# Patient Record
Sex: Female | Born: 1935
Health system: Southern US, Community
[De-identification: ages and names within clinical notes are randomized; demographics above are authoritative.]

## PROBLEM LIST (undated history)

## (undated) ENCOUNTER — Emergency Department (HOSPITAL_COMMUNITY): Payer: PPO

## (undated) DIAGNOSIS — E739 Lactose intolerance, unspecified: Secondary | ICD-10-CM

## (undated) DIAGNOSIS — I872 Venous insufficiency (chronic) (peripheral): Secondary | ICD-10-CM

## (undated) DIAGNOSIS — L259 Unspecified contact dermatitis, unspecified cause: Secondary | ICD-10-CM

## (undated) DIAGNOSIS — E669 Obesity, unspecified: Secondary | ICD-10-CM

## (undated) DIAGNOSIS — N951 Menopausal and female climacteric states: Secondary | ICD-10-CM

## (undated) DIAGNOSIS — M1990 Unspecified osteoarthritis, unspecified site: Secondary | ICD-10-CM

## (undated) DIAGNOSIS — I739 Peripheral vascular disease, unspecified: Secondary | ICD-10-CM

## (undated) DIAGNOSIS — C50919 Malignant neoplasm of unspecified site of unspecified female breast: Secondary | ICD-10-CM

## (undated) DIAGNOSIS — I6529 Occlusion and stenosis of unspecified carotid artery: Secondary | ICD-10-CM

## (undated) DIAGNOSIS — R609 Edema, unspecified: Secondary | ICD-10-CM

## (undated) DIAGNOSIS — M720 Palmar fascial fibromatosis [Dupuytren]: Secondary | ICD-10-CM

## (undated) DIAGNOSIS — E559 Vitamin D deficiency, unspecified: Secondary | ICD-10-CM

## (undated) DIAGNOSIS — E039 Hypothyroidism, unspecified: Secondary | ICD-10-CM

## (undated) DIAGNOSIS — E785 Hyperlipidemia, unspecified: Secondary | ICD-10-CM

## (undated) HISTORY — DX: Edema, unspecified: R60.9

## (undated) HISTORY — PX: TONSILLECTOMY: SUR1361

## (undated) HISTORY — DX: Malignant neoplasm of unspecified site of unspecified female breast: C50.919

## (undated) HISTORY — DX: Lactose intolerance, unspecified: E73.9

## (undated) HISTORY — PX: OTHER SURGICAL HISTORY: SHX169

## (undated) HISTORY — PX: FINGER SURGERY: SHX640

## (undated) HISTORY — DX: Unspecified osteoarthritis, unspecified site: M19.90

## (undated) HISTORY — PX: HERNIA REPAIR: SHX51

## (undated) HISTORY — DX: Hyperlipidemia, unspecified: E78.5

## (undated) HISTORY — PX: CHOLECYSTECTOMY: SHX55

## (undated) HISTORY — PX: INGUINAL HERNIA REPAIR: SHX194

## (undated) HISTORY — DX: Peripheral vascular disease, unspecified: I73.9

## (undated) HISTORY — DX: Hypothyroidism, unspecified: E03.9

## (undated) HISTORY — DX: Obesity, unspecified: E66.9

## (undated) HISTORY — PX: JOINT REPLACEMENT: SHX530

## (undated) HISTORY — DX: Venous insufficiency (chronic) (peripheral): I87.2

## (undated) HISTORY — DX: Menopausal and female climacteric states: N95.1

## (undated) HISTORY — DX: Palmar fascial fibromatosis (dupuytren): M72.0

## (undated) HISTORY — DX: Vitamin D deficiency, unspecified: E55.9

## (undated) HISTORY — DX: Unspecified contact dermatitis, unspecified cause: L25.9

## (undated) HISTORY — PX: SPINE SURGERY: SHX786

## (undated) HISTORY — PX: BREAST LUMPECTOMY: SHX2

## (undated) HISTORY — PX: TOTAL HIP ARTHROPLASTY: SHX124

## (undated) HISTORY — DX: Occlusion and stenosis of unspecified carotid artery: I65.29

---

## 1981-01-11 HISTORY — PX: BACK SURGERY: SHX140

## 1998-02-09 ENCOUNTER — Inpatient Hospital Stay (HOSPITAL_COMMUNITY): Admission: EM | Admit: 1998-02-09 | Discharge: 1998-02-10 | Payer: Self-pay | Admitting: Emergency Medicine

## 1998-02-09 ENCOUNTER — Encounter: Payer: Self-pay | Admitting: Emergency Medicine

## 2000-02-18 ENCOUNTER — Encounter: Admission: RE | Admit: 2000-02-18 | Discharge: 2000-02-18 | Payer: Self-pay | Admitting: Family Medicine

## 2001-03-02 ENCOUNTER — Encounter (INDEPENDENT_AMBULATORY_CARE_PROVIDER_SITE_OTHER): Payer: Self-pay | Admitting: Specialist

## 2001-03-03 ENCOUNTER — Inpatient Hospital Stay (HOSPITAL_COMMUNITY): Admission: EM | Admit: 2001-03-03 | Discharge: 2001-03-04 | Payer: Self-pay | Admitting: Emergency Medicine

## 2001-03-03 ENCOUNTER — Encounter: Payer: Self-pay | Admitting: *Deleted

## 2001-03-03 ENCOUNTER — Encounter: Payer: Self-pay | Admitting: General Surgery

## 2001-04-14 ENCOUNTER — Other Ambulatory Visit: Admission: RE | Admit: 2001-04-14 | Discharge: 2001-04-14 | Payer: Self-pay | Admitting: *Deleted

## 2001-12-28 ENCOUNTER — Observation Stay (HOSPITAL_COMMUNITY): Admission: RE | Admit: 2001-12-28 | Discharge: 2001-12-29 | Payer: Self-pay | Admitting: Surgery

## 2003-11-26 ENCOUNTER — Ambulatory Visit: Payer: Self-pay | Admitting: Internal Medicine

## 2003-11-29 ENCOUNTER — Ambulatory Visit: Payer: Self-pay

## 2003-11-29 HISTORY — PX: OTHER SURGICAL HISTORY: SHX169

## 2003-12-25 ENCOUNTER — Ambulatory Visit: Payer: Self-pay | Admitting: Internal Medicine

## 2004-03-26 ENCOUNTER — Encounter: Admission: RE | Admit: 2004-03-26 | Discharge: 2004-06-24 | Payer: Self-pay | Admitting: Orthopaedic Surgery

## 2004-06-05 ENCOUNTER — Ambulatory Visit: Payer: Self-pay | Admitting: Internal Medicine

## 2004-06-25 ENCOUNTER — Encounter: Admission: RE | Admit: 2004-06-25 | Discharge: 2004-08-26 | Payer: Self-pay | Admitting: Orthopaedic Surgery

## 2004-08-10 ENCOUNTER — Ambulatory Visit: Payer: Self-pay | Admitting: Endocrinology

## 2004-09-03 ENCOUNTER — Encounter: Admission: RE | Admit: 2004-09-03 | Discharge: 2004-09-23 | Payer: Self-pay | Admitting: Orthopedic Surgery

## 2005-02-10 ENCOUNTER — Encounter: Payer: Self-pay | Admitting: Internal Medicine

## 2005-02-10 LAB — CONVERTED CEMR LAB

## 2005-03-25 ENCOUNTER — Other Ambulatory Visit: Admission: RE | Admit: 2005-03-25 | Discharge: 2005-03-25 | Payer: Self-pay | Admitting: Obstetrics and Gynecology

## 2006-05-16 ENCOUNTER — Ambulatory Visit: Payer: Self-pay | Admitting: Internal Medicine

## 2006-05-16 LAB — CONVERTED CEMR LAB
Albumin: 3.7 g/dL (ref 3.5–5.2)
Basophils Absolute: 0 10*3/uL (ref 0.0–0.1)
Basophils Relative: 0.2 % (ref 0.0–1.0)
Bilirubin Urine: NEGATIVE
CO2: 29 meq/L (ref 19–32)
Cholesterol: 202 mg/dL (ref 0–200)
Creatinine, Ser: 0.9 mg/dL (ref 0.4–1.2)
Crystals: NEGATIVE
Eosinophils Absolute: 0.2 10*3/uL (ref 0.0–0.6)
Free T4: 1 ng/dL (ref 0.6–1.6)
HDL: 47.9 mg/dL (ref >39.0)
Hemoglobin: 14.3 g/dL (ref 12.0–15.0)
Hgb A1c MFr Bld: 6.1 % — ABNORMAL HIGH (ref 4.6–6.0)
Ketones, ur: NEGATIVE mg/dL
Lymphocytes Relative: 26.1 % (ref 12.0–46.0)
MCHC: 35 g/dL (ref 30.0–36.0)
MCV: 85.5 fL (ref 78.0–100.0)
Monocytes Absolute: 0.7 10*3/uL (ref 0.2–0.7)
Monocytes Relative: 7.9 % (ref 3.0–11.0)
Neutro Abs: 5.5 10*3/uL (ref 1.4–7.7)
Nitrite: NEGATIVE
Platelets: 349 10*3/uL (ref 150–400)
Potassium: 4.3 meq/L (ref 3.5–5.1)
Sodium: 142 meq/L (ref 135–145)
Specific Gravity, Urine: 1.025 (ref 1.000–1.03)
Total Bilirubin: 1.3 mg/dL — ABNORMAL HIGH (ref 0.3–1.2)
Total Protein: 6.9 g/dL (ref 6.0–8.3)
Urine Glucose: NEGATIVE mg/dL
Urobilinogen, UA: 0.2 (ref 0.0–1.0)

## 2006-05-25 ENCOUNTER — Encounter: Payer: Self-pay | Admitting: Internal Medicine

## 2006-05-25 LAB — CONVERTED CEMR LAB
Amylase: 34 units/L (ref 27–131)
CO2: 31 meq/L (ref 19–32)
Creatinine, Ser: 0.8 mg/dL (ref 0.4–1.2)
Glucose, Bld: 112 mg/dL — ABNORMAL HIGH (ref 70–99)
Lipase: 18 units/L (ref 11.0–59.0)
Potassium: 4.1 meq/L (ref 3.5–5.1)
Sodium: 146 meq/L — ABNORMAL HIGH (ref 135–145)
Total Bilirubin: 1.4 mg/dL — ABNORMAL HIGH (ref 0.3–1.2)
Total Protein: 6.3 g/dL (ref 6.0–8.3)

## 2006-05-26 ENCOUNTER — Ambulatory Visit (HOSPITAL_COMMUNITY): Admission: RE | Admit: 2006-05-26 | Discharge: 2006-05-26 | Payer: Self-pay | Admitting: Internal Medicine

## 2006-05-30 ENCOUNTER — Encounter: Payer: Self-pay | Admitting: Internal Medicine

## 2006-09-29 ENCOUNTER — Ambulatory Visit: Payer: Self-pay | Admitting: Internal Medicine

## 2006-09-29 ENCOUNTER — Ambulatory Visit (HOSPITAL_COMMUNITY): Admission: RE | Admit: 2006-09-29 | Discharge: 2006-09-29 | Payer: Self-pay | Admitting: Orthopaedic Surgery

## 2006-11-03 ENCOUNTER — Ambulatory Visit: Payer: Self-pay | Admitting: Internal Medicine

## 2006-11-03 LAB — CONVERTED CEMR LAB
BUN: 14 mg/dL (ref 6–23)
Basophils Absolute: 0 10*3/uL (ref 0.0–0.1)
CO2: 31 meq/L (ref 19–32)
Calcium: 9.5 mg/dL (ref 8.4–10.5)
Creatinine, Ser: 0.7 mg/dL (ref 0.4–1.2)
Crystals: NEGATIVE
Free T4: 0.8 ng/dL (ref 0.6–1.6)
Glucose, Bld: 99 mg/dL (ref 70–99)
Hemoglobin: 13.7 g/dL (ref 12.0–15.0)
MCHC: 34.2 g/dL (ref 30.0–36.0)
Monocytes Absolute: 0.9 10*3/uL — ABNORMAL HIGH (ref 0.2–0.7)
Monocytes Relative: 10.3 % (ref 3.0–11.0)
Mucus, UA: NEGATIVE
RBC: 4.58 M/uL (ref 3.87–5.11)
RDW: 13.2 % (ref 11.5–14.6)
T3, Free: 4.2 pg/mL (ref 2.3–4.2)
TSH: 3.25 microintl units/mL (ref 0.35–5.50)
Total Protein, Urine: NEGATIVE mg/dL
Urobilinogen, UA: 0.2 (ref 0.0–1.0)

## 2006-11-04 ENCOUNTER — Telehealth: Payer: Self-pay | Admitting: Internal Medicine

## 2006-11-05 ENCOUNTER — Encounter: Payer: Self-pay | Admitting: Internal Medicine

## 2006-11-05 DIAGNOSIS — E039 Hypothyroidism, unspecified: Secondary | ICD-10-CM | POA: Insufficient documentation

## 2006-11-05 DIAGNOSIS — M1990 Unspecified osteoarthritis, unspecified site: Secondary | ICD-10-CM | POA: Insufficient documentation

## 2006-12-02 ENCOUNTER — Telehealth: Payer: Self-pay | Admitting: Internal Medicine

## 2006-12-02 ENCOUNTER — Encounter: Payer: Self-pay | Admitting: Internal Medicine

## 2006-12-12 ENCOUNTER — Telehealth: Payer: Self-pay | Admitting: Internal Medicine

## 2006-12-12 ENCOUNTER — Ambulatory Visit: Payer: Self-pay | Admitting: Internal Medicine

## 2006-12-12 LAB — CONVERTED CEMR LAB: Vit D, 1,25-Dihydroxy: 27 — ABNORMAL LOW (ref 30–89)

## 2006-12-13 LAB — CONVERTED CEMR LAB
Cholesterol: 183 mg/dL (ref 0–200)
LDL Cholesterol: 111 mg/dL — ABNORMAL HIGH (ref 0–99)

## 2006-12-26 ENCOUNTER — Ambulatory Visit: Payer: Self-pay | Admitting: Internal Medicine

## 2006-12-26 DIAGNOSIS — E559 Vitamin D deficiency, unspecified: Secondary | ICD-10-CM | POA: Insufficient documentation

## 2006-12-26 DIAGNOSIS — E669 Obesity, unspecified: Secondary | ICD-10-CM | POA: Insufficient documentation

## 2007-03-07 ENCOUNTER — Telehealth: Payer: Self-pay | Admitting: Internal Medicine

## 2007-03-07 ENCOUNTER — Ambulatory Visit: Payer: Self-pay | Admitting: Internal Medicine

## 2007-03-07 LAB — CONVERTED CEMR LAB
Nitrite: NEGATIVE
Specific Gravity, Urine: 1.025 (ref 1.000–1.03)
Urobilinogen, UA: 0.2 (ref 0.0–1.0)
pH: 5.5 (ref 5.0–8.0)

## 2007-03-16 ENCOUNTER — Telehealth (INDEPENDENT_AMBULATORY_CARE_PROVIDER_SITE_OTHER): Payer: Self-pay | Admitting: *Deleted

## 2007-03-20 ENCOUNTER — Ambulatory Visit: Payer: Self-pay | Admitting: Internal Medicine

## 2007-03-20 LAB — CONVERTED CEMR LAB
ALT: 18 units/L (ref 0–35)
AST: 16 units/L (ref 0–37)
Crystals: NEGATIVE
Direct LDL: 89 mg/dL
Nitrite: NEGATIVE
RBC / HPF: NONE SEEN
TSH: 2.11 microintl units/mL (ref 0.35–5.50)
Triglycerides: 237 mg/dL (ref 0–149)
Urine Glucose: NEGATIVE mg/dL
Urobilinogen, UA: 0.2 (ref 0.0–1.0)

## 2007-03-21 ENCOUNTER — Ambulatory Visit: Payer: Self-pay | Admitting: Internal Medicine

## 2007-03-27 ENCOUNTER — Encounter: Payer: Self-pay | Admitting: Internal Medicine

## 2007-03-27 ENCOUNTER — Ambulatory Visit: Payer: Self-pay | Admitting: Family Medicine

## 2007-06-12 LAB — CONVERTED CEMR LAB: Pap Smear: NORMAL

## 2007-08-01 ENCOUNTER — Telehealth (INDEPENDENT_AMBULATORY_CARE_PROVIDER_SITE_OTHER): Payer: Self-pay | Admitting: *Deleted

## 2007-08-04 ENCOUNTER — Ambulatory Visit: Payer: Self-pay | Admitting: Internal Medicine

## 2007-08-04 LAB — CONVERTED CEMR LAB: CRP, High Sensitivity: 12 — ABNORMAL HIGH (ref 0.00–5.00)

## 2007-08-05 ENCOUNTER — Emergency Department (HOSPITAL_COMMUNITY): Admission: EM | Admit: 2007-08-05 | Discharge: 2007-08-06 | Payer: Self-pay | Admitting: Emergency Medicine

## 2007-08-07 ENCOUNTER — Ambulatory Visit: Payer: Self-pay | Admitting: Cardiology

## 2007-08-08 ENCOUNTER — Encounter: Payer: Self-pay | Admitting: Internal Medicine

## 2007-08-10 ENCOUNTER — Ambulatory Visit: Payer: Self-pay | Admitting: Internal Medicine

## 2007-08-10 DIAGNOSIS — I872 Venous insufficiency (chronic) (peripheral): Secondary | ICD-10-CM | POA: Insufficient documentation

## 2007-08-10 DIAGNOSIS — E785 Hyperlipidemia, unspecified: Secondary | ICD-10-CM | POA: Insufficient documentation

## 2007-08-10 DIAGNOSIS — I739 Peripheral vascular disease, unspecified: Secondary | ICD-10-CM | POA: Insufficient documentation

## 2007-08-10 DIAGNOSIS — E739 Lactose intolerance, unspecified: Secondary | ICD-10-CM | POA: Insufficient documentation

## 2007-08-10 LAB — CONVERTED CEMR LAB
CO2: 30 meq/L (ref 19–32)
Chloride: 100 meq/L (ref 96–112)
Magnesium: 2.2 mg/dL (ref 1.5–2.5)
Sodium: 138 meq/L (ref 135–145)

## 2007-08-16 ENCOUNTER — Ambulatory Visit: Payer: Self-pay

## 2007-08-16 ENCOUNTER — Encounter: Payer: Self-pay | Admitting: Cardiology

## 2007-08-29 ENCOUNTER — Ambulatory Visit: Payer: Self-pay | Admitting: Cardiology

## 2007-09-27 ENCOUNTER — Telehealth: Payer: Self-pay | Admitting: Internal Medicine

## 2007-09-27 DIAGNOSIS — N951 Menopausal and female climacteric states: Secondary | ICD-10-CM | POA: Insufficient documentation

## 2007-10-31 ENCOUNTER — Ambulatory Visit: Payer: Self-pay | Admitting: Gastroenterology

## 2007-11-07 ENCOUNTER — Telehealth: Payer: Self-pay | Admitting: Gastroenterology

## 2007-11-10 ENCOUNTER — Ambulatory Visit: Payer: Self-pay | Admitting: Gastroenterology

## 2007-11-10 ENCOUNTER — Encounter: Payer: Self-pay | Admitting: Gastroenterology

## 2007-11-13 ENCOUNTER — Encounter: Payer: Self-pay | Admitting: Gastroenterology

## 2007-11-14 ENCOUNTER — Ambulatory Visit: Payer: Self-pay | Admitting: Internal Medicine

## 2007-11-14 DIAGNOSIS — F411 Generalized anxiety disorder: Secondary | ICD-10-CM

## 2007-11-14 HISTORY — DX: Generalized anxiety disorder: F41.1

## 2008-03-16 DIAGNOSIS — I6529 Occlusion and stenosis of unspecified carotid artery: Secondary | ICD-10-CM | POA: Insufficient documentation

## 2008-03-18 ENCOUNTER — Ambulatory Visit: Payer: Self-pay | Admitting: Cardiology

## 2008-03-18 ENCOUNTER — Encounter: Payer: Self-pay | Admitting: Cardiology

## 2008-03-29 ENCOUNTER — Ambulatory Visit: Payer: Self-pay | Admitting: Internal Medicine

## 2008-03-29 ENCOUNTER — Telehealth (INDEPENDENT_AMBULATORY_CARE_PROVIDER_SITE_OTHER): Payer: Self-pay | Admitting: *Deleted

## 2008-03-29 DIAGNOSIS — M549 Dorsalgia, unspecified: Secondary | ICD-10-CM | POA: Insufficient documentation

## 2008-03-29 DIAGNOSIS — S40029A Contusion of unspecified upper arm, initial encounter: Secondary | ICD-10-CM | POA: Insufficient documentation

## 2008-03-29 LAB — CONVERTED CEMR LAB
ALT: 17 units/L (ref 0–35)
AST: 22 units/L (ref 0–37)
Alkaline Phosphatase: 70 units/L (ref 39–117)
Basophils Absolute: 0 10*3/uL (ref 0.0–0.1)
Bilirubin, Direct: 0.2 mg/dL (ref 0.0–0.3)
CO2: 30 meq/L (ref 19–32)
Calcium: 9.2 mg/dL (ref 8.4–10.5)
Chloride: 107 meq/L (ref 96–112)
Eosinophils Relative: 3.4 % (ref 0.0–5.0)
Glucose, Bld: 86 mg/dL (ref 70–99)
HCT: 37 % (ref 36.0–46.0)
HDL: 53.8 mg/dL (ref >39.00)
Ketones, ur: NEGATIVE mg/dL
LDL Cholesterol: 111 mg/dL — ABNORMAL HIGH (ref 0–99)
Lymphocytes Relative: 27.6 % (ref 12.0–46.0)
Monocytes Relative: 12.4 % — ABNORMAL HIGH (ref 3.0–12.0)
Neutrophils Relative %: 56 % (ref 43.0–77.0)
Platelets: 258 10*3/uL (ref 150.0–400.0)
Sodium: 144 meq/L (ref 135–145)
Specific Gravity, Urine: 1.015 (ref 1.000–1.030)
Total Bilirubin: 1.2 mg/dL (ref 0.3–1.2)
Total CHOL/HDL Ratio: 3
Urobilinogen, UA: 0.2 (ref 0.0–1.0)
VLDL: 15.4 mg/dL (ref 0.0–40.0)
WBC: 7.1 10*3/uL (ref 4.5–10.5)

## 2008-04-26 ENCOUNTER — Telehealth: Payer: Self-pay | Admitting: Cardiology

## 2008-05-01 ENCOUNTER — Ambulatory Visit: Payer: Self-pay | Admitting: Cardiology

## 2008-05-01 ENCOUNTER — Encounter: Payer: Self-pay | Admitting: Physician Assistant

## 2008-05-01 DIAGNOSIS — R609 Edema, unspecified: Secondary | ICD-10-CM | POA: Insufficient documentation

## 2008-05-07 ENCOUNTER — Telehealth: Payer: Self-pay | Admitting: Cardiology

## 2008-05-24 ENCOUNTER — Inpatient Hospital Stay (HOSPITAL_COMMUNITY): Admission: RE | Admit: 2008-05-24 | Discharge: 2008-05-28 | Payer: Self-pay | Admitting: Orthopedic Surgery

## 2008-07-01 ENCOUNTER — Telehealth (INDEPENDENT_AMBULATORY_CARE_PROVIDER_SITE_OTHER): Payer: Self-pay | Admitting: *Deleted

## 2008-07-18 ENCOUNTER — Ambulatory Visit (HOSPITAL_COMMUNITY): Admission: RE | Admit: 2008-07-18 | Discharge: 2008-07-18 | Payer: Self-pay | Admitting: Orthopedic Surgery

## 2008-07-18 ENCOUNTER — Encounter (INDEPENDENT_AMBULATORY_CARE_PROVIDER_SITE_OTHER): Payer: Self-pay | Admitting: Orthopedic Surgery

## 2008-07-18 ENCOUNTER — Ambulatory Visit: Payer: Self-pay | Admitting: Surgery

## 2008-10-07 ENCOUNTER — Encounter: Payer: Self-pay | Admitting: Internal Medicine

## 2008-10-28 ENCOUNTER — Telehealth: Payer: Self-pay | Admitting: Cardiology

## 2008-11-13 ENCOUNTER — Inpatient Hospital Stay (HOSPITAL_COMMUNITY): Admission: RE | Admit: 2008-11-13 | Discharge: 2008-11-16 | Payer: Self-pay | Admitting: Orthopedic Surgery

## 2008-12-20 ENCOUNTER — Encounter: Payer: Self-pay | Admitting: Cardiology

## 2009-04-02 ENCOUNTER — Telehealth: Payer: Self-pay | Admitting: Cardiology

## 2009-04-14 ENCOUNTER — Encounter: Payer: Self-pay | Admitting: Cardiology

## 2009-04-15 ENCOUNTER — Ambulatory Visit: Payer: Self-pay

## 2009-04-15 ENCOUNTER — Encounter: Payer: Self-pay | Admitting: Cardiology

## 2009-06-02 ENCOUNTER — Ambulatory Visit: Payer: Self-pay | Admitting: Internal Medicine

## 2009-06-02 ENCOUNTER — Encounter: Payer: Self-pay | Admitting: Internal Medicine

## 2009-06-02 LAB — CONVERTED CEMR LAB
Bilirubin Urine: NEGATIVE
Total Protein, Urine: NEGATIVE mg/dL
Urine Glucose: NEGATIVE mg/dL
pH: 6 (ref 5.0–8.0)

## 2009-06-13 ENCOUNTER — Ambulatory Visit: Payer: Self-pay | Admitting: Internal Medicine

## 2009-06-13 ENCOUNTER — Encounter: Payer: Self-pay | Admitting: Internal Medicine

## 2009-06-13 DIAGNOSIS — R5383 Other fatigue: Secondary | ICD-10-CM

## 2009-06-13 DIAGNOSIS — R5381 Other malaise: Secondary | ICD-10-CM | POA: Insufficient documentation

## 2009-06-14 LAB — CONVERTED CEMR LAB: Vit D, 25-Hydroxy: 27 ng/mL — ABNORMAL LOW (ref 30–89)

## 2009-06-16 LAB — CONVERTED CEMR LAB
ALT: 24 units/L (ref 0–35)
AST: 30 units/L (ref 0–37)
Alkaline Phosphatase: 74 units/L (ref 39–117)
BUN: 27 mg/dL — ABNORMAL HIGH (ref 6–23)
Basophils Relative: 0.3 % (ref 0.0–3.0)
Bilirubin Urine: NEGATIVE
Bilirubin, Direct: 0.3 mg/dL (ref 0.0–0.3)
Chloride: 104 meq/L (ref 96–112)
Cholesterol: 177 mg/dL (ref 0–200)
Creatinine, Ser: 1.3 mg/dL — ABNORMAL HIGH (ref 0.4–1.2)
Direct LDL: 95.5 mg/dL
Eosinophils Relative: 3.3 % (ref 0.0–5.0)
Folate: 9 ng/mL
GFR calc non Af Amer: 43.38 mL/min (ref >60)
HCT: 39 % (ref 36.0–46.0)
HDL: 58.9 mg/dL (ref >39.00)
Ketones, ur: NEGATIVE mg/dL
MCV: 87.1 fL (ref 78.0–100.0)
Monocytes Absolute: 1.2 10*3/uL — ABNORMAL HIGH (ref 0.1–1.0)
Monocytes Relative: 10.1 % (ref 3.0–12.0)
Neutrophils Relative %: 67.7 % (ref 43.0–77.0)
Platelets: 291 10*3/uL (ref 150.0–400.0)
Potassium: 3.9 meq/L (ref 3.5–5.1)
RBC: 4.48 M/uL (ref 3.87–5.11)
Total Bilirubin: 1 mg/dL (ref 0.3–1.2)
Total Protein, Urine: NEGATIVE mg/dL
Total Protein: 6.6 g/dL (ref 6.0–8.3)
Triglycerides: 233 mg/dL — ABNORMAL HIGH (ref 0.0–149.0)
Urine Glucose: NEGATIVE mg/dL
Urobilinogen, UA: 0.2 (ref 0.0–1.0)
VLDL: 46.6 mg/dL — ABNORMAL HIGH (ref 0.0–40.0)
Vitamin B-12: 328 pg/mL (ref 211–911)
WBC: 12.2 10*3/uL — ABNORMAL HIGH (ref 4.5–10.5)

## 2009-10-20 ENCOUNTER — Ambulatory Visit: Payer: Self-pay | Admitting: Cardiology

## 2009-11-06 ENCOUNTER — Telehealth (INDEPENDENT_AMBULATORY_CARE_PROVIDER_SITE_OTHER): Payer: Self-pay | Admitting: *Deleted

## 2009-11-06 ENCOUNTER — Ambulatory Visit: Payer: Self-pay | Admitting: Internal Medicine

## 2009-11-06 DIAGNOSIS — R519 Headache, unspecified: Secondary | ICD-10-CM | POA: Insufficient documentation

## 2009-11-06 DIAGNOSIS — R51 Headache: Secondary | ICD-10-CM | POA: Insufficient documentation

## 2009-11-06 LAB — CONVERTED CEMR LAB
BUN: 21 mg/dL (ref 6–23)
Basophils Relative: 0.1 % (ref 0.0–3.0)
Eosinophils Relative: 0.6 % (ref 0.0–5.0)
GFR calc non Af Amer: 43.34 mL/min (ref >60)
HCT: 39.2 % (ref 36.0–46.0)
Lymphs Abs: 1.2 10*3/uL (ref 0.7–4.0)
Monocytes Relative: 15.7 % — ABNORMAL HIGH (ref 3.0–12.0)
Neutrophils Relative %: 72.2 % (ref 43.0–77.0)
Nitrite: NEGATIVE
Platelets: 287 10*3/uL (ref 150.0–400.0)
Potassium: 3.8 meq/L (ref 3.5–5.1)
RBC: 4.41 M/uL (ref 3.87–5.11)
Specific Gravity, Urine: 1.02 (ref 1.000–1.030)
TSH: 0.55 microintl units/mL (ref 0.35–5.50)
Total Protein, Urine: NEGATIVE mg/dL
WBC: 10.3 10*3/uL (ref 4.5–10.5)
pH: 6.5 (ref 5.0–8.0)

## 2010-02-01 ENCOUNTER — Encounter: Payer: Self-pay | Admitting: Internal Medicine

## 2010-02-10 NOTE — Assessment & Plan Note (Signed)
Summary: r lwr back pain,?bladder inf/john/cd   Vital Signs:  Patient profile:   77 year old female Height:      69 inches (175.26 cm) Weight:      258.8 pounds (117.64 kg) BMI:     38.36 O2 Sat:      93 % on Room air Temp:     98.4 degrees F (36.89 degrees C) oral Pulse rate:   63 / minute BP sitting:   114 / 62  (left arm) Cuff size:   large  Vitals Entered By: Orlan Leavens (Jun 02, 2009 2:56 PM)  O2 Flow:  Room air CC: Low back pain (R) side x's 1 week, Back pain Is Patient Diabetic? No Pain Assessment Patient in pain? yes     Location: lower back Type: aching   Primary Care Provider:  Corwin Levins MD  CC:  Low back pain (R) side x's 1 week and Back pain.  History of Present Illness:  Back Pain      This is a 77 year old woman who presents with Back pain.  The symptoms began 1 week ago.  The intensity is described as moderate.  The patient reports rest pain, but denies fever, chills, weakness, loss of sensation, fecal incontinence, dysuria, and inability to work.  The pain is located in the right low back and right SI joint.  The pain began at home and gradually.  The pain radiates to the right buttock.  The pain is made worse by inactivity.  The pain is made better by activity.  Risk factors for serious underlying conditions include bedrest with no relief and age >= 50 years.    Clinical Review Panels:  Prevention   Last Mammogram:  normal (01/30/2007)   Last Pap Smear:  normal (06/12/2007)   Last Colonoscopy:  abnormal (11/10/2007)  Immunizations   Last Tetanus Booster:  Tdap (11/14/2007)   Last Flu Vaccine:  declined (11/14/2007)   Last Pneumovax:  declined (12/26/2006)  Lipid Management   Cholesterol:  180 (03/29/2008)   LDL (bad choesterol):  111 (03/29/2008)   HDL (good cholesterol):  53.80 (03/29/2008)  Diabetes Management   HgBA1C:  6.0 (03/29/2008)   Creatinine:  1.0 (03/29/2008)   Last Flu Vaccine:  declined (11/14/2007)   Last Pneumovax:  declined  (12/26/2006)  CBC   WBC:  7.1 (03/29/2008)   RBC:  4.21 (03/29/2008)   Hgb:  12.5 (03/29/2008)   Hct:  37.0 (03/29/2008)   Platelets:  258.0 (03/29/2008)   MCV  87.9 (03/29/2008)   MCHC  33.9 (03/29/2008)   RDW  12.3 (03/29/2008)   PMN:  56.0 (03/29/2008)   Lymphs:  27.6 (03/29/2008)   Monos:  12.4 (03/29/2008)   Eosinophils:  3.4 (03/29/2008)   Basophil:  0.6 (03/29/2008)  Complete Metabolic Panel   Glucose:  86 (03/29/2008)   Sodium:  144 (03/29/2008)   Potassium:  4.2 (03/29/2008)   Chloride:  107 (03/29/2008)   CO2:  30 (03/29/2008)   BUN:  21 (03/29/2008)   Creatinine:  1.0 (03/29/2008)   Albumin:  3.8 (03/29/2008)   Total Protein:  6.6 (03/29/2008)   Calcium:  9.2 (03/29/2008)   Total Bili:  1.2 (03/29/2008)   Alk Phos:  70 (03/29/2008)   SGPT (ALT):  17 (03/29/2008)   SGOT (AST):  22 (03/29/2008)   Current Medications (verified): 1)  Levothroid 137 Mcg Tabs (Levothyroxine Sodium) .Marland Kitchen.. 1 By Mouth Once Daily 2)  Vitamin C .... One By Mouth Daily 3)  Vitamin  E .... One By Mouth Daily 4)  Multivitamin .... One By Mouth Daily 5)  Co Q 10 .... One By Mouth Daily 6)  Immune Vitamin .... One By Mouth Daily 7)  Red Yeast Rice .... Onepo Daily 8)  Omega 3 .... One By Mouth Daily 9)  Glucosamine Chondrontin .... One By Mouth Daily 10)  Green Tea Extract .... One By Mouth Daily 11)  Potassium Chloride Crys Cr 20 Meq Cr-Tabs (Potassium Chloride Crys Cr) .... Take One Tablet By Mouth Daily 12)  Triamterene-Hctz 37.5-25 Mg Tabs (Triamterene-Hctz) .... Take 1 By Mouth Once Daily  Allergies (verified): 1)  ! * Shellfish  Past History:  Past Medical History: Hypothyroidism - dr Cherlyn Roberts in [place] endo Osteoarthritis - bilat hips, severe Obesity 2-D echo 11/29/2003 - normal LV size. Normal ejection fraction Chronic venous insufficiency Hyperlipidemia Glucose intolerance Normal coronary arteries per pt - 2003 Peripheral vascular disease - mild carotid  11/05 Anxiety  MD rooster - ortho - allusio -  Past Surgical History: Cholecystectomy Inguinal herniorrhaphy Tonsillectomy Back surgury - 1983 Right rotater cuff surgury both hip thr 05/2008 & 11/2008  Review of Systems  The patient denies fever, hematuria, incontinence, muscle weakness, and difficulty walking.    Physical Exam  General:  alert, well-developed, well-nourished, and cooperative to examination.    Lungs:  normal respiratory effort, no intercostal retractions or use of accessory muscles; normal breath sounds bilaterally - no crackles and no wheezes.    Heart:  normal rate, regular rhythm, no murmur, and no rub. BLE without edema. normal DP pulses and normal cap refill in all 4 extremities    Msk:  back: full range of motion of lumbar spine. Nontender to palpation of vertebra or SI or lateral hip. Negative bilateral straight leg raise. Deep tendon reflexes symmetrically intact at Achilles and patella, negative clonus. Sensation intact throughout all dermatomes in bilateral lower extremities. Full strength to manual muscle testing in all major muscule groups including EHL, anterior tibialis, gastrocnemius, quadriceps, and iliopsoas. Able to heel and toe walk without difficulty and ambulates with a normal gait.    Impression & Recommendations:  Problem # 1:  BACK PAIN (ICD-724.5) ?mskel vs urinary source given hx same - check xray and UA discussed poss use pred pak and muscle relaxants - pt declines both but will use OTC aleve as needed  further tx may change depending on tets results - prt understands and agrees, will call if symptoms worse or unresolved Orders: T-Lumbar Spine 2 Views (72100TC) TLB-Udip w/ Micro (81001-URINE)  Complete Medication List: 1)  Levothroid 137 Mcg Tabs (Levothyroxine sodium) .Marland Kitchen.. 1 by mouth once daily 2)  Vitamin C  .... One by mouth daily 3)  Vitamin E  .... One by mouth daily 4)  Multivitamin  .... One by mouth daily 5)  Co Q 10  ....  One by mouth daily 6)  Immune Vitamin  .... One by mouth daily 7)  Red Yeast Rice  .... Onepo daily 8)  Omega 3  .... One by mouth daily 9)  Glucosamine Chondrontin  .... One by mouth daily 10)  Green Tea Extract  .... One by mouth daily 11)  Potassium Chloride Crys Cr 20 Meq Cr-tabs (Potassium chloride crys cr) .... Take one tablet by mouth daily 12)  Triamterene-hctz 37.5-25 Mg Tabs (Triamterene-hctz) .... Take 1 by mouth once daily  Patient Instructions: 1)  it was good to meet you today.  2)  test(s) ordered today - your results will  be called to you in 24-48 hours from the time of test completion - if referrls or medications are needed you will be notified of same at that time- 3)  Take 650-1000mg  of Tylenol every 4-6 hours as needed for relief of pain or comfort of fever - AVOID taking more than 4000mg   in a 24 hour period (can cause liver damage in higher doses). 4)  may also take Alleve 1-2 every 12 hours as needed for back pain symptoms  5)  consider robaxin (muscle relaxant) that does not cause confusion or sedation as needed -  6)  Please schedule a follow-up appointment for physical and labs as planned, call sooner if problems.

## 2010-02-10 NOTE — Progress Notes (Signed)
----   Converted from flag ---- ---- 11/06/2009 8:18 AM, Newt Lukes MD wrote: ok to schedule with me - but after this AM, i will be out of the office until Tues 11/1 - thanks  ---- 11/06/2009 7:47 AM, Ivar Bury wrote: She wants the appt w/Dr Felicity Coyer--  ---- 11/05/2009 6:49 PM, Corwin Levins MD wrote: with whom does she want the appt?  please clarify  ---- 11/05/2009 11:24 AM, Ivar Bury wrote: Dr Jonny Ruiz,  Your pt Herbert Pun request to switch to Dr Felicity Coyer.  She is requesting an appt because she is weak and have a terrible headache.  Thank you for your reply. ------------------------------ Gave pt appt:  11/06/09 @ 10a--phone

## 2010-02-10 NOTE — Assessment & Plan Note (Signed)
Summary: [facility] /[facility]   Vital Signs:  Patient profile:   77 year old female Height:      69 inches Weight:      254 pounds BMI:     37.64 O2 Sat:      96 % on Room air Temp:     98 degrees F oral Pulse rate:   76 / minute BP sitting:   140 / 64  (left arm) Cuff size:   large  Vitals Entered ByMarland Kitchen Zella Ball Ewing (June 13, 2009 3:45 PM)  O2 Flow:  Room air  Preventive Care Screening  Mammogram:    Date:  03/11/2009    Results:  normal      pt affirms all above today  CC: [facility]/RE   Primary Care Provider:  Corwin Levins MD  CC:  [facility]/RE.  History of Present Illness: here wtih acute onset x 2 -3 days rash to the medial thighs with itching and seems to be spreading to the right wrist , after working in the yard.  No fever, pain, tongue swelling, sob or wheezing.  Pt denies CP, sob, doe, wheezing, orthopnea, pnd, worsening LE edema, palps, dizziness or syncope   Pt denies new neuro symptoms such as headache, facial or extremity weakness   Overall good complaicne with meds, and good tolerance as well.   Here for wellness Diet: Heart Healthy or DM if diabetic Physical Activities: dance twice per wk, water aerobic and excercise 5 times per wk at the gym Depression/mood screen: Negative Hearing: Intact bilateral Visual Acuity: Grossly normal, gets exam yearly ADL's: Capable  Fall Risk: None Home Safety: Good Cognitive Impairment:  Gen appearance, affect, speech, memory, attention & motor skills grossly intact End-of-Life Planning: Advance directive - Full code/I agree , to do living will and POA soon  Preventive Screening-Counseling & Management      Drug Use:  no.    Problems Prior to Update: 1)  Fatigue  (ICD-780.79) 2)  Contact Dermatitis  (ICD-692.9) 3)  Edema  (ICD-782.3) 4)  Contusion of Upper Arm  (ICD-923.03) 5)  Preoperative Examination  (ICD-V72.84) 6)  Back Pain  (ICD-724.5) 7)  Dyspnea  (ICD-786.05) 8)  Pre-operative Cardiovascular Examination   (ICD-V72.81) 9)  Carotid Stenosis  (ICD-433.10) 10)  Anxiety  (ICD-300.00) 11)  Menopausal Syndrome  (ICD-627.2) 12)  Venous Insufficiency, Chronic  (ICD-459.81) 13)  Peripheral Vascular Disease  (ICD-443.9) 14)  Glucose Intolerance  (ICD-271.3) 15)  Hyperlipidemia  (ICD-272.4) 16)  Leg Cramps  (ICD-729.82) 17)  Dyslipidemia  (ICD-272.4) 18)  Unspecified Disorder of Urethra&urinary Tract  (ICD-599.9) 19)  Vitamin D Deficiency  (ICD-268.9) 20)  Family History of Cad Female 1st Degree Relative <60  (ICD-V16.49) 21)  Obesity  (ICD-278.00) 22)  Dupuytren's Contracture  (ICD-728.6) 23)  Unspecified Myalgia and Myositis  (ICD-729.1) 24)  Osteoarthritis  (ICD-715.90) 25)  Hypothyroidism  (ICD-244.9)  Medications Prior to Update: 1)  Levothroid 137 Mcg Tabs (Levothyroxine Sodium) .Marland Kitchen.. 1 By Mouth Once Daily 2)  Vitamin C .... One By Mouth Daily 3)  Vitamin E .... One By Mouth Daily 4)  Multivitamin .... One By Mouth Daily 5)  Co Q 10 .... One By Mouth Daily 6)  Immune Vitamin .... One By Mouth Daily 7)  Red Yeast Rice .... Onepo Daily 8)  Omega 3 .... One By Mouth Daily 9)  Glucosamine Chondrontin .... One By Mouth Daily 10)  Green Tea Extract .... One By Mouth Daily 11)  Potassium Chloride Crys  Cr 20 Meq Cr-Tabs (Potassium Chloride Crys Cr) .... Take One Tablet By Mouth Daily 12)  Triamterene-Hctz 37.5-25 Mg Tabs (Triamterene-Hctz) .... Take 1 By Mouth Once Daily  Current Medications (verified): 1)  Levothroid 137 Mcg Tabs (Levothyroxine Sodium) .Marland Kitchen.. 1 By Mouth Once Daily 2)  Vitamin C .... One By Mouth Daily 3)  Vitamin E .... One By Mouth Daily 4)  Multivitamin .... One By Mouth Daily 5)  Co Q 10 .... One By Mouth Daily 6)  Immune Vitamin .... One By Mouth Daily 7)  Red Yeast Rice .... Onepo Daily 8)  Omega 3 .... One By Mouth Daily 9)  Glucosamine Chondrontin .... One By Mouth Daily 10)  Green Tea Extract .... One By Mouth Daily 11)  Potassium Chloride Crys Cr 20 Meq Cr-Tabs  (Potassium Chloride Crys Cr) .... Take One Tablet By Mouth Daily 12)  Triamterene-Hctz 37.5-25 Mg Tabs (Triamterene-Hctz) .... Take 1 By Mouth Once Daily 13)  Hydroxyzine Hcl 25 Mg Tabs (Hydroxyzine Hcl) .Marland Kitchen.. 1 - 2 By Mouth Q 6 Hrs As Needed 14)  Triamcinolone Acetonide 0.5 % Crea (Triamcinolone Acetonide) .... Use Asd Two Times A Day As Needed  Allergies (verified): 1)  ! * Shellfish  Past History:  Family History: Last updated: 03/16/2008 Father with MI at 43 yo, brother with CABG age 57 Family History of CAD Female 1st degree relative <60 Aunt with hypothyroidism  Social History: Last updated: 06/13/2009 Divorced Never Smoked Alcohol use-no Work - Air[employer] pilot Lives with 23 yo mother Drug use-no  Risk Factors: Smoking Status: never (12/26/2006)  Past Medical History: Hypothyroidism - dr Cherlyn Roberts in [place] endo Osteoarthritis - bilat hips, severe Obesity 2-D echo 11/29/2003 - normal LV size. Normal ejection fraction Chronic venous insufficiency Hyperlipidemia Glucose intolerance Normal coronary arteries per pt - 2003 Peripheral vascular disease - mild carotid 11/05 Anxiety  MD roster - ortho - allusio -  Past Surgical History: Cholecystectomy Inguinal herniorrhaphy Tonsillectomy Back surgury - 1983 Right rotater cuff surgury both hip thr 05/2008 & 11/2008 - Dr Lequita Halt  Family History: Reviewed history from 03/16/2008 and no changes required. Father with MI at 18 yo, brother with CABG age 30 Family History of CAD Female 1st degree relative <60 Aunt with hypothyroidism  Social History: Reviewed history from 03/16/2008 and no changes required. Divorced Never Smoked Alcohol use-no Work - Airline pilot Lives with 20 yo mother Drug use-no Drug Use:  no  Review of Systems  The patient denies anorexia, fever, vision loss, decreased hearing, hoarseness, chest pain, syncope, dyspnea on exertion, peripheral edema, prolonged cough, headaches, hemoptysis, abdominal pain,  melena, hematochezia, severe indigestion/heartburn, hematuria, muscle weakness, suspicious skin lesions, transient blindness, difficulty walking, depression, unusual weight change, abnormal bleeding, enlarged lymph nodes, and angioedema.         all otherwise negative per pt -  some mild fatigue but no OSA symtpoms and overall very active as above., no poydipsia or polyuria  Physical Exam  General:  alert and overweight-appearing.   Head:  normocephalic and atraumatic.   Eyes:  vision grossly intact, pupils equal, and pupils round.   Ears:  R ear normal and L ear normal.   Nose:  no external deformity and no nasal discharge.   Mouth:  no gingival abnormalities and pharynx pink and moist.   Neck:  supple and no masses.   Lungs:  normal respiratory effort and normal breath sounds.   Heart:  normal rate and regular rhythm.   Abdomen:  soft, non-tender, and normal bowel  sounds.   Msk:  no joint tenderness and no joint swelling.   Extremities:  no edema, no erythema  Neurologic:  cranial nerves II-XII intact and strength normal in all extremities.   Skin:  bialt inner thigh with "kissing" streak like erythema areas wihtout tender but with itiching, simlar lesion to right wrist as well approx 1/2 cm area Psych:  not anxious appearing and not depressed appearing.     Impression & Recommendations:  Problem # 1:  Preventive Health Care (ICD-V70.0)  Overall doing well, age appropriate education and counseling updated and referral for appropriate preventive services done unless declined, immunizations up to date or declined, diet counseling done if overweight, urged to quit smoking if smokes , most recent labs reviewed and current ordered if appropriate, ecg reviewed or declined (interpretation per ECG scanned in the EMR if done); information regarding Medicare Prevention requirements given if appropriate; speciality referrals updated as appropriate   Orders: First annual wellness visit with  prevention plan  (Z6109)  Problem # 2:  CONTACT DERMATITIS (ICD-692.9)  Her updated medication list for this problem includes:    Triamcinolone Acetonide 0.5 % Crea (Triamcinolone acetonide) ..... Use asd two times a day as needed treat as above, f/u any worsening signs or symptoms   Orders: Prescription Created Electronically [phone])  Problem # 3:  GLUCOSE INTOLERANCE (ICD-271.3) asympt  - to check a1c  Problem # 4:  HYPERLIPIDEMIA (ICD-272.4)  Orders: TLB-Lipid Panel (80061-LIPID)  Labs Reviewed: SGOT: 22 (03/29/2008)   SGPT: 17 (03/29/2008)   HDL:53.80 (03/29/2008), 53.7 (08/04/2007)  LDL:111 (03/29/2008), 120 (08/04/2007)  Chol:180 (03/29/2008), 187 (08/04/2007)  Trig:77.0 (03/29/2008), 67 (08/04/2007) to cont diet, Pt to continue diet efforts, good med tolerance; to check labs - goal LDL less than 70 given the carotid dz  Problem # 5:  HYPOTHYROIDISM (ICD-244.9)  Her updated medication list for this problem includes:    Levothroid 137 Mcg Tabs (Levothyroxine sodium) .Marland Kitchen... 1 by mouth once daily  Labs Reviewed: TSH: 2.11 (03/20/2007)    HgBA1c: 6.0 (03/29/2008) Chol: 180 (03/29/2008)   HDL: 53.80 (03/29/2008)   LDL: 111 (03/29/2008)   TG: 77.0 (03/29/2008) pt followed closely by endo for thyroid, asympt, Continue all previous medications as before this visit , declines TSH today  Problem # 6:  FATIGUE (ICD-780.79)  no OSA symtpoms, exam benign, to check labs below; follow with expectant management   Orders: TLB-BMP (Basic Metabolic Panel-BMET) (80048-METABOL) TLB-CBC Platelet - w/Differential (85025-CBCD) TLB-Hepatic/Liver Function Pnl (80076-HEPATIC) TLB-Sedimentation Rate (ESR) (85652-ESR) TLB-IBC Pnl (Iron/FE;Transferrin) (83550-IBC) TLB-B12 + Folate Pnl (82746_82607-B12/FOL) T-Vitamin D (25-Hydroxy) (09811-91478) TLB-Udip ONLY (81003-UDIP)  Complete Medication List: 1)  Levothroid 137 Mcg Tabs (Levothyroxine sodium) .Marland Kitchen.. 1 by mouth once daily 2)  Vitamin C   .... One by mouth daily 3)  Vitamin E  .... One by mouth daily 4)  Multivitamin  .... One by mouth daily 5)  Co Q 10  .... One by mouth daily 6)  Immune Vitamin  .... One by mouth daily 7)  Red Yeast Rice  .... Onepo daily 8)  Omega 3  .... One by mouth daily 9)  Glucosamine Chondrontin  .... One by mouth daily 10)  Green Tea Extract  .... One by mouth daily 11)  Potassium Chloride Crys Cr 20 Meq Cr-tabs (Potassium chloride crys cr) .... Take one tablet by mouth daily 12)  Triamterene-hctz 37.5-25 Mg Tabs (Triamterene-hctz) .... Take 1 by mouth once daily 13)  Hydroxyzine Hcl 25 Mg Tabs (Hydroxyzine hcl) .Marland Kitchen.. 1 -  2 by mouth q 6 hrs as needed 14)  Triamcinolone Acetonide 0.5 % Crea (Triamcinolone acetonide) .... Use asd two times a day as needed  Patient Instructions: 1)   Please take all new medications as prescribed 2)  Continue all previous medications as before this visit 3)  Please go to the Lab in the basement for your blood and/or urine tests today 4)  Please schedule a follow-up appointment in 1 year or sooner if needed Prescriptions: TRIAMCINOLONE ACETONIDE 0.5 % CREA (TRIAMCINOLONE ACETONIDE) use asd two times a day as needed  #1 x 0   Entered and Authorized by:   Corwin Levins MD   Signed by:   Corwin Levins MD on 06/13/2009   Method used:   Print then Give to Patient   RxID:   [id number] HYDROXYZINE HCL 25 MG TABS (HYDROXYZINE HCL) 1 - 2 by mouth q 6 hrs as needed  #40 x 2   Entered and Authorized by:   Corwin Levins MD   Signed by:   Corwin Levins MD on 06/13/2009   Method used:   Print then Give to Patient   RxID:   [id number] POTASSIUM CHLORIDE CRYS CR 20 MEQ CR-TABS (POTASSIUM CHLORIDE CRYS CR) Take one tablet by mouth daily  #30 x 11   Entered and Authorized by:   Corwin Levins MD   Signed by:   Corwin Levins MD on 06/13/2009   Method used:   Print then Give to Patient   RxID:   [id number] TRIAMTERENE-HCTZ 37.5-25 MG TABS (TRIAMTERENE-HCTZ) take 1 by  mouth once daily  #30 x 11   Entered and Authorized by:   Corwin Levins MD   Signed by:   Corwin Levins MD on 06/13/2009   Method used:   Print then Give to Patient   RxID:   [id number]

## 2010-02-10 NOTE — Progress Notes (Signed)
Summary: PT SWITCH FROM DR Jonny Ruiz TO DR LESCHBER--OK BOTH MD(S)  ---- Converted from flag ---- ---- 11/06/2009 8:28 AM, Corwin Levins MD wrote: ok with me  ---- 11/06/2009 7:47 AM, Ivar Bury wrote: She wants the appt w/Dr Felicity Coyer--  ---- 11/05/2009 6:49 PM, Corwin Levins MD wrote: with whom does she want the appt?  please clarify  ---- 11/05/2009 11:24 AM, Ivar Bury wrote: Dr Jonny Ruiz,  Your pt Rachael [name] request to switch to Dr Felicity Coyer.  She is requesting an appt because she is weak and have a terrible headache.  Thank you for your reply. ------------------------------

## 2010-02-10 NOTE — Miscellaneous (Signed)
Summary: Orders Update  Clinical Lists Changes  Orders: Added new Test order of Carotid Duplex (Carotid Duplex) - Signed 

## 2010-02-10 NOTE — Assessment & Plan Note (Signed)
Summary: rov   Visit Type:  Follow-up Primary Provider:  Corwin Levins MD  CC:  Carotid Stenosis.  History of Present Illness: The patient presents for followup of carotid stenosis. She's also had cardiovascular risk factors. Since I last saw her she's had bilateral hip surgeries. She has done well since having surgeries. She has been able to exercise more. She denies any chest pressure, neck or arm discomfort. She denies any palpitations, presyncope or syncope. She denies any PND or orthopnea. She had carotid Dopplers earlier this year which demonstrated stable less than 39% bilateral plaque. I reviewed lipids from June in which her LDL was 95.5 with an HDL of 58.9. Her triglycerides were elevated at that time.  Current Medications (verified): 1)  Levothroid 137 Mcg Tabs (Levothyroxine Sodium) .Marland Kitchen.. 1 By Mouth Once Daily 2)  Vitamin C .... One By Mouth Daily 3)  Vitamin E .... One By Mouth Daily 4)  Multivitamin .... One By Mouth Daily 5)  Co Q 10 .... One By Mouth Daily 6)  Immune Vitamin .... One By Mouth Daily 7)  Red Yeast Rice .... Onepo Daily 8)  Omega 3 .... One By Mouth Daily 9)  Glucosamine Chondrontin .... One By Mouth Daily 10)  Green Tea Extract .... One By Mouth Daily 11)  Potassium Chloride Crys Cr 20 Meq Cr-Tabs (Potassium Chloride Crys Cr) .... Take One Tablet By Mouth Daily 12)  Triamterene-Hctz 37.5-25 Mg Tabs (Triamterene-Hctz) .... Take 1 By Mouth Once Daily 13)  Vitamin D 1000 Unit Tabs (Cholecalciferol) .Marland Kitchen.. 1 By Mouth Daily  Allergies (verified): 1)  ! * Shellfish  Past History:  Past Medical History: Reviewed history from 06/13/2009 and no changes required. Hypothyroidism - dr Cherlyn Roberts in [place] endo Osteoarthritis - bilat hips, severe Obesity 2-D echo 11/29/2003 - normal LV size. Normal ejection fraction Chronic venous insufficiency Hyperlipidemia Glucose intolerance Normal coronary arteries per pt - 2003 Peripheral vascular disease - mild carotid  11/05 Anxiety  MD roster - ortho - allusio -  Past Surgical History: Reviewed history from 06/13/2009 and no changes required. Cholecystectomy Inguinal herniorrhaphy Tonsillectomy Back surgury - 1983 Right rotater cuff surgury both hip thr 05/2008 & 11/2008 - Dr Lequita Halt  Review of Systems       As stated in the HPI and negative for all other systems.   Vital Signs:  Patient profile:   77 year old female Height:      69 inches Weight:      260 pounds BMI:     38.53 Pulse rate:   63 / minute Resp:     16 per minute BP sitting:   146 / 77  (right arm)  Vitals Entered By: Marrion Coy, CNA (October 20, 2009 2:15 PM)  Physical Exam  General:  Well developed, well nourished, in no acute distress. Head:  normocephalic and atraumatic Eyes:  PERRLA/EOM intact; conjunctiva and lids normal. Neck:  Neck supple, no JVD. No masses, thyromegaly or abnormal cervical nodes. Chest Wall:  no deformities or breast masses noted Lungs:  Clear bilaterally to auscultation and percussion. Heart:  Non-displaced PMI, chest non-tender; regular rate and rhythm, S1, S2 without murmurs, rubs or gallops. Carotid upstroke normal, no bruit. Normal abdominal aortic size, no bruits. Femorals normal pulses, no bruits. Pedals normal pulses. No edema, no varicosities. Abdomen:  Bowel sounds positive; abdomen soft and non-tender without masses, organomegaly, or hernias noted. No hepatosplenomegaly. Msk:  Back normal, normal gait. Muscle strength and tone normal. Pulses:  pulses normal  in all 4 extremities Extremities:  No clubbing or cyanosis. Neurologic:  Alert and oriented x 3. Skin:  Intact without lesions or rashes. Cervical Nodes:  no significant adenopathy Axillary Nodes:  no significant adenopathy Psych:  Normal affect.   EKG  Procedure date:  10/20/2009  Findings:      sinus rhythm, rate 63, premature atrial contractions, axis within normal limits, intervals within normal limits, no acute  ST-T wave changes  Impression & Recommendations:  Problem # 1:  EDEMA (ICD-782.3) Her edema is much improved since she has been able to exercise. She will continue with conservative therapy. No other change in therapy or further evaluation is indicated.  Problem # 2:  CAROTID STENOSIS (ICD-433.10) We will follow carotid Dopplers again in 2013. She will continue with primary risk reduction.  Problem # 3:  ESSENTIAL HYPERTENSION, BENIGN (ICD-401.1) The patient's blood pressure was very slightly elevated today but typically well controlled. At this point she will begin to check and calm. I will not change her medical regimen. Hopefully weight loss will also contribute to a pressure control.  Other Orders: EKG w/ Interpretation (93000)  Patient Instructions: 1)  Your physician recommends that you schedule a follow-up appointment in: 18 months with Dr Antoine Poche 2)  Your physician recommends that you continue on your current medications as directed. Please refer to the Current Medication list given to you today.

## 2010-02-10 NOTE — Assessment & Plan Note (Signed)
Summary: FORMER DR Jonny Ruiz PT--TERRIBLE HEADACHES AND WEAK--OK SWITCH BOT...   Vital Signs:  Patient profile:   77 year old female Height:      69 inches (175.26 cm) Weight:      256.12 pounds (116.42 kg) O2 Sat:      93 % on Room air Temp:     99.0 degrees F (37.22 degrees C) oral Pulse rate:   73 / minute BP sitting:   118 / 58  (left arm) Cuff size:   large  Vitals Entered By: Orlan Leavens RMA (November 06, 2009 10:04 AM)  O2 Flow:  Room air CC: Transferring from Dr. Jonny Ruiz. C/o headaches and weakness, Headaches Is Patient Diabetic? No Pain Assessment Patient in pain? no        Primary Care Provider:  Corwin Levins MD  CC:  Transferring from Dr. Jonny Ruiz. C/o headaches and weakness and Headaches.  History of Present Illness:  Headaches      This is a 77 year old woman who presents with Headaches.  The symptoms began 5 days ago.  On a scale of 1 to 10, the intensity is described as a 6.  onset initially during aerobic exercise but lasted thru the night - improved after using ibuprofen; recurrence of HA symptoms yesterday at rest - pain all across head, not relieved with aleve. low grade fever today and feels achy "all over". currently almost no HA (<1/10).  The patient reports sweats, but denies nausea, vomiting, tearing of eyes, nasal congestion, sinus pain, sinus pressure, photophobia, and phonophobia.  The headache is described as intermittent, band-like, and pressure-like.  The location of the pain is bitemporal.  High-risk features (red flags) include new type of headache and age >50 years.  The patient denies the following high-risk features: neck pain/stiffness, vision loss or change, focal weakness, altered mental status, rash, trauma, immunosuppression, and anticoagulation use.  The headaches are precipitated by change in weather and strenous activity.  Prior treatment has included a NSAID.    also reviewed chronic med issues -  hypothyroid - follows with endo in [place] for  same - recent labs "ok" - no change skin bowels - reports compliance with ongoing medical treatment and no changes in medication dose or frequency. denies adverse side effects related to current therapy.   dyslipidemia - reports compliance with ongoing medical treatment and no changes in medication dose or frequency. denies adverse side effects related to current therapy.   chronic venous insuff - uses diuretics daily - no hx HTN - no londer on Kcl supplements per cards recs - ?if needs to resume  Preventive Screening-Counseling & Management  Alcohol-Tobacco     Smoking Status: never  Clinical Review Panels:  Prevention   Last Mammogram:  normal (03/11/2009)   Last Pap Smear:  normal (06/12/2007)   Last Colonoscopy:  abnormal (11/10/2007)  Immunizations   Last Tetanus Booster:  Tdap (11/14/2007)   Last Flu Vaccine:  declined (11/14/2007)   Last Pneumovax:  declined (12/26/2006)  Lipid Management   Cholesterol:  177 (06/13/2009)   LDL (bad choesterol):  111 (03/29/2008)   HDL (good cholesterol):  58.90 (06/13/2009)  CBC   WBC:  12.2 (06/13/2009)   RBC:  4.48 (06/13/2009)   Hgb:  13.4 (06/13/2009)   Hct:  39.0 (06/13/2009)   Platelets:  291.0 (06/13/2009)   MCV  87.1 (06/13/2009)   MCHC  34.4 (06/13/2009)   RDW  14.7 (06/13/2009)   PMN:  67.7 (06/13/2009)   Lymphs:  18.6 (06/13/2009)   Monos:  10.1 (06/13/2009)   Eosinophils:  3.3 (06/13/2009)   Basophil:  0.3 (06/13/2009)  Complete Metabolic Panel   Glucose:  112 (06/13/2009)   Sodium:  143 (06/13/2009)   Potassium:  3.9 (06/13/2009)   Chloride:  104 (06/13/2009)   CO2:  29 (06/13/2009)   BUN:  27 (06/13/2009)   Creatinine:  1.3 (06/13/2009)   Albumin:  4.3 (06/13/2009)   Total Protein:  6.6 (06/13/2009)   Calcium:  10.1 (06/13/2009)   Total Bili:  1.0 (06/13/2009)   Alk Phos:  74 (06/13/2009)   SGPT (ALT):  24 (06/13/2009)   SGOT (AST):  30 (06/13/2009)   Current Medications (verified): 1)  Levothroid 137  Mcg Tabs (Levothyroxine Sodium) .Marland Kitchen.. 1 By Mouth Once Daily 2)  Triamterene-Hctz 37.5-25 Mg Tabs (Triamterene-Hctz) .... Take 1 By Mouth Once Daily 3)  Vitamin D 1000 Unit Tabs (Cholecalciferol) .Marland Kitchen.. 1 By Mouth Daily 4)  Vitamin C 1000 Mg Tabs (Ascorbic Acid) .... Take 1 By Mouth Once Daily 5)  Vitamin E 1000 Unit Caps (Vitamin E) .... Take 1 By Mouth Once Daily 6)  Multivitamins  Tabs (Multiple Vitamin) .... Take 4 By Mouth Once Daily 7)  Co Q-10 150 Mg Caps (Coenzyme Q10) .... Take 1 By Mouth Once Daily 8)  Immune Professional St  Tabs (Multiple Vitamins-Minerals) .... Take 4 By Mouth Once Daily 9)  Omega-3 350 Mg Caps (Omega-3 Fatty Acids) .... Take 2 By Mouth Once Daily 10)  Green Tea 250 Mg Caps (Green Tea (Camillia Sinensis)) .... Take 1 By Mouth Once Daily  Allergies (verified): 1)  ! * Shellfish  Past History:  Past medical, surgical, family and social histories (including risk factors) reviewed, and no changes noted (except as noted below).  Past Medical History: Hypothyroidism - dr Cherlyn Roberts in [place] endo Osteoarthritis - bilat hips, severe Obesity 2-D echo 11/29/2003 - normal LV size. Normal ejection fraction Chronic venous insufficiency Hyperlipidemia Glucose intolerance Normal coronary arteries per pt - 2003 Peripheral vascular disease - mild carotid 11/05 Anxiety  MD roster: ortho - [facility] endo - rizvi ([place]) card - hochrein  Past Surgical History: Reviewed history from 06/13/2009 and no changes required. Cholecystectomy Inguinal herniorrhaphy Tonsillectomy Back surgury - 1983 Right rotater cuff surgury both hip thr 05/2008 & 11/2008 - Dr Lequita Halt  Family History: Reviewed history from 03/16/2008 and no changes required. Father with MI at 25 yo, brother with CABG age 47 Family History of CAD Female 1st degree relative <60 Aunt with hypothyroidism  Social History: Reviewed history from 06/13/2009 and no changes required. Divorced Never  Smoked Alcohol use-no Work - [employer] Lives with 69 yo mother Drug use-no enjoyes aerobics - 6x/wk - 3d water+ 3d cardio  Review of Systems  The patient denies anorexia, weight loss, vision loss, decreased hearing, hoarseness, chest pain, syncope, dyspnea on exertion, hemoptysis, abdominal pain, melena, severe indigestion/heartburn, muscle weakness, suspicious skin lesions, difficulty walking, and depression.    Physical Exam  General:  overweight-appearing.  alert, well-developed, well-nourished, and cooperative to examination.    Head:  Normocephalic and atraumatic without obvious abnormalities. No apparent alopecia or balding. Eyes:  vision grossly intact; pupils equal, round and reactive to light.  conjunctiva and lids normal.    Ears:  normal pinnae bilaterally, without erythema, swelling, or tenderness to palpation. TMs clear, without effusion, or cerumen impaction. Hearing grossly normal bilaterally  Mouth:  teeth and gums in good repair; mucous membranes moist, without lesions or ulcers. oropharynx clear without exudate,  no erythema.  Neck:  supple, full ROM, no masses, no thyromegaly; no thyroid nodules or tenderness. no JVD or carotid bruits.   Lungs:  normal respiratory effort, no intercostal retractions or use of accessory muscles; normal breath sounds bilaterally - no crackles and no wheezes.    Heart:  normal rate, regular rhythm, no murmur, and no rub. BLE without edema. Neurologic:  alert & oriented X3 and cranial nerves II-XII symetrically intact.  strength normal in all extremities, sensation intact to light touch, and gait normal. speech fluent without dysarthria or aphasia; follows commands with good comprehension.    Impression & Recommendations:  Problem # 1:  HEADACHE (ICD-784.0) neuro exam benign - suspect viral syndrome with myalgias - check labs r/o med abn or other infx -  tx OTC med tylenol/ibuprofen and hydration to call if symptoms unimproved, sooner if  worse Orders: TLB-BMP (Basic Metabolic Panel-BMET) (80048-METABOL) TLB-CBC Platelet - w/Differential (85025-CBCD) TLB-TSH (Thyroid Stimulating Hormone) (84443-TSH) TLB-Udip w/ Micro (81001-URINE)  Problem # 2:  MYALGIA (ICD-729.1) see above Orders: TLB-BMP (Basic Metabolic Panel-BMET) (80048-METABOL) TLB-CBC Platelet - w/Differential (85025-CBCD) TLB-Udip w/ Micro (81001-URINE)  Problem # 3:  HYPERLIPIDEMIA (ICD-272.4)  Pt to continue diet efforts, good med tolerance; to check labs - goal LDL less than 70 given the carotid dz  Labs Reviewed: SGOT: 30 (06/13/2009)   SGPT: 24 (06/13/2009)   HDL:58.90 (06/13/2009), 53.80 (03/29/2008)  LDL:111 (03/29/2008), 120 (08/04/2007)  Chol:177 (06/13/2009), 180 (03/29/2008)  Trig:233.0 (06/13/2009), 77.0 (03/29/2008)  Problem # 4:  HYPOTHYROIDISM (ICD-244.9)  Her updated medication list for this problem includes:    Levothroid 137 Mcg Tabs (Levothyroxine sodium) .Marland Kitchen... 1 by mouth once daily  Labs Reviewed: TSH: 2.11 (03/20/2007)    HgBA1c: 6.0 (03/29/2008) Chol: 177 (06/13/2009)   HDL: 58.90 (06/13/2009)   LDL: 111 (03/29/2008)   TG: 233.0 (06/13/2009)  Problem # 5:  FATIGUE (ICD-780.79)  Orders: TLB-BMP (Basic Metabolic Panel-BMET) (80048-METABOL) TLB-CBC Platelet - w/Differential (85025-CBCD) TLB-TSH (Thyroid Stimulating Hormone) (84443-TSH) TLB-Udip w/ Micro (81001-URINE)  Complete Medication List: 1)  Levothroid 137 Mcg Tabs (Levothyroxine sodium) .Marland Kitchen.. 1 by mouth once daily 2)  Triamterene-hctz 37.5-25 Mg Tabs (Triamterene-hctz) .... Take 1 by mouth once daily 3)  Vitamin D 1000 Unit Tabs (Cholecalciferol) .Marland Kitchen.. 1 by mouth daily 4)  Vitamin C 1000 Mg Tabs (Ascorbic acid) .... Take 1 by mouth once daily 5)  Vitamin E 1000 Unit Caps (Vitamin e) .... Take 1 by mouth once daily 6)  Multivitamins Tabs (Multiple vitamin) .... Take 4 by mouth once daily 7)  Co Q-10 150 Mg Caps (Coenzyme q10) .... Take 1 by mouth once daily 8)  Immune  Professional St Tabs (multiple Vitamins-minerals)  .... Take 4 by mouth once daily 9)  Omega-3 350 Mg Caps (Omega-3 fatty acids) .... Take 2 by mouth once daily 10)  Green Tea 250 Mg Caps (Green tea (camillia sinensis)) .... Take 1 by mouth once daily  Patient Instructions: 1)  it was good to see you today. 2)  test(s) ordered today - your results will be called to you after review in 24-48 hours from the time of test completion 3)  exam appears benign today - no evidence for stroke, suspect mild viral infection causing your headache and fatigue symptoms  4)  Get plenty of rest for next 48h, drink lots of clear liquids, and use Tylenol or Ibuprofen for fever and comfort. Return in 7-10 days if you're not better:sooner if you're feeling worse.   Orders Added: 1)  TLB-BMP (  Basic Metabolic Panel-BMET) [80048-METABOL] 2)  TLB-CBC Platelet - w/Differential [85025-CBCD] 3)  TLB-TSH (Thyroid Stimulating Hormone) [84443-TSH] 4)  TLB-Udip w/ Micro [81001-URINE] 5)  Est. Patient Level IV [41324]    Contraindications/Deferment of Procedures/Staging:    Test/Procedure: FLU VAX    Reason for deferment: patient declined

## 2010-02-10 NOTE — Miscellaneous (Signed)
Summary: Genevieve [facility]lander Medication Issuce Communication/Order  Genevieve [facility]lander Medication Issuce Communication/Order   Imported By: Roderic Ovens 01/22/2009 13:09:59  _____________________________________________________________________  External Attachment:    Type:   Image     Comment:   External Document

## 2010-02-10 NOTE — Progress Notes (Signed)
Summary: CALLING ABOUT SCHEDULING A ECHO ? carotid  Phone Note Call from Patient Call back at Home Phone [phone] Call back at [phone]   Caller: Patient Summary of Call: PT CALLING ABOUT SCHEDULING A ECHO Initial call taken by: Judie Grieve,  April 02, 2009 10:28 AM  Follow-up for Phone Call        Will ask Dr Antoine Poche if he wants to see the pt 1st and if this should be a carotid doppler.  Pam Fleming-Hayes,RN  Additional Follow-up for Phone Call Additional follow up Details #1::        Overdue for carotid doppler.  No echo indicated that I can see.  This is mentioned in Jennings' note but not in mine. Additional Follow-up by: Rollene Rotunda, MD, [facility],  April 02, 2009 4:49 PM    Additional Follow-up for Phone Call Additional follow up Details #2::    Pt is scheduled for carotid doppler April 5,2011 Follow-up by: Charolotte Capuchin, RN,  April 03, 2009 9:46 AM

## 2010-02-10 NOTE — Miscellaneous (Signed)
Summary: BONE DENSITY  Clinical Lists Changes  Orders: Added new Test order of T-Bone Densitometry (77080) - Signed Added new Test order of T-Lumbar Vertebral Assessment (77082) - Signed 

## 2010-04-15 LAB — BASIC METABOLIC PANEL
CO2: 29 mEq/L (ref 19–32)
Chloride: 98 mEq/L (ref 96–112)
GFR calc Af Amer: 59 mL/min — ABNORMAL LOW (ref >60)
GFR calc non Af Amer: 45 mL/min — ABNORMAL LOW (ref >60)
Glucose, Bld: 134 mg/dL — ABNORMAL HIGH (ref 70–99)
Glucose, Bld: 135 mg/dL — ABNORMAL HIGH (ref 70–99)
Potassium: 3.7 mEq/L (ref 3.5–5.1)
Potassium: 3.8 mEq/L (ref 3.5–5.1)
Sodium: 135 mEq/L (ref 135–145)
Sodium: 137 mEq/L (ref 135–145)

## 2010-04-15 LAB — CBC
HCT: 29 % — ABNORMAL LOW (ref 36.0–46.0)
HCT: 30.4 % — ABNORMAL LOW (ref 36.0–46.0)
HCT: 30.7 % — ABNORMAL LOW (ref 36.0–46.0)
Hemoglobin: 10.2 g/dL — ABNORMAL LOW (ref 12.0–15.0)
Hemoglobin: 10.4 g/dL — ABNORMAL LOW (ref 12.0–15.0)
Hemoglobin: 9.9 g/dL — ABNORMAL LOW (ref 12.0–15.0)
MCHC: 33.7 g/dL (ref 30.0–36.0)
MCHC: 34 g/dL (ref 30.0–36.0)
MCV: 85.4 fL (ref 78.0–100.0)
MCV: 86.3 fL (ref 78.0–100.0)
MCV: 86.3 fL (ref 78.0–100.0)
RBC: 3.56 MIL/uL — ABNORMAL LOW (ref 3.87–5.11)
RDW: 13.6 % (ref 11.5–15.5)
WBC: 6.9 10*3/uL (ref 4.0–10.5)

## 2010-04-15 LAB — TYPE AND SCREEN: Antibody Screen: NEGATIVE

## 2010-04-15 LAB — PROTIME-INR: INR: 1.24 (ref 0.00–1.49)

## 2010-04-16 LAB — CBC
HCT: 39.3 % (ref 36.0–46.0)
MCHC: 32.9 g/dL (ref 30.0–36.0)
MCV: 86.3 fL (ref 78.0–100.0)
Platelets: 278 10*3/uL (ref 150–400)
WBC: 8.6 10*3/uL (ref 4.0–10.5)

## 2010-04-16 LAB — COMPREHENSIVE METABOLIC PANEL
Albumin: 3.8 g/dL (ref 3.5–5.2)
BUN: 23 mg/dL (ref 6–23)
Chloride: 106 mEq/L (ref 96–112)
Creatinine, Ser: 0.98 mg/dL (ref 0.4–1.2)
GFR calc non Af Amer: 56 mL/min — ABNORMAL LOW (ref >60)
Total Bilirubin: 1.1 mg/dL (ref 0.3–1.2)

## 2010-04-16 LAB — APTT: aPTT: 26 seconds (ref 24–37)

## 2010-04-16 LAB — URINALYSIS, ROUTINE W REFLEX MICROSCOPIC
Bilirubin Urine: NEGATIVE
Ketones, ur: NEGATIVE mg/dL
Nitrite: NEGATIVE
Specific Gravity, Urine: 1.018 (ref 1.005–1.030)
Urobilinogen, UA: 0.2 mg/dL (ref 0.0–1.0)

## 2010-04-16 LAB — PROTIME-INR: INR: 0.93 (ref 0.00–1.49)

## 2010-04-21 LAB — CBC
HCT: 27.2 % — ABNORMAL LOW (ref 36.0–46.0)
HCT: 38 % (ref 36.0–46.0)
Hemoglobin: 9.3 g/dL — ABNORMAL LOW (ref 12.0–15.0)
Hemoglobin: 9.9 g/dL — ABNORMAL LOW (ref 12.0–15.0)
MCHC: 34.1 g/dL (ref 30.0–36.0)
MCHC: 34.4 g/dL (ref 30.0–36.0)
MCHC: 34.4 g/dL (ref 30.0–36.0)
MCV: 88.4 fL (ref 78.0–100.0)
Platelets: 191 K/uL (ref 150–400)
Platelets: 209 10*3/uL (ref 150–400)
Platelets: 286 10*3/uL (ref 150–400)
RBC: 3.07 MIL/uL — ABNORMAL LOW (ref 3.87–5.11)
RDW: 13 % (ref 11.5–15.5)
RDW: 13.1 % (ref 11.5–15.5)
RDW: 13.4 % (ref 11.5–15.5)
RDW: 13.6 % (ref 11.5–15.5)
WBC: 11.5 K/uL — ABNORMAL HIGH (ref 4.0–10.5)

## 2010-04-21 LAB — BASIC METABOLIC PANEL WITH GFR
BUN: 12 mg/dL (ref 6–23)
BUN: 17 mg/dL (ref 6–23)
CO2: 30 meq/L (ref 19–32)
CO2: 29 meq/L (ref 19–32)
Calcium: 8.7 mg/dL (ref 8.4–10.5)
Calcium: 8.2 mg/dL — ABNORMAL LOW (ref 8.4–10.5)
Chloride: 101 meq/L (ref 96–112)
Chloride: 99 meq/L (ref 96–112)
Creatinine, Ser: 1.19 mg/dL (ref 0.4–1.2)
Creatinine, Ser: 1.17 mg/dL (ref 0.4–1.2)
GFR calc Af Amer: 54 mL/min — ABNORMAL LOW (ref >60)
GFR calc non Af Amer: 45 mL/min — ABNORMAL LOW (ref >60)
GFR calc non Af Amer: 45 mL/min — ABNORMAL LOW
Glucose, Bld: 119 mg/dL — ABNORMAL HIGH (ref 70–99)
Glucose, Bld: 154 mg/dL — ABNORMAL HIGH (ref 70–99)
Potassium: 3.7 meq/L (ref 3.5–5.1)
Potassium: 3.6 meq/L (ref 3.5–5.1)
Sodium: 137 meq/L (ref 135–145)
Sodium: 133 meq/L — ABNORMAL LOW (ref 135–145)

## 2010-04-21 LAB — URINALYSIS, ROUTINE W REFLEX MICROSCOPIC
Bilirubin Urine: NEGATIVE
Glucose, UA: NEGATIVE mg/dL
Hgb urine dipstick: NEGATIVE
Ketones, ur: NEGATIVE mg/dL
Nitrite: NEGATIVE
Protein, ur: NEGATIVE mg/dL
Specific Gravity, Urine: 1.016 (ref 1.005–1.030)
Urobilinogen, UA: 0.2 mg/dL (ref 0.0–1.0)
pH: 7 (ref 5.0–8.0)

## 2010-04-21 LAB — URINE MICROSCOPIC-ADD ON

## 2010-04-21 LAB — COMPREHENSIVE METABOLIC PANEL WITH GFR
ALT: 18 U/L (ref 0–35)
AST: 22 U/L (ref 0–37)
Albumin: 3.9 g/dL (ref 3.5–5.2)
Alkaline Phosphatase: 72 U/L (ref 39–117)
BUN: 33 mg/dL — ABNORMAL HIGH (ref 6–23)
CO2: 30 meq/L (ref 19–32)
Calcium: 9.6 mg/dL (ref 8.4–10.5)
Chloride: 104 meq/L (ref 96–112)
Creatinine, Ser: 1.27 mg/dL — ABNORMAL HIGH (ref 0.4–1.2)
GFR calc Af Amer: 50 mL/min — ABNORMAL LOW (ref >60)
GFR calc non Af Amer: 41 mL/min — ABNORMAL LOW (ref >60)
Glucose, Bld: 124 mg/dL — ABNORMAL HIGH (ref 70–99)
Potassium: 4.2 meq/L (ref 3.5–5.1)
Sodium: 142 meq/L (ref 135–145)
Total Bilirubin: 1.3 mg/dL — ABNORMAL HIGH (ref 0.3–1.2)
Total Protein: 6.6 g/dL (ref 6.0–8.3)

## 2010-04-21 LAB — PROTIME-INR
INR: 1.6 — ABNORMAL HIGH (ref 0.00–1.49)
INR: 2.2 — ABNORMAL HIGH (ref 0.00–1.49)
INR: 1 (ref 0.00–1.49)
INR: 1.2 (ref 0.00–1.49)
Prothrombin Time: 24.5 seconds — ABNORMAL HIGH (ref 11.6–15.2)
Prothrombin Time: 25.5 s — ABNORMAL HIGH (ref 11.6–15.2)
Prothrombin Time: 15.1 s (ref 11.6–15.2)

## 2010-04-21 LAB — TYPE AND SCREEN
ABO/RH(D): O POS
Antibody Screen: NEGATIVE

## 2010-04-21 LAB — ABO/RH: ABO/RH(D): O POS

## 2010-04-21 LAB — APTT: aPTT: 28 s (ref 24–37)

## 2010-05-26 NOTE — Discharge Summary (Signed)
NAME:  Rachael [name], Rachael [name]                 ACCOUNT NO.:  [id number]   MEDICAL RECORD NO.:  [id number]          PATIENT TYPE:  INP   LOCATION:  1617                         FACILITY:  [facility]   PHYSICIAN:  Ollen Gross, M.D.    DATE OF BIRTH:  08-22-1933   DATE OF ADMISSION:  05/24/2008  DATE OF DISCHARGE:  05/28/2008                               DISCHARGE SUMMARY   ADMITTING DIAGNOSES:  1. Osteoarthritis right hip greater than left.  2. Bilateral peripheral edema.  3. Hypothyroidism.   DISCHARGE DIAGNOSES:  1. Osteoarthritis right hip, status post right total hip replacement      arthroplasty.  2. Postoperative acute blood loss anemia, did not require transfusion.  3. Postoperative hyponatremia, improved.  4. Osteoarthritis left hip.  5. Bilateral peripheral edema.  6. Hypothyroidism.   PROCEDURE:  On May 24, 2008, right total hip.  Surgeon, Dr. Lequita Halt.  Assistant, Avel Peace, PA-C.  The surgery was done under spinal  anesthesia.   CONSULTS:  None.   BRIEF HISTORY:  Rachael [name] is a 77 year old female with end-stage  arthritis of both hips, right more symptomatic than left.  She has had  progressive worsening pain and dysfunction, failed non-operative  management and now presents for total hip arthroplasty.   LABORATORY DATA:  Preop CBC showed a hemoglobin of 13.0, hematocrit of  38.0, white cell count 7.2, platelets 286.  PT/INR 12.9 and 1.0 with a  PTT of 28.  Chem panel on admission, slightly elevated total bili of  1.3, BUN and creatinine elevated at 33 and 1.2 on exam.  Remaining Chem  panel within normal limits.  Preop UA did show small leukocytes, rare  squamous and 36 white cells.  Serial CBCs were followed.  Hemoglobin  dropped down to 9.9, then back up to 10.4.  Last hemoglobin and  hematocrit 9.3 and 27.2.  Serial protimes followed per Coumadin  protocol.  Last PT/INR 25.5 and 2.2.  Serial BMETs were followed.  Sodium did drop from 142-133 back up to 137.  Remaining  electrolytes  remained within normal limits.   EKG on March 18, 2008, sinus bradycardia, otherwise normal.  Two-view  chest on August 06, 2007, no acute cardiopulmonary process.  Right hip  film on May 16, 2008, advanced bilateral hip osteoarthritis, no acute  findings.   HOSPITAL COURSE:  The patient admitted to [facility], taken to  the OR and underwent the above-stated procedure without complication.  The patient tolerated the procedure well and later transferred to the  recovery room and then orthopedic floor.  Started on PCA and p.o.  analgesics.  Given 24 hours postop IV antibiotics.  Hemovac drain placed  during surgery was pulled without difficulty.  Discontinued the PCA on  postop day #1 and encouraged p.o. meds.  Weaned over to p.o. meds and  fluids.  Once she was tolerating fluids well, we discontinued DC the IV  fluids, started getting up out of bed on day #1.  By day #2, she was  walking short distances 3 feet in the room.  She did want to look  into a  skilled facility over at [facility] in [place].  We got social work  involved.  She did a little bit better by day #2.  Main complaints other  than just some hip soreness with shoulder pain.  She states she had  bilateral shoulder issues and was slow to get up with a walker due to  her shoulder.  By day #3, the shoulders were a little more sore after  getting up, felt she needed one more day for mobility training.  Hemoglobin was down to 9.3.  She was asymptomatic with this.  We did  place her on some iron supplementation.  Prior to discharge, we reduced  the pain meds from Percocet over to Vicodin because of a little better  of rash also hypoallergenic sheets.  By the following day on May 28, 2008, on postop day #4, she was doing much better, overall feeling  better, moving around a little bit better with therapy and we decided to  be transferred over at that time.   DISCHARGE PLAN:  1. The patient  transferred to [facility] [place]  2. Discharge diagnoses, please see above.   DISCHARGE MEDICATIONS:  1. Current medications at the time of transfer include Coumadin      protocol.  Please titrate the Coumadin level for a target INR      between 2.0-3.0.  She has been on Coumadin for 3 weeks from the      date of surgery, May 24, 2008.  2. Colace 100 mg p.o. b.i.d., hold if diarrhea.  3. Cytomel 5 mcg p.o. b.i.d.  4. Synthroid 137 mcg p.o. q.a.m.  5. Triamterine/hydrochlorothiazide 75/50 p.o. q.a.m.  6. Potassium 20 mEq 1 p.o. q.a.m.  7. Robaxin 500 mg p.o. q.6-8 h., p.r.n. spasm.  8. Tylenol 325 one or two every 4-6 hours as need for mild pain,      temperature or headache.  9. Laxative of choice.  10.Enema of choice.  11.Vicodin 5 mg one or two every 4 hours as needed for pain.   DIET:  As tolerated.   ACTIVITY:  We are increasing her to full weightbearing to the right  lower extremity gait training, ambulation, ADLs, hip precautions, total  hip protocol.  She may leave the dressing off the incision.  The tape  was starting to irritate the skin a little bit, but the incision was  healing well.  Please re-cover with dressing if she experiences any  drainage.  Otherwise, she may leave the dressing off if no drainage.  She may start showering, however, do not submerge the incision under  water.  Ambulate with walker, full weightbearing, hip protocol.   FOLLOWUP:  She is follow up with Dr. Lequita Halt approximately 2 weeks from  date of surgery.  Please contact the office at [phone] to help arrange  appointment follow-up for this patient at the [place] office of  [facility].   DISPOSITION:  [facility] at [place].   CONDITION ON DISCHARGE:  Improved.      Alexzandrew L. Perkins, P.A.C.      Ollen Gross, M.D.  Electronically Signed    ALP/MEDQ  D:  05/28/2008  T:  05/28/2008  Job:  [id number]   cc:   Ollen Gross, M.D.  Fax:  [phone]   Rollene Rotunda, MD, [facility]  [address]  [address]  [place]  [place] [address]   Barry Dienes. Eloise Harman, M.D.  Fax: [phone]   [facility]  [place]

## 2010-05-26 NOTE — H&P (Signed)
NAME:  RAMATOULAYE, PACK NO.:  [id number]   MEDICAL RECORD NO.:  [id number]          PATIENT TYPE:  INP   LOCATION:  1617                         FACILITY:  [facility]   PHYSICIAN:  Ollen Gross, M.D.    DATE OF BIRTH:  11/19/1933   DATE OF ADMISSION:  05/24/2008  DATE OF DISCHARGE:                              HISTORY & PHYSICAL   CHIEF COMPLAINT:  Right hip pain.   HISTORY OF PRESENT ILLNESS:  The patient is a 77 year old female who was  seen as second opinion last year and has been followed by Dr. Lequita Halt  for some time for bilateral hip pain.  The right is more symptomatic  than the left.  The left was problematic for quite some time, but the  right has been more problematic recently. She has known end-stage  arthritis. On radiograph the left appears to be worse than the right,  but at this time the right is more problematic. It is felt she would  benefit from undergoing surgical intervention.  Risks and benefits have  been discussed.  She elects to proceed with surgery.   ALLERGIES:  SHELLFISH and IODINE.  No known pharmaceutical drug  allergies.   CURRENT MEDICATIONS:  Triamterene/HCTZ. Levothyroxine. Potassium and  Lasix.   PAST MEDICAL HISTORY:  Bilateral peripheral edema. Hypothyroidism.  Arthritis.   PAST SURGICAL HISTORY:  Back surgery 1983, gallbladder surgery 2005.  Abdominal ventral hernia repair secondary to having a gallbladder  surgery in 2005. Rotator cuff repair in 2006. Finger surgery 2006.   FAMILY HISTORY:  Father deceased at age 41 with heart attack.  Mother  living age 62, hypertension.  Brother age 60 bypass.   SOCIAL HISTORY:  Divorced.  He works in Airline pilot.  Nonsmoker.  No alcohol.  Lives alone.  She does want to look into  [facility]  re[facility] center.   REVIEW OF SYSTEMS:  GENERAL:  No fevers, chills, night sweats.  NEUROLOGICAL:  No  seizures, syncope, or paralysis.  RESPIRATORY:  No  shortness breath, productive cough,  or hemoptysis.  CARDIOVASCULAR:  No  chest pain or orthopnea.  GI: No nausea, vomiting, diarrhea, or  constipation.  GU: No dysuria or hematuria, discharge.  MUSCULOSKELETAL:  Hip pain.   PHYSICAL EXAMINATION:  VITAL SIGNS:  Pulse 68, respirations 14, blood  pressure 136/58.  GENERAL: A 77 year old white female well-nourished, well-developed, no  acute distress.  She is alert and oriented, cooperative, overweight.  HEENT: Normocephalic, atraumatic.  Pupils are round and reactive.  Oropharynx is clear.  EOMs intact.  NECK:  Supple.  CHEST: Clear anterior posterior chest wall.  No rhonchi, rales or  wheezing.  HEART: Regular rate and rhythm.  No murmur, S1-S2 noted.  ABDOMEN: Soft, nontender.  Bowel sounds present, protuberant abdomen.  RECTAL, BREASTS, GENITALIA:  Not done, not pertinent to the present  illness.  EXTREMITIES:  Right hip flexion 90, internal rotation 10, external  rotation 20, abduction 30. Left hip flexion 90, 0 internal rotation 10  degrees external rotation 20 degrees abduction.   IMPRESSION:  Osteoarthritis right hip, greater than  left hip.   PLAN:  The patient was admitted to [facility] to undergo a  right total replacement arthroplasty.  Surgery will be performed by Dr.  Ollen Gross.      Alexzandrew L. Perkins, P.A.C.      Ollen Gross, M.D.  Electronically Signed    ALP/MEDQ  D:  05/26/2008  T:  05/26/2008  Job:  [id number]   cc:   Rollene Rotunda, MD, [facility]  [address]  [address]  [place]  [place] [address]   Barry Dienes. Eloise Harman, M.D.  Fax: [phone]   Ollen Gross, M.D.  Fax: [phone]

## 2010-05-26 NOTE — Assessment & Plan Note (Signed)
[facility]                            CARDIOLOGY OFFICE NOTE   ROZELL, KETTLEWELL                        MRN:          [id number]  DATE:08/07/2007                            DOB:          [birth year]    PRIMARY CARE PHYSICIAN:  Marcelino Duster L. Grewal, MD   REASON FOR CONSULTATION:  Evaluate the patient with lower extremity  swelling and cardiovascular risk factors.   HISTORY OF PRESENT ILLNESS:  The patient is a pleasant 77 year old who  was seen in our office in 2005.  At that time, she did have carotid  Dopplers demonstrating some mild bilateral carotid plaque.  She had a  Holter monitor demonstrating occasional PVCs and PACs.  She does not  remember of this evaluation.  Of note, the patient does report chest  pain in an abnormal stress test in the past with a cardiac  catheterization this decade.  However, I cannot find documentation of  this.  She subsequently found to have cholecystitis as the cause of her  chest pain.  She had this removed.   She has been treated for swelling.  This happened on and off over the  past few years.  However, in May, she took a flight to [place] and  when she came back she had leg swelling that would not resolve.  This  was slowly progressive.  Recently, she was started on triamterene and  hydrochlorothiazide and said she has had significant improvement of her  edema.  She has had no further evaluation of this.  She has not had any  PND or orthopnea.  She somewhat limited in her exercise tolerance by hip  pain, but she is able to lying down without any shortness of breath.  She does not get any chest pressure, neck, or arm discomfort.  She has  no palpitations.   Of note, the patient was recently seen in the emergency room for a  syncopal episode that happened when she was in her shower.  This was  last week.  She did have an evaluation included a negative D-dimer and a  head CT that was negative.  Labs were  unremarkable.  I suspect a  diagnosis of vasovagal syncope.  She has had no further episodes.   PAST MEDICAL HISTORY:  1. Hypothyroidism.  2. Basal cell carcinoma.   PAST SURGICAL HISTORY:  1. Tonsillectomy.  2. Back surgery.  3. Cholecystectomy.  4. Hernia repair.  5. Rotator cuff surgery.   ALLERGIES:  Intolerance to SHELLFISH.   MEDICATIONS:  1. Levothyroxine 112 mcg daily.  2. Triamterene and hydrochlorothiazide 75/50.  3. Cytomel.   SOCIAL HISTORY:  The patient is in sales.  She is divorced.  She has 5  children.  Her father died at 53 of myocardial infarction.  She had a  brother with bypass at age 24.   REVIEW OF SYSTEMS:  As stated in the HPI and positive for recent  nosebleed, occasional constipation, joint pains, and leg cramping.  Negative for all other system.   PHYSICAL EXAMINATION:  GENERAL:  The patient is in no  acute distress.  VITAL SIGNS:  Blood pressure 149/77, heart rate 68 and regular, weight  238 pounds, and body mass index is 36.  HEENT:  Eyelids are unremarkable.  Pupils are equal, round, and reactive  to light.  Fundi not visualized.  Oral mucosa unremarkable.  NECK:  No jugular venous distension at 45 degrees, carotid upstroke  brisk and symmetrical.  No bruits.  No thyromegaly.  LYMPHATICS:  No  cervical, axillary, or inguinal lymphadenopathy.  LUNGS:  Clear to auscultation bilaterally.  BACK:  No costovertebral angle tenderness.  CHEST:  Unremarkable.  HEART:  PMI not displaced or sustained.  S1 and S2 within normal limits.  No S3.  No S4.  No clicks.  No rubs.  No murmurs.  ABDOMEN:  Obese, positive bowel sounds, normal in frequency and pitch.  No bruits.  No rebound.  No guarding.  No midline pulsatile mass.  No  hepatosplenomegaly.  SKIN:  No rashes.  No nodules.  EXTREMITIES:  2+ pulses throughout, mild bilateral lower extremity  edema, chronic venous stasis changes, and superficial varicose veins.  NEURO:  Oriented to person, place, and  time.  Cranial nerves II through  XII grossly intact.  Motor grossly intact throughout.   ASSESSMENT AND PLAN:  1. Lower extremity edema.  I doubt a cardiac etiology, though I could      not absolutely exclude this or pulmonary hypertension.  Therefore,      she will get an echocardiogram.  We will also get lower extremity      venous Dopplers to look for venous incompetence with a low      possibility of deep venous thrombosis.  Provided, there is no      cardiac etiology.  We have discussed extensively conservative      management of lower extremity edema.  I have given a her      prescription for knee-high stockings as she has a hard time putting      on or keeping on thigh high once.  2. Carotid stenosis.  She has some mild carotid stenosis.  This was a      few years ago and she needs a repeat Doppler in all ranges.  3. Cardiovascular risk factors.  The patient has significant      cardiovascular risk factors with a strong family history, evidence      of mild peripheral vascular disease.  I will bring her back for an      exercise treadmill test.  This will allow me to rule out coronary      artery disease for which she has a somewhat low-to-moderate pretest      probability.  This will allow me to give a      prescription for exercise and risk stratify.  4. Followup would be at the time of her treadmill and after the other      studies have been done.     Rollene Rotunda, MD, [facility]  Electronically Signed    JH/MedQ  DD: 08/07/2007  DT: 08/08/2007  Job #: [id number]   cc:   Marcelino Duster L. Vincente Poli, M.D.

## 2010-05-26 NOTE — Procedures (Signed)
[facility]                              EXERCISE TREADMILL   Rachael [name], Rachael [name]                        MRN:          [id number]  DATE:08/29/2007                            DOB:          1933/01/21    PROCEDURE:  Exercise treadmill test.   INDICATION:  Evaluate the patient with dyspnea and multiple  cardiovascular risk factors.   PROCEDURE NOTE:  The patient exercised using standard Bruce protocol.  She exercised for 6 minutes, which completed stage II.  The test was  terminated because she achieved the target heart rate because of  dyspnea.  Her peak heart rate was 141, which was 94% of predicted.  Her  METs were 7.0.  Peak blood pressure was 194/54 and was achieved early.  There were no arrhythmias.  There were no ischemic ST-T wave changes.  She did not have a normal heart rate recovery.   CONCLUSION:  Negative adequate exercise treadmill test.  However, the  patient had a poor exercise tolerance.  She did not have evidence for  high-grade obstructive disease, but could not be considered to be in the  lowest risk category for future cardiovascular events.   Of note, the patient did have a run of supraventricular tachycardia in  recovery.  This appeared to be slightly irregular and may have been  fibrillation, but it was only a few beats.  It could have been  paroxysmal atrial tachycardia.  Based on the above and an echocardiogram  that she had done, I see no clear cardiovascular disease.  She does have  some mildly elevated peak pulmonary pressures.  She has normal left  ventricular function.  She has lower extremity edema, but venous  Dopplers did not demonstrate any venous incompetence or clots.  She is  wearing compression stockings and using a diuretic.  She needs weight  loss.  She can exercise based on suggestions given to her during this  test.  She needs aggressive primary risk factor modification.   I am going to see her back in  about 6 months to see how she is doing  with all of these issues.     Rollene Rotunda, MD, [facility]  Electronically Signed    JH/MedQ  DD: 08/29/2007  DT: 08/30/2007  Job #: [id number]   cc:   Marcelino Duster L. Vincente Poli, M.D.

## 2010-05-26 NOTE — Op Note (Signed)
NAME:  [name], [name] NO.:  [id number]   MEDICAL RECORD NO.:  [id number]          PATIENT TYPE:  INP   LOCATION:  0003                         FACILITY:  [facility]   PHYSICIAN:  Ollen Gross, M.D.    DATE OF BIRTH:  01/22/1933   DATE OF PROCEDURE:  05/24/2008  DATE OF DISCHARGE:                               OPERATIVE REPORT   PREOPERATIVE DIAGNOSES:  Osteoarthritis, right hip.   POSTOPERATIVE DIAGNOSES:  Osteoarthritis, right hip.   PROCEDURE:  Right total hip arthroplasty.   SURGEON:  Ollen Gross, M.D.   ASSISTANT:  Avel Peace PA-C   ANESTHESIA:  Spinal.   ESTIMATED BLOOD LOSS:  550.   DRAINS:  Hemovac times one.   COMPLICATIONS:  None.   CONDITION:  Stable to recovery room.   CLINICAL NOTE:  [name] is a 77 year old female with end-stage  arthritis of both hips, right more symptomatic than the left.  She has  had progressively worsening pain and dysfunction.  She has failed  nonoperative management.  She presents now for right total hip  arthroplasty.   PROCEDURE IN DETAIL:  After successful administration of spinal  anesthetic, the patient was placed in left lateral decubitus position  with the right side up and held with the hip positioner.  The right  lower extremity was isolated from her perineum with plastic drapes and  prepped and draped in the usual sterile fashion.   A short posterolateral incision is made with a 10-blade through  subcutaneous tissue to the level of the fascia lata, which is incised in  line with the skin incision.  Sciatic nerve was palpated and protected  and the short external rotators isolated off the femur.  Capsulotomy is  performed and capsule retracted.  The hip is then dislocated.  Center of  femoral head is marked and trial prosthesis placed such that the center  of the trial head corresponds to the center of her native femoral head.  Osteotomy line is marked on the femoral neck and osteotomy made with an  oscillating saw.   The femur is then prepared with the canal finder and irrigation.  Axial  reaming is performed to 13.5 mm, proximal reaming with an 23F and the  sleeve machined to a small.  23F small trial sleeve is then placed.   The femur is retracted anteriorly and the acetabular retractors placed.  Acetabular reaming begins at 45 mm, coursing increments of 2-53 mm and  then a 54-mm Pinnacle acetabular shell is placed in anatomic position  with excellent purchase with no need for any provisional screw fixation.  The apex hole eliminator is placed and then the permanent 36 mm neutral  Ultimate liner is placed for metal-on-metal hip replacement.  We then  placed a trial 18 x 13 stem with 36 + 8 neck, matching native  anteversion.  With 36.0 head it is reduced too easily and did not have  enough offset, so I went to a 36 +12 neck, which had more appropriate  soft-tissue tension.  With a +0 head she was a little short on  that  size.  I went to +3, which was more appropriate.  She had fantastic  stability with full extension, flexion, rotation, 70 degrees flexion, 40  degrees adduction, 90 degrees internal rotation, and 90 degrees of  flexion and 70 degrees of internal rotation.  By placing the right leg  on top of the left, leg lengths were equal.   Hip was then dislocated and all trials were removed.  The permanent 49F  small sleeve is placed into the femur with the permanent 18 x 13 stem  and 36 +12 neck, matching native anteversion.  A 36 +3 head is placed  and hip is reduced with the same stability parameters.  Wound was  copiously irrigated with saline solution and short rotators and capsule  reattached to the femur through drill holes.  Fascia lata was closed  over a Hemovac drain with interrupted #1 Vicryl, subcu closed #1 and 2-0  Vicryl and subcuticular running 4-0 Monocryl.  Drain was hooked to  suction.  Incision cleaned and dried and Steri-Strips and a bulky  sterile  dressing applied.  She was placed into a knee immobilizer,  awakened, and transported to recovery in stable condition.      Ollen Gross, M.D.  Electronically Signed     FA/MEDQ  D:  05/24/2008  T:  05/24/2008  Job:  [id number]

## 2010-05-29 NOTE — Op Note (Signed)
[place]  Patient:    Rachael [name], Rachael [name] Visit Number: [id number] MRN: [id number]          Service Type: MED Location: [phone] Attending Physician:  Brandy Hale Dictated by:   Velora Heckler, M.D. Proc. Date: 03/03/01 Admit Date:  03/02/2001                             Operative Report  PREOPERATIVE DIAGNOSIS:  Acute cholecystitis, cholelithiasis.  POSTOPERATIVE DIAGNOSIS:  Acute cholecystitis, cholelithiasis.  PROCEDURE:  Laparoscopic cholecystectomy with intraoperative cholangiography.  SURGEON:  Velora Heckler, M.D.  ASSISTANT:  Gita Kudo, M.D.  ANESTHESIA:  General.  ESTIMATED BLOOD LOSS:  Minimal.  PREPARATION:  Betadine.  COMPLICATIONS:  None.  INDICATION:  The patient is a 77 year old white female admitted from the emergency department of [facility] by [facility]. Derrell Lolling, M.D., on March 02, 2001.  The patient presented with epigastric abdominal pain, nausea, and vomiting of one days duration.  She was noted to have an elevated white blood cell count of 12,000.  The patient was started on intravenous antibiotics.  She was prepared and now brought to the operating room for cholecystectomy.  DESCRIPTION OF PROCEDURE:  The procedure was done in OR #17 at the [facility].  The patient was brought to the operating room, placed in a supine position on the operating room table.  Following administration of general anesthesia, the patient was prepped and draped in the usual strict aseptic fashion.  After ascertaining that an adequate level of anesthesia had been obtained, an infraumbilical incision is made in the midline with a #15 blade.  Dissection is carried down through the subcutaneous tissues.  Fascia is incised in the midline.  The peritoneal cavity is entered cautiously.  An 0 Vicryl pursestring suture is placed in the fascia.  A Hasson cannula is introduced under direct  vision and the abdomen insufflated with carbon dioxide.  Laparoscope is introduced under direct vision, and the abdomen is explored.  There is an acute distended, edematous gallbladder in the right upper quadrant.  It is quite distended.  Operative ports are placed along the right costal margin in the midline, midclavicular line, and anterior axillary line.  An aspirating trocar is introduced and the gallbladder is aspirated.  A bile sample was sent to the laboratory for culture.  Fundus of the gallbladder is grasped and retracted cephalad.  Dissection was begun at the neck of the gallbladder.  Peritoneum is incised.  Cystic duct is dissected out along its length.  A clip is placed at the neck of the gallbladder.  The cystic duct is incised and clear, gold bile emanates from the cystic duct.  A Cook cholangiography catheter is inserted through a stab wound in the right upper quadrant.  It is inserted without difficulty into the cystic duct and secured with a ligaclip.  Using C-arm fluoroscopy, real-time cholangiography is performed.  There is rapid filling of the common bile duct, which is not dilated.  There is free flow distally into the duodenum without obstruction or filling defect.  There is reflux of contrast into the right and left hepatic ductal systems.  The [facility] catheter is withdrawn.  The cystic duct is triply clipped and then divided.  Cystic artery is dissected out along its length, doubly clipped, and divided.  Posterior branch of the cystic artery is doubly clipped and divided.  Gallbladder is excised from the gallbladder bed using the hook electrocautery for hemostasis.  Gallbladder is completely excised and placed into an Endocatch bag.  The gallbladder is extracted through the umbilical port.  The gallbladder is essentially completely filled with stones. It is submitted fresh to pathology for review.  Right upper quadrant is copiously irrigated with warm saline, which is  evacuated.  Good hemostasis is noted.  Ports are removed under direct vision.  Pneumoperitoneum is released. Vicryl 0 pursestring suture is tied securely at the umbilicus.  All port sites are anesthetized with local anesthetic.  All wounds are closed with interrupted 4-0 Vicryl subcuticular sutures.  The wounds are washed and dried, and benzoin and Steri-Strips are applied.  Sterile gauze dressings are applied.  The patient is awakened from anesthesia and transported to the recovery room in stable condition.  The patient tolerated the procedure well. ictated by:   Velora Heckler, M.D. Attending Physician:  Brandy Hale DD:  03/03/01 TD:  03/04/01 Job: [id number]

## 2010-05-29 NOTE — H&P (Signed)
[facility]  Patient:    Rachael [name], Rachael [name] Visit Number: [id number] MRN: [id number]          Service Type: MED Location: [phone] Attending Physician:  Brandy Hale Dictated by:   Angelia Mould. Derrell Lolling, M.D. Admit Date:  03/02/2001 Discharge Date: 03/04/2001                           History and Physical  CHIEF COMPLAINT: Abdominal pain and vomiting.  HISTORY OF PRESENT ILLNESS: This is a 77 year old white female, who developed rather sudden onset epigastric pain at about 6:30 p.m. last night.  This persisted for several hours.  She had several episodes of vomiting and then later on dry heaves.  Denies back pain.  Denies fever, diarrhea, but does state she had some chills.  She came to the emergency room and was seen by Dr. Mariel Aloe.  Her pain has now resolved after receiving narcotic analgesics. She has had a gallbladder ultrasound which shows numerous gallstones.  The common bile duct measures 5-6 mm.  Otherwise, no abnormality.  She was very concerned about the intensity of her pain, and is requesting to be admitted to have gallbladder surgery.  Of note, she has had several episodes of this pain before but has never had a diagnosis.  She was hospitalized in 2001 with very similar symptoms and had a cardiac catheterization, and was told that was normal.  She denies relationship of this pain to meals.  She denies any history of liver disease, jaundice, or hepatitis.  Denies any history of cardiovascular disease, pulmonary disease, or other gastrointestinal disease.  PAST MEDICAL HISTORY:  1. Hospitalization in 2001 for epigastric pain.  Cardiac catheterization was     negative, according to the patient.  2. She has had five pregnancies and five deliveries.  3. She has had a lumbar laminectomy in 1983 for herniated disk.  4. She has a history of hypothyroidism.  CURRENT MEDICATIONS:  1. Armour Thyroid 1 g q.d.  2. Numerous  vitamins.  3. Nutritional supplements.  4. Herbs.  ALLERGIES: No known drug allergies.  SOCIAL HISTORY: [place]  She is divorced.  She has five children.  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 Denies the use of alcohol or tobacco.  FAMILY HISTORY: Mother living, has hypertension and cancer of the uterus. Father deceased at age 73 of myocardial infarction.  She has three brothers. One has had coronary artery bypass grafting and one has had total knee replacement.  REVIEW OF SYSTEMS: All systems were reviewed and are noncontributory except as described above.  PHYSICAL EXAMINATION:  GENERAL: Pleasant older woman, slightly overweight.  In no acute distress.  VITAL SIGNS: TEMP 96.6 degrees, pulse 58, respirations 28 initially - now down to about 16; blood pressure 138/60.  HEENT: Sclerae clear.  EOMI.  Oropharynx clear.  NECK: Supple, nontender.  No masses, no bruits, no thyromegaly.  LUNGS: Clear to auscultation.  No CVA tenderness.  Well-healed midline lumbar scar.  HEART: Regular rate and rhythm.  No murmur.  BREAST: Not examined.  ABDOMEN: Slightly obese, soft, nontender.  Diminished bowel sounds.  Not distended.  No mass.  No hernia.  No Murphys sign.  No tenderness to percussion over costal margin.  EXTREMITIES: Free range of motion.  No deformity.  Good pulses.  NEUROLOGIC: Grossly within normal limits.  LABORATORY DATA: Admission WBC 12,400, hemoglobin 13.8.  Liver function tests normal.  EKG is normal.  IMPRESSION:  1. Subacute cholecystitis with cholelithiasis.  2. History of hypothyroidism.  3. Status post lumbar laminectomy.  PLAN: The patient willl be admitted.  She wil be placed on IV antibiotics and kept NPO in anticipation of cholecystectomy either later today or tomorrow morning.  I have discussed the indications and details of cholecystectomy with her.  Risks and complications have been outlined  including, but not limited to, bleeding, infection, conversion to open laparotomy, injury to adjacent organs, injury to the main bile duct, bile leak, wound problems such as hernia or infection, and other unforeseen problems.  She seems to understand all these issues well.  At this time all her questions are answered.  She would like to be admitted and have this surgery.  She is understanding that her surgeon will most likely be another member of my group and says that is acceptable to her.Dictated by:   Angelia Mould. Derrell Lolling, M.D. Attending Physician:  Brandy Hale DD:  03/03/01 TD:  03/03/01 Job: [id number]

## 2010-05-29 NOTE — Op Note (Signed)
NAME:  Rachael [name], Rachael [name]                           ACCOUNT NO.:  [id number]   MEDICAL RECORD NO.:  [id number]                   PATIENT TYPE:  AMB   LOCATION:  DAY                                  FACILITY:  [facility]   PHYSICIAN:  Velora Heckler, M.D.                DATE OF BIRTH:  [birth year]   DATE OF PROCEDURE:  12/28/2001  DATE OF DISCHARGE:                                 OPERATIVE REPORT   PREOPERATIVE DIAGNOSES:  Ventral incisional hernia.   POSTOPERATIVE DIAGNOSES:  Ventral incisional hernia.   PROCEDURE:  Laparoscopic ventral incisional hernia repair with Gore-Tex dual  mesh.   SURGEON:  Velora Heckler, M.D.   ANESTHESIA:  General per Dr. Almeta Monas.   ESTIMATED BLOOD LOSS:  Minimal.   PREPARATION:  Betadine.   COMPLICATIONS:  None.   INDICATIONS FOR PROCEDURE:  The patient is a 77 year old white female who  had undergone laparoscopic cholecystectomy in February 2003. The patient  developed a significant bulge at the level of the umbilicus. This has  increased in size and has caused some discomfort. She has noted intermittent  constipation. She was seen in a clinic in [place], [place] and diagnosed  with ventral incisional hernia. The patient returns to our practice for  repair.   DESCRIPTION OF PROCEDURE:  The procedure was done in OR #11 at the [facility]. The patient was brought to the operating room,  placed in a supine position on the operating room table. Following the  administration of general anesthesia, the patient is positioned and then  prepped and draped in the usual strict aseptic fashion. After ascertaining  that an adequate level of anesthesia had been obtained, a right upper  quadrant incision was made with a #10 blade and dissection carried into the  subcutaneous tissues. The external oblique fascia is delineated. It is  incised for approximately 1.5 cm. A #0 Vicryl pursestring suture is placed.  The internal oblique muscle was divided  in line with its fibers. The  peritoneum is incised sharply and a Hasson cannula was introduced into the  peritoneal cavity and secured with a pursestring suture. The abdomen was  insufflated with carbon dioxide. The abdomen is then explored with the  laparoscope. There are no significant adhesions to the anterior abdominal  wall. There is approximately a 4-5 cm hernia defect in the midline at the  level of the umbilicus. The operating port was placed in the right lower  quadrant through a 5 mm site. Next, the Gore-Tex dual mesh was brought on  the field. The limits of the hernia defect are marked with spinal needles. A  4 cm overlap was measured. The mesh size measures 15 cm x 15 cm. Therefore a  15 x 19 cm sheet of Gore-Tex dual mesh is turned to the appropriate  dimensions. It is oriented on the abdominal wall marking the cardinal points  of the compass circumferentially on the mesh and the abdominal wall for  orientation purposes. Zero Novofil sutures are then placed in eight  positions around the margins of the mesh. Corresponding incisions are made  on the abdominal wall. The mesh is rolled and inserted into the peritoneal  cavity. It is unrolled and oriented properly with the course side of the  Gore-Tex towards the abdominal wall and the smooth side towards the viscera.  Using the EndoCatch, these sutures are retrieved and brought through the  abdominal wall. After retrieving all eight sutures, these sutures are pulled  taut bringing the Gore-Tex dual mesh nicely into proximity of the abdominal  wall and widely overlapping the hernia defect. Each suture is tied securely,  divided and subcutaneous tissues elevated with a hemostat. Next, the mesh is  affixed to the abdominal wall tightly using the Ethicon tacking device. Two  concentric rows of tacks are placed around the edges of the Gore-Tex dual  mesh. There is nice approximation of the dual mesh to the anterior abdominal  wall.  Good hemostasis is noted. Pneumoperitoneum is released and the  laparoscope is used to observe the mesh coming into contact with the  abdominal viscera. All CO2 that is possible to evacuate is evacuated. Ports  are then removed. An #0 Vicryl pursestring suture is tied securely. Lateral  surgical wounds are closed with interrupted 4-0 Vicryl subcuticular sutures.  All wounds are washed and dried. The wounds are anesthetized with local  anesthetic. All wounds are closed with Steri-Strips and Benzoin. Sterile  dressings are applied. The patient is awakened from anesthesia and brought  to the recovery room in stable condition. The patient tolerated the  procedure well.                                               Velora Heckler, M.D.    TMG/MEDQ  D:  12/28/2001  T:  12/28/2001  Job:  [id number]

## 2010-07-06 ENCOUNTER — Encounter: Payer: Self-pay | Admitting: Cardiology

## 2010-07-07 ENCOUNTER — Encounter: Payer: Self-pay | Admitting: Internal Medicine

## 2010-07-07 ENCOUNTER — Ambulatory Visit (INDEPENDENT_AMBULATORY_CARE_PROVIDER_SITE_OTHER): Payer: Medicare Other | Admitting: Internal Medicine

## 2010-07-07 VITALS — BP 140/68 | HR 51 | Temp 97.3°F | Ht 69.0 in | Wt 257.8 lb

## 2010-07-07 DIAGNOSIS — K439 Ventral hernia without obstruction or gangrene: Secondary | ICD-10-CM

## 2010-07-07 DIAGNOSIS — Z79899 Other long term (current) drug therapy: Secondary | ICD-10-CM

## 2010-07-07 NOTE — Patient Instructions (Signed)
It was good to see you today. if your symptoms continue to worsen (severe pain, vomitting, etc), or if you are unable take anything by mouth (pills, fluids, etc), you should go to the emergency room for further evaluation and treatment. we'll make referral for CT scan to Dr. Gerrit Friends for your hernia . Our office will contact you regarding appointment(s) once made. Take it easy until then - no lifting, heavy exercise or straining

## 2010-07-07 NOTE — Progress Notes (Signed)
Subjective:    Patient ID: Rachael [name], female    DOB: 25-Jan-1933, 77 y.o.   MRN: [id number]  HPI complains of right abdominal wall bulge Noted problem yesterday Has felt "ache" like pulled muscle in this area x 3 days Precipitated by increase in exercise routine and heavy yardwork/lifting No nausea and vomiting, pain mild 2-3/10 - no change in bowels   Also reviewed chronic medical issues:  hypothyroid - follows with endo in [place] for same - recent labs "ok" - no change skin bowels - reports compliance with ongoing medical treatment and no changes in medication dose or frequency.  denies adverse side effects related to current therapy. frustrated with inability to lose weight  dyslipidemia - reports compliance with ongoing medical treatment and no changes in medication dose or frequency. denies adverse side effects related to current therapy.   chronic venous insuff - uses diuretics daily - no hx HTN -   Past Medical History  Diagnosis Date  . Chronic venous insufficiency   . PVD (peripheral vascular disease)     mild carotid 11/05  . Anxiety   . Osteoarthritis     bilat hips, severe  . Obesity   . CONTACT DERMATITIS   . GLUCOSE INTOLERANCE   . VITAMIN D DEFICIENCY   . Edema   . Headache   . CAROTID STENOSIS   . DUPUYTREN'S CONTRACTURE   . MENOPAUSAL SYNDROME   . HLD (hyperlipidemia)   . Unspecified Myalgia and Myositis   . Hypothyroidism     Dr. Cherlyn Roberts in [place] endo     Review of Systems  Constitutional: Positive for fatigue. Negative for fever.  Cardiovascular: Negative for chest pain.  Gastrointestinal: Negative for nausea, vomiting, diarrhea and constipation.  Genitourinary: Negative for dysuria.       Objective:   Physical Exam BP 140/68  Pulse 51  Temp(Src) 97.3 F (36.3 C) (Oral)  Ht 5\' 9"  (1.753 m)  Wt 257 lb 12.8 oz (116.937 kg)  BMI 38.07 kg/m2  SpO2 96% Physical Exam  Constitutional: She is obese; oriented to person, place, and  time. She appears well-developed and well-nourished. No distress.  Cardiovascular: Normal rate, regular rhythm and normal heart sounds.  No murmur heard. 1+ chronic BLE edema. Pulmonary/Chest: Effort normal and breath sounds normal. No respiratory distress. She has no wheezes.  Abdominal: Soft. Bowel sounds are normal. She exhibits no distension. There is no tenderness.  + soft R abd wall hernia 5-6 cm round Neurological: She is alert and oriented to person, place, and time. No cranial nerve deficit. Coordination normal.  Skin: Skin is warm and dry. No rash noted. No erythema.  Psychiatric: She has a normal mood and affect. Her behavior is normal. Judgment and thought content normal.       Lab Results  Component Value Date   WBC 10.3 11/06/2009   HGB 13.4 11/06/2009   HCT 39.2 11/06/2009   PLT 287.0 11/06/2009   CHOL 177 06/13/2009   TRIG 233.0* 06/13/2009   HDL 58.90 06/13/2009   LDLDIRECT 95.5 06/13/2009   ALT 24 06/13/2009   AST 30 06/13/2009   NA 131* 11/06/2009   K 3.8 11/06/2009   CL 96 11/06/2009   CREATININE 1.3* 11/06/2009   BUN 21 11/06/2009   CO2 27 11/06/2009   TSH 0.55 11/06/2009   INR 1.40 11/16/2008   HGBA1C 6.0 03/29/2008     Assessment & Plan:  Right abdominal wall hernia - soft, no signs incarceration - mild discomfort, no  GI symptoms  Education provided to pt on same - hx prior umbilical hernia following GB surg Refer for CT to eval (IP procedure due to iodine allergy hx and prior pretx for contrast) Also refer to gen surg for definitive tx - pt requests dr. Gerrit Friends as before Pt will go to ER if worse (increase pain, fever, vomiting or other problems)

## 2010-07-08 ENCOUNTER — Other Ambulatory Visit (INDEPENDENT_AMBULATORY_CARE_PROVIDER_SITE_OTHER): Payer: Medicare Other

## 2010-07-08 DIAGNOSIS — Z79899 Other long term (current) drug therapy: Secondary | ICD-10-CM

## 2010-07-08 DIAGNOSIS — K439 Ventral hernia without obstruction or gangrene: Secondary | ICD-10-CM

## 2010-07-08 LAB — BUN: BUN: 33 mg/dL — ABNORMAL HIGH (ref 6–23)

## 2010-07-10 ENCOUNTER — Ambulatory Visit (HOSPITAL_COMMUNITY)
Admission: RE | Admit: 2010-07-10 | Discharge: 2010-07-10 | Disposition: A | Discharge status: Home / Self Care | Payer: Medicare Other | Source: Ambulatory Visit | Attending: Internal Medicine | Admitting: Internal Medicine

## 2010-07-10 DIAGNOSIS — K439 Ventral hernia without obstruction or gangrene: Secondary | ICD-10-CM

## 2010-07-10 DIAGNOSIS — R1011 Right upper quadrant pain: Secondary | ICD-10-CM | POA: Insufficient documentation

## 2010-07-10 DIAGNOSIS — R10811 Right upper quadrant abdominal tenderness: Secondary | ICD-10-CM | POA: Insufficient documentation

## 2010-07-10 DIAGNOSIS — Q619 Cystic kidney disease, unspecified: Secondary | ICD-10-CM | POA: Insufficient documentation

## 2010-07-10 MED ORDER — IOHEXOL 300 MG/ML  SOLN
100.0000 mL | Freq: Once | INTRAMUSCULAR | Status: AC | PRN
  Administered 2010-07-10: 100 mL via INTRAVENOUS

## 2010-07-13 ENCOUNTER — Ambulatory Visit (INDEPENDENT_AMBULATORY_CARE_PROVIDER_SITE_OTHER): Payer: Medicare Other | Admitting: Surgery

## 2010-07-13 ENCOUNTER — Encounter (INDEPENDENT_AMBULATORY_CARE_PROVIDER_SITE_OTHER): Payer: Self-pay | Admitting: Surgery

## 2010-07-13 VITALS — BP 132/82 | HR 60 | Ht 69.0 in | Wt 257.0 lb

## 2010-07-13 DIAGNOSIS — K430 Incisional hernia with obstruction, without gangrene: Secondary | ICD-10-CM | POA: Insufficient documentation

## 2010-07-13 NOTE — Progress Notes (Signed)
HISTORY: The patient presents with a soft tissue mass in the right upper quadrant of the abdominal wall. She has had a previous laparoscopic cholecystectomy approximately 6 years ago. With recent physical activity she has noted discomfort and an enlargement in the bulge in the right upper quadrant of the abdominal wall. She has had mild nausea. She was referred for a CT scan of the abdomen and pelvis which was performed on June 26. This shows a hernia in the right upper quadrant containing only fat. The fascial defect is approximately 2 cm in size. The patient presents today for evaluation for repair of hernia.  Past Medical History  Diagnosis Date  . Chronic venous insufficiency   . PVD (peripheral vascular disease)     mild carotid 11/05  . Anxiety   . Osteoarthritis     bilat hips, severe  . Obesity   . CONTACT DERMATITIS   . GLUCOSE INTOLERANCE   . VITAMIN D DEFICIENCY   . Edema   . Headache   . CAROTID STENOSIS   . DUPUYTREN'S CONTRACTURE   . MENOPAUSAL SYNDROME   . HLD (hyperlipidemia)   . Unspecified Myalgia and Myositis   . Hypothyroidism     Dr. Cherlyn Roberts in [place] endo    Current Outpatient Prescriptions on File Prior to Visit  Medication Sig Dispense Refill  . Ascorbic Acid (VITAMIN C) 1000 MG tablet Take 1,000 mg by mouth daily.        . Coenzyme Q10 (COQ10) 150 MG CAPS Take 1 capsule by mouth daily.        Chilton Si Tea, Camillia sinensis, 250 MG CAPS Take 1 capsule by mouth daily.        Marland Kitchen levothyroxine (SYNTHROID, LEVOTHROID) 137 MCG tablet Take 137 mcg by mouth daily.        . Multiple Vitamin (MULTIVITAMIN) tablet Take 1 tablet by mouth daily.        . Multiple Vitamins-Minerals (K-PAX IMMUNE PROFESSIONAL ST) TABS Take 2 tablets by mouth daily.        . Omega-3 350 MG CAPS Take 2 capsules by mouth daily.        Marland Kitchen triamterene-hydrochlorothiazide (DYAZIDE) 37.5-25 MG per capsule Take 1 capsule by mouth daily.        . vitamin E 400 UNIT capsule Take 400 Units by  mouth daily.         No Known Allergies   PERTINENT REVIEW OF SYSTEMS:  The patient notes discomfort at the site of hernia with palpation. She notes that it does restrict her physical activity. She has had some nausea. The hernia has not been reducible.  EXAM: HEENT shows her to be normocephalic. Sclerae are clear. Pupils 3: Reactive. Dentition fair. Mucous membranes moist. Chest is clear to auscultation bilaterally without rales rhonchi or wheeze Cardiac exam shows regular rate and rhythm without murmur. Peripheral pulses are full. Abdomen is soft, obese, with well-healed surgical incision. There is an obvious bandage in the right upper quadrant just below the costal margin in the midclavicular line. On palpation this is mildly tender. It is not reducible. Extremities are nontender. 1+ edema at the ankles Neurologic exam is intact. No focal neurologic deficits. No sign of tremor.   IMPRESSION: Likely incisional hernia at site of right upper quadrant trocar insertion sites utilized during laparoscopic cholecystectomy.  Current hernia is incarcerated.It is mildly symptomatic.   PLAN: I discussed operative repair of the incarcerated incisional hernia with the patient at length. We discussed the use of  prosthetic mesh. We discussed the potential for recurrence being approximately 10%. We discussed restrictions on her activities after surgery and her return to work. We discussed the possibility of a subcutaneous drain being left in position.  Patient would like to proceed with surgery. We will make arrangements at [facility]. We will plan on an overnight stay. Patient understands and agrees to proceed.

## 2010-08-03 ENCOUNTER — Telehealth: Payer: Self-pay | Admitting: Cardiology

## 2010-08-03 NOTE — Telephone Encounter (Signed)
12 lead faxed to [facility] [phone]  08/03/10/km

## 2010-08-07 ENCOUNTER — Other Ambulatory Visit (INDEPENDENT_AMBULATORY_CARE_PROVIDER_SITE_OTHER): Payer: Self-pay | Admitting: Surgery

## 2010-08-07 ENCOUNTER — Encounter (HOSPITAL_COMMUNITY): Payer: Medicare Other

## 2010-08-07 ENCOUNTER — Ambulatory Visit (HOSPITAL_COMMUNITY)
Admission: RE | Admit: 2010-08-07 | Discharge: 2010-08-07 | Disposition: A | Discharge status: Home / Self Care | Payer: Medicare Other | Source: Ambulatory Visit | Attending: Surgery | Admitting: Surgery

## 2010-08-07 DIAGNOSIS — Z01818 Encounter for other preprocedural examination: Secondary | ICD-10-CM

## 2010-08-07 DIAGNOSIS — M47814 Spondylosis without myelopathy or radiculopathy, thoracic region: Secondary | ICD-10-CM | POA: Insufficient documentation

## 2010-08-07 DIAGNOSIS — Z01812 Encounter for preprocedural laboratory examination: Secondary | ICD-10-CM | POA: Insufficient documentation

## 2010-08-07 LAB — URINALYSIS, ROUTINE W REFLEX MICROSCOPIC
Glucose, UA: NEGATIVE mg/dL
Ketones, ur: NEGATIVE mg/dL
Specific Gravity, Urine: 1.019 (ref 1.005–1.030)
pH: 6 (ref 5.0–8.0)

## 2010-08-07 LAB — CBC
MCHC: 33.9 g/dL (ref 30.0–36.0)
Platelets: 287 10*3/uL (ref 150–400)
RDW: 13.7 % (ref 11.5–15.5)
WBC: 10.7 10*3/uL — ABNORMAL HIGH (ref 4.0–10.5)

## 2010-08-07 LAB — DIFFERENTIAL
Basophils Absolute: 0 10*3/uL (ref 0.0–0.1)
Basophils Relative: 0 % (ref 0–1)
Eosinophils Absolute: 0.3 10*3/uL (ref 0.0–0.7)
Eosinophils Relative: 3 % (ref 0–5)
Lymphocytes Relative: 30 % (ref 12–46)
Monocytes Absolute: 1.4 10*3/uL — ABNORMAL HIGH (ref 0.1–1.0)

## 2010-08-07 LAB — BASIC METABOLIC PANEL
CO2: 27 mEq/L (ref 19–32)
Chloride: 100 mEq/L (ref 96–112)
Creatinine, Ser: 1.11 mg/dL — ABNORMAL HIGH (ref 0.50–1.10)
Potassium: 4 mEq/L (ref 3.5–5.1)

## 2010-08-07 LAB — URINE MICROSCOPIC-ADD ON

## 2010-08-07 LAB — SURGICAL PCR SCREEN
MRSA, PCR: NEGATIVE
Staphylococcus aureus: POSITIVE — AB

## 2010-08-13 ENCOUNTER — Observation Stay (HOSPITAL_COMMUNITY)
Admission: RE | Admit: 2010-08-13 | Discharge: 2010-08-14 | Disposition: A | Discharge status: Home / Self Care | Payer: Medicare Other | Source: Ambulatory Visit | Attending: Surgery | Admitting: Surgery

## 2010-08-13 DIAGNOSIS — E669 Obesity, unspecified: Secondary | ICD-10-CM | POA: Insufficient documentation

## 2010-08-13 DIAGNOSIS — Z9089 Acquired absence of other organs: Secondary | ICD-10-CM | POA: Insufficient documentation

## 2010-08-13 DIAGNOSIS — Z01812 Encounter for preprocedural laboratory examination: Secondary | ICD-10-CM | POA: Insufficient documentation

## 2010-08-13 DIAGNOSIS — K430 Incisional hernia with obstruction, without gangrene: Secondary | ICD-10-CM

## 2010-08-13 DIAGNOSIS — Z01818 Encounter for other preprocedural examination: Secondary | ICD-10-CM | POA: Insufficient documentation

## 2010-08-13 LAB — PROTIME-INR: Prothrombin Time: 13.2 seconds (ref 11.6–15.2)

## 2010-08-14 NOTE — Op Note (Signed)
NAME:  Rachael [name], GATO NO.:  [id number]  MEDICAL RECORD NO.:  [id number]  LOCATION:  1527                         FACILITY:  [facility]  PHYSICIAN:  Velora Heckler, MD      DATE OF BIRTH:  Dec 09, 1933  DATE OF PROCEDURE:  08/13/2010                               OPERATIVE REPORT   PREOPERATIVE DIAGNOSIS:  Incarcerated incisional hernia, right upper quadrant abdominal wall.  POSTOPERATIVE DIAGNOSIS:  Incarcerated incisional hernia, right upper quadrant abdominal wall.  PROCEDURE:  Repair of incarcerated incisional hernia, right upper quadrant abdominal wall with polypropylene mesh.  SURGEON:  Velora Heckler, M.D., FACS  ANESTHESIA:  General per Dr. Lucille Passy.  ESTIMATED BLOOD LOSS:  Minimal.  PREPARATION:  ChloraPrep.  COMPLICATIONS:  None.  INDICATIONS:  The patient is a 77 year old white female.  She had undergone a previous laparoscopic cholecystectomy.  She had noted development of a hernia in the right upper quadrant at the site of one of her trocars.  This had gradually increased in size and caused discomfort.  CT scan of the abdomen and pelvis demonstrated a right upper quadrant abdominal wall hernia containing fatty tissue.  The patient now comes to surgery for repair.  BODY OF REPORT:  Procedure was done in OR #1 at [facility].  The patient was brought to the operating room, placed in supine position on the operating room table.  Following administration of general anesthesia, the patient was prepared in the usual strict aseptic fashion.  Drapes were applied.  After ascertaining that an adequate level of anesthesia been achieved, the previous incision in the right upper quadrant was extended medially and laterally into the subcutaneous tissues.  Hemostasis was obtained with the electrocautery. Dissection was carried down to the abdominal wall where there was a large approximately 5 cm hernia sac present.  This was  dissected out of the surrounding adipose tissue down to the level of the fascia.  Fascial defect measures approximately 3 cm in diameter.  Hernia sac was excised and discarded.  Hernia contains incarcerated omentum, which was freed from the edges of the hernia defect and reduced back within the peritoneal cavity.  Defect was then closed transversely with interrupted #1 Novofil simple sutures.  The plane between the internal oblique and external oblique was developed circumferentially.  A 3 x 5 inch sheet of polypropylene mesh was then trimmed slightly and placed into the layer between the internal oblique and external oblique.  It was secured to the fascia with interrupted 0 Novofil simple sutures.  External oblique fascia was then closed over the repair with interrupted 0 Novofil simple sutures. Subcutaneous tissues were irrigated and hemostasis was obtained. Subcutaneous tissues were closed with interrupted 2-0 Vicryl sutures. Skin was closed with stainless steel staples.  Sterile dressings were applied.  The patient was awakened from anesthesia and brought to the recovery room.  The patient tolerated the procedure well.   Velora Heckler, MD, FACS     TMG/MEDQ  D:  08/13/2010  T:  08/13/2010  Job:  [id number]  cc:   Vikki Ports A. Felicity Coyer, MD [address] [place], [place] [address]  Electronically Signed by  Darnell Level MD on 08/14/2010 10:04:26 AM

## 2010-08-18 ENCOUNTER — Encounter (INDEPENDENT_AMBULATORY_CARE_PROVIDER_SITE_OTHER): Payer: Self-pay | Admitting: Surgery

## 2010-08-19 ENCOUNTER — Encounter (INDEPENDENT_AMBULATORY_CARE_PROVIDER_SITE_OTHER): Payer: Self-pay | Admitting: Surgery

## 2010-08-19 ENCOUNTER — Ambulatory Visit (INDEPENDENT_AMBULATORY_CARE_PROVIDER_SITE_OTHER): Payer: Medicare Other | Admitting: Surgery

## 2010-08-19 DIAGNOSIS — K430 Incisional hernia with obstruction, without gangrene: Secondary | ICD-10-CM

## 2010-08-19 NOTE — Progress Notes (Signed)
Visit Diagnoses: 1. Incisional hernia, incarcerated, right upper quadrant     HISTORY: The patient is a 77 year old female who underwent repair of incisional hernia on August 13, 2010. She returns for her first postoperative visit.   EXAM: Right upper quadrant surgical wound is healing without complication. Staples were removed and Steri-Strips are applied.   IMPRESSION: Status post incisional hernia repair with mesh.   PLAN: Patient will return in 6 weeks for final wound check. Local wound care instructions are given. Patient will begin applying topical creams to the incision when the Steri-Strips are removed.   Velora Heckler, MD, FACS General & Endocrine Surgery [facility]

## 2010-08-24 ENCOUNTER — Telehealth (INDEPENDENT_AMBULATORY_CARE_PROVIDER_SITE_OTHER): Payer: Self-pay

## 2010-08-24 NOTE — Telephone Encounter (Signed)
ERROR

## 2010-08-25 ENCOUNTER — Encounter (INDEPENDENT_AMBULATORY_CARE_PROVIDER_SITE_OTHER): Payer: Medicare Other | Admitting: Surgery

## 2010-08-25 NOTE — Discharge Summary (Signed)
  NAME:  JAYDEE, INGMAN NO.:  [id number]  MEDICAL RECORD NO.:  [id number]  LOCATION:  1527                         FACILITY:  [facility]  PHYSICIAN:  Velora Heckler, MD      DATE OF BIRTH:  Apr 28, 1933  DATE OF ADMISSION:  08/13/2010 DATE OF DISCHARGE:  08/14/2010                              DISCHARGE SUMMARY   REASON FOR ADMISSION:  Incarcerated ventral incisional hernia.  BRIEF HISTORY:  The patient is a 77 year old white female with incarcerated incisional hernia in the right upper quadrant of the abdominal wall at the site of previous laparoscopic cholecystectomy.  CT scan confirms hernia containing fatty tissue.  She now comes to surgery for repair.  HOSPITAL COURSE:  The patient was admitted on August 2nd.  She was taken to the operating room where she underwent repair of right upper quadrant abdominal wall incisional hernia with mesh.  Postoperative course was uneventful.  Pain was well-controlled.  The patient was prepared for discharge home on the morning following surgery.  DISCHARGE/PLAN:  The patient is discharged home on August 14, 2010.  She is tolerating a regular diet.  Pain is well-controlled.  She will return to my office on August 14th, for wound check and staple removal.  DISCHARGE MEDICATIONS:  Include Vicodin as needed for pain.  FINAL DIAGNOSIS:  Incarcerated incisional hernia.  DISCHARGE CONDITION:  Good.     Velora Heckler, MD     TMG/MEDQ  D:  08/14/2010  T:  08/15/2010  Job:  [id number]  cc:   Vikki Ports A. Felicity Coyer, MD [address] [place], [place] [address]  Velora Heckler, MD [address] [place] [address]  Electronically Signed by Darnell Level MD on 08/25/2010 08:51:20 PM

## 2010-08-27 ENCOUNTER — Telehealth: Payer: Self-pay

## 2010-08-27 DIAGNOSIS — R300 Dysuria: Secondary | ICD-10-CM

## 2010-08-27 NOTE — Telephone Encounter (Signed)
Pt called stating she has been experiencing mild RT flank pain and dysuria. Pt is concerned she may have a UTI and is requesting order for UA, okay to order?

## 2010-08-27 NOTE — Telephone Encounter (Signed)
Pt advised.

## 2010-08-27 NOTE — Telephone Encounter (Signed)
Yes - order in EMR

## 2010-08-28 ENCOUNTER — Telehealth (INDEPENDENT_AMBULATORY_CARE_PROVIDER_SITE_OTHER): Payer: Self-pay

## 2010-08-28 ENCOUNTER — Other Ambulatory Visit: Payer: Medicare Other

## 2010-08-28 ENCOUNTER — Other Ambulatory Visit: Payer: Self-pay | Admitting: Internal Medicine

## 2010-08-28 DIAGNOSIS — R300 Dysuria: Secondary | ICD-10-CM

## 2010-08-28 LAB — URINALYSIS, ROUTINE W REFLEX MICROSCOPIC
Ketones, ur: NEGATIVE
Total Protein, Urine: NEGATIVE
Urine Glucose: NEGATIVE
Urobilinogen, UA: 0.2 (ref 0.0–1.0)

## 2010-08-28 NOTE — Telephone Encounter (Signed)
Returned patient phone call she had already spoken to someone it triage and seen her primary care provider. No other  Information given. Patient advised to call back if she need further assistance. I also informed the patient Dr. Gerrit Friends received Dr. Biagio Quint message from last weekend.

## 2010-09-28 ENCOUNTER — Encounter: Payer: Self-pay | Admitting: Endocrinology

## 2010-09-28 ENCOUNTER — Ambulatory Visit (INDEPENDENT_AMBULATORY_CARE_PROVIDER_SITE_OTHER): Payer: Medicare Other | Admitting: Endocrinology

## 2010-09-28 VITALS — BP 132/62 | HR 60 | Temp 98.3°F | Ht 68.0 in | Wt 266.8 lb

## 2010-09-28 DIAGNOSIS — IMO0002 Reserved for concepts with insufficient information to code with codable children: Secondary | ICD-10-CM

## 2010-09-28 DIAGNOSIS — S40812A Abrasion of left upper arm, initial encounter: Secondary | ICD-10-CM

## 2010-09-28 NOTE — Patient Instructions (Signed)
Twice a day, clean the scratched area clean by gently running soapy water over it.  Then run clear water, and gently dry.  Then apply antibiotic ointment, and a large bandaid.   I hope you feel better soon.  If you don't feel better by next week, please call doctor leschber.

## 2010-09-28 NOTE — Progress Notes (Signed)
Subjective:    Patient ID: Rachael [name], female    DOB: February 01, 1933, 77 y.o.   MRN: [id number]  HPI Today, pt's left forearm was scratched by a friend's dog.  She has a few hrs of slight pain there, and assoc bleeding.  Pt had td in 2009.   Past Medical History  Diagnosis Date  . Chronic venous insufficiency   . PVD (peripheral vascular disease)     mild carotid 11/05  . Anxiety   . Osteoarthritis     bilat hips, severe  . Obesity   . CONTACT DERMATITIS   . GLUCOSE INTOLERANCE   . VITAMIN D DEFICIENCY   . Edema   . Headache   . CAROTID STENOSIS   . DUPUYTREN'S CONTRACTURE   . MENOPAUSAL SYNDROME   . HLD (hyperlipidemia)   . Unspecified Myalgia and Myositis   . Hypothyroidism     Dr. Cherlyn Roberts in [place] [facility]     Past Surgical History  Procedure Date  . 2d echo 11/29/03    normal LV size. normal EF   . Normal caronary arteries     per pt. 2003  . Cholecystectomy   . Inguinal hernia repair   . Tonsillectomy   . Back surgery 1983  . Right rotator cuff surgery   . Total hip arthroplasty 05/2008 and 10/2008    B hip - alusio    History   Social History  . Marital Status: Divorced    Spouse Name: N/A    Number of Children: N/A  . Years of Education: N/A   Occupational History  . Not on file.   Social History Main Topics  . Smoking status: Never Smoker   . Smokeless tobacco: Not on file   Comment: Divorced, lives with 32 yo mother  . Alcohol Use: No  . Drug Use: No  . Sexually Active: Not on file   Other Topics Concern  . Not on file   Social History Narrative   Divorced, lives with 55 yo mother.Work-sales. Water aerobics 6/week- 3d water, 3d cardio.     Current Outpatient Prescriptions on File Prior to Visit  Medication Sig Dispense Refill  . Ascorbic Acid (VITAMIN C) 1000 MG tablet Take 1,000 mg by mouth daily.        . Coenzyme Q10 (COQ10) 150 MG CAPS Take 1 capsule by mouth daily.        Chilton Si Tea, Camillia sinensis, 250 MG CAPS Take 1 capsule  by mouth daily.        Marland Kitchen levothyroxine (SYNTHROID, LEVOTHROID) 137 MCG tablet Take 137 mcg by mouth daily.        . Multiple Vitamin (MULTIVITAMIN) tablet Take 1 tablet by mouth daily.        . Multiple Vitamins-Minerals (K-PAX IMMUNE PROFESSIONAL ST) TABS Take 2 tablets by mouth daily.        . Omega-3 350 MG CAPS Take 2 capsules by mouth daily.        Marland Kitchen triamterene-hydrochlorothiazide (DYAZIDE) 37.5-25 MG per capsule Take 1 capsule by mouth daily.        . vitamin E 400 UNIT capsule Take 400 Units by mouth daily.          Allergies  Allergen Reactions  . Shellfish Allergy Anaphylaxis and Hives    Hives all over the body.  Marland Kitchen Percocet (Oxycodone-Acetaminophen) Hives    Family History  Problem Relation Age of Onset  . Coronary artery disease      family hx -  female 1st degree relative <60  . Hypothyroidism      aunt     BP 132/62  Pulse 60  Temp(Src) 98.3 F (36.8 C) (Oral)  Ht 5\' 8"  (1.727 m)  Wt 266 lb 12.8 oz (121.02 kg)  BMI 40.57 kg/m2  SpO2 96%  Review of Systems Denies fever.      Objective:   Physical Exam VITAL SIGNS:  See vs page GENERAL: no distress Left forearm: 2 parallel transverse scratches, each 5 cm long.  There is adjacent minimal skin avulsion.  No bleeding now.      Assessment & Plan:  Abrasion, new

## 2010-10-06 ENCOUNTER — Encounter (INDEPENDENT_AMBULATORY_CARE_PROVIDER_SITE_OTHER): Payer: Self-pay | Admitting: Surgery

## 2010-10-06 ENCOUNTER — Ambulatory Visit (INDEPENDENT_AMBULATORY_CARE_PROVIDER_SITE_OTHER): Payer: Medicare Other | Admitting: Surgery

## 2010-10-06 VITALS — BP 148/78 | HR 64 | Temp 97.5°F | Resp 16 | Ht 68.5 in | Wt 265.2 lb

## 2010-10-06 DIAGNOSIS — K430 Incisional hernia with obstruction, without gangrene: Secondary | ICD-10-CM

## 2010-10-06 NOTE — Patient Instructions (Signed)
  COCOA BUTTER & VITAMIN E CREAM  (Palmer's or other brand)  Apply cocoa butter/vitamin E cream to your incision 2 - 3 times daily.  Massage cream into incision for one minute with each application.  Use sunscreen (50 SPF or higher) for first 6 months after surgery.  You may substitute Mederma or other scar reducing creams as desired.   

## 2010-10-06 NOTE — Progress Notes (Signed)
Visit Diagnoses: 1. Incisional hernia, incarcerated, right upper quadrant     HISTORY: Patient returns for followup having undergone repair of right upper quadrant incisional hernia with mesh. Patient has continued to slowly improve. She is anxious to return to her exercise routine.   EXAM: Surgical wound is well-healed. No sign of infection. No sign of seroma. With Valsalva there is no sign of recurrent hernia.   IMPRESSION: Status post repair of incarcerated incisional hernia, no evidence of recurrence   PLAN: Patient is released to full activity without restriction. She will continue to apply topical creams to her incision. She will return to see me as needed.   Velora Heckler, MD, FACS General & Endocrine Surgery [facility]

## 2010-10-09 LAB — DIFFERENTIAL
Eosinophils Absolute: 0.2
Eosinophils Relative: 2
Lymphs Abs: 2.7
Monocytes Absolute: 1.3 — ABNORMAL HIGH

## 2010-10-09 LAB — POCT I-STAT, CHEM 8
BUN: 37 — ABNORMAL HIGH
Calcium, Ion: 1.25
Chloride: 103
Glucose, Bld: 121 — ABNORMAL HIGH
HCT: 41

## 2010-10-09 LAB — POCT CARDIAC MARKERS
CKMB, poc: 2.3
Myoglobin, poc: 131
Operator id: 192351
Troponin i, poc: <0.05
Troponin i, poc: <0.05

## 2010-10-09 LAB — CBC
HCT: 40.9
Hemoglobin: 13.9
MCV: 87.5
Platelets: 311
RDW: 13.4

## 2010-10-09 LAB — D-DIMER, QUANTITATIVE: D-Dimer, Quant: 0.33

## 2010-10-09 LAB — PROTIME-INR: INR: 1

## 2010-11-23 ENCOUNTER — Telehealth: Payer: Self-pay

## 2010-11-23 NOTE — Telephone Encounter (Signed)
Noted same and agree with the advice as given. Thanks

## 2010-11-23 NOTE — Telephone Encounter (Signed)
Pt called stating she woke this morning with pain across her shoulder that goes up to the back of her head. Pt requested an appt with VAL or any available MD today but there are no appt. Pt was advised to go to her local ER or UC for eval of her sxs especially because of the radiation to her head. Pt agreed to go now.

## 2010-12-16 ENCOUNTER — Other Ambulatory Visit: Payer: Self-pay | Admitting: Neurological Surgery

## 2010-12-16 DIAGNOSIS — M542 Cervicalgia: Secondary | ICD-10-CM

## 2010-12-18 ENCOUNTER — Other Ambulatory Visit: Payer: Self-pay | Admitting: Neurological Surgery

## 2010-12-18 ENCOUNTER — Inpatient Hospital Stay: Admission: RE | Admit: 2010-12-18 | Payer: Medicare Other | Source: Ambulatory Visit

## 2010-12-18 ENCOUNTER — Other Ambulatory Visit: Payer: Medicare Other

## 2010-12-18 ENCOUNTER — Ambulatory Visit
Admission: RE | Admit: 2010-12-18 | Discharge: 2010-12-18 | Disposition: A | Discharge status: Home / Self Care | Payer: Medicare Other | Source: Ambulatory Visit | Attending: Neurological Surgery | Admitting: Neurological Surgery

## 2010-12-18 DIAGNOSIS — M542 Cervicalgia: Secondary | ICD-10-CM

## 2010-12-19 ENCOUNTER — Other Ambulatory Visit: Payer: Medicare Other

## 2011-01-13 ENCOUNTER — Other Ambulatory Visit: Payer: Self-pay | Admitting: Dermatology

## 2011-02-24 ENCOUNTER — Ambulatory Visit: Payer: Medicare Other | Admitting: Internal Medicine

## 2011-04-15 ENCOUNTER — Other Ambulatory Visit: Payer: Self-pay | Admitting: Cardiology

## 2011-04-15 DIAGNOSIS — I6529 Occlusion and stenosis of unspecified carotid artery: Secondary | ICD-10-CM

## 2011-04-19 ENCOUNTER — Encounter (INDEPENDENT_AMBULATORY_CARE_PROVIDER_SITE_OTHER): Payer: Medicare Other

## 2011-04-19 DIAGNOSIS — I6529 Occlusion and stenosis of unspecified carotid artery: Secondary | ICD-10-CM

## 2011-05-11 ENCOUNTER — Other Ambulatory Visit: Payer: Self-pay | Admitting: Radiology

## 2011-05-11 HISTORY — PX: BREAST BIOPSY: SHX20

## 2011-05-12 ENCOUNTER — Encounter (INDEPENDENT_AMBULATORY_CARE_PROVIDER_SITE_OTHER): Payer: Self-pay | Admitting: General Surgery

## 2011-05-12 ENCOUNTER — Other Ambulatory Visit: Payer: Self-pay | Admitting: Radiology

## 2011-05-12 ENCOUNTER — Telehealth (INDEPENDENT_AMBULATORY_CARE_PROVIDER_SITE_OTHER): Payer: Self-pay | Admitting: General Surgery

## 2011-05-12 DIAGNOSIS — C50911 Malignant neoplasm of unspecified site of right female breast: Secondary | ICD-10-CM

## 2011-05-12 NOTE — Telephone Encounter (Signed)
Left message on pt voicemail explaining that I got her an apt to see Dr. Carolynne Edouard on 5/2 at 9:10 and to arrive 30 minutes prior to the apt.  I also explained I am waiting on her films and that if they dont get here in time I  left her apt for 5/7 and that we would see her then.  I explained that I would call her tomorrow morning if I didn't get her films.

## 2011-05-13 ENCOUNTER — Encounter (INDEPENDENT_AMBULATORY_CARE_PROVIDER_SITE_OTHER): Payer: Self-pay | Admitting: General Surgery

## 2011-05-13 ENCOUNTER — Ambulatory Visit (INDEPENDENT_AMBULATORY_CARE_PROVIDER_SITE_OTHER): Payer: Medicare Other | Admitting: General Surgery

## 2011-05-13 VITALS — BP 146/90 | HR 60 | Temp 97.0°F | Resp 18 | Ht 68.5 in | Wt 257.6 lb

## 2011-05-13 DIAGNOSIS — C50911 Malignant neoplasm of unspecified site of right female breast: Secondary | ICD-10-CM | POA: Insufficient documentation

## 2011-05-13 DIAGNOSIS — C50919 Malignant neoplasm of unspecified site of unspecified female breast: Secondary | ICD-10-CM

## 2011-05-13 NOTE — Patient Instructions (Signed)
Plan for u/s of lower ext to r/o clot MRI tues

## 2011-05-14 ENCOUNTER — Ambulatory Visit (HOSPITAL_COMMUNITY)
Admission: RE | Admit: 2011-05-14 | Discharge: 2011-05-14 | Disposition: A | Discharge status: Home / Self Care | Payer: Medicare Other | Source: Ambulatory Visit | Attending: General Surgery | Admitting: General Surgery

## 2011-05-14 DIAGNOSIS — M7989 Other specified soft tissue disorders: Secondary | ICD-10-CM | POA: Insufficient documentation

## 2011-05-14 DIAGNOSIS — C50919 Malignant neoplasm of unspecified site of unspecified female breast: Secondary | ICD-10-CM

## 2011-05-14 DIAGNOSIS — R0602 Shortness of breath: Secondary | ICD-10-CM

## 2011-05-14 NOTE — Progress Notes (Signed)
*  PRELIMINARY RESULTS* Vascular Ultrasound Bilateral lower extremity venous duplex has been completed.    Negative for deep vein thrombosis bilaterally.   Malachy Moan, RDMS, RDCS 05/14/2011, 3:01 PM

## 2011-05-18 ENCOUNTER — Ambulatory Visit
Admission: RE | Admit: 2011-05-18 | Discharge: 2011-05-18 | Disposition: A | Discharge status: Home / Self Care | Payer: Medicare Other | Source: Ambulatory Visit | Attending: Radiology | Admitting: Radiology

## 2011-05-18 ENCOUNTER — Other Ambulatory Visit: Payer: Medicare Other

## 2011-05-18 ENCOUNTER — Encounter (INDEPENDENT_AMBULATORY_CARE_PROVIDER_SITE_OTHER): Payer: Self-pay | Admitting: General Surgery

## 2011-05-18 ENCOUNTER — Encounter (INDEPENDENT_AMBULATORY_CARE_PROVIDER_SITE_OTHER): Payer: Medicare Other | Admitting: General Surgery

## 2011-05-18 ENCOUNTER — Ambulatory Visit (INDEPENDENT_AMBULATORY_CARE_PROVIDER_SITE_OTHER): Payer: Medicare Other | Admitting: Cardiology

## 2011-05-18 ENCOUNTER — Encounter: Payer: Self-pay | Admitting: Cardiology

## 2011-05-18 VITALS — BP 130/65 | HR 62 | Ht 69.0 in | Wt 259.8 lb

## 2011-05-18 DIAGNOSIS — I6529 Occlusion and stenosis of unspecified carotid artery: Secondary | ICD-10-CM

## 2011-05-18 DIAGNOSIS — E669 Obesity, unspecified: Secondary | ICD-10-CM

## 2011-05-18 DIAGNOSIS — C50919 Malignant neoplasm of unspecified site of unspecified female breast: Secondary | ICD-10-CM

## 2011-05-18 DIAGNOSIS — C50911 Malignant neoplasm of unspecified site of right female breast: Secondary | ICD-10-CM

## 2011-05-18 DIAGNOSIS — R609 Edema, unspecified: Secondary | ICD-10-CM

## 2011-05-18 MED ORDER — GADOBENATE DIMEGLUMINE 529 MG/ML IV SOLN
20.0000 mL | Freq: Once | INTRAVENOUS | Status: AC | PRN
  Administered 2011-05-18: 20 mL via INTRAVENOUS

## 2011-05-18 NOTE — Assessment & Plan Note (Signed)
We again discussed conservative therapies. At this time no changes in medications are indicate

## 2011-05-18 NOTE — Assessment & Plan Note (Signed)
She would be at acceptable risk from a cardiovascular standpoint for planned surgery.

## 2011-05-18 NOTE — Assessment & Plan Note (Signed)
She has lost 14 pounds with the change in diet and I encouraged more of the same.

## 2011-05-18 NOTE — Progress Notes (Signed)
HPI The patient presents for followup of hypertension and lower extremity swelling. She was just diagnosed with breast cancer and is due to have a lumpectomy and radiation. She thinks he caught it early. She did travel to [place] recently and when she came back she had significant swelling in her right greater than left leg. She has managed this conservatively and it seems to have gone down. She did have bilateral venous Dopplers which did not demonstrate clot and I reviewed these records. She denies any new cardiovascular symptoms. The patient denies any new symptoms such as chest discomfort, neck or arm discomfort. There has been no new shortness of breath, PND or orthopnea. There have been no reported palpitations, presyncope or syncope.  Allergies  Allergen Reactions  . Shellfish Allergy Anaphylaxis and Hives    Hives all over the body.  Marland Kitchen Percocet (Oxycodone-Acetaminophen) Hives    Current Outpatient Prescriptions  Medication Sig Dispense Refill  . Cholecalciferol (VITAMIN D PO) Take by mouth daily.        . Cyanocobalamin (B-12 PO) Take by mouth daily.        Marland Kitchen levothyroxine (SYNTHROID, LEVOTHROID) 137 MCG tablet Take 137 mcg by mouth daily.        . Nutritional Supplements (JUICE PLUS FIBRE) LIQD Take by mouth daily.      . Omega-3 350 MG CAPS Take 2 capsules by mouth daily.        Marland Kitchen triamterene-hydrochlorothiazide (DYAZIDE) 37.5-25 MG per capsule Take 1 capsule by mouth daily.         No current facility-administered medications for this visit.   Facility-Administered Medications Ordered in Other Visits  Medication Dose Route Frequency Provider Last Rate Last Dose  . gadobenate dimeglumine (MULTIHANCE) injection 20 mL  20 mL Intravenous Once PRN Medication Radiologist, MD   20 mL at 05/18/11 1054    Past Medical History  Diagnosis Date  . Chronic venous insufficiency   . PVD (peripheral vascular disease)     mild carotid 11/05  . Anxiety   . Osteoarthritis     bilat  hips, severe  . Obesity   . CONTACT DERMATITIS   . GLUCOSE INTOLERANCE   . VITAMIN D DEFICIENCY   . Edema   . Headache   . CAROTID STENOSIS   . DUPUYTREN'S CONTRACTURE   . MENOPAUSAL SYNDROME   . HLD (hyperlipidemia)   . Unspecified Myalgia and Myositis   . Hypothyroidism     Dr. Cherlyn Roberts in [place] [facility]   . Cancer     right breast   . Breast cancer     Past Surgical History  Procedure Date  . 2d echo 11/29/03    normal LV size. normal EF   . Normal caronary arteries     per pt. 2003  . Cholecystectomy   . Inguinal hernia repair   . Tonsillectomy   . Back surgery 1983  . Right rotator cuff surgery   . Total hip arthroplasty 05/2008 and 10/2008    B hip - alusio  . Hernia repair     incisional hernia  . Breast biopsy 05/11/11    right breast    ROS:  As stated in the HPI and negative for all other systems.  PHYSICAL EXAM BP 130/65  Pulse 62  Ht 5\' 9"  (1.753 m)  Wt 259 lb 12.8 oz (117.845 kg)  BMI 38.37 kg/m2 GENERAL:  Well appearing HEENT:  Pupils equal round and reactive, fundi not visualized, oral mucosa unremarkable NECK:  No jugular venous distention, waveform within normal limits, carotid upstroke brisk and symmetric, no bruits, no thyromegaly LYMPHATICS:  No cervical, inguinal adenopathy LUNGS:  Clear to auscultation bilaterally BACK:  No CVA tenderness CHEST:  Unremarkable HEART:  PMI not displaced or sustained,S1 and S2 within normal limits, no S3, no S4, no clicks, no rubs, no murmurs ABD:  Flat, positive bowel sounds normal in frequency in pitch, no bruits, no rebound, no guarding, no midline pulsatile mass, no hepatomegaly, no splenomegaly EXT:  2 plus pulses throughout, moderate edema, no cyanosis no clubbing, chronic venous stasis changes. SKIN:  No rashes no nodules NEURO:  Cranial nerves II through XII grossly intact, motor grossly intact throughout PSYCH:  Cognitively intact, oriented to person place and time  EKG:  Sinus rhythm, rate 62,  axis within normal limits, intervals within normal limits, no acute ST-T wave changes.   ASSESSMENT AND PLAN

## 2011-05-18 NOTE — Patient Instructions (Signed)
The current medical regimen is effective;  continue present plan and medications.  Follow up in 1 year with Dr Hochrein.  You will receive a letter in the mail 2 months before you are due.  Please call us when you receive this letter to schedule your follow up appointment.  

## 2011-05-18 NOTE — Assessment & Plan Note (Signed)
She had bilateral less than 39% stenosis and will have this followed again in 2 years.

## 2011-05-18 NOTE — Progress Notes (Signed)
Subjective:     Patient ID: Rachael [name], female   DOB: 12-16-1933, 78 y.o.   MRN: [id number]  HPI We are asked to see the patient in consultation by Dr. Isabell Jarvis to evaluate her for a right-sided breast cancer. The patient is a 78 year old white female who recently went for her routine screening mammogram. At that time an abnormality was noted in the upper inner right breast. This was biopsied and came back as an invasive breast cancer. Prior to her biopsy she denied any pain in the breast or any discharge from the nipple. She does not take any female hormones.  Review of Systems  Constitutional: Negative.   HENT: Negative.   Eyes: Negative.   Respiratory: Negative.   Cardiovascular: Negative.   Gastrointestinal: Negative.   Genitourinary: Negative.   Musculoskeletal: Positive for joint swelling.  Skin: Negative.   Neurological: Negative.   Hematological: Negative.   Psychiatric/Behavioral: Negative.        Objective:   Physical Exam  Constitutional: She is oriented to person, place, and time. She appears well-developed and well-nourished.  HENT:  Head: Normocephalic and atraumatic.  Eyes: Conjunctivae and EOM are normal. Pupils are equal, round, and reactive to light.  Neck: Normal range of motion. Neck supple.  Cardiovascular: Normal rate, regular rhythm and normal heart sounds.   Pulmonary/Chest: Effort normal and breath sounds normal.       The patient has some bruising in the inner aspect of the right breast. Otherwise no palpable mass in either breast. No palpable axillary supraclavicular or cervical lymphadenopathy.  Abdominal: Soft. Bowel sounds are normal. She exhibits no mass. There is no tenderness.  Musculoskeletal: Normal range of motion.       Significant venous stasis changes in the lower extremities right greater than left  Lymphadenopathy:    She has no cervical adenopathy.  Neurological: She is alert and oriented to person, place, and time.  Skin: Skin is warm  and dry.  Psychiatric: She has a normal mood and affect. Her behavior is normal.       Assessment:     The patient has what appears to be a small early stage invasive breast cancer in the inner right breast. I have discussed all the options with her and she is interested in breast conservation. I think she would be a good candidate for this. She will also be a good candidate for sentinel node mapping. I've discussed with her in detail the risks and benefits of the operation to do this as well as some of the technical aspects and she understands and wishes to proceed. She is scheduled for her MRI study this Tuesday and if this looks similar to the information that we are to have been I think this would be an appropriate plan for her. She also recently traveled and is having more swelling of her right lower extremity.    Plan:     At this point we will write for the MRI results. If this looks good then we'll plan for a right wire localized lumpectomy and sentinel node mapping. In the meantime we will also obtain an ultrasound duplex study of her lower extremities to rule out any clot

## 2011-05-19 ENCOUNTER — Telehealth (INDEPENDENT_AMBULATORY_CARE_PROVIDER_SITE_OTHER): Payer: Self-pay | Admitting: General Surgery

## 2011-05-19 NOTE — Telephone Encounter (Signed)
Called pt and told her that Dr Carolynne Edouard stated that she can be set up for surgery and transfer pt to the schedulers and order are in epic

## 2011-05-19 NOTE — Telephone Encounter (Signed)
Pt calling Dr. Carolynne Edouard to ask if he uses the IORT treatment for breast cancer, as she has just seen the end of a TV show about it.  After quick research on internet, pt instructed to call her oncologist to ask about this treatment.

## 2011-05-20 ENCOUNTER — Other Ambulatory Visit: Payer: Medicare Other

## 2011-05-26 ENCOUNTER — Encounter (INDEPENDENT_AMBULATORY_CARE_PROVIDER_SITE_OTHER): Payer: Self-pay

## 2011-06-01 ENCOUNTER — Encounter (HOSPITAL_COMMUNITY): Payer: Self-pay

## 2011-06-01 ENCOUNTER — Inpatient Hospital Stay (HOSPITAL_COMMUNITY): Admission: RE | Admit: 2011-06-01 | Discharge: 2011-06-01 | Payer: Medicare Other | Source: Ambulatory Visit

## 2011-06-03 ENCOUNTER — Encounter (HOSPITAL_COMMUNITY): Payer: Self-pay | Admitting: Pharmacy Technician

## 2011-06-09 ENCOUNTER — Telehealth: Payer: Self-pay | Admitting: Internal Medicine

## 2011-06-09 ENCOUNTER — Encounter: Payer: Self-pay | Admitting: Internal Medicine

## 2011-06-09 ENCOUNTER — Ambulatory Visit (INDEPENDENT_AMBULATORY_CARE_PROVIDER_SITE_OTHER): Payer: Medicare Other | Admitting: Internal Medicine

## 2011-06-09 VITALS — BP 112/58 | HR 68 | Temp 97.2°F | Ht 69.0 in | Wt 264.2 lb

## 2011-06-09 DIAGNOSIS — E039 Hypothyroidism, unspecified: Secondary | ICD-10-CM

## 2011-06-09 DIAGNOSIS — C50919 Malignant neoplasm of unspecified site of unspecified female breast: Secondary | ICD-10-CM

## 2011-06-09 DIAGNOSIS — I872 Venous insufficiency (chronic) (peripheral): Secondary | ICD-10-CM

## 2011-06-09 MED ORDER — FUROSEMIDE 40 MG PO TABS: 40.0000 mg | ORAL_TABLET | Freq: Every day | ORAL | Status: DC

## 2011-06-09 NOTE — Patient Instructions (Signed)
It was good to see you today. We have reviewed your prior records including labs and tests today Keep wound covered with polysporin and bandaid - then wear compression hose as discussed - keep feet elevated as much as able Use furosemide once daily x 3 days, 1/2 daily x 3 days then stop - call if you do not see weight reduction in 2-3 lbs or decreased swelling for further directions Please schedule followup in 4-6 months for review, call sooner if problems.

## 2011-06-09 NOTE — Assessment & Plan Note (Signed)
Wearing compression hose (fitted) since 04/2011 Small wound as noted above - Resume short term lasix and continue support hose

## 2011-06-09 NOTE — Telephone Encounter (Signed)
The pt called and is hoping to be worked in for a bleeding spot on her leg.  She states she has a scratch that keeps bleeding and is now leaking a clear fluid.  Do you want her worked in?  Thanks!   [phone]  Thanks!

## 2011-06-09 NOTE — Telephone Encounter (Signed)
Disregard note,  1pm apt canceled, pt put in slot.

## 2011-06-09 NOTE — Assessment & Plan Note (Signed)
Follows with endo in [place] for same The current medical regimen is effective;  continue present plan and medications.  

## 2011-06-09 NOTE — Assessment & Plan Note (Signed)
Planned surgery with Rachael [name] reviewed Support offered

## 2011-06-09 NOTE — Progress Notes (Signed)
Subjective:    Patient ID: Rachael [name], female    DOB: 05-Feb-1933, 78 y.o.   MRN: [id number]  HPI  Here for sore on RLE Onset 1 week ago, not improved with topical antibiotics  associated with mild bleeding No fever or purulent drainage Denies trauma, no pain  Also reviewed chronic medical issues:  hypothyroid - follows with endo in [place] for same - recent labs "ok" - no change skin bowels - reports compliance with ongoing medical treatment and no changes in medication dose or frequency.  denies adverse side effects related to current therapy. frustrated with inability to lose weight  dyslipidemia - reports compliance with ongoing medical treatment and no changes in medication dose or frequency. denies adverse side effects related to current therapy.   chronic venous insuff - uses diuretics daily - no hx HTN -   Past Medical History  Diagnosis Date  . Chronic venous insufficiency   . PVD (peripheral vascular disease)     mild carotid 11/05  . Osteoarthritis     bilat hips, severe  . Obesity   . CONTACT DERMATITIS   . GLUCOSE INTOLERANCE   . VITAMIN D DEFICIENCY   . Edema   . CAROTID STENOSIS   . DUPUYTREN'S CONTRACTURE   . MENOPAUSAL SYNDROME   . HLD (hyperlipidemia)   . Hypothyroidism     Dr. Cherlyn Roberts in [place] end[facility]   . Breast cancer     R  . Headache     patients reports r/t pinched nerve    Review of Systems  Constitutional: Positive for fatigue. Negative for fever.  Cardiovascular: Negative for chest pain.  Gastrointestinal: Negative for nausea, vomiting, diarrhea and constipation.  Genitourinary: Negative for dysuria.       Objective:   Physical Exam  BP 112/58  Pulse 68  Temp(Src) 97.2 F (36.2 C) (Oral)  Ht 5\' 9"  (1.753 m)  Wt 264 lb 3.2 oz (119.84 kg)  BMI 39.02 kg/m2  SpO2 93% Wt Readings from Last 3 Encounters:  06/09/11 264 lb 3.2 oz (119.84 kg)  05/18/11 259 lb 12.8 oz (117.845 kg)  05/13/11 257 lb 9.6 oz (116.847 kg)    Constitutional: She is obese, but appears well-developed and well-nourished. No distress.  Cardiovascular: Normal rate, regular rhythm and normal heart sounds.  No murmur heard. 1+ chronic BLE edema. Pulmonary/Chest: Effort normal and breath sounds normal. No respiratory distress. She has no wheezes.  Neurological: She is alert and oriented to person, place, and time. No cranial nerve deficit. Coordination normal.  Skin: tiny 2mm skin tear with clear weeping atop chronic venous insuff changes - no cellulitis or abscess. Skin is warm and dry. No rash noted. No erythema.  Psychiatric: She has a normal mood and affect. Her behavior is normal. Judgment and thought content normal.       Lab Results  Component Value Date   WBC 10.7* 08/07/2010   HGB 13.9 08/07/2010   HCT 41.0 08/07/2010   PLT 287 08/07/2010   CHOL 177 06/13/2009   TRIG 233.0* 06/13/2009   HDL 58.90 06/13/2009   LDLDIRECT 95.5 06/13/2009   ALT 24 06/13/2009   AST 30 06/13/2009   NA 139 08/07/2010   K 4.0 08/07/2010   CL 100 08/07/2010   CREATININE 1.11* 08/07/2010   BUN 28* 08/07/2010   CO2 27 08/07/2010   TSH 0.55 11/06/2009   INR 0.98 08/13/2010   HGBA1C 6.0 03/29/2008     Assessment & Plan:   Chronic venous insuff,  acute exac with increase in edema following travel Weeping with superficial wound from edema - supportive care and topical abx/bandaid as needed Resume short tern furosemide and continue support hose as rx'd by prescribed by hochrein

## 2011-06-10 ENCOUNTER — Telehealth (INDEPENDENT_AMBULATORY_CARE_PROVIDER_SITE_OTHER): Payer: Self-pay

## 2011-06-10 ENCOUNTER — Encounter (HOSPITAL_COMMUNITY)
Admission: RE | Admit: 2011-06-10 | Discharge: 2011-06-10 | Disposition: A | Discharge status: Home / Self Care | Payer: Medicare Other | Source: Ambulatory Visit | Attending: General Surgery | Admitting: General Surgery

## 2011-06-10 LAB — COMPREHENSIVE METABOLIC PANEL
ALT: 14 U/L (ref 0–35)
AST: 19 U/L (ref 0–37)
Albumin: 3.9 g/dL (ref 3.5–5.2)
Alkaline Phosphatase: 79 U/L (ref 39–117)
Chloride: 99 mEq/L (ref 96–112)
Potassium: 3.6 mEq/L (ref 3.5–5.1)
Total Bilirubin: 0.8 mg/dL (ref 0.3–1.2)

## 2011-06-10 LAB — CBC
HCT: 40.2 % (ref 36.0–46.0)
Platelets: 288 10*3/uL (ref 150–400)
RDW: 13.9 % (ref 11.5–15.5)
WBC: 9.2 10*3/uL (ref 4.0–10.5)

## 2011-06-10 NOTE — Telephone Encounter (Signed)
The patient called to inquire if she should stop the Juice plus preop?  It is just a fruit and grain supplement in a capsule.  I told her no.

## 2011-06-10 NOTE — Pre-Procedure Instructions (Signed)
20 Rachael [name]  06/10/2011   Your procedure is scheduled on:  Wednesday June 5  Report to [facility] at 8:00 AM after needle localization appointment at [facility] at 7:30  Call this number if you have problems the morning of surgery: [phone]   Remember:   Do not eat food:After Midnight.  May have clear liquids: up to 4 Hours before arrival.  Clear liquids include soda, tea, black coffee, apple or grape juice, broth.  Take these medicines the morning of surgery with A SIP OF WATER: synthroid   Do not wear jewelry, make-up or nail polish.  Do not wear lotions, powders, or perfumes. You may wear deodorant.  Do not shave 48 hours prior to surgery. Men may shave face and neck.  Do not bring valuables to the hospital.  Contacts, dentures or bridgework may not be worn into surgery.  Leave suitcase in the car. After surgery it may be brought to your room.  For patients admitted to the hospital, checkout time is 11:00 AM the day of discharge.   Patients discharged the day of surgery will not be allowed to drive home.  Name and phone number of your driver: family  Special Instructions: CHG Shower Use Special Wash: 1/2 bottle night before surgery and 1/2 bottle morning of surgery.   Please read over the following fact sheets that you were given: Pain Booklet, Coughing and Deep Breathing and Surgical Site Infection Prevention

## 2011-06-14 ENCOUNTER — Telehealth: Payer: Self-pay | Admitting: Internal Medicine

## 2011-06-14 MED ORDER — FUROSEMIDE 40 MG PO TABS: ORAL_TABLET | ORAL | Status: DC

## 2011-06-14 NOTE — Telephone Encounter (Signed)
Caller: Fallan/Patient; PCP: Rene Paci; CB#: [phone]; ; ; Call regarding Edema;  Rachael [name] was in the office on 06/09/11 d/t bilateral ankle edema.  Placed on 40mg  Lasix to be reduced to 20mg  Lasix on 06/12/11.  Swelling went away and she lost 7 lbs when starting Lasix.  After reducing dose to 20 mg, edema returned and she gained 6 lbs.  Denies pain.  Scheduled for surgery d/t breast cancer on 06/16/11.  Utilized Edema, Atraumatic Guideline.  See PCP within 24 hrs disposition.  Pt requesting note be sent to Dr. Felicity Coyer  instead of making appt at this time.  Please f/u with [name].

## 2011-06-14 NOTE — Telephone Encounter (Signed)
MD is out of office.Pls advise... 06/14/11@11 :39am/LMB

## 2011-06-14 NOTE — Telephone Encounter (Signed)
Notified pt with md response. She states will make appt with her surgeon since she is having surgery on Wed instead of making f/u appt...06/14/11@1 :30pm/LMB

## 2011-06-14 NOTE — Telephone Encounter (Signed)
Lasix rx adjusted as above  F/u as prior recommended per Dr Felicity Coyer

## 2011-06-15 MED ORDER — CHLORHEXIDINE GLUCONATE 4 % EX LIQD: 1.0000 "application " | Freq: Once | CUTANEOUS | Status: DC

## 2011-06-15 MED ORDER — CEFAZOLIN SODIUM-DEXTROSE 2-3 GM-% IV SOLR
2.0000 g | INTRAVENOUS | Status: AC
  Administered 2011-06-16: 2 g via INTRAVENOUS
  Filled 2011-06-15: qty 50

## 2011-06-16 ENCOUNTER — Ambulatory Visit (HOSPITAL_COMMUNITY)
Admission: RE | Admit: 2011-06-16 | Discharge: 2011-06-16 | Disposition: A | Discharge status: Home / Self Care | Payer: Medicare Other | Source: Ambulatory Visit | Attending: General Surgery | Admitting: General Surgery

## 2011-06-16 ENCOUNTER — Encounter (HOSPITAL_COMMUNITY)
Admission: RE | Disposition: A | Discharge status: Home / Self Care | Payer: Self-pay | Source: Ambulatory Visit | Attending: General Surgery

## 2011-06-16 ENCOUNTER — Encounter (HOSPITAL_COMMUNITY): Payer: Self-pay | Admitting: Anesthesiology

## 2011-06-16 ENCOUNTER — Encounter (HOSPITAL_COMMUNITY): Payer: Self-pay

## 2011-06-16 ENCOUNTER — Ambulatory Visit (HOSPITAL_COMMUNITY): Payer: Medicare Other | Admitting: Anesthesiology

## 2011-06-16 DIAGNOSIS — C50919 Malignant neoplasm of unspecified site of unspecified female breast: Secondary | ICD-10-CM

## 2011-06-16 DIAGNOSIS — C50219 Malignant neoplasm of upper-inner quadrant of unspecified female breast: Secondary | ICD-10-CM | POA: Insufficient documentation

## 2011-06-16 DIAGNOSIS — Z01812 Encounter for preprocedural laboratory examination: Secondary | ICD-10-CM | POA: Insufficient documentation

## 2011-06-16 SURGERY — BREAST LUMPECTOMY WITH NEEDLE LOCALIZATION AND AXILLARY SENTINEL LYMPH NODE BX
Anesthesia: General | Site: Breast | Laterality: Right | Wound class: Clean

## 2011-06-16 MED ORDER — LACTATED RINGERS IV SOLN
INTRAVENOUS | Status: DC | PRN
  Administered 2011-06-16: 11:00:00 via INTRAVENOUS

## 2011-06-16 MED ORDER — HYDROCODONE-ACETAMINOPHEN 5-325 MG PO TABS: 1.0000 | ORAL_TABLET | Freq: Four times a day (QID) | ORAL | Status: AC | PRN

## 2011-06-16 MED ORDER — SODIUM CHLORIDE 0.9 % IJ SOLN
INTRAMUSCULAR | Status: DC | PRN
  Administered 2011-06-16: 11:00:00 via INTRAMUSCULAR

## 2011-06-16 MED ORDER — FENTANYL CITRATE 0.05 MG/ML IJ SOLN
INTRAMUSCULAR | Status: DC | PRN
  Administered 2011-06-16: 100 ug via INTRAVENOUS

## 2011-06-16 MED ORDER — FENTANYL CITRATE 0.05 MG/ML IJ SOLN
50.0000 ug | INTRAMUSCULAR | Status: DC | PRN
  Administered 2011-06-16: 100 ug via INTRAVENOUS

## 2011-06-16 MED ORDER — PROPOFOL 10 MG/ML IV EMUL
INTRAVENOUS | Status: DC | PRN
  Administered 2011-06-16: 200 mg via INTRAVENOUS

## 2011-06-16 MED ORDER — SODIUM CHLORIDE 0.9 % IR SOLN
Status: DC | PRN
  Administered 2011-06-16: 1000 mL

## 2011-06-16 MED ORDER — BUPIVACAINE-EPINEPHRINE 0.25% -1:200000 IJ SOLN
INTRAMUSCULAR | Status: DC | PRN
  Administered 2011-06-16: 20 mL

## 2011-06-16 MED ORDER — HYDROMORPHONE HCL PF 1 MG/ML IJ SOLN
0.2500 mg | INTRAMUSCULAR | Status: DC | PRN
  Administered 2011-06-16 (×2): 0.5 mg via INTRAVENOUS

## 2011-06-16 MED ORDER — ONDANSETRON HCL 4 MG/2ML IJ SOLN: 4.0000 mg | Freq: Once | INTRAMUSCULAR | Status: DC | PRN

## 2011-06-16 MED ORDER — FENTANYL CITRATE 0.05 MG/ML IJ SOLN
INTRAMUSCULAR | Status: AC
  Filled 2011-06-16: qty 2

## 2011-06-16 MED ORDER — ONDANSETRON HCL 4 MG/2ML IJ SOLN
INTRAMUSCULAR | Status: DC | PRN
  Administered 2011-06-16: 4 mg via INTRAVENOUS

## 2011-06-16 MED ORDER — LIDOCAINE HCL (CARDIAC) 20 MG/ML IV SOLN
INTRAVENOUS | Status: DC | PRN
  Administered 2011-06-16: 50 mg via INTRAVENOUS

## 2011-06-16 MED ORDER — TECHNETIUM TC 99M SULFUR COLLOID FILTERED: 1.0000 | Freq: Once | INTRAVENOUS | Status: AC | PRN

## 2011-06-16 MED ORDER — LACTATED RINGERS IV SOLN
INTRAVENOUS | Status: DC
  Administered 2011-06-16: 10:00:00 via INTRAVENOUS

## 2011-06-16 SURGICAL SUPPLY — 51 items
ADH SKN CLS APL DERMABOND .7 (GAUZE/BANDAGES/DRESSINGS) ×1
APPLIER CLIP 9.375 MED OPEN (MISCELLANEOUS) ×2
APR CLP MED 9.3 20 MLT OPN (MISCELLANEOUS) ×1
BINDER BREAST LRG (GAUZE/BANDAGES/DRESSINGS) IMPLANT
BINDER BREAST XLRG (GAUZE/BANDAGES/DRESSINGS) IMPLANT
BLADE SURG 10 STRL SS (BLADE) ×2 IMPLANT
BLADE SURG 15 STRL LF DISP TIS (BLADE) ×1 IMPLANT
BLADE SURG 15 STRL SS (BLADE) ×2
CANISTER SUCTION 2500CC (MISCELLANEOUS) ×2 IMPLANT
CHLORAPREP W/TINT 26ML (MISCELLANEOUS) ×2 IMPLANT
CLIP APPLIE 9.375 MED OPEN (MISCELLANEOUS) IMPLANT
CLOTH BEACON ORANGE TIMEOUT ST (SAFETY) ×2 IMPLANT
CONT SPEC 4OZ CLIKSEAL STRL BL (MISCELLANEOUS) ×4 IMPLANT
COVER PROBE W GEL 5X96 (DRAPES) ×2 IMPLANT
COVER SURGICAL LIGHT HANDLE (MISCELLANEOUS) ×2 IMPLANT
DERMABOND ADVANCED (GAUZE/BANDAGES/DRESSINGS) ×1
DERMABOND ADVANCED .7 DNX12 (GAUZE/BANDAGES/DRESSINGS) ×1 IMPLANT
DEVICE DUBIN SPECIMEN MAMMOGRA (MISCELLANEOUS) ×2 IMPLANT
DRAPE CHEST BREAST 15X10 FENES (DRAPES) ×2 IMPLANT
ELECT CAUTERY BLADE 6.4 (BLADE) ×2 IMPLANT
ELECT REM PT RETURN 9FT ADLT (ELECTROSURGICAL) ×2
ELECTRODE REM PT RTRN 9FT ADLT (ELECTROSURGICAL) ×1 IMPLANT
GAUZE SPONGE 4X4 16PLY XRAY LF (GAUZE/BANDAGES/DRESSINGS) ×1 IMPLANT
GLOVE BIO SURGEON STRL SZ7.5 (GLOVE) ×2 IMPLANT
GLOVE BIOGEL PI IND STRL 6.5 (GLOVE) IMPLANT
GLOVE BIOGEL PI INDICATOR 6.5 (GLOVE) ×1
GLOVE SURG SS PI 6.5 STRL IVOR (GLOVE) ×1 IMPLANT
GOWN STRL NON-REIN LRG LVL3 (GOWN DISPOSABLE) ×4 IMPLANT
KIT BASIN OR (CUSTOM PROCEDURE TRAY) ×2 IMPLANT
KIT MARKER MARGIN INK (KITS) ×1 IMPLANT
KIT ROOM TURNOVER OR (KITS) ×2 IMPLANT
NDL 18GX1X1/2 (RX/OR ONLY) (NEEDLE) ×1 IMPLANT
NDL HYPO 25GX1X1/2 BEV (NEEDLE) ×2 IMPLANT
NEEDLE 18GX1X1/2 (RX/OR ONLY) (NEEDLE) ×2 IMPLANT
NEEDLE HYPO 25GX1X1/2 BEV (NEEDLE) ×4 IMPLANT
NS IRRIG 1000ML POUR BTL (IV SOLUTION) ×2 IMPLANT
PACK SURGICAL SETUP 50X90 (CUSTOM PROCEDURE TRAY) ×2 IMPLANT
PAD ARMBOARD 7.5X6 YLW CONV (MISCELLANEOUS) ×2 IMPLANT
PENCIL BUTTON HOLSTER BLD 10FT (ELECTRODE) ×2 IMPLANT
SPONGE LAP 18X18 X RAY DECT (DISPOSABLE) ×2 IMPLANT
SUT MNCRL AB 4-0 PS2 18 (SUTURE) ×2 IMPLANT
SUT SILK 2 0 SH (SUTURE) ×1 IMPLANT
SUT VIC AB 3-0 SH 18 (SUTURE) ×1 IMPLANT
SUT VIC AB 3-0 SH 27 (SUTURE) ×2
SUT VIC AB 3-0 SH 27XBRD (SUTURE) ×1 IMPLANT
SYR BULB 3OZ (MISCELLANEOUS) ×2 IMPLANT
SYR CONTROL 10ML LL (SYRINGE) ×4 IMPLANT
TOWEL OR 17X24 6PK STRL BLUE (TOWEL DISPOSABLE) ×2 IMPLANT
TOWEL OR 17X26 10 PK STRL BLUE (TOWEL DISPOSABLE) ×2 IMPLANT
TUBE CONNECTING 12X1/4 (SUCTIONS) ×2 IMPLANT
YANKAUER SUCT BULB TIP NO VENT (SUCTIONS) ×2 IMPLANT

## 2011-06-16 NOTE — Op Note (Signed)
06/16/2011  11:56 AM  PATIENT:  Rachael [name]  78 y.o. female  PRE-OPERATIVE DIAGNOSIS:  Right breast cancer  POST-OPERATIVE DIAGNOSIS:  Right Breast Cancer  PROCEDURE:  Procedure(s) (LRB): BREAST LUMPECTOMY WITH NEEDLE LOCALIZATION AND AXILLARY SENTINEL LYMPH NODE BX (Right)  SURGEON:  Surgeon(s) and Role:    * Robyne Askew, MD - Primary  PHYSICIAN ASSISTANT:   ASSISTANTS: none   ANESTHESIA:   general  EBL:  Total I/O In: 700 [I.V.:700] Out: -   BLOOD ADMINISTERED:none  DRAINS: none   LOCAL MEDICATIONS USED:  MARCAINE     SPECIMEN:  Source of Specimen:  right breast tissue and sentinel lymph node X2  DISPOSITION OF SPECIMEN:  PATHOLOGY  COUNTS:  YES  TOURNIQUET:  * No tourniquets in log *  DICTATION: .Dragon Dictation After formal consent was obtained the patient was brought to the operating room and placed in the supine position on the operating room table. After adequate induction of general anesthesia the patient's right breast, chest, and axilla were prepped with ChloraPrep, allowed to dry, and draped in usual sterile manner. Earlier in the day the patient underwent a wire localization procedure and the wire was entering the right breast at the inner portion and headed superiorly. Also earlier in the day the patient underwent injection of 1 mCi of technetium sulfur colloid in the subareolar position. At this point, 2 cc of methylene blue and 3 cc of injectable saline were injected in the subareolar position the breast was massaged for several minutes. A neoprobe was used to identify a hot spot in the right axilla. A small transversely oriented incision was made with a 15 blade knife overlying the hot spot in the right axilla. This incision was carried through the skin and subcutaneous tissue sharply with electrocautery until the axilla was entered. A Wheatlaner retractor was deployed. Using the neoprobe to direct the dissection 1 hot blue lymph nodes was identified.  This lymph node was excised by combination of sharp and Bovie dissection and then the lymphatics were clamped with hemostats divided and ligated with 3-0 Vicryl ties. Ex vivo counts on this lymph node were around 2000. A second area of radioactivity was identified. This was excised sharply with the electrocautery. The ex vivo counts on this tissue were around 200. These were sent as sentinel lymph nodes #1 and 2. Hemostasis was achieved using the Bovie electrocautery. The wound was infiltrated with quarter percent Marcaine. The deep layer the wound was closed with interrupted 0 Vicryl stitches. The skin was then closed around a 4 Monocryl subcuticular stitch. Attention was then turned to the right breast. An elliptical incision was made in the skin overlying the path of the wire. This incision was carried through the skin and subcutaneous tissue sharply with the electrocautery. Next a circular portion of breast tissue was excised sharply around the path of the wire. This was carried all the way to the chest wall. Once the specimen was removed it was oriented according to the assigned paint colors. A specimen radiograph was obtained. It appears as though we may have been a little bit close on the medial side. An additional medial margin was taken sharply with the electrocautery and the stitch marked the true surgical margin. The specimens were then sent to pathology. Hemostasis was achieved using the Bovie electrocautery. The wound was infiltrated with quarter Marcaine. The wound was irrigated with copious amounts of saline. The deep layer the wound was then closed with interrupted 3-0 Vicryl  stitches. The skin was then closed with interrupted 4-0 Monocryl subcuticular stitches. Dermabond dressings were applied. The patient tolerated the procedure well. At the end of the case needle sponge and instrument counts were correct. The patient was awakened and taken to recovery in stable condition.  PLAN OF CARE:  Discharge to home after PACU  PATIENT DISPOSITION:  PACU - hemodynamically stable.   Delay start of Pharmacological VTE agent (>24hrs) due to surgical blood loss or risk of bleeding: not applicable

## 2011-06-16 NOTE — Preoperative (Signed)
Beta Blockers   Reason not to administer Beta Blockers:Not Applicable 

## 2011-06-16 NOTE — Progress Notes (Signed)
pt has has had problems with legs bursting open due to swelling. Is currently taking lasix prescribed by MD for this.  Pt also stated she acquired a rash last time she was in hosppital from linen.  Special linen cart ordered from laundry and is accompanying Pt.

## 2011-06-16 NOTE — Anesthesia Procedure Notes (Signed)
Procedure Name: LMA Insertion Date/Time: 06/16/2011 10:39 AM Performed by: Gwenyth Allegra Pre-anesthesia Checklist: Patient identified, Timeout performed, Emergency Drugs available, Suction available and Patient being monitored Patient Re-evaluated:Patient Re-evaluated prior to inductionOxygen Delivery Method: Circle system utilized Intubation Type: IV induction LMA: LMA inserted LMA Size: 4.0 Number of attempts: 1 Placement Confirmation: positive ETCO2 and breath sounds checked- equal and bilateral Tube secured with: Tape Dental Injury: Teeth and Oropharynx as per pre-operative assessment

## 2011-06-16 NOTE — H&P (View-Only) (Signed)
Subjective:     Patient ID: Rachael [name], female   DOB: 07/22/1933, 77 y.o.   MRN: [id number]  HPI We are asked to see the patient in consultation by Dr. Bertran to evaluate her for a right-sided breast cancer. The patient is a 77-year-old white female who recently went for her routine screening mammogram. At that time an abnormality was noted in the upper inner right breast. This was biopsied and came back as an invasive breast cancer. Prior to her biopsy she denied any pain in the breast or any discharge from the nipple. She does not take any female hormones.  Review of Systems  Constitutional: Negative.   HENT: Negative.   Eyes: Negative.   Respiratory: Negative.   Cardiovascular: Negative.   Gastrointestinal: Negative.   Genitourinary: Negative.   Musculoskeletal: Positive for joint swelling.  Skin: Negative.   Neurological: Negative.   Hematological: Negative.   Psychiatric/Behavioral: Negative.        Objective:   Physical Exam  Constitutional: She is oriented to person, place, and time. She appears well-developed and well-nourished.  HENT:  Head: Normocephalic and atraumatic.  Eyes: Conjunctivae and EOM are normal. Pupils are equal, round, and reactive to light.  Neck: Normal range of motion. Neck supple.  Cardiovascular: Normal rate, regular rhythm and normal heart sounds.   Pulmonary/Chest: Effort normal and breath sounds normal.       The patient has some bruising in the inner aspect of the right breast. Otherwise no palpable mass in either breast. No palpable axillary supraclavicular or cervical lymphadenopathy.  Abdominal: Soft. Bowel sounds are normal. She exhibits no mass. There is no tenderness.  Musculoskeletal: Normal range of motion.       Significant venous stasis changes in the lower extremities right greater than left  Lymphadenopathy:    She has no cervical adenopathy.  Neurological: She is alert and oriented to person, place, and time.  Skin: Skin is warm  and dry.  Psychiatric: She has a normal mood and affect. Her behavior is normal.       Assessment:     The patient has what appears to be a small early stage invasive breast cancer in the inner right breast. I have discussed all the options with her and she is interested in breast conservation. I think she would be a good candidate for this. She will also be a good candidate for sentinel node mapping. I've discussed with her in detail the risks and benefits of the operation to do this as well as some of the technical aspects and she understands and wishes to proceed. She is scheduled for her MRI study this Tuesday and if this looks similar to the information that we are to have been I think this would be an appropriate plan for her. She also recently traveled and is having more swelling of her right lower extremity.    Plan:     At this point we will write for the MRI results. If this looks good then we'll plan for a right wire localized lumpectomy and sentinel node mapping. In the meantime we will also obtain an ultrasound duplex study of her lower extremities to rule out any clot      

## 2011-06-16 NOTE — Interval H&P Note (Signed)
History and Physical Interval Note:  06/16/2011 9:21 AM  Rachael [name]  has presented today for surgery, with the diagnosis of Right breast cancer  The various methods of treatment have been discussed with the patient and family. After consideration of risks, benefits and other options for treatment, the patient has consented to  Procedure(s) (LRB): BREAST LUMPECTOMY WITH NEEDLE LOCALIZATION AND AXILLARY SENTINEL LYMPH NODE BX (Right) as a surgical intervention .  The patients' history has been reviewed, patient examined, no change in status, stable for surgery.  I have reviewed the patients' chart and labs.  Questions were answered to the patient's satisfaction.     TOTH III,Fremon Zacharia S

## 2011-06-16 NOTE — OR Nursing (Signed)
1118- Labeled specimen descriptions confirmed with surgeon and sent to pathology, fresh in saline.

## 2011-06-16 NOTE — Anesthesia Preprocedure Evaluation (Signed)
Anesthesia Evaluation  Patient identified by MRN, date of birth, ID band Patient awake    Reviewed: Allergy & Precautions, H&P , NPO status , Patient's Chart, lab work & pertinent test results  History of Anesthesia Complications (+) PROLONGED EMERGENCE  Airway Mallampati: I TM Distance: >3 FB Neck ROM: full    Dental   Pulmonary          Cardiovascular Exercise Tolerance: Good + Peripheral Vascular Disease Rhythm:regular Rate:Normal     Neuro/Psych  Headaches,    GI/Hepatic   Endo/Other    Renal/GU      Musculoskeletal   Abdominal   Peds  Hematology   Anesthesia Other Findings   Reproductive/Obstetrics                           Anesthesia Physical Anesthesia Plan  ASA: II  Anesthesia Plan: General   Post-op Pain Management:    Induction: Intravenous  Airway Management Planned: LMA and Oral ETT  Additional Equipment:   Intra-op Plan:   Post-operative Plan: Extubation in OR  Informed Consent: I have reviewed the patients History and Physical, chart, labs and discussed the procedure including the risks, benefits and alternatives for the proposed anesthesia with the patient or authorized representative who has indicated his/her understanding and acceptance.     Plan Discussed with: CRNA, Anesthesiologist and Surgeon  Anesthesia Plan Comments:         Anesthesia Quick Evaluation

## 2011-06-16 NOTE — Anesthesia Postprocedure Evaluation (Signed)
  Anesthesia Post-op Note  Patient: Rachael [name]  Procedure(s) Performed: Procedure(s) (LRB): BREAST LUMPECTOMY WITH NEEDLE LOCALIZATION AND AXILLARY SENTINEL LYMPH NODE BX (Right)  Patient Location: PACU  Anesthesia Type: General  Level of Consciousness: awake, oriented, sedated and patient cooperative  Airway and Oxygen Therapy: Patient Spontanous Breathing and Patient connected to nasal cannula oxygen  Post-op Pain: mild  Post-op Assessment: Post-op Vital signs reviewed, Patient's Cardiovascular Status Stable, Respiratory Function Stable, Patent Airway, No signs of Nausea or vomiting and Pain level controlled  Post-op Vital Signs: stable  Complications: No apparent anesthesia complications

## 2011-06-16 NOTE — Transfer of Care (Signed)
Immediate Anesthesia Transfer of Care Note  Patient: Rachael [name]  Procedure(s) Performed: Procedure(s) (LRB): BREAST LUMPECTOMY WITH NEEDLE LOCALIZATION AND AXILLARY SENTINEL LYMPH NODE BX (Right)  Patient Location: PACU  Anesthesia Type: General  Level of Consciousness: awake and alert   Airway & Oxygen Therapy: Patient Spontanous Breathing and Patient connected to nasal cannula oxygen  Post-op Assessment: Report given to PACU RN and Post -op Vital signs reviewed and stable  Post vital signs: Reviewed and stable  Complications: No apparent anesthesia complications

## 2011-06-17 ENCOUNTER — Telehealth (INDEPENDENT_AMBULATORY_CARE_PROVIDER_SITE_OTHER): Payer: Self-pay | Admitting: General Surgery

## 2011-06-17 NOTE — Telephone Encounter (Signed)
Instructed daughter OK to take Motrin or Aleve for headache and push po fluids AMAP.  She understands and will comply.

## 2011-06-17 NOTE — Telephone Encounter (Signed)
LMOM for pt letting her know that her first PO apt w/ Dr. Carolynne Edouard will be on 6/21 at 3:40pm.  I also mailed her a reminder card.

## 2011-06-17 NOTE — Telephone Encounter (Signed)
Daughter calling to report pt has headache today and wants to clear meds for it.  Pt is not taking Rx pain meds (says she is not having any post op pain, only headache.

## 2011-06-18 ENCOUNTER — Telehealth (INDEPENDENT_AMBULATORY_CARE_PROVIDER_SITE_OTHER): Payer: Self-pay | Admitting: General Surgery

## 2011-06-18 NOTE — Telephone Encounter (Signed)
Pt calling to report persistent Lt arm pain.  The day of surgery, as anesthesia began infusing through the IV in her Lt arm, she experienced severe pain in the arm and impressive blood pressure elevation.  The staff explained the anesthesia was running in too fast.  However, since then her arm has been very painful. She tried to call Short Stay to report it to anesthesia but was told to call her "regular" physician.

## 2011-06-18 NOTE — Progress Notes (Signed)
Patient called [facility] to ask for Short Stay RN about left  arm pain post op from 06/16/11 surgery with Dr. Carolynne Edouard Right lumpectomy. Patient states that anesthesia was being given (patient states IV was in left hand) and BP cuff going at same time and "arm felt like it was going to explode". States Dr. Katrinka Blazing was anesthesiologist. States since 06/16/11 left arm from the elbow up is aching and bis a 9 on a 1-10 pain scale. Patient states she called Dr. Carolynne Edouard office and they told her she should call [facility].   Spoke with Ferne Reus AD about this and she referred me to call Risk management. Spoke with Jerilynn Som in [employer] who took patient's information and phone # and she is going to call patient. Elnita Maxwell told me to put note in [employer] and do [facility] portal.

## 2011-06-20 ENCOUNTER — Emergency Department (HOSPITAL_COMMUNITY): Payer: Medicare Other

## 2011-06-20 ENCOUNTER — Emergency Department (HOSPITAL_COMMUNITY)
Admission: EM | Admit: 2011-06-20 | Discharge: 2011-06-20 | Disposition: A | Discharge status: Home / Self Care | Payer: Medicare Other | Attending: Emergency Medicine | Admitting: Emergency Medicine

## 2011-06-20 ENCOUNTER — Telehealth (INDEPENDENT_AMBULATORY_CARE_PROVIDER_SITE_OTHER): Payer: Self-pay | Admitting: Surgery

## 2011-06-20 ENCOUNTER — Encounter (HOSPITAL_COMMUNITY): Payer: Self-pay | Admitting: *Deleted

## 2011-06-20 DIAGNOSIS — C50919 Malignant neoplasm of unspecified site of unspecified female breast: Secondary | ICD-10-CM | POA: Insufficient documentation

## 2011-06-20 DIAGNOSIS — M47812 Spondylosis without myelopathy or radiculopathy, cervical region: Secondary | ICD-10-CM

## 2011-06-20 DIAGNOSIS — M79609 Pain in unspecified limb: Secondary | ICD-10-CM | POA: Insufficient documentation

## 2011-06-20 DIAGNOSIS — M542 Cervicalgia: Secondary | ICD-10-CM | POA: Insufficient documentation

## 2011-06-20 DIAGNOSIS — R51 Headache: Secondary | ICD-10-CM | POA: Insufficient documentation

## 2011-06-20 MED ORDER — HYDROCODONE-ACETAMINOPHEN 5-325 MG PO TABS
1.0000 | ORAL_TABLET | Freq: Once | ORAL | Status: AC
  Administered 2011-06-20: 1 via ORAL
  Filled 2011-06-20: qty 1

## 2011-06-20 MED ORDER — OXYCODONE-ACETAMINOPHEN 5-325 MG PO TABS: 1.0000 | ORAL_TABLET | Freq: Once | ORAL | Status: DC

## 2011-06-20 NOTE — ED Provider Notes (Signed)
History     CSN: [id number]  Arrival date & time 06/20/11  1341   First MD Initiated Contact with Patient 06/20/11 1531      Chief Complaint  Patient presents with  . Extremity Pain    (Consider location/radiation/quality/duration/timing/severity/associated sxs/prior treatment) HPI Comments: Patient is a 78 year old woman who underwent a right lumpectomy for breast cancer several days ago. She says that when she had the surgery, she received her medication and her left arm, and felt as though her left arm was going to explode. She has had the surgery successfully. As she has pain in her left arm, and it has gone into her neck across her shoulders, up into her occipital region, and into her right arm down into the area of the biceps muscle. Side for her to raise her arms. She therefore sought evaluation. She had spoken to the anesthesiologist about her symptoms, which initially purchased in her left arm, and was advised that this would probably resolve with time.  Patient is a 78 y.o. female presenting with extremity pain. The history is provided by the patient and medical records. No language interpreter was used.  Extremity Pain This is a new problem. The current episode started more than 2 days ago. The problem occurs constantly. The problem has been gradually worsening. Associated symptoms include headaches. Exacerbated by: Moving her arms or neck. The symptoms are relieved by nothing. She has tried nothing for the symptoms.    Past Medical History  Diagnosis Date  . Chronic venous insufficiency   . PVD (peripheral vascular disease)     mild carotid 11/05  . Osteoarthritis     bilat hips, severe  . Obesity   . CONTACT DERMATITIS   . GLUCOSE INTOLERANCE   . VITAMIN D DEFICIENCY   . Edema     pt  states had edema of feet and legs has been on lasix per dr. to help  . CAROTID STENOSIS   . DUPUYTREN'S CONTRACTURE   . MENOPAUSAL SYNDROME   . HLD (hyperlipidemia)   . Hypothyroidism      Dr. Cherlyn Roberts in [place] end[facility]   . Breast cancer     R  . Headache     patients reports r/t pinched nerve  . Complication of anesthesia     Past Surgical History  Procedure Date  . 2d echo 11/29/03    normal LV size. normal EF   . Normal caronary arteries     per pt. 2003  . Cholecystectomy   . Inguinal hernia repair   . Tonsillectomy   . Back surgery 1983  . Right rotator cuff surgery   . Total hip arthroplasty 05/2008 and 10/2008    B hip - alusio  . Hernia repair     incisional hernia  . Breast biopsy 05/11/11    right breast    Family History  Problem Relation Age of Onset  . Coronary artery disease      family hx - female 1st degree relative <60  . Hypothyroidism      aunt   . Cancer Mother     uterine  . Cancer Daughter     breast  . Cancer Maternal Grandfather     pancreatic  . Anesthesia problems Neg Hx     History  Substance Use Topics  . Smoking status: Never Smoker   . Smokeless tobacco: Never Used   Comment: Divorced, lives with 91 yo mother  . Alcohol Use: No    OB  History    Grav Para Term Preterm Abortions TAB SAB Ect Mult Living                  Review of Systems  HENT: Positive for neck pain.   Eyes: Negative.   Respiratory: Negative.   Cardiovascular: Negative.   Gastrointestinal: Negative.   Genitourinary: Negative.   Musculoskeletal:       Pain in both arms  Skin: Negative.   Neurological: Positive for weakness and headaches.  Psychiatric/Behavioral: Negative.     Allergies  Shellfish allergy; Percocet; and Iodine  Home Medications   Current Outpatient Rx  Name Route Sig Dispense Refill  . VITAMIN D 1000 UNITS PO TABS Oral Take 2,000 Units by mouth daily.    . B-12 PO Oral Take 1 tablet by mouth daily.     Marland Kitchen LEVOTHYROXINE SODIUM 137 MCG PO TABS Oral Take 137 mcg by mouth daily.     Marland Kitchen JUICE PLUS FIBRE PO LIQD Oral Take 4 capsules by mouth 3 (three) times daily. 4 vegetable capsules with breakfast, 4 fruit capsules  with lunch and 4 berry capsules with dinner.    . OMEGA-3 350 MG PO CAPS Oral Take 2 capsules by mouth daily.      . TRIAMTERENE-HCTZ 37.5-25 MG PO CAPS Oral Take 1 capsule by mouth daily.      Marland Kitchen HYDROCODONE-ACETAMINOPHEN 5-325 MG PO TABS Oral Take 1 tablet by mouth every 6 (six) hours as needed for pain. 30 tablet 2    BP 128/49  Pulse 69  Temp(Src) 99.2 F (37.3 C) (Oral)  Resp 19  SpO2 93%  Physical Exam  Nursing note and vitals reviewed. Constitutional: She is oriented to person, place, and time. She appears well-developed and well-nourished. Distressed: in mild distress, complaining of pain in the back of her neck and head and into her arms.  HENT:  Head: Normocephalic and atraumatic.  Right Ear: External ear normal.  Left Ear: External ear normal.  Mouth/Throat: Oropharynx is clear and moist.  Eyes: Conjunctivae and EOM are normal. Pupils are equal, round, and reactive to light.  Neck:       Is no palpable bony deformity of her neck, but she has limitation of range of motion due to pain.  Cardiovascular: Normal rate, regular rhythm and normal heart sounds.   Pulmonary/Chest: Effort normal and breath sounds normal.  Abdominal: Soft. Bowel sounds are normal.  Musculoskeletal: Normal range of motion.       She has chronic venous stasis changes in both ankles, worse on the right.  Neurological: She is alert and oriented to person, place, and time.       No sensory or motor deficit. She has pain on trying to elevate her upper arms.  Skin: Skin is warm and dry.  Psychiatric: She has a normal mood and affect. Her behavior is normal.    ED Course  Procedures (including critical care time)  4:05 PM Pt seen --> physical exam performed.  EKG was normal.  MR of head and neck ordered.   Date: 06/20/2011  Rate: 61  Rhythm: normal sinus rhythm and sinus arrhythmia  QRS Axis: normal  Intervals: normal  ST/T Wave abnormalities: normal  Conduction Disutrbances:none  Narrative  Interpretation: Normal EKG.  Old EKG Reviewed: unchanged  8:52 PM MR of brain essentially negative.  MR of cervical spine showed no cord lesion.  Has degenerative change in the cervical spine.  Advised NSAIDs, rest.     1. Degenerative arthritis  of cervical spine             Carleene Cooper III, MD 06/20/11 2059

## 2011-06-20 NOTE — Telephone Encounter (Signed)
Reason for call: Worsening left arm and now right arm pain.  The patient is a few days out from a right breast lumpectomy and sentinel node dissection.  She had some issues with left arm pain and swelling. Possibly related to an infiltrating IV. She notes that she's taken one Motrin a few times a day and the pain is worsened. And now involves the shoulder. She's feeling some discomfort on the right side. Severe burning and sharp pain. The does not have her narcotics with her. She was staying with her daughter and moved back home. She forgot to bring her narcotics with her.  This has been going on since surgery. She feels like it is worse today  She's eating okay. She is walking okay. The right breast lumpectomy and orbits I feel fine. No discomfort there She said her arms felt very sore and weak and it was difficult to even get out of bed. However she can hold the phone talking on the phone for 15 minutes with me. Mild shortness of breath but no exertional chest pain. Denies chest pressure denies cardiac disease No jaw numbness. Appetite down but no frank nausea or diaphoresis. She's never had anything like this before.  Considered Aleve twice a day. Moist heat around the clock. Get back on narcotics.  I recommended she consider emergency room evaluation to rule out a heart attack although I don't think it's highly likely.  She would like to try the anti-inflammatories and heat first. If not better she will consider emergency room evaluation. At the very least I told her to call us and have a see her tomorrow if possible

## 2011-06-20 NOTE — ED Notes (Addendum)
Pt reporting lumpectomy last week, day after developed some pain at left hand where IV was. Pt called MD office, reporting pain could be from IV medication going in too quick and to give it sometime. Over past few days pain has spread up left arm, shoulder, neck down to right arm. Pt also describes as numbness, tingling. Pt is able to lift arms to level of stomach but with pain. Unable to turn neck due to intense pain. Speech is clear, denying any cp. A x 4. Pt moves extremities but appears moves with caution uppers. When pt arrived here reports was diaphoretic. Skin warm and dry now.

## 2011-06-20 NOTE — Discharge Instructions (Signed)
Rachael [name], you had physical examination, EKG, and MRI of the head and neck to check on you for pains in your neck, back of her head, and arms. Fortunately EKG was normal. The brain MRI showed signs of atrophy, but no bleed or tumor. The cervical MRI showed no injury to the spinal cord but did show degenerative change in the bones of your spine. You will need to take pain medicine such as hydrocodone-acetaminophen for pain, and a nonsteroidal medicine such as Aleve to relieve inflammation and cervical spine.

## 2011-06-20 NOTE — ED Notes (Signed)
Reports recently having right side lumpectomy and at time of anesthesia, had problems with severe left arm pain. Now having pain radiating across to right arm and into her neck.

## 2011-06-20 NOTE — ED Notes (Signed)
When pt returned from MRI c/o right arm pain at elbow, reports MRI was hot against skin. Right elbow has reddened area which is new. No marks visible to left arm.

## 2011-06-20 NOTE — ED Notes (Signed)
Patient transported to MRI 

## 2011-06-21 ENCOUNTER — Telehealth (INDEPENDENT_AMBULATORY_CARE_PROVIDER_SITE_OTHER): Payer: Self-pay | Admitting: General Surgery

## 2011-06-21 NOTE — Telephone Encounter (Signed)
Called patient to inform her that I got her surgical pathology back and after reviewing it with Dr. Jamey Ripa, I told her that her lymph nodes and margins were clear.

## 2011-06-30 NOTE — Telephone Encounter (Signed)
We should take a look at her arm soon

## 2011-07-02 ENCOUNTER — Ambulatory Visit (INDEPENDENT_AMBULATORY_CARE_PROVIDER_SITE_OTHER): Payer: Medicare Other | Admitting: General Surgery

## 2011-07-02 ENCOUNTER — Encounter (INDEPENDENT_AMBULATORY_CARE_PROVIDER_SITE_OTHER): Payer: Self-pay | Admitting: General Surgery

## 2011-07-02 VITALS — BP 152/96 | HR 64 | Temp 97.8°F | Resp 16 | Ht 69.0 in | Wt 254.6 lb

## 2011-07-02 DIAGNOSIS — C50919 Malignant neoplasm of unspecified site of unspecified female breast: Secondary | ICD-10-CM

## 2011-07-02 NOTE — Patient Instructions (Signed)
May return to all normal activities Will arrange for medical and radiation oncology appts

## 2011-07-06 ENCOUNTER — Encounter (INDEPENDENT_AMBULATORY_CARE_PROVIDER_SITE_OTHER): Payer: Self-pay | Admitting: General Surgery

## 2011-07-06 NOTE — Progress Notes (Signed)
Subjective:     Patient ID: Rachael [name], female   DOB: Aug 11, 1933, 78 y.o.   MRN: [id number]  HPI The pt is a 78 yo wf who is 2 weeks out from a right breast lumpectomy and negative sentinel node bx for a T1cN0 right breast cancer. She has done well. She has mild discomfort. Her appetite is good and her bowels are moving normally  Review of Systems     Objective:   Physical Exam On exam her right breast and axillary incisions are healing nicely with no sign of infection or seroma    Assessment:     S/P right lumpectomy and sentinel node bx    Plan:     At this point we will refer her to the medical and radiation oncologists and plan to see her back in 1 month

## 2011-07-12 ENCOUNTER — Telehealth: Payer: Self-pay | Admitting: Oncology

## 2011-07-12 ENCOUNTER — Telehealth: Payer: Self-pay | Admitting: *Deleted

## 2011-07-12 NOTE — Telephone Encounter (Signed)
Pt called to r/s her new pt appts from 08/05/2011 to 08/10/2011 due to the pt wants her daughter to come with her. Confirmed the appt d/t with the pt.

## 2011-07-12 NOTE — Telephone Encounter (Signed)
Patient returned my call and I confirmed 08/05/11 appt w/ pt.  Emailed Cindy at [facility] to make her aware.  Mailed before appt letter & packet to pt.  Took paperwork to Med Rec to scan.

## 2011-07-13 ENCOUNTER — Encounter: Payer: Self-pay | Admitting: Radiation Oncology

## 2011-07-13 NOTE — Progress Notes (Signed)
79 year old female. Divorced.  S/P right breast lumpectomy with negative sentinel node biopsy done 06/16/11 by Dr. Carolynne Edouard for T1cN0 right breast cancer.  Follow up with Dr. Carolynne Edouard in one month.   [id number], percocet, iodine No indication of a pacemaker No previous radiation therapy

## 2011-07-14 ENCOUNTER — Encounter: Payer: Self-pay | Admitting: Radiation Oncology

## 2011-07-14 ENCOUNTER — Ambulatory Visit
Admission: RE | Admit: 2011-07-14 | Discharge: 2011-07-14 | Disposition: A | Discharge status: Home / Self Care | Payer: Medicare Other | Source: Ambulatory Visit | Attending: Radiation Oncology | Admitting: Radiation Oncology

## 2011-07-14 ENCOUNTER — Telehealth (INDEPENDENT_AMBULATORY_CARE_PROVIDER_SITE_OTHER): Payer: Self-pay | Admitting: General Surgery

## 2011-07-14 VITALS — BP 122/75 | HR 63 | Temp 98.1°F | Resp 18 | Ht 69.0 in | Wt 259.5 lb

## 2011-07-14 DIAGNOSIS — C50919 Malignant neoplasm of unspecified site of unspecified female breast: Secondary | ICD-10-CM

## 2011-07-14 DIAGNOSIS — E669 Obesity, unspecified: Secondary | ICD-10-CM | POA: Insufficient documentation

## 2011-07-14 DIAGNOSIS — E039 Hypothyroidism, unspecified: Secondary | ICD-10-CM | POA: Insufficient documentation

## 2011-07-14 DIAGNOSIS — C50319 Malignant neoplasm of lower-inner quadrant of unspecified female breast: Secondary | ICD-10-CM | POA: Insufficient documentation

## 2011-07-14 DIAGNOSIS — Z96649 Presence of unspecified artificial hip joint: Secondary | ICD-10-CM | POA: Insufficient documentation

## 2011-07-14 DIAGNOSIS — Z79899 Other long term (current) drug therapy: Secondary | ICD-10-CM | POA: Insufficient documentation

## 2011-07-14 DIAGNOSIS — I739 Peripheral vascular disease, unspecified: Secondary | ICD-10-CM | POA: Insufficient documentation

## 2011-07-14 DIAGNOSIS — E785 Hyperlipidemia, unspecified: Secondary | ICD-10-CM | POA: Insufficient documentation

## 2011-07-14 NOTE — Addendum Note (Signed)
Encounter addended by: Agnes Lawrence, RN on: 07/14/2011  6:29 PM<BR>     Documentation filed: Inpatient Document Flowsheet, Inpatient Patient Education, Charges VN, Notes Section

## 2011-07-14 NOTE — Progress Notes (Signed)
Radiation Oncology         [phone]) [phone] ________________________________  Initial outpatient Consultation  Name: Rachael [name] MRN: [id number]  Date: 07/14/2011  DOB: [birth year]  REFERRING PHYSICIAN: Robyne Askew, MD  DIAGNOSIS: T1cN0 Invasive Ductal Carcinoma of the right breast s/p lumpectomy  HISTORY OF PRESENT ILLNESS::Rachael [name] is a 78 y.o. female  who presented for screening mammogram. She was found to have an abnormality in the lower inner aspect of the right breast. She was called back for ultrasound and found to have a 9 mm lesion. A biopsy was performed which confirmed an invasive mammary carcinoma. MRI of the bilateral breasts was performed which showed only this abnormality and no other abnormalities within the breast lymph nodes or contralateral breast. She met with Dr. Carolynne Edouard and elected for breast conservation. She had her surgery performed on 06/16/2011 which showed a 1.5 cm invasive grade 1 lobular carcinoma with negative margins. The closest margin was 9 mm. One lymph node was removed and was benign. Her tumor was ER and PR positive HER-2 negative. Her postoperative course was complicated by arm swelling secondary to her anesthesia infection as well some superficial skin burns after receiving an MRI scan. He is recovered from this incident and has been cleared surgically by Dr. Carolynne Edouard. She is set up to see Dr. Darnelle Catalan on July 25. She presents with her daughter who has recently completed breast cancer treatment. She has some left arm pain due to anesthesia but otherwise has no side effects from her treatments. 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 She is very active and full social schedule.Marland Kitchen  PREVIOUS RADIATION THERAPY: No  PAST MEDICAL HISTORY:  has a past medical history of Chronic venous insufficiency; PVD (peripheral vascular disease); Osteoarthritis; Obesity; CONTACT DERMATITIS; GLUCOSE INTOLERANCE; VITAMIN D DEFICIENCY; Edema; CAROTID  STENOSIS; DUPUYTREN'S CONTRACTURE; MENOPAUSAL SYNDROME; HLD (hyperlipidemia); Hypothyroidism; Breast cancer; Headache; and Complication of anesthesia.    PAST SURGICAL HISTORY: Past Surgical History  Procedure Date  . 2d echo 11/29/03    normal LV size. normal EF   . Normal caronary arteries     per pt. 2003  . Cholecystectomy   . Inguinal hernia repair   . Tonsillectomy   . Right rotator cuff surgery   . Total hip arthroplasty 05/2008 and 10/2008    B hip - alusio  . Hernia repair     incisional hernia  . Breast biopsy 05/11/11    right breast  . Breast lumpectomy     right breast  . Back surgery 1983    lumbarectomy  . Finger surgery     FAMILY HISTORY: family history includes Cancer in her cousin, daughter, maternal grandfather, maternal uncle, and mother; Coronary artery disease in an unspecified family member; and Hypothyroidism in an unspecified family member.  There is no history of Anesthesia problems.  SOCIAL HISTORY:  reports that she has never smoked. She has never used smokeless tobacco. She reports that she does not drink alcohol or use illicit drugs.  ALLERGIES: Shellfish allergy; Percocet; and Iodine  MEDICATIONS:  Current Outpatient Prescriptions  Medication Sig Dispense Refill  . cholecalciferol (VITAMIN D) 1000 UNITS tablet Take 2,000 Units by mouth daily.      . Cyanocobalamin (B-12 PO) Take 1 tablet by mouth daily.       . furosemide (LASIX) 40 MG tablet       . levothyroxine (SYNTHROID, LEVOTHROID) 137 MCG tablet Take 137 mcg by mouth  daily.       . Nutritional Supplements (JUICE PLUS FIBRE) LIQD Take 4 capsules by mouth 3 (three) times daily. 4 vegetable capsules with breakfast, 4 fruit capsules with lunch and 4 berry capsules with dinner.      . Omega-3 350 MG CAPS Take 2 capsules by mouth daily.        Marland Kitchen HYDROcodone-acetaminophen (NORCO) 5-325 MG per tablet         REVIEW OF SYSTEMS:  A 15 point review of systems is documented in the electronic  medical record. This was obtained by the nursing staff. However, I reviewed this with the patient to discuss relevant findings and make appropriate changes.  Pertinent items are noted in HPI.   PHYSICAL EXAM:  height is 5\' 9"  (1.753 m) and weight is 259 lb 8 oz (117.708 kg). Her oral temperature is 98.1 F (36.7 C). Her blood pressure is 122/75 and her pulse is 63. Her respiration is 18.   Marland Kitchen He is a pleasant female in no distress sitting comfortably examining table. She has a well-healed surgical incision in her right axilla. Her right lumpectomy incision is healing well. There are no palpable abnormalities the left breast. She is alert and oriented x3.  LABORATORY DATA:  Lab Results  Component Value Date   WBC 9.2 06/10/2011   HGB 13.6 06/10/2011   HCT 40.2 06/10/2011   MCV 87.8 06/10/2011   PLT 288 06/10/2011   Lab Results  Component Value Date   NA 140 06/10/2011   K 3.6 06/10/2011   CL 99 06/10/2011   CO2 31 06/10/2011   Lab Results  Component Value Date   ALT 14 06/10/2011   AST 19 06/10/2011   ALKPHOS 79 06/10/2011   BILITOT 0.8 06/10/2011     RADIOGRAPHY: Mr Brain Wo Contrast  06/20/2011  *RADIOLOGY REPORT*  Clinical Data: The patient had breast surgery 3 days ago.  Now has neck and bilateral arm pain and weakness.  MRI HEAD WITHOUT CONTRAST  Technique:  Multiplanar, multiecho pulse sequences of the brain and surrounding structures were obtained according to standard protocol without intravenous contrast.  Comparison: None.  Findings: There is no evidence for acute infarction, intracranial hemorrhage, mass lesion, hydrocephalus, or extra-axial fluid. Mild atrophy is present.  Mild increased signal in the subcortical and periventricular white matter is noted, likely chronic microvascular ischemic change.  No foci of chronic hemorrhage.  No midline shift. Major intracranial vascular structures are patent. The skull base and calvarium appear intact.  Mild pannus.  Partially empty sella. No  tonsillar herniation.  Negative orbits.  No acute sinus or mastoid disease.  IMPRESSION: Chronic changes as described.  Mild atrophy and mild chronic microvascular ischemic change.  No acute stroke or bleed.  Original Report Authenticated By: Elsie Stain, M.D.   Mr Cervical Spine Wo Contrast  06/20/2011  *RADIOLOGY REPORT*  Clinical Data: Bilateral arm pain and weakness.  Recent right breast lumpectomy.  MRI CERVICAL SPINE WITHOUT CONTRAST  Technique:  Multiplanar and multiecho pulse sequences of the cervical spine, to include the craniocervical junction and cervicothoracic junction, were obtained according to standard protocol without intravenous contrast.  Comparison: Cervical MRI 12/18/2010.  Findings: The alignment is stable and near anatomic.  There is a mild scoliosis.  There is no evidence of acute fracture or metastatic disease.  The craniocervical junction appears normal.  The cervical cord is normal in signal and caliber.  There are bilateral vertebral artery flow voids.  The patient appears  to have an aberrant right subclavian artery passing between the spine and the esophagus on the sagittal images.  C2-C3:  Asymmetric facet hypertrophy on the left.  No significant spinal stenosis.  C3-C4:  There is advanced facet disease, left greater than right. There is mild associated left foraminal stenosis which appears stable.  No cord deformity is present.  C4-C5:  There is spondylosis with asymmetric uncinate spurring on the right and mild bilateral facet hypertrophy.  Mild to moderate right foraminal stenosis is stable.  C5-C6:  Mild disc bulging and uncinate spurring.  No cord deformity or significant foraminal compromise.  C6-C7:  Mild disc bulging and uncinate spurring.  No cord deformity or significant foraminal compromise.  C7-C1:  Facet hypertrophy, worse on the left.  Moderate left foraminal stenosis appears stable.  IMPRESSION:  1.  Stable multilevel spondylosis with resulting foraminal narrowing  as described above. 2.  Advanced facet hypertrophy on the left at C3-C4. 3.  No evidence of acute fracture or metastatic disease. No cord deformity.  Original Report Authenticated By: Gerrianne Scale, M.D.   Nm Sentinel Node Inj-no Rpt (breast)  06/16/2011  CLINICAL DATA: right breast cancer   Sulfur colloid was injected intradermally by the nuclear medicine  technologist for breast cancer sentinel node localization.        IMPRESSION: T1 C. N0 invasive lobular carcinoma of the right breast status post lumpectomy with widely negative margins ER PR positive  PLAN: Ms. Twichell and her daughter and I discussed the role of radiation and decreasing local failures. We discussed the results of randomized trial showing no survival benefit to mastectomy over lumpectomy. We discussed the increase in local control seen in patients who receive radiation after lumpectomy. We discussed the process of simulation the placement tattoos. We discussed the possible side effects of treatment including but not limited to skin redness fatigue and damage to critical normal structures in the chest. Her daughter has received radiation is of course familiar with these. We then discussed the results of randomized trials showing no survival benefit to radiation in addition to tamoxifen in elderly women. We discussed the recent update of the CALGB trial looking at women over 70. We discussed that radiation would be 6 weeks as an outpatient for about 20 minutes a day versus taking an antiestrogen or tamoxifen for 5 years. I encouraged her to discuss this issue with Dr. Darnelle Catalan. He discussed compliance issues related to medication as well as the possible side effects of some of the aromatase inhibitors. Ultimately I left the decision with her and told her I would be supportive of any decision that she chooses. She has minor his card and will give [place] a call if she ultimately decide to proceed forward with radiation. I offered to move her  appointment with medical oncology up but she would like to keep this the way that it is so that she can attend her mother's 95th birthday party. We also discussed referral to genetic counseling given her family history. Her daughter was tested 3 years ago but would like to explore if there are any other options available for testing at this time. I spent 60 minutes  face to face with the patient and more than 50% of that time was spent in counseling and/or coordination of care.   ------------------------------------------------  Lurline Hare, MD

## 2011-07-14 NOTE — Progress Notes (Signed)
See progress note under physician encounter. 

## 2011-07-14 NOTE — Telephone Encounter (Signed)
Pt calling per radiology concern.  She has exposed herself to poison ivy and has outbreak on hands.  One area is about dime-sized and pocketed.  Reassured pt that this does not need to be lanced.  She should avoid touching it at all, cover with bandaid to prevent spreading.  OK to use hydrocortisone cream, but apply cream to bandaid pad, then place on site.  Run water down hands to thoroughly rinse before using soap to wash.  She understands.

## 2011-07-14 NOTE — Progress Notes (Signed)
Patient presents to the clinic today accompanied by her daughter, Delorise Jackson, for a consultation with Dr. Michell Heinrich reference role of radiation therapy in the treatment of right breast ca. Patient is alert and oriented to person, place, and time. No distress noted. Steady gait noted. Pleasant affect noted. Patient denies pain at this time. Patient denies headache, dizziness, diplopia, diarrhea, or constipation. Patient reports eating and sleeping without difficulty. Patient reports that her weight is stable. Patient reports poison ivy on her right and left arm. Patient reports intermittent episode of nausea but, none since three days ago. Patient reports her right axilla remain tender. Incision on right breast following surgery are well approximated without redness, drainage or edema. Patient denies nipple discharge. Patient denies breast pain. Full ROM of motion in both upper extremities. Reported all findings to Dr. Michell Heinrich.  POST OP RECHECK WITH TOTH ON 7/26 NEW CONSULT WITH MAGRINAT 7/30

## 2011-07-14 NOTE — Progress Notes (Signed)
Complete PATIENT MEASURE OF DISTRESS worksheet with a score of 1 submitted to social work. 

## 2011-07-16 ENCOUNTER — Other Ambulatory Visit: Payer: Self-pay | Admitting: Oncology

## 2011-07-16 ENCOUNTER — Ambulatory Visit (INDEPENDENT_AMBULATORY_CARE_PROVIDER_SITE_OTHER): Payer: Medicare Other | Admitting: Endocrinology

## 2011-07-16 ENCOUNTER — Encounter: Payer: Self-pay | Admitting: Endocrinology

## 2011-07-16 VITALS — BP 112/58 | HR 56 | Temp 98.0°F

## 2011-07-16 DIAGNOSIS — L259 Unspecified contact dermatitis, unspecified cause: Secondary | ICD-10-CM

## 2011-07-16 DIAGNOSIS — C50919 Malignant neoplasm of unspecified site of unspecified female breast: Secondary | ICD-10-CM

## 2011-07-16 MED ORDER — TRIAMCINOLONE ACETONIDE 0.1 % EX CREA: TOPICAL_CREAM | Freq: Three times a day (TID) | CUTANEOUS | Status: DC

## 2011-07-16 MED ORDER — METHYLPREDNISOLONE (PAK) 4 MG PO TABS: ORAL_TABLET | ORAL | Status: AC

## 2011-07-16 NOTE — Patient Instructions (Addendum)
i have sent 2 prescriptions to your pharmacy: steroid "pack," and steroid cream.  I hope you feel better soon.  If you don't feel better by next week, please call back.  Please call sooner if you get worse.

## 2011-07-16 NOTE — Progress Notes (Signed)
Subjective:    Patient ID: Rachael [name], female    DOB: [birth year], 78 y.o.   MRN: [id number]  HPI Pt states a few days of severe itching of the forearms, and assoc rash.  She was working in the yard.   Past Medical History  Diagnosis Date  . Chronic venous insufficiency   . PVD (peripheral vascular disease)     mild carotid 11/05  . Osteoarthritis     bilat hips, severe  . Obesity   . CONTACT DERMATITIS   . GLUCOSE INTOLERANCE   . VITAMIN D DEFICIENCY   . Edema     pt  states had edema of feet and legs has been on lasix per dr. to help  . CAROTID STENOSIS   . DUPUYTREN'S CONTRACTURE   . MENOPAUSAL SYNDROME   . HLD (hyperlipidemia)   . Hypothyroidism     Dr. Cherlyn Roberts in [facility]   . Breast cancer     R  . Headache     patients reports r/t pinched nerve  . Complication of anesthesia     Past Surgical History  Procedure Date  . 2d echo 11/29/03    normal LV size. normal EF   . Normal caronary arteries     per pt. 2003  . Cholecystectomy   . Inguinal hernia repair   . Tonsillectomy   . Right rotator cuff surgery   . Total hip arthroplasty 05/2008 and 10/2008    B hip - alusio  . Hernia repair     incisional hernia  . Breast biopsy 05/11/11    right breast  . Breast lumpectomy     right breast  . Back surgery 1983    lumbarectomy  . Finger surgery     History   Social History  . Marital Status: Divorced    Spouse Name: N/A    Number of Children: N/A  . Years of Education: N/A   Occupational History  . Not on file.   Social History Main Topics  . Smoking status: Never Smoker   . Smokeless tobacco: Never Used   Comment: Divorced, lives with 72 yo mother  . Alcohol Use: No  . Drug Use: No  . Sexually Active: Not Currently   Other Topics Concern  . Not on file   Social History Narrative   Divorced, lives with 41 yo mother.Work-sales. Water aerobics 6/week- 3d water, 3d cardio.     Current Outpatient Prescriptions on File Prior to Visit    Medication Sig Dispense Refill  . cholecalciferol (VITAMIN D) 1000 UNITS tablet Take 2,000 Units by mouth daily.      . Cyanocobalamin (B-12 PO) Take 1 tablet by mouth daily.       Marland Kitchen levothyroxine (SYNTHROID, LEVOTHROID) 137 MCG tablet Take 137 mcg by mouth daily.       . Nutritional Supplements (JUICE PLUS FIBRE) LIQD Take 4 capsules by mouth 3 (three) times daily. 4 vegetable capsules with breakfast, 4 fruit capsules with lunch and 4 berry capsules with dinner.      . Omega-3 350 MG CAPS Take 2 capsules by mouth daily.          Allergies  Allergen Reactions  . Shellfish Allergy Anaphylaxis and Hives    Hives all over the body.  . Percocet (Oxycodone-Acetaminophen) Hives  . Iodine Hives    Family History  Problem Relation Age of Onset  . Coronary artery disease      family hx - female 1st degree  relative <60  . Hypothyroidism      aunt   . Cancer Mother     uterine  . Cancer Daughter     breast  . Cancer Maternal Grandfather     pancreatic  . Anesthesia problems Neg Hx   . Cancer Maternal Uncle     prostate  . Cancer Cousin     colon    BP 112/58  Pulse 56  Temp 98 F (36.7 C) (Oral)  SpO2 97%  Review of Systems Denies fever    Objective:   Physical Exam VITAL SIGNS:  See vs page.   GENERAL: no distress. Forearms: moderate contact dermatitis, with a bulla at the right wrist.     Assessment & Plan:  Contact dermatitis, new

## 2011-07-26 ENCOUNTER — Telehealth (INDEPENDENT_AMBULATORY_CARE_PROVIDER_SITE_OTHER): Payer: Self-pay | Admitting: General Surgery

## 2011-07-26 NOTE — Telephone Encounter (Signed)
Spoke with patient and let her know that the Rx was faxed to them.

## 2011-07-26 NOTE — Telephone Encounter (Signed)
Please FAX prescription for compression sleeve for her arm to [facility].  She is flying out on Wednesday and would like to have it to take with her.  Please call her when the Rx is FAXd so she can go get it this afternoon.

## 2011-08-05 ENCOUNTER — Ambulatory Visit: Payer: Medicare Other | Admitting: Oncology

## 2011-08-05 ENCOUNTER — Ambulatory Visit: Payer: Medicare Other

## 2011-08-05 ENCOUNTER — Other Ambulatory Visit: Payer: Medicare Other | Admitting: Lab

## 2011-08-06 ENCOUNTER — Encounter (INDEPENDENT_AMBULATORY_CARE_PROVIDER_SITE_OTHER): Payer: Self-pay | Admitting: General Surgery

## 2011-08-06 ENCOUNTER — Ambulatory Visit (INDEPENDENT_AMBULATORY_CARE_PROVIDER_SITE_OTHER): Payer: Medicare Other | Admitting: General Surgery

## 2011-08-06 VITALS — BP 130/84 | HR 82 | Temp 97.6°F | Resp 14 | Ht 69.0 in | Wt 252.8 lb

## 2011-08-06 DIAGNOSIS — C50919 Malignant neoplasm of unspecified site of unspecified female breast: Secondary | ICD-10-CM

## 2011-08-06 NOTE — Progress Notes (Signed)
Subjective:     Patient ID: [name], female   DOB: [birth year], 78 y.o.   MRN: [id number]  HPI The patient is a 78 year old white female who is about 6 weeks out from a right breast lumpectomy and negative sentinel node biopsy for a T1 C. N0 breast cancer. She is doing very well and has no complaints today. She denies any breast pain. She is meeting with the oncologist on Wednesday.  Review of Systems     Objective:   Physical Exam On exam her right breast and axillary incisions are healing nicely. There is no sign of infection. She may have a small seroma in the right axilla but does feel small and may resolve on its own over time    Assessment:     6 weeks status post right lumpectomy and negative sentinel node biopsy    Plan:     At this point she will meet with the medical and radiation oncologist to discuss adjuvant therapy. We will plan to see her back in about 3 months.

## 2011-08-10 ENCOUNTER — Ambulatory Visit (HOSPITAL_BASED_OUTPATIENT_CLINIC_OR_DEPARTMENT_OTHER): Payer: Medicare Other | Admitting: Oncology

## 2011-08-10 ENCOUNTER — Ambulatory Visit: Payer: Medicare Other

## 2011-08-10 ENCOUNTER — Other Ambulatory Visit (HOSPITAL_BASED_OUTPATIENT_CLINIC_OR_DEPARTMENT_OTHER): Payer: Medicare Other | Admitting: Lab

## 2011-08-10 VITALS — BP 123/71 | HR 66 | Temp 98.3°F | Ht 68.0 in | Wt 258.7 lb

## 2011-08-10 DIAGNOSIS — C50219 Malignant neoplasm of upper-inner quadrant of unspecified female breast: Secondary | ICD-10-CM

## 2011-08-10 DIAGNOSIS — C50919 Malignant neoplasm of unspecified site of unspecified female breast: Secondary | ICD-10-CM

## 2011-08-10 DIAGNOSIS — M1990 Unspecified osteoarthritis, unspecified site: Secondary | ICD-10-CM

## 2011-08-10 DIAGNOSIS — Z170 Estrogen receptor positive status [ER+]: Secondary | ICD-10-CM

## 2011-08-10 DIAGNOSIS — I739 Peripheral vascular disease, unspecified: Secondary | ICD-10-CM

## 2011-08-10 LAB — COMPREHENSIVE METABOLIC PANEL
CO2: 28 mEq/L (ref 19–32)
Calcium: 9.7 mg/dL (ref 8.4–10.5)
Chloride: 102 mEq/L (ref 96–112)
Glucose, Bld: 156 mg/dL — ABNORMAL HIGH (ref 70–99)
Sodium: 140 mEq/L (ref 135–145)
Total Bilirubin: 0.7 mg/dL (ref 0.3–1.2)
Total Protein: 6.7 g/dL (ref 6.0–8.3)

## 2011-08-10 LAB — CBC WITH DIFFERENTIAL/PLATELET
Basophils Absolute: 0 10*3/uL (ref 0.0–0.1)
Eosinophils Absolute: 0.3 10*3/uL (ref 0.0–0.5)
HGB: 12.7 g/dL (ref 11.6–15.9)
LYMPH%: 27.7 % (ref 14.0–49.7)
MCV: 89.1 fL (ref 79.5–101.0)
MONO%: 8.9 % (ref 0.0–14.0)
NEUT#: 4.8 10*3/uL (ref 1.5–6.5)
Platelets: 282 10*3/uL (ref 145–400)

## 2011-08-10 MED ORDER — ANASTROZOLE 1 MG PO TABS: 1.0000 mg | ORAL_TABLET | Freq: Every day | ORAL | Status: DC

## 2011-08-10 MED ORDER — ANASTROZOLE 1 MG PO TABS: 1.0000 mg | ORAL_TABLET | Freq: Every day | ORAL | Status: AC

## 2011-08-10 NOTE — Progress Notes (Signed)
ID: [name]   DOB: 11/07/1933  MR#: [id number]  ZHY#:[id number]  HISTORY OF PRESENT ILLNESS:  Nielle had routine screening mammography at [facility] 05/06/2011, showing an area of focal asymmetry in the lower inner quadrant of the right breast. Additional views 05/10/2011 confirmed a 9 mm area of irregularity without associated microcalcifications. Ultrasound of the right breast confirmed a 9 mm hypoechoic mass with posterior acoustic shadowing. Biopsy of this mass was performed the next day, and showed ([id number]) and invasive lobular carcinoma, E-cadherin negative, which was estrogen receptor positive at 97%, and progesterone receptor positive at 81%. There was no HER-2 amplification, and MIB-1 was 12%.  On may 07/31/2011 him of the patient underwent breast MRI, which showed a 1.5 cm mildly irregular enhancing mass in the upper inner quadrant of the right breast. There were no other masses of concern and no abnormal appearing lymph nodes. Accordingly, on 06/16/2011 the patient underwent right lumpectomy and sentinel lymph node sampling under Dr. Felicity Pellegrini, the final pathology (SZ [id number]) showing an invasive lobular carcinoma, grade 1, measuring 1.5 cm, with both sentinel lymph nodes negative. The patient's subsequent history is as detailed below.  INTERVAL HISTORY: The patient was seen at the breast cancer clinic 08/10/2011, accompanied by her daughter Madison Hickman  REVIEW OF SYSTEMS: Chontel did well with her surgery, without unusual bleeding, pain, fever, swelling, or erythema. She likes to bites and do water aerobics, has more difficulty walking (she status post bilateral hip replacements). She denies a history of migraines, seizures, strokes, glaucoma, cataracts, GERD, that the culture disease, asthma, emphysema, any heart symptoms, or change in bowel or bladder habits. A detailed review of systems today was entirely negative.  PAST MEDICAL HISTORY: Past Medical History  Diagnosis Date  . Chronic  venous insufficiency   . PVD (peripheral vascular disease)     mild carotid 11/05  . Osteoarthritis     bilat hips, severe  . Obesity   . CONTACT DERMATITIS   . GLUCOSE INTOLERANCE   . VITAMIN D DEFICIENCY   . Edema     pt  states had edema of feet and legs has been on lasix per dr. to help  . CAROTID STENOSIS   . DUPUYTREN'S CONTRACTURE   . MENOPAUSAL SYNDROME   . HLD (hyperlipidemia)   . Hypothyroidism     Dr. Cherlyn Roberts in [place] end[facility]   . Breast cancer     R  . Headache     patients reports r/t pinched nerve  . Complication of anesthesia     PAST SURGICAL HISTORY: Past Surgical History  Procedure Date  . 2d echo 11/29/03    normal LV size. normal EF   . Normal caronary arteries     per pt. 2003  . Cholecystectomy   . Inguinal hernia repair   . Tonsillectomy   . Right rotator cuff surgery   . Total hip arthroplasty 05/2008 and 10/2008    B hip - alusio  . Hernia repair     incisional hernia  . Breast biopsy 05/11/11    right breast  . Breast lumpectomy     right breast  . Back surgery 1983    lumbarectomy  . Finger surgery     FAMILY HISTORY Family History  Problem Relation Age of Onset  . Coronary artery disease      family hx - female 1st degree relative <60  . Hypothyroidism      aunt   . Cancer Mother  uterine  . Cancer Daughter     breast  . Cancer Maternal Grandfather     pancreatic  . Anesthesia problems Neg Hx   . Cancer Maternal Uncle     prostate  . Cancer Cousin     colon   the patient's father died at the age of 32 from heart disease. The patient's mother is alive at age 77 as of July of 2013. She had uterine cancer in her 64s. There is a significant cancer family on her side, to be detailed when the patient meets with her genetics counselor. The patient herself has no sisters, does have 3 brothers. One of the patient's daughters, Margarite Gouge, was diagnosed with breast cancer at the age of 20. She was tested for the BRCA gene and was  negative. There is no other breast or ovarian cancer in the immediate family to her knowledge.  GYNECOLOGIC HISTORY: Menarche age 27, menopause age 64. The patient tried Prempro briefly but did not like it. She is GX P5, first live birth age 89.  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 She is divorced and lives by herself. 3 of her 5 children live in town ([place], [place], and [place]) and 2 live in [place] and [place]). She has 7 grandchildren. She attends [employer] [address].   ADVANCED DIRECTIVES:  HEALTH MAINTENANCE: History  Substance Use Topics  . Smoking status: Never Smoker   . Smokeless tobacco: Never Used   Comment: Divorced, lives with 65 yo mother  . Alcohol Use: No     Colonoscopy: 2009  PAP: Grewal  Bone density: at GI   Lipid panel: lebscher  Allergies  Allergen Reactions  . Shellfish Allergy Anaphylaxis and Hives    Hives all over the body.  . Percocet (Oxycodone-Acetaminophen) Hives  . Iodine Hives    Current Outpatient Prescriptions  Medication Sig Dispense Refill  . cholecalciferol (VITAMIN D) 1000 UNITS tablet Take 2,000 Units by mouth daily.      . Cyanocobalamin (B-12 PO) Take 1 tablet by mouth daily.       Marland Kitchen levothyroxine (SYNTHROID, LEVOTHROID) 137 MCG tablet Take 137 mcg by mouth daily.       . Nutritional Supplements (JUICE PLUS FIBRE) LIQD Take 4 capsules by mouth 3 (three) times daily. 4 vegetable capsules with breakfast, 4 fruit capsules with lunch and 4 berry capsules with dinner.      . Omega-3 350 MG CAPS Take 2 capsules by mouth daily.        Marland Kitchen triamterene-hydrochlorothiazide (DYAZIDE) 37.5-25 MG per capsule Take 1 capsule by mouth every morning.        OBJECTIVE: Middle-aged white woman who appears comfortable Filed Vitals:   08/10/11 1615  BP: 123/71  Pulse: 66  Temp: 98.3 F (36.8 C)     Body mass index is 39.34 kg/(m^2).    ECOG FS: 0  Sclerae unicteric Oropharynx  clear No cervical or supraclavicular adenopathy Lungs no rales or rhonchi Heart regular rate and rhythm Abd benign MSK no focal spinal tenderness, no peripheral edema Neuro: nonfocal Breasts: The right breast is status post lumpectomy. There is no dehiscence, erythema, swelling, or unusual tenderness. The right axilla is benign to palpation. The left breast and left axilla are unremarkable.  LAB RESULTS: Lab Results  Component Value Date   WBC 8.0 08/10/2011   NEUTROABS 4.8 08/10/2011   HGB 12.7 08/10/2011   HCT 38.6 08/10/2011   MCV  89.1 08/10/2011   PLT 282 08/10/2011      Chemistry      Component Value Date/Time   NA 140 06/10/2011 1413   K 3.6 06/10/2011 1413   CL 99 06/10/2011 1413   CO2 31 06/10/2011 1413   BUN 26* 06/10/2011 1413   CREATININE 1.07 06/10/2011 1413      Component Value Date/Time   CALCIUM 9.9 06/10/2011 1413   ALKPHOS 79 06/10/2011 1413   AST 19 06/10/2011 1413   ALT 14 06/10/2011 1413   BILITOT 0.8 06/10/2011 1413       No results found for this basename: LABCA2    No components found with this basename: LABCA125    No results found for this basename: INR:1;PROTIME:1 in the last 168 hours  Urinalysis    Component Value Date/Time   COLORURINE LT. YELLOW 08/28/2010 1548   APPEARANCEUR CLEAR 08/28/2010 1548   LABSPEC 1.025 08/28/2010 1548   PHURINE 6.0 08/28/2010 1548   GLUCOSEU NEGATIVE 08/07/2010 1633   HGBUR TRACE-LYSED 08/28/2010 1548   BILIRUBINUR NEGATIVE 08/28/2010 1548   KETONESUR NEGATIVE 08/28/2010 1548   PROTEINUR NEGATIVE 08/07/2010 1633   UROBILINOGEN 0.2 08/28/2010 1548   NITRITE NEGATIVE 08/28/2010 1548   LEUKOCYTESUR TRACE 08/28/2010 1548    STUDIES: No results found.  ASSESSMENT: 78 y.o. [place] woman s/p Right lumpectomy and sentinel lymph node sampling 06/16/2011 for a T1c N0 invasive lobular breast cancer, grade 1, strongly estrogen and progesterone positive, with no HER-2 amplification, and an Mib-1 of 12%  PLAN: We spent the better  part of her more than one hour long visit today going over the biology of her tumor, and the biology of breast cancer in general. This information was given to her in writing and it is also copied into the chart. Basically, she has what is most likely a luminal A type breast cancer, with a very good prognosis overall. Given the fact that this was a grade 1 tumor, with margins of nearly 1 cm, I do not believe radiation therapy would add much in terms of local control to systemic antiestrogen therapy, and we have good data that it would not affect survival. For this reason she is comfortable forgoing that treatment modality, and so am I.  The adjuvant! Program would quote her a risk of recurrence in the 17% range, dropping by 9% if she took aromatase inhibitors for 5 years, leaving her with a risk of recurrence of about 8%. The mortality risk is much lower, about 2%.  Chemotherapy might drop that risk minimally if at all, and is not recommended.  We then discussed the various types of antiestrogen, and in her situation I think starting with anastrozole would be a good choice. If she tolerates it all we'll have to do is continue that medication for 5 years. We discussed the possible side effects, toxicities and complications of aromatase inhibitors and in particular issues relating to osteoporosis prevention. She will start calcium and vitamin D twice daily, increase her walking program if possible, and we will set her up for routine bone density prior to her next visit here which will be in approximately 2 months. If she is tolerating the anastrozole well at that point, we will start seeing her on a every 6 month basis alternating with Dr. Carolynne Edouard.   MAGRINAT,GUSTAV C    08/10/2011

## 2011-08-12 ENCOUNTER — Encounter: Payer: Self-pay | Admitting: *Deleted

## 2011-08-12 ENCOUNTER — Ambulatory Visit (HOSPITAL_BASED_OUTPATIENT_CLINIC_OR_DEPARTMENT_OTHER): Payer: Medicare Other | Admitting: Genetic Counselor

## 2011-08-12 ENCOUNTER — Other Ambulatory Visit: Payer: Medicare Other | Admitting: Lab

## 2011-08-12 ENCOUNTER — Encounter: Payer: Self-pay | Admitting: Genetic Counselor

## 2011-08-12 DIAGNOSIS — Z808 Family history of malignant neoplasm of other organs or systems: Secondary | ICD-10-CM

## 2011-08-12 DIAGNOSIS — C50919 Malignant neoplasm of unspecified site of unspecified female breast: Secondary | ICD-10-CM

## 2011-08-12 DIAGNOSIS — IMO0002 Reserved for concepts with insufficient information to code with codable children: Secondary | ICD-10-CM

## 2011-08-12 DIAGNOSIS — C50219 Malignant neoplasm of upper-inner quadrant of unspecified female breast: Secondary | ICD-10-CM

## 2011-08-12 DIAGNOSIS — Z803 Family history of malignant neoplasm of breast: Secondary | ICD-10-CM

## 2011-08-12 NOTE — Progress Notes (Signed)
Dr. Darnelle Catalan requested a consultation for genetic counseling and risk assessment for Rachael [name], a 78 y.o. female, for discussion of her personal history of cancer. She presents to clinic today to discuss the possibility of a genetic predisposition to cancer, and to further clarify her risks, as well as her family members' risks for cancer.   HISTORY OF PRESENT ILLNESS: In 2013, at the age of 78, Rachael [name] was diagnosed with invasive ductal carcinoma. This was treated with surgery on June 16, 2011.    Past Medical History  Diagnosis Date  . Chronic venous insufficiency   . PVD (peripheral vascular disease)     mild carotid 11/05  . Osteoarthritis     bilat hips, severe  . Obesity   . CONTACT DERMATITIS   . GLUCOSE INTOLERANCE   . VITAMIN D DEFICIENCY   . Edema     pt  states had edema of feet and legs has been on lasix per dr. to help  . CAROTID STENOSIS   . DUPUYTREN'S CONTRACTURE   . MENOPAUSAL SYNDROME   . HLD (hyperlipidemia)   . Hypothyroidism     Dr. Cherlyn Roberts in [place] end[facility]   . Breast cancer     R  . Headache     patients reports r/t pinched nerve  . Complication of anesthesia     Past Surgical History  Procedure Date  . 2d echo 11/29/03    normal LV size. normal EF   . Normal caronary arteries     per pt. 2003  . Cholecystectomy   . Inguinal hernia repair   . Tonsillectomy   . Right rotator cuff surgery   . Total hip arthroplasty 05/2008 and 10/2008    B hip - alusio  . Hernia repair     incisional hernia  . Breast biopsy 05/11/11    right breast  . Breast lumpectomy     right breast  . Back surgery 1983    lumbarectomy  . Finger surgery     History  Substance Use Topics  . Smoking status: Never Smoker   . Smokeless tobacco: Never Used   Comment: Divorced, lives with 65 yo mother  . Alcohol Use: No    REPRODUCTIVE HISTORY AND PERSONAL RISK ASSESSMENT FACTORS: Menarche was at age [age].   Menopause around age 58 Uterus Intact:  Yes Ovaries Intact: Yes G5P5A0 , first live birth at age 50  She has not previously undergone treatment for infertility.   OCP use for 3-4 years   She has not used HRT in the past.    FAMILY HISTORY:  We obtained a detailed, 4-generation family history.  Significant diagnoses are listed below: Family History  Problem Relation Age of Onset  . Coronary artery disease      family hx - female 1st degree relative <60  . Hypothyroidism      aunt   . Uterine cancer Mother 57  . Breast cancer Daughter 42    BRCA negative (no BART)  . Pancreatic cancer Maternal Grandfather     diagnosed in his 81s  . Anesthesia problems Neg Hx   . Prostate cancer Maternal Uncle     diagnosed in his 48s  . Colon cancer Cousin   . Prostate cancer Paternal Uncle     diagnosed in his 69s  . Clotting disorder Maternal Grandmother   . Uterine cancer Cousin   . Leukemia Cousin   . Prostate cancer Cousin   The patient was  diagnosed with breast cancer at age 59.  Her daughter was diagnosed with breast cancer at age 28 and had BRCA testing performed (before BART was routinely done) which was negative.  The patient's mother was diagnosed with uterine cancer at age 81.  Her mother had two sisters and two brothers.  One brother had prostate cancer, and he had a son with prostate cancer as well.  One sister had a daughter with colon cancer and another daughter with uterine cancer.  The other sister had a son who died of leukemia.  The patient's maternal grandfather had pancreatic cancer.  The patient's paternal uncle had prostate cancer in his 79s.  There is no other reported cancer history on either side of the family.  Patient's maternal ancestors are of [place] descent, and paternal ancestors are of [place] and [place] descent. There is no reported Ashkenazi Jewish ancestry. There is no  known consanguinity.  GENETIC COUNSELING RISK ASSESSMENT, DISCUSSION, AND SUGGESTED FOLLOW UP: We reviewed the natural history and  genetic etiology of sporadic, familial and hereditary cancer syndromes.  About 5-10% of breast cancer is hereditary.  Of this, about 85% is the result of a BRCA1 or BRCA2 mutation.  We reviewed the red flags of hereditary cancer syndromes and the dominant inheritance patterns.  Because of her family history of uterine, colon, pancreatic and breast cancer we discussed Lynch syndrome. Some Lynch syndrome families have breast cancer.  Therefore, if the BRCA testing is negative, we discussed that we could be testing for the wrong gene.  We discussed gene panels, and that several cancer genes that are associated with different cancers can be tested at the same time.  Because of the different types of cancer that are in the patient's family, we will draw the cancernext panel through [place] [facility].  The patient's personal and family history is suggestive of the following possible diagnosis: hereditary cancer syndrome  We discussed that identification of a hereditary cancer syndrome may help her care providers tailor the patients medical management. If a mutation indicating a hereditary cancer syndrome is detected in this case, the [employer] recommendations would include increased cancer surveillance and possible prophylactic surgery. If a mutation is detected, the patient will be referred back to the referring provider and to any additional appropriate care providers to discuss the relevant options.   If a mutation is not found in the patient, this will decrease the likelihood of a hereditary cancer syndrome as the explanation for her breast cancer. Cancer surveillance options would be discussed for the patient according to the appropriate standard National Comprehensive Cancer Network and American Cancer Society guidelines, with consideration of their personal and family history risk factors. In this case, the patient will be referred back to their care providers for discussions of  management.   In order to estimate her chance of having a Lynch syndrome mutation, we used statistical models (Premm1,2,6)) and laboratory data that take into account her personal medical history, family history and ancestry.  Because each model is different, there can be a lot of variability in the risks they give.  Therefore, these numbers must be considered a rough range and not a precise risk of having a Lynch syndrome mutation.  These models estimate that she has approximately a 3.2-7.7% chance of having a mutation. Based on this assessment of her family and personal history, genetic testing is recommended.  After considering the risks, benefits, and limitations, the patient provided informed consent for  the following  testing: CancerNext through [employer]   Per the patient's request, we will contact her by telephone to discuss these results. A follow up genetic counseling visit will be scheduled if indicated.  The patient was seen for a total of 60 minutes, greater than 50% of which was spent face-to-face counseling.  This plan is being carried out per Dr. Darrall Dears recommendations.  This note will also be sent to the referring provider via the electronic medical record. The patient will be supplied with a summary of this genetic counseling discussion as well as educational information on the discussed hereditary cancer syndromes following the conclusion of their visit.   Patient was discussed with Dr. Drue Second.   _______________________________________________________________________ For Office Staff:  Number of people involved in session: 2 Was an Intern/ student involved with case: not applicable

## 2011-08-12 NOTE — Progress Notes (Signed)
Mailed after appt letter to pt. 

## 2011-08-18 ENCOUNTER — Other Ambulatory Visit: Payer: Self-pay | Admitting: *Deleted

## 2011-08-18 ENCOUNTER — Telehealth: Payer: Self-pay | Admitting: Oncology

## 2011-08-18 NOTE — Telephone Encounter (Signed)
lmonvm adviisng the pt of her bone density appt at [facility] on 09/01/2011

## 2011-08-24 ENCOUNTER — Telehealth: Payer: Self-pay | Admitting: *Deleted

## 2011-08-24 NOTE — Telephone Encounter (Signed)
Pt called to state she is having tremendous side effects from the arimidex including- severe hot flashes " water is pouring off of me to the point my shoes are sloshing ". Headaches Diarrhea and nausea.  This RN discussed above with pt including symptoms may be transient for several weeks.  Choices discussed including use of other medications for symptom relief.  Plan at present is pt will hold dose tomorrow and monitor side effects and then call this RN on Thursday.

## 2011-08-26 ENCOUNTER — Telehealth: Payer: Self-pay | Admitting: *Deleted

## 2011-08-26 NOTE — Telephone Encounter (Signed)
Per discussion with MD per pt's concerns with medication and noted side effects recommendation is for pt to retry and monitor if symptoms recur and to call.  This RN spoke with pt today and she states post 1 day off of medication " I had very little symptoms- really felt much better "  Discussed above and plan agreed upon to resume the arimidex on 8/19.

## 2011-09-03 ENCOUNTER — Telehealth: Payer: Self-pay | Admitting: Emergency Medicine

## 2011-09-03 ENCOUNTER — Telehealth: Payer: Self-pay | Admitting: Oncology

## 2011-09-03 NOTE — Telephone Encounter (Signed)
S/w the pt and she is aware of the md appt in oct

## 2011-09-03 NOTE — Telephone Encounter (Signed)
Per patient she is taking the Arimidex regularly and states she continues to have hot flashes.  States her headaches are better and so is the nausea.   Patient given a follow up appointment with GM in about 1 month per last office visit note.   Patient instructed to continue the Arimidex and to call with any further concerns in the meantime.

## 2011-09-07 ENCOUNTER — Ambulatory Visit
Admission: RE | Admit: 2011-09-07 | Discharge: 2011-09-07 | Disposition: A | Discharge status: Home / Self Care | Payer: Medicare Other | Source: Ambulatory Visit | Attending: Oncology | Admitting: Oncology

## 2011-09-07 DIAGNOSIS — C50919 Malignant neoplasm of unspecified site of unspecified female breast: Secondary | ICD-10-CM

## 2011-09-20 ENCOUNTER — Telehealth: Payer: Self-pay | Admitting: *Deleted

## 2011-09-21 ENCOUNTER — Telehealth: Payer: Self-pay | Admitting: *Deleted

## 2011-09-21 NOTE — Telephone Encounter (Signed)
Pt called to state she has been having continued side effect on arimidex which are interfering with ADL's.  Ayerim reports : Severe hot flashes " I am dripping wet...water runs down my legs and wets the carpet " Dizziness and vertigo Overall malaise and joint discomfort. Nausea   Per discussion Cadince will stop the arimidex and see Dr Thea Silversmith as scheduled in October. This RN discussed with her need to be off medication for approximately 1 month to initiate a different medication.  Jordyan verbalized understanding and will stop the arimidex.

## 2011-09-29 NOTE — Telephone Encounter (Signed)
No entry 

## 2011-10-04 ENCOUNTER — Ambulatory Visit (INDEPENDENT_AMBULATORY_CARE_PROVIDER_SITE_OTHER): Payer: Medicare Other | Admitting: Internal Medicine

## 2011-10-04 ENCOUNTER — Encounter: Payer: Self-pay | Admitting: Internal Medicine

## 2011-10-04 VITALS — BP 122/74 | HR 64 | Temp 97.8°F | Resp 15 | Wt 257.0 lb

## 2011-10-04 DIAGNOSIS — E039 Hypothyroidism, unspecified: Secondary | ICD-10-CM

## 2011-10-04 DIAGNOSIS — C50919 Malignant neoplasm of unspecified site of unspecified female breast: Secondary | ICD-10-CM

## 2011-10-04 DIAGNOSIS — M1990 Unspecified osteoarthritis, unspecified site: Secondary | ICD-10-CM

## 2011-10-04 DIAGNOSIS — E785 Hyperlipidemia, unspecified: Secondary | ICD-10-CM

## 2011-10-04 NOTE — Assessment & Plan Note (Signed)
Handicap tag renewed for patient today

## 2011-10-04 NOTE — Assessment & Plan Note (Signed)
Never on statin Check lipids now if not done by endo

## 2011-10-04 NOTE — Assessment & Plan Note (Signed)
Interval history reviewed Support offered

## 2011-10-04 NOTE — Assessment & Plan Note (Signed)
Follows with endo in [place] for same The current medical regimen is effective;  continue present plan and medications.  

## 2011-10-04 NOTE — Patient Instructions (Signed)
It was good to see you today. We have reviewed your prior records including labs and tests today Handicap application reviewed today  Test(s) ordered today for cholesterol. Return when you are fasting for labs only. Your results will be called to you after review (48-72hours after test completion). If any changes need to be made, you will be notified at that time. Please schedule followup in 1 year for review, call sooner if problems.

## 2011-10-04 NOTE — Progress Notes (Signed)
  Subjective:    Patient ID: Rachael [name], female    DOB: Jan 03, 1934, 78 y.o.   MRN: [id number]  HPI  Here for follow up - reviewed chronic medical issues:  hypothyroid - follows with endo in [place] for same - recent labs "ok" - no change skin bowels - reports compliance with ongoing medical treatment and no changes in medication dose or frequency.  denies adverse side effects related to current therapy. frustrated with inability to lose weight  dyslipidemia - reports compliance with ongoing medical treatment and no changes in medication dose or frequency. denies adverse side effects related to current therapy.   chronic venous insuff - uses diuretics daily - no hx HTN -   Past Medical History  Diagnosis Date  . Chronic venous insufficiency   . PVD (peripheral vascular disease)     mild carotid 11/05  . Osteoarthritis     bilat hips, severe  . Obesity   . CONTACT DERMATITIS   . GLUCOSE INTOLERANCE   . VITAMIN D DEFICIENCY   . Edema     pt  states had edema of feet and legs has been on lasix per dr. to help  . CAROTID STENOSIS   . DUPUYTREN'S CONTRACTURE   . MENOPAUSAL SYNDROME   . HLD (hyperlipidemia)   . Hypothyroidism     Dr. Cherlyn Roberts in [place] endo   . Breast cancer     R    Review of Systems  Constitutional: Positive for fatigue. Negative for fever.  Cardiovascular: Negative for chest pain.  Gastrointestinal: Negative for nausea, vomiting, diarrhea and constipation.  Genitourinary: Negative for dysuria.       Objective:   Physical Exam  BP 122/74  Pulse 64  Temp 97.8 F (36.6 C) (Oral)  Resp 15  Wt 257 lb (116.574 kg)  SpO2 95% Wt Readings from Last 3 Encounters:  10/04/11 257 lb (116.574 kg)  08/10/11 258 lb 11.2 oz (117.346 kg)  08/06/11 252 lb 12.8 oz (114.669 kg)   Constitutional: She is obese, but appears well-developed and well-nourished. No distress.  Cardiovascular: Normal rate, regular rhythm and normal heart sounds.  No murmur heard.  1+ chronic BLE edema. Pulmonary/Chest: Effort normal and breath sounds normal. No respiratory distress. She has no wheezes.  Neurological: She is alert and oriented to person, place, and time. No cranial nerve deficit. Coordination normal.   Psychiatric: She has a normal mood and affect. Her behavior is normal. Judgment and thought content normal.       Lab Results  Component Value Date   WBC 8.0 08/10/2011   HGB 12.7 08/10/2011   HCT 38.6 08/10/2011   PLT 282 08/10/2011   CHOL 177 06/13/2009   TRIG 233.0* 06/13/2009   HDL 58.90 06/13/2009   LDLDIRECT 95.5 06/13/2009   ALT 16 08/10/2011   AST 19 08/10/2011   NA 140 08/10/2011   K 3.7 08/10/2011   CL 102 08/10/2011   CREATININE 1.00 08/10/2011   BUN 25* 08/10/2011   CO2 28 08/10/2011   TSH 0.55 11/06/2009   INR 0.98 08/13/2010   HGBA1C 6.0 03/29/2008     Assessment & Plan:  See problem list. Medications and labs reviewed today.

## 2011-10-19 ENCOUNTER — Ambulatory Visit (HOSPITAL_BASED_OUTPATIENT_CLINIC_OR_DEPARTMENT_OTHER): Payer: Medicare Other | Admitting: Oncology

## 2011-10-19 ENCOUNTER — Telehealth: Payer: Self-pay | Admitting: Oncology

## 2011-10-19 VITALS — BP 156/73 | HR 84 | Temp 98.5°F | Resp 20 | Ht 68.0 in | Wt 261.5 lb

## 2011-10-19 DIAGNOSIS — Z170 Estrogen receptor positive status [ER+]: Secondary | ICD-10-CM

## 2011-10-19 DIAGNOSIS — C50919 Malignant neoplasm of unspecified site of unspecified female breast: Secondary | ICD-10-CM

## 2011-10-19 DIAGNOSIS — C50319 Malignant neoplasm of lower-inner quadrant of unspecified female breast: Secondary | ICD-10-CM

## 2011-10-19 MED ORDER — EXEMESTANE 25 MG PO TABS: 25.0000 mg | ORAL_TABLET | Freq: Every day | ORAL | Status: DC

## 2011-10-19 NOTE — Progress Notes (Signed)
ID: [name]   DOB: 1933/10/03  MR#: [id number]  FAO#:[id number]  HISTORY OF PRESENT ILLNESS:  Cordelia had routine screening mammography at [facility] 05/06/2011, showing an area of focal asymmetry in the lower inner quadrant of the right breast. Additional views 05/10/2011 confirmed a 9 mm area of irregularity without associated microcalcifications. Ultrasound of the right breast confirmed a 9 mm hypoechoic mass with posterior acoustic shadowing. Biopsy of this mass was performed the next day, and showed ([id number]) and invasive lobular carcinoma, E-cadherin negative, which was estrogen receptor positive at 97%, and progesterone receptor positive at 81%. There was no HER-2 amplification, and MIB-1 was 12%.  On may 07/31/2011 him of the patient underwent breast MRI, which showed a 1.5 cm mildly irregular enhancing mass in the upper inner quadrant of the right breast. There were no other masses of concern and no abnormal appearing lymph nodes. Accordingly, on 06/16/2011 the patient underwent right lumpectomy and sentinel lymph node sampling under Dr. Felicity Pellegrini, the final pathology ([id number]) showing an invasive lobular carcinoma, grade 1, measuring 1.5 cm, with both sentinel lymph nodes negative. The patient's subsequent history is as detailed below.  INTERVAL HISTORY: Niala returns today for followup of her breast cancer. Since her last visit here, she started tamoxifen. She had a terrible time with that. She noted only had hot flashes, which she describes as unbearable, nausea, vomiting, diarrhea, headaches, fatigue, and increased weight. She stopped the medication and very quickly those symptoms abated. She is here today to discuss alternatives.  REVIEW OF SYSTEMS: She still describes herself is moderately fatigued. Her ankles swell at times. She thinks she may have been exposed to some poison ivy fumes, were someone was burning some leaves in a nearby backyard. She has frequent headaches. Otherwise  a detailed review of systems today was noncontributory.  PAST MEDICAL HISTORY: Past Medical History  Diagnosis Date  . Chronic venous insufficiency   . PVD (peripheral vascular disease)     mild carotid 11/05  . Osteoarthritis     bilat hips, severe  . Obesity   . CONTACT DERMATITIS   . GLUCOSE INTOLERANCE   . VITAMIN D DEFICIENCY   . Edema     pt  states had edema of feet and legs has been on lasix per dr. to help  . CAROTID STENOSIS   . DUPUYTREN'S CONTRACTURE   . MENOPAUSAL SYNDROME   . HLD (hyperlipidemia)   . Hypothyroidism     Dr. Cherlyn Roberts in [place] endo   . Breast cancer     R    PAST SURGICAL HISTORY: Past Surgical History  Procedure Date  . 2d echo 11/29/03    normal LV size. normal EF   . Normal caronary arteries     per pt. 2003  . Cholecystectomy   . Inguinal hernia repair   . Tonsillectomy   . Right rotator cuff surgery   . Total hip arthroplasty 05/2008 and 10/2008    B hip - alusio  . Hernia repair     incisional hernia  . Breast biopsy 05/11/11    right breast  . Breast lumpectomy     right breast  . Back surgery 1983    lumbarectomy  . Finger surgery     FAMILY HISTORY Family History  Problem Relation Age of Onset  . Coronary artery disease      family hx - female 1st degree relative <60  . Hypothyroidism      aunt   .  Uterine cancer Mother 82  . Breast cancer Daughter 8    BRCA negative (no BART)  . Pancreatic cancer Maternal Grandfather     diagnosed in his 26s  . Anesthesia problems Neg Hx   . Prostate cancer Maternal Uncle     diagnosed in his 109s  . Colon cancer Cousin   . Prostate cancer Paternal Uncle     diagnosed in his 109s  . Clotting disorder Maternal Grandmother   . Uterine cancer Cousin   . Leukemia Cousin   . Prostate cancer Cousin    the patient's father died at the age of 40 from heart disease. The patient's mother is alive at age 43 as of July of 2013. She had uterine cancer in her 33s. There is a significant  cancer family on her side, to be detailed when the patient meets with her genetics counselor. The patient herself has no sisters, does have 3 brothers. One of the patient's daughters, Margarite Gouge, was diagnosed with breast cancer at the age of 30. She was tested for the BRCA gene and was negative. There is no other breast or ovarian cancer in the immediate family to her knowledge.  GYNECOLOGIC HISTORY: Menarche age 51, menopause age 3. The patient tried Prempro briefly but did not like it. She is GX P5, first live birth age 60.  SOCIAL HISTORY: Zelena worked in [employer], currently is developing a juice plus business. She is divorced and lives by herself. 3 of her 5 children live in town ([place], [place], and [place]) and 2 live in [place] and [place]). She has 7 grandchildren. She attends the [employer] [address].   ADVANCED DIRECTIVES: Not in place  HEALTH MAINTENANCE: History  Substance Use Topics  . Smoking status: Never Smoker   . Smokeless tobacco: Never Used   Comment: Divorced, lives with 36 yo mother  . Alcohol Use: No     Colonoscopy: 2009  PAP: Grewal  [facility]  Lipid panel: Lebscher  Allergies  Allergen Reactions  . Shellfish Allergy Anaphylaxis and Hives    Hives all over the body.  . Percocet (Oxycodone-Acetaminophen) Hives  . Iodine Hives    Current Outpatient Prescriptions  Medication Sig Dispense Refill  . cholecalciferol (VITAMIN D) 1000 UNITS tablet Take 2,000 Units by mouth daily.      . Cyanocobalamin (B-12 PO) Take 1 tablet by mouth daily.       Marland Kitchen levothyroxine (SYNTHROID, LEVOTHROID) 137 MCG tablet Take 137 mcg by mouth daily.       . Nutritional Supplements (JUICE PLUS FIBRE) LIQD Take 4 capsules by mouth 3 (three) times daily. 4 vegetable capsules with breakfast, 4 fruit capsules with lunch and 4 berry capsules with dinner.      . Omega-3 350 MG CAPS Take 2 capsules by mouth daily.        Marland Kitchen  triamterene-hydrochlorothiazide (DYAZIDE) 37.5-25 MG per capsule Take 1 capsule by mouth every morning.        OBJECTIVE: Middle-aged white woman who appears well Filed Vitals:   10/19/11 1532  BP: 156/73  Pulse: 84  Temp: 98.5 F (36.9 C)  Resp: 20     Body mass index is 39.76 kg/(m^2).    ECOG FS: 0  Sclerae unicteric Oropharynx clear No cervical or supraclavicular adenopathy Lungs no rales or rhonchi Heart regular rate and rhythm Abd benign MSK no focal spinal tenderness, no peripheral edema Neuro: nonfocal Breasts: The right breast is status post  lumpectomy. There is no evidence of disease recurrence The right axilla is benign to palpation. The left breast and left axilla are unremarkable.  LAB RESULTS: Lab Results  Component Value Date   WBC 8.0 08/10/2011   NEUTROABS 4.8 08/10/2011   HGB 12.7 08/10/2011   HCT 38.6 08/10/2011   MCV 89.1 08/10/2011   PLT 282 08/10/2011      Chemistry      Component Value Date/Time   NA 140 08/10/2011 1556   K 3.7 08/10/2011 1556   CL 102 08/10/2011 1556   CO2 28 08/10/2011 1556   BUN 25* 08/10/2011 1556   CREATININE 1.00 08/10/2011 1556      Component Value Date/Time   CALCIUM 9.7 08/10/2011 1556   ALKPHOS 75 08/10/2011 1556   AST 19 08/10/2011 1556   ALT 16 08/10/2011 1556   BILITOT 0.7 08/10/2011 1556       Lab Results  Component Value Date   LABCA2 33 08/10/2011    No components found with this basename: [id number]    No results found for this basename: INR:1;PROTIME:1 in the last 168 hours  Urinalysis    Component Value Date/Time   COLORURINE LT. YELLOW 08/28/2010 1548   APPEARANCEUR CLEAR 08/28/2010 1548   LABSPEC 1.025 08/28/2010 1548   PHURINE 6.0 08/28/2010 1548   GLUCOSEU NEGATIVE 08/07/2010 1633   HGBUR TRACE-LYSED 08/28/2010 1548   BILIRUBINUR NEGATIVE 08/28/2010 1548   KETONESUR NEGATIVE 08/28/2010 1548   PROTEINUR NEGATIVE 08/07/2010 1633   UROBILINOGEN 0.2 08/28/2010 1548   NITRITE NEGATIVE 08/28/2010 1548    LEUKOCYTESUR TRACE 08/28/2010 1548    STUDIES: No results found.  ASSESSMENT: 78 y.o. [place] woman s/p Right lumpectomy and sentinel lymph node sampling 06/16/2011 for a T1c N0 invasive lobular breast cancer, grade 1, strongly estrogen and progesterone positive, with no HER-2 amplification, and an Mib-1 of 12%  (1) started and anastrozole July 2013, discontinued because of multiple side effects.  (2) starting exemestane October 2013  PLAN: I reviewed Carroll's proptosis with her, as well as her treatment options. She understands that she cannot tolerate anti-estrogens, she will need postlumpectomy radiation. She would also have a 17% risk of disease recurrence versus a 9% risk of disease recurrence with anti-estrogens.  All this is very motivated to her. The choice we had was between exemestane and tamoxifen. We decided to try exemestane, since it is in a different category of aromatase inhibitor than anastrozole, and since her recent bone density was normal. Assuming she tolerates the exemestane well, she will see me again in January. If she cannot tolerate it however I think it would be best for her to proceed to radiation instead of waiting another 2 months or so of for a trial of tamoxifen.  Ediberto Sens C    10/19/2011

## 2011-10-19 NOTE — Telephone Encounter (Signed)
gve the pt her jan 2014 appt calendar °

## 2011-10-26 ENCOUNTER — Other Ambulatory Visit: Payer: Self-pay | Admitting: *Deleted

## 2011-10-26 MED ORDER — LETROZOLE 2.5 MG PO TABS: 2.5000 mg | ORAL_TABLET | Freq: Every day | ORAL | Status: DC

## 2011-10-26 NOTE — Telephone Encounter (Signed)
Pt called to this RN to state cost of aromasin is too much ( $ 140- 400 a month ).  Per MD review recommended switch to letrozole. Obtained cost per pt's pharmacy compared to [facility] pharmacy.  Discussed with pt and prescription e scribed.

## 2011-10-27 ENCOUNTER — Encounter (INDEPENDENT_AMBULATORY_CARE_PROVIDER_SITE_OTHER): Payer: Medicare Other | Admitting: General Surgery

## 2011-10-29 ENCOUNTER — Telehealth: Payer: Self-pay | Admitting: *Deleted

## 2011-10-29 NOTE — Telephone Encounter (Signed)
Rachael [name] called to this RN to state concerns due to noted side effects of new prescription letrozole that she picked up this week at pharmacy " there are like 2 pages of side effects including stroke and heart attack". " it seems like there were only 10 with the anastrazole .. Maybe I should retry that one ".  Skylie reviewed side effects noted for letrozole of concern besides above as feet and leg swelling " which I already take a fluid pill ", hair loss, and nausea.  Per discussion pt's statistics relating to recurrence with no AI reviewed with noted benefit with use -  Krissy verbalized understanding and decided she would prefer to resume anastrazole every other day as discussed with MD per starting letrozole to see how she tolerates.  No other needs at this time.  This note will be given to MD for review.

## 2011-11-02 ENCOUNTER — Telehealth: Payer: Self-pay

## 2011-11-02 DIAGNOSIS — E739 Lactose intolerance, unspecified: Secondary | ICD-10-CM

## 2011-11-02 DIAGNOSIS — E559 Vitamin D deficiency, unspecified: Secondary | ICD-10-CM

## 2011-11-02 DIAGNOSIS — R739 Hyperglycemia, unspecified: Secondary | ICD-10-CM

## 2011-11-02 NOTE — Assessment & Plan Note (Signed)
Will check a1c now, consider med therapy as needed Lab Results  Component Value Date   HGBA1C 6.0 03/29/2008

## 2011-11-02 NOTE — Telephone Encounter (Signed)
Pt advised of lab request

## 2011-11-02 NOTE — Telephone Encounter (Signed)
Please forward this to dr Felicity Coyer, pcp

## 2011-11-02 NOTE — Telephone Encounter (Signed)
Orders for A1c and vitamin D placed into EMR

## 2011-11-02 NOTE — Telephone Encounter (Signed)
Pt is requesting labs to check A1C and Vit D levels, please advise

## 2011-11-02 NOTE — Assessment & Plan Note (Signed)
Check vitamin D now at patient request

## 2011-11-03 ENCOUNTER — Other Ambulatory Visit (INDEPENDENT_AMBULATORY_CARE_PROVIDER_SITE_OTHER): Payer: Medicare Other

## 2011-11-03 DIAGNOSIS — E739 Lactose intolerance, unspecified: Secondary | ICD-10-CM

## 2011-11-03 DIAGNOSIS — R739 Hyperglycemia, unspecified: Secondary | ICD-10-CM

## 2011-11-03 DIAGNOSIS — R7309 Other abnormal glucose: Secondary | ICD-10-CM

## 2011-11-03 DIAGNOSIS — E785 Hyperlipidemia, unspecified: Secondary | ICD-10-CM

## 2011-11-03 DIAGNOSIS — E559 Vitamin D deficiency, unspecified: Secondary | ICD-10-CM

## 2011-11-03 LAB — LDL CHOLESTEROL, DIRECT: Direct LDL: 115.8 mg/dL

## 2011-11-03 LAB — LIPID PANEL: Cholesterol: 180 mg/dL (ref 0–200)

## 2011-11-04 LAB — VITAMIN D 25 HYDROXY (VIT D DEFICIENCY, FRACTURES): Vit D, 25-Hydroxy: 35 ng/mL (ref 30–89)

## 2011-11-08 ENCOUNTER — Encounter (INDEPENDENT_AMBULATORY_CARE_PROVIDER_SITE_OTHER): Payer: Medicare Other | Admitting: General Surgery

## 2011-11-18 ENCOUNTER — Encounter (INDEPENDENT_AMBULATORY_CARE_PROVIDER_SITE_OTHER): Payer: Self-pay | Admitting: General Surgery

## 2011-11-18 ENCOUNTER — Ambulatory Visit (INDEPENDENT_AMBULATORY_CARE_PROVIDER_SITE_OTHER): Payer: Medicare Other | Admitting: General Surgery

## 2011-11-18 VITALS — BP 138/70 | HR 72 | Temp 97.9°F | Resp 16 | Ht 69.0 in | Wt 254.8 lb

## 2011-11-18 DIAGNOSIS — C50919 Malignant neoplasm of unspecified site of unspecified female breast: Secondary | ICD-10-CM

## 2011-11-18 NOTE — Progress Notes (Signed)
Subjective:     Patient ID: Rachael [name], female   DOB: May 24, 1933, 78 y.o.   MRN: [id number]  HPI The patient is a 78 year old white female who is 5 months status post right breast lumpectomy and negative sentinel node biopsy for a T1 C. N0 right breast cancer. She did not receive radiation therapy. She started taking Femara but had a lot of side effects so she quit this medicine. She is now doing juice plus and a vegetarian diet and is feeling well.she does still complain of some soreness in the left forearm which she attributes to some of the anesthetic medicine she was given during surgery  Review of Systems  Constitutional: Negative.   HENT: Negative.   Eyes: Negative.   Respiratory: Negative.   Cardiovascular: Negative.   Gastrointestinal: Negative.   Genitourinary: Negative.   Musculoskeletal: Positive for myalgias.  Skin: Negative.   Neurological: Negative.   Hematological: Negative.   Psychiatric/Behavioral: Negative.        Objective:   Physical Exam  Constitutional: She is oriented to person, place, and time. She appears well-developed and well-nourished.  HENT:  Head: Normocephalic and atraumatic.  Eyes: Conjunctivae normal and EOM are normal. Pupils are equal, round, and reactive to light.  Neck: Normal range of motion. Neck supple.  Cardiovascular: Normal rate, regular rhythm and normal heart sounds.   Pulmonary/Chest: Effort normal and breath sounds normal.       Her right breast incision has healed nicely. There is no palpable mass in either breast. No palpable axillary or supraclavicular cervical lymphadenopathy.  Abdominal: Soft. Bowel sounds are normal. She exhibits no mass. There is no tenderness.  Musculoskeletal: Normal range of motion.       I do not palpate any abnormality in the left forearm  Lymphadenopathy:    She has no cervical adenopathy.  Neurological: She is alert and oriented to person, place, and time.  Skin: Skin is warm and dry.    Psychiatric: She has a normal mood and affect. Her behavior is normal.       Assessment:     5 months status post right lumpectomy and negative sentinel node biopsy. At this point she has quit the antiestrogen therapy and did not receive any radiation therapy.    Plan:     At this point she will continue to do regular self exams. She may need to see a neurologist for the left arm pain but we will talk with her oncologist about this. I will plan to see her back in 3 months.

## 2011-11-18 NOTE — Patient Instructions (Signed)
Continue regular self exams  

## 2011-11-24 ENCOUNTER — Telehealth (INDEPENDENT_AMBULATORY_CARE_PROVIDER_SITE_OTHER): Payer: Self-pay | Admitting: General Surgery

## 2011-11-24 NOTE — Telephone Encounter (Signed)
Message copied by Littie Deeds on Wed Nov 24, 2011  1:23 PM ------      Message from: Cathi Roan      Created: Wed Nov 24, 2011  8:43 AM      Regarding: Cancer Drug      Contact: [phone]       Please call wanted Dr. Carolynne Edouard to know she is going back on medication.

## 2011-11-24 NOTE — Telephone Encounter (Signed)
Returned pt's call and she informed me that she has gone back on her anastrozole after her appt w/ Dr. Carolynne Edouard.

## 2011-11-25 ENCOUNTER — Other Ambulatory Visit: Payer: Self-pay

## 2011-11-25 MED ORDER — TRIAMTERENE-HCTZ 37.5-25 MG PO CAPS: 1.0000 | ORAL_CAPSULE | ORAL | Status: DC

## 2011-11-25 NOTE — Telephone Encounter (Signed)
..   Requested Prescriptions   Signed Prescriptions Disp Refills  . triamterene-hydrochlorothiazide (DYAZIDE) 37.5-25 MG per capsule 30 capsule 6    Sig: Take 1 each (1 capsule total) by mouth every morning.    Authorizing Provider: HOCHREIN, JAMES    Ordering User: Crisanto Nied M   

## 2011-12-07 ENCOUNTER — Encounter: Payer: Self-pay | Admitting: *Deleted

## 2011-12-07 ENCOUNTER — Telehealth: Payer: Self-pay | Admitting: Genetic Counselor

## 2011-12-07 ENCOUNTER — Encounter: Payer: Self-pay | Admitting: Genetic Counselor

## 2011-12-07 ENCOUNTER — Telehealth: Payer: Self-pay | Admitting: *Deleted

## 2011-12-07 NOTE — Telephone Encounter (Signed)
This RN spoke with pt per her call -   Rachael [name] states post visit with Dr Carolynne Edouard and his concern with her not being on AI as well as she did not receive radiation ( " he said I had a 40% recurrence factor ) and then she was watching TV late at night " and this doctor was treating pt that had a 40% recurrence factor - I decided to resume the first pill he gave me at every day for a week and now I am taking it every day ".  She is having minimal hot flashes  ( "3" ). No headaches which was one reason why she stopped the medication.  Rachael [name] was contacted by our genetic counselor and Rachael [name] would like more information on " Lynch syndrome ".  This RN will inquire with Clydie Braun and return call to pt.

## 2011-12-07 NOTE — Telephone Encounter (Signed)
This RN attempted to contact per her call last week with request for a return call.  Message left on both home and mobile phone numbers requesting a return call to discuss her concerns.

## 2011-12-07 NOTE — Telephone Encounter (Signed)
Revealed MSH2 VUS.  Discussed Lynch syndrome briefly and suggested family studies to help with clarifying the variant.  She agreed and provided information for the family study application.

## 2011-12-13 ENCOUNTER — Other Ambulatory Visit: Payer: Self-pay | Admitting: Oncology

## 2012-01-17 ENCOUNTER — Telehealth: Payer: Self-pay | Admitting: Cardiology

## 2012-01-17 ENCOUNTER — Other Ambulatory Visit: Payer: Self-pay | Admitting: *Deleted

## 2012-01-17 NOTE — Telephone Encounter (Signed)
New Problem:    Patient called in needing 90 day prescription for her triamterene-hydrochlorothiazide (DYAZIDE) 37.5-25 MG per capsule written out, along with her demographic info, and sent into [facility] fax-[phone].

## 2012-01-18 ENCOUNTER — Telehealth: Payer: Self-pay | Admitting: Genetic Counselor

## 2012-01-18 ENCOUNTER — Other Ambulatory Visit: Payer: Self-pay

## 2012-01-18 MED ORDER — TRIAMTERENE-HCTZ 37.5-25 MG PO CAPS: 1.0000 | ORAL_CAPSULE | ORAL | Status: DC

## 2012-01-18 NOTE — Telephone Encounter (Signed)
..   Requested Prescriptions   Signed Prescriptions Disp Refills  . triamterene-hydrochlorothiazide (DYAZIDE) 37.5-25 MG per capsule 30 capsule 6    Sig: Take 1 each (1 capsule total) by mouth every morning.    Authorizing Provider: Rollene Rotunda    Ordering User: Christella Hartigan, Breanne Olvera Judie Petit

## 2012-01-18 NOTE — Telephone Encounter (Signed)
Revealed approval for family studies for her MSH2 VUS.  She will call her brother who lives near her mother in [place] to discuss logistics.

## 2012-01-20 ENCOUNTER — Ambulatory Visit: Payer: Medicare Other | Admitting: Physician Assistant

## 2012-01-20 NOTE — Progress Notes (Signed)
This patient contacted our schedulers today and wanted to cancel her appointment with me this afternoon. Our nurse, Rachael Ravens, RN has spoken with the patient today, and per Rachael [name]'s report, the patient is scheduled to see her surgeon next month, and would like to delay her appointment with [place] since she has been doing so well.  The patient is tolerating the exemestane quite well and will continue as planned. She will reschedule her appointment to see [place] in 3-6 months, depending on her followup appointments with her surgeon.  [name], PA-C 01/30/2012

## 2012-01-21 ENCOUNTER — Telehealth: Payer: Self-pay | Admitting: Oncology

## 2012-01-21 NOTE — Telephone Encounter (Signed)
Per pof 01/20/12, pt need a f/u in June with labs 1 wk prior. Lvm advising lab appt 6/10 @ 2.45 and ml appt 6/17 @ 3.15pm. I will also send pt an appt calendar by mail.

## 2012-01-25 NOTE — Telephone Encounter (Signed)
Follow-up:     Patient called in needing her prescription sent to [facility].  Please see previous message.  Please call back.

## 2012-01-26 ENCOUNTER — Other Ambulatory Visit: Payer: Self-pay | Admitting: *Deleted

## 2012-01-26 MED ORDER — TRIAMTERENE-HCTZ 37.5-25 MG PO CAPS: 1.0000 | ORAL_CAPSULE | ORAL | Status: DC

## 2012-01-26 NOTE — Telephone Encounter (Signed)
FOLLOW-UP phone call- LMOM for pt.

## 2012-02-23 ENCOUNTER — Ambulatory Visit (INDEPENDENT_AMBULATORY_CARE_PROVIDER_SITE_OTHER): Payer: Medicare Other | Admitting: General Surgery

## 2012-04-14 ENCOUNTER — Ambulatory Visit (INDEPENDENT_AMBULATORY_CARE_PROVIDER_SITE_OTHER): Payer: Medicare Other | Admitting: General Surgery

## 2012-04-26 ENCOUNTER — Telehealth: Payer: Self-pay | Admitting: *Deleted

## 2012-04-26 NOTE — Telephone Encounter (Signed)
sw pt informed her to come @ 3:30 instead of 3:15pm to see Dr. Gayland Curry. Pt is aware...td

## 2012-04-27 ENCOUNTER — Other Ambulatory Visit (INDEPENDENT_AMBULATORY_CARE_PROVIDER_SITE_OTHER): Payer: Self-pay | Admitting: General Surgery

## 2012-05-01 ENCOUNTER — Ambulatory Visit (INDEPENDENT_AMBULATORY_CARE_PROVIDER_SITE_OTHER): Payer: Medicare Other | Admitting: General Surgery

## 2012-05-01 ENCOUNTER — Encounter (INDEPENDENT_AMBULATORY_CARE_PROVIDER_SITE_OTHER): Payer: Self-pay | Admitting: General Surgery

## 2012-05-01 ENCOUNTER — Encounter (INDEPENDENT_AMBULATORY_CARE_PROVIDER_SITE_OTHER): Payer: Self-pay

## 2012-05-01 VITALS — BP 150/72 | HR 69 | Temp 97.7°F | Resp 18 | Ht 69.0 in | Wt 259.6 lb

## 2012-05-01 DIAGNOSIS — C50911 Malignant neoplasm of unspecified site of right female breast: Secondary | ICD-10-CM

## 2012-05-01 DIAGNOSIS — C50919 Malignant neoplasm of unspecified site of unspecified female breast: Secondary | ICD-10-CM

## 2012-05-01 NOTE — Patient Instructions (Signed)
Continue regular self exams  

## 2012-05-01 NOTE — Progress Notes (Signed)
Subjective:     Patient ID: Rachael [name], female   DOB: [birth year], 79 y.o.   MRN: [id number]  HPI The patient is a 79 year old white female who is 10 months status post right lumpectomy and negative sentinel node biopsy for a T1 C. N0 right breast cancer. She did not receive any radiation therapy. She could not tolerate the antiestrogen therapy. Since her last visit her mother was put in hospice and she has been out in [place] helping to take care of her. She denies any breast pain. She denies any discharge from her nipple. Her only other complaint is of some bulging on her right abdomen at the site of her previous surgery. She states that she would like to switch to the breast center for her yearly mammogram.  Review of Systems  Constitutional: Negative.   HENT: Negative.   Eyes: Negative.   Respiratory: Negative.   Cardiovascular: Negative.   Gastrointestinal: Negative.   Endocrine: Negative.   Genitourinary: Negative.   Musculoskeletal: Positive for joint swelling.  Skin: Positive for rash.  Allergic/Immunologic: Negative.   Neurological: Negative.   Hematological: Negative.   Psychiatric/Behavioral: Negative.        Objective:   Physical Exam  Constitutional: She is oriented to person, place, and time. She appears well-developed and well-nourished.  HENT:  Head: Normocephalic and atraumatic.  Eyes: Conjunctivae and EOM are normal. Pupils are equal, round, and reactive to light.  Neck: Normal range of motion. Neck supple.  Cardiovascular: Normal rate, regular rhythm and normal heart sounds.   Pulmonary/Chest: Effort normal and breath sounds normal.  There is no palpable mass in either breast. There is no palpable axillary supraclavicular or cervical lymphadenopathy. She does have a small raw area of the skin on her left inferior breast but there is no mass associated with this  Abdominal: Soft. Bowel sounds are normal. She exhibits no mass. There is no tenderness.  There is  no obvious palpable fascial defect at the site of bulging on the right midabdomen.  Musculoskeletal: Normal range of motion.  Lymphadenopathy:    She has no cervical adenopathy.  Neurological: She is alert and oriented to person, place, and time.  Skin: Skin is warm and dry.  Psychiatric: She has a normal mood and affect. Her behavior is normal.       Assessment:     The patient is 10 months status post right lumpectomy for breast cancer.     Plan:     At this point she will continue to do regular self exams. We will put in order in for her mammogram to be done at the breast center. I believe the only way to evaluate the bulging of the right abdomen would be to get a CT scan but she is not scheduled this. If her mammogram is negative then we will plan to see her in about 6 months

## 2012-06-01 ENCOUNTER — Telehealth (INDEPENDENT_AMBULATORY_CARE_PROVIDER_SITE_OTHER): Payer: Self-pay | Admitting: General Surgery

## 2012-06-01 ENCOUNTER — Other Ambulatory Visit (INDEPENDENT_AMBULATORY_CARE_PROVIDER_SITE_OTHER): Payer: Self-pay | Admitting: General Surgery

## 2012-06-01 DIAGNOSIS — K430 Incisional hernia with obstruction, without gangrene: Secondary | ICD-10-CM

## 2012-06-01 NOTE — Telephone Encounter (Signed)
I spoke with this patient she is calling to ask for a Ct Abd Pelvis  She states Dr Carolynne Edouard thought this might be needed for the area of what was thought to be a hernia . This was sent to Dr Carolynne Edouard and Marcelino Duster

## 2012-06-06 ENCOUNTER — Telehealth (INDEPENDENT_AMBULATORY_CARE_PROVIDER_SITE_OTHER): Payer: Self-pay | Admitting: General Surgery

## 2012-06-06 NOTE — Telephone Encounter (Signed)
Spoke with patient she is aware of appt on 06/08/12 at noon  [address]  She will have a bun and creatine drawn and will pick up contrast  Today  At the site

## 2012-06-08 ENCOUNTER — Other Ambulatory Visit: Payer: Medicare Other

## 2012-06-08 ENCOUNTER — Ambulatory Visit
Admission: RE | Admit: 2012-06-08 | Discharge: 2012-06-08 | Disposition: A | Discharge status: Home / Self Care | Payer: Medicare Other | Source: Ambulatory Visit | Attending: General Surgery | Admitting: General Surgery

## 2012-06-08 DIAGNOSIS — K430 Incisional hernia with obstruction, without gangrene: Secondary | ICD-10-CM

## 2012-06-12 ENCOUNTER — Ambulatory Visit
Admission: RE | Admit: 2012-06-12 | Discharge: 2012-06-12 | Disposition: A | Discharge status: Home / Self Care | Payer: Medicare Other | Source: Ambulatory Visit | Attending: General Surgery | Admitting: General Surgery

## 2012-06-12 DIAGNOSIS — C50911 Malignant neoplasm of unspecified site of right female breast: Secondary | ICD-10-CM

## 2012-06-13 ENCOUNTER — Ambulatory Visit
Admission: RE | Admit: 2012-06-13 | Discharge: 2012-06-13 | Disposition: A | Discharge status: Home / Self Care | Payer: Medicare Other | Source: Ambulatory Visit | Attending: General Surgery | Admitting: General Surgery

## 2012-06-13 MED ORDER — IOHEXOL 300 MG/ML  SOLN: 125.0000 mL | Freq: Once | INTRAMUSCULAR | Status: AC | PRN

## 2012-06-14 ENCOUNTER — Telehealth (INDEPENDENT_AMBULATORY_CARE_PROVIDER_SITE_OTHER): Payer: Self-pay

## 2012-06-14 NOTE — Telephone Encounter (Signed)
Patient calling in for CT results.  Patient advised that Dr. Carolynne Edouard will review and we will contact patient with results.  Patient would like to be contacted on her mobile number [phone].

## 2012-06-15 NOTE — Telephone Encounter (Signed)
I called patient and let her know CT was negative for hernia.

## 2012-06-15 NOTE — Telephone Encounter (Signed)
There is no evidence of recurrent hernia on CT

## 2012-06-20 ENCOUNTER — Ambulatory Visit (INDEPENDENT_AMBULATORY_CARE_PROVIDER_SITE_OTHER): Payer: Medicare Other | Admitting: Cardiology

## 2012-06-20 ENCOUNTER — Encounter: Payer: Self-pay | Admitting: Cardiology

## 2012-06-20 ENCOUNTER — Other Ambulatory Visit (HOSPITAL_BASED_OUTPATIENT_CLINIC_OR_DEPARTMENT_OTHER): Payer: Medicare Other | Admitting: Lab

## 2012-06-20 VITALS — BP 125/70 | HR 63 | Ht 69.0 in | Wt 242.8 lb

## 2012-06-20 DIAGNOSIS — C50919 Malignant neoplasm of unspecified site of unspecified female breast: Secondary | ICD-10-CM

## 2012-06-20 DIAGNOSIS — I739 Peripheral vascular disease, unspecified: Secondary | ICD-10-CM

## 2012-06-20 DIAGNOSIS — E559 Vitamin D deficiency, unspecified: Secondary | ICD-10-CM

## 2012-06-20 LAB — CBC WITH DIFFERENTIAL/PLATELET
Basophils Absolute: 0.1 10*3/uL (ref 0.0–0.1)
Eosinophils Absolute: 0.2 10*3/uL (ref 0.0–0.5)
HCT: 39.4 % (ref 34.8–46.6)
HGB: 13.6 g/dL (ref 11.6–15.9)
LYMPH%: 17.5 % (ref 14.0–49.7)
MCV: 86.7 fL (ref 79.5–101.0)
MONO#: 1.5 10*3/uL — ABNORMAL HIGH (ref 0.1–0.9)
MONO%: 11.1 % (ref 0.0–14.0)
NEUT#: 9.5 10*3/uL — ABNORMAL HIGH (ref 1.5–6.5)
NEUT%: 69.5 % (ref 38.4–76.8)
Platelets: 296 10*3/uL (ref 145–400)
WBC: 13.7 10*3/uL — ABNORMAL HIGH (ref 3.9–10.3)

## 2012-06-20 LAB — COMPREHENSIVE METABOLIC PANEL (CC13)
BUN: 17.9 mg/dL (ref 7.0–26.0)
CO2: 26 mEq/L (ref 22–29)
Creatinine: 1.1 mg/dL (ref 0.6–1.1)
Glucose: 107 mg/dl — ABNORMAL HIGH (ref 70–99)
Sodium: 140 mEq/L (ref 136–145)
Total Bilirubin: 1 mg/dL (ref 0.20–1.20)
Total Protein: 6.8 g/dL (ref 6.4–8.3)

## 2012-06-20 NOTE — Patient Instructions (Addendum)
The current medical regimen is effective;  continue present plan and medications.  Follow up in 1 year with Dr Hochrein.  You will receive a letter in the mail 2 months before you are due.  Please call us when you receive this letter to schedule your follow up appointment.  

## 2012-06-20 NOTE — Progress Notes (Signed)
HPI The patient presents for followup of hypertension and lower extremity swelling. Since I last saw her she has had lobectomy for breast cancer. She's doing well with this. She's actually continued to lose weight and is using a product called Juice Plus with significant success. She's lost about 20 pounds. With this her lower extremity swelling which was her big complaint is improved. She's been more active and was able to do a four-hour hike recently without limitations. She denies chest pressure, neck or arm discomfort. She's had no palpitations, presyncope or syncope. She denies any PND or orthopnea.  Allergies  Allergen Reactions  . Shellfish Allergy Anaphylaxis and Hives    Hives all over the body.  . Percocet (Oxycodone-Acetaminophen) Hives  . Iodinated Diagnostic Agents     PT IS NOT AWARE OF IODINE ALLERGY, PREMEDICATED FOR PRIOR PREMEDS ONLY//A.C.  . Iodine Hives    Current Outpatient Prescriptions  Medication Sig Dispense Refill  . anastrozole (ARIMIDEX) 1 MG tablet Take 1 mg by mouth daily.      . cholecalciferol (VITAMIN D) 1000 UNITS tablet Take 2,000 Units by mouth daily.      . Cyanocobalamin (B-12 PO) Take 1 tablet by mouth daily.       Marland Kitchen levothyroxine (SYNTHROID, LEVOTHROID) 137 MCG tablet Take 137 mcg by mouth daily.       . Nutritional Supplements (JUICE PLUS FIBRE) LIQD Take 4 capsules by mouth 3 (three) times daily. 4 vegetable capsules with breakfast, 4 fruit capsules with lunch and 4 berry capsules with dinner.      . Omega-3 350 MG CAPS Take 2 capsules by mouth daily.        Marland Kitchen triamcinolone cream (KENALOG) 0.1 % Apply 1 application topically 2 (two) times daily.      Marland Kitchen triamterene-hydrochlorothiazide (DYAZIDE) 37.5-25 MG per capsule Take 1 each (1 capsule total) by mouth every morning.  90 capsule  2   No current facility-administered medications for this visit.    Past Medical History  Diagnosis Date  . Chronic venous insufficiency   . PVD (peripheral  vascular disease)     mild carotid 11/05  . Osteoarthritis     bilat hips, severe  . Obesity   . CONTACT DERMATITIS   . GLUCOSE INTOLERANCE   . VITAMIN D DEFICIENCY   . Edema     pt  states had edema of feet and legs has been on lasix per dr. to help  . CAROTID STENOSIS   . DUPUYTREN'S CONTRACTURE   . MENOPAUSAL SYNDROME   . HLD (hyperlipidemia)   . Hypothyroidism     Dr. Cherlyn Roberts in [place] end[facility]   . Breast cancer     R    Past Surgical History  Procedure Laterality Date  . 2d echo  11/29/03    normal LV size. normal EF   . Normal caronary arteries      per pt. 2003  . Cholecystectomy    . Inguinal hernia repair    . Tonsillectomy    . Right rotator cuff surgery    . Total hip arthroplasty  05/2008 and 10/2008    B hip - alusio  . Hernia repair      incisional hernia  . Breast biopsy  05/11/11    right breast  . Breast lumpectomy      right breast  . Back surgery  1983    lumbarectomy  . Finger surgery      ROS:  As stated in the  HPI and negative for all other systems.  PHYSICAL EXAM BP 125/70  Pulse 63  Ht 5\' 9"  (1.753 m)  Wt 242 lb 12.8 oz (110.133 kg)  BMI 35.84 kg/m2 GENERAL:  Well appearing HEENT:  Pupils equal round and reactive, fundi not visualized, oral mucosa unremarkable NECK:  No jugular venous distention, waveform within normal limits, carotid upstroke brisk and symmetric, no bruits, no thyromegaly LYMPHATICS:  No cervical, inguinal adenopathy LUNGS:  Clear to auscultation bilaterally BACK:  No CVA tenderness CHEST:  Unremarkable HEART:  PMI not displaced or sustained,S1 and S2 within normal limits, no S3, no S4, no clicks, no rubs, no murmurs ABD:  Flat, positive bowel sounds normal in frequency in pitch, no bruits, no rebound, no guarding, no midline pulsatile mass, no hepatomegaly, no splenomegaly EXT:  2 plus pulses throughout, moderate edema, no cyanosis no clubbing, chronic venous stasis changes. SKIN:  No rashes no nodules NEURO:   Cranial nerves II through XII grossly intact, motor grossly intact throughout PSYCH:  Cognitively intact, oriented to person place and time  EKG:  Sinus rhythm, rate 62, axis within normal limits, intervals within normal limits, no acute ST-T wave changes.  ASSESSMENT AND PLAN  EDEMA:  This is improved with weight loss and increased activity.  No change in therapy is indicated.   CAROTID STENOSIS:  This has been mild.  She will have followup carotid Dopplers next year.  HTN:  The blood pressure is at target. No change in medications is indicated. We will continue with therapeutic lifestyle changes (TLC).

## 2012-06-21 LAB — VITAMIN D 25 HYDROXY (VIT D DEFICIENCY, FRACTURES): Vit D, 25-Hydroxy: 43 ng/mL (ref 30–89)

## 2012-06-23 ENCOUNTER — Other Ambulatory Visit (INDEPENDENT_AMBULATORY_CARE_PROVIDER_SITE_OTHER): Payer: Medicare Other

## 2012-06-23 ENCOUNTER — Other Ambulatory Visit: Payer: Self-pay | Admitting: Internal Medicine

## 2012-06-23 DIAGNOSIS — E049 Nontoxic goiter, unspecified: Secondary | ICD-10-CM

## 2012-06-23 LAB — T3: T3, Total: 96.2 ng/dL (ref 80.0–204.0)

## 2012-06-23 LAB — TSH: TSH: 0.46 u[IU]/mL (ref 0.35–5.50)

## 2012-06-27 ENCOUNTER — Ambulatory Visit: Payer: Medicare Other | Admitting: Physician Assistant

## 2012-06-27 ENCOUNTER — Telehealth: Payer: Self-pay | Admitting: Oncology

## 2012-06-27 ENCOUNTER — Other Ambulatory Visit: Payer: Self-pay | Admitting: *Deleted

## 2012-06-27 ENCOUNTER — Ambulatory Visit (HOSPITAL_BASED_OUTPATIENT_CLINIC_OR_DEPARTMENT_OTHER): Payer: Medicare Other | Admitting: Oncology

## 2012-06-27 VITALS — BP 143/80 | HR 76 | Temp 98.1°F | Resp 20 | Ht 69.0 in | Wt 239.5 lb

## 2012-06-27 DIAGNOSIS — Z170 Estrogen receptor positive status [ER+]: Secondary | ICD-10-CM

## 2012-06-27 DIAGNOSIS — C50911 Malignant neoplasm of unspecified site of right female breast: Secondary | ICD-10-CM

## 2012-06-27 DIAGNOSIS — C50319 Malignant neoplasm of lower-inner quadrant of unspecified female breast: Secondary | ICD-10-CM

## 2012-06-27 MED ORDER — ANASTROZOLE 1 MG PO TABS: 1.0000 mg | ORAL_TABLET | Freq: Every day | ORAL | Status: DC

## 2012-06-27 NOTE — Progress Notes (Signed)
ID: Rachael [name] [name]   DOB: [birth year]  MR#: [id number]  WUJ#:[id number]  PCP: Rachael Paci, MD GYN: SU:  OTHER MD:   HISTORY OF PRESENT ILLNESS:  Rachael [name] [name] had routine screening mammography at [facility] 05/06/2011, showing an area of focal asymmetry in the lower inner quadrant of the right breast. Additional views 05/10/2011 confirmed a 9 mm area of irregularity without associated microcalcifications. Ultrasound of the right breast confirmed a 9 mm hypoechoic mass with posterior acoustic shadowing. Biopsy of this mass was performed the next day, and showed ([id number]) and invasive lobular carcinoma, E-cadherin negative, which was estrogen receptor positive at 97%, and progesterone receptor positive at 81%. There was no HER-2 amplification, and MIB-1 was 12%.  On may 07/31/2011 him of the patient underwent breast MRI, which showed a 1.5 cm mildly irregular enhancing mass in the upper inner quadrant of the right breast. There were no other masses of concern and no abnormal appearing lymph nodes. Accordingly, on 06/16/2011 the patient underwent right lumpectomy and sentinel lymph node sampling under Dr. Felicity [name], the final pathology (SZ [id number] [phone]) showing an invasive lobular carcinoma, grade 1, measuring 1.5 cm, with both sentinel lymph nodes negative. The patient's subsequent history is as detailed below.  INTERVAL HISTORY: Rachael [name] [name] returns today for followup of her breast cancer. 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 She has to "shakes" a day, otherwise the good diet, and the biggest problem she is having is that her mother is currently under hospice care.  REVIEW OF SYSTEMS: She tells me she is managing to lose weight despite having a poorly controlled thyroid problem. Hot flashes used to be 20 times a day now they're about 3 times a day. They do not occur at night, she tells me. She's not complaining of problems regarding vaginal dryness or  worsening aches and pains. A detailed review of systems was otherwise noncontributory  PAST MEDICAL HISTORY: Past Medical History  Diagnosis Date  . Chronic venous insufficiency   . PVD (peripheral vascular disease)     mild carotid 11/05  . Osteoarthritis     bilat hips, severe  . Obesity   . CONTACT DERMATITIS   . GLUCOSE INTOLERANCE   . VITAMIN D DEFICIENCY   . Edema     pt  states had edema of feet and legs has been on lasix per dr. to help  . CAROTID STENOSIS   . DUPUYTREN'S CONTRACTURE   . MENOPAUSAL SYNDROME   . HLD (hyperlipidemia)   . Hypothyroidism     Dr. Cherlyn [name] in [place] end[facility]   . Breast cancer     R    PAST SURGICAL HISTORY: Past Surgical History  Procedure Laterality Date  . 2d echo  11/29/03    normal LV size. normal EF   . Normal caronary arteries      per pt. 2003  . Cholecystectomy    . Inguinal hernia repair    . Tonsillectomy    . Right rotator cuff surgery    . Total hip arthroplasty  05/2008 and 10/2008    B hip - alusio  . Hernia repair      incisional hernia  . Breast biopsy  05/11/11    right breast  . Breast lumpectomy      right breast  . Back surgery  1983    lumbarectomy  . Finger surgery      FAMILY HISTORY Family History  Problem Relation Age of Onset  .  Coronary artery disease      family hx - female 1st degree relative <60  . Hypothyroidism      aunt   . Uterine cancer Mother 81  . Breast cancer Daughter 71    BRCA negative (no BART)  . Pancreatic cancer Maternal Grandfather     diagnosed in his 61s  . Anesthesia problems Neg Hx   . Prostate cancer Maternal Uncle     diagnosed in his 27s  . Colon cancer Cousin   . Prostate cancer Paternal Uncle     diagnosed in his 61s  . Clotting disorder Maternal Grandmother   . Uterine cancer Cousin   . Leukemia Cousin   . Prostate cancer Cousin    the patient's father died at the age of 49 from heart disease. The patient's mother is alive at age 66 as of July of 2013. She  had uterine cancer in her 55s. There is a significant cancer family on her side, to be detailed when the patient meets with her genetics counselor. The patient herself has no sisters, does have 3 brothers. One of the patient's daughters, Rachael [name] [name], was diagnosed with breast cancer at the age of 10. She was tested for the BRCA gene and was negative. There is no other breast or ovarian cancer in the immediate family to her knowledge.  GYNECOLOGIC HISTORY: Menarche age 17, menopause age 44. The patient tried Prempro briefly but did not like it. She is GX P5, first live birth age 16.  SOCIAL HISTORY: Rachael [name] worked in [employer], currently is developing a juice plus business. She is divorced and lives by herself. 3 of her 5 children live in town ([place], [place], and [place]) and 2 live in [place] and [place]). She has 7 grandchildren. She attends [employer] [address].   ADVANCED DIRECTIVES: Not in place  HEALTH MAINTENANCE: History  Substance Use Topics  . Smoking status: Never Smoker   . Smokeless tobacco: Never Used     Comment: Divorced, lives with 27 yo mother  . Alcohol Use: No     Colonoscopy: 2009  PAP: Grewal  [facility]  Lipid panel: Lebscher  Allergies  Allergen Reactions  . Shellfish Allergy Anaphylaxis and Hives    Hives all over the body.  . Percocet (Oxycodone-Acetaminophen) Hives  . Iodinated Diagnostic Agents     PT IS NOT AWARE OF IODINE ALLERGY, PREMEDICATED FOR PRIOR PREMEDS ONLY//A.C.  . Iodine Hives    Current Outpatient Prescriptions  Medication Sig Dispense Refill  . anastrozole (ARIMIDEX) 1 MG tablet Take 1 mg by mouth daily.      . cholecalciferol (VITAMIN D) 1000 UNITS tablet Take 2,000 Units by mouth daily.      . Cyanocobalamin (B-12 PO) Take 1 tablet by mouth daily.       Marland Kitchen levothyroxine (SYNTHROID, LEVOTHROID) 137 MCG tablet Take 137 mcg by mouth daily.       . Nutritional Supplements (JUICE PLUS FIBRE) LIQD  Take 4 capsules by mouth 3 (three) times daily. 4 vegetable capsules with breakfast, 4 fruit capsules with lunch and 4 berry capsules with dinner.      . Omega-3 350 MG CAPS Take 2 capsules by mouth daily.        Marland Kitchen triamcinolone cream (KENALOG) 0.1 % Apply 1 application topically 2 (two) times daily.      Marland Kitchen triamterene-hydrochlorothiazide (DYAZIDE) 37.5-25 MG per capsule Take 1 each (1 capsule total) by mouth every morning.  90 capsule  2   No current facility-administered medications for this visit.    OBJECTIVE: Middle-aged white woman in no acute distress Filed Vitals:   06/27/12 1529  BP: 143/80  Pulse: 76  Temp: 98.1 F (36.7 C)  Resp: 20     Body mass index is 35.35 kg/(m^2).    ECOG FS: 0  Sclerae unicteric Oropharynx clear No cervical or supraclavicular adenopathy Lungs no rales or rhonchi Heart regular rate and rhythm Abd benign MSK no focal spinal tenderness, no peripheral edema Neuro: nonfocal, well oriented, friendly affect Breasts: The right breast is status post lumpectomy. There is no evidence of disease recurrence The right axilla is benign. The left breast and left axilla are unremarkable.  LAB RESULTS: Lab Results  Component Value Date   WBC 13.7* 06/20/2012   NEUTROABS 9.5* 06/20/2012   HGB 13.6 06/20/2012   HCT 39.4 06/20/2012   MCV 86.7 06/20/2012   PLT 296 06/20/2012      Chemistry      Component Value Date/Time   NA 140 06/20/2012 1421   NA 140 08/10/2011 1556   K 4.0 06/20/2012 1421   K 3.7 08/10/2011 1556   CL 104 06/20/2012 1421   CL 102 08/10/2011 1556   CO2 26 06/20/2012 1421   CO2 28 08/10/2011 1556   BUN 17.9 06/20/2012 1421   BUN 25* 08/10/2011 1556   CREATININE 1.1 06/20/2012 1421   CREATININE 1.00 08/10/2011 1556      Component Value Date/Time   CALCIUM 9.6 06/20/2012 1421   CALCIUM 9.7 08/10/2011 1556   ALKPHOS 82 06/20/2012 1421   ALKPHOS 75 08/10/2011 1556   AST 16 06/20/2012 1421   AST 19 08/10/2011 1556   ALT 13 06/20/2012 1421   ALT 16  08/10/2011 1556   BILITOT 1.00 06/20/2012 1421   BILITOT 0.7 08/10/2011 1556       Lab Results  Component Value Date   LABCA2 33 08/10/2011    No components found with this basename: [id number]    No results found for this basename: INR,  in the last 168 hours  Urinalysis    Component Value Date/Time   COLORURINE LT. YELLOW 08/28/2010 1548   APPEARANCEUR CLEAR 08/28/2010 1548   LABSPEC 1.025 08/28/2010 1548   PHURINE 6.0 08/28/2010 1548   GLUCOSEU NEGATIVE 08/07/2010 1633   HGBUR TRACE-LYSED 08/28/2010 1548   BILIRUBINUR NEGATIVE 08/28/2010 1548   KETONESUR NEGATIVE 08/28/2010 1548   PROTEINUR NEGATIVE 08/07/2010 1633   UROBILINOGEN 0.2 08/28/2010 1548   NITRITE NEGATIVE 08/28/2010 1548   LEUKOCYTESUR TRACE 08/28/2010 1548    STUDIES: Ct Abdomen Pelvis W Contrast  06/13/2012   *RADIOLOGY REPORT*  Clinical Data: Bulging of the right lower abdominal wall, prior umbilical hernia repair and cholecystectomy, history of right breast cancer status post lumpectomy  CT ABDOMEN AND PELVIS WITH CONTRAST  Technique:  Multidetector CT imaging of the abdomen and pelvis was performed following the standard protocol during bolus administration of intravenous contrast.  BUN and creatinine were obtained on site at [facility] at [address]  Results: BUN 18 mg/dL, Creatinine 0.9 mg/dL.  Contrast:  125 ml Omnipaque-300 IV  Comparison: 07/10/2010  Findings: Mild subpleural reticulation at the lung bases.  Tiny hepatic cyst in the left hepatic dome (series 2/image 15).  Spleen, pancreas, and adrenal glands are within normal limits.  Status post cholecystectomy.  No intrahepatic or extrahepatic ductal dilatation.  Bilateral renal cysts, measuring up to 3.9 x 3.6 cm in  the medial right upper pole and 4.8 x 4.9 cm in the left lower pole.  No hydronephrosis.  No evidence of bowel obstruction.  Atherosclerotic calcifications of the abdominal aorta and branch vessels.  No abdominopelvic ascites.  No suspicious  abdominopelvic lymphadenopathy.  Status post hysterectomy.  No adnexal masses.  Bladder is partially obscured by streak artifact but grossly unremarkable.  Prior ventral hernia repair.  Palpable abnormality in the right lower abdominal wall is marked by a vitamin E capsule (series 2/image 50), corresponding to the site of prior right lateral abdominal wall hernia, without residual/recurrent hernia seen.  Degenerative changes of the visualized thoracolumbar spine. Bilateral total hip arthroplasties.  IMPRESSION:  No residual/recurrent hernia in the right lower abdominal wall, at the site of prior hernia repair, corresponding to the area of clinical concern.  Prior ventral hernia repair, cholecystectomy, and hysterectomy.  Bilateral renal cysts.   Original Report Authenticated By: Charline Bills, M.D.   Mm Digital Diagnostic Bilat  06/12/2012   *RADIOLOGY REPORT*  Clinical Data:  History of right breast cancer status post lumpectomy 2013  DIGITAL DIAGNOSTIC BILATERAL MAMMOGRAM WITH CAD  Comparison: With priors  Findings:  ACR Breast Density Category 1: The breast tissue is almost entirely fatty.  Lumpectomy changes are seen in the right breast.  There is no suspicious mass or malignant-type microcalcifications in either breast.  Mammographic images were processed with CAD.  IMPRESSION: No evidence of malignancy in either breast.  RECOMMENDATION: Bilateral diagnostic mammogram in 1 year is recommended.  I have discussed the findings and recommendations with the patient. Results were also provided in writing at the conclusion of the visit.  If applicable, a reminder letter will be sent to the patient regarding her next appointment.  BI-RADS CATEGORY 2:  Benign finding(s).   Original Report Authenticated By: Baird Lyons, M.D.    ASSESSMENT: 79 y.o. Rachael [name] [name] woman s/p Right lumpectomy and sentinel lymph node sampling 06/16/2011 for a T1c N0, stage IA invasive lobular breast cancer, grade 1, strongly estrogen and  progesterone positive, with no HER-2 amplification, and an Mib-1 of 12%  (1) started and anastrozole July 2013, briefly discontinued because of side effects but resumed October 2013.  PLAN: Rachael [name] [name]'s hot flashes have nearly resolved and she is tolerating the anastrozole without any other side effects of concern. Overall she is doing terrific and very positive and her outlook. We're going to see her again in 6 months. Once she reaches the 2 year mark, around July of 2015, we will start seeing her on a once a year basis alternating with her surgeon. She knows to call for any problems that may develop before the next visit. Vedant Shehadeh C    06/27/2012

## 2012-06-28 ENCOUNTER — Telehealth: Payer: Self-pay | Admitting: *Deleted

## 2012-06-28 NOTE — Telephone Encounter (Signed)
Lm informing the pt that her appt for 12/26/12@10am  was changed to 10:45am. i also made her aware that i would mail a letter/cal as well...td

## 2012-08-01 ENCOUNTER — Other Ambulatory Visit: Payer: Self-pay | Admitting: *Deleted

## 2012-08-16 ENCOUNTER — Other Ambulatory Visit: Payer: Self-pay

## 2012-10-02 ENCOUNTER — Other Ambulatory Visit: Payer: Self-pay | Admitting: Obstetrics and Gynecology

## 2012-10-04 ENCOUNTER — Encounter: Payer: Self-pay | Admitting: Internal Medicine

## 2012-10-04 ENCOUNTER — Other Ambulatory Visit (INDEPENDENT_AMBULATORY_CARE_PROVIDER_SITE_OTHER): Payer: Medicare Other

## 2012-10-04 ENCOUNTER — Ambulatory Visit (INDEPENDENT_AMBULATORY_CARE_PROVIDER_SITE_OTHER): Payer: Medicare Other | Admitting: Internal Medicine

## 2012-10-04 VITALS — BP 118/60 | HR 58 | Temp 97.6°F | Wt 228.0 lb

## 2012-10-04 DIAGNOSIS — Z0000 Encounter for general adult medical examination without abnormal findings: Secondary | ICD-10-CM

## 2012-10-04 DIAGNOSIS — E785 Hyperlipidemia, unspecified: Secondary | ICD-10-CM

## 2012-10-04 DIAGNOSIS — I872 Venous insufficiency (chronic) (peripheral): Secondary | ICD-10-CM

## 2012-10-04 DIAGNOSIS — E039 Hypothyroidism, unspecified: Secondary | ICD-10-CM

## 2012-10-04 LAB — LIPID PANEL
Cholesterol: 185 mg/dL (ref 0–200)
LDL Cholesterol: 110 mg/dL — ABNORMAL HIGH (ref 0–99)

## 2012-10-04 NOTE — Assessment & Plan Note (Signed)
Never on statin Check lipids annually and consider tx as needed Working on weight reduction with diet changes, encouraged and provided

## 2012-10-04 NOTE — Assessment & Plan Note (Signed)
Follows with endo in [place] for same The current medical regimen is effective;  continue present plan and medications.

## 2012-10-04 NOTE — Patient Instructions (Addendum)
It was good to see you today. We have reviewed your prior records including labs and tests today Health Maintenance reviewed - all recommended immunizations and age-appropriate screenings are up-to-date or declined. Test(s) ordered today for cholesterol. Your results will be called to you after review (48-72hours after test completion). If any changes need to be made, you will be notified at that time. Medications reviewed and updated, no changes recommended at this time. Please schedule followup in 1 year for annual review, call sooner if problems.  Health Maintenance, Females A healthy lifestyle and preventative care can promote health and wellness.  Maintain regular health, dental, and eye exams.  Eat a healthy diet. Foods like vegetables, fruits, whole grains, low-fat dairy products, and lean protein foods contain the nutrients you need without too many calories. Decrease your intake of foods high in solid fats, added sugars, and salt. Get information about a proper diet from your caregiver, if necessary.  Regular physical exercise is one of the most important things you can do for your health. Most adults should get at least 150 minutes of moderate-intensity exercise (any activity that increases your heart rate and causes you to sweat) each week. In addition, most adults need muscle-strengthening exercises on 2 or more days a week.   Maintain a healthy weight. The body mass index (BMI) is a screening tool to identify possible weight problems. It provides an estimate of body fat based on height and weight. Your caregiver can help determine your BMI, and can help you achieve or maintain a healthy weight. For adults 20 years and older:  A BMI below 18.5 is considered underweight.  A BMI of 18.5 to 24.9 is normal.  A BMI of 25 to 29.9 is considered overweight.  A BMI of 30 and above is considered obese.  Maintain normal blood lipids and cholesterol by exercising and minimizing your intake  of saturated fat. Eat a balanced diet with plenty of fruits and vegetables. Blood tests for lipids and cholesterol should begin at age 55 and be repeated every 5 years. If your lipid or cholesterol levels are high, you are over 50, or you are a high risk for heart disease, you may need your cholesterol levels checked more frequently.Ongoing high lipid and cholesterol levels should be treated with medicines if diet and exercise are not effective.  If you smoke, find out from your caregiver how to quit. If you do not use tobacco, do not start.  If you are pregnant, do not drink alcohol. If you are breastfeeding, be very cautious about drinking alcohol. If you are not pregnant and choose to drink alcohol, do not exceed 1 drink per day. One drink is considered to be 12 ounces (355 mL) of beer, 5 ounces (148 mL) of wine, or 1.5 ounces (44 mL) of liquor.  Avoid use of street drugs. Do not share needles with anyone. Ask for help if you need support or instructions about stopping the use of drugs.  High blood pressure causes heart disease and increases the risk of stroke. Blood pressure should be checked at least every 1 to 2 years. Ongoing high blood pressure should be treated with medicines, if weight loss and exercise are not effective.  If you are 66 to 77 years old, ask your caregiver if you should take aspirin to prevent strokes.  Diabetes screening involves taking a blood sample to check your fasting blood sugar level. This should be done once every 3 years, after age 69, if you are  within normal weight and without risk factors for diabetes. Testing should be considered at a younger age or be carried out more frequently if you are overweight and have at least 1 risk factor for diabetes.  Breast cancer screening is essential preventative care for women. You should practice "breast self-awareness." This means understanding the normal appearance and feel of your breasts and may include breast  self-examination. Any changes detected, no matter how small, should be reported to a caregiver. Women in their 27s and 30s should have a clinical breast exam (CBE) by a caregiver as part of a regular health exam every 1 to 3 years. After age 81, women should have a CBE every year. Starting at age 58, women should consider having a mammogram (breast X-ray) every year. Women who have a family history of breast cancer should talk to their caregiver about genetic screening. Women at a high risk of breast cancer should talk to their caregiver about having an MRI and a mammogram every year.  The Pap test is a screening test for cervical cancer. Women should have a Pap test starting at age 66. Between ages 73 and 32, Pap tests should be repeated every 2 years. Beginning at age 81, you should have a Pap test every 3 years as long as the past 3 Pap tests have been normal. If you had a hysterectomy for a problem that was not cancer or a condition that could lead to cancer, then you no longer need Pap tests. If you are between ages 24 and 44, and you have had normal Pap tests going back 10 years, you no longer need Pap tests. If you have had past treatment for cervical cancer or a condition that could lead to cancer, you need Pap tests and screening for cancer for at least 20 years after your treatment. If Pap tests have been discontinued, risk factors (such as a new sexual partner) need to be reassessed to determine if screening should be resumed. Some women have medical problems that increase the chance of getting cervical cancer. In these cases, your caregiver may recommend more frequent screening and Pap tests.  The human papillomavirus (HPV) test is an additional test that may be used for cervical cancer screening. The HPV test looks for the virus that can cause the cell changes on the cervix. The cells collected during the Pap test can be tested for HPV. The HPV test could be used to screen women aged 73 years and  older, and should be used in women of any age who have unclear Pap test results. After the age of 63, women should have HPV testing at the same frequency as a Pap test.  Colorectal cancer can be detected and often prevented. Most routine colorectal cancer screening begins at the age of 19 and continues through age 61. However, your caregiver may recommend screening at an earlier age if you have risk factors for colon cancer. On a yearly basis, your caregiver may provide home test kits to check for hidden blood in the stool. Use of a small camera at the end of a tube, to directly examine the colon (sigmoidoscopy or colonoscopy), can detect the earliest forms of colorectal cancer. Talk to your caregiver about this at age 87, when routine screening begins. Direct examination of the colon should be repeated every 5 to 10 years through age 47, unless early forms of pre-cancerous polyps or small growths are found.  Hepatitis C blood testing is recommended for all people born  from 1945 through 1965 and any individual with known risks for hepatitis C.  Practice safe sex. Use condoms and avoid high-risk sexual practices to reduce the spread of sexually transmitted infections (STIs). Sexually active women aged 74 and younger should be checked for Chlamydia, which is a common sexually transmitted infection. Older women with new or multiple partners should also be tested for Chlamydia. Testing for other STIs is recommended if you are sexually active and at increased risk.  Osteoporosis is a disease in which the bones lose minerals and strength with aging. This can result in serious bone fractures. The risk of osteoporosis can be identified using a bone density scan. Women ages 42 and over and women at risk for fractures or osteoporosis should discuss screening with their caregivers. Ask your caregiver whether you should be taking a calcium supplement or vitamin D to reduce the rate of osteoporosis.  Menopause can be  associated with physical symptoms and risks. Hormone replacement therapy is available to decrease symptoms and risks. You should talk to your caregiver about whether hormone replacement therapy is right for you.  Use sunscreen with a sun protection factor (SPF) of 30 or greater. Apply sunscreen liberally and repeatedly throughout the day. You should seek shade when your shadow is shorter than you. Protect yourself by wearing long sleeves, pants, a wide-brimmed hat, and sunglasses year round, whenever you are outdoors.  Notify your caregiver of new moles or changes in moles, especially if there is a change in shape or color. Also notify your caregiver if a mole is larger than the size of a pencil eraser.  Stay current with your immunizations. Document Released: 07/13/2010 Document Revised: 03/22/2011 Document Reviewed: 07/13/2010 [facility] Patient Information 2014 [place], [place].

## 2012-10-04 NOTE — Progress Notes (Signed)
Subjective:    Patient ID: Rachael [name], female    DOB: Aug 02, 1933, 79 y.o.   MRN: [id number]  HPI  Here for medicare wellness  Diet: heart healthy, diabetic Physical activity: mod Depression/mood screen: negative Hearing: intact to whispered voice Visual acuity: grossly normal, performs annual eye exam  ADLs: capable Fall risk: none Home safety: good Cognitive evaluation: intact to orientation, naming, recall and repetition EOL planning: adv directives, full code/ I agree  I have personally reviewed and have noted 1. The patient's medical and social history 2. Their use of alcohol, tobacco or illicit drugs 3. Their current medications and supplements 4. The patient's functional ability including ADL's, fall risks, home safety risks and hearing or visual impairment. 5. Diet and physical activities 6. Evidence for depression or mood disorders   also reviewed chronic medical issues:   Hypothyroid - follows with endo in [place] for same - recent labs "ok" - no change skin bowels - reports compliance with ongoing medical treatment and no changes in medication dose or frequency. denies adverse side effects related to current therapy.    Dyslipidemia - reports compliance with ongoing medical treatment and no changes in medication dose or frequency. denies adverse side effects related to current therapy.   Chronic venous insuff - uses diuretics daily - no hx HTN -   Breast cancer - s/p right lumpectomy - follows with Dr Darnelle Catalan - on Arimidex  Past Medical History  Diagnosis Date  . Chronic venous insufficiency   . PVD (peripheral vascular disease)     mild carotid 11/05  . Osteoarthritis     bilat hips, severe  . Obesity   . CONTACT DERMATITIS   . GLUCOSE INTOLERANCE   . VITAMIN D DEFICIENCY   . Edema     pt  states had edema of feet and legs has been on lasix per dr. to help  . CAROTID STENOSIS   . DUPUYTREN'S CONTRACTURE   . MENOPAUSAL SYNDROME   . HLD  (hyperlipidemia)   . Hypothyroidism     Dr. Cherlyn Roberts in [place] endo   . Breast cancer     R   Family History  Problem Relation Age of Onset  . Coronary artery disease      family hx - female 1st degree relative <60  . Hypothyroidism      aunt   . Uterine cancer Mother 45  . Breast cancer Daughter 21    BRCA negative (no BART)  . Pancreatic cancer Maternal Grandfather     diagnosed in his 88s  . Anesthesia problems Neg Hx   . Prostate cancer Maternal Uncle     diagnosed in his 79s  . Colon cancer Cousin   . Prostate cancer Paternal Uncle     diagnosed in his 47s  . Clotting disorder Maternal Grandmother   . Uterine cancer Cousin   . Leukemia Cousin   . Prostate cancer Cousin    History  Substance Use Topics  . Smoking status: Never Smoker   . Smokeless tobacco: Never Used     Comment: Divorced, lives with 36 yo mother  . Alcohol Use: No    Review of Systems  Constitutional: Negative for fever, chills and appetite change.  Respiratory: Negative for cough, chest tightness, shortness of breath and wheezing.   Cardiovascular: Positive for leg swelling. Negative for chest pain and palpitations.  Gastrointestinal: Negative for nausea, vomiting, diarrhea, constipation and abdominal distention.  Skin: Negative for rash.  Neurological: Negative for dizziness  and syncope.  All other systems reviewed and are negative.       Objective:   Physical Exam  Constitutional: She is oriented to person, place, and time. She appears well-developed and well-nourished. No distress.  HENT:  Head: Normocephalic and atraumatic.  Neck: Normal range of motion. Neck supple. No thyromegaly present.  Cardiovascular: Normal rate, regular rhythm and normal heart sounds.   No murmur heard. Pulmonary/Chest: Effort normal and breath sounds normal. No respiratory distress. She has no wheezes.  Abdominal: Soft. Bowel sounds are normal. She exhibits no distension.  Musculoskeletal: Normal range  of motion.  Lymphadenopathy:    She has no cervical adenopathy.  Neurological: She is alert and oriented to person, place, and time.  Skin: Skin is warm and dry.  Psychiatric: She has a normal mood and affect. Her behavior is normal. Judgment and thought content normal.    Wt Readings from Last 3 Encounters:  10/04/12 228 lb (103.42 kg)  06/27/12 239 lb 8 oz (108.636 kg)  06/20/12 242 lb 12.8 oz (110.133 kg)   BP Readings from Last 3 Encounters:  10/04/12 118/60  06/27/12 143/80  06/20/12 125/70       Assessment & Plan:   V70.0/CPX/awv - Today patient counseled on age appropriate routine health concerns for screening and prevention, each reviewed and up to date or declined. Immunizations reviewed and up to date or declined. Labs orderd and reviewed. Risk factors for depression reviewed and negative. Hearing function and visual acuity are intact. ADLs screened and addressed as needed. Functional ability and level of safety reviewed and appropriate. Education, counseling and referrals performed based on assessed risks today. Patient provided with a copy of personalized plan for preventive services.  Also See problem list. Medications and labs reviewed today.

## 2012-10-04 NOTE — Assessment & Plan Note (Signed)
Wearing compression hose (fitted) since 04/2011 continue support hose -okay for massage device as needed

## 2012-11-09 ENCOUNTER — Ambulatory Visit (INDEPENDENT_AMBULATORY_CARE_PROVIDER_SITE_OTHER): Payer: BC Managed Care – HMO | Admitting: General Surgery

## 2012-11-09 ENCOUNTER — Encounter (INDEPENDENT_AMBULATORY_CARE_PROVIDER_SITE_OTHER): Payer: Self-pay | Admitting: General Surgery

## 2012-11-09 VITALS — BP 144/62 | HR 60 | Resp 16 | Ht 68.0 in | Wt 228.0 lb

## 2012-11-09 DIAGNOSIS — C50911 Malignant neoplasm of unspecified site of right female breast: Secondary | ICD-10-CM

## 2012-11-09 DIAGNOSIS — C50919 Malignant neoplasm of unspecified site of unspecified female breast: Secondary | ICD-10-CM

## 2012-11-09 NOTE — Patient Instructions (Signed)
Continue arimidex Continue regular self exams 

## 2012-11-09 NOTE — Progress Notes (Signed)
Subjective:     Patient ID: Rachael [name], female   DOB: January 28, 1933, 79 y.o.   MRN: [id number]  HPI The patient is a 79 year old white female who is one half years status post right lumpectomy and negative sentinel node biopsy for a T1 C. N0 right breast cancer. She has been doing her juicer and she feels great. She denies any breast pain. She denies any discharge from her nipple. She has gone back on anastrazole and Is tolerating it much better than the first time.  Review of Systems  Constitutional: Negative.   HENT: Negative.   Eyes: Negative.   Respiratory: Negative.   Cardiovascular: Negative.   Gastrointestinal: Negative.   Endocrine: Negative.   Genitourinary: Negative.   Musculoskeletal: Negative.   Skin: Negative.   Allergic/Immunologic: Negative.   Neurological: Negative.   Hematological: Negative.   Psychiatric/Behavioral: Negative.        Objective:   Physical Exam  Constitutional: She is oriented to person, place, and time. She appears well-developed and well-nourished.  HENT:  Head: Normocephalic and atraumatic.  Eyes: Conjunctivae and EOM are normal. Pupils are equal, round, and reactive to light.  Neck: Normal range of motion. Neck supple.  Cardiovascular: Normal rate, regular rhythm and normal heart sounds.   Pulmonary/Chest: Effort normal and breath sounds normal.  Abdominal: Soft. Bowel sounds are normal.  Musculoskeletal: Normal range of motion.  Neurological: She is alert and oriented to person, place, and time.  Skin: Skin is warm and dry.  Psychiatric: She has a normal mood and affect. Her behavior is normal.       Assessment:     The patient is 1-1/2 years status post right lumpectomy for breast cancer     Plan:     At this point she will continue to do regular self exams. She will continue to take anastrozole. I will plan to see her back in 6 months.

## 2012-11-27 ENCOUNTER — Telehealth: Payer: Self-pay | Admitting: *Deleted

## 2012-11-27 MED ORDER — TRIAMTERENE-HCTZ 37.5-25 MG PO CAPS: 1.0000 | ORAL_CAPSULE | ORAL | Status: DC

## 2012-11-27 NOTE — Telephone Encounter (Signed)
refill 

## 2012-12-15 ENCOUNTER — Ambulatory Visit: Payer: Medicare Other | Admitting: Internal Medicine

## 2012-12-18 ENCOUNTER — Other Ambulatory Visit: Payer: Self-pay | Admitting: *Deleted

## 2012-12-18 DIAGNOSIS — C50911 Malignant neoplasm of unspecified site of right female breast: Secondary | ICD-10-CM

## 2012-12-19 ENCOUNTER — Other Ambulatory Visit: Payer: Medicare Other | Admitting: Lab

## 2012-12-20 ENCOUNTER — Telehealth: Payer: Self-pay | Admitting: *Deleted

## 2012-12-20 ENCOUNTER — Other Ambulatory Visit (HOSPITAL_BASED_OUTPATIENT_CLINIC_OR_DEPARTMENT_OTHER): Payer: Medicare Other

## 2012-12-20 DIAGNOSIS — C50911 Malignant neoplasm of unspecified site of right female breast: Secondary | ICD-10-CM

## 2012-12-20 DIAGNOSIS — C50319 Malignant neoplasm of lower-inner quadrant of unspecified female breast: Secondary | ICD-10-CM

## 2012-12-20 LAB — CBC WITH DIFFERENTIAL/PLATELET
Basophils Absolute: 0 10*3/uL (ref 0.0–0.1)
Eosinophils Absolute: 0.3 10*3/uL (ref 0.0–0.5)
HCT: 36.6 % (ref 34.8–46.6)
HGB: 12.3 g/dL (ref 11.6–15.9)
MCV: 87.8 fL (ref 79.5–101.0)
NEUT#: 5 10*3/uL (ref 1.5–6.5)
RDW: 13.3 % (ref 11.2–14.5)
lymph#: 2.2 10*3/uL (ref 0.9–3.3)

## 2012-12-20 LAB — COMPREHENSIVE METABOLIC PANEL (CC13)
Albumin: 3.6 g/dL (ref 3.5–5.0)
Anion Gap: 11 mEq/L (ref 3–11)
BUN: 17.9 mg/dL (ref 7.0–26.0)
Calcium: 10 mg/dL (ref 8.4–10.4)
Chloride: 108 mEq/L (ref 98–109)
Glucose: 97 mg/dl (ref 70–140)
Potassium: 4.1 mEq/L (ref 3.5–5.1)

## 2012-12-20 NOTE — Telephone Encounter (Signed)
Pt called to rs her ftka for 12/19/12 until today. gv appt for 12/20/12 @ 11:30am for labs...td

## 2012-12-25 ENCOUNTER — Ambulatory Visit (INDEPENDENT_AMBULATORY_CARE_PROVIDER_SITE_OTHER): Payer: Medicare Other | Admitting: Internal Medicine

## 2012-12-25 ENCOUNTER — Encounter: Payer: Self-pay | Admitting: Internal Medicine

## 2012-12-25 VITALS — BP 130/72 | HR 67 | Temp 98.4°F | Wt 225.1 lb

## 2012-12-25 DIAGNOSIS — H6980 Other specified disorders of Eustachian tube, unspecified ear: Secondary | ICD-10-CM

## 2012-12-25 DIAGNOSIS — J309 Allergic rhinitis, unspecified: Secondary | ICD-10-CM

## 2012-12-25 DIAGNOSIS — H6983 Other specified disorders of Eustachian tube, bilateral: Secondary | ICD-10-CM

## 2012-12-25 MED ORDER — FLUTICASONE PROPIONATE 50 MCG/ACT NA SUSP: 1.0000 | Freq: Every day | NASAL | Status: DC

## 2012-12-25 MED ORDER — PREDNISONE (PAK) 10 MG PO TABS: ORAL_TABLET | ORAL | Status: DC

## 2012-12-25 NOTE — Progress Notes (Signed)
Pre-visit discussion using our clinic review tool. No additional management support is needed unless otherwise documented below in the visit note.  

## 2012-12-25 NOTE — Progress Notes (Signed)
Subjective:    Patient ID: Rachael [name], female    DOB: [birth year], 79 y.o.   MRN: [id number]  HPI  Complains of bilateral ear congestion Ongoing symptoms for 2 months No ear pain, drainage, fever, headache Initially with runny nose and sneezing, none in past 6 weeks Denies postnasal drip or sore throat Symptoms worse following air travel to [place] last week  Past Medical History  Diagnosis Date  . Chronic venous insufficiency   . PVD (peripheral vascular disease)     mild carotid 11/05  . Osteoarthritis     bilat hips, severe  . Obesity   . CONTACT DERMATITIS   . GLUCOSE INTOLERANCE   . VITAMIN D DEFICIENCY   . Edema     pt  states had edema of feet and legs has been on lasix per dr. to help  . CAROTID STENOSIS   . DUPUYTREN'S CONTRACTURE   . MENOPAUSAL SYNDROME   . HLD (hyperlipidemia)   . Hypothyroidism     Dr. Cherlyn Roberts in [place] endo   . Breast cancer     R    Review of Systems  Constitutional: Negative for fever, chills and fatigue.  HENT: Positive for sinus pressure. Negative for ear discharge, ear pain, postnasal drip and sneezing.   Neurological: Negative for light-headedness and headaches.       Objective:   Physical Exam BP 130/72  Pulse 67  Temp(Src) 98.4 F (36.9 C) (Oral)  Wt 225 lb 1.9 oz (102.114 kg)  SpO2 95% Wt Readings from Last 3 Encounters:  12/25/12 225 lb 1.9 oz (102.114 kg)  11/09/12 228 lb (103.42 kg)  10/04/12 228 lb (103.42 kg)   Constitutional: She is overweight, but appears well-developed and well-nourished. No distress.  HENT: Head: Normocephalic and atraumatic. Ears: B TMs Hazy with evidence of trace clear effusion, no erythema; Nose: mild boggy and pale turbinates. No drainage or turbinate swelling.  Mouth/Throat: Oropharynx is clear and moist. No oropharyngeal exudate.  Eyes: Conjunctivae and EOM are normal. Pupils are equal, round, and reactive to light. No scleral icterus.  Neck: Normal range of motion. Neck  supple. No JVD present. No thyromegaly present.  Cardiovascular: Normal rate, regular rhythm and normal heart sounds.  No murmur heard. No BLE edema. Pulmonary/Chest: Effort normal and breath sounds normal. No respiratory distress. She has no wheezes.  Skin: Skin is warm and dry. No rash noted. No erythema.  Psychiatric: She has a normal mood and affect. Her behavior is normal. Judgment and thought content normal.   Lab Results  Component Value Date   WBC 8.5 12/20/2012   HGB 12.3 12/20/2012   HCT 36.6 12/20/2012   PLT 309 12/20/2012   GLUCOSE 97 12/20/2012   CHOL 185 10/04/2012   TRIG 103.0 10/04/2012   HDL 54.30 10/04/2012   LDLDIRECT 115.8 11/03/2011   LDLCALC 110* 10/04/2012   ALT 13 12/20/2012   AST 18 12/20/2012   NA 143 12/20/2012   K 4.1 12/20/2012   CL 104 06/20/2012   CREATININE 0.8 12/20/2012   BUN 17.9 12/20/2012   CO2 25 12/20/2012   TSH 0.46 06/23/2012   INR 0.98 08/13/2010   HGBA1C 6.0 11/03/2011       Assessment & Plan:   Eustachian tube dysfunction bilateral.  Allergic sinusitis  No evidence for infection on exam or history Review diagnosis and treatment with patient today  Treat with topical nasal steroids, systemic burst prednisone taper if severe  Patient will call if symptoms unimproved in  next several weeks, sooner if worse

## 2012-12-25 NOTE — Patient Instructions (Signed)
It was good to see you today.  If you develop worsening symptoms or fever, call and we can reconsider antibiotics, but it does not appear necessary to use antibiotics at this time.  Use nasal steroid each day to help with inflammation and prednisone pack taper over 6 days if severe Your prescription(s) have been submitted to your mail order pharmacy. Please take as directed and contact our office if you believe you are having problem(s) with the medication(s).

## 2012-12-26 ENCOUNTER — Ambulatory Visit (HOSPITAL_BASED_OUTPATIENT_CLINIC_OR_DEPARTMENT_OTHER): Payer: Medicare Other | Admitting: Physician Assistant

## 2012-12-26 ENCOUNTER — Telehealth: Payer: Self-pay | Admitting: Oncology

## 2012-12-26 ENCOUNTER — Encounter: Payer: Self-pay | Admitting: Physician Assistant

## 2012-12-26 ENCOUNTER — Ambulatory Visit: Payer: Medicare Other | Admitting: Oncology

## 2012-12-26 VITALS — BP 150/77 | HR 67 | Temp 98.1°F | Resp 18 | Ht 68.0 in | Wt 228.1 lb

## 2012-12-26 DIAGNOSIS — Z780 Asymptomatic menopausal state: Secondary | ICD-10-CM

## 2012-12-26 DIAGNOSIS — C50919 Malignant neoplasm of unspecified site of unspecified female breast: Secondary | ICD-10-CM

## 2012-12-26 DIAGNOSIS — E559 Vitamin D deficiency, unspecified: Secondary | ICD-10-CM

## 2012-12-26 DIAGNOSIS — C50911 Malignant neoplasm of unspecified site of right female breast: Secondary | ICD-10-CM

## 2012-12-26 DIAGNOSIS — M1990 Unspecified osteoarthritis, unspecified site: Secondary | ICD-10-CM

## 2012-12-26 DIAGNOSIS — Z853 Personal history of malignant neoplasm of breast: Secondary | ICD-10-CM

## 2012-12-26 MED ORDER — ANASTROZOLE 1 MG PO TABS: 1.0000 mg | ORAL_TABLET | Freq: Every day | ORAL | Status: DC

## 2012-12-26 NOTE — Progress Notes (Signed)
ID: [name]   DOB: 08-07-1933  MR#: [id number]  NWG#:[id number]  PCP: Rene Paci, MD GYN: Marcelle Overlie, MD SU: Chevis Pretty, MD OTHER MD:  Rollene Rotunda, MD;  Para Skeans, MD; Lurline Hare, MD;  Claudette Head, MD  CHIEF COMPLAINT:  Right Breast Cancer   HISTORY OF PRESENT ILLNESS:  Rachael [name] had routine screening mammography at [facility] 05/06/2011, showing an area of focal asymmetry in the lower inner quadrant of the right breast. Additional views 05/10/2011 confirmed a 9 mm area of irregularity without associated microcalcifications. Ultrasound of the right breast confirmed a 9 mm hypoechoic mass with posterior acoustic shadowing. Biopsy of this mass was performed the next day, and showed ([id number]) and invasive lobular carcinoma, E-cadherin negative, which was estrogen receptor positive at 97%, and progesterone receptor positive at 81%. There was no HER-2 amplification, and MIB-1 was 12%.  On may 07/31/2011 him of the patient underwent breast MRI, which showed a 1.5 cm mildly irregular enhancing mass in the upper inner quadrant of the right breast. There were no other masses of concern and no abnormal appearing lymph nodes. Accordingly, on 06/16/2011 the patient underwent right lumpectomy and sentinel lymph node sampling under Dr. Felicity Pellegrini, the final pathology (SZ [id number]) showing an invasive lobular carcinoma, grade 1, measuring 1.5 cm, with both sentinel lymph nodes negative. The patient's subsequent history is as detailed below.  INTERVAL HISTORY: Rachael [name] returns alone today for followup of her right breast cancer. She is feeling great, and tells me her energy level is better than ever. In fact her only complaint is some joint pain associated with chronic arthritis. She tells me this is stable, and has not worsened with the anastrozole.  She continues to lose weight and is happy about that. She attributes her current well-being to using Juice Plus supplements.  REVIEW OF  SYSTEMS: Rachael [name] denies any recent illnesses and has had no fevers or chills. She's having no excessive hot flashes and denies any vaginal dryness or abnormal bleeding. She's eating well denies any nausea or change in bowel or bladder habits. She's had no cough, shortness of breath, chest pain, or palpitations. She's had no abnormal headaches, dizziness, or change in vision. She's had no peripheral swelling.  A detailed review of systems is otherwise stable and noncontributory.   PAST MEDICAL HISTORY: Past Medical History  Diagnosis Date  . Chronic venous insufficiency   . PVD (peripheral vascular disease)     mild carotid 11/05  . Osteoarthritis     bilat hips, severe  . Obesity   . CONTACT DERMATITIS   . GLUCOSE INTOLERANCE   . VITAMIN D DEFICIENCY   . Edema     pt  states had edema of feet and legs has been on lasix per dr. to help  . CAROTID STENOSIS   . DUPUYTREN'S CONTRACTURE   . MENOPAUSAL SYNDROME   . HLD (hyperlipidemia)   . Hypothyroidism     Dr. Cherlyn Roberts in [place] endo   . Breast cancer     R    PAST SURGICAL HISTORY: Past Surgical History  Procedure Laterality Date  . 2d echo  11/29/03    normal LV size. normal EF   . Normal caronary arteries      per pt. 2003  . Cholecystectomy    . Inguinal hernia repair    . Tonsillectomy    . Right rotator cuff surgery    . Total hip arthroplasty  05/2008 and 10/2008    B  hip - alusio  . Hernia repair      incisional hernia  . Breast biopsy  05/11/11    right breast  . Breast lumpectomy      right breast  . Back surgery  1983    lumbarectomy  . Finger surgery      FAMILY HISTORY Family History  Problem Relation Age of Onset  . Coronary artery disease      family hx - female 1st degree relative <60  . Hypothyroidism      aunt   . Uterine cancer Mother 60  . Breast cancer Daughter 29    BRCA negative (no BART)  . Pancreatic cancer Maternal Grandfather     diagnosed in his 43s  . Anesthesia problems Neg Hx    . Prostate cancer Maternal Uncle     diagnosed in his 50s  . Colon cancer Cousin   . Prostate cancer Paternal Uncle     diagnosed in his 39s  . Clotting disorder Maternal Grandmother   . Uterine cancer Cousin   . Leukemia Cousin   . Prostate cancer Cousin    the patient's father died at the age of 7 from heart disease. The patient's mother is alive at age 5 as of July of 2013. She had uterine cancer in her 68s. There is a significant cancer family on her side, to be detailed when the patient meets with her genetics counselor. The patient herself has no sisters, does have 3 brothers. One of the patient's daughters, Rachael [name], was diagnosed with breast cancer at the age of 27. She was tested for the BRCA gene and was negative. There is no other breast or ovarian cancer in the immediate family to her knowledge.  GYNECOLOGIC HISTORY: Menarche age 58, menopause age 44. The patient tried Prempro briefly but did not like it. She is GX P5, first live birth age 70.  SOCIAL HISTORY:   (Updated December 2014) Rachael [name] worked in [employer], currently is developing a [employer]. She is divorced and lives by herself. 3 of her 5 children live in town ([place], [place], and [place]) and 2 live in [place] and [place]). She has 7 grandchildren. She attends the [employer] [address].   ADVANCED DIRECTIVES: Not in place  HEALTH MAINTENANCE:  (Updated December 2014) History  Substance Use Topics  . Smoking status: Never Smoker   . Smokeless tobacco: Never Used  . Alcohol Use: No     Colonoscopy: 2009/[facility]  PAP: Sept 2014/[facility]  Bone density: [facility] 09[facility]07[facility], Normal  Lipid panel: Sept 2014/ [facility]    Allergies  Allergen Reactions  . Shellfish Allergy Anaphylaxis and Hives    Hives all over the body.  . Percocet [Oxycodone-Acetaminophen] Hives  . Iodinated Diagnostic Agents     PT IS NOT AWARE OF IODINE ALLERGY, PREMEDICATED FOR PRIOR PREMEDS ONLY//A.C.  . Iodine Hives     Current Outpatient Prescriptions  Medication Sig Dispense Refill  . anastrozole (ARIMIDEX) 1 MG tablet Take 1 tablet (1 mg total) by mouth daily.  90 tablet  3  . cholecalciferol (VITAMIN D) 1000 UNITS tablet Take 2,000 Units by mouth daily.      . Cyanocobalamin (B-12 PO) Take 1 tablet by mouth daily.       . fluticasone (FLONASE) 50 MCG/ACT nasal spray Place 1 spray into both nostrils daily.  16 g  2  . levothyroxine (SYNTHROID, LEVOTHROID) 137 MCG tablet Take 137 mcg by mouth daily.       Marland Kitchen  Nutritional Supplements (JUICE PLUS FIBRE) LIQD Take 4 capsules by mouth 3 (three) times daily. 4 vegetable capsules with breakfast, 4 fruit capsules with lunch and 4 Rachael [name] capsules with dinner.      . Omega-3 350 MG CAPS Take 2 capsules by mouth daily.        Marland Kitchen triamcinolone cream (KENALOG) 0.1 % Apply 1 application topically 2 (two) times daily.      Marland Kitchen triamterene-hydrochlorothiazide (DYAZIDE) 37.5-25 MG per capsule Take 1 each (1 capsule total) by mouth every morning.  90 capsule  3  . predniSONE (STERAPRED UNI-PAK) 10 MG tablet Take by mouth as directed. As directed x 6 days  21 tablet  0   No current facility-administered medications for this visit.    OBJECTIVE: Middle-aged white woman in no acute distress Filed Vitals:   12/26/12 1101  BP: 150/77  Pulse: 67  Temp: 98.1 F (36.7 C)  Resp: 18     Body mass index is 34.69 kg/(m^2).    ECOG FS: 0 Filed Weights   12/26/12 1101  Weight: 228 lb 1.6 oz (103.465 kg)   Physical Exam: HEENT:  Sclerae anicteric.  Oropharynx clear and moist. Neck supple.  NODES:  No cervical or supraclavicular lymphadenopathy palpated.  BREAST EXAM:  Right breast is status post lumpectomy. No suspicious nodularities or skin changes, and no evidence of local recurrence. Left breast is unremarkable. Axillae are benign bilaterally, no peripheral lymphadenopathy. LUNGS:  Clear to auscultation bilaterally.  No wheezes or rhonchi HEART:  Regular rate and rhythm. No  murmur appreciated. ABDOMEN:  Soft, obese, nontender.  Positive bowel sounds.  MSK:  No focal spinal tenderness to palpation. Good range of motion bilaterally in the upper extremities. No joint swelling. EXTREMITIES:  No peripheral edema.   SKIN:  Benign.  No evidence of skin rashes. No nail dyscrasia. NEURO:  Nonfocal. Well oriented.  Positive affect.    LAB RESULTS: Lab Results  Component Value Date   WBC 8.5 12/20/2012   NEUTROABS 5.0 12/20/2012   HGB 12.3 12/20/2012   HCT 36.6 12/20/2012   MCV 87.8 12/20/2012   PLT 309 12/20/2012      Chemistry      Component Value Date/Time   NA 143 12/20/2012 1128   NA 140 08/10/2011 1556   K 4.1 12/20/2012 1128   K 3.7 08/10/2011 1556   CL 104 06/20/2012 1421   CL 102 08/10/2011 1556   CO2 25 12/20/2012 1128   CO2 28 08/10/2011 1556   BUN 17.9 12/20/2012 1128   BUN 25* 08/10/2011 1556   CREATININE 0.8 12/20/2012 1128   CREATININE 1.00 08/10/2011 1556      Component Value Date/Time   CALCIUM 10.0 12/20/2012 1128   CALCIUM 9.7 08/10/2011 1556   ALKPHOS 84 12/20/2012 1128   ALKPHOS 75 08/10/2011 1556   AST 18 12/20/2012 1128   AST 19 08/10/2011 1556   ALT 13 12/20/2012 1128   ALT 16 08/10/2011 1556   BILITOT 1.14 12/20/2012 1128   BILITOT 0.7 08/10/2011 1556       STUDIES:  Mm Digital Diagnostic Bilat  06/12/2012   *RADIOLOGY REPORT*  Clinical Data:  History of right breast cancer status post lumpectomy 2013  DIGITAL DIAGNOSTIC BILATERAL MAMMOGRAM WITH CAD  Comparison: With priors  Findings:  ACR Breast Density Category 1: The breast tissue is almost entirely fatty.  Lumpectomy changes are seen in the right breast.  There is no suspicious mass or malignant-type microcalcifications in either breast.  Mammographic images  were processed with CAD.  IMPRESSION: No evidence of malignancy in either breast.  RECOMMENDATION: Bilateral diagnostic mammogram in 1 year is recommended.  I have discussed the findings and recommendations with the  patient. Results were also provided in writing at the conclusion of the visit.  If applicable, a reminder letter will be sent to the patient regarding her next appointment.  BI-RADS CATEGORY 2:  Benign finding(s).   Original Report Authenticated By: Baird Lyons, M.D.    Most recent bone density on 09/07/2011 was normal.   ASSESSMENT: 79 y.o. [place] woman   (1)  s/p Right lumpectomy and sentinel lymph node sampling 06/16/2011 for a T1c N0, stage IA invasive lobular breast cancer, grade 1, strongly estrogen and progesterone positive, with no HER-2 amplification, and an Mib-1 of 12%  (2) started anastrozole July 2013, briefly discontinued because of side effects but resumed October 2013.  PLAN:  Rachael [name] appears to be doing very well with regards to her breast cancer, and I'm making no changes in her regimen today. She will continue on anastrozole, the plan being to continue for total of 5 years (until July 2018). She is due for her next mammogram and bone density in June 2015, and will see Dr. Darnelle Catalan soon thereafter for routine followup. If she is still doing well at that point, we will likely begin annual visits.  All this was reviewed in detail with Rachael [name] who voices understanding and agreement with our plan. She knows to call for any problems that may develop before the next visit.  Jaree Trinka PA-C   12/26/2012

## 2013-05-18 ENCOUNTER — Telehealth: Payer: Self-pay | Admitting: Internal Medicine

## 2013-05-18 ENCOUNTER — Ambulatory Visit (INDEPENDENT_AMBULATORY_CARE_PROVIDER_SITE_OTHER): Payer: Medicare HMO | Admitting: Family Medicine

## 2013-05-18 ENCOUNTER — Encounter: Payer: Self-pay | Admitting: Family Medicine

## 2013-05-18 VITALS — BP 140/74 | HR 98 | Temp 98.2°F | Ht 68.0 in | Wt 232.0 lb

## 2013-05-18 DIAGNOSIS — L259 Unspecified contact dermatitis, unspecified cause: Secondary | ICD-10-CM

## 2013-05-18 MED ORDER — METHYLPREDNISOLONE ACETATE 80 MG/ML IJ SUSP
120.0000 mg | Freq: Once | INTRAMUSCULAR | Status: AC
  Administered 2013-05-18: 120 mg via INTRAMUSCULAR

## 2013-05-18 MED ORDER — PREDNISONE (PAK) 10 MG PO TABS: ORAL_TABLET | ORAL | Status: DC

## 2013-05-18 NOTE — Addendum Note (Signed)
Addended by: Aggie Hacker A on: 05/18/2013 12:01 PM   Modules accepted: Orders

## 2013-05-18 NOTE — Progress Notes (Signed)
   Subjective:    Patient ID: Rachael [name], female    DOB: 05/10/1933, 80 y.o.   MRN: [id number]  HPI Here for 4 days of itchy rash over the arms and legs. Using Benadryl and cortisone cream.    Review of Systems  Constitutional: Negative.   Respiratory: Negative.   Skin: Positive for rash.       Objective:   Physical Exam  Constitutional: She appears well-developed and well-nourished.  Cardiovascular: Normal rate, regular rhythm, normal heart sounds and intact distal pulses.   Pulmonary/Chest: Effort normal and breath sounds normal.  Skin:  Red papules and macules as above          Assessment & Plan:  Add Zantac 150 mg bid.

## 2013-05-18 NOTE — Telephone Encounter (Signed)
Pt would like a call back concerning a rx  That she picked up today  Predisone. She is not clear on when to start it pharmacy told her to start today but she said Dr Sarajane Jews told her tomorrow

## 2013-05-18 NOTE — Telephone Encounter (Signed)
Per Dr. Sarajane Jews, pt should start medication tomorrow and I spoke with pt.

## 2013-05-18 NOTE — Progress Notes (Signed)
Pre visit review using our clinic review tool, if applicable. No additional management support is needed unless otherwise documented below in the visit note. 

## 2013-05-21 ENCOUNTER — Other Ambulatory Visit (HOSPITAL_COMMUNITY): Payer: Self-pay | Admitting: Cardiology

## 2013-05-21 DIAGNOSIS — I6529 Occlusion and stenosis of unspecified carotid artery: Secondary | ICD-10-CM

## 2013-05-25 ENCOUNTER — Ambulatory Visit (HOSPITAL_COMMUNITY): Payer: Medicare HMO | Attending: Cardiology | Admitting: Cardiology

## 2013-05-25 DIAGNOSIS — I6529 Occlusion and stenosis of unspecified carotid artery: Secondary | ICD-10-CM | POA: Insufficient documentation

## 2013-05-25 NOTE — Progress Notes (Signed)
Carotid duplex performed 

## 2013-06-05 ENCOUNTER — Telehealth: Payer: Self-pay | Admitting: *Deleted

## 2013-06-05 NOTE — Telephone Encounter (Signed)
Called pt and informed her of why we needed to move her appts and she was fine with that.  Confirmed 07/24/13 lab appt & 08/01/13 [facility] appt w/ pt.

## 2013-06-06 ENCOUNTER — Encounter: Payer: Self-pay | Admitting: Internal Medicine

## 2013-06-06 ENCOUNTER — Ambulatory Visit (HOSPITAL_COMMUNITY): Payer: Medicare HMO | Attending: Cardiovascular Disease | Admitting: Cardiology

## 2013-06-06 ENCOUNTER — Ambulatory Visit (INDEPENDENT_AMBULATORY_CARE_PROVIDER_SITE_OTHER): Payer: Medicare HMO | Admitting: Internal Medicine

## 2013-06-06 VITALS — BP 120/62 | HR 54 | Temp 98.3°F | Wt 233.0 lb

## 2013-06-06 DIAGNOSIS — R609 Edema, unspecified: Secondary | ICD-10-CM

## 2013-06-06 DIAGNOSIS — M7989 Other specified soft tissue disorders: Secondary | ICD-10-CM

## 2013-06-06 NOTE — Assessment & Plan Note (Signed)
Chronic peripheral dep edema Prior compression hose no longer fit with international weight loss Check doppler given increase R>L ankle in past ankle r/o DVT If negative, continue diuretics and compression hose

## 2013-06-06 NOTE — Progress Notes (Signed)
Pre visit review using our clinic review tool, if applicable. No additional management support is needed unless otherwise documented below in the visit note. 

## 2013-06-06 NOTE — Progress Notes (Signed)
Lower venous duplex bilateral complete 

## 2013-06-06 NOTE — Progress Notes (Signed)
Subjective:    Patient ID: Rachael [name], female    DOB: [birth year], 80 y.o.   MRN: [id number]  HPI  Patient is here for ankle swelling - Reviewed chronic medical issues and interval medical events  Past Medical History  Diagnosis Date  . Chronic venous insufficiency   . PVD (peripheral vascular disease)     mild carotid 11/05  . Osteoarthritis     bilat hips, severe  . Obesity   . CONTACT DERMATITIS   . GLUCOSE INTOLERANCE   . VITAMIN D DEFICIENCY   . Edema     pt  states had edema of feet and legs has been on lasix per dr. to help  . CAROTID STENOSIS   . DUPUYTREN'S CONTRACTURE   . MENOPAUSAL SYNDROME   . HLD (hyperlipidemia)   . Hypothyroidism     Dr. Ala Dach in [place]   . Breast cancer     R    Review of Systems  Constitutional: Negative for fever and fatigue.  Respiratory: Negative for cough and shortness of breath.   Cardiovascular: Positive for leg swelling (B ankles R>L x 5 days, no injury or medication change). Negative for chest pain.       Objective:   Physical Exam  BP 120/62  Pulse 54  Temp(Src) 98.3 F (36.8 C) (Oral)  Wt 233 lb (105.688 kg)  SpO2 95% Wt Readings from Last 3 Encounters:  06/06/13 233 lb (105.688 kg)  05/18/13 232 lb (105.235 kg)  12/26/12 228 lb 1.6 oz (103.465 kg)   Constitutional: She appears well-developed and well-nourished. No distress.  Neck: Normal range of motion. Neck supple. No JVD present. No thyromegaly present.  Cardiovascular: Normal rate, regular rhythm and normal heart sounds.  No murmur heard. 1+ R>L dep ankle edema. Pulmonary/Chest: Effort normal and breath sounds normal. No respiratory distress. She has no wheezes.  MSkel: no gross deformity - FROM, nontender ankles Skin: chronic venous changes BLE distal, no wound or ulcer - B varicose veins  Psychiatric: She has a normal mood and affect. Her behavior is normal. Judgment and thought content normal.   Lab Results  Component Value Date   WBC  8.5 12/20/2012   HGB 12.3 12/20/2012   HCT 36.6 12/20/2012   PLT 309 12/20/2012   GLUCOSE 97 12/20/2012   CHOL 185 10/04/2012   TRIG 103.0 10/04/2012   HDL 54.30 10/04/2012   LDLDIRECT 115.8 11/03/2011   LDLCALC 110* 10/04/2012   ALT 13 12/20/2012   AST 18 12/20/2012   NA 143 12/20/2012   K 4.1 12/20/2012   CL 104 06/20/2012   CREATININE 0.8 12/20/2012   BUN 17.9 12/20/2012   CO2 25 12/20/2012   TSH 0.46 06/23/2012   INR 0.98 08/13/2010   HGBA1C 6.0 11/03/2011    Ct Abdomen Pelvis W Contrast  06/13/2012   *RADIOLOGY REPORT*  Clinical Data: Bulging of the right lower abdominal wall, prior umbilical hernia repair and cholecystectomy, history of right breast cancer status post lumpectomy  CT ABDOMEN AND PELVIS WITH CONTRAST  Technique:  Multidetector CT imaging of the abdomen and pelvis was performed following the standard protocol during bolus administration of intravenous contrast.  BUN and creatinine were obtained on site at [place] at [address]  Results: BUN 18 mg/dL, Creatinine 0.9 mg/dL.  Contrast:  125 ml Omnipaque-300 IV  Comparison: 07/10/2010  Findings: Mild subpleural reticulation at the lung bases.  Tiny hepatic cyst in the left hepatic dome (series 2/image 15).  Spleen, pancreas, and adrenal glands are within normal limits.  Status post cholecystectomy.  No intrahepatic or extrahepatic ductal dilatation.  Bilateral renal cysts, measuring up to 3.9 x 3.6 cm in the medial right upper pole and 4.8 x 4.9 cm in the left lower pole.  No hydronephrosis.  No evidence of bowel obstruction.  Atherosclerotic calcifications of the abdominal aorta and branch vessels.  No abdominopelvic ascites.  No suspicious abdominopelvic lymphadenopathy.  Status post hysterectomy.  No adnexal masses.  Bladder is partially obscured by streak artifact but grossly unremarkable.  Prior ventral hernia repair.  Palpable abnormality in the right lower abdominal wall is marked by a vitamin E capsule  (series 2/image 50), corresponding to the site of prior right lateral abdominal wall hernia, without residual/recurrent hernia seen.  Degenerative changes of the visualized thoracolumbar spine. Bilateral total hip arthroplasties.  IMPRESSION:  No residual/recurrent hernia in the right lower abdominal wall, at the site of prior hernia repair, corresponding to the area of clinical concern.  Prior ventral hernia repair, cholecystectomy, and hysterectomy.  Bilateral renal cysts.   Original Report Authenticated By: Julian Hy, M.D.       Assessment & Plan:   Problem List Items Addressed This Visit   EDEMA     Chronic peripheral dep edema Prior compression hose no longer fit with international weight loss Check doppler given increase R>L ankle in past ankle r/o DVT If negative, continue diuretics and compression hose    Relevant Orders      US Venous Img Lower Bilateral    Other Visit Diagnoses   Leg swelling    -  Primary    Relevant Orders       US Venous Img Lower Bilateral

## 2013-06-06 NOTE — Addendum Note (Signed)
Addended by: Gwendolyn Grant A on: 06/06/2013 01:59 PM   Modules accepted: Orders

## 2013-06-06 NOTE — Patient Instructions (Signed)
It was good to see you today.  We have reviewed your prior records including labs and tests today  Will schedule for doppler to evaluate for leg swelling today. Your results will be released to [facility] (or called to you) after review, usually within 72hours after test completion. If any changes need to be made, you will be notified at that same time.  Medications reviewed and updated, no changes recommended at this time.  Continue compression hose as discussed  Please schedule followup in 3-4 months, call sooner if problems.

## 2013-06-18 ENCOUNTER — Other Ambulatory Visit: Payer: Medicare Other

## 2013-06-25 ENCOUNTER — Ambulatory Visit: Payer: Medicare Other | Admitting: Oncology

## 2013-07-09 ENCOUNTER — Ambulatory Visit
Admission: RE | Admit: 2013-07-09 | Discharge: 2013-07-09 | Disposition: A | Discharge status: Home / Self Care | Payer: Medicare HMO | Source: Ambulatory Visit | Attending: Physician Assistant | Admitting: Physician Assistant

## 2013-07-09 DIAGNOSIS — Z780 Asymptomatic menopausal state: Secondary | ICD-10-CM

## 2013-07-09 DIAGNOSIS — Z853 Personal history of malignant neoplasm of breast: Secondary | ICD-10-CM

## 2013-07-23 ENCOUNTER — Other Ambulatory Visit: Payer: Self-pay | Admitting: *Deleted

## 2013-07-23 DIAGNOSIS — C50911 Malignant neoplasm of unspecified site of right female breast: Secondary | ICD-10-CM

## 2013-07-23 DIAGNOSIS — E559 Vitamin D deficiency, unspecified: Secondary | ICD-10-CM

## 2013-07-24 ENCOUNTER — Other Ambulatory Visit (HOSPITAL_BASED_OUTPATIENT_CLINIC_OR_DEPARTMENT_OTHER): Payer: Medicare HMO

## 2013-07-24 DIAGNOSIS — E559 Vitamin D deficiency, unspecified: Secondary | ICD-10-CM

## 2013-07-24 DIAGNOSIS — C50919 Malignant neoplasm of unspecified site of unspecified female breast: Secondary | ICD-10-CM

## 2013-07-24 DIAGNOSIS — C50911 Malignant neoplasm of unspecified site of right female breast: Secondary | ICD-10-CM

## 2013-07-24 LAB — COMPREHENSIVE METABOLIC PANEL (CC13)
ALT: 12 U/L (ref 0–55)
ANION GAP: 9 meq/L (ref 3–11)
AST: 17 U/L (ref 5–34)
Albumin: 3.6 g/dL (ref 3.5–5.0)
Alkaline Phosphatase: 75 U/L (ref 40–150)
BILIRUBIN TOTAL: 1.07 mg/dL (ref 0.20–1.20)
BUN: 28.4 mg/dL — AB (ref 7.0–26.0)
CALCIUM: 10 mg/dL (ref 8.4–10.4)
CHLORIDE: 105 meq/L (ref 98–109)
CO2: 27 mEq/L (ref 22–29)
CREATININE: 1 mg/dL (ref 0.6–1.1)
Glucose: 120 mg/dl (ref 70–140)
Potassium: 3.9 mEq/L (ref 3.5–5.1)
Sodium: 141 mEq/L (ref 136–145)
Total Protein: 6.6 g/dL (ref 6.4–8.3)

## 2013-07-24 LAB — CBC WITH DIFFERENTIAL/PLATELET
BASO%: 0.7 % (ref 0.0–2.0)
BASOS ABS: 0.1 10*3/uL (ref 0.0–0.1)
EOS%: 3 % (ref 0.0–7.0)
Eosinophils Absolute: 0.2 10*3/uL (ref 0.0–0.5)
HEMATOCRIT: 39.5 % (ref 34.8–46.6)
HEMOGLOBIN: 13 g/dL (ref 11.6–15.9)
LYMPH#: 2.2 10*3/uL (ref 0.9–3.3)
LYMPH%: 26.8 % (ref 14.0–49.7)
MCH: 29.2 pg (ref 25.1–34.0)
MCHC: 32.9 g/dL (ref 31.5–36.0)
MCV: 89 fL (ref 79.5–101.0)
MONO#: 0.9 10*3/uL (ref 0.1–0.9)
MONO%: 10.6 % (ref 0.0–14.0)
NEUT#: 4.7 10*3/uL (ref 1.5–6.5)
NEUT%: 58.9 % (ref 38.4–76.8)
PLATELETS: 270 10*3/uL (ref 145–400)
RBC: 4.44 10*6/uL (ref 3.70–5.45)
RDW: 13.9 % (ref 11.2–14.5)
WBC: 8 10*3/uL (ref 3.9–10.3)

## 2013-07-25 LAB — VITAMIN D 25 HYDROXY (VIT D DEFICIENCY, FRACTURES): Vit D, 25-Hydroxy: 43 ng/mL (ref 30–89)

## 2013-07-31 ENCOUNTER — Telehealth: Payer: Self-pay | Admitting: Cardiology

## 2013-07-31 NOTE — Telephone Encounter (Signed)
Spoke with pt, her legs are so swollen by the evening they are cracking open and bleeding. She denies SOB and needs a new prescription for support hose since she has lost weight. She requested a sooner appt than September. Follow up scheduled

## 2013-07-31 NOTE — Telephone Encounter (Signed)
New problem:     Pt called to get a sooner appt, offered PA /NP pt refused wants Dr Percival Spanish.   Per pt legs swelling to the point of bleeding need sooner.  This has been getting worse for a month.

## 2013-08-01 ENCOUNTER — Ambulatory Visit (HOSPITAL_BASED_OUTPATIENT_CLINIC_OR_DEPARTMENT_OTHER): Payer: Medicare HMO | Admitting: Oncology

## 2013-08-01 ENCOUNTER — Telehealth: Payer: Self-pay | Admitting: Oncology

## 2013-08-01 VITALS — BP 140/54 | HR 63 | Temp 98.5°F | Resp 20 | Ht 68.0 in | Wt 239.0 lb

## 2013-08-01 DIAGNOSIS — M8580 Other specified disorders of bone density and structure, unspecified site: Secondary | ICD-10-CM

## 2013-08-01 DIAGNOSIS — C50319 Malignant neoplasm of lower-inner quadrant of unspecified female breast: Secondary | ICD-10-CM

## 2013-08-01 DIAGNOSIS — C50919 Malignant neoplasm of unspecified site of unspecified female breast: Secondary | ICD-10-CM

## 2013-08-01 DIAGNOSIS — Z170 Estrogen receptor positive status [ER+]: Secondary | ICD-10-CM

## 2013-08-01 NOTE — Telephone Encounter (Signed)
per pof to sch pt BD & appt-gave pt time & date-sch gave pt copy of sch

## 2013-08-01 NOTE — Progress Notes (Signed)
ID: Rachael [name]   DOB: [birth year]  MR#: [id number]  BJS#:[id number]  PCP: Gwendolyn Grant, MD GYN: Dian Queen, MD SU: Autumn Messing, MD OTHER MD:  Minus Breeding, MD;  Nena Polio, MD; Thea Silversmith, MD;  Lucio Edward, MD  CHIEF COMPLAINT:  Right Breast Cancer  CURRENT TREATMENT: Anastrozole   HISTORY OF PRESENT ILLNESS: From the original intake note:  Rachael [name] had routine screening mammography at [facility] 05/06/2011, showing an area of focal asymmetry in the lower inner quadrant of the right breast. Additional views 05/10/2011 confirmed a 9 mm area of irregularity without associated microcalcifications. Ultrasound of the right breast confirmed a 9 mm hypoechoic mass with posterior acoustic shadowing. Biopsy of this mass was performed the next day, and showed ([id number]) and invasive lobular carcinoma, E-cadherin negative, which was estrogen receptor positive at 97%, and progesterone receptor positive at 81%. There was no HER-2 amplification, and MIB-1 was 12%.  On may 07/31/2011 him of the patient underwent breast MRI, which showed a 1.5 cm mildly irregular enhancing mass in the upper inner quadrant of the right breast. There were no other masses of concern and no abnormal appearing lymph nodes. Accordingly, on 06/16/2011 the patient underwent right lumpectomy and sentinel lymph node sampling under Dr. Star Age, the final pathology (SZ [id number]) showing an invasive lobular carcinoma, grade 1, measuring 1.5 cm, with both sentinel lymph nodes negative.   The patient's subsequent history is as detailed below.  INTERVAL HISTORY: Rachael [name] returns today for followup of her breast cancer. She continues on anastrozole, with excellent tolerance. Her mother, who is nearly 68, and died 2 months ago. She had declined rapidly over the last year and had spent the last several months of her life at [facility]. This was for a stressful "but also wonderful". She was able to give her mother the attention and  she needed and it had some great moments together, she says. Digest itself was peaceful  REVIEW OF SYSTEMS: Rachael [name] goes to the gym 5 times a week, doing water aerobics, and weight. She also walks. She is under a lot less stress now, although of course she is grieving her mother as well. She has some hot flashes from the anastrozole but not something she would want to take a medication for. Occasionally she has mild ankle swelling. She is seeing Dr. Percival Spanish for this. A detailed review of systems today was otherwise noncontributory   PAST MEDICAL HISTORY: Past Medical History  Diagnosis Date  . Chronic venous insufficiency   . PVD (peripheral vascular disease)     mild carotid 11/05  . Osteoarthritis     bilat hips, severe  . Obesity   . CONTACT DERMATITIS   . GLUCOSE INTOLERANCE   . VITAMIN D DEFICIENCY   . Edema     pt  states had edema of feet and legs has been on lasix per dr. to help  . CAROTID STENOSIS   . DUPUYTREN'S CONTRACTURE   . MENOPAUSAL SYNDROME   . HLD (hyperlipidemia)   . Hypothyroidism     Dr. Ala Dach in [place]   . Breast cancer     R    PAST SURGICAL HISTORY: Past Surgical History  Procedure Laterality Date  . 2d echo  11/29/03    normal LV size. normal EF   . Normal caronary arteries      per pt. 2003  . Cholecystectomy    . Inguinal hernia repair    . Tonsillectomy    .  Right rotator cuff surgery    . Total hip arthroplasty  05/2008 and 10/2008    B hip - alusio  . Hernia repair      incisional hernia  . Breast biopsy  05/11/11    right breast  . Breast lumpectomy      right breast  . Back surgery  1983    lumbarectomy  . Finger surgery      FAMILY HISTORY Family History  Problem Relation Age of Onset  . Coronary artery disease      family hx - female 1st degree relative <60  . Hypothyroidism      aunt   . Uterine cancer Mother 79  . Breast cancer Daughter 70    BRCA negative (no BART)  . Pancreatic cancer Maternal Grandfather      diagnosed in his 80s  . Anesthesia problems Neg Hx   . Prostate cancer Maternal Uncle     diagnosed in his 55s  . Colon cancer Cousin   . Prostate cancer Paternal Uncle     diagnosed in his 64s  . Clotting disorder Maternal Grandmother   . Uterine cancer Cousin   . Leukemia Cousin   . Prostate cancer Cousin    the patient's father died at the age of 77 from heart disease. The patient's mother died in 45 at the age of 13. She had uterine cancer in her 86s. There is a significant cancer family on her side, to be detailed when the patient meets with her genetics counselor. The patient herself has no sisters, does have 3 brothers. One of the patient's daughters, Rachael [name], was diagnosed with breast cancer at the age of 64. She was tested for the BRCA gene and was negative. There is no other breast or ovarian cancer in the immediate family to her knowledge.  GYNECOLOGIC HISTORY: Menarche age 7, menopause age 67. The patient tried Prempro briefly but did not like it. She is GX P5, first live birth age 56.  SOCIAL HISTORY:   (Updated December 2014) [name] worked in [employer], currently is developing a [employer]. She is divorced and lives by herself. 3 of her 5 children live in town ([place], [place], and [place]; [name] is also followed here w a history of breast cancer) and 2 live in [place] and [place]). She has 7 grandchildren. She attends the [employer].   ADVANCED DIRECTIVES: Not in place  HEALTH MAINTENANCE:  (Updated December 2014) History  Substance Use Topics  . Smoking status: Never Smoker   . Smokeless tobacco: Never Used  . Alcohol Use: No     Colonoscopy: 2009/[facility]  PAP: Sept 2014/[facility]  B[facility] density: [facility] 09[facility]07[facility], Normal  Lipid panel: Sept 2014/ [facility]    Allergies  Allergen Reactions  . Shellfish Allergy Anaphylaxis and Hives    Hives all over the body.  . Percocet [Oxycodone-Acetaminophen] Hives  . Iodinated  Diagnostic Agents     PT IS NOT AWARE OF IODINE ALLERGY, PREMEDICATED FOR PRIOR PREMEDS ONLY//A.C.  . Iodine Hives    Current Outpatient Prescriptions  Medication Sig Dispense Refill  . anastrozole (ARIMIDEX) 1 MG tablet Take 1 tablet (1 mg total) by mouth daily.  90 tablet  3  . cholecalciferol (VITAMIN D) 1000 UNITS tablet Take 2,000 Units by mouth daily.      . Cyanocobalamin (B-12 PO) Take 1 tablet by mouth daily.       . fluticasone (FLONASE) 50 MCG/ACT nasal spray Place  1 spray into both nostrils daily.  16 g  2  . levothyroxine (SYNTHROID, LEVOTHROID) 137 MCG tablet Take 137 mcg by mouth daily.       . Nutritional Supplements (JUICE PLUS FIBRE) LIQD Take 4 capsules by mouth 3 (three) times daily. 4 vegetable capsules with breakfast, 4 fruit capsules with lunch and 4 berry capsules with dinner.      . Omega-3 350 MG CAPS Take 2 capsules by mouth daily.        Marland Kitchen triamterene-hydrochlorothiazide (DYAZIDE) 37.5-25 MG per capsule Take 1 each (1 capsule total) by mouth every morning.  90 capsule  3   No current facility-administered medications for this visit.    OBJECTIVE: Middle-aged white woman who appears well Filed Vitals:   08/01/13 1515  BP: 140/54  Pulse: 63  Temp: 98.5 F (36.9 C)  Resp: 20     Body mass index is 36.35 kg/(m^2).    ECOG FS: 0 Filed Weights   08/01/13 1515  Weight: 239 lb (108.41 kg)   Sclerae unicteric, pupils equal and reactive Oropharynx clear and moist No cervical or supraclavicular adenopathy Lungs no rales or rhonchi Heart regular rate and rhythm Abd soft, nontender, positive bowel sounds MSK no focal spinal tenderness, no upper extremity lymphedema, no ankle edema Neuro: nonfocal, well oriented, positive affect Breasts: The right breast is status post lumpectomy. There is no evidence of local recurrence. The right axilla is benign to the left breast is unremarkable.  LAB RESULTS: Lab Results  Component Value Date   WBC 8.0 07/24/2013    NEUTROABS 4.7 07/24/2013   HGB 13.0 07/24/2013   HCT 39.5 07/24/2013   MCV 89.0 07/24/2013   PLT 270 07/24/2013      Chemistry      Component Value Date/Time   NA 141 07/24/2013 1241   NA 140 08/10/2011 1556   K 3.9 07/24/2013 1241   K 3.7 08/10/2011 1556   CL 104 06/20/2012 1421   CL 102 08/10/2011 1556   CO2 27 07/24/2013 1241   CO2 28 08/10/2011 1556   BUN 28.4* 07/24/2013 1241   BUN 25* 08/10/2011 1556   CREATININE 1.0 07/24/2013 1241   CREATININE 1.00 08/10/2011 1556      Component Value Date/Time   CALCIUM 10.0 07/24/2013 1241   CALCIUM 9.7 08/10/2011 1556   ALKPHOS 75 07/24/2013 1241   ALKPHOS 75 08/10/2011 1556   AST 17 07/24/2013 1241   AST 19 08/10/2011 1556   ALT 12 07/24/2013 1241   ALT 16 08/10/2011 1556   BILITOT 1.07 07/24/2013 1241   BILITOT 0.7 08/10/2011 1556       STUDIES: Mm Digital Diagnostic Bilat  07/09/2013   CLINICAL DATA:  Status post right lumpectomy for breast cancer two years ago.  EXAM: DIGITAL DIAGNOSTIC  BILATERAL MAMMOGRAM WITH CAD  COMPARISON:  Previous examinations.  ACR Breast Density Category a: The breast tissue is almost entirely fatty.  FINDINGS: Stable post lumpectomy changes on the right. No new findings suspicious for malignancy in either breast.  Mammographic images were processed with CAD.  IMPRESSION: No evidence of malignancy.  RECOMMENDATION: Bilateral diagnostic mammogram in 1 year.  I have discussed the findings and recommendations with the patient. Results were also provided in writing at the conclusion of the visit. If applicable, a reminder letter will be sent to the patient regarding the next appointment.  BI-RADS CATEGORY  2: Benign.   Electronically Signed   By: Enrique Sack M.D.   On: 07/09/2013  13:28   Most recent bone density on 09/07/2011 was normal.   ASSESSMENT: 80 y.o. [place] woman   (1)  s/p Right lumpectomy and sentinel lymph node sampling 06/16/2011 for a T1c N0, stage IA invasive lobular breast cancer, grade 1, strongly  estrogen and progesterone positive, with no HER-2 amplification, and an Mib-1 of 12%  (2) the patient met with radiation oncology 07/14/2011, but opted against adjuvant radiation  (3) started anastrozole July 2013, briefly discontinued because of side effects but resumed October 2013.  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 other genes tested were normal and included APC, ATM, BARD1, BMPR1A, BRCA1, BRCA2, BRIP1, CDH1, CHEK2, EPCAM, MLH1, MRE11A, MSH6, MUTYH, NBN, PALB2, PMS2, PTEN, RAD50, RAD51C, SMAD4, STK11 and TP53  PLAN:  Zaryia is doing remarkably well. Even though she certainly misses her mother, the last year had been very hard and she is under much less stress at present. She is also exercising 5 days a week, which is excellent.  The plan is to continue anastrozole to a total of 5 years. I have set her up for a bone density in September. If there is any significant bone density loss we will consider bisphosphonates.  She has a good understanding of the overall plan. She agrees with this. She knows the goal of treatment in her case is cure. She will call with any problems that may develop before her next visit here, which will be in 8 months.  Chauncey Cruel, MD  08/01/2013

## 2013-08-02 NOTE — Addendum Note (Signed)
Addended by: Prentiss Bells on: 08/02/2013 09:14 AM   Modules accepted: Orders, Medications

## 2013-08-15 ENCOUNTER — Ambulatory Visit (INDEPENDENT_AMBULATORY_CARE_PROVIDER_SITE_OTHER): Payer: Medicare HMO | Admitting: Cardiology

## 2013-08-15 ENCOUNTER — Encounter: Payer: Self-pay | Admitting: Cardiology

## 2013-08-15 VITALS — BP 132/62 | HR 48 | Ht 68.0 in | Wt 233.0 lb

## 2013-08-15 DIAGNOSIS — I6529 Occlusion and stenosis of unspecified carotid artery: Secondary | ICD-10-CM

## 2013-08-15 DIAGNOSIS — R609 Edema, unspecified: Secondary | ICD-10-CM

## 2013-08-15 NOTE — Progress Notes (Signed)
HPI The patient presents for followup of hypertension and lower extremity swelling. She thought earlier this month with some lower externally swelling. However, she says she is since started a "liver cleanse" product and has lost a lot of edema fluid. She says her legs and back to baseline. At about 30 pounds of weight loss. She's exercising routinely. She denies any chest pressure, neck or arm discomfort. She denies any palpitations, presyncope or syncope. She has no PND or orthopnea.  Allergies  Allergen Reactions  . Shellfish Allergy Anaphylaxis and Hives    Hives all over the body.  . Percocet [Oxycodone-Acetaminophen] Hives  . Iodinated Diagnostic Agents     PT IS NOT AWARE OF IODINE ALLERGY, PREMEDICATED FOR PRIOR PREMEDS ONLY//A.C.  . Iodine Hives    Current Outpatient Prescriptions  Medication Sig Dispense Refill  . anastrozole (ARIMIDEX) 1 MG tablet Take 1 tablet (1 mg total) by mouth daily.  90 tablet  3  . cholecalciferol (VITAMIN D) 1000 UNITS tablet Take 2,000 Units by mouth daily.      . Cyanocobalamin (B-12 PO) Take 1 tablet by mouth daily.       Marland Kitchen levothyroxine (SYNTHROID, LEVOTHROID) 137 MCG tablet Take 137 mcg by mouth daily.       . Nutritional Supplements (JUICE PLUS FIBRE) LIQD Take 4 capsules by mouth 3 (three) times daily. 4 vegetable capsules with breakfast, 4 fruit capsules with lunch and 4 berry capsules with dinner.      . triamterene-hydrochlorothiazide (DYAZIDE) 37.5-25 MG per capsule Take 1 each (1 capsule total) by mouth every morning.  90 capsule  3   No current facility-administered medications for this visit.    Past Medical History  Diagnosis Date  . Chronic venous insufficiency   . PVD (peripheral vascular disease)     mild carotid 11/05  . Osteoarthritis     bilat hips, severe  . Obesity   . CONTACT DERMATITIS   . GLUCOSE INTOLERANCE   . VITAMIN D DEFICIENCY   . Edema     pt  states had edema of feet and legs has been on lasix per dr. to  help  . CAROTID STENOSIS   . DUPUYTREN'S CONTRACTURE   . MENOPAUSAL SYNDROME   . HLD (hyperlipidemia)   . Hypothyroidism     Dr. Ala Dach in [place]   . Breast cancer     R    Past Surgical History  Procedure Laterality Date  . 2d echo  11/29/03    normal LV size. normal EF   . Normal caronary arteries      per pt. 2003  . Cholecystectomy    . Inguinal hernia repair    . Tonsillectomy    . Right rotator cuff surgery    . Total hip arthroplasty  05/2008 and 10/2008    B hip - alusio  . Hernia repair      incisional hernia  . Breast biopsy  05/11/11    right breast  . Breast lumpectomy      right breast  . Back surgery  1983    lumbarectomy  . Finger surgery      ROS:  As stated in the HPI and negative for all other systems.  PHYSICAL EXAM BP 132/62  Pulse 48  Ht 5\' 8"  (1.727 m)  Wt 233 lb (105.688 kg)  BMI 35.44 kg/m2 GENERAL:  Well appearing NECK:  No jugular venous distention, waveform within normal limits, carotid upstroke brisk and symmetric, no bruits,  no thyromegaly LYMPHATICS:  No cervical, inguinal adenopathy LUNGS:  Clear to auscultation bilaterally CHEST:  Unremarkable HEART:  PMI not displaced or sustained,S1 and S2 within normal limits, no S3, no S4, no clicks, no rubs, no murmurs ABD:  Flat, positive bowel sounds normal in frequency in pitch, no bruits, no rebound, no guarding, no midline pulsatile mass, no hepatomegaly, no splenomegaly EXT:  2 plus pulses throughout, mild right greater than left edema, no cyanosis no clubbing, chronic venous stasis changes. SKIN:  No rashes no nodules, hyperpigmentation  EKG:  Sinus rhythm, rate 48, axis within normal limits, intervals within normal limits, no acute ST-T wave changes.  08/15/2013   ASSESSMENT AND PLAN  EDEMA:  This is improved.  No change in therapy or further evaluation is needed.  I reviewed the echo from 2009.  I would not suspect CHF as the etiology of her edema.    CAROTID STENOSIS:   This was very mild in May.  No change in therapy is indicated.   HTN:  The blood pressure is at target. No change in medications is indicated. We will continue with therapeutic lifestyle changes (TLC).

## 2013-08-15 NOTE — Patient Instructions (Signed)
Your physician recommends that you schedule a follow-up appointment in: as needed with Dr. Hochrein  

## 2013-09-06 ENCOUNTER — Telehealth: Payer: Self-pay | Admitting: Cardiology

## 2013-09-06 NOTE — Telephone Encounter (Signed)
Was suppose to get a new prescription for support hose and want to know can it be mailed to her. Please call    Thanks

## 2013-09-06 NOTE — Telephone Encounter (Signed)
Returned call to patient no answer.Left message on personal voice mail will send this message to Dr.Hochrein's nurse [place].

## 2013-09-07 NOTE — Telephone Encounter (Signed)
Returning your call. °

## 2013-09-07 NOTE — Telephone Encounter (Signed)
Pt,. Needed to know what compression strenght she needed I told her 20/30 mm hg

## 2013-09-11 ENCOUNTER — Ambulatory Visit: Payer: Medicare HMO | Admitting: Cardiology

## 2013-09-18 ENCOUNTER — Inpatient Hospital Stay: Admission: RE | Admit: 2013-09-18 | Payer: Medicare HMO | Source: Ambulatory Visit

## 2013-09-24 ENCOUNTER — Telehealth: Payer: Self-pay | Admitting: Internal Medicine

## 2013-09-24 ENCOUNTER — Ambulatory Visit
Admission: RE | Admit: 2013-09-24 | Discharge: 2013-09-24 | Disposition: A | Discharge status: Home / Self Care | Payer: Medicare HMO | Source: Ambulatory Visit | Attending: Oncology | Admitting: Oncology

## 2013-09-24 DIAGNOSIS — C50919 Malignant neoplasm of unspecified site of unspecified female breast: Secondary | ICD-10-CM

## 2013-09-24 DIAGNOSIS — M8580 Other specified disorders of bone density and structure, unspecified site: Secondary | ICD-10-CM

## 2013-09-24 NOTE — Telephone Encounter (Signed)
Patient states her endocrinologist, Dr. Ala Dach, faxed [place] lab orders for the patient to have her thyroid checked. She would like to know if we have received those orders and when she can come in to have those labs done. CB# [phone]

## 2013-09-25 ENCOUNTER — Other Ambulatory Visit: Payer: Self-pay | Admitting: Internal Medicine

## 2013-09-25 DIAGNOSIS — E039 Hypothyroidism, unspecified: Secondary | ICD-10-CM

## 2013-09-26 ENCOUNTER — Other Ambulatory Visit (INDEPENDENT_AMBULATORY_CARE_PROVIDER_SITE_OTHER): Payer: Medicare HMO

## 2013-09-26 DIAGNOSIS — E039 Hypothyroidism, unspecified: Secondary | ICD-10-CM

## 2013-09-27 LAB — T4, FREE: Free T4: 1.32 ng/dL (ref 0.60–1.60)

## 2013-09-27 LAB — TSH: TSH: 0.71 u[IU]/mL (ref 0.35–4.50)

## 2013-09-27 LAB — T3, FREE: T3, Free: 2.8 pg/mL (ref 2.3–4.2)

## 2013-10-02 NOTE — Telephone Encounter (Signed)
Lab results sent to [facility]  T4, T3 and TSH labs results were sent.

## 2013-10-08 ENCOUNTER — Telehealth: Payer: Self-pay | Admitting: Internal Medicine

## 2013-10-08 ENCOUNTER — Other Ambulatory Visit: Payer: Self-pay | Admitting: Obstetrics and Gynecology

## 2013-10-08 NOTE — Telephone Encounter (Signed)
Pt came by office to request bone density results explained to her. Pt states she has into received a response from [facility] since she had this done. Please contact pt when request is reviewed.

## 2013-10-08 NOTE — Telephone Encounter (Signed)
explained BD results to pt.

## 2013-10-09 LAB — CYTOLOGY - PAP

## 2013-10-26 ENCOUNTER — Other Ambulatory Visit: Payer: Self-pay

## 2013-11-05 ENCOUNTER — Telehealth: Payer: Self-pay | Admitting: Nutrition

## 2013-11-05 NOTE — Telephone Encounter (Signed)
Returned patients call Left message 

## 2013-12-24 ENCOUNTER — Telehealth: Payer: Self-pay | Admitting: *Deleted

## 2013-12-24 NOTE — Telephone Encounter (Signed)
Pt called to state onset of " achy legs " noted onset x 1 week with increase " throbbing that woke me up last night ".  Rachael [name] is inquiring if the arimidex could be causing these symptoms.  This RN discussed possible SE of medication and reviewed OTC strategies that could be of benefit.  Rachael [name] states " I just took 2 aleve ".  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  Plan at present is she will see if aleve is of benefit and call if symptoms are not relieved.

## 2014-02-22 ENCOUNTER — Ambulatory Visit (INDEPENDENT_AMBULATORY_CARE_PROVIDER_SITE_OTHER): Payer: PPO | Admitting: Family

## 2014-02-22 ENCOUNTER — Encounter: Payer: Self-pay | Admitting: Family

## 2014-02-22 VITALS — BP 128/54 | HR 61 | Temp 98.3°F | Resp 18 | Ht 68.0 in | Wt 237.0 lb

## 2014-02-22 DIAGNOSIS — Z23 Encounter for immunization: Secondary | ICD-10-CM

## 2014-02-22 DIAGNOSIS — I872 Venous insufficiency (chronic) (peripheral): Secondary | ICD-10-CM

## 2014-02-22 DIAGNOSIS — S81801A Unspecified open wound, right lower leg, initial encounter: Secondary | ICD-10-CM

## 2014-02-22 NOTE — Assessment & Plan Note (Signed)
Minor open wound of the tibia noted. Wound is clean dry and intact with no signs of infection. Wound was cleansed with soap and water in the office and redressed. Continue basic wound care. Follow-up if symptoms worsen or fail to improve.

## 2014-02-22 NOTE — Progress Notes (Signed)
   Subjective:    Patient ID: Rachael [name], female    DOB: 1933-07-31, 81 y.o.   MRN: [id number]  Chief Complaint  Patient presents with  . Leg Pain    Cut right leg with a thing used to help get boots off, said she went to the ER and the nurse said that there was nothing they could do about it, happend last night around 10    HPI:  Rachael [name] is a 81 y.o. female who presents today for an acute visit.  This is a new problem. Associated symptom of a laceration started last night while attempting to put on boots. Wound is located on the right anterior shin. Was seen in the ED and told there is nothing that was needed. Modifying factor of neosporin has helped with the pain and since that time the bleeding has stopped. Concerned for infection.    Allergies  Allergen Reactions  . Shellfish Allergy Anaphylaxis and Hives    Hives all over the body.  . Percocet [Oxycodone-Acetaminophen] Hives  . Iodinated Diagnostic Agents     PT IS NOT AWARE OF IODINE ALLERGY, PREMEDICATED FOR PRIOR PREMEDS ONLY//A.C.  . Iodine Hives    Current Outpatient Prescriptions on File Prior to Visit  Medication Sig Dispense Refill  . anastrozole (ARIMIDEX) 1 MG tablet Take 1 tablet (1 mg total) by mouth daily. 90 tablet 3  . cholecalciferol (VITAMIN D) 1000 UNITS tablet Take 2,000 Units by mouth daily.    . Cyanocobalamin (B-12 PO) Take 1 tablet by mouth daily.     Marland Kitchen levothyroxine (SYNTHROID, LEVOTHROID) 137 MCG tablet Take 137 mcg by mouth daily.     . Nutritional Supplements (JUICE PLUS FIBRE) LIQD Take 4 capsules by mouth 3 (three) times daily. 4 vegetable capsules with breakfast, 4 fruit capsules with lunch and 4 berry capsules with dinner.    . triamterene-hydrochlorothiazide (DYAZIDE) 37.5-25 MG per capsule Take 1 each (1 capsule total) by mouth every morning. 90 capsule 3   No current facility-administered medications on file prior to visit.    Review of Systems  Constitutional: Negative for fever  and chills.  Skin: Positive for wound.      Objective:    BP 128/54 mmHg  Pulse 61  Temp(Src) 98.3 F (36.8 C) (Oral)  Resp 18  Ht 5\' 8"  (1.727 m)  Wt 237 lb (107.502 kg)  BMI 36.04 kg/m2  SpO2 96% Nursing note and vital signs reviewed.  Physical Exam  Constitutional: She is oriented to person, place, and time. She appears well-developed and well-nourished. No distress.  Cardiovascular: Normal rate, regular rhythm, normal heart sounds and intact distal pulses.   Pulmonary/Chest: Effort normal and breath sounds normal.  Neurological: She is alert and oriented to person, place, and time.  Skin: Skin is warm and dry.   1.5 x 1.5 abrasion of right shin noted. No discharge or redness.   Psychiatric: She has a normal mood and affect. Her behavior is normal. Judgment and thought content normal.       Assessment & Plan:

## 2014-02-22 NOTE — Patient Instructions (Addendum)
Thank you for choosing [facility].  Summary/Instructions:  Please clean with soap and water. Neosporin as needed.    If your symptoms worsen or fail to improve, please contact our office for further instruction, or in case of emergency go directly to the emergency room at the closest medical facility.

## 2014-02-22 NOTE — Progress Notes (Signed)
Pre visit review using our clinic review tool, if applicable. No additional management support is needed unless otherwise documented below in the visit note. 

## 2014-02-22 NOTE — Addendum Note (Signed)
Addended by: Delice Bison E on: 02/22/2014 04:50 PM   Modules accepted: Orders

## 2014-02-25 ENCOUNTER — Telehealth: Payer: Self-pay | Admitting: Internal Medicine

## 2014-02-25 NOTE — Telephone Encounter (Signed)
Called pt and make her aware.  She stated she would call back if any sign of infection occur

## 2014-02-25 NOTE — Telephone Encounter (Signed)
Pt called in and said that on Friday she was hear for wound on her leg.  She said that it is draining quite a bit.  It is clear but it is draining so much it running though bandage and into her shoe.  Is the normal?

## 2014-02-25 NOTE — Telephone Encounter (Signed)
Yes, this can be normal. As long as the wound is not showing any evidence of infection (redness, swelling, pus). I recommend adding an absorptive gauze on top to help. If she continues to have concerns we'd be happy to see her back.

## 2014-03-01 ENCOUNTER — Other Ambulatory Visit: Payer: Self-pay | Admitting: *Deleted

## 2014-03-01 DIAGNOSIS — I872 Venous insufficiency (chronic) (peripheral): Secondary | ICD-10-CM

## 2014-03-12 ENCOUNTER — Other Ambulatory Visit: Payer: Self-pay | Admitting: *Deleted

## 2014-03-12 DIAGNOSIS — Z853 Personal history of malignant neoplasm of breast: Secondary | ICD-10-CM

## 2014-03-12 MED ORDER — ANASTROZOLE 1 MG PO TABS: 1.0000 mg | ORAL_TABLET | Freq: Every day | ORAL | Status: DC

## 2014-03-14 ENCOUNTER — Encounter: Payer: Self-pay | Admitting: Family

## 2014-03-14 ENCOUNTER — Ambulatory Visit (INDEPENDENT_AMBULATORY_CARE_PROVIDER_SITE_OTHER): Payer: PPO | Admitting: Family

## 2014-03-14 VITALS — BP 136/58 | HR 73 | Temp 98.0°F | Resp 18 | Wt 237.0 lb

## 2014-03-14 DIAGNOSIS — S81801D Unspecified open wound, right lower leg, subsequent encounter: Secondary | ICD-10-CM

## 2014-03-14 MED ORDER — SULFAMETHOXAZOLE-TRIMETHOPRIM 800-160 MG PO TABS: 1.0000 | ORAL_TABLET | Freq: Two times a day (BID) | ORAL | Status: DC

## 2014-03-14 NOTE — Patient Instructions (Signed)
Thank you for choosing [facility].  Summary/Instructions:  Your prescription(s) have been submitted to your pharmacy or been printed and provided for you. Please take as directed and contact our office if you believe you are having problem(s) with the medication(s) or have any questions.  If your symptoms worsen or fail to improve, please contact our office for further instruction, or in case of emergency go directly to the emergency room at the closest medical facility.   Please keep the wound clean with soap and water. Recommend keeping it covered at this time to promote healing. May use antibiotic cream as needed.

## 2014-03-14 NOTE — Assessment & Plan Note (Signed)
Wound continues to heal slowly. Continue basic wound care. Given increase in edema and redness will treat with Bactrim. Wound was cleaned and dressed with a basic dressing. Continue to keep legs elevated. Anticipate difficulty in wound healing secondary to venous insufficiency. Follow up if symptoms worsen or fail to improve.

## 2014-03-14 NOTE — Progress Notes (Signed)
Pre visit review using our clinic review tool, if applicable. No additional management support is needed unless otherwise documented below in the visit note. 

## 2014-03-14 NOTE — Progress Notes (Signed)
   Subjective:    Patient ID: Rachael [name], female    DOB: 03-02-1933, 81 y.o.   MRN: [id number]  Chief Complaint  Patient presents with  . Follow-up    Right leg where she cut it is red, swollen and puffy, the laceration started back bleeding this morning, the swelling goes all the way to her right foot, swelling started getting bad yesterday afternoon    HPI:  Rachael [name] is a 81 y.o. female who presents today for follow up.   Patient was recently seen for a wound to her right leg which she obtained while putting a boot on. Now experiencing the associated symptoms of redness and edema and new onset bleeding. Has been using soap and water and keeping the wound covered and elevated. Has not increased edema in both legs.   Allergies  Allergen Reactions  . Shellfish Allergy Anaphylaxis and Hives    Hives all over the body.  . Percocet [Oxycodone-Acetaminophen] Hives  . Iodinated Diagnostic Agents     PT IS NOT AWARE OF IODINE ALLERGY, PREMEDICATED FOR PRIOR PREMEDS ONLY//A.C.  . Iodine Hives    Current Outpatient Prescriptions on File Prior to Visit  Medication Sig Dispense Refill  . anastrozole (ARIMIDEX) 1 MG tablet Take 1 tablet (1 mg total) by mouth daily. 90 tablet 3  . anastrozole (ARIMIDEX) 1 MG tablet Take 1 tablet (1 mg total) by mouth daily. 30 tablet 3  . cholecalciferol (VITAMIN D) 1000 UNITS tablet Take 2,000 Units by mouth daily.    . Cyanocobalamin (B-12 PO) Take 1 tablet by mouth daily.     Marland Kitchen levothyroxine (SYNTHROID, LEVOTHROID) 137 MCG tablet Take 137 mcg by mouth daily.     . Nutritional Supplements (JUICE PLUS FIBRE) LIQD Take 4 capsules by mouth 3 (three) times daily. 4 vegetable capsules with breakfast, 4 fruit capsules with lunch and 4 berry capsules with dinner.    . triamterene-hydrochlorothiazide (DYAZIDE) 37.5-25 MG per capsule Take 1 each (1 capsule total) by mouth every morning. 90 capsule 3   No current facility-administered medications on file  prior to visit.    Review of Systems  Constitutional: Negative for fever and chills.  Cardiovascular: Positive for leg swelling.  Skin: Positive for wound.      Objective:    BP 136/58 mmHg  Pulse 73  Temp(Src) 98 F (36.7 C) (Oral)  Resp 18  Wt 237 lb (107.502 kg)  SpO2 97% Nursing note and vital signs reviewed.  Physical Exam  Constitutional: She is oriented to person, place, and time. She appears well-developed and well-nourished. No distress.  Cardiovascular: Normal rate, regular rhythm, normal heart sounds and intact distal pulses.   Pulmonary/Chest: Effort normal and breath sounds normal.  Neurological: She is alert and oriented to person, place, and time.  Skin: Skin is warm and dry.  Bilateral legs appear edematous with 2+ pitting edema. Wound appears with adequate healing. Mild amount of serosanguineous drainage from lower portion of wound present. No evidence of pus. Increased warmth and slight redness present.  Psychiatric: She has a normal mood and affect. Her behavior is normal. Judgment and thought content normal.       Assessment & Plan:

## 2014-03-18 ENCOUNTER — Encounter (HOSPITAL_COMMUNITY): Payer: Self-pay | Admitting: Emergency Medicine

## 2014-03-18 ENCOUNTER — Inpatient Hospital Stay (HOSPITAL_COMMUNITY)
Admission: EM | Admit: 2014-03-18 | Discharge: 2014-03-24 | DRG: 603 | Disposition: A | Discharge status: Home Health | Payer: PPO | Attending: Internal Medicine | Admitting: Internal Medicine

## 2014-03-18 ENCOUNTER — Ambulatory Visit (INDEPENDENT_AMBULATORY_CARE_PROVIDER_SITE_OTHER): Payer: PPO | Admitting: Family Medicine

## 2014-03-18 ENCOUNTER — Emergency Department (HOSPITAL_COMMUNITY): Payer: PPO

## 2014-03-18 ENCOUNTER — Encounter: Payer: Self-pay | Admitting: Family Medicine

## 2014-03-18 VITALS — BP 119/66 | HR 67 | Temp 100.4°F

## 2014-03-18 DIAGNOSIS — X58XXXA Exposure to other specified factors, initial encounter: Secondary | ICD-10-CM | POA: Diagnosis present

## 2014-03-18 DIAGNOSIS — Z792 Long term (current) use of antibiotics: Secondary | ICD-10-CM | POA: Diagnosis not present

## 2014-03-18 DIAGNOSIS — M109 Gout, unspecified: Secondary | ICD-10-CM | POA: Diagnosis present

## 2014-03-18 DIAGNOSIS — Z6836 Body mass index (BMI) 36.0-36.9, adult: Secondary | ICD-10-CM | POA: Diagnosis not present

## 2014-03-18 DIAGNOSIS — N183 Chronic kidney disease, stage 3 (moderate): Secondary | ICD-10-CM | POA: Diagnosis present

## 2014-03-18 DIAGNOSIS — I2510 Atherosclerotic heart disease of native coronary artery without angina pectoris: Secondary | ICD-10-CM | POA: Diagnosis present

## 2014-03-18 DIAGNOSIS — N179 Acute kidney failure, unspecified: Secondary | ICD-10-CM | POA: Diagnosis present

## 2014-03-18 DIAGNOSIS — Z803 Family history of malignant neoplasm of breast: Secondary | ICD-10-CM | POA: Diagnosis not present

## 2014-03-18 DIAGNOSIS — R509 Fever, unspecified: Secondary | ICD-10-CM | POA: Diagnosis present

## 2014-03-18 DIAGNOSIS — Z91013 Allergy to seafood: Secondary | ICD-10-CM | POA: Diagnosis not present

## 2014-03-18 DIAGNOSIS — L02419 Cutaneous abscess of limb, unspecified: Secondary | ICD-10-CM

## 2014-03-18 DIAGNOSIS — Z885 Allergy status to narcotic agent status: Secondary | ICD-10-CM

## 2014-03-18 DIAGNOSIS — M1990 Unspecified osteoarthritis, unspecified site: Secondary | ICD-10-CM | POA: Diagnosis present

## 2014-03-18 DIAGNOSIS — S81801A Unspecified open wound, right lower leg, initial encounter: Secondary | ICD-10-CM | POA: Diagnosis present

## 2014-03-18 DIAGNOSIS — L03119 Cellulitis of unspecified part of limb: Secondary | ICD-10-CM

## 2014-03-18 DIAGNOSIS — R944 Abnormal results of kidney function studies: Secondary | ICD-10-CM | POA: Diagnosis present

## 2014-03-18 DIAGNOSIS — T798XXA Other early complications of trauma, initial encounter: Secondary | ICD-10-CM

## 2014-03-18 DIAGNOSIS — M7989 Other specified soft tissue disorders: Secondary | ICD-10-CM

## 2014-03-18 DIAGNOSIS — Z79899 Other long term (current) drug therapy: Secondary | ICD-10-CM | POA: Diagnosis not present

## 2014-03-18 DIAGNOSIS — I872 Venous insufficiency (chronic) (peripheral): Secondary | ICD-10-CM | POA: Diagnosis present

## 2014-03-18 DIAGNOSIS — Z853 Personal history of malignant neoplasm of breast: Secondary | ICD-10-CM | POA: Diagnosis not present

## 2014-03-18 DIAGNOSIS — E785 Hyperlipidemia, unspecified: Secondary | ICD-10-CM | POA: Diagnosis present

## 2014-03-18 DIAGNOSIS — E039 Hypothyroidism, unspecified: Secondary | ICD-10-CM | POA: Diagnosis present

## 2014-03-18 DIAGNOSIS — Z91041 Radiographic dye allergy status: Secondary | ICD-10-CM

## 2014-03-18 DIAGNOSIS — I129 Hypertensive chronic kidney disease with stage 1 through stage 4 chronic kidney disease, or unspecified chronic kidney disease: Secondary | ICD-10-CM | POA: Diagnosis present

## 2014-03-18 DIAGNOSIS — R197 Diarrhea, unspecified: Secondary | ICD-10-CM | POA: Diagnosis not present

## 2014-03-18 DIAGNOSIS — T502X5A Adverse effect of carbonic-anhydrase inhibitors, benzothiadiazides and other diuretics, initial encounter: Secondary | ICD-10-CM | POA: Diagnosis present

## 2014-03-18 DIAGNOSIS — Z800 Family history of malignant neoplasm of digestive organs: Secondary | ICD-10-CM

## 2014-03-18 DIAGNOSIS — E669 Obesity, unspecified: Secondary | ICD-10-CM | POA: Diagnosis present

## 2014-03-18 DIAGNOSIS — R600 Localized edema: Secondary | ICD-10-CM | POA: Insufficient documentation

## 2014-03-18 DIAGNOSIS — Z8049 Family history of malignant neoplasm of other genital organs: Secondary | ICD-10-CM | POA: Diagnosis not present

## 2014-03-18 DIAGNOSIS — R609 Edema, unspecified: Secondary | ICD-10-CM | POA: Diagnosis not present

## 2014-03-18 DIAGNOSIS — Z96641 Presence of right artificial hip joint: Secondary | ICD-10-CM | POA: Diagnosis present

## 2014-03-18 DIAGNOSIS — Z8249 Family history of ischemic heart disease and other diseases of the circulatory system: Secondary | ICD-10-CM

## 2014-03-18 DIAGNOSIS — I739 Peripheral vascular disease, unspecified: Secondary | ICD-10-CM | POA: Diagnosis present

## 2014-03-18 DIAGNOSIS — Z806 Family history of leukemia: Secondary | ICD-10-CM

## 2014-03-18 DIAGNOSIS — L03115 Cellulitis of right lower limb: Secondary | ICD-10-CM | POA: Diagnosis present

## 2014-03-18 DIAGNOSIS — I6529 Occlusion and stenosis of unspecified carotid artery: Secondary | ICD-10-CM | POA: Diagnosis present

## 2014-03-18 DIAGNOSIS — M79674 Pain in right toe(s): Secondary | ICD-10-CM

## 2014-03-18 DIAGNOSIS — C50911 Malignant neoplasm of unspecified site of right female breast: Secondary | ICD-10-CM | POA: Diagnosis present

## 2014-03-18 LAB — CBC WITH DIFFERENTIAL/PLATELET
Basophils Absolute: 0 10*3/uL (ref 0.0–0.1)
Basophils Relative: 0 % (ref 0–1)
Eosinophils Absolute: 0.1 10*3/uL (ref 0.0–0.7)
Eosinophils Relative: 1 % (ref 0–5)
HCT: 38.8 % (ref 36.0–46.0)
Hemoglobin: 12.6 g/dL (ref 12.0–15.0)
Lymphocytes Relative: 16 % (ref 12–46)
Lymphs Abs: 1.8 10*3/uL (ref 0.7–4.0)
MCH: 28.6 pg (ref 26.0–34.0)
MCHC: 32.5 g/dL (ref 30.0–36.0)
MCV: 88.2 fL (ref 78.0–100.0)
MONOS PCT: 16 % — AB (ref 3–12)
Monocytes Absolute: 1.8 10*3/uL — ABNORMAL HIGH (ref 0.1–1.0)
Neutro Abs: 7.9 10*3/uL — ABNORMAL HIGH (ref 1.7–7.7)
Neutrophils Relative %: 67 % (ref 43–77)
Platelets: 318 10*3/uL (ref 150–400)
RBC: 4.4 MIL/uL (ref 3.87–5.11)
RDW: 13.6 % (ref 11.5–15.5)
WBC: 11.7 10*3/uL — ABNORMAL HIGH (ref 4.0–10.5)

## 2014-03-18 LAB — BASIC METABOLIC PANEL
Anion gap: 12 (ref 5–15)
BUN: 31 mg/dL — AB (ref 6–23)
CHLORIDE: 99 mmol/L (ref 96–112)
CO2: 24 mmol/L (ref 19–32)
Calcium: 9.7 mg/dL (ref 8.4–10.5)
Creatinine, Ser: 1.34 mg/dL — ABNORMAL HIGH (ref 0.50–1.10)
GFR calc Af Amer: 43 mL/min — ABNORMAL LOW (ref >90)
GFR calc non Af Amer: 37 mL/min — ABNORMAL LOW (ref >90)
GLUCOSE: 102 mg/dL — AB (ref 70–99)
POTASSIUM: 3.6 mmol/L (ref 3.5–5.1)
Sodium: 135 mmol/L (ref 135–145)

## 2014-03-18 LAB — BRAIN NATRIURETIC PEPTIDE: B NATRIURETIC PEPTIDE 5: 137.5 pg/mL — AB (ref 0.0–100.0)

## 2014-03-18 LAB — I-STAT CG4 LACTIC ACID, ED: Lactic Acid, Venous: 1.19 mmol/L (ref 0.5–2.0)

## 2014-03-18 LAB — SEDIMENTATION RATE: SED RATE: 46 mm/h — AB (ref 0–22)

## 2014-03-18 MED ORDER — ACETAMINOPHEN 325 MG PO TABS
650.0000 mg | ORAL_TABLET | Freq: Once | ORAL | Status: AC
  Administered 2014-03-18: 650 mg via ORAL
  Filled 2014-03-18: qty 2

## 2014-03-18 MED ORDER — SODIUM CHLORIDE 0.9 % IJ SOLN: 3.0000 mL | INTRAMUSCULAR | Status: DC | PRN

## 2014-03-18 MED ORDER — ENOXAPARIN SODIUM 60 MG/0.6ML ~~LOC~~ SOLN
0.5000 mg/kg | SUBCUTANEOUS | Status: DC
Start: 2014-03-18 — End: 2014-03-24
  Administered 2014-03-18 – 2014-03-23 (×6): 55 mg via SUBCUTANEOUS
  Filled 2014-03-18 (×7): qty 0.6

## 2014-03-18 MED ORDER — ACETAMINOPHEN 650 MG RE SUPP: 650.0000 mg | Freq: Four times a day (QID) | RECTAL | Status: DC | PRN

## 2014-03-18 MED ORDER — ACETAMINOPHEN 325 MG PO TABS
650.0000 mg | ORAL_TABLET | Freq: Four times a day (QID) | ORAL | Status: DC | PRN
  Administered 2014-03-20 – 2014-03-23 (×2): 650 mg via ORAL
  Filled 2014-03-18 (×2): qty 2

## 2014-03-18 MED ORDER — ONDANSETRON HCL 4 MG/2ML IJ SOLN: 4.0000 mg | Freq: Four times a day (QID) | INTRAMUSCULAR | Status: DC | PRN

## 2014-03-18 MED ORDER — SODIUM CHLORIDE 0.9 % IV SOLN
1250.0000 mg | INTRAVENOUS | Status: DC
  Administered 2014-03-19 – 2014-03-23 (×5): 1250 mg via INTRAVENOUS
  Filled 2014-03-18 (×5): qty 1250

## 2014-03-18 MED ORDER — SODIUM CHLORIDE 0.9 % IJ SOLN
3.0000 mL | Freq: Two times a day (BID) | INTRAMUSCULAR | Status: DC
  Administered 2014-03-19 – 2014-03-23 (×7): 3 mL via INTRAVENOUS

## 2014-03-18 MED ORDER — CHLORHEXIDINE GLUCONATE 0.12 % MT SOLN
15.0000 mL | Freq: Two times a day (BID) | OROMUCOSAL | Status: DC
  Administered 2014-03-20 – 2014-03-23 (×7): 15 mL via OROMUCOSAL
  Filled 2014-03-18 (×14): qty 15

## 2014-03-18 MED ORDER — CETYLPYRIDINIUM CHLORIDE 0.05 % MT LIQD
7.0000 mL | Freq: Two times a day (BID) | OROMUCOSAL | Status: DC
  Administered 2014-03-22 – 2014-03-24 (×4): 7 mL via OROMUCOSAL

## 2014-03-18 MED ORDER — SODIUM CHLORIDE 0.9 % IV SOLN
2000.0000 mg | INTRAVENOUS | Status: AC
  Administered 2014-03-18: 2000 mg via INTRAVENOUS
  Filled 2014-03-18: qty 2000

## 2014-03-18 MED ORDER — ONDANSETRON HCL 4 MG PO TABS: 4.0000 mg | ORAL_TABLET | Freq: Four times a day (QID) | ORAL | Status: DC | PRN

## 2014-03-18 MED ORDER — DOCUSATE SODIUM 100 MG PO CAPS
100.0000 mg | ORAL_CAPSULE | Freq: Two times a day (BID) | ORAL | Status: DC
  Administered 2014-03-20 – 2014-03-23 (×3): 100 mg via ORAL
  Filled 2014-03-18 (×14): qty 1

## 2014-03-18 MED ORDER — SODIUM CHLORIDE 0.9 % IV SOLN: 250.0000 mL | INTRAVENOUS | Status: DC | PRN

## 2014-03-18 MED ORDER — LEVOTHYROXINE SODIUM 137 MCG PO TABS
137.0000 ug | ORAL_TABLET | Freq: Every day | ORAL | Status: DC
  Administered 2014-03-19 – 2014-03-24 (×6): 137 ug via ORAL
  Filled 2014-03-18 (×7): qty 1

## 2014-03-18 MED ORDER — PIPERACILLIN-TAZOBACTAM 3.375 G IVPB
3.3750 g | Freq: Once | INTRAVENOUS | Status: AC
  Administered 2014-03-18: 3.375 g via INTRAVENOUS
  Filled 2014-03-18: qty 50

## 2014-03-18 MED ORDER — VANCOMYCIN HCL IN DEXTROSE 1-5 GM/200ML-% IV SOLN
1000.0000 mg | Freq: Once | INTRAVENOUS | Status: DC
  Filled 2014-03-18: qty 200

## 2014-03-18 NOTE — ED Notes (Signed)
Per pt, states injured leg on in February, has been treated by Dr Fry-increased swelling, and discoloration-unable to bear weight

## 2014-03-18 NOTE — ED Notes (Signed)
Pt, being sent by Sarajane Jews MD, r/o cellulitis and osteomyelitis.  C/o fever and RLE pain.  Pt had a temp of 100.4 in the office.  No medications were provided.

## 2014-03-18 NOTE — Progress Notes (Signed)
ANTIBIOTIC CONSULT NOTE - INITIAL  Pharmacy Consult for Vancomycin  Indication: Cellulitis  Allergies  Allergen Reactions  . Shellfish Allergy Anaphylaxis and Hives    Hives all over the body.  . Percocet [Oxycodone-Acetaminophen] Hives  . Iodinated Diagnostic Agents     PT IS NOT AWARE OF IODINE ALLERGY, PREMEDICATED FOR PRIOR PREMEDS ONLY//A.C.  . Iodine Hives    Patient Measurements:   Adjusted Body Weight:   Vital Signs: Temp: 99.8 F (37.7 C) (03/07 1658) Temp Source: Oral (03/07 1658) BP: 121/57 mmHg (03/07 1912) Pulse Rate: 58 (03/07 1912) Intake/Output from previous day:   Intake/Output from this shift:    Labs:  Recent Labs  03/18/14 1749  WBC 11.7*  HGB 12.6  PLT 318  CREATININE 1.34*   Estimated Creatinine Clearance: 44.4 mL/min (by C-G formula based on Cr of 1.34). No results for input(s): VANCOTROUGH, VANCOPEAK, VANCORANDOM, GENTTROUGH, GENTPEAK, GENTRANDOM, TOBRATROUGH, TOBRAPEAK, TOBRARND, AMIKACINPEAK, AMIKACINTROU, AMIKACIN in the last 72 hours.   Microbiology: No results found for this or any previous visit (from the past 720 hour(s)).  Medical History: Past Medical History  Diagnosis Date  . Chronic venous insufficiency   . PVD (peripheral vascular disease)     mild carotid 11/05  . Osteoarthritis     bilat hips, severe  . Obesity   . CONTACT DERMATITIS   . GLUCOSE INTOLERANCE   . VITAMIN D DEFICIENCY   . Edema     pt  states had edema of feet and legs has been on lasix per dr. to help  . CAROTID STENOSIS   . DUPUYTREN'S CONTRACTURE   . MENOPAUSAL SYNDROME   . HLD (hyperlipidemia)   . Hypothyroidism     Dr. Ala Dach in [place]   . Breast cancer     R   Assessment: 80 yoF with PMHx venous insufficiency presents with fever and worsening leg swelling and pain despite starting oral antibiotics last week.  Pt sent to ED from PCP for concerns of cellulitis vs osteomyelitis.  X-rays neg for acute abnormality. Pharmacy consulted  to start vancomycin. Note weight 107.5kg  Outpatient bactrim 3/7 >> Zosyn x 1 3/7 >> Vancomycin  >>  Tmax: 100.4  WBCs: Slightly elevated 11.7K Renal: ARF- SCr 1.34, CrCl 44 ml/min (N39) (likely 2/2 overdiuresis)  3/7 wound culture: ordered 3/7 blood culture: ordered  Goal of Therapy:  Vancomycin trough level 10-15 mcg/ml  Plan:  Vancomycin 2000mg  IV x1, then 1250mg  IV q24h.  F/u renal function, cultures, vancomycin trough as warranted.    Ralene Bathe, PharmD, BCPS 03/18/2014, 9:02 PM  Pager: [phone]

## 2014-03-18 NOTE — ED Notes (Signed)
Pt in [place]. PA at bedside speaking with family.

## 2014-03-18 NOTE — H&P (Signed)
PCP:  Gwendolyn Grant, MD  [facility]  Chief Complaint:  Right leg wound and fever  HPI: Rachael [name] is a 81 y.o. female   has a past medical history of Chronic venous insufficiency; PVD (peripheral vascular disease); Osteoarthritis; Obesity; CONTACT DERMATITIS; GLUCOSE INTOLERANCE; VITAMIN D DEFICIENCY; Edema; CAROTID STENOSIS; DUPUYTREN'S CONTRACTURE; MENOPAUSAL SYNDROME; HLD (hyperlipidemia); Hypothyroidism; and Breast cancer.   Presented with  Patient has hx of venous insufficiency. On Feb 11 th she cut her leg since then she had gradual swelling and pain.  Patient was seen at PCP office 5 days ago and was started on Doxycycline she felt there was not relief. Today she started to have fever up to 100.4 and nausea no vomiting. She presented to ER plain films were unremarkable. She is scheduled to see Dr. Scot Dock for her venous insufficiency. Patient states that she have had chronic bilateral leg extremity swelling. Which has been treated by hydrochlorothiazide. For the past 2 weeks she's been taking extra doses of Lasix didn't seem to swell help of leg swelling but decrease her abdominal girth and cause it to lose 8 pounds she denies history of heart failure. Never had an echogram denies any chest pain or shortness of breath.  Hospitalist was called for admission for wound infection, cellulits  Review of Systems:    Pertinent positives include:  Fevers, chills, fatigue, Bilateral lower extremity swelling   Constitutional:  No weight loss, night sweats, weight loss  HEENT:  No headaches, Difficulty swallowing,Tooth/dental problems,Sore throat,  No sneezing, itching, ear ache, nasal congestion, post nasal drip,  Cardio-vascular:  No chest pain, Orthopnea, PND, anasarca, dizziness, palpitations.no  GI:  No heartburn, indigestion, abdominal pain, nausea, vomiting, diarrhea, change in bowel habits, loss of appetite, melena, blood in stool, hematemesis Resp:  no shortness of  breath at rest. No dyspnea on exertion, No excess mucus, no productive cough, No non-productive cough, No coughing up of blood.No change in color of mucus.No wheezing. Skin:  no rash or lesions. No jaundice GU:  no dysuria, change in color of urine, no urgency or frequency. No straining to urinate.  No flank pain.  Musculoskeletal:  No joint pain or no joint swelling. No decreased range of motion. No back pain.  Psych:  No change in mood or affect. No depression or anxiety. No memory loss.  Neuro: no localizing neurological complaints, no tingling, no weakness, no double vision, no gait abnormality, no slurred speech, no confusion  Otherwise ROS are negative except for above, 10 systems were reviewed  Past Medical History: Past Medical History  Diagnosis Date  . Chronic venous insufficiency   . PVD (peripheral vascular disease)     mild carotid 11/05  . Osteoarthritis     bilat hips, severe  . Obesity   . CONTACT DERMATITIS   . GLUCOSE INTOLERANCE   . VITAMIN D DEFICIENCY   . Edema     pt  states had edema of feet and legs has been on lasix per dr. to help  . CAROTID STENOSIS   . DUPUYTREN'S CONTRACTURE   . MENOPAUSAL SYNDROME   . HLD (hyperlipidemia)   . Hypothyroidism     Dr. Ala Dach in [place]   . Breast cancer     R   Past Surgical History  Procedure Laterality Date  . 2d echo  11/29/03    normal LV size. normal EF   . Normal caronary arteries      per pt. 2003  . Cholecystectomy    .  Inguinal hernia repair    . Tonsillectomy    . Right rotator cuff surgery    . Total hip arthroplasty  05/2008 and 10/2008    B hip - [facility]  . Hernia repair      incisional hernia  . Breast biopsy  05/11/11    right breast  . Breast lumpectomy      right breast  . Back surgery  1983    lumbarectomy  . Finger surgery    . Joint replacement Bilateral     Dr. Maureen Ralphs     Medications: Prior to Admission medications   Medication Sig Start Date End Date Taking?  Authorizing Provider  cholecalciferol (VITAMIN D) 1000 UNITS tablet Take 2,000 Units by mouth daily.   Yes Historical Provider, MD  Cyanocobalamin (B-12 PO) Take 1 tablet by mouth daily.    Yes Historical Provider, MD  levothyroxine (SYNTHROID, LEVOTHROID) 137 MCG tablet Take 137 mcg by mouth daily.    Yes Historical Provider, MD  Nutritional Supplements (JUICE PLUS FIBRE) LIQD Take 4 capsules by mouth 3 (three) times daily. 4 vegetable capsules with breakfast, 4 fruit capsules with lunch and 4 berry capsules with dinner.   Yes Historical Provider, MD  sulfamethoxazole-trimethoprim (BACTRIM DS,SEPTRA DS) 800-160 MG per tablet Take 1 tablet by mouth 2 (two) times daily. 03/14/14  Yes Golden Circle, FNP  Triamcinolone Acetonide (KENALOG EX) Apply 1 application topically 2 (two) times daily as needed (legs).   Yes Historical Provider, MD  triamterene-hydrochlorothiazide (DYAZIDE) 37.5-25 MG per capsule Take 1 each (1 capsule total) by mouth every morning. 11/27/12  Yes Minus Breeding, MD  anastrozole (ARIMIDEX) 1 MG tablet Take 1 tablet (1 mg total) by mouth daily. 03/12/14   Chauncey Cruel, MD  anastrozole (ARIMIDEX) 1 MG tablet Take 1 tablet (1 mg total) by mouth daily. Patient not taking: Reported on 03/18/2014 03/12/14   Chauncey Cruel, MD    Allergies:   Allergies  Allergen Reactions  . Shellfish Allergy Anaphylaxis and Hives    Hives all over the body.  . Percocet [Oxycodone-Acetaminophen] Hives  . Iodinated Diagnostic Agents     PT IS NOT AWARE OF IODINE ALLERGY, PREMEDICATED FOR PRIOR PREMEDS ONLY//A.C.  . Iodine Hives    Social History:  Ambulatory independently   Lives at home alone,      reports that she has never smoked. She has never used smokeless tobacco. She reports that she does not drink alcohol or use illicit drugs.    Family History: family history includes Breast cancer (age of onset: 72) in her daughter; Clotting disorder in her maternal grandmother; Colon cancer  in her cousin; Coronary artery disease in an other family member; Hypothyroidism in an other family member; Leukemia in her cousin; Pancreatic cancer in her maternal grandfather; Prostate cancer in her cousin, maternal uncle, and paternal uncle; Uterine cancer in her cousin; Uterine cancer (age of onset: 49) in her mother. There is no history of Anesthesia problems.    Physical Exam: Patient Vitals for the past 24 hrs:  BP Temp Temp src Pulse Resp SpO2  03/18/14 1912 121/57 mmHg - - (!) 58 18 97 %  03/18/14 1658 (!) 113/48 mmHg 99.8 F (37.7 C) Oral (!) 59 18 100 %    1. General:  in No Acute distress 2. Psychological: Alert and   Oriented 3. Head/ENT:   Moist Mucous Membranes  Head Non traumatic, neck supple                          Normal  Dentition 4. SKIN: normal Skin turgor,  Skin clean Dry and intact right leg wound 4 x 3 cm wound with purulent discharge 5. Heart: Regular rate and rhythm no Murmur, Rub or gallop 6. Lungs: Clear to auscultation bilaterally, no wheezes or crackles   7. Abdomen: Soft, non-tender, Non distended 8. Lower extremities: no clubbing, cyanosis,   Edema right worse than left 9. Neurologically Grossly intact, moving all 4 extremities equally 10. MSK: Normal range of motion  body mass index is unknown because there is no weight on file.   Labs on Admission:   Results for orders placed or performed during the hospital encounter of 03/18/14 (from the past 24 hour(s))  CBC with Differential     Status: Abnormal   Collection Time: 03/18/14  5:49 PM  Result Value Ref Range   WBC 11.7 (H) 4.0 - 10.5 K/uL   RBC 4.40 3.87 - 5.11 MIL/uL   Hemoglobin 12.6 12.0 - 15.0 g/dL   HCT 38.8 36.0 - 46.0 %   MCV 88.2 78.0 - 100.0 fL   MCH 28.6 26.0 - 34.0 pg   MCHC 32.5 30.0 - 36.0 g/dL   RDW 13.6 11.5 - 15.5 %   Platelets 318 150 - 400 K/uL   Neutrophils Relative % 67 43 - 77 %   Neutro Abs 7.9 (H) 1.7 - 7.7 K/uL   Lymphocytes Relative 16 12  - 46 %   Lymphs Abs 1.8 0.7 - 4.0 K/uL   Monocytes Relative 16 (H) 3 - 12 %   Monocytes Absolute 1.8 (H) 0.1 - 1.0 K/uL   Eosinophils Relative 1 0 - 5 %   Eosinophils Absolute 0.1 0.0 - 0.7 K/uL   Basophils Relative 0 0 - 1 %   Basophils Absolute 0.0 0.0 - 0.1 K/uL  Basic metabolic panel     Status: Abnormal   Collection Time: 03/18/14  5:49 PM  Result Value Ref Range   Sodium 135 135 - 145 mmol/L   Potassium 3.6 3.5 - 5.1 mmol/L   Chloride 99 96 - 112 mmol/L   CO2 24 19 - 32 mmol/L   Glucose, Bld 102 (H) 70 - 99 mg/dL   BUN 31 (H) 6 - 23 mg/dL   Creatinine, Ser 1.34 (H) 0.50 - 1.10 mg/dL   Calcium 9.7 8.4 - 10.5 mg/dL   GFR calc non Af Amer 37 (L) >90 mL/min   GFR calc Af Amer 43 (L) >90 mL/min   Anion gap 12 5 - 15  I-Stat CG4 Lactic Acid, ED     Status: None   Collection Time: 03/18/14  5:55 PM  Result Value Ref Range   Lactic Acid, Venous 1.19 0.5 - 2.0 mmol/L    UA not obtained  Lab Results  Component Value Date   HGBA1C 6.0 11/03/2011    Estimated Creatinine Clearance: 44.4 mL/min (by C-G formula based on Cr of 1.34).  BNP (last 3 results) No results for input(s): PROBNP in the last 8760 hours.  Other results:  I have pearsonaly reviewed this: ECG not obtained   There were no vitals filed for this visit.   Cultures: No results found for: SDES, SPECREQUEST, CULT, REPTSTATUS   Radiological Exams on Admission: Dg Tibia/fibula Right  03/18/2014   CLINICAL DATA:  81 year old female with history of right  lower extremity wound since 02/21/2014, which now appears infected.  EXAM: RIGHT TIBIA AND FIBULA - 2 VIEW  COMPARISON:  No priors.  FINDINGS: Three views of the right tibia and fibula demonstrate no destructive osseous lesion. No acute displaced fractures. Numerous vascular and soft tissue calcifications.  IMPRESSION: 1. No acute radiographic abnormality of the right tibia or fibula.   Electronically Signed   By: Vinnie Langton M.D.   On: 03/18/2014 17:31     Chart has been reviewed  Assessment/Plan  81 year old female history of venous insufficiency breast cancer in remission presents with right leg swelling and purulent drainage from the wound likely secondary towound infection  was noted to have elevated creatinine likely secondary to overdiuresis  Present on Admission:  . Wound infection - will obtain wound culture given fever of obtain blood cultures. Wound care consult. Correlate with vancomycin since patient failed outpatient doxycycline.  . Venous (peripheral) insufficiency - patient has follow-up appointment with renal specialist  . Breast cancer - stable,   please let oncology Nona morning patient is here that she needs her Arimidex continued . Cellulitis - IV antibiotics will evaluate for peripheral vascular disease given poor healing. We will also check for diabetes. Bilateral leg edema right worse than left is presumed to be secondary to venous stasis disease. Does not patient complete workup an echogram could be obtained  . Acute renal failure likely secondary to overdiuresis. Patient never had an echogram done. Denies any history of consistent heart failure low extremity edema mole likely secondary to venous insufficiency. We will obtain urine electrolytes and give gentle IV fluid obtain UA currently pending    Prophylaxis:   Lovenox, Protonix  CODE STATUS:  FULL CODE    Other plan as per orders.  I have spent a total of  55 min on this admission  Idonna Heeren 03/18/2014, 7:26 PM  [facility]  Pager [phone]   after 2 AM please page floor coverage PA If 7AM-7PM, please contact the day team taking care of the patient  [email or web address]  Password [email or web address]

## 2014-03-18 NOTE — ED Provider Notes (Signed)
CSN: [id number]     Arrival date & time 03/18/14  1645 History   First MD Initiated Contact with Patient 03/18/14 1712     Chief Complaint  Patient presents with  . leg infection      (Consider location/radiation/quality/duration/timing/severity/associated sxs/prior Treatment) HPI  Rachael [name] is a 80 y.o. female with PMH of peripheral vascular disease, chronic venous insufficiency, osteoarthritis, hyperlipidemia, Breast cancer on arimidex presenting from Dr. Fry's office with concerns of cellulitis versus osteomyelitis. Patient with complaint of fever since last night as well as significant pain to wound. Pain worse with ambulation. She denies numbness, tingling, weakness but reports falling yesterday due to the pain. Temperature in the office 100.4. She reports some nausea but no vomiting or stool changes. Patient was prescribed Bactrim interpreter first dose 3 days ago. Patient noticed improvement in her wound until 3 days ago when the scab was removed and she had significant amount of bleeding. She denies any personal discharge. Wound started in mid February where patient cut her leg with the heel of her shoe.    Past Medical History  Diagnosis Date  . Chronic venous insufficiency   . PVD (peripheral vascular disease)     mild carotid 11/05  . Osteoarthritis     bilat hips, severe  . Obesity   . CONTACT DERMATITIS   . GLUCOSE INTOLERANCE   . VITAMIN D DEFICIENCY   . Edema     pt  states had edema of feet and legs has been on lasix per dr. to help  . CAROTID STENOSIS   . DUPUYTREN'S CONTRACTURE   . MENOPAUSAL SYNDROME   . HLD (hyperlipidemia)   . Hypothyroidism     Dr. Rizvi in [place] endo   . Breast cancer     R   Past Surgical History  Procedure Laterality Date  . 2d echo  11/29/03    normal LV size. normal EF   . Normal caronary arteries      per pt. 2003  . Cholecystectomy    . Inguinal hernia repair    . Tonsillectomy    . Right rotator cuff surgery    .  Total hip arthroplasty  05/2008 and 10/2008    B hip - alusio  . Hernia repair      incisional hernia  . Breast biopsy  05/11/11    right breast  . Breast lumpectomy      right breast  . Back surgery  1983    lumbarectomy  . Finger surgery    . Joint replacement Bilateral     Dr. Alusio   Family History  Problem Relation Age of Onset  . Coronary artery disease      family hx - female 1st degree relative <60  . Hypothyroidism      aunt   . Uterine cancer Mother 45  . Breast cancer Daughter 44    BRCA negative (no BART)  . Pancreatic cancer Maternal Grandfather     diagnosed in his 70s  . Anesthesia problems Neg Hx   . Prostate cancer Maternal Uncle     diagnosed in his 70s  . Colon cancer Cousin   . Prostate cancer Paternal Uncle     diagnosed in his 50s  . Clotting disorder Maternal Grandmother   . Uterine cancer Cousin   . Leukemia Cousin   . Prostate cancer Cousin    History  Substance Use Topics  . Smoking status: Never Smoker   . Smokeless tobacco:   Never Used  . Alcohol Use: No   OB History    No data available     Review of Systems 10 Systems reviewed and are negative for acute change except as noted in the HPI.    Allergies  Shellfish allergy; Percocet; Iodinated diagnostic agents; and Iodine  Home Medications   Prior to Admission medications   Medication Sig Start Date End Date Taking? Authorizing Provider  cholecalciferol (VITAMIN D) 1000 UNITS tablet Take 2,000 Units by mouth daily.   Yes Historical Provider, MD  Cyanocobalamin (B-12 PO) Take 1 tablet by mouth daily.    Yes Historical Provider, MD  levothyroxine (SYNTHROID, LEVOTHROID) 137 MCG tablet Take 137 mcg by mouth daily.    Yes Historical Provider, MD  Nutritional Supplements (JUICE PLUS FIBRE) LIQD Take 4 capsules by mouth 3 (three) times daily. 4 vegetable capsules with breakfast, 4 fruit capsules with lunch and 4 berry capsules with dinner.   Yes Historical Provider, MD   sulfamethoxazole-trimethoprim (BACTRIM DS,SEPTRA DS) 800-160 MG per tablet Take 1 tablet by mouth 2 (two) times daily. 03/14/14  Yes Gregory D Calone, FNP  Triamcinolone Acetonide (KENALOG EX) Apply 1 application topically 2 (two) times daily as needed (legs).   Yes Historical Provider, MD  triamterene-hydrochlorothiazide (DYAZIDE) 37.5-25 MG per capsule Take 1 each (1 capsule total) by mouth every morning. 11/27/12  Yes James Hochrein, MD  anastrozole (ARIMIDEX) 1 MG tablet Take 1 tablet (1 mg total) by mouth daily. 03/12/14   Gustav C Magrinat, MD  anastrozole (ARIMIDEX) 1 MG tablet Take 1 tablet (1 mg total) by mouth daily. Patient not taking: Reported on 03/18/2014 03/12/14   Gustav C Magrinat, MD   BP 121/57 mmHg  Pulse 58  Temp(Src) 99.8 F (37.7 C) (Oral)  Resp 18  SpO2 97% Physical Exam  Constitutional: She appears well-developed and well-nourished. No distress.  HENT:  Head: Normocephalic and atraumatic.  Eyes: Conjunctivae and EOM are normal. Right eye exhibits no discharge. Left eye exhibits no discharge.  Cardiovascular: Normal rate and regular rhythm.   1+ pedal pulses equal bilaterally.  Pulmonary/Chest: Effort normal and breath sounds normal. No respiratory distress. She has no wheezes.  Abdominal: Soft. Bowel sounds are normal. She exhibits no distension. There is no tenderness.  Musculoskeletal:  Right mid lower extremity with open 2cm x 2cm wound with surrounding erythema and tenderness. No induration or purulent drainage.   Neurological: She is alert. She exhibits normal muscle tone. Coordination normal.  Strength and sensation intact.  Skin: Skin is warm and dry. She is not diaphoretic.  Nursing note and vitals reviewed.   ED Course  Procedures (including critical care time) Labs Review Labs Reviewed  CBC WITH DIFFERENTIAL/PLATELET - Abnormal; Notable for the following:    WBC 11.7 (*)    Neutro Abs 7.9 (*)    Monocytes Relative 16 (*)    Monocytes Absolute 1.8 (*)     All other components within normal limits  BASIC METABOLIC PANEL - Abnormal; Notable for the following:    Glucose, Bld 102 (*)    BUN 31 (*)    Creatinine, Ser 1.34 (*)    GFR calc non Af Amer 37 (*)    GFR calc Af Amer 43 (*)    All other components within normal limits  SEDIMENTATION RATE - Abnormal; Notable for the following:    Sed Rate 46 (*)    All other components within normal limits  CULTURE, BLOOD (SINGLE)  C-REACTIVE PROTEIN  I-STAT CG4 LACTIC   ACID, ED    Imaging Review Dg Tibia/fibula Right  03/18/2014   CLINICAL DATA:  80-year-old female with history of right lower extremity wound since 02/21/2014, which now appears infected.  EXAM: RIGHT TIBIA AND FIBULA - 2 VIEW  COMPARISON:  No priors.  FINDINGS: Three views of the right tibia and fibula demonstrate no destructive osseous lesion. No acute displaced fractures. Numerous vascular and soft tissue calcifications.  IMPRESSION: 1. No acute radiographic abnormality of the right tibia or fibula.   Electronically Signed   By: Daniel  Entrikin M.D.   On: 03/18/2014 17:31     EKG Interpretation None      MDM   Final diagnoses:  Wound infection, initial encounter   Patient with PMH of PVD sent from primary care for concerns of cellulitis versus osteomyelitis of chronic wound. Patient with documented fever in the ED as well as significant pain to right lower extremity with open wound. No pertinent drainage or foul odor. Wound is open but no bone visualized. Upper epidermis removed. Neurovascularly intact. Patient has been on Bactrim for the past 3-4 days. X-ray without evidence of osteomyelitis in patient's white count 11.4.  Concern for osteomyelitis with pt's fever while on bactrim. CRP and ESR pending. Blood cultures pending. IV Vanc/Zosyn started. Consult to hospitalist spoke with Dr. Doutova who agrees to evaluate and admit.  Discussed all results and patient verbalizes understanding and agrees with plan.  This is a  shared patient. This patient was discussed with the physician who saw and evaluated the patient and agrees with the plan.      Victoria Creech, PA-C 03/18/14 2027  Jon Knapp, MD 03/19/14 1609 

## 2014-03-18 NOTE — Progress Notes (Signed)
Pre visit review using our clinic review tool, if applicable. No additional management support is needed unless otherwise documented below in the visit note. 

## 2014-03-19 ENCOUNTER — Encounter: Payer: Self-pay | Admitting: Family Medicine

## 2014-03-19 DIAGNOSIS — R609 Edema, unspecified: Secondary | ICD-10-CM

## 2014-03-19 DIAGNOSIS — C50919 Malignant neoplasm of unspecified site of unspecified female breast: Secondary | ICD-10-CM

## 2014-03-19 LAB — MAGNESIUM: MAGNESIUM: 1.9 mg/dL (ref 1.5–2.5)

## 2014-03-19 LAB — COMPREHENSIVE METABOLIC PANEL
ALT: 13 U/L (ref 0–35)
ANION GAP: 9 (ref 5–15)
AST: 16 U/L (ref 0–37)
Albumin: 3.4 g/dL — ABNORMAL LOW (ref 3.5–5.2)
Alkaline Phosphatase: 74 U/L (ref 39–117)
BILIRUBIN TOTAL: 1.2 mg/dL (ref 0.3–1.2)
BUN: 32 mg/dL — ABNORMAL HIGH (ref 6–23)
CO2: 26 mmol/L (ref 19–32)
Calcium: 9.2 mg/dL (ref 8.4–10.5)
Chloride: 102 mmol/L (ref 96–112)
Creatinine, Ser: 1.33 mg/dL — ABNORMAL HIGH (ref 0.50–1.10)
GFR calc Af Amer: 43 mL/min — ABNORMAL LOW (ref >90)
GFR, EST NON AFRICAN AMERICAN: 37 mL/min — AB (ref >90)
Glucose, Bld: 111 mg/dL — ABNORMAL HIGH (ref 70–99)
POTASSIUM: 3.8 mmol/L (ref 3.5–5.1)
SODIUM: 137 mmol/L (ref 135–145)
Total Protein: 6.6 g/dL (ref 6.0–8.3)

## 2014-03-19 LAB — CBC
HCT: 36.3 % (ref 36.0–46.0)
Hemoglobin: 12 g/dL (ref 12.0–15.0)
MCH: 29.1 pg (ref 26.0–34.0)
MCHC: 33.1 g/dL (ref 30.0–36.0)
MCV: 88.1 fL (ref 78.0–100.0)
PLATELETS: 268 10*3/uL (ref 150–400)
RBC: 4.12 MIL/uL (ref 3.87–5.11)
RDW: 13.7 % (ref 11.5–15.5)
WBC: 9.5 10*3/uL (ref 4.0–10.5)

## 2014-03-19 LAB — PHOSPHORUS: PHOSPHORUS: 4 mg/dL (ref 2.3–4.6)

## 2014-03-19 LAB — C-REACTIVE PROTEIN: CRP: 5.7 mg/dL — AB (ref <0.60)

## 2014-03-19 LAB — HEMOGLOBIN A1C
HEMOGLOBIN A1C: 5.7 % — AB (ref 4.8–5.6)
MEAN PLASMA GLUCOSE: 117 mg/dL

## 2014-03-19 LAB — TSH: TSH: 0.458 u[IU]/mL (ref 0.350–4.500)

## 2014-03-19 MED ORDER — PIPERACILLIN-TAZOBACTAM 3.375 G IVPB
3.3750 g | Freq: Three times a day (TID) | INTRAVENOUS | Status: DC
  Administered 2014-03-19 – 2014-03-24 (×16): 3.375 g via INTRAVENOUS
  Filled 2014-03-19 (×17): qty 50

## 2014-03-19 MED ORDER — ANASTROZOLE 1 MG PO TABS
1.0000 mg | ORAL_TABLET | Freq: Every day | ORAL | Status: DC
  Administered 2014-03-19 – 2014-03-24 (×6): 1 mg via ORAL
  Filled 2014-03-19 (×6): qty 1

## 2014-03-19 NOTE — Progress Notes (Signed)
CARE MANAGEMENT NOTE 03/19/2014  Patient:  Rachael [name],Rachael [name]   Account Number:  [id number]  Date Initiated:  03/19/2014  Documentation initiated by:  Edwyna Shell  Subjective/Objective Assessment:   81 yo female admitted with wound infection from home     Action/Plan:   discharge planning   Anticipated DC Date:  03/21/2014   Anticipated DC Plan:  [place]  CM consult      Choice offered to / List presented to:             Status of service:  In process, will continue to follow Medicare Important Message given?   (If response is "NO", the following Medicare IM given date fields will be blank) Date Medicare IM given:   Medicare IM given by:   Date Additional Medicare IM given:   Additional Medicare IM given by:    Discharge Disposition:    Per UR Regulation:    If discussed at Long Length of Stay Meetings, dates discussed:    Comments:  03/19/14 Edwyna Shell RN BSN CM [phone] Patient lives at home independently, drives self. Has [name] walker and [name] cane and just recently had to start using the walker again since the wound on her foot has gotten worse. No CM needs at this time, will continue to follow.

## 2014-03-19 NOTE — Consult Note (Signed)
WOC wound consult note Reason for Consult: Right LE traumatic injury Wound type:traumatic Pressure Ulcer POA: No Measurement: 3.6cm x 3.8cm x 0.2cm Wound bed: 90% red, moist, granulating, 10% necrotic at periphery Drainage (amount, consistency, odor) serous Periwound:intact, macerated Dressing procedure/placement/frequency:I will provide for a topical antimicrobial to support the system antibiotic therapy in place.  This will be changed daily.  To assist with healing, I will recommend application of ACE bandages wrapped from toe to knee during the day. Elevation of RLE while in bed is suggested. [facility] nursing team will not follow, but will remain available to this patient, the nursing and medical team.  Please re-consult if needed. Thanks, Maudie Flakes, MSN, RN, [place], [place], [place] [phone])

## 2014-03-19 NOTE — Progress Notes (Signed)
   Subjective:    Patient ID: Rachael [name], female    DOB: 1933/04/07, 81 y.o.   MRN: [id number]  HPI Here to follow up an injury to the right lower leg which occurred on 02-21-14 at home while attempting to pull a boot off. She lacerated the shin and was seen at the [place] [facility] the next day. The wound was unable to to be closed so it was cleaned out and dressed with Neosporin. It was not healing in very well so she returned to the clinic on 03-14-14 when some cellulitis was seen. She was given Bactroban to dress it with and she was started on oral Bactrim DS. Since then the wound has been looking worse, the entire lower leg is swollen and red, and she has developed a fever. She feels nauseated but has not vomited.    Review of Systems  Constitutional: Positive for fever and chills. Negative for diaphoresis.  Respiratory: Negative.   Cardiovascular: Negative.   Skin: Positive for wound.       Objective:   Physical Exam  Constitutional: She appears well-developed and well-nourished.  Skin:  The right lower leg is red, warm, swollen and tender. The tenderness extends from the wound proximally up the shin towards the knee. The wound itself is open and draining clear fluid          Assessment & Plan:  The cellulitis has advanced and now she is febrile. I am concerned she may have osteomyelitis now. I think she needs to be admitted for debridement and IV antibiotics. She will be driven by her family to [facility] ED now for further evaluation.

## 2014-03-19 NOTE — Progress Notes (Signed)
ALLYSEN LAZO LAG:[id number] DOB: 1933/12/10 DOA: 03/18/2014 PCP: Gwendolyn Grant, MD  Brief narrative: 81 y/o ? htn, chroic periph edema, Breast Ca T1cN0 Est/Proges+ s/p R Lumpectomy 06/16/11-on Anastrazole foll Dr. Vira Blanco, s/p Incisional hernia repair '12, OA s/p R hip replacement 05/2008, s/p Lexiscan 2009 which was neg. H/o Carotid Stenosis noted in OV 03/2008 admitted to [facility] 03/18/14 c worsening celluliitis/LE swelling despite 5 d Rx Bactrim DS & ? lasix dosing Her actual initial injury happened 02/21/14 after she was dancing wearing cowboy boots. She went to see practitioner recently 03/14/14 because of worsening and was recommended Bactrim DS she went back and saw Dr. Sarajane Jews on 03/18/14 and had increased right lower extremity swelling and pain nausea and did not feel that-there was some questionable concern for osteomyelitis. She is scheduled to see Dr. Rachelle Hora for venous insufficiency soon  Past medical history-As per Problem list Chart reviewed as below-  Consultants:  Telephone consulted Dr. Jana Hakim  Procedures:  None   Antibiotics:  Vancomycin 3/7  Zosyn 3/7   Subjective    doing better, right lower extremity feels less swollen and less painful   she had overt fever overnight Denies chest pain Denies nausea Denies shortness of breath   Objective    Interim History:   Telemetry:    Objective: Filed Vitals:   03/18/14 1912 03/18/14 2033 03/19/14 0231 03/19/14 0513  BP: 121/57 120/49 118/50 107/41  Pulse: 58 53 51 56  Temp:  98.6 F (37 C) 98.2 F (36.8 C) 98.3 F (36.8 C)  TempSrc:  Oral Oral Oral  Resp: 18 18 16 16   SpO2: 97% 95% 95% 96%    Intake/Output Summary (Last 24 hours) at 03/19/14 0950 Last data filed at 03/19/14 0515  Gross per 24 hour  Intake    550 ml  Output      0 ml  Net    550 ml    Exam:  General: EOMI NCAT Cards: S1-S2 no murmur rub or gallop Resp: Clinically clear no added sound Abd soft obese nontender nondistended no  rebound Neurointact without any deficits   Data Reviewed: Basic Metabolic Panel:  Recent Labs Lab 03/18/14 1749 03/19/14 0525  NA 135 137  K 3.6 3.8  CL 99 102  CO2 24 26  GLUCOSE 102* 111*  BUN 31* 32*  CREATININE 1.34* 1.33*  CALCIUM 9.7 9.2  MG  --  1.9  PHOS  --  4.0   Liver Function Tests:  Recent Labs Lab 03/19/14 0525  AST 16  ALT 13  ALKPHOS 74  BILITOT 1.2  PROT 6.6  ALBUMIN 3.4*   No results for input(s): LIPASE, AMYLASE in the last 168 hours. No results for input(s): AMMONIA in the last 168 hours. CBC:  Recent Labs Lab 03/18/14 1749 03/19/14 0525  WBC 11.7* 9.5  NEUTROABS 7.9*  --   HGB 12.6 12.0  HCT 38.8 36.3  MCV 88.2 88.1  PLT 318 268   Cardiac Enzymes: No results for input(s): CKTOTAL, CKMB, CKMBINDEX, TROPONINI in the last 168 hours. BNP: Invalid input(s): POCBNP CBG: No results for input(s): GLUCAP in the last 168 hours.  Recent Results (from the past 240 hour(s))  Wound culture     Status: None (Preliminary result)   Collection Time: 03/18/14 10:47 PM  Result Value Ref Range Status   Specimen Description WOUND RIGHT LEG  Final   Special Requests Normal  Final   Gram Stain   Final    RARE WBC PRESENT, PREDOMINANTLY  PMN NO SQUAMOUS EPITHELIAL CELLS SEEN NO ORGANISMS SEEN Performed at [facility]    Culture PENDING  Incomplete   Report Status PENDING  Incomplete     Studies:              All Imaging reviewed and is as per above notation   Scheduled Meds: . antiseptic oral rinse  7 mL Mouth Rinse q12n4p  . chlorhexidine  15 mL Mouth Rinse BID  . docusate sodium  100 mg Oral BID  . enoxaparin (LOVENOX) injection  0.5 mg/kg Subcutaneous Q24H  . levothyroxine  137 mcg Oral QAC breakfast  . piperacillin-tazobactam (ZOSYN)  IV  3.375 g Intravenous 3 times per day  . sodium chloride  3 mL Intravenous Q12H  . vancomycin  1,250 mg Intravenous Q24H   Continuous Infusions:    Assessment/Plan: 1. Failed outpatient  management of cellulitis-was on by mouth Bactrim DS which has been transitioned to IV vancomycin/Zosyn . Consider de-escalation 2 either doxycycline or Bactrim DS once again on 3/9--patient states her son works with an infusion, knee and may be able to set her up with IV infusions -I do not think this will be necessary .  I also very much doubt that this is an osteomyelitis as at the 4 week x-ray that was done on admission, there was no radiological evidence for this which would expected after 4 weeks of an infection .--Wound care to see the patient as well. Follow blood culture-wound cultures in these cases are notoriously unreliable and I would not give that result any credence as it's probably contaminated unless it's a deep tissue culture 2. Breast cancer T1 /N0 , strongly estrogen/progesterone positive status post lumpectomy 6/13 -oncology has okayed the use of Arimidex as this should not have any complication  3. Acute kidney injury Estimated Creatinine Clearance: 44.7 mL/min (by C-G formula based on Cr of 1.33).--patient was on diuretic on admission which has been held.  Could consider starting back a lower dose of HCTZ 12.5 on discharge home 4. Osteoarthritis status post hip replacements 2010-stable at present time  5. Chronic bilateral venous insufficiency -outpatient follow-up . Briefly discussed with patient foot elevation and limitation of long periods of time standing .  6. History carotid stenosis 03/2008 -consider out a shouldn't follow up 7. Hypothyroidism -continue Synthroid 137 echogram daily   Code Status: Full CODE STATUS  Long discussion with family at bedside  Inpatient potential discharge one to 2 days pending resolution  Verneita Griffes, MD  [facility] Pager [phone] 03/19/2014, 9:50 AM    LOS: 1 day

## 2014-03-19 NOTE — Progress Notes (Signed)
UR complete 

## 2014-03-19 NOTE — Progress Notes (Signed)
*  PRELIMINARY RESULTS* Vascular Ultrasound Right lower extremity venous duplex completed. Right lower extremity is negative for deep vein thrombosis. There is no evidence of right Baker's cyst.  An ankle brachial index was also ordered. I explained this procedure to the patient, and she refused due to the pain at her ankle from her wound. The patient mentioned that she has been told she has good pulses in her feet; upon examination she did have palpable pedal pulses. In consideration of the ankle brachial index, while performing the venous duplex I also performed a right lower extremity arterial duplex. This revealed patent arteries in the right lower extremity with triphasic flow.  Preliminary results and patient encounter discussed with Elnita Maxwell, RN.  03/19/2014 11:28 AM Maudry Mayhew, RVT, RDCS, RDMS

## 2014-03-20 DIAGNOSIS — L0390 Cellulitis, unspecified: Secondary | ICD-10-CM

## 2014-03-20 NOTE — Progress Notes (Signed)
[facility]                                                                              Patient Demographics  Rachael [name], is a 81 y.o. female, DOB - 1933-03-05, DZH:[id number]  Admit date - 03/18/2014   Admitting Physician Toy Baker, MD  Outpatient Primary MD for the patient is Gwendolyn Grant, MD  LOS - 2   Chief Complaint  Patient presents with  . leg infection       HPI on 03/18/2014 by Dr. Toy Baker Patient has hx of venous insufficiency. On Feb 11 th she cut her leg since then she had gradual swelling and pain. Patient was seen at PCP office 5 days ago and was started on Doxycycline she felt there was not relief. Today she started to have fever up to 100.4 and nausea no vomiting. She presented to ER plain films were unremarkable. She is scheduled to see Dr. Scot Dock for her venous insufficiency. Patient states that she have had chronic bilateral leg extremity swelling. Which has been treated by hydrochlorothiazide. For the past 2 weeks she's been taking extra doses of Lasix didn't seem to swell help of leg swelling but decrease her abdominal girth and cause it to lose 8 pounds she denies history of heart failure. Never had an echogram denies any chest pain or shortness of breath. Hospitalist was called for admission for wound infection, cellulits  Assessment & Plan   Right lower extremity cellulitis/Wound -Patient failed outpatient treatment with Bactrim DS -Currently on vancomycin and Zosyn -Wound care has been consulted -Lower extremity Doppler: Negative for DVT, no evidence of Baker's cyst  History of breast cancer -Follow-up with oncology upon discharge, oncology made aware patient's admission -Continue Arimidex  CKD Stage 3 -Creatinine currently 1.3, baseline in previous years is approximately 1 -Patient's GFR continues to be in the stage III range  Osteoarthritis -Status post hip replacement 2010 -Currently stable  History of venous  insufficiency -Patient does have good lower extremity pulses -ABI was ordered on patient's right lower extremity however this was not done due to current wound -Patient did have right lower extremity Doppler conducted showing triphasic flow  History of carotid stenosis -Patient should follow-up with her primary care physician as an outpatient  Hypothyroidism -Continue Synthroid  Code Status: Full  Family Communication: None at bedside  Disposition Plan: Admitted.  Continue IV antibiotics until improvement in cellulitis  Time Spent in minutes   30 minutes  Procedures  RLE doppler  Consults   None  DVT Prophylaxis  Lovenox  Lab Results  Component Value Date   PLT 268 03/19/2014    Medications  Scheduled Meds: . anastrozole  1 mg Oral Daily  . antiseptic oral rinse  7 mL Mouth Rinse q12n4p  . chlorhexidine  15 mL Mouth Rinse BID  . docusate sodium  100 mg Oral BID  . enoxaparin (LOVENOX) injection  0.5 mg/kg Subcutaneous Q24H  . levothyroxine  137 mcg Oral QAC breakfast  . piperacillin-tazobactam (ZOSYN)  IV  3.375 g Intravenous 3 times per day  . sodium chloride  3 mL Intravenous Q12H  . vancomycin  1,250 mg Intravenous Q24H   Continuous Infusions:  PRN Meds:.sodium chloride, acetaminophen **OR** acetaminophen, ondansetron **OR** ondansetron (ZOFRAN) IV, sodium chloride  Antibiotics    Anti-infectives    Start     Dose/Rate Route Frequency Ordered Stop   03/19/14 2200  vancomycin (VANCOCIN) 1,250 mg in sodium chloride 0.9 % 250 mL IVPB     1,250 mg 166.7 mL/hr over 90 Minutes Intravenous Every 24 hours 03/18/14 2103     03/19/14 0600  piperacillin-tazobactam (ZOSYN) IVPB 3.375 g     3.375 g 12.5 mL/hr over 240 Minutes Intravenous 3 times per day 03/19/14 0555     03/18/14 2115  vancomycin (VANCOCIN) 2,000 mg in sodium chloride 0.9 % 500 mL IVPB     2,000 mg 250 mL/hr over 120 Minutes Intravenous STAT 03/18/14 2101 03/18/14 2343   03/18/14 1830  vancomycin  (VANCOCIN) IVPB 1000 mg/200 mL premix  Status:  Discontinued     1,000 mg 200 mL/hr over 60 Minutes Intravenous  Once 03/18/14 1821 03/18/14 2100   03/18/14 1830  piperacillin-tazobactam (ZOSYN) IVPB 3.375 g     3.375 g 12.5 mL/hr over 240 Minutes Intravenous  Once 03/18/14 1821 03/18/14 2316        Subjective:   Rachael [name] seen and examined today.  Patient continues complain of swelling in her right lower extremity as well as a small knot on the arch of her foot. She denies any chest pain, shortness of breath, dizziness, abdominal pain.  Objective:   Filed Vitals:   03/19/14 1410 03/19/14 2133 03/20/14 0542 03/20/14 1518  BP: 122/51 112/56 100/41 127/42  Pulse: 54 75 50 63  Temp: 98.5 F (36.9 C) 98.2 F (36.8 C) 98.4 F (36.9 C) 99.4 F (37.4 C)  TempSrc: Oral Oral Oral Oral  Resp: 18 20 18 16   Height: 5\' 8"  (1.727 m)     Weight: 107.502 kg (237 lb)     SpO2: 94% 97% 93% 95%    Wt Readings from Last 3 Encounters:  03/19/14 107.502 kg (237 lb)  03/14/14 107.502 kg (237 lb)  02/22/14 107.502 kg (237 lb)     Intake/Output Summary (Last 24 hours) at 03/20/14 1620 Last data filed at 03/20/14 1049  Gross per 24 hour  Intake    480 ml  Output      0 ml  Net    480 ml    Exam  General: Well developed, well nourished, NAD, appears younger than stated age  74: NCAT,, mucous membranes moist.   Cardiovascular: S1 S2 auscultated, no rubs, murmurs or gallops. Regular rate and rhythm.  Respiratory: Clear to auscultation bilaterally with equal chest rise  Abdomen: Soft, nontender, nondistended, + bowel sounds  Extremities: warm dry without cyanosis clubbing. Trace RLE edema   Neuro: AAOx3, nonfocal  Skin:  Wound on RLE, small erythematous nodule noted on patient's arch of the right foot.  Psych: Normal affect and demeanor with intact judgement and insight    Data Review   Micro Results Recent Results (from the past 240 hour(s))  Culture, blood (single)      Status: None (Preliminary result)   Collection Time: 03/18/14  9:00 PM  Result Value Ref Range Status   Specimen Description BLOOD LEFT HAND  Final   Special Requests BOTTLES DRAWN AEROBIC ONLY [place]  Final   Culture   Final           BLOOD CULTURE RECEIVED NO GROWTH TO DATE CULTURE WILL BE HELD FOR 5 DAYS BEFORE ISSUING A FINAL NEGATIVE REPORT Performed at [facility]    Report Status PENDING  Incomplete  Wound culture     Status: None (Preliminary result)   Collection Time: 03/18/14 10:47 PM  Result Value Ref Range Status   Specimen Description WOUND RIGHT LEG  Final   Special Requests Normal  Final   Gram Stain   Final    RARE WBC PRESENT, PREDOMINANTLY PMN NO SQUAMOUS EPITHELIAL CELLS SEEN NO ORGANISMS SEEN Performed at [facility]    Culture   Final    Culture reincubated for better growth Performed at [facility]    Report Status PENDING  Incomplete    Radiology Reports Dg Tibia/fibula Right  03/18/2014   CLINICAL DATA:  81 year old female with history of right lower extremity wound since 02/21/2014, which now appears infected.  EXAM: RIGHT TIBIA AND FIBULA - 2 VIEW  COMPARISON:  No priors.  FINDINGS: Three views of the right tibia and fibula demonstrate no destructive osseous lesion. No acute displaced fractures. Numerous vascular and soft tissue calcifications.  IMPRESSION: 1. No acute radiographic abnormality of the right tibia or fibula.   Electronically Signed   By: Vinnie Langton M.D.   On: 03/18/2014 17:31    CBC  Recent Labs Lab 03/18/14 1749 03/19/14 0525  WBC 11.7* 9.5  HGB 12.6 12.0  HCT 38.8 36.3  PLT 318 268  MCV 88.2 88.1  MCH 28.6 29.1  MCHC 32.5 33.1  RDW 13.6 13.7  LYMPHSABS 1.8  --   MONOABS 1.8*  --   EOSABS 0.1  --   BASOSABS 0.0  --     Chemistries   Recent Labs Lab 03/18/14 1749 03/19/14 0525  NA 135 137  K 3.6 3.8  CL 99 102  CO2 24 26  GLUCOSE 102* 111*  BUN 31* 32*  CREATININE 1.34* 1.33*    CALCIUM 9.7 9.2  MG  --  1.9  AST  --  16  ALT  --  13  ALKPHOS  --  74  BILITOT  --  1.2   ------------------------------------------------------------------------------------------------------------------ estimated creatinine clearance is 44.7 mL/min (by C-G formula based on Cr of 1.33). ------------------------------------------------------------------------------------------------------------------  Recent Labs  03/19/14 0525  HGBA1C 5.7*   ------------------------------------------------------------------------------------------------------------------ No results for input(s): CHOL, HDL, LDLCALC, TRIG, CHOLHDL, LDLDIRECT in the last 72 hours. ------------------------------------------------------------------------------------------------------------------  Recent Labs  03/19/14 0525  TSH 0.458   ------------------------------------------------------------------------------------------------------------------ No results for input(s): VITAMINB12, FOLATE, FERRITIN, TIBC, IRON, RETICCTPCT in the last 72 hours.  Coagulation profile No results for input(s): INR, PROTIME in the last 168 hours.  No results for input(s): DDIMER in the last 72 hours.  Cardiac Enzymes No results for input(s): CKMB, TROPONINI, MYOGLOBIN in the last 168 hours.  Invalid input(s): CK ------------------------------------------------------------------------------------------------------------------ Invalid input(s): POCBNP    Christhoper Busbee D.O. on 03/20/2014 at 4:20 PM  Between 7am to 7pm - Pager - [phone]  After 7pm go to [email or web address] - password [email or web address]  And look for the night coverage person covering for me after hours  [facility] Office  [phone]

## 2014-03-21 LAB — WOUND CULTURE: Special Requests: NORMAL

## 2014-03-21 LAB — BASIC METABOLIC PANEL
Anion gap: 8 (ref 5–15)
BUN: 24 mg/dL — ABNORMAL HIGH (ref 6–23)
CALCIUM: 9.1 mg/dL (ref 8.4–10.5)
CO2: 26 mmol/L (ref 19–32)
Chloride: 106 mmol/L (ref 96–112)
Creatinine, Ser: 1.11 mg/dL — ABNORMAL HIGH (ref 0.50–1.10)
GFR calc Af Amer: 54 mL/min — ABNORMAL LOW (ref >90)
GFR, EST NON AFRICAN AMERICAN: 46 mL/min — AB (ref >90)
Glucose, Bld: 95 mg/dL (ref 70–99)
Potassium: 3.9 mmol/L (ref 3.5–5.1)
Sodium: 140 mmol/L (ref 135–145)

## 2014-03-21 LAB — CBC
HCT: 34.9 % — ABNORMAL LOW (ref 36.0–46.0)
Hemoglobin: 11.1 g/dL — ABNORMAL LOW (ref 12.0–15.0)
MCH: 28.6 pg (ref 26.0–34.0)
MCHC: 31.8 g/dL (ref 30.0–36.0)
MCV: 89.9 fL (ref 78.0–100.0)
Platelets: 303 10*3/uL (ref 150–400)
RBC: 3.88 MIL/uL (ref 3.87–5.11)
RDW: 13.8 % (ref 11.5–15.5)
WBC: 8.9 10*3/uL (ref 4.0–10.5)

## 2014-03-21 LAB — VANCOMYCIN, TROUGH: VANCOMYCIN TR: 12.7 ug/mL (ref 10.0–20.0)

## 2014-03-21 NOTE — Progress Notes (Signed)
[facility]                                                                              Patient Demographics  Rachael [name], is a 81 y.o. female, DOB - 05-Jun-1933, ZDG:[id number]  Admit date - 03/18/2014   Admitting Physician Toy Baker, MD  Outpatient Primary MD for the patient is Gwendolyn Grant, MD  LOS - 3   Chief Complaint  Patient presents with  . leg infection       HPI on 03/18/2014 by Dr. Toy Baker Patient has hx of venous insufficiency. On Feb 11 th she cut her leg since then she had gradual swelling and pain. Patient was seen at PCP office 5 days ago and was started on Doxycycline she felt there was not relief. Today she started to have fever up to 100.4 and nausea no vomiting. She presented to ER plain films were unremarkable. She is scheduled to see Dr. Scot Dock for her venous insufficiency. Patient states that she have had chronic bilateral leg extremity swelling. Which has been treated by hydrochlorothiazide. For the past 2 weeks she's been taking extra doses of Lasix didn't seem to swell help of leg swelling but decrease her abdominal girth and cause it to lose 8 pounds she denies history of heart failure. Never had an echogram denies any chest pain or shortness of breath. Hospitalist was called for admission for wound infection, cellulits  Assessment & Plan   Right lower extremity cellulitis/Wound -Patient failed outpatient treatment with Bactrim DS -Currently on vancomycin and Zosyn -Wound care has been consulted -Lower extremity Doppler: Negative for DVT, no evidence of Baker's cyst -Cellulitis and wound improving.  Small area of erythema on right arch- also improving. -Will consult physical therapy- as patient requires a walker and needs to mobilize more.   History of breast cancer -Follow-up with oncology upon discharge, oncology made aware patient's admission -Continue Arimidex  CKD Stage 3 -Creatinine currently 1.11, baseline in  previous years is approximately 1 -Patient's GFR continues to be in the stage III range  Osteoarthritis -Status post hip replacement 2010 -Currently stable  History of venous insufficiency -Patient does have good lower extremity pulses -ABI was ordered on patient's right lower extremity however this was not done due to current wound -Patient did have right lower extremity Doppler conducted showing triphasic flow  History of carotid stenosis -Patient should follow-up with her primary care physician as an outpatient  Hypothyroidism -Continue Synthroid  Code Status: Full  Family Communication: None at bedside  Disposition Plan: Admitted.  Continue IV antibiotics until improvement in cellulitis. PT consulted, pending eval.  Time Spent in minutes   30 minutes  Procedures  RLE doppler  Consults   None  DVT Prophylaxis  Lovenox  Lab Results  Component Value Date   PLT 303 03/21/2014    Medications  Scheduled Meds: . anastrozole  1 mg Oral Daily  . antiseptic oral rinse  7 mL Mouth Rinse q12n4p  . chlorhexidine  15 mL Mouth Rinse BID  . docusate sodium  100 mg Oral BID  . enoxaparin (LOVENOX) injection  0.5 mg/kg Subcutaneous Q24H  . levothyroxine  137 mcg Oral QAC breakfast  .  piperacillin-tazobactam (ZOSYN)  IV  3.375 g Intravenous 3 times per day  . sodium chloride  3 mL Intravenous Q12H  . vancomycin  1,250 mg Intravenous Q24H   Continuous Infusions:  PRN Meds:.sodium chloride, acetaminophen **OR** acetaminophen, ondansetron **OR** ondansetron (ZOFRAN) IV, sodium chloride  Antibiotics    Anti-infectives    Start     Dose/Rate Route Frequency Ordered Stop   03/19/14 2200  vancomycin (VANCOCIN) 1,250 mg in sodium chloride 0.9 % 250 mL IVPB     1,250 mg 166.7 mL/hr over 90 Minutes Intravenous Every 24 hours 03/18/14 2103     03/19/14 0600  piperacillin-tazobactam (ZOSYN) IVPB 3.375 g     3.375 g 12.5 mL/hr over 240 Minutes Intravenous 3 times per day 03/19/14  0555     03/18/14 2115  vancomycin (VANCOCIN) 2,000 mg in sodium chloride 0.9 % 500 mL IVPB     2,000 mg 250 mL/hr over 120 Minutes Intravenous STAT 03/18/14 2101 03/18/14 2343   03/18/14 1830  vancomycin (VANCOCIN) IVPB 1000 mg/200 mL premix  Status:  Discontinued     1,000 mg 200 mL/hr over 60 Minutes Intravenous  Once 03/18/14 1821 03/18/14 2100   03/18/14 1830  piperacillin-tazobactam (ZOSYN) IVPB 3.375 g     3.375 g 12.5 mL/hr over 240 Minutes Intravenous  Once 03/18/14 1821 03/18/14 2316        Subjective:   Rachael [name] seen and examined today.  Patient continues complain of pain in her right foot.  She is worried about the discoloration.  She states she has pain with ambulation.  She denies any chest pain, shortness of breath, dizziness, abdominal pain.  Objective:   Filed Vitals:   03/20/14 0542 03/20/14 1518 03/20/14 2202 03/21/14 0551  BP: 100/41 127/42 110/74 108/48  Pulse: 50 63 56 48  Temp: 98.4 F (36.9 C) 99.4 F (37.4 C) 98.6 F (37 C) 97.8 F (36.6 C)  TempSrc: Oral Oral Oral Oral  Resp: 18 16 16 18   Height:      Weight:      SpO2: 93% 95% 97% 95%    Wt Readings from Last 3 Encounters:  03/19/14 107.502 kg (237 lb)  03/14/14 107.502 kg (237 lb)  02/22/14 107.502 kg (237 lb)     Intake/Output Summary (Last 24 hours) at 03/21/14 1108 Last data filed at 03/21/14 1020  Gross per 24 hour  Intake    200 ml  Output      0 ml  Net    200 ml    Exam  General: Well developed, well nourished, NAD  HEENT: NCAT,, mucous membranes moist.   Cardiovascular: S1 S2 auscultated, RRR  Respiratory: Clear to auscultation  Abdomen: Soft, nontender, nondistended, + bowel sounds  Extremities: warm dry without cyanosis clubbing. Trace RLE edema. Venous stasis skin discoloration on LE B/L  Neuro: AAOx3, nonfocal  Skin:  Wound on RLE, small erythematous nodule-improved, noted on patient's arch of the right foot.  Psych: Pleasant, appropriate mood and  affect    Data Review   Micro Results Recent Results (from the past 240 hour(s))  Culture, blood (single)     Status: None (Preliminary result)   Collection Time: 03/18/14  9:00 PM  Result Value Ref Range Status   Specimen Description BLOOD LEFT HAND  Final   Special Requests BOTTLES DRAWN AEROBIC ONLY [place]  Final   Culture   Final           BLOOD CULTURE RECEIVED NO GROWTH TO DATE CULTURE  WILL BE HELD FOR 5 DAYS BEFORE ISSUING A FINAL NEGATIVE REPORT Performed at [facility]    Report Status PENDING  Incomplete  Wound culture     Status: None   Collection Time: 03/18/14 10:47 PM  Result Value Ref Range Status   Specimen Description WOUND RIGHT LEG  Final   Special Requests Normal  Final   Gram Stain   Final    RARE WBC PRESENT, PREDOMINANTLY PMN NO SQUAMOUS EPITHELIAL CELLS SEEN NO ORGANISMS SEEN Performed at [facility]    Culture   Final    MULTIPLE ORGANISMS PRESENT, NONE PREDOMINANT Note: NO STAPHYLOCOCCUS AUREUS ISOLATED NO GROUP A STREP (S.PYOGENES) ISOLATED Performed at [facility]    Report Status 03/21/2014 FINAL  Final    Radiology Reports Dg Tibia/fibula Right  03/18/2014   CLINICAL DATA:  81 year old female with history of right lower extremity wound since 02/21/2014, which now appears infected.  EXAM: RIGHT TIBIA AND FIBULA - 2 VIEW  COMPARISON:  No priors.  FINDINGS: Three views of the right tibia and fibula demonstrate no destructive osseous lesion. No acute displaced fractures. Numerous vascular and soft tissue calcifications.  IMPRESSION: 1. No acute radiographic abnormality of the right tibia or fibula.   Electronically Signed   By: Vinnie Langton M.D.   On: 03/18/2014 17:31    CBC  Recent Labs Lab 03/18/14 1749 03/19/14 0525 03/21/14 0525  WBC 11.7* 9.5 8.9  HGB 12.6 12.0 11.1*  HCT 38.8 36.3 34.9*  PLT 318 268 303  MCV 88.2 88.1 89.9  MCH 28.6 29.1 28.6  MCHC 32.5 33.1 31.8  RDW 13.6 13.7 13.8  LYMPHSABS 1.8  --    --   MONOABS 1.8*  --   --   EOSABS 0.1  --   --   BASOSABS 0.0  --   --     Chemistries   Recent Labs Lab 03/18/14 1749 03/19/14 0525 03/21/14 0525  NA 135 137 140  K 3.6 3.8 3.9  CL 99 102 106  CO2 24 26 26   GLUCOSE 102* 111* 95  BUN 31* 32* 24*  CREATININE 1.34* 1.33* 1.11*  CALCIUM 9.7 9.2 9.1  MG  --  1.9  --   AST  --  16  --   ALT  --  13  --   ALKPHOS  --  74  --   BILITOT  --  1.2  --    ------------------------------------------------------------------------------------------------------------------ estimated creatinine clearance is 53.6 mL/min (by C-G formula based on Cr of 1.11). ------------------------------------------------------------------------------------------------------------------  Recent Labs  03/19/14 0525  HGBA1C 5.7*   ------------------------------------------------------------------------------------------------------------------ No results for input(s): CHOL, HDL, LDLCALC, TRIG, CHOLHDL, LDLDIRECT in the last 72 hours. ------------------------------------------------------------------------------------------------------------------  Recent Labs  03/19/14 0525  TSH 0.458   ------------------------------------------------------------------------------------------------------------------ No results for input(s): VITAMINB12, FOLATE, FERRITIN, TIBC, IRON, RETICCTPCT in the last 72 hours.  Coagulation profile No results for input(s): INR, PROTIME in the last 168 hours.  No results for input(s): DDIMER in the last 72 hours.  Cardiac Enzymes No results for input(s): CKMB, TROPONINI, MYOGLOBIN in the last 168 hours.  Invalid input(s): CK ------------------------------------------------------------------------------------------------------------------ Invalid input(s): POCBNP    Rachael [name] D.O. on 03/21/2014 at 11:08 AM  Between 7am to 7pm - Pager - [phone]  After 7pm go to [email or web address] - password [email or web address]  And look for  the night coverage person covering for me after hours  [facility] Office  [phone]

## 2014-03-21 NOTE — Progress Notes (Signed)
ANTIBIOTIC CONSULT NOTE  Pharmacy Consult for Vancomycin  Indication: Cellulitis  Allergies  Allergen Reactions  . Shellfish Allergy Anaphylaxis and Hives    Hives all over the body.  . Percocet [Oxycodone-Acetaminophen] Hives  . Iodinated Diagnostic Agents     PT IS NOT AWARE OF IODINE ALLERGY, PREMEDICATED FOR PRIOR PREMEDS ONLY//A.C.  . Iodine Hives    Patient Measurements: Height: 5\' 8"  (172.7 cm) Weight: 237 lb (107.502 kg) (from other encounter) IBW/kg (Calculated) : 63.9 Adjusted Body Weight:   Vital Signs: Temp: 97.8 F (36.6 C) (03/10 0551) Temp Source: Oral (03/10 0551) BP: 108/48 mmHg (03/10 0551) Pulse Rate: 48 (03/10 0551) Intake/Output from previous day: 03/09 0701 - 03/10 0700 In: 240 [P.O.:240] Out: -  Intake/Output from this shift: Total I/O In: 200 [P.O.:200] Out: -   Labs:  Recent Labs  03/18/14 1749 03/19/14 0525 03/21/14 0525  WBC 11.7* 9.5 8.9  HGB 12.6 12.0 11.1*  PLT 318 268 303  CREATININE 1.34* 1.33* 1.11*   Estimated Creatinine Clearance: 53.6 mL/min (by C-G formula based on Cr of 1.11). No results for input(s): VANCOTROUGH, VANCOPEAK, VANCORANDOM, GENTTROUGH, GENTPEAK, GENTRANDOM, TOBRATROUGH, TOBRAPEAK, TOBRARND, AMIKACINPEAK, AMIKACINTROU, AMIKACIN in the last 72 hours.   Microbiology: Recent Results (from the past 720 hour(s))  Culture, blood (single)     Status: None (Preliminary result)   Collection Time: 03/18/14  9:00 PM  Result Value Ref Range Status   Specimen Description BLOOD LEFT HAND  Final   Special Requests BOTTLES DRAWN AEROBIC ONLY [place]  Final   Culture   Final           BLOOD CULTURE RECEIVED NO GROWTH TO DATE CULTURE WILL BE HELD FOR 5 DAYS BEFORE ISSUING A FINAL NEGATIVE REPORT Performed at [facility]    Report Status PENDING  Incomplete  Wound culture     Status: None   Collection Time: 03/18/14 10:47 PM  Result Value Ref Range Status   Specimen Description WOUND RIGHT LEG  Final   Special  Requests Normal  Final   Gram Stain   Final    RARE WBC PRESENT, PREDOMINANTLY PMN NO SQUAMOUS EPITHELIAL CELLS SEEN NO ORGANISMS SEEN Performed at [facility]    Culture   Final    MULTIPLE ORGANISMS PRESENT, NONE PREDOMINANT Note: NO STAPHYLOCOCCUS AUREUS ISOLATED NO GROUP A STREP (S.PYOGENES) ISOLATED Performed at [facility]    Report Status 03/21/2014 FINAL  Final    Medical History: Past Medical History  Diagnosis Date  . Chronic venous insufficiency   . PVD (peripheral vascular disease)     mild carotid 11/05  . Osteoarthritis     bilat hips, severe  . Obesity   . CONTACT DERMATITIS   . GLUCOSE INTOLERANCE   . VITAMIN D DEFICIENCY   . Edema     pt  states had edema of feet and legs has been on lasix per dr. to help  . CAROTID STENOSIS   . DUPUYTREN'S CONTRACTURE   . MENOPAUSAL SYNDROME   . HLD (hyperlipidemia)   . Hypothyroidism     Dr. Ala Dach in [place]   . Breast cancer     R   Assessment: Rachael [name] with PMH of venous insufficiency who presents with fever and worsening leg swelling and pain despite starting oral antibiotics last week. Pt sent to ED from PCP for concerns of cellulitis vs. osteomyelitis. X-rays neg for acute abnormality. Empiric Vancomycin started, Zosyn added as pt with possible DM.  Hgb  A1C 5.7  Outpatient Bactrim 3/7 >> Zosyn >> 3/7 >> Vancomycin >>  Tmax: improved to afebrile 3/9 WBCs: Improved to WNL 3/8 Renal: ARF- SCr improved, CrCl 54 CG (N47) (likely 2/2 overdiuresis)  3/7 wound culture: multiple organisms present, none predominant.  No S. aureus or gp A strep isolated 3/7 blood culture x1: NGTD   Goal of Therapy:  Vancomycin trough level 10-15 mcg/ml  Eradication of infection Appropriate dosing of antibiotics for renal function and indication  Plan:   Continue vancomycin 1250 mg IV q24h  Check trough tonight before 4th dose  Continue Zosyn 3.375 g IV q8h by extended infusion  Follow  clinical course, culture results as available, renal function  Follow for de-escalation of antibiotics and LOT   Reuel Boom, PharmD Pager: [phone] 03/21/2014, 2:02 PM

## 2014-03-21 NOTE — Evaluation (Signed)
Physical Therapy Evaluation Patient Details Name: Rachael [name] MRN: [id number] DOB: 11/21/1933 Today's Date: 03/21/2014   History of Present Illness  Pt is a 81 year old female admitted with R LE cellulitis/wound with hx of breast cancer, PVD, OA  Clinical Impression  Pt admitted with above diagnosis. Pt currently with functional limitations due to the deficits listed below (see PT Problem List).  Pt will benefit from skilled PT to increase their independence and safety with mobility to allow discharge to the venue listed below.   Pt with improved gait pattern and pain with increased distance.  Pt very happy to be out of bed and ambulating.  Pt encouraged to sit up in recliner (legs still elevated on pillows) for meals.  Pt reports her daughter is very involved and she has 5 children that would all be able to assist her if needed upon d/c.     Follow Up Recommendations Home health PT    Equipment Recommendations  None recommended by PT    Recommendations for Other Services       Precautions / Restrictions Precautions Precautions: Fall      Mobility  Bed Mobility Overal bed mobility: Modified Independent                Transfers Overall transfer level: Needs assistance Equipment used: Rolling walker (2 wheeled) Transfers: Sit to/from Stand Sit to Stand: Min assist         General transfer comment: verbal cues for safe technique with RW, assist to rise  Ambulation/Gait Ambulation/Gait assistance: Min guard Ambulation Distance (Feet): 80 Feet Assistive device: Rolling walker (2 wheeled) Gait Pattern/deviations: Step-to pattern;Antalgic     General Gait Details: slow pace at first due to R LE pain which pt reports improved with distance and able to tolerate whole foot contact with floor (states fluid pocket under arch)  Stairs            Wheelchair Mobility    Modified Rankin (Stroke Patients Only)       Balance                                             Pertinent Vitals/Pain Pain Assessment: 0-10 Pain Score: 5  Pain Location: R lower leg and foot Pain Descriptors / Indicators: Sore;Tender Pain Intervention(s): Limited activity within patient's tolerance;Monitored during session;Repositioned    Home Living Family/patient expects to be discharged to:: Private residence Living Arrangements: Alone Available Help at Discharge: Family Type of Home: House Home Access: Stairs to enter Entrance Stairs-Rails:  (church to put up rail) Technical brewer of Steps: 4 Home Layout: One level Home Equipment: Environmental consultant - 2 wheels Additional Comments: pt plans to get w/c from church    Prior Function Level of Independence: Independent with assistive device(s)         Comments: using RW more prior to admission due to R LE pain     Hand Dominance        Extremity/Trunk Assessment               Lower Extremity Assessment: Overall WFL for tasks assessed;RLE deficits/detail RLE Deficits / Details: R lower leg and ankle with ace wrap, pt reports edema much improved since admission and able to wiggle toes and perform PF/DF now       Communication   Communication: No difficulties  Cognition Arousal/Alertness: Awake/alert Behavior  During Therapy: WFL for tasks assessed/performed Overall Cognitive Status: Within Functional Limits for tasks assessed                      General Comments      Exercises        Assessment/Plan    PT Assessment Patient needs continued PT services  PT Diagnosis Difficulty walking;Acute pain   PT Problem List Decreased mobility;Pain;Decreased knowledge of use of DME  PT Treatment Interventions DME instruction;Gait training;Functional mobility training;Patient/family education;Stair training;Therapeutic activities;Therapeutic exercise   PT Goals (Current goals can be found in the Care Plan section) Acute Rehab PT Goals PT Goal Formulation: With patient Time For  Goal Achievement: 03/28/14 Potential to Achieve Goals: Good    Frequency Min 3X/week   Barriers to discharge        Co-evaluation               End of Session   Activity Tolerance: Patient tolerated treatment well Patient left: in chair;with call bell/phone within reach Nurse Communication: Mobility status         Time: 5188-4166 PT Time Calculation (min) (ACUTE ONLY): 25 min   Charges:   PT Evaluation $Initial PT Evaluation Tier I: 1 Procedure     PT G Codes:        Rachael [name],Rachael [name] 03/21/2014, 3:56 PM Rachael [name], PT, DPT 03/21/2014 Pager: [phone]

## 2014-03-21 NOTE — Progress Notes (Signed)
Rx Brief Antibiotic note:  Vancomycin  Assessment: 72 yoF with PMH of venous insufficiency who presents with fever and worsening leg swelling and pain despite starting oral antibiotics last week. Pt sent to ED from PCP for concerns of cellulitis vs. osteomyelitis. X-rays neg for acute abnormality. Empiric Vancomycin started, Zosyn added as pt with possible DM. Hgb A1C 5.7  Outpatient Bactrim 3/7 >> Zosyn >> 3/7 >> Vancomycin >>  Tmax: improved to afebrile 3/9 WBCs: Improved to WNL 3/8 Renal: ARF- SCr improved, CrCl 54 CG (N47) (likely 2/2 overdiuresis)  3/7 wound culture: multiple organisms present, none predominant. No S. aureus or gp A strep isolated 3/7 blood culture x1: NGTD 3/10: 2100 VT=12.7 (at goal)  Plan:  Continue Vancomycin 1250mg  IVq24h  F/u Scr/additional levels as needed  Dorrene German 03/21/2014 10:56 PM

## 2014-03-22 ENCOUNTER — Encounter: Payer: PPO | Admitting: Vascular Surgery

## 2014-03-22 ENCOUNTER — Inpatient Hospital Stay (HOSPITAL_COMMUNITY): Payer: PPO

## 2014-03-22 ENCOUNTER — Encounter (HOSPITAL_COMMUNITY): Payer: PPO

## 2014-03-22 DIAGNOSIS — R197 Diarrhea, unspecified: Secondary | ICD-10-CM

## 2014-03-22 LAB — CLOSTRIDIUM DIFFICILE BY PCR: CDIFFPCR: NEGATIVE

## 2014-03-22 MED ORDER — LOPERAMIDE HCL 2 MG PO CAPS
2.0000 mg | ORAL_CAPSULE | ORAL | Status: DC | PRN
  Administered 2014-03-22 – 2014-03-24 (×5): 2 mg via ORAL
  Filled 2014-03-22 (×5): qty 1

## 2014-03-22 MED ORDER — SACCHAROMYCES BOULARDII 250 MG PO CAPS
250.0000 mg | ORAL_CAPSULE | Freq: Two times a day (BID) | ORAL | Status: DC
  Administered 2014-03-22 – 2014-03-24 (×5): 250 mg via ORAL
  Filled 2014-03-22 (×6): qty 1

## 2014-03-22 NOTE — Progress Notes (Signed)
CARE MANAGEMENT NOTE 03/22/2014  Patient:  Buccieri,Lilliemae A   Account Number:  [id number]  Date Initiated:  03/19/2014  Documentation initiated by:  Edwyna Shell  Subjective/Objective Assessment:   81 yo female admitted with wound infection from home     Action/Plan:   discharge planning   Anticipated DC Date:  03/21/2014   Anticipated DC Plan:  [place]  CM consult      [place]   Choice offered to / List presented to:  C-1 Patient           [place].   Status of service:  In process, will continue to follow Medicare Important Message given?  YES (If response is "NO", the following Medicare IM given date fields will be blank) Date Medicare IM given:  03/22/2014 Medicare IM given by:  Edwyna Shell Date Additional Medicare IM given:   Additional Medicare IM given by:    Discharge Disposition:  [place]  Per UR Regulation:    If discussed at Long Length of Stay Meetings, dates discussed:    Comments:  03/22/14 Edwyna Shell RN BSN CM [phone] Spoke with patient regarding PT recommendation for [facility] PT and possibly RN to follow up in the home upon discharge. Provided patient with choice list. Patient stated that she has used [facility] in the past and would like to use them again. Await dsicharge orders.  03/19/14 Edwyna Shell RN BSN CM [phone] Patient lives at home independently, drives self. Has a walker and a cane and just recently had to start using the walker again since the wound on her foot has gotten worse. No CM needs at this time, will continue to follow.

## 2014-03-22 NOTE — Progress Notes (Signed)
Physical Therapy Treatment Patient Details Name: Rachael [name] MRN: [id number] DOB: May 18, 1933 Today's Date: 03/22/2014    History of Present Illness Pt is a 81 year old female admitted with R LE cellulitis/wound with hx of breast cancer, PVD, OA    PT Comments    Pt in bed with R LE elevated.  Stated "it's getting better".  Assited OOB to amb in hallway with RW and increased time. Assisted back to bed.  Pt hopes to D/C to home tomorrow.    Follow Up Recommendations  Home health PT     Equipment Recommendations       Recommendations for Other Services       Precautions / Restrictions Precautions Precautions: Fall    Mobility  Bed Mobility Overal bed mobility: Modified Independent             General bed mobility comments: increased time  Transfers Overall transfer level: Needs assistance Equipment used: Rolling walker (2 wheeled) Transfers: Sit to/from Stand Sit to Stand: Supervision         General transfer comment: good safety tech  Ambulation/Gait Ambulation/Gait assistance: Supervision;Min guard Ambulation Distance (Feet): 85 Feet Assistive device: Rolling walker (2 wheeled) Gait Pattern/deviations: Step-to pattern;Step-through pattern Gait velocity: decreased but functional    General Gait Details: slow pace at first due to R LE pain which pt reports improved with distance and able to tolerate whole foot contact with floor (states fluid pocket under arch)   Stairs            Wheelchair Mobility    Modified Rankin (Stroke Patients Only)       Balance                                    Cognition Arousal/Alertness: Awake/alert Behavior During Therapy: WFL for tasks assessed/performed Overall Cognitive Status: Within Functional Limits for tasks assessed                      Exercises      General Comments        Pertinent Vitals/Pain Pain Assessment: 0-10 Pain Score: 4  Pain Location: R LE  Pain  Descriptors / Indicators: Sore;Tender    Home Living                      Prior Function            PT Goals (current goals can now be found in the care plan section) Progress towards PT goals: Progressing toward goals    Frequency  Min 3X/week    PT Plan      Co-evaluation             End of Session Equipment Utilized During Treatment: Gait belt Activity Tolerance: Patient tolerated treatment well Patient left: in bed;with call bell/phone within reach     Time: 1435-1500 PT Time Calculation (min) (ACUTE ONLY): 25 min  Charges:  $Gait Training: 8-22 mins $Therapeutic Activity: 8-22 mins                    G Codes:      Rica Koyanagi  PTA [facility]  Acute  Rehab Pager      [phone]

## 2014-03-22 NOTE — Progress Notes (Signed)
[facility]                                                                              Patient Demographics  Rachael [name], is a 81 y.o. female, DOB - Jul 20, 1933, EXB:[id number]  Admit date - 03/18/2014   Admitting Physician Toy Baker, MD  Outpatient Primary MD for the patient is Gwendolyn Grant, MD  LOS - 4   Chief Complaint  Patient presents with  . leg infection       HPI on 03/18/2014 by Dr. Toy Baker Patient has hx of venous insufficiency. On Feb 11 th she cut her leg since then she had gradual swelling and pain. Patient was seen at PCP office 5 days ago and was started on Doxycycline she felt there was not relief. Today she started to have fever up to 100.4 and nausea no vomiting. She presented to ER plain films were unremarkable. She is scheduled to see Dr. Scot Dock for her venous insufficiency. Patient states that she have had chronic bilateral leg extremity swelling. Which has been treated by hydrochlorothiazide. For the past 2 weeks she's been taking extra doses of Lasix didn't seem to swell help of leg swelling but decrease her abdominal girth and cause it to lose 8 pounds she denies history of heart failure. Never had an echogram denies any chest pain or shortness of breath. Hospitalist was called for admission for wound infection, cellulits  Assessment & Plan   Right lower extremity cellulitis/Wound -Patient failed outpatient treatment with Bactrim DS -Currently on vancomycin and Zosyn -Wound care has been consulted -Lower extremity Doppler: Negative for DVT, no evidence of Baker's cyst -Cellulitis and wound improving.  Small area of erythema on right arch- unchanged -PT consulted and recommended home health -Repeat foot xray show no acute findings.   Diarrhea -It seems that patient developed diarrhea overnight and had several episodes -Cdiff negative -Likely secondary to antibiotics -Will place on florastor and imodium   History of breast  cancer -Follow-up with oncology upon discharge, oncology made aware patient's admission -Continue Arimidex  CKD Stage 3 -Creatinine currently 1.11, baseline in previous years is approximately 1 -Patient's GFR continues to be in the stage III range  Osteoarthritis -Status post hip replacement 2010 -Currently stable  History of venous insufficiency -Patient does have good lower extremity pulses -ABI was ordered on patient's right lower extremity however this was not done due to current wound -Patient did have right lower extremity Doppler conducted showing triphasic flow  History of carotid stenosis -Patient should follow-up with her primary care physician as an outpatient  Hypothyroidism -Continue Synthroid  Code Status: Full  Family Communication: None at bedside  Disposition Plan: Admitted.  Will likely dc 3/12.    Time Spent in minutes   30 minutes  Procedures  RLE doppler  Consults   None  DVT Prophylaxis  Lovenox  Lab Results  Component Value Date   PLT 303 03/21/2014    Medications  Scheduled Meds: . anastrozole  1 mg Oral Daily  . antiseptic oral rinse  7 mL Mouth Rinse q12n4p  . chlorhexidine  15 mL Mouth Rinse BID  . docusate sodium  100 mg Oral BID  .  enoxaparin (LOVENOX) injection  0.5 mg/kg Subcutaneous Q24H  . levothyroxine  137 mcg Oral QAC breakfast  . piperacillin-tazobactam (ZOSYN)  IV  3.375 g Intravenous 3 times per day  . saccharomyces boulardii  250 mg Oral BID  . sodium chloride  3 mL Intravenous Q12H  . vancomycin  1,250 mg Intravenous Q24H   Continuous Infusions:  PRN Meds:.sodium chloride, acetaminophen **OR** acetaminophen, loperamide, ondansetron **OR** ondansetron (ZOFRAN) IV, sodium chloride  Antibiotics    Anti-infectives    Start     Dose/Rate Route Frequency Ordered Stop   03/19/14 2200  vancomycin (VANCOCIN) 1,250 mg in sodium chloride 0.9 % 250 mL IVPB     1,250 mg 166.7 mL/hr over 90 Minutes Intravenous Every 24  hours 03/18/14 2103     03/19/14 0600  piperacillin-tazobactam (ZOSYN) IVPB 3.375 g     3.375 g 12.5 mL/hr over 240 Minutes Intravenous 3 times per day 03/19/14 0555     03/18/14 2115  vancomycin (VANCOCIN) 2,000 mg in sodium chloride 0.9 % 500 mL IVPB     2,000 mg 250 mL/hr over 120 Minutes Intravenous STAT 03/18/14 2101 03/18/14 2343   03/18/14 1830  vancomycin (VANCOCIN) IVPB 1000 mg/200 mL premix  Status:  Discontinued     1,000 mg 200 mL/hr over 60 Minutes Intravenous  Once 03/18/14 1821 03/18/14 2100   03/18/14 1830  piperacillin-tazobactam (ZOSYN) IVPB 3.375 g     3.375 g 12.5 mL/hr over 240 Minutes Intravenous  Once 03/18/14 1821 03/18/14 2316        Subjective:   Rachael [name] seen and examined today.  Patient continues complain of pain in her right foot especially with walking.  She also complains of continued diarrhea and poor appetite with nausea. Denies any chest pain, shortness of breath, dizziness, abdominal pain.  Objective:   Filed Vitals:   03/21/14 0551 03/21/14 1400 03/21/14 2120 03/22/14 0605  BP: 108/48 115/47 115/43 116/58  Pulse: 48 56 62 52  Temp: 97.8 F (36.6 C) 98.6 F (37 C) 98.9 F (37.2 C) 98.4 F (36.9 C)  TempSrc: Oral Oral Oral Oral  Resp: 18 18 16 18   Height:      Weight:      SpO2: 95% 98% 97% 96%    Wt Readings from Last 3 Encounters:  03/19/14 107.502 kg (237 lb)  03/14/14 107.502 kg (237 lb)  02/22/14 107.502 kg (237 lb)     Intake/Output Summary (Last 24 hours) at 03/22/14 1152 Last data filed at 03/21/14 1230  Gross per 24 hour  Intake    240 ml  Output      0 ml  Net    240 ml    Exam  General: Well developed, well nourished, NAD  HEENT: NCAT,, mucous membranes moist.   Cardiovascular: S1 S2 auscultated, RRR  Respiratory: Clear to auscultation  Abdomen: soft, obese, nontender, nondistended, +BS  Extremities: Trace RLE edema. Venous stasis skin discoloration on LE B/L  Skin:  Wound on RLE, small erythematous  nodule-improved, noted on patient's arch of the right foot.  Psych: Pleasant, appropriate mood and affect  Data Review   Micro Results Recent Results (from the past 240 hour(s))  Culture, blood (single)     Status: None (Preliminary result)   Collection Time: 03/18/14  9:00 PM  Result Value Ref Range Status   Specimen Description BLOOD LEFT HAND  Final   Special Requests BOTTLES DRAWN AEROBIC ONLY [facility]  Final   Culture   Final  BLOOD CULTURE RECEIVED NO GROWTH TO DATE CULTURE WILL BE HELD FOR 5 DAYS BEFORE ISSUING A FINAL NEGATIVE REPORT Performed at [facility]    Report Status PENDING  Incomplete  Wound culture     Status: None   Collection Time: 03/18/14 10:47 PM  Result Value Ref Range Status   Specimen Description WOUND RIGHT LEG  Final   Special Requests Normal  Final   Gram Stain   Final    RARE WBC PRESENT, PREDOMINANTLY PMN NO SQUAMOUS EPITHELIAL CELLS SEEN NO ORGANISMS SEEN Performed at [facility]    Culture   Final    MULTIPLE ORGANISMS PRESENT, NONE PREDOMINANT Note: NO STAPHYLOCOCCUS AUREUS ISOLATED NO GROUP A STREP (S.PYOGENES) ISOLATED Performed at [facility]    Report Status 03/21/2014 FINAL  Final  Clostridium Difficile by PCR     Status: None   Collection Time: 03/22/14  7:37 AM  Result Value Ref Range Status   C difficile by pcr NEGATIVE NEGATIVE Final    Radiology Reports Dg Tibia/fibula Right  03/18/2014   CLINICAL DATA:  81 year old female with history of right lower extremity wound since 02/21/2014, which now appears infected.  EXAM: RIGHT TIBIA AND FIBULA - 2 VIEW  COMPARISON:  No priors.  FINDINGS: Three views of the right tibia and fibula demonstrate no destructive osseous lesion. No acute displaced fractures. Numerous vascular and soft tissue calcifications.  IMPRESSION: 1. No acute radiographic abnormality of the right tibia or fibula.   Electronically Signed   By: Vinnie Langton M.D.   On: 03/18/2014 17:31     Dg Foot 2 Views Right  03/22/2014   CLINICAL DATA:  Pain and swelling of right foot.  EXAM: RIGHT FOOT - 2 VIEW  COMPARISON:  None.  FINDINGS: No evidence of fracture or dislocation. Mild degenerative changes are seen involving interphalangeal joints of all of the toes. No bony lesions or destruction identified. Lateral view shows plantar and posterior calcaneal spurs. Soft tissues are grossly unremarkable without foreign body visualized.  IMPRESSION: No acute findings.  Mild degenerative changes of the toes.   Electronically Signed   By: Aletta Edouard M.D.   On: 03/22/2014 09:52    CBC  Recent Labs Lab 03/18/14 1749 03/19/14 0525 03/21/14 0525  WBC 11.7* 9.5 8.9  HGB 12.6 12.0 11.1*  HCT 38.8 36.3 34.9*  PLT 318 268 303  MCV 88.2 88.1 89.9  MCH 28.6 29.1 28.6  MCHC 32.5 33.1 31.8  RDW 13.6 13.7 13.8  LYMPHSABS 1.8  --   --   MONOABS 1.8*  --   --   EOSABS 0.1  --   --   BASOSABS 0.0  --   --     Chemistries   Recent Labs Lab 03/18/14 1749 03/19/14 0525 03/21/14 0525  NA 135 137 140  K 3.6 3.8 3.9  CL 99 102 106  CO2 24 26 26   GLUCOSE 102* 111* 95  BUN 31* 32* 24*  CREATININE 1.34* 1.33* 1.11*  CALCIUM 9.7 9.2 9.1  MG  --  1.9  --   AST  --  16  --   ALT  --  13  --   ALKPHOS  --  74  --   BILITOT  --  1.2  --    ------------------------------------------------------------------------------------------------------------------ estimated creatinine clearance is 53.6 mL/min (by C-G formula based on Cr of 1.11). ------------------------------------------------------------------------------------------------------------------ No results for input(s): HGBA1C in the last 72 hours. ------------------------------------------------------------------------------------------------------------------ No results for input(s): CHOL,  HDL, LDLCALC, TRIG, CHOLHDL, LDLDIRECT in the last 72  hours. ------------------------------------------------------------------------------------------------------------------ No results for input(s): TSH, T4TOTAL, T3FREE, THYROIDAB in the last 72 hours.  Invalid input(s): FREET3 ------------------------------------------------------------------------------------------------------------------ No results for input(s): VITAMINB12, FOLATE, FERRITIN, TIBC, IRON, RETICCTPCT in the last 72 hours.  Coagulation profile No results for input(s): INR, PROTIME in the last 168 hours.  No results for input(s): DDIMER in the last 72 hours.  Cardiac Enzymes No results for input(s): CKMB, TROPONINI, MYOGLOBIN in the last 168 hours.  Invalid input(s): CK ------------------------------------------------------------------------------------------------------------------ Invalid input(s): POCBNP    Alexzandra Bilton D.O. on 03/22/2014 at 11:52 AM  Between 7am to 7pm - Pager - [phone]  After 7pm go to [email or web address] - password [email or web address]  And look for the night coverage person covering for me after hours  [facility] Office  [phone]

## 2014-03-22 NOTE — Progress Notes (Signed)
CARE MANAGEMENT NOTE 03/22/2014  Patient:  Rachael [name],Rachael [name]   Account Number:  [id number]  Date Initiated:  03/19/2014  Documentation initiated by:  Edwyna Shell  Subjective/Objective Assessment:   81 yo female admitted with wound infection from home     Action/Plan:   discharge planning   Anticipated DC Date:  03/21/2014   Anticipated DC Plan:  [place]  CM consult      [place]   Choice offered to / List presented to:  C-1 Patient        [place] arranged  HH-1 RN  [place].   Status of service:  Completed, signed off Medicare Important Message given?  YES (If response is "NO", the following Medicare IM given date fields will be blank) Date Medicare IM given:  03/22/2014 Medicare IM given by:  Edwyna Shell Date Additional Medicare IM given:   Additional Medicare IM given by:    Discharge Disposition:  [place]  Per UR Regulation:    If discussed at Long Length of Stay Meetings, dates discussed:    Comments:  03/22/14 Edwyna Shell RN BSN CM [phone] Referral for [facility] PT and RN [facility] [place].  03/22/14 Edwyna Shell RN BSN CM [phone] Spoke with patient regarding PT recommendation for [facility] PT and possibly RN to follow up in the home upon discharge. Provided patient with choice list. Patient stated that she has used [facility] in the past and would like to use them again. Await dsicharge orders.  03/19/14 Edwyna Shell RN BSN CM [phone] Patient lives at home independently, drives self. Has [name] walker and [name] cane and just recently had to start using the walker again since the wound on her foot has gotten worse. No CM needs at this time, will continue to follow.

## 2014-03-23 LAB — BASIC METABOLIC PANEL
Anion gap: 7 (ref 5–15)
BUN: 23 mg/dL (ref 6–23)
CO2: 27 mmol/L (ref 19–32)
Calcium: 9.1 mg/dL (ref 8.4–10.5)
Chloride: 107 mmol/L (ref 96–112)
Creatinine, Ser: 1.08 mg/dL (ref 0.50–1.10)
GFR calc non Af Amer: 48 mL/min — ABNORMAL LOW (ref >90)
GFR, EST AFRICAN AMERICAN: 55 mL/min — AB (ref >90)
Glucose, Bld: 102 mg/dL — ABNORMAL HIGH (ref 70–99)
POTASSIUM: 3.8 mmol/L (ref 3.5–5.1)
Sodium: 141 mmol/L (ref 135–145)

## 2014-03-23 LAB — CBC
HEMATOCRIT: 34.2 % — AB (ref 36.0–46.0)
HEMOGLOBIN: 11 g/dL — AB (ref 12.0–15.0)
MCH: 28.7 pg (ref 26.0–34.0)
MCHC: 32.2 g/dL (ref 30.0–36.0)
MCV: 89.3 fL (ref 78.0–100.0)
PLATELETS: 318 10*3/uL (ref 150–400)
RBC: 3.83 MIL/uL — ABNORMAL LOW (ref 3.87–5.11)
RDW: 13.5 % (ref 11.5–15.5)
WBC: 8.1 10*3/uL (ref 4.0–10.5)

## 2014-03-23 LAB — URIC ACID: URIC ACID, SERUM: 4.6 mg/dL (ref 2.4–7.0)

## 2014-03-23 MED ORDER — INDOMETHACIN 25 MG PO CAPS
25.0000 mg | ORAL_CAPSULE | Freq: Two times a day (BID) | ORAL | Status: DC
  Administered 2014-03-23 – 2014-03-24 (×3): 25 mg via ORAL
  Filled 2014-03-23 (×4): qty 1

## 2014-03-23 NOTE — Progress Notes (Signed)
[facility]                                                                              Patient Demographics  Rachael [name], is a 81 y.o. female, DOB - [birth year], RWE:[id number]  Admit date - 03/18/2014   Admitting Physician Toy Baker, MD  Outpatient Primary MD for the patient is Gwendolyn Grant, MD  LOS - 5   Chief Complaint  Patient presents with  . leg infection       HPI on 03/18/2014 by Dr. Toy Baker Patient has hx of venous insufficiency. On Feb 11 th she cut her leg since then she had gradual swelling and pain. Patient was seen at PCP office 5 days ago and was started on Doxycycline she felt there was not relief. Today she started to have fever up to 100.4 and nausea no vomiting. She presented to ER plain films were unremarkable. She is scheduled to see Dr. Scot Dock for her venous insufficiency. Patient states that she have had chronic bilateral leg extremity swelling. Which has been treated by hydrochlorothiazide. For the past 2 weeks she's been taking extra doses of Lasix didn't seem to swell help of leg swelling but decrease her abdominal girth and cause it to lose 8 pounds she denies history of heart failure. Never had an echogram denies any chest pain or shortness of breath. Hospitalist was called for admission for wound infection, cellulits  Assessment & Plan   Right lower extremity cellulitis/Wound -Patient failed outpatient treatment with Bactrim DS -Currently on vancomycin and Zosyn -Wound care has been consulted -Lower extremity Doppler: Negative for DVT, no evidence of Baker's cyst -Cellulitis and wound improving.   -PT consulted and recommended home health -Repeat foot xray show no acute findings.   ?Gout -Patient started having a small area of erythema on the arch of her right foot -Xrays obtained: no acute findings, mild degenerative changes of the toes, no bony lesions or destruction -Overnight, erythema spread and only affected her  second toe, which is currently swollen and extremely tender to touch -patient has no history of gout; patient has good pedal pulses and only affecting one toe- unlikely vasculitis -Obtain uric acid level -Start patient on indocine -Spoke with Dr. Hassell Done, surgery, who felt this to possibly be gout and that aspiration would cause more pain and not yield much.  Continue to monitor, call for consult if worsens.  Diarrhea -It seems that patient developed diarrhea overnight and had several episodes -Cdiff negative -Likely secondary to antibiotics -Continue florastor and imodium   History of breast cancer -Follow-up with oncology upon discharge, oncology made aware patient's admission -Continue Arimidex  CKD Stage 3 -Creatinine currently 1.11, baseline in previous years is approximately 1 -Patient's GFR continues to be in the stage III range  Osteoarthritis -Status post hip replacement 2010 -Currently stable  History of venous insufficiency -Patient does have good lower extremity pulses -ABI was ordered on patient's right lower extremity however this was not done due to current wound -Patient did have right lower extremity Doppler conducted showing triphasic flow  History of carotid stenosis -Patient should follow-up with her primary care physician as an outpatient  Hypothyroidism -Continue Synthroid  Code  Status: Full  Family Communication: None at bedside  Disposition Plan: Admitted.  Will likely dc 3/13 if toe erythema improves.    Time Spent in minutes   30 minutes  Procedures  RLE doppler  Consults   None  DVT Prophylaxis  Lovenox  Lab Results  Component Value Date   PLT 318 03/23/2014    Medications  Scheduled Meds: . anastrozole  1 mg Oral Daily  . antiseptic oral rinse  7 mL Mouth Rinse q12n4p  . chlorhexidine  15 mL Mouth Rinse BID  . docusate sodium  100 mg Oral BID  . enoxaparin (LOVENOX) injection  0.5 mg/kg Subcutaneous Q24H  . indomethacin  25 mg  Oral BID WC  . levothyroxine  137 mcg Oral QAC breakfast  . piperacillin-tazobactam (ZOSYN)  IV  3.375 g Intravenous 3 times per day  . saccharomyces boulardii  250 mg Oral BID  . sodium chloride  3 mL Intravenous Q12H  . vancomycin  1,250 mg Intravenous Q24H   Continuous Infusions:  PRN Meds:.sodium chloride, acetaminophen **OR** acetaminophen, loperamide, ondansetron **OR** ondansetron (ZOFRAN) IV, sodium chloride  Antibiotics    Anti-infectives    Start     Dose/Rate Route Frequency Ordered Stop   03/19/14 2200  vancomycin (VANCOCIN) 1,250 mg in sodium chloride 0.9 % 250 mL IVPB     1,250 mg 166.7 mL/hr over 90 Minutes Intravenous Every 24 hours 03/18/14 2103     03/19/14 0600  piperacillin-tazobactam (ZOSYN) IVPB 3.375 g     3.375 g 12.5 mL/hr over 240 Minutes Intravenous 3 times per day 03/19/14 0555     03/18/14 2115  vancomycin (VANCOCIN) 2,000 mg in sodium chloride 0.9 % 500 mL IVPB     2,000 mg 250 mL/hr over 120 Minutes Intravenous STAT 03/18/14 2101 03/18/14 2343   03/18/14 1830  vancomycin (VANCOCIN) IVPB 1000 mg/200 mL premix  Status:  Discontinued     1,000 mg 200 mL/hr over 60 Minutes Intravenous  Once 03/18/14 1821 03/18/14 2100   03/18/14 1830  piperacillin-tazobactam (ZOSYN) IVPB 3.375 g     3.375 g 12.5 mL/hr over 240 Minutes Intravenous  Once 03/18/14 1821 03/18/14 2316        Subjective:   Rachael [name] seen and examined today.  Patient complains of pain, redness, swelling in her right foot.  Overnight, the redness improved on top of her foot, but her second toe is currently swollen and tender to touch.   Denies any chest pain, shortness of breath, dizziness, abdominal pain.  Objective:   Filed Vitals:   03/22/14 0605 03/22/14 1429 03/22/14 2126 03/23/14 0528  BP: 116/58 108/42 127/38 115/45  Pulse: 52 54 56 50  Temp: 98.4 F (36.9 C) 98.1 F (36.7 C) 99 F (37.2 C) 98.1 F (36.7 C)  TempSrc: Oral Oral Oral Oral  Resp: 18 18 18 18   Height:        Weight:      SpO2: 96% 100% 97% 95%    Wt Readings from Last 3 Encounters:  03/19/14 107.502 kg (237 lb)  03/14/14 107.502 kg (237 lb)  02/22/14 107.502 kg (237 lb)     Intake/Output Summary (Last 24 hours) at 03/23/14 1016 Last data filed at 03/22/14 1814  Gross per 24 hour  Intake    240 ml  Output      0 ml  Net    240 ml    Exam  General: Well developed, well nourished  HEENT: NCAT, mucous membranes moist.  Cardiovascular: S1 S2 auscultated, RRR  Respiratory: Clear to auscultation  Extremities: Trace RLE edema. Venous stasis skin discoloration on LE B/L  Skin:  Wound on RLE, small erythematous nodule-improved, noted on patient's arch of the right foot- erythema and edema 2nd toe, TTP  Psych: Pleasant, appropriate mood and affect  Data Review   Micro Results Recent Results (from the past 240 hour(s))  Culture, blood (single)     Status: None (Preliminary result)   Collection Time: 03/18/14  9:00 PM  Result Value Ref Range Status   Specimen Description BLOOD LEFT HAND  Final   Special Requests BOTTLES DRAWN AEROBIC ONLY [place]  Final   Culture   Final           BLOOD CULTURE RECEIVED NO GROWTH TO DATE CULTURE WILL BE HELD FOR 5 DAYS BEFORE ISSUING A FINAL NEGATIVE REPORT Performed at [facility]    Report Status PENDING  Incomplete  Wound culture     Status: None   Collection Time: 03/18/14 10:47 PM  Result Value Ref Range Status   Specimen Description WOUND RIGHT LEG  Final   Special Requests Normal  Final   Gram Stain   Final    RARE WBC PRESENT, PREDOMINANTLY PMN NO SQUAMOUS EPITHELIAL CELLS SEEN NO ORGANISMS SEEN Performed at [facility]    Culture   Final    MULTIPLE ORGANISMS PRESENT, NONE PREDOMINANT Note: NO STAPHYLOCOCCUS AUREUS ISOLATED NO GROUP A STREP (S.PYOGENES) ISOLATED Performed at [facility]    Report Status 03/21/2014 FINAL  Final  Clostridium Difficile by PCR     Status: None   Collection Time:  03/22/14  7:37 AM  Result Value Ref Range Status   C difficile by pcr NEGATIVE NEGATIVE Final    Radiology Reports Dg Tibia/fibula Right  03/18/2014   CLINICAL DATA:  81 year old female with history of right lower extremity wound since 02/21/2014, which now appears infected.  EXAM: RIGHT TIBIA AND FIBULA - 2 VIEW  COMPARISON:  No priors.  FINDINGS: Three views of the right tibia and fibula demonstrate no destructive osseous lesion. No acute displaced fractures. Numerous vascular and soft tissue calcifications.  IMPRESSION: 1. No acute radiographic abnormality of the right tibia or fibula.   Electronically Signed   By: Vinnie Langton M.D.   On: 03/18/2014 17:31   Dg Foot 2 Views Right  03/22/2014   CLINICAL DATA:  Pain and swelling of right foot.  EXAM: RIGHT FOOT - 2 VIEW  COMPARISON:  None.  FINDINGS: No evidence of fracture or dislocation. Mild degenerative changes are seen involving interphalangeal joints of all of the toes. No bony lesions or destruction identified. Lateral view shows plantar and posterior calcaneal spurs. Soft tissues are grossly unremarkable without foreign body visualized.  IMPRESSION: No acute findings.  Mild degenerative changes of the toes.   Electronically Signed   By: Aletta Edouard M.D.   On: 03/22/2014 09:52    CBC  Recent Labs Lab 03/18/14 1749 03/19/14 0525 03/21/14 0525 03/23/14 0543  WBC 11.7* 9.5 8.9 8.1  HGB 12.6 12.0 11.1* 11.0*  HCT 38.8 36.3 34.9* 34.2*  PLT 318 268 303 318  MCV 88.2 88.1 89.9 89.3  MCH 28.6 29.1 28.6 28.7  MCHC 32.5 33.1 31.8 32.2  RDW 13.6 13.7 13.8 13.5  LYMPHSABS 1.8  --   --   --   MONOABS 1.8*  --   --   --   EOSABS 0.1  --   --   --  BASOSABS 0.0  --   --   --     Chemistries   Recent Labs Lab 03/18/14 1749 03/19/14 0525 03/21/14 0525 03/23/14 0543  NA 135 137 140 141  K 3.6 3.8 3.9 3.8  CL 99 102 106 107  CO2 24 26 26 27   GLUCOSE 102* 111* 95 102*  BUN 31* 32* 24* 23  CREATININE 1.34* 1.33* 1.11* 1.08   CALCIUM 9.7 9.2 9.1 9.1  MG  --  1.9  --   --   AST  --  16  --   --   ALT  --  13  --   --   ALKPHOS  --  74  --   --   BILITOT  --  1.2  --   --    ------------------------------------------------------------------------------------------------------------------ estimated creatinine clearance is 55.1 mL/min (by C-G formula based on Cr of 1.08). ------------------------------------------------------------------------------------------------------------------ No results for input(s): HGBA1C in the last 72 hours. ------------------------------------------------------------------------------------------------------------------ No results for input(s): CHOL, HDL, LDLCALC, TRIG, CHOLHDL, LDLDIRECT in the last 72 hours. ------------------------------------------------------------------------------------------------------------------ No results for input(s): TSH, T4TOTAL, T3FREE, THYROIDAB in the last 72 hours.  Invalid input(s): FREET3 ------------------------------------------------------------------------------------------------------------------ No results for input(s): VITAMINB12, FOLATE, FERRITIN, TIBC, IRON, RETICCTPCT in the last 72 hours.  Coagulation profile No results for input(s): INR, PROTIME in the last 168 hours.  No results for input(s): DDIMER in the last 72 hours.  Cardiac Enzymes No results for input(s): CKMB, TROPONINI, MYOGLOBIN in the last 168 hours.  Invalid input(s): CK ------------------------------------------------------------------------------------------------------------------ Invalid input(s): POCBNP    Rachael [name] D.O. on 03/23/2014 at 10:16 AM  Between 7am to 7pm - Pager - [phone]  After 7pm go to [email or web address] - password [email or web address]  And look for the night coverage person covering for me after hours  [facility] Office  [phone]

## 2014-03-24 LAB — BASIC METABOLIC PANEL
Anion gap: 10 (ref 5–15)
BUN: 23 mg/dL (ref 6–23)
CHLORIDE: 107 mmol/L (ref 96–112)
CO2: 26 mmol/L (ref 19–32)
CREATININE: 0.99 mg/dL (ref 0.50–1.10)
Calcium: 9.3 mg/dL (ref 8.4–10.5)
GFR calc non Af Amer: 53 mL/min — ABNORMAL LOW (ref >90)
GFR, EST AFRICAN AMERICAN: 62 mL/min — AB (ref >90)
Glucose, Bld: 100 mg/dL — ABNORMAL HIGH (ref 70–99)
POTASSIUM: 3.8 mmol/L (ref 3.5–5.1)
Sodium: 143 mmol/L (ref 135–145)

## 2014-03-24 MED ORDER — LOPERAMIDE HCL 2 MG PO CAPS: 2.0000 mg | ORAL_CAPSULE | ORAL | Status: DC | PRN

## 2014-03-24 MED ORDER — TRIAMTERENE-HCTZ 37.5-25 MG PO CAPS: 1.0000 | ORAL_CAPSULE | ORAL | Status: DC

## 2014-03-24 MED ORDER — DOCUSATE SODIUM 100 MG PO CAPS: 100.0000 mg | ORAL_CAPSULE | Freq: Two times a day (BID) | ORAL | Status: DC

## 2014-03-24 MED ORDER — DOXYCYCLINE MONOHYDRATE 100 MG PO CAPS
100.0000 mg | ORAL_CAPSULE | Freq: Two times a day (BID) | ORAL | Status: DC
Start: 2014-03-24 — End: 2014-03-28

## 2014-03-24 MED ORDER — INDOMETHACIN 25 MG PO CAPS: 25.0000 mg | ORAL_CAPSULE | Freq: Two times a day (BID) | ORAL | Status: DC

## 2014-03-24 MED ORDER — SACCHAROMYCES BOULARDII 250 MG PO CAPS: 250.0000 mg | ORAL_CAPSULE | Freq: Two times a day (BID) | ORAL | Status: DC

## 2014-03-24 NOTE — Discharge Instructions (Signed)

## 2014-03-24 NOTE — Progress Notes (Signed)
Discharge instructions and medications reviewed with patient. Patient verbalizes understanding and has no questions at this time. Patient confirms she has all personal belongings in her possession at this time. Patient discharged home.

## 2014-03-24 NOTE — Discharge Summary (Addendum)
Physician Discharge Summary  Rachael [name] YQM:[id number] DOB: [birth year] DOA: 03/18/2014  PCP: Gwendolyn Grant, MD  Admit date: 03/18/2014 Discharge date: 03/24/2014  Time spent: 45 minutes  Recommendations for Outpatient Follow-up:  Patient will be discharged home with home health physical therapy. She should continue taking her medications as prescribed. Patient will be given a short course of Indocin as well as doxycycline. Patient should hold her HCTZ/triamterene for the time being and discuss possible cessation of this medication with her primary care doctor. Side effects of doxycycline were discussed. She should resume a regular diet. Patient will need to follow up with her PCP in 1-2 weeks.   Discharge Diagnoses:  Right lower extremity cellulitis/wound Questionable gout Diarrhea History of breast cancer CAD stage III Osteoarthritis History of venous insufficiency History of carotid stenosis Hypothyroidism  Discharge Condition: Stable  Diet recommendation: Regular  Filed Weights   03/19/14 1410  Weight: 107.502 kg (237 lb)    History of present illness:  on 03/18/2014 by Dr. Toy Baker Patient has hx of venous insufficiency. On Feb 11 th she cut her leg since then she had gradual swelling and pain. Patient was seen at PCP office 5 days ago and was started on Doxycycline she felt there was not relief. Today she started to have fever up to 100.4 and nausea no vomiting. She presented to ER plain films were unremarkable. She is scheduled to see Dr. Scot Dock for her venous insufficiency. Patient states that she have had chronic bilateral leg extremity swelling. Which has been treated by hydrochlorothiazide. For the past 2 weeks she's been taking extra doses of Lasix didn't seem to swell help of leg swelling but decrease her abdominal girth and cause it to lose 8 pounds she denies history of heart failure. Never had an echogram denies any chest pain or shortness of breath.   Hospitalist was called for admission for wound infection, cellulits  Hospital Course:  Right lower extremity cellulitis/Wound -Patient failed outpatient treatment with Bactrim DS -Initially placed on vancomycin and Zosyn, will discharge patient with doxycycline for an additional 5 days -Wound care has been consulted -Lower extremity Doppler: Negative for DVT, no evidence of Baker's cyst -Cellulitis and wound improving.  -PT consulted and recommended home health -Repeat foot xray show no acute findings.  -Spoke with patient regarding side effects of doxycycline  ?Gout -Patient started having a small area of erythema on the arch of her right foot -Not entirely convinced that this is gout or even pseudogout given the presentation and location -Xrays obtained: no acute findings, mild degenerative changes of the toes, no bony lesions or destruction -Overnight, erythema spread and only affected her second toe, which is currently swollen and extremely tender to touch -patient has no history of gout; patient has good pedal pulses and only affecting one toe- unlikely vasculitis -Uric acid 4.6 (within normal range) -Patient started on low dose indocin which has helped her pain  -Will continue indocin 25mg  BID for an additional 3 days -Spoke with Dr. Hassell Done, surgery, who felt this to possibly be gout and that aspiration would cause more pain and not yield much. Continue to monitor, call for consult if worsens. -instructed patient to hold Dyazide until foot heals and she is no longer on indocin. Patient may want to speak with her PCP regarding continuation of dyazide  Diarrhea -It seems that patient developed diarrhea overnight and had several episodes -Cdiff negative -Likely secondary to antibiotics -Continue florastor and imodium   History of breast cancer -  Follow-up with oncology upon discharge, oncology made aware patient's admission -Continue Arimidex  CKD Stage 3 -Creatinine  currently 1.11, baseline in previous years is approximately 1 -Patient's GFR continues to be in the stage III range  Osteoarthritis -Status post hip replacement 2010 -Currently stable  History of venous insufficiency -Patient does have good lower extremity pulses -ABI was ordered on patient's right lower extremity however this was not done due to current wound -Patient did have right lower extremity Doppler conducted showing triphasic flow  History of carotid stenosis -Patient should follow-up with her primary care physician as an outpatient  Hypothyroidism -Continue Synthroid  Procedures  RLE doppler  Consults  None  Discharge Exam: Filed Vitals:   03/24/14 0619  BP: 110/55  Pulse:   Temp:   Resp:    Exam  General: Well developed, well nourished, NAD  HEENT: NCAT, mucous membranes moist.   Cardiovascular: S1 S2 auscultated, RRR  Respiratory: Clear to auscultation  Extremities: No clubbing, cyanosis, edema. Venous stasis skin discoloration on LE B/L  Skin: Erythema and swelling improving on patient's right foot and second toe  Psych: Pleasant, appropriate mood and affect  Discharge Instructions      Discharge Instructions    Discharge instructions    Complete by:  As directed   Patient will be discharged home with home health physical therapy. She should continue taking her medications as prescribed. Patient will be given a short course of Indocin as well as doxycycline. Patient should hold her HCTZ/triamterene for the time being and discuss possible cessation of this medication with her primary care doctor. Side effects of doxycycline were discussed. She should resume a regular diet.            Medication List    STOP taking these medications        sulfamethoxazole-trimethoprim 800-160 MG per tablet  Commonly known as:  BACTRIM DS,SEPTRA DS      TAKE these medications        anastrozole 1 MG tablet  Commonly known as:  ARIMIDEX  Take 1  tablet (1 mg total) by mouth daily.     B-12 PO  Take 1 tablet by mouth daily.     cholecalciferol 1000 UNITS tablet  Commonly known as:  VITAMIN D  Take 2,000 Units by mouth daily.     docusate sodium 100 MG capsule  Commonly known as:  COLACE  Take 1 capsule (100 mg total) by mouth 2 (two) times daily.     doxycycline 100 MG capsule  Commonly known as:  MONODOX  Take 1 capsule (100 mg total) by mouth 2 (two) times daily.     indomethacin 25 MG capsule  Commonly known as:  INDOCIN  Take 1 capsule (25 mg total) by mouth 2 (two) times daily with a meal.     JUICE PLUS FIBRE Liqd  Take 4 capsules by mouth 3 (three) times daily. 4 vegetable capsules with breakfast, 4 fruit capsules with lunch and 4 berry capsules with dinner.     KENALOG EX  Apply 1 application topically 2 (two) times daily as needed (legs).     levothyroxine 137 MCG tablet  Commonly known as:  SYNTHROID, LEVOTHROID  Take 137 mcg by mouth daily.     loperamide 2 MG capsule  Commonly known as:  IMODIUM  Take 1 capsule (2 mg total) by mouth as needed for diarrhea or loose stools.     saccharomyces boulardii 250 MG capsule  Commonly known as:  FLORASTOR  Take 1 capsule (250 mg total) by mouth 2 (two) times daily.     triamterene-hydrochlorothiazide 37.5-25 MG per capsule  Commonly known as:  DYAZIDE  Take 1 each (1 capsule total) by mouth every morning.  Start taking on:  03/27/2014       Allergies  Allergen Reactions  . Shellfish Allergy Anaphylaxis and Hives    Hives all over the body.  . Percocet [Oxycodone-Acetaminophen] Hives  . Iodinated Diagnostic Agents     PT IS NOT AWARE OF IODINE ALLERGY, PREMEDICATED FOR PRIOR PREMEDS ONLY//A.C.  . Iodine Hives   Follow-up Information    Follow up with Gwendolyn Grant, MD. Schedule an appointment as soon as possible for a visit in 1 week.   Specialty:  Internal Medicine   Why:  Hospital followup   Contact information:   [address] [place] [address] [phone]        The results of significant diagnostics from this hospitalization (including imaging, microbiology, ancillary and laboratory) are listed below for reference.    Significant Diagnostic Studies: Dg Tibia/fibula Right  03/18/2014   CLINICAL DATA:  81 year old female with history of right lower extremity wound since 02/21/2014, which now appears infected.  EXAM: RIGHT TIBIA AND FIBULA - 2 VIEW  COMPARISON:  No priors.  FINDINGS: Three views of the right tibia and fibula demonstrate no destructive osseous lesion. No acute displaced fractures. Numerous vascular and soft tissue calcifications.  IMPRESSION: 1. No acute radiographic abnormality of the right tibia or fibula.   Electronically Signed   By: Vinnie Langton M.D.   On: 03/18/2014 17:31   Dg Foot 2 Views Right  03/22/2014   CLINICAL DATA:  Pain and swelling of right foot.  EXAM: RIGHT FOOT - 2 VIEW  COMPARISON:  None.  FINDINGS: No evidence of fracture or dislocation. Mild degenerative changes are seen involving interphalangeal joints of all of the toes. No bony lesions or destruction identified. Lateral view shows plantar and posterior calcaneal spurs. Soft tissues are grossly unremarkable without foreign body visualized.  IMPRESSION: No acute findings.  Mild degenerative changes of the toes.   Electronically Signed   By: Aletta Edouard M.D.   On: 03/22/2014 09:52    Microbiology: Recent Results (from the past 240 hour(s))  Culture, blood (single)     Status: None (Preliminary result)   Collection Time: 03/18/14  9:00 PM  Result Value Ref Range Status   Specimen Description BLOOD LEFT HAND  Final   Special Requests BOTTLES DRAWN AEROBIC ONLY [place]  Final   Culture   Final           BLOOD CULTURE RECEIVED NO GROWTH TO DATE CULTURE WILL BE HELD FOR 5 DAYS BEFORE ISSUING A FINAL NEGATIVE REPORT Performed at [facility]    Report Status PENDING  Incomplete  Wound culture      Status: None   Collection Time: 03/18/14 10:47 PM  Result Value Ref Range Status   Specimen Description WOUND RIGHT LEG  Final   Special Requests Normal  Final   Gram Stain   Final    RARE WBC PRESENT, PREDOMINANTLY PMN NO SQUAMOUS EPITHELIAL CELLS SEEN NO ORGANISMS SEEN Performed at [facility]    Culture   Final    MULTIPLE ORGANISMS PRESENT, NONE PREDOMINANT Note: NO STAPHYLOCOCCUS AUREUS ISOLATED NO GROUP A STREP (S.PYOGENES) ISOLATED Performed at [facility]    Report Status 03/21/2014 FINAL  Final  Clostridium Difficile by PCR     Status: None   Collection Time: 03/22/14  7:37 AM  Result Value Ref Range Status   C difficile by pcr NEGATIVE NEGATIVE Final     Labs: Basic Metabolic Panel:  Recent Labs Lab 03/18/14 1749 03/19/14 0525 03/21/14 0525 03/23/14 0543 03/24/14 0548  NA 135 137 140 141 143  K 3.6 3.8 3.9 3.8 3.8  CL 99 102 106 107 107  CO2 24 26 26 27 26   GLUCOSE 102* 111* 95 102* 100*  BUN 31* 32* 24* 23 23  CREATININE 1.34* 1.33* 1.11* 1.08 0.99  CALCIUM 9.7 9.2 9.1 9.1 9.3  MG  --  1.9  --   --   --   PHOS  --  4.0  --   --   --    Liver Function Tests:  Recent Labs Lab 03/19/14 0525  AST 16  ALT 13  ALKPHOS 74  BILITOT 1.2  PROT 6.6  ALBUMIN 3.4*   No results for input(s): LIPASE, AMYLASE in the last 168 hours. No results for input(s): AMMONIA in the last 168 hours. CBC:  Recent Labs Lab 03/18/14 1749 03/19/14 0525 03/21/14 0525 03/23/14 0543  WBC 11.7* 9.5 8.9 8.1  NEUTROABS 7.9*  --   --   --   HGB 12.6 12.0 11.1* 11.0*  HCT 38.8 36.3 34.9* 34.2*  MCV 88.2 88.1 89.9 89.3  PLT 318 268 303 318   Cardiac Enzymes: No results for input(s): CKTOTAL, CKMB, CKMBINDEX, TROPONINI in the last 168 hours. BNP: BNP (last 3 results)  Recent Labs  03/18/14 2100  BNP 137.5*    ProBNP (last 3 results) No results for input(s): PROBNP in the last 8760 hours.  CBG: No results for input(s): GLUCAP in the last 168  hours.     SignedCristal Ford  [facility] 03/24/2014, 9:49 AM

## 2014-03-25 LAB — CULTURE, BLOOD (SINGLE): CULTURE: NO GROWTH

## 2014-03-26 ENCOUNTER — Other Ambulatory Visit (HOSPITAL_BASED_OUTPATIENT_CLINIC_OR_DEPARTMENT_OTHER): Payer: PPO

## 2014-03-26 ENCOUNTER — Ambulatory Visit (INDEPENDENT_AMBULATORY_CARE_PROVIDER_SITE_OTHER): Payer: PPO | Admitting: Family Medicine

## 2014-03-26 ENCOUNTER — Encounter: Payer: Self-pay | Admitting: Family Medicine

## 2014-03-26 VITALS — BP 146/64 | HR 68 | Temp 98.9°F

## 2014-03-26 DIAGNOSIS — C50919 Malignant neoplasm of unspecified site of unspecified female breast: Secondary | ICD-10-CM

## 2014-03-26 DIAGNOSIS — R609 Edema, unspecified: Secondary | ICD-10-CM

## 2014-03-26 DIAGNOSIS — M8580 Other specified disorders of bone density and structure, unspecified site: Secondary | ICD-10-CM

## 2014-03-26 DIAGNOSIS — C50211 Malignant neoplasm of upper-inner quadrant of right female breast: Secondary | ICD-10-CM

## 2014-03-26 DIAGNOSIS — L03115 Cellulitis of right lower limb: Secondary | ICD-10-CM

## 2014-03-26 DIAGNOSIS — I872 Venous insufficiency (chronic) (peripheral): Secondary | ICD-10-CM

## 2014-03-26 LAB — CBC WITH DIFFERENTIAL/PLATELET
BASO%: 0.4 % (ref 0.0–2.0)
Basophils Absolute: 0 10*3/uL (ref 0.0–0.1)
EOS ABS: 0.2 10*3/uL (ref 0.0–0.5)
EOS%: 2.4 % (ref 0.0–7.0)
HCT: 33.9 % — ABNORMAL LOW (ref 34.8–46.6)
HGB: 11.2 g/dL — ABNORMAL LOW (ref 11.6–15.9)
LYMPH%: 19.7 % (ref 14.0–49.7)
MCH: 29 pg (ref 25.1–34.0)
MCHC: 33 g/dL (ref 31.5–36.0)
MCV: 87.8 fL (ref 79.5–101.0)
MONO#: 0.9 10*3/uL (ref 0.1–0.9)
MONO%: 8.6 % (ref 0.0–14.0)
NEUT%: 68.9 % (ref 38.4–76.8)
NEUTROS ABS: 6.8 10*3/uL — AB (ref 1.5–6.5)
PLATELETS: 333 10*3/uL (ref 145–400)
RBC: 3.86 10*6/uL (ref 3.70–5.45)
RDW: 13.2 % (ref 11.2–14.5)
WBC: 9.9 10*3/uL (ref 3.9–10.3)
lymph#: 2 10*3/uL (ref 0.9–3.3)

## 2014-03-26 LAB — COMPREHENSIVE METABOLIC PANEL (CC13)
ALBUMIN: 3.2 g/dL — AB (ref 3.5–5.0)
ALT: 41 U/L (ref 0–55)
ANION GAP: 10 meq/L (ref 3–11)
AST: 52 U/L — ABNORMAL HIGH (ref 5–34)
Alkaline Phosphatase: 71 U/L (ref 40–150)
BUN: 20.3 mg/dL (ref 7.0–26.0)
CHLORIDE: 108 meq/L (ref 98–109)
CO2: 25 meq/L (ref 22–29)
Calcium: 9.9 mg/dL (ref 8.4–10.4)
Creatinine: 1 mg/dL (ref 0.6–1.1)
EGFR: 53 mL/min/{1.73_m2} — ABNORMAL LOW (ref >90)
Glucose: 100 mg/dl (ref 70–140)
Potassium: 4.1 mEq/L (ref 3.5–5.1)
Sodium: 144 mEq/L (ref 136–145)
Total Bilirubin: 0.51 mg/dL (ref 0.20–1.20)
Total Protein: 6.7 g/dL (ref 6.4–8.3)

## 2014-03-26 MED ORDER — DOXYCYCLINE HYCLATE 100 MG PO CAPS: 100.0000 mg | ORAL_CAPSULE | Freq: Two times a day (BID) | ORAL | Status: DC

## 2014-03-26 NOTE — Progress Notes (Signed)
Pre visit review using our clinic review tool, if applicable. No additional management support is needed unless otherwise documented below in the visit note. 

## 2014-03-26 NOTE — Progress Notes (Signed)
   Subjective:    Patient ID: Rachael [name], female    DOB: 11-16-1933, 81 y.o.   MRN: [id number]  HPI Here to follow up a hospital stay from 03-18-14 to 03-24-14 for cellulitis in the right lower leg. After a laceration the wound became quite infected and she developed a fever. In the hospital she received IV antibiotics which were then switched over to oral Doxycycline prior to her DC home. Wound cultures revealed no MRSA but no specific identification was made. Blood cultures remained negative. She feels much better now although the leg still has some mild swelling a discomfort. She is dressing it every day with Silvadene. During this time she briefly developed renal insufficiency and her usual HCTZ-triamterene was stopped. Since then she has had some leg swelling and her BP has been mildly elevated. Her renal function returned to normal prior to her DC. She asks if she can safely fly to [facility] this weekend for a conference she has been looking forward to. No chest pain or SOB.    Review of Systems  Constitutional: Negative.   Respiratory: Negative.   Cardiovascular: Positive for leg swelling. Negative for chest pain and palpitations.       Objective:   Physical Exam  Constitutional: She appears well-developed and well-nourished.  Cardiovascular: Normal rate, regular rhythm, normal heart sounds and intact distal pulses.   Pulmonary/Chest: Effort normal and breath sounds normal.  Musculoskeletal:  The right lower leg looks much improved, the wound is smaller and looks clean. There is no erythema or warmth or tenderness. Both feet have 1+ edema.           Assessment & Plan:  The cellulitis is improving nicely. She was to have finished the Doxycycline in 3 day, but I will have her take this an additional 7 days after that. She will continue to dress it as above. She is cleared to fly this weekend but she is to keep the legs elevated as much as poddible. She will get back on the  HCTZ-triamterene during the trip and we will re-evaluate this after she gets back home next week.

## 2014-03-28 ENCOUNTER — Other Ambulatory Visit: Payer: Self-pay | Admitting: Internal Medicine

## 2014-03-28 MED ORDER — DOXYCYCLINE MONOHYDRATE 100 MG PO CAPS: 100.0000 mg | ORAL_CAPSULE | Freq: Two times a day (BID) | ORAL | Status: DC

## 2014-03-28 NOTE — Telephone Encounter (Signed)
Done. Message left to advise pt

## 2014-03-28 NOTE — Telephone Encounter (Signed)
Please make this change

## 2014-03-28 NOTE — Telephone Encounter (Signed)
Pt states the doxycycline (VIBRAMYCIN) 100 MG capsule is 4 x more than the MONODOX)  She would like to stay on doxycycline (MONODOX) 100 MG capsule due to the price. [facility]  Pt would like a cb when done

## 2014-04-02 ENCOUNTER — Ambulatory Visit (HOSPITAL_BASED_OUTPATIENT_CLINIC_OR_DEPARTMENT_OTHER): Payer: PPO | Admitting: Oncology

## 2014-04-02 ENCOUNTER — Ambulatory Visit (INDEPENDENT_AMBULATORY_CARE_PROVIDER_SITE_OTHER): Payer: PPO | Admitting: Family Medicine

## 2014-04-02 ENCOUNTER — Telehealth: Payer: Self-pay | Admitting: Oncology

## 2014-04-02 ENCOUNTER — Encounter: Payer: Self-pay | Admitting: Family Medicine

## 2014-04-02 VITALS — BP 140/51 | HR 67 | Temp 98.8°F | Resp 18 | Ht 68.0 in | Wt 232.7 lb

## 2014-04-02 VITALS — BP 140/69 | HR 72 | Temp 99.2°F

## 2014-04-02 DIAGNOSIS — C50211 Malignant neoplasm of upper-inner quadrant of right female breast: Secondary | ICD-10-CM | POA: Diagnosis not present

## 2014-04-02 DIAGNOSIS — N183 Chronic kidney disease, stage 3 unspecified: Secondary | ICD-10-CM

## 2014-04-02 DIAGNOSIS — N1831 Chronic kidney disease, stage 3a: Secondary | ICD-10-CM | POA: Insufficient documentation

## 2014-04-02 DIAGNOSIS — Z170 Estrogen receptor positive status [ER+]: Secondary | ICD-10-CM

## 2014-04-02 DIAGNOSIS — D630 Anemia in neoplastic disease: Secondary | ICD-10-CM

## 2014-04-02 DIAGNOSIS — M8580 Other specified disorders of bone density and structure, unspecified site: Secondary | ICD-10-CM | POA: Insufficient documentation

## 2014-04-02 DIAGNOSIS — B370 Candidal stomatitis: Secondary | ICD-10-CM | POA: Diagnosis not present

## 2014-04-02 DIAGNOSIS — C50911 Malignant neoplasm of unspecified site of right female breast: Secondary | ICD-10-CM

## 2014-04-02 DIAGNOSIS — I872 Venous insufficiency (chronic) (peripheral): Secondary | ICD-10-CM | POA: Diagnosis not present

## 2014-04-02 DIAGNOSIS — L03115 Cellulitis of right lower limb: Secondary | ICD-10-CM | POA: Diagnosis not present

## 2014-04-02 MED ORDER — NYSTATIN 100000 UNIT/ML MT SUSP: 5.0000 mL | Freq: Four times a day (QID) | OROMUCOSAL | Status: DC

## 2014-04-02 MED ORDER — TRIAMTERENE-HCTZ 37.5-25 MG PO CAPS
ORAL_CAPSULE | ORAL | Status: DC
Start: 2014-04-02 — End: 2014-04-02

## 2014-04-02 MED ORDER — FLUCONAZOLE 150 MG PO TABS: 150.0000 mg | ORAL_TABLET | Freq: Once | ORAL | Status: DC

## 2014-04-02 NOTE — Progress Notes (Signed)
   Subjective:    Patient ID: Rachael [name], female    DOB: 1933-11-02, 81 y.o.   MRN: [id number]  HPI Here to follow up on her leg cellulitis, on HTN, and on leg swelling. The leg wound is slowly but steadily improving and she has little pain there now. She is wrapping it daily. She enjoyed her trip to [place] and was able to participate in all the activities. She has been taking Doxycycline and has a few days left, but this has caused a vaginal yeast infection. She is using Monistat. She also has had a soreness in the mouth and on the tongue. She has been taking Triamterene HCTZ but she asks if she can stop this. As long as she wraps both lower legs she has little trouble with swelling.    Review of Systems  Constitutional: Negative.   Respiratory: Negative.   Cardiovascular: Positive for leg swelling. Negative for chest pain and palpitations.  Genitourinary: Positive for vaginal discharge.       Objective:   Physical Exam  Constitutional: She appears well-developed and well-nourished.  HENT:  The mouth and tongue are a little red with some white exudate here and there   Neck: Neck supple. No thyromegaly present.  Musculoskeletal:  2+ edema to both lower legs. The wound on the right lower leg is healing in and is closing in  Lymphadenopathy:    She has no cervical adenopathy.          Assessment & Plan:  The wound is healing. She will dress it until the skin closes over and then she will go back to wearing compression stockings to keep the edema down. Stop the Triamterene HCTZ once and for all. Use Diflucan and Nystatin for the yeast infections.

## 2014-04-02 NOTE — Progress Notes (Signed)
Pre visit review using our clinic review tool, if applicable. No additional management support is needed unless otherwise documented below in the visit note. 

## 2014-04-02 NOTE — Telephone Encounter (Signed)
Per 04/02/14 pof patient to have lab/HF sept 2016 and lab/GM sept 2017. Patient given avs report and appts for sept 2016. Patient aware she will be given appointment for sept 2017 at [place] 2016 visit.

## 2014-04-02 NOTE — Progress Notes (Signed)
ID: Rachael [name]   DOB: September 12, 1933  MR#: [id number]  GYI#:[id number]  PCP: [name], MD GYN: Dian Queen, MD SU: Autumn Messing, MD OTHER MD:  Minus Breeding, MD;  Nena Polio, MD; Thea Silversmith, MD;  Lucio Edward, MD  CHIEF COMPLAINT:  Right Breast Cancer  CURRENT TREATMENT: Anastrozole   HISTORY OF PRESENT ILLNESS: From the original intake note:  Rachael [name] had routine screening mammography at [facility] 05/06/2011, showing an area of focal asymmetry in the lower inner quadrant of the right breast. Additional views 05/10/2011 confirmed a 9 mm area of irregularity without associated microcalcifications. Ultrasound of the right breast confirmed a 9 mm hypoechoic mass with posterior acoustic shadowing. Biopsy of this mass was performed the next day, and showed ([id number]) and invasive lobular carcinoma, E-cadherin negative, which was estrogen receptor positive at 97%, and progesterone receptor positive at 81%. There was no HER-2 amplification, and MIB-1 was 12%.  On may 07/31/2011 him of the patient underwent breast MRI, which showed a 1.5 cm mildly irregular enhancing mass in the upper inner quadrant of the right breast. There were no other masses of concern and no abnormal appearing lymph nodes. Accordingly, on 06/16/2011 the patient underwent right lumpectomy and sentinel lymph node sampling under Dr. Star Age, the final pathology (SZ [id number]) showing an invasive lobular carcinoma, grade 1, measuring 1.5 cm, with both sentinel lymph nodes negative.   The patient's subsequent history is as detailed below.  INTERVAL HISTORY: Rachael [name] returns today for followup of her breast cancer. The interval history is significant for her having had to be admitted for significant right lower extremity infection. For a while it appears she might lose her leg. She did recover and is still on antibiotics, but is not entirely out of the woods yet, she tells me. She is using a walker today.--She continues on  anastrozole. She is tolerating this well, with no significant hot flashes or vaginal dryness issues. She obtains it currently at $12 for 3 month supply  REVIEW OF SYSTEMS: [name] has lost about 12 pounds, she tells me, a lot of which was fluid, but some of it due to "my stomach being torn up" from the antibiotics. She denies unusual headaches, visual changes, nausea, or vomiting. She has had no cough phlegm production or pleurisy. She tells me the second toe in her left foot is still a little red and swollen, but the whole leg and toe are much better. She had a terrible yeast infection in her groin secondary to the antibiotics but that also has resolved. A detailed review of systems today was otherwise stable.  PAST MEDICAL HISTORY: Past Medical History  Diagnosis Date  . Chronic venous insufficiency   . PVD (peripheral vascular disease)     mild carotid 11/05  . Osteoarthritis     bilat hips, severe  . Obesity   . CONTACT DERMATITIS   . GLUCOSE INTOLERANCE   . VITAMIN D DEFICIENCY   . Edema     pt  states had edema of feet and legs has been on lasix per dr. to help  . CAROTID STENOSIS   . DUPUYTREN'S CONTRACTURE   . MENOPAUSAL SYNDROME   . HLD (hyperlipidemia)   . Hypothyroidism     Dr. Ala Dach in [place]   . Breast cancer     R    PAST SURGICAL HISTORY: Past Surgical History  Procedure Laterality Date  . 2d echo  11/29/03    normal LV size. normal EF   .  Normal caronary arteries      per pt. 2003  . Cholecystectomy    . Inguinal hernia repair    . Tonsillectomy    . Right rotator cuff surgery    . Total hip arthroplasty  05/2008 and 10/2008    B hip - alusio  . Hernia repair      incisional hernia  . Breast biopsy  05/11/11    right breast  . Breast lumpectomy      right breast  . Back surgery  1983    lumbarectomy  . Finger surgery    . Joint replacement Bilateral     Dr. Maureen Ralphs    FAMILY HISTORY Family History  Problem Relation Age of Onset  .  Coronary artery disease      family hx - female 1st degree relative <60  . Hypothyroidism      aunt   . Uterine cancer Mother 45  . Breast cancer Daughter 68    BRCA negative (no BART)  . Pancreatic cancer Maternal Grandfather     diagnosed in his 31s  . Anesthesia problems Neg Hx   . Prostate cancer Maternal Uncle     diagnosed in his 86s  . Colon cancer Cousin   . Prostate cancer Paternal Uncle     diagnosed in his 32s  . Clotting disorder Maternal Grandmother   . Uterine cancer Cousin   . Leukemia Cousin   . Prostate cancer Cousin    the patient's father died at the age of 13 from heart disease. The patient's mother died in 6 at the age of 56. She had uterine cancer in her 72s. There is a significant cancer family on her side, to be detailed when the patient meets with her genetics counselor. The patient herself has no sisters, does have 3 brothers. One of the patient's daughters, Rachael [name], was diagnosed with breast cancer at the age of 73. She was tested for the BRCA gene and was negative. There is no other breast or ovarian cancer in the immediate family to her knowledge.  GYNECOLOGIC HISTORY: Menarche age 64, menopause age 50. The patient tried Prempro briefly but did not like it. She is GX P5, first live birth age 62.  SOCIAL HISTORY:   (Updated December 2014) Rachael [name] worked in [employer], currently is developing a [employer]. She is divorced and lives by herself. 3 of her 5 children live in town (Rachael [name], Rachael [name], and Rachael [name]; Rachael [name] is also followed here w a history of breast cancer) and 2 live in [place] and [place]). She has 7 grandchildren. She attends the [employer].   ADVANCED DIRECTIVES: Not in place  HEALTH MAINTENANCE:  (Updated December 2014) History  Substance Use Topics  . Smoking status: Never Smoker   . Smokeless tobacco: Never Used  . Alcohol Use: No     Colonoscopy: 2009/[facility]  PAP: Sept 2014/[facility]  Bone density: [facility] 09/07/2011, Normal  Lipid panel: Sept 2014/ [facility]    Allergies  Allergen Reactions  . Shellfish Allergy Anaphylaxis and Hives    Hives all over the body.  . Percocet [Oxycodone-Acetaminophen] Hives  . Iodinated Diagnostic Agents     PT IS NOT AWARE OF IODINE ALLERGY, PREMEDICATED FOR PRIOR PREMEDS ONLY//A.C.  . Iodine Hives    Current Outpatient Prescriptions  Medication Sig Dispense Refill  . anastrozole (ARIMIDEX) 1 MG tablet Take 1 tablet (1 mg total) by mouth daily. 90 tablet 3  . cholecalciferol (VITAMIN  D) 1000 UNITS tablet Take 2,000 Units by mouth daily.    . Cyanocobalamin (B-12 PO) Take 1 tablet by mouth daily.     Marland Kitchen docusate sodium (COLACE) 100 MG capsule Take 1 capsule (100 mg total) by mouth 2 (two) times daily. (Patient not taking: Reported on 03/26/2014) 10 capsule 0  . doxycycline (MONODOX) 100 MG capsule Take 1 capsule (100 mg total) by mouth 2 (two) times daily. 14 capsule 0  . indomethacin (INDOCIN) 25 MG capsule Take 1 capsule (25 mg total) by mouth 2 (two) times daily with a meal. 6 capsule 0  . levothyroxine (SYNTHROID, LEVOTHROID) 137 MCG tablet Take 137 mcg by mouth daily.     Marland Kitchen loperamide (IMODIUM) 2 MG capsule Take 1 capsule (2 mg total) by mouth as needed for diarrhea or loose stools. 30 capsule 0  . Nutritional Supplements (JUICE PLUS FIBRE) LIQD Take 4 capsules by mouth 3 (three) times daily. 4 vegetable capsules with breakfast, 4 fruit capsules with lunch and 4 berry capsules with dinner.    . saccharomyces boulardii (FLORASTOR) 250 MG capsule Take 1 capsule (250 mg total) by mouth 2 (two) times daily. 30 capsule 0  . Triamcinolone Acetonide (KENALOG EX) Apply 1 application topically 2 (two) times daily as needed (legs).    . triamterene-hydrochlorothiazide (DYAZIDE) 37.5-25 MG per capsule Take 1 each (1 capsule total) by mouth every morning. (Patient not taking: Reported on 03/26/2014) 30 capsule 0   No current facility-administered medications for  this visit.    OBJECTIVE: Middle-aged white woman who appears stated age 39 Vitals:   04/02/14 1329  BP: 140/51  Pulse: 67  Temp: 98.8 F (37.1 C)  Resp: 18     Body mass index is 35.39 kg/(m^2).    ECOG FS: 2 Filed Weights   04/02/14 1329  Weight: 232 lb 11.2 oz (105.552 kg)   Sclerae unicteric, EOMs intact Oropharynx clear and moist No cervical or supraclavicular adenopathy Lungs no rales or rhonchi Heart regular rate and rhythm Abd soft, nontender, positive bowel sounds MSK no focal spinal tenderness, minimal swelling slight erythema second left toe Neuro: nonfocal, well oriented, appropriate affect Breasts: The right breast is status post lumpectomy. There is no evidence of local recurrence. The right axilla is benign. The left breast is unremarkable  LAB RESULTS: Lab Results  Component Value Date   WBC 9.9 03/26/2014   NEUTROABS 6.8* 03/26/2014   HGB 11.2* 03/26/2014   HCT 33.9* 03/26/2014   MCV 87.8 03/26/2014   PLT 333 03/26/2014      Chemistry      Component Value Date/Time   NA 144 03/26/2014 1242   NA 143 03/24/2014 0548   K 4.1 03/26/2014 1242   K 3.8 03/24/2014 0548   CL 107 03/24/2014 0548   CL 104 06/20/2012 1421   CO2 25 03/26/2014 1242   CO2 26 03/24/2014 0548   BUN 20.3 03/26/2014 1242   BUN 23 03/24/2014 0548   CREATININE 1.0 03/26/2014 1242   CREATININE 0.99 03/24/2014 0548      Component Value Date/Time   CALCIUM 9.9 03/26/2014 1242   CALCIUM 9.3 03/24/2014 0548   ALKPHOS 71 03/26/2014 1242   ALKPHOS 74 03/19/2014 0525   AST 52* 03/26/2014 1242   AST 16 03/19/2014 0525   ALT 41 03/26/2014 1242   ALT 13 03/19/2014 0525   BILITOT 0.51 03/26/2014 1242   BILITOT 1.2 03/19/2014 0525       STUDIES:  CLINICAL DATA: Postmenopausal.  EXAM:  DUAL X-RAY ABSORPTIOMETRY (DXA) FOR BONE MINERAL DENSITY  FINDINGS: AP LUMBAR SPINE (L1-L2, L4)  Bone Mineral Density (BMD): 1.115 g/cm2  Young Adult T-Score: 0.7  Z-Score:  3.3  LEFT FOREARM (1/3 RADIUS)  Bone Mineral Density (BMD): 0.786  Young Adult T Score: 1.5  Z Score: 4.4  ASSESSMENT: Patient's diagnostic category is NORMAL by WHO Criteria.  FRACTURE RISK: NOT INCREASED  FRAX: World Health Organization FRAX assessment of absolute fracture risk is not calculated for this patient because the patient has a normal study.  COMPARISON: The bone mineral density of the lumbar spine has increased by 5.9% since the baseline study dated 02/18/2000. The bone mineral density of the lumbar spine has decreased by 2.7% and there has been no significant interval change of the left forearm compared to the prior study dated 09/07/2011.   ASSESSMENT: 81 y.o. [place] woman   (1)  s/p Right lumpectomy and sentinel lymph node sampling 06/16/2011 for a T1c N0, stage IA invasive lobular breast cancer, grade 1, strongly estrogen and progesterone positive, with no HER-2 amplification, and an Mib-1 of 12%  (2) the patient met with radiation oncology 07/14/2011, but opted against adjuvant radiation  (3) started anastrozole July 2013, briefly discontinued because of side effects but resumed October 2013.  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 other genes tested were normal and included APC, ATM, BARD1, BMPR1A, BRCA1, BRCA2, BRIP1, CDH1, CHEK2, EPCAM, MLH1, MRE11A, MSH6, MUTYH, NBN, PALB2, PMS2, PTEN, RAD50, RAD51C, SMAD4, STK11 and TP53  PLAN:  Charnise is tolerating the anastrozole well and it is reassuring that her bone density remains in the normal range. The plan is to continue anastrozole for a total of 5 years which is going to take [place] to September 2018.  She will see [place] this September and then yearly until she completes her 5 years of antiestrogen.  She is due for repeat mammography in June. She will make sure we get a copy of those results.  What she recovers from the current problem I have  encouraged her to return to her previously excellent exercise program.  Naasia has a good understanding of the overall plan. She agrees with it. She knows the goal of treatment in her case is cure. She will call with any problems that may develop before her next visit here. Chauncey Cruel, MD  04/02/2014

## 2014-04-08 ENCOUNTER — Ambulatory Visit (INDEPENDENT_AMBULATORY_CARE_PROVIDER_SITE_OTHER): Payer: PPO

## 2014-04-08 ENCOUNTER — Encounter: Payer: Self-pay | Admitting: Podiatry

## 2014-04-08 ENCOUNTER — Ambulatory Visit (INDEPENDENT_AMBULATORY_CARE_PROVIDER_SITE_OTHER): Payer: PPO | Admitting: Podiatry

## 2014-04-08 VITALS — BP 119/54 | HR 60 | Resp 15

## 2014-04-08 DIAGNOSIS — M79671 Pain in right foot: Secondary | ICD-10-CM | POA: Diagnosis not present

## 2014-04-08 DIAGNOSIS — M779 Enthesopathy, unspecified: Secondary | ICD-10-CM

## 2014-04-08 MED ORDER — TRIAMCINOLONE ACETONIDE 10 MG/ML IJ SUSP
10.0000 mg | Freq: Once | INTRAMUSCULAR | Status: AC
Start: 2014-04-08 — End: 2014-04-08
  Administered 2014-04-08: 10 mg

## 2014-04-08 NOTE — Progress Notes (Signed)
Subjective:    Patient ID: Rachael [name], female    DOB: 12/12/1933, 81 y.o.   MRN: [id number]  HPI Pt presents with right foot pain, foot is red and swollen, 2nd met red swollen and painful   Review of Systems  Constitutional: Positive for chills and appetite change.  Cardiovascular: Positive for leg swelling.  Musculoskeletal: Positive for gait problem.  Allergic/Immunologic: Positive for food allergies.  All other systems reviewed and are negative.      Objective:   Physical Exam        Assessment & Plan:

## 2014-04-09 NOTE — Progress Notes (Signed)
Subjective:     Patient ID: Rachael [name], female   DOB: July 12, 1933, 81 y.o.   MRN: [id number]  HPI patient presents with pain and swelling in the second toe right foot and a history of having an infection in her right leg which required hospitalization and IV antibiotics. She also has some swelling in her ankle which has occurred due to the nonweightbearing. That she had to go through   Review of Systems  All other systems reviewed and are negative.      Objective:   Physical Exam  Constitutional: She is oriented to person, place, and time.  Cardiovascular: Intact distal pulses.   Musculoskeletal: Normal range of motion.  Neurological: She is oriented to person, place, and time.  Skin: Skin is warm.  Nursing note and vitals reviewed.  neurovascular status intact with muscle strength adequate and range of motion subtalar midtarsal joint within normal limits. Patient does have a dressing on the right lower leg probably due to the previous injury and she states it is healing and hopefully will come off the next 2 weeks. Swelling in the ankle was noted and there is quite a bit of swelling in the second toe right with no drainage or no indication of open-like incision or indications of infection     Assessment:     Probable inflammatory changes second toe right secondary to her nonweightbearing status or possible trauma    Plan:     Explained condition and reviewed x-rays. At this time I did a careful proximal block of the area and then injected a small amount of dexamethasone Kenalog around the interphalangeal joint digit 2 right to reduce swelling. Discussed possible Unna boot once her leg has healed to try to reduce the swelling in her ankle but we will hold off until she is completely healed. Reappoint earlier if any issues should occur

## 2014-04-22 ENCOUNTER — Ambulatory Visit: Payer: PPO | Admitting: Podiatry

## 2014-04-24 ENCOUNTER — Encounter: Payer: PPO | Admitting: Vascular Surgery

## 2014-04-24 ENCOUNTER — Encounter (HOSPITAL_COMMUNITY): Payer: PPO

## 2014-04-25 ENCOUNTER — Ambulatory Visit: Payer: PPO | Admitting: Podiatry

## 2014-05-01 ENCOUNTER — Ambulatory Visit (INDEPENDENT_AMBULATORY_CARE_PROVIDER_SITE_OTHER): Payer: PPO | Admitting: Podiatry

## 2014-05-01 ENCOUNTER — Ambulatory Visit (INDEPENDENT_AMBULATORY_CARE_PROVIDER_SITE_OTHER): Payer: PPO

## 2014-05-01 DIAGNOSIS — M779 Enthesopathy, unspecified: Secondary | ICD-10-CM | POA: Diagnosis not present

## 2014-05-01 DIAGNOSIS — M79674 Pain in right toe(s): Secondary | ICD-10-CM

## 2014-05-01 DIAGNOSIS — M79671 Pain in right foot: Secondary | ICD-10-CM

## 2014-05-01 DIAGNOSIS — M1000 Idiopathic gout, unspecified site: Secondary | ICD-10-CM

## 2014-05-01 LAB — CBC WITH DIFFERENTIAL/PLATELET
Basophils Absolute: 0 10*3/uL (ref 0.0–0.1)
Basophils Relative: 0 % (ref 0–1)
EOS PCT: 3 % (ref 0–5)
Eosinophils Absolute: 0.3 10*3/uL (ref 0.0–0.7)
HCT: 38.4 % (ref 36.0–46.0)
Hemoglobin: 12.7 g/dL (ref 12.0–15.0)
LYMPHS PCT: 24 % (ref 12–46)
Lymphs Abs: 2.4 10*3/uL (ref 0.7–4.0)
MCH: 28.2 pg (ref 26.0–34.0)
MCHC: 33.1 g/dL (ref 30.0–36.0)
MCV: 85.1 fL (ref 78.0–100.0)
MPV: 8 fL — AB (ref 8.6–12.4)
Monocytes Absolute: 1.1 10*3/uL — ABNORMAL HIGH (ref 0.1–1.0)
Monocytes Relative: 11 % (ref 3–12)
Neutro Abs: 6.1 10*3/uL (ref 1.7–7.7)
Neutrophils Relative %: 62 % (ref 43–77)
PLATELETS: 314 10*3/uL (ref 150–400)
RBC: 4.51 MIL/uL (ref 3.87–5.11)
RDW: 14.5 % (ref 11.5–15.5)
WBC: 9.8 10*3/uL (ref 4.0–10.5)

## 2014-05-01 NOTE — Progress Notes (Signed)
Subjective:     Patient ID: Rachael [name], female   DOB: 05/30/1933, 81 y.o.   MRN: [id number]  HPI patient presents stating my second toe started to swell again and I wanted you to check this. States he got better for about 2 weeks and then started up again on Sunday   Review of Systems     Objective:   Physical Exam Neurovascular status intact no other changes in health history with edematous right second toe proximal phalanx to the metatarsal head with no proximal edema erythema and no drainage noted anywhere    Assessment:     Still appears to be inflammatory condition versus infection process    Plan:     We x-rayed and reviewed case with Dr. Jacqualyn Posey. We're sending her for blood work CBC and arthritic profile we will see the results and decide what else may be appropriate in treating her

## 2014-05-02 LAB — ARTHRITIS PANEL
ANA: NEGATIVE
Rhuematoid fact SerPl-aCnc: <10 IU/mL (ref <=14)
SED RATE: 13 mm/h (ref 0–30)
URIC ACID, SERUM: 6.4 mg/dL (ref 2.4–7.0)

## 2014-05-08 ENCOUNTER — Encounter: Payer: Self-pay | Admitting: Podiatry

## 2014-05-08 ENCOUNTER — Ambulatory Visit: Payer: PPO | Admitting: Podiatry

## 2014-05-08 ENCOUNTER — Ambulatory Visit (INDEPENDENT_AMBULATORY_CARE_PROVIDER_SITE_OTHER): Payer: PPO | Admitting: Podiatry

## 2014-05-08 VITALS — BP 144/70 | HR 60 | Resp 15

## 2014-05-08 DIAGNOSIS — M79674 Pain in right toe(s): Secondary | ICD-10-CM

## 2014-05-08 DIAGNOSIS — M779 Enthesopathy, unspecified: Secondary | ICD-10-CM

## 2014-05-08 MED ORDER — TRIAMCINOLONE ACETONIDE 10 MG/ML IJ SUSP
10.0000 mg | Freq: Once | INTRAMUSCULAR | Status: AC
  Administered 2014-05-08: 10 mg

## 2014-05-09 NOTE — Progress Notes (Signed)
Subjective:     Patient ID: Rachael [name], female   DOB: Dec 13, 1933, 81 y.o.   MRN: [id number]  HPI patient states my second toe right is not as swollen but it still is painful when pressed and I wanted to review my blood work   Review of Systems     Objective:   Physical Exam Neurovascular status with probable venous disease causing engorgement that she is scheduled to see a vein doctor in the next 4 weeks. The second toe right remains persistently discomfort in the proximal phalanx but it has improved quite a bit as far as the color change. Reviewed blood work indicating no current inflammatory disease or infection process    Assessment:     Probable inflammatory complex second digit right which is localized with possible venous engorgement secondary to venous disease    Plan:     Reviewed condition and at this time she wants anything to give her relief until her visit with the vein doctor and I recommended elevation stockings that she has at home for compression and I did do a proximal block and injected with a quarter cc of Dexon some Kenalog to try to reduce the inflammation until she can see the doctors. If any symptoms should recur or persist and should occur she is to reappoint to [place] but hopefully this will reduce the symptoms and she will utilize elevation and compression to keep the swelling from returning

## 2014-05-10 ENCOUNTER — Ambulatory Visit: Payer: PPO | Admitting: Podiatry

## 2014-05-20 ENCOUNTER — Encounter (HOSPITAL_COMMUNITY): Payer: Self-pay

## 2014-05-20 ENCOUNTER — Telehealth: Payer: Self-pay | Admitting: Family Medicine

## 2014-05-20 ENCOUNTER — Emergency Department (HOSPITAL_COMMUNITY)
Admission: EM | Admit: 2014-05-20 | Discharge: 2014-05-20 | Disposition: A | Discharge status: Home / Self Care | Payer: PPO | Attending: Emergency Medicine | Admitting: Emergency Medicine

## 2014-05-20 DIAGNOSIS — Z853 Personal history of malignant neoplasm of breast: Secondary | ICD-10-CM | POA: Diagnosis not present

## 2014-05-20 DIAGNOSIS — E039 Hypothyroidism, unspecified: Secondary | ICD-10-CM | POA: Insufficient documentation

## 2014-05-20 DIAGNOSIS — Z8742 Personal history of other diseases of the female genital tract: Secondary | ICD-10-CM | POA: Diagnosis not present

## 2014-05-20 DIAGNOSIS — M79671 Pain in right foot: Secondary | ICD-10-CM | POA: Diagnosis not present

## 2014-05-20 DIAGNOSIS — M79674 Pain in right toe(s): Secondary | ICD-10-CM

## 2014-05-20 DIAGNOSIS — Z872 Personal history of diseases of the skin and subcutaneous tissue: Secondary | ICD-10-CM | POA: Insufficient documentation

## 2014-05-20 DIAGNOSIS — Z7952 Long term (current) use of systemic steroids: Secondary | ICD-10-CM | POA: Diagnosis not present

## 2014-05-20 DIAGNOSIS — E669 Obesity, unspecified: Secondary | ICD-10-CM | POA: Diagnosis not present

## 2014-05-20 DIAGNOSIS — Z8679 Personal history of other diseases of the circulatory system: Secondary | ICD-10-CM | POA: Insufficient documentation

## 2014-05-20 DIAGNOSIS — Z79899 Other long term (current) drug therapy: Secondary | ICD-10-CM | POA: Insufficient documentation

## 2014-05-20 DIAGNOSIS — M7989 Other specified soft tissue disorders: Secondary | ICD-10-CM

## 2014-05-20 LAB — CBC WITH DIFFERENTIAL/PLATELET
BASOS ABS: 0 10*3/uL (ref 0.0–0.1)
BASOS PCT: 0 % (ref 0–1)
EOS ABS: 0.1 10*3/uL (ref 0.0–0.7)
Eosinophils Relative: 1 % (ref 0–5)
HEMATOCRIT: 36.6 % (ref 36.0–46.0)
Hemoglobin: 12.1 g/dL (ref 12.0–15.0)
LYMPHS PCT: 16 % (ref 12–46)
Lymphs Abs: 1.7 10*3/uL (ref 0.7–4.0)
MCH: 28.4 pg (ref 26.0–34.0)
MCHC: 33.1 g/dL (ref 30.0–36.0)
MCV: 85.9 fL (ref 78.0–100.0)
MONO ABS: 1.6 10*3/uL — AB (ref 0.1–1.0)
Monocytes Relative: 15 % — ABNORMAL HIGH (ref 3–12)
Neutro Abs: 7.5 10*3/uL (ref 1.7–7.7)
Neutrophils Relative %: 68 % (ref 43–77)
Platelets: 299 10*3/uL (ref 150–400)
RBC: 4.26 MIL/uL (ref 3.87–5.11)
RDW: 14.2 % (ref 11.5–15.5)
WBC: 11 10*3/uL — AB (ref 4.0–10.5)

## 2014-05-20 LAB — BASIC METABOLIC PANEL
Anion gap: 8 (ref 5–15)
BUN: 19 mg/dL (ref 6–20)
CO2: 24 mmol/L (ref 22–32)
CREATININE: 0.84 mg/dL (ref 0.44–1.00)
Calcium: 9.3 mg/dL (ref 8.9–10.3)
Chloride: 103 mmol/L (ref 101–111)
GFR calc Af Amer: >60 mL/min (ref >60)
Glucose, Bld: 117 mg/dL — ABNORMAL HIGH (ref 70–99)
Potassium: 3.4 mmol/L — ABNORMAL LOW (ref 3.5–5.1)
Sodium: 135 mmol/L (ref 135–145)

## 2014-05-20 LAB — SEDIMENTATION RATE: SED RATE: 61 mm/h — AB (ref 0–22)

## 2014-05-20 MED ORDER — DEXAMETHASONE 4 MG PO TABS
12.0000 mg | ORAL_TABLET | Freq: Once | ORAL | Status: AC
  Administered 2014-05-20: 12 mg via ORAL
  Filled 2014-05-20: qty 3

## 2014-05-20 MED ORDER — TRAMADOL HCL 50 MG PO TABS: 50.0000 mg | ORAL_TABLET | Freq: Four times a day (QID) | ORAL | Status: DC | PRN

## 2014-05-20 NOTE — Telephone Encounter (Signed)
Make an OV with me so we can talk about this

## 2014-05-20 NOTE — Telephone Encounter (Signed)
Patient would like a referral for a Trauma Doctor at [facility].  Patient was in [facility] ED on 5/81/16 for her right foot.

## 2014-05-20 NOTE — ED Notes (Signed)
Pt presents with c/o right foot swelling. Pt was admitted to the hospital for an infection in her leg. Pt reports the swelling in that foot started several days after that. Pt was recently seen at [facility] and they were unable to diagnose the swelling and reported to her that it would take time to heal.

## 2014-05-20 NOTE — ED Provider Notes (Signed)
CSN: [id number]     Arrival date & time 05/20/14  0114 History   First MD Initiated Contact with Patient 05/20/14 0142     Chief Complaint  Patient presents with  . Foot Swelling     (Consider location/radiation/quality/duration/timing/severity/associated sxs/prior Treatment) The history is provided by the patient.   81 year old female comes in with ongoing problems with pain and swelling in her right second toe. She had been admitted to the hospital 2 months ago because of infection in the right leg and the foot and toes started swelling shortly after that. Her leg infection has responded well but she continues to have pain and swelling in the right second toe. She has been referred to a foot specialist to did an injection about 11 days ago and there was some slight improvement following that but it is now even worse. She states it is painful to walk and she is unable to put her shoes on. The foot specialist had run tests for gout and told her was not gout. She does have an appointment with vascular and vein specialists but it is not for about one month. She denies fever, chills, sweats. Also, of note, she had been on an antibiotic of triamterene and hydrochlorothiazide that had been discontinued.  Past Medical History  Diagnosis Date  . Chronic venous insufficiency   . PVD (peripheral vascular disease)     mild carotid 11/05  . Osteoarthritis     bilat hips, severe  . Obesity   . CONTACT DERMATITIS   . GLUCOSE INTOLERANCE   . VITAMIN D DEFICIENCY   . Edema     pt  states had edema of feet and legs has been on lasix per dr. to help  . CAROTID STENOSIS   . DUPUYTREN'S CONTRACTURE   . MENOPAUSAL SYNDROME   . HLD (hyperlipidemia)   . Hypothyroidism     Dr. Ala Dach in [place]   . Breast cancer     R   Past Surgical History  Procedure Laterality Date  . 2d echo  11/29/03    normal LV size. normal EF   . Normal caronary arteries      per pt. 2003  . Cholecystectomy    .  Inguinal hernia repair    . Tonsillectomy    . Right rotator cuff surgery    . Total hip arthroplasty  05/2008 and 10/2008    B hip - alusio  . Hernia repair      incisional hernia  . Breast biopsy  05/11/11    right breast  . Breast lumpectomy      right breast  . Back surgery  1983    lumbarectomy  . Finger surgery    . Joint replacement Bilateral     Dr. Maureen Ralphs   Family History  Problem Relation Age of Onset  . Coronary artery disease      family hx - female 1st degree relative <60  . Hypothyroidism      aunt   . Uterine cancer Mother 41  . Breast cancer Daughter 44    BRCA negative (no BART)  . Pancreatic cancer Maternal Grandfather     diagnosed in his 63s  . Anesthesia problems Neg Hx   . Prostate cancer Maternal Uncle     diagnosed in his 43s  . Colon cancer Cousin   . Prostate cancer Paternal Uncle     diagnosed in his 27s  . Clotting disorder Maternal Grandmother   . Uterine  cancer Cousin   . Leukemia Cousin   . Prostate cancer Cousin    History  Substance Use Topics  . Smoking status: Never Smoker   . Smokeless tobacco: Never Used  . Alcohol Use: No   OB History    No data available     Review of Systems  All other systems reviewed and are negative.     Allergies  Shellfish allergy; Percocet; Iodinated diagnostic agents; and Iodine  Home Medications   Prior to Admission medications   Medication Sig Start Date End Date Taking? Authorizing Provider  anastrozole (ARIMIDEX) 1 MG tablet Take 1 tablet (1 mg total) by mouth daily. 03/12/14  Yes Chauncey Cruel, MD  cholecalciferol (VITAMIN D) 1000 UNITS tablet Take 2,000 Units by mouth daily.   Yes Historical Provider, MD  Cyanocobalamin (B-12 PO) Take 1 tablet by mouth daily.    Yes Historical Provider, MD  levothyroxine (SYNTHROID, LEVOTHROID) 137 MCG tablet Take 137 mcg by mouth daily.    Yes Historical Provider, MD  Nutritional Supplements (JUICE PLUS FIBRE) LIQD Take 4 capsules by mouth 3  (three) times daily. 4 vegetable capsules with breakfast, 4 fruit capsules with lunch and 4 berry capsules with dinner.   Yes Historical Provider, MD  Nutritional Supplements (REPLETE PO) Take 1 tablet by mouth daily.   Yes Historical Provider, MD  triamcinolone cream (KENALOG) 0.1 % Apply 1 application topically 2 (two) times daily.   Yes Historical Provider, MD  fluconazole (DIFLUCAN) 150 MG tablet Take 1 tablet (150 mg total) by mouth once. Patient not taking: Reported on 05/20/2014 04/02/14   [name], MD  loperamide (IMODIUM) 2 MG capsule Take 1 capsule (2 mg total) by mouth as needed for diarrhea or loose stools. Patient not taking: Reported on 05/20/2014 03/24/14   Velta Addison Mikhail, DO  nystatin (MYCOSTATIN) 100000 UNIT/ML suspension Take 5 mLs (500,000 Units total) by mouth 4 (four) times daily. Patient not taking: Reported on 05/20/2014 04/02/14   [name], MD  saccharomyces boulardii (FLORASTOR) 250 MG capsule Take 1 capsule (250 mg total) by mouth 2 (two) times daily. Patient not taking: Reported on 05/20/2014 03/24/14   Maryann Mikhail, DO   BP 133/65 mmHg  Pulse 70  Temp(Src) 98.8 F (37.1 C) (Oral)  Resp 18  SpO2 98% Physical Exam  Nursing note and vitals reviewed.  81 year old female, resting comfortably and in no acute distress. Vital signs are normal. Oxygen saturation is 98%, which is normal. Head is normocephalic and atraumatic. PERRLA, EOMI. Oropharynx is clear. Neck is nontender and supple without adenopathy or JVD. Back is nontender and there is no CVA tenderness. Lungs are clear without rales, wheezes, or rhonchi. Chest is nontender. Heart has regular rate and rhythm without murmur. Abdomen is soft, flat, nontender without masses or hepatosplenomegaly and peristalsis is normoactive. Extremities: There is 2+ edema which is asymmetric-the right side is more affected than the left. Moderate to severe venous stasis changes are present bilaterally. Scar from recent skin  infection is present in the right lower leg and is healing well without any erythema or drainage. There is moderate erythema and swelling of the right second toe and it seems to be centered on the middle phalanx. This is quite tender. There is prompt capillary refill and dorsalis pedis pulses are strong. She does have 2+ pedal edema with some mild tenderness in that area. Skin is warm and dry without rash. Neurologic: Mental status is normal, cranial nerves are intact, there  are no motor or sensory deficits.  ED Course  Procedures (including critical care time) Labs Review Results for orders placed or performed during the hospital encounter of 05/20/14  CBC with Differential  Result Value Ref Range   WBC 11.0 (H) 4.0 - 10.5 K/uL   RBC 4.26 3.87 - 5.11 MIL/uL   Hemoglobin 12.1 12.0 - 15.0 g/dL   HCT 36.6 36.0 - 46.0 %   MCV 85.9 78.0 - 100.0 fL   MCH 28.4 26.0 - 34.0 pg   MCHC 33.1 30.0 - 36.0 g/dL   RDW 14.2 11.5 - 15.5 %   Platelets 299 150 - 400 K/uL   Neutrophils Relative % 68 43 - 77 %   Neutro Abs 7.5 1.7 - 7.7 K/uL   Lymphocytes Relative 16 12 - 46 %   Lymphs Abs 1.7 0.7 - 4.0 K/uL   Monocytes Relative 15 (H) 3 - 12 %   Monocytes Absolute 1.6 (H) 0.1 - 1.0 K/uL   Eosinophils Relative 1 0 - 5 %   Eosinophils Absolute 0.1 0.0 - 0.7 K/uL   Basophils Relative 0 0 - 1 %   Basophils Absolute 0.0 0.0 - 0.1 K/uL  Basic metabolic panel  Result Value Ref Range   Sodium 135 135 - 145 mmol/L   Potassium 3.4 (L) 3.5 - 5.1 mmol/L   Chloride 103 101 - 111 mmol/L   CO2 24 22 - 32 mmol/L   Glucose, Bld 117 (H) 70 - 99 mg/dL   BUN 19 6 - 20 mg/dL   Creatinine, Ser 0.84 0.44 - 1.00 mg/dL   Calcium 9.3 8.9 - 10.3 mg/dL   GFR calc non Af Amer >60 >60 mL/min   GFR calc Af Amer >60 >60 mL/min   Anion gap 8 5 - 15  Sedimentation rate  Result Value Ref Range   Sed Rate 61 (H) 0 - 22 mm/hr    MDM   Final diagnoses:  Pain and swelling of toe, right    Ongoing pain and swelling of the  right second toe. Old records reviewed including podiatry office visits and they had run arthritis screen blood panel which is reported to have been negative and they felt that the swelling was related to venous insufficiency. She did receive a steroid injection. I do not see anything that looks like infection or gout. Screening lab work will be obtained.  WBC is minimally elevated but without a left shift. Sedimentation rate is moderately elevated, cause uncertain. I do not see indications for antibiotics at this point. Cause for her swelling is unclear. Since she did have a temporary response to a steroid injection, all give her a trial of a single dose of dexamethasone and she is discharged with a prescription for tramadol for pain. Follow-up with her PCP. She will need to see the physicians at [facility] and is advised to try to change the appointment to a sooner date.  Delora Fuel, MD 35/57/32 2025

## 2014-05-20 NOTE — Discharge Instructions (Signed)
The cause for your toe swelling and being painful is not clear. Please follow-up with vascular and vein specialists as scheduled. Tried calling them to see if your appointment can be moved sooner.  Tramadol tablets What is this medicine? TRAMADOL (TRA ma dole) is a pain reliever. It is used to treat moderate to severe pain in adults. This medicine may be used for other purposes; ask your health care provider or pharmacist if you have questions. COMMON BRAND NAME(S): Ultram What should I tell my health care provider before I take this medicine? They need to know if you have any of these conditions: -brain tumor -depression -drug abuse or addiction -head injury -if you frequently drink alcohol containing drinks -kidney disease or trouble passing urine -liver disease -lung disease, asthma, or breathing problems -seizures or epilepsy -suicidal thoughts, plans, or attempt; a previous suicide attempt by you or a family member -an unusual or allergic reaction to tramadol, codeine, other medicines, foods, dyes, or preservatives -pregnant or trying to get pregnant -breast-feeding How should I use this medicine? Take this medicine by mouth with a full glass of water. Follow the directions on the prescription label. If the medicine upsets your stomach, take it with food or milk. Do not take more medicine than you are told to take. Talk to your pediatrician regarding the use of this medicine in children. Special care may be needed. Overdosage: If you think you have taken too much of this medicine contact a poison control center or emergency room at once. NOTE: This medicine is only for you. Do not share this medicine with others. What if I miss a dose? If you miss a dose, take it as soon as you can. If it is almost time for your next dose, take only that dose. Do not take double or extra doses. What may interact with this medicine? Do not take this medicine with any of the following  medications: -MAOIs like Carbex, Eldepryl, Marplan, Nardil, and Parnate This medicine may also interact with the following medications: -alcohol or medicines that contain alcohol -antihistamines -benzodiazepines -bupropion -carbamazepine or oxcarbazepine -clozapine -cyclobenzaprine -digoxin -furazolidone -linezolid -medicines for depression, anxiety, or psychotic disturbances -medicines for migraine headache like almotriptan, eletriptan, frovatriptan, naratriptan, rizatriptan, sumatriptan, zolmitriptan -medicines for pain like pentazocine, buprenorphine, butorphanol, meperidine, nalbuphine, and propoxyphene -medicines for sleep -muscle relaxants -naltrexone -phenobarbital -phenothiazines like perphenazine, thioridazine, chlorpromazine, mesoridazine, fluphenazine, prochlorperazine, promazine, and trifluoperazine -procarbazine -warfarin This list may not describe all possible interactions. Give your health care provider a list of all the medicines, herbs, non-prescription drugs, or dietary supplements you use. Also tell them if you smoke, drink alcohol, or use illegal drugs. Some items may interact with your medicine. What should I watch for while using this medicine? Tell your doctor or health care professional if your pain does not go away, if it gets worse, or if you have new or a different type of pain. You may develop tolerance to the medicine. Tolerance means that you will need a higher dose of the medicine for pain relief. Tolerance is normal and is expected if you take this medicine for a long time. Do not suddenly stop taking your medicine because you may develop a severe reaction. Your body becomes used to the medicine. This does NOT mean you are addicted. Addiction is a behavior related to getting and using a drug for a non-medical reason. If you have pain, you have a medical reason to take pain medicine. Your doctor will tell you how much medicine  to take. If your doctor wants you  to stop the medicine, the dose will be slowly lowered over time to avoid any side effects. You may get drowsy or dizzy. Do not drive, use machinery, or do anything that needs mental alertness until you know how this medicine affects you. Do not stand or sit up quickly, especially if you are an older patient. This reduces the risk of dizzy or fainting spells. Alcohol can increase or decrease the effects of this medicine. Avoid alcoholic drinks. You may have constipation. Try to have a bowel movement at least every 2 to 3 days. If you do not have a bowel movement for 3 days, call your doctor or health care professional. Your mouth may get dry. Chewing sugarless gum or sucking hard candy, and drinking plenty of water may help. Contact your doctor if the problem does not go away or is severe. What side effects may I notice from receiving this medicine? Side effects that you should report to your doctor or health care professional as soon as possible: -allergic reactions like skin rash, itching or hives, swelling of the face, lips, or tongue -breathing difficulties, wheezing -confusion -itching -light headedness or fainting spells -redness, blistering, peeling or loosening of the skin, including inside the mouth -seizures Side effects that usually do not require medical attention (report to your doctor or health care professional if they continue or are bothersome): -constipation -dizziness -drowsiness -headache -nausea, vomiting This list may not describe all possible side effects. Call your doctor for medical advice about side effects. You may report side effects to FDA at 1-800-FDA-1088. Where should I keep my medicine? Keep out of the reach of children. Store at room temperature between 15 and 30 degrees C (59 and 86 degrees F). Keep container tightly closed. Throw away any unused medicine after the expiration date. NOTE: This sheet is a summary. It may not cover all possible information. If  you have questions about this medicine, talk to your doctor, pharmacist, or health care provider.  2015, Elsevier/Gold Standard. (2009-09-10 11:55:44)

## 2014-05-21 NOTE — Telephone Encounter (Signed)
lmom 

## 2014-05-23 ENCOUNTER — Encounter: Payer: Self-pay | Admitting: Vascular Surgery

## 2014-05-24 ENCOUNTER — Ambulatory Visit (INDEPENDENT_AMBULATORY_CARE_PROVIDER_SITE_OTHER): Payer: PPO | Admitting: Vascular Surgery

## 2014-05-24 ENCOUNTER — Ambulatory Visit (HOSPITAL_COMMUNITY)
Admission: RE | Admit: 2014-05-24 | Discharge: 2014-05-24 | Disposition: A | Discharge status: Home / Self Care | Payer: PPO | Source: Ambulatory Visit | Attending: Vascular Surgery | Admitting: Vascular Surgery

## 2014-05-24 ENCOUNTER — Encounter: Payer: Self-pay | Admitting: Vascular Surgery

## 2014-05-24 VITALS — BP 129/51 | HR 62 | Ht 68.0 in | Wt 225.8 lb

## 2014-05-24 DIAGNOSIS — I872 Venous insufficiency (chronic) (peripheral): Secondary | ICD-10-CM

## 2014-05-24 DIAGNOSIS — M25475 Effusion, left foot: Secondary | ICD-10-CM

## 2014-05-24 NOTE — Progress Notes (Signed)
Referred by:  Rowe Clack, MD [address] [place], [place] [address]  Reason for referral: Swollen right leg   History of Present Illness  Rachael [name] is a 81 y.o. (1933/10/16) female who presents with chief complaint: swollen right foot and ankle with erythema and dusky skin surrounding  the base of the second toe.  She was seen by a Podiatrist 2 weeks ago and was given an injection at the base of the second toe.  This past Sunday 05/19/2014 she had sever right LE swelling and her second toe was blue/purple in color.  She stopped wearing her compression hose for 3 days and ended up going to the ED for evaluation.     Patient notes, onset of swelling 3 months ago after an injury to her shin, associated with dependent position and prolonged sitting or ambulation.   The patient has had no history of DVT, has a history of pregnancy, hasa history of varicose vein, no history of venous stasis ulcers, no history of  Lymphedema and positive history of skin changes in lower legs.  There is a family history of venous disorders.  The patient has  used compression stockings in the past, and is currently using them.  Her past medical history is significant for chronic venous insufficiency with history of left and right LE ablation procedure, hypothyroidism and breast CA.  She denise DM, hyperlipidemia and hypertension.  Past Medical History  Diagnosis Date  . Chronic venous insufficiency   . PVD (peripheral vascular disease)     mild carotid 11/05  . Osteoarthritis     bilat hips, severe  . Obesity   . CONTACT DERMATITIS   . GLUCOSE INTOLERANCE   . VITAMIN D DEFICIENCY   . Edema     pt  states had edema of feet and legs has been on lasix per dr. to help  . CAROTID STENOSIS   . DUPUYTREN'S CONTRACTURE   . MENOPAUSAL SYNDROME   . HLD (hyperlipidemia)   . Hypothyroidism     Dr. Ala Dach in [place]   . Breast cancer     R    Past Surgical History    Procedure Laterality Date  . 2d echo  11/29/03    normal LV size. normal EF   . Normal caronary arteries      per pt. 2003  . Cholecystectomy    . Inguinal hernia repair    . Tonsillectomy    . Right rotator cuff surgery    . Total hip arthroplasty  05/2008 and 10/2008    B hip - alusio  . Hernia repair      incisional hernia  . Breast biopsy  05/11/11    right breast  . Breast lumpectomy      right breast  . Back surgery  1983    lumbarectomy  . Finger surgery    . Joint replacement Bilateral     Dr. Maureen Ralphs    History   Social History  . Marital Status: Divorced    Spouse Name: N/A  . Number of Children: N/A  . Years of Education: N/A   Occupational History  . Not on file.   Social History Main Topics  . Smoking status: Never Smoker   . Smokeless tobacco: Never Used  . Alcohol Use: No  . Drug Use: No  . Sexual Activity: Not Currently   Other Topics Concern  . Not on file  Social History Narrative   Divorced - helps care for elderly mother.   Prior work-sales. Water aerobics 6/week- 3d water, 3d cardio.     Family History  Problem Relation Age of Onset  . Coronary artery disease      family hx - female 1st degree relative <60  . Hypothyroidism      aunt   . Uterine cancer Mother 83  . Cancer Mother   . Breast cancer Daughter 85    BRCA negative (no BART)  . Cancer Daughter   . Pancreatic cancer Maternal Grandfather     diagnosed in his 87s  . Anesthesia problems Neg Hx   . Prostate cancer Maternal Uncle     diagnosed in his 3s  . Colon cancer Cousin   . Prostate cancer Paternal Uncle     diagnosed in his 23s  . Clotting disorder Maternal Grandmother   . Uterine cancer Cousin   . Leukemia Cousin   . Prostate cancer Cousin   . Heart disease Father     before age 18  . Varicose Veins Father   . Heart disease Brother     before age 16  . Cancer Sister       Current Outpatient Prescriptions on File Prior to Visit  Medication Sig  Dispense Refill  . anastrozole (ARIMIDEX) 1 MG tablet Take 1 tablet (1 mg total) by mouth daily. 90 tablet 3  . cholecalciferol (VITAMIN D) 1000 UNITS tablet Take 2,000 Units by mouth daily.    . Cyanocobalamin (B-12 PO) Take 1 tablet by mouth daily.     Marland Kitchen levothyroxine (SYNTHROID, LEVOTHROID) 137 MCG tablet Take 137 mcg by mouth daily.     . Nutritional Supplements (JUICE PLUS FIBRE) LIQD Take 4 capsules by mouth 3 (three) times daily. 4 vegetable capsules with breakfast, 4 fruit capsules with lunch and 4 berry capsules with dinner.    . Nutritional Supplements (REPLETE PO) Take 1 tablet by mouth daily.    Marland Kitchen triamcinolone cream (KENALOG) 0.1 % Apply 1 application topically 2 (two) times daily.    . traMADol (ULTRAM) 50 MG tablet Take 1 tablet (50 mg total) by mouth every 6 (six) hours as needed. (Patient not taking: Reported on 05/24/2014) 15 tablet 0   No current facility-administered medications on file prior to visit.    Allergies  Allergen Reactions  . Shellfish Allergy Anaphylaxis and Hives    Hives all over the body.  . Percocet [Oxycodone-Acetaminophen] Hives  . Iodinated Diagnostic Agents     PT IS NOT AWARE OF IODINE ALLERGY, PREMEDICATED FOR PRIOR PREMEDS ONLY//A.C.  . Iodine Hives      REVIEW OF SYSTEMS:  (Positives checked otherwise negative)  CARDIOVASCULAR:  []  chest pain, []  chest pressure, []  palpitations, []  shortness of breath when laying flat, []  shortness of breath with exertion,  []  pain in feet when walking, []  pain in feet when laying flat, []  history of blood clot in veins (DVT), []  history of phlebitis, []  swelling in legs, []  varicose veins  PULMONARY:  []  productive cough, []  asthma, []  wheezing  NEUROLOGIC:  []  weakness in arms or legs, []  numbness in arms or legs, []  difficulty speaking or slurred speech, []  temporary loss of vision in one eye, []  dizziness  HEMATOLOGIC:  []  bleeding problems, []  problems with blood clotting too easily  MUSCULOSKEL:  []   joint pain, [x]  joint swelling  GASTROINTEST:  []  vomiting blood, []  blood in stool     GENITOURINARY:  []   burning with urination, []  blood in urine  PSYCHIATRIC:  []  history of major depression  INTEGUMENTARY:  []  rashes, []  ulcers  CONSTITUTIONAL:  []  fever, []  chills   Physical Examination Filed Vitals:   05/24/14 1318  BP: 129/51  Pulse: 62  Height: 5' 8"  (1.727 m)  Weight: 225 lb 12.8 oz (102.422 kg)  SpO2: 98%   Body mass index is 34.34 kg/(m^2).  General: A&O x 3, WDWN  Head: [place], Temporalis wasting / Prominent temp pulse  Ear/Nose/Throat: Hearing grossly intact, nares w/o erythema or drainage, oropharynx w/o Erythema/Exudate  Eyes: PE, EOMI  Neck: Supple, no nuchal rigidity  Pulmonary: Sym exp, good air movt, CTAB, no rales, rhonchi, & wheezing  Cardiac: RRR, Nl S1, S2, no Murmurs, rubs or gallops  Vascular: Vessel Right Left  Radial Palpable Palpable  Brachial Palpable Palpable  Carotid Palpable, without bruit Palpable, without bruit  Aorta Not palpable N/A  Femoral Palpable Palpable  Popliteal Not palpable Not palpable  PT notPalpable notPalpable  DP Palpable Palpable   Gastrointestinal: soft, NTND, -G/R, - HSM, - masses, - CVAT B  Musculoskeletal: M/S 5/5 throughout , Extremities without ischemic changes, edematous right 2nd interphalangeal joint  Neurologic: CN 2-12 intact , Pain and light touch intact in extremities , Motor exam as listed above  Psychiatric: Judgment intact, Mood & affect appropriate for pt's clinical situation  Dermatologic: See M/S exam for extremity exam, no rashes otherwise noted  Lymph : No Cervical or Inguinal lymphadenopathy    Non-Invasive Vascular Imaging  BLE Venous Insufficiency Duplex (Date: 05/24/2014):   RLE: neg DVT and SVT, neg GSV reflux, pos deep venous reflux  LLE: neg DVT and SVT, neg GSV reflux, pos deep venous reflux    Medical Decision Making  Rachael [name] is a 81 y.o. female who presents  with: right LE chronic venous insufficiency (C4) with right 2nd PIP joint swelling   Based on the patient's history and examination, we recommend: She follow up with Dr. Doran Durand at [facility] [place] for work regarding her right second toe.  Dr. Bridgett Larsson discussed with the patient the use of her 20-30 mm thigh high compression stockings for her lifetime everyday.  Thank you for allowing [place] to participate in this patient's care.  Theda Sers [facility] PA-C [facility] [place] Office: [phone]  The patient was seen in conjunction with Dr. Bridgett Larsson   05/24/2014, 1:49 PM   Addendum  I have independently interviewed and examined the patient, and I agree with the physician assistant's findings.  Pt has significant deep venous reflux which does NOT account for her primary complaint.  In my opinion, her R 2nd PIP swelling was incorrectly managed.  She probably should have had the joint aspirated at the time of initial presentation.  I suspect that this patient has some type crystal deposition in her joint that accounts for her ED findings.  Adele Barthel, MD [facility] [place] Office: [phone] Pager: [phone]  05/24/2014, 4:44 PM

## 2014-06-06 ENCOUNTER — Telehealth: Payer: Self-pay | Admitting: Vascular Surgery

## 2014-06-06 NOTE — Addendum Note (Signed)
Addended by: Mena Goes on: 06/06/2014 11:24 AM   Modules accepted: Orders

## 2014-06-06 NOTE — Telephone Encounter (Signed)
Told patient that Dr. Nona Dell office has been trying to call them to set up an appointment. [phone]. Pt said she would call them right away.

## 2014-07-03 ENCOUNTER — Encounter (HOSPITAL_COMMUNITY): Payer: PPO

## 2014-07-03 ENCOUNTER — Encounter: Payer: PPO | Admitting: Vascular Surgery

## 2014-07-08 ENCOUNTER — Encounter: Payer: Self-pay | Admitting: Family Medicine

## 2014-07-08 ENCOUNTER — Telehealth: Payer: Self-pay | Admitting: Family Medicine

## 2014-07-08 ENCOUNTER — Other Ambulatory Visit: Payer: Self-pay

## 2014-07-08 ENCOUNTER — Ambulatory Visit (INDEPENDENT_AMBULATORY_CARE_PROVIDER_SITE_OTHER): Payer: PPO | Admitting: Family Medicine

## 2014-07-08 VITALS — Temp 98.4°F | Ht 68.0 in | Wt 223.0 lb

## 2014-07-08 DIAGNOSIS — L259 Unspecified contact dermatitis, unspecified cause: Secondary | ICD-10-CM | POA: Diagnosis not present

## 2014-07-08 MED ORDER — METHYLPREDNISOLONE ACETATE 80 MG/ML IJ SUSP
120.0000 mg | Freq: Once | INTRAMUSCULAR | Status: AC
  Administered 2014-07-08: 120 mg via INTRAMUSCULAR

## 2014-07-08 MED ORDER — PREDNISONE 10 MG PO TABS: ORAL_TABLET | ORAL | Status: DC

## 2014-07-08 NOTE — Addendum Note (Signed)
Addended by: Aggie Hacker A on: 07/08/2014 11:55 AM   Modules accepted: Orders

## 2014-07-08 NOTE — Progress Notes (Signed)
Pre visit review using our clinic review tool, if applicable. No additional management support is needed unless otherwise documented below in the visit note. 

## 2014-07-08 NOTE — Progress Notes (Signed)
   Subjective:    Patient ID: Rachael [name], female    DOB: 09-04-1933, 81 y.o.   MRN: [id number]  HPI Here for a bout of poison ivy on the left arm which started 2 days ago but which is spreading rapidly. She is using cortisone cream.    Review of Systems  Constitutional: Negative.   Respiratory: Negative.   Cardiovascular: Negative.   Skin: Positive for rash.       Objective:   Physical Exam  Constitutional: She appears well-developed and well-nourished.  Cardiovascular: Normal rate, regular rhythm, normal heart sounds and intact distal pulses.   Pulmonary/Chest: Effort normal and breath sounds normal. No respiratory distress.  Skin:  Large patches of erythematous wheals on left arm           Assessment & Plan:  Contact dermatitis. Given a steroid shot and a taper of oral prednisone.

## 2014-07-08 NOTE — Telephone Encounter (Signed)
Patient Name: Rachael [name] DOB: [birth year] Initial Comment Caller states got poison ivy working in yard on Sat, asking if something can be called in. [phone] Nurse Assessment Nurse: Marcelline Deist, RN, Kermit Balo Date/Time (Eastern Time): 07/08/2014 10:19:47 AM Confirm and document reason for call. If symptomatic, describe symptoms. ---Caller states got poison ivy working in yard on Sat, asking if something can be called in. [phone]. The poison ivy started on left wrist, then has traveled up her arm to elbow. Using Hydrocortisone cream for itching without much improvement. Has had bad reactions in past d/t spreading of poison ivy. Uses [facility] [phone]. Allergic to Percocet & shellfish. Has the patient traveled out of the country within the last 30 days? ---Not Applicable Does the patient require triage? ---Yes Related visit to physician within the last 2 weeks? ---No Does the PT have any chronic conditions? (i.e. diabetes, asthma, etc.) ---Yes List chronic conditions. ---thyroid Guidelines Guideline Title Affirmed Question Affirmed Notes Poison Ivy - Oak - Sumac Severe poison ivy, oak, or sumac reaction in the past Final Disposition User See Physician within [place], Therapist, sports, [employer]

## 2014-07-08 NOTE — Telephone Encounter (Signed)
Pt is on schedule for today to see Dr. Sarajane Jews.

## 2014-08-26 ENCOUNTER — Other Ambulatory Visit: Payer: Self-pay

## 2014-08-26 DIAGNOSIS — Z853 Personal history of malignant neoplasm of breast: Secondary | ICD-10-CM

## 2014-08-29 ENCOUNTER — Other Ambulatory Visit: Payer: Self-pay | Admitting: Oncology

## 2014-08-29 DIAGNOSIS — Z853 Personal history of malignant neoplasm of breast: Secondary | ICD-10-CM

## 2014-09-09 ENCOUNTER — Encounter: Payer: Self-pay | Admitting: Family Medicine

## 2014-09-09 ENCOUNTER — Ambulatory Visit (INDEPENDENT_AMBULATORY_CARE_PROVIDER_SITE_OTHER): Payer: PPO | Admitting: Family Medicine

## 2014-09-09 VITALS — BP 149/72 | HR 60 | Temp 98.2°F | Ht 68.0 in | Wt 229.0 lb

## 2014-09-09 DIAGNOSIS — E559 Vitamin D deficiency, unspecified: Secondary | ICD-10-CM

## 2014-09-09 DIAGNOSIS — R233 Spontaneous ecchymoses: Secondary | ICD-10-CM

## 2014-09-09 DIAGNOSIS — E039 Hypothyroidism, unspecified: Secondary | ICD-10-CM

## 2014-09-09 LAB — CBC WITH DIFFERENTIAL/PLATELET
BASOS ABS: 0 10*3/uL (ref 0.0–0.1)
Basophils Relative: 0.4 % (ref 0.0–3.0)
EOS ABS: 0.3 10*3/uL (ref 0.0–0.7)
EOS PCT: 3.1 % (ref 0.0–5.0)
HCT: 38.6 % (ref 36.0–46.0)
HEMOGLOBIN: 12.8 g/dL (ref 12.0–15.0)
Lymphocytes Relative: 25 % (ref 12.0–46.0)
Lymphs Abs: 2.6 10*3/uL (ref 0.7–4.0)
MCHC: 33.1 g/dL (ref 30.0–36.0)
MCV: 87 fl (ref 78.0–100.0)
MONO ABS: 0.9 10*3/uL (ref 0.1–1.0)
Monocytes Relative: 8.8 % (ref 3.0–12.0)
NEUTROS PCT: 62.7 % (ref 43.0–77.0)
Neutro Abs: 6.5 10*3/uL (ref 1.4–7.7)
Platelets: 356 10*3/uL (ref 150.0–400.0)
RBC: 4.44 Mil/uL (ref 3.87–5.11)
RDW: 15.2 % (ref 11.5–15.5)
WBC: 10.3 10*3/uL (ref 4.0–10.5)

## 2014-09-09 LAB — PROTIME-INR
INR: 1.1 ratio — AB (ref 0.8–1.0)
PROTHROMBIN TIME: 12.5 s (ref 9.6–13.1)

## 2014-09-09 LAB — VITAMIN D 25 HYDROXY (VIT D DEFICIENCY, FRACTURES): VITD: 24.35 ng/mL — ABNORMAL LOW (ref 30.00–100.00)

## 2014-09-09 LAB — BASIC METABOLIC PANEL
BUN: 28 mg/dL — ABNORMAL HIGH (ref 6–23)
CHLORIDE: 105 meq/L (ref 96–112)
CO2: 30 meq/L (ref 19–32)
CREATININE: 0.82 mg/dL (ref 0.40–1.20)
Calcium: 9.8 mg/dL (ref 8.4–10.5)
GFR: 71.52 mL/min (ref >60.00)
Glucose, Bld: 86 mg/dL (ref 70–99)
Potassium: 3.9 mEq/L (ref 3.5–5.1)
Sodium: 141 mEq/L (ref 135–145)

## 2014-09-09 LAB — HEPATIC FUNCTION PANEL
ALK PHOS: 74 U/L (ref 39–117)
ALT: 11 U/L (ref 0–35)
AST: 16 U/L (ref 0–37)
Albumin: 3.9 g/dL (ref 3.5–5.2)
Bilirubin, Direct: 0.1 mg/dL (ref 0.0–0.3)
TOTAL PROTEIN: 6.8 g/dL (ref 6.0–8.3)
Total Bilirubin: 0.8 mg/dL (ref 0.2–1.2)

## 2014-09-09 LAB — APTT: APTT: 29.1 s (ref 23.4–32.7)

## 2014-09-09 LAB — TSH: TSH: 0.41 u[IU]/mL (ref 0.35–4.50)

## 2014-09-09 LAB — T4, FREE: Free T4: 1.48 ng/dL (ref 0.60–1.60)

## 2014-09-09 LAB — T3, FREE: T3, Free: 2.8 pg/mL (ref 2.3–4.2)

## 2014-09-09 NOTE — Progress Notes (Signed)
Pre visit review using our clinic review tool, if applicable. No additional management support is needed unless otherwise documented below in the visit note. 

## 2014-09-09 NOTE — Progress Notes (Signed)
   Subjective:    Patient ID: Rachael [name], female    DOB: 05-Jan-1934, 81 y.o.   MRN: [id number]  HPI Here for 3 weeks of bruising on both arms. No recent trauma. No other areas of her body are showing any bruising. She feels fine. She is not on any blood thinners and she does not take aspirin. Her labs were normal in May.    Review of Systems  Constitutional: Negative.   Respiratory: Negative.   Cardiovascular: Negative.   Gastrointestinal: Negative.   Genitourinary: Negative.   Neurological: Negative.        Objective:   Physical Exam  Constitutional: She is oriented to person, place, and time. She appears well-developed and well-nourished.  Neck: No thyromegaly present.  Cardiovascular: Normal rate, regular rhythm, normal heart sounds and intact distal pulses.   Pulmonary/Chest: Effort normal and breath sounds normal.  Abdominal: Soft. Bowel sounds are normal. She exhibits no distension and no mass. There is no tenderness. There is no rebound and no guarding.  Lymphadenopathy:    She has no cervical adenopathy.  Neurological: She is alert and oriented to person, place, and time.  Skin:  Multiple ecchymoses and petechiae on both arms, otherwise her skin is clear           Assessment & Plan:  Bruising of uncertain etiology. Check labs today including a CBC and PT/PTT.

## 2014-09-11 ENCOUNTER — Telehealth: Payer: Self-pay | Admitting: Family Medicine

## 2014-09-11 NOTE — Telephone Encounter (Signed)
Pt needs blood work results °

## 2014-09-13 NOTE — Telephone Encounter (Signed)
I spoke wit pt and went over results.

## 2014-09-30 ENCOUNTER — Other Ambulatory Visit: Payer: Self-pay

## 2014-09-30 DIAGNOSIS — C50911 Malignant neoplasm of unspecified site of right female breast: Secondary | ICD-10-CM

## 2014-10-01 ENCOUNTER — Encounter: Payer: Self-pay | Admitting: Nurse Practitioner

## 2014-10-01 ENCOUNTER — Telehealth: Payer: Self-pay | Admitting: Nurse Practitioner

## 2014-10-01 ENCOUNTER — Other Ambulatory Visit (HOSPITAL_BASED_OUTPATIENT_CLINIC_OR_DEPARTMENT_OTHER): Payer: PPO

## 2014-10-01 ENCOUNTER — Ambulatory Visit (HOSPITAL_BASED_OUTPATIENT_CLINIC_OR_DEPARTMENT_OTHER): Payer: PPO | Admitting: Nurse Practitioner

## 2014-10-01 VITALS — BP 144/57 | HR 62 | Temp 98.4°F | Resp 18 | Ht 68.0 in | Wt 230.6 lb

## 2014-10-01 DIAGNOSIS — C50911 Malignant neoplasm of unspecified site of right female breast: Secondary | ICD-10-CM

## 2014-10-01 DIAGNOSIS — E2839 Other primary ovarian failure: Secondary | ICD-10-CM | POA: Diagnosis not present

## 2014-10-01 LAB — CBC WITH DIFFERENTIAL/PLATELET
BASO%: 0.4 % (ref 0.0–2.0)
BASOS ABS: 0 10*3/uL (ref 0.0–0.1)
EOS ABS: 0.3 10*3/uL (ref 0.0–0.5)
EOS%: 3.6 % (ref 0.0–7.0)
HCT: 35.8 % (ref 34.8–46.6)
HEMOGLOBIN: 11.7 g/dL (ref 11.6–15.9)
LYMPH%: 25 % (ref 14.0–49.7)
MCH: 29.1 pg (ref 25.1–34.0)
MCHC: 32.7 g/dL (ref 31.5–36.0)
MCV: 89.1 fL (ref 79.5–101.0)
MONO#: 0.9 10*3/uL (ref 0.1–0.9)
MONO%: 11 % (ref 0.0–14.0)
NEUT%: 60 % (ref 38.4–76.8)
NEUTROS ABS: 4.7 10*3/uL (ref 1.5–6.5)
PLATELETS: 308 10*3/uL (ref 145–400)
RBC: 4.02 10*6/uL (ref 3.70–5.45)
RDW: 15 % — AB (ref 11.2–14.5)
WBC: 7.8 10*3/uL (ref 3.9–10.3)
lymph#: 2 10*3/uL (ref 0.9–3.3)

## 2014-10-01 LAB — COMPREHENSIVE METABOLIC PANEL (CC13)
ALBUMIN: 3.5 g/dL (ref 3.5–5.0)
ALK PHOS: 94 U/L (ref 40–150)
ALT: 12 U/L (ref 0–55)
ANION GAP: 6 meq/L (ref 3–11)
AST: 17 U/L (ref 5–34)
BILIRUBIN TOTAL: 0.69 mg/dL (ref 0.20–1.20)
BUN: 21.6 mg/dL (ref 7.0–26.0)
CALCIUM: 9.1 mg/dL (ref 8.4–10.4)
CO2: 28 mEq/L (ref 22–29)
Chloride: 108 mEq/L (ref 98–109)
Creatinine: 0.8 mg/dL (ref 0.6–1.1)
EGFR: 67 mL/min/{1.73_m2} — AB (ref >90)
GLUCOSE: 89 mg/dL (ref 70–140)
POTASSIUM: 4.2 meq/L (ref 3.5–5.1)
SODIUM: 142 meq/L (ref 136–145)
TOTAL PROTEIN: 6.2 g/dL — AB (ref 6.4–8.3)

## 2014-10-01 NOTE — Telephone Encounter (Signed)
Gave avs & calendar for September 2016,2017

## 2014-10-01 NOTE — Progress Notes (Signed)
ID: Rachael [name]   DOB: 04/29/1933  MR#: [id number]  CSN#:[id number]  PCP: FRY,STEPHEN A, MD GYN: Michelle Grewal, MD SU: Paul Toth, MD OTHER MD:  James Hochrein, MD;  Fred Lupton, MD; Stacy Wentworth, MD;  Malcolm Stark, MD  CHIEF COMPLAINT:  Right Breast Cancer  CURRENT TREATMENT: Anastrozole  BREAST CANCER HISTORY: From the original intake note:  [place] had routine screening mammography at [facility] 05/06/2011, showing an area of focal asymmetry in the lower inner quadrant of the right breast. Additional views 05/10/2011 confirmed a 9 mm area of irregularity without associated microcalcifications. Ultrasound of the right breast confirmed a 9 mm hypoechoic mass with posterior acoustic shadowing. Biopsy of this mass was performed the next day, and showed ([id number]) and invasive lobular carcinoma, E-cadherin negative, which was estrogen receptor positive at 97%, and progesterone receptor positive at 81%. There was no HER-2 amplification, and MIB-1 was 12%.  On may 07/31/2011 him of the patient underwent breast MRI, which showed a 1.5 cm mildly irregular enhancing mass in the upper inner quadrant of the right breast. There were no other masses of concern and no abnormal appearing lymph nodes. Accordingly, on 06/16/2011 the patient underwent right lumpectomy and sentinel lymph node sampling under Dr. P. J. Toth, the final pathology ([id number]) showing an invasive lobular carcinoma, grade 1, measuring 1.5 cm, with both sentinel lymph nodes negative.   The patient's subsequent history is as detailed below.  INTERVAL HISTORY: Rachael [name] returns today for follow up of her breast cancer. She has been on anastrozole for 3 years now and tolerates this drug well. She has occasional hot flashes, but denies vaginal changes, or arthralgias/myalgias. She injured herself early in the summer while exercising, and over extended her hip flexor. After a course of steroids and rest she improved back to baseline. She  continues to do water aerobics for exercise.  REVIEW OF SYSTEMS: Rachael [name] denies fevers, chills, nausea, vomiting, or changes in bowel or bladder habits. She eats extraordinarily well since she has started growing her own fruits and vegetables. She juices frequently as well. She denies shortness of breath, chest pain, cough, or palpitations. She has no headaches, dizziness, or vision changes. A detailed review of systems is otherwise stable.  PAST MEDICAL HISTORY: Past Medical History  Diagnosis Date  . Chronic venous insufficiency   . PVD (peripheral vascular disease)     mild carotid 11/05  . Osteoarthritis     bilat hips, severe  . Obesity   . CONTACT DERMATITIS   . GLUCOSE INTOLERANCE   . VITAMIN D DEFICIENCY   . Edema     pt  states had edema of feet and legs has been on lasix per dr. to help  . CAROTID STENOSIS   . DUPUYTREN'S CONTRACTURE   . MENOPAUSAL SYNDROME   . HLD (hyperlipidemia)   . Hypothyroidism     Dr. Rizvi in [place] endo   . Breast cancer     R    PAST SURGICAL HISTORY: Past Surgical History  Procedure Laterality Date  . 2d echo  11/29/03    normal LV size. normal EF   . Normal caronary arteries      per pt. 2003  . Cholecystectomy    . Inguinal hernia repair    . Tonsillectomy    . Right rotator cuff surgery    . Total hip arthroplasty  05/2008 and 10/2008    B hip - alusio  . Hernia repair        incisional hernia  . Breast biopsy  05/11/11    right breast  . Breast lumpectomy      right breast  . Back surgery  1983    lumbarectomy  . Finger surgery    . Joint replacement Bilateral     Dr. Alusio    FAMILY HISTORY Family History  Problem Relation Age of Onset  . Coronary artery disease      family hx - female 1st degree relative <60  . Hypothyroidism      aunt   . Uterine cancer Mother 45  . Cancer Mother   . Breast cancer Daughter 44    BRCA negative (no BART)  . Cancer Daughter   . Pancreatic cancer Maternal Grandfather      diagnosed in his 70s  . Anesthesia problems Neg Hx   . Prostate cancer Maternal Uncle     diagnosed in his 70s  . Colon cancer Cousin   . Prostate cancer Paternal Uncle     diagnosed in his 50s  . Clotting disorder Maternal Grandmother   . Uterine cancer Cousin   . Leukemia Cousin   . Prostate cancer Cousin   . Heart disease Father     before age 60  . Varicose Veins Father   . Heart disease Brother     before age 60  . Cancer Sister    the patient's father died at the age of 52 from heart disease. The patient's mother died in 2015 at the age of 96. She had uterine cancer in her 60s. There is a significant cancer family on her side, to be detailed when the patient meets with her genetics counselor. The patient herself has no sisters, does have 3 brothers. One of the patient's daughters, Rachael [name], was diagnosed with breast cancer at the age of 46. She was tested for the BRCA gene and was negative. There is no other breast or ovarian cancer in the immediate family to her knowledge.  GYNECOLOGIC HISTORY: Menarche age 17, menopause age 57. The patient tried Prempro briefly but did not like it. She is GX P5, first live birth age 26.  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 She is divorced and lives by herself. 3 of her 5 children live in town (Tori, Rory, and Todd; Tori [name] is also followed here w a history of breast cancer) and 2 live in [place] (Rachael [name] and Rachael [name]). She has 7 grandchildren. She attends the [employer].   ADVANCED DIRECTIVES: Not in place  HEALTH MAINTENANCE:  (Updated December 2014) Social History  Substance Use Topics  . Smoking status: Never Smoker   . Smokeless tobacco: Never Used  . Alcohol Use: No     Colonoscopy: 2009/Stark  PAP: Sept 2014/Grewal  B[facility] density: [facility] 09[facility]07[facility], Normal  Lipid panel: Sept 2014/ [facility]    Allergies  Allergen Reactions   . Shellfish Allergy Anaphylaxis and Hives    Hives all over the body.  . Percocet [Oxycodone-Acetaminophen] Hives  . Iodinated Diagnostic Agents     PT IS NOT AWARE OF IODINE ALLERGY, PREMEDICATED FOR PRIOR PREMEDS ONLY//A.C.  . Iodine Hives    Current Outpatient Prescriptions  Medication Sig Dispense Refill  . anastrozole (ARIMIDEX) 1 MG tablet Take 1 tablet (1 mg total) by mouth daily. 90 tablet 3  . cholecalciferol (VITAMIN D) 1000 UNITS tablet Take 2,000 Units by mouth daily.    . Cyanocobalamin (  B-12 PO) Take 1 tablet by mouth daily.     Marland Kitchen levothyroxine (SYNTHROID, LEVOTHROID) 137 MCG tablet Take 137 mcg by mouth daily.     . Nutritional Supplements (JUICE PLUS FIBRE) LIQD Take 4 capsules by mouth 3 (three) times daily. 4 vegetable capsules with breakfast, 4 fruit capsules with lunch and 4 berry capsules with dinner.    . Nutritional Supplements (REPLETE PO) Take 1 tablet by mouth daily.    . traMADol (ULTRAM) 50 MG tablet Take 1 tablet (50 mg total) by mouth every 6 (six) hours as needed. (Patient not taking: Reported on 05/24/2014) 15 tablet 0  . triamcinolone cream (KENALOG) 0.1 % Apply 1 application topically 2 (two) times daily.     No current facility-administered medications for this visit.    OBJECTIVE: Middle-aged white woman who appears stated age 78 Vitals:   10/01/14 1343  BP: 144/57  Pulse: 62  Temp: 98.4 F (36.9 C)  Resp: 18     Body mass index is 35.07 kg/(m^2).    ECOG FS: 2 Filed Weights   10/01/14 1343  Weight: 230 lb 9.6 oz (104.599 kg)   Skin: warm, dry  HEENT: sclerae anicteric, conjunctivae pink, oropharynx clear. No thrush or mucositis.  Lymph Nodes: No cervical or supraclavicular lymphadenopathy  Lungs: clear to auscultation bilaterally, no rales, wheezes, or rhonci  Heart: regular rate and rhythm  Abdomen: round, soft, non tender, positive bowel sounds  Musculoskeletal: No focal spinal tenderness, no peripheral edema  Neuro: non focal, well  oriented, positive affect  Breasts: right breast status post lumpectomy. No evidence of recurrent disease. Right axilla benign. Left breast unremarkable.   LAB RESULTS: Lab Results  Component Value Date   WBC 7.8 10/01/2014   NEUTROABS 4.7 10/01/2014   HGB 11.7 10/01/2014   HCT 35.8 10/01/2014   MCV 89.1 10/01/2014   PLT 308 10/01/2014      Chemistry      Component Value Date/Time   NA 141 09/09/2014 1532   NA 144 03/26/2014 1242   K 3.9 09/09/2014 1532   K 4.1 03/26/2014 1242   CL 105 09/09/2014 1532   CL 104 06/20/2012 1421   CO2 30 09/09/2014 1532   CO2 25 03/26/2014 1242   BUN 28* 09/09/2014 1532   BUN 20.3 03/26/2014 1242   CREATININE 0.82 09/09/2014 1532   CREATININE 1.0 03/26/2014 1242      Component Value Date/Time   CALCIUM 9.8 09/09/2014 1532   CALCIUM 9.9 03/26/2014 1242   ALKPHOS 74 09/09/2014 1532   ALKPHOS 71 03/26/2014 1242   AST 16 09/09/2014 1532   AST 52* 03/26/2014 1242   ALT 11 09/09/2014 1532   ALT 41 03/26/2014 1242   BILITOT 0.8 09/09/2014 1532   BILITOT 0.51 03/26/2014 1242       STUDIES: No results found.  ASSESSMENT: 81 y.o. [place] woman   (1)  s/p Right lumpectomy and sentinel lymph node sampling 06/16/2011 for a T1c N0, stage IA invasive lobular breast cancer, grade 1, strongly estrogen and progesterone positive, with no HER-2 amplification, and an Mib-1 of 12%  (2) the patient met with radiation oncology 07/14/2011, but opted against adjuvant radiation  (3) started anastrozole July 2013, briefly discontinued because of side effects but resumed October 2013.  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 other genes tested were normal and included APC, ATM, BARD1, BMPR1A, BRCA1, BRCA2, BRIP1, CDH1, CHEK2, EPCAM, MLH1, MRE11A, MSH6, MUTYH, NBN, PALB2, PMS2,  PTEN, RAD50, RAD51C, SMAD4, STK11 and TP53  PLAN:  Omah is doing well as far as her breast cancer is concerned. She is now 3  years out from her definitive surgery with no evidence of recurrent disease. The labs were reviewed in detail and were stable. She is tolerating the anastrozole well and will continue this drug until September 2018 to complete 5 years of antiestrogen therapy.   She is overdue for a repeat mammogram, so we will have that rescheduled to this month.   Denym will return in 1 year for labs and a follow up visit. Prior to this she will have a repeat bone density scan. She understands and agrees with this plan. She knows the goal of treatment in her case is cure. She has been encouraged to call with any issues that might arise before her next visit here.    Heather F Boelter, NP  10/01/2014   

## 2014-10-03 ENCOUNTER — Ambulatory Visit
Admission: RE | Admit: 2014-10-03 | Discharge: 2014-10-03 | Disposition: A | Discharge status: Home / Self Care | Payer: PPO | Source: Ambulatory Visit | Attending: Oncology | Admitting: Oncology

## 2014-10-03 DIAGNOSIS — Z853 Personal history of malignant neoplasm of breast: Secondary | ICD-10-CM

## 2014-10-04 ENCOUNTER — Telehealth: Payer: Self-pay | Admitting: *Deleted

## 2014-10-04 NOTE — Telephone Encounter (Signed)
Patient called stating that she has developed achiness in her legs. Patient states that it started this past Tuesday (10/01/14). Reassured patient that achiness is a common side effects and she could help relieve it by using a heating blanket/pain medication. Patient verbalized understanding and know to call back if this issue gets worse.

## 2014-10-23 ENCOUNTER — Ambulatory Visit (INDEPENDENT_AMBULATORY_CARE_PROVIDER_SITE_OTHER): Payer: PPO | Admitting: Family Medicine

## 2014-10-23 ENCOUNTER — Encounter: Payer: Self-pay | Admitting: Family Medicine

## 2014-10-23 VITALS — BP 164/78 | HR 70 | Temp 98.6°F | Ht 68.0 in | Wt 227.0 lb

## 2014-10-23 DIAGNOSIS — M109 Gout, unspecified: Secondary | ICD-10-CM | POA: Diagnosis not present

## 2014-10-23 MED ORDER — TRAMADOL HCL 50 MG PO TABS: 100.0000 mg | ORAL_TABLET | Freq: Four times a day (QID) | ORAL | Status: DC | PRN

## 2014-10-23 MED ORDER — PREDNISONE 10 MG PO TABS: ORAL_TABLET | ORAL | Status: DC

## 2014-10-23 NOTE — Progress Notes (Signed)
   Subjective:    Patient ID: Rachael [name], female    DOB: Aug 03, 1933, 81 y.o.   MRN: [id number]  HPI Here for one week of swelling and pain in the left wrist. No recent trauma. She has never had this before. Running warm water over it helps. Aleve helps a little.    Review of Systems  Constitutional: Negative.   Musculoskeletal: Positive for joint swelling and arthralgias.       Objective:   Physical Exam  Constitutional: She appears well-developed and well-nourished.  Musculoskeletal:  Left wrist is swollen, warm, and quite tender. No erythema          Assessment & Plan:  This is gout. Treat with a steroid taper.

## 2014-10-23 NOTE — Progress Notes (Signed)
Pre visit review using our clinic review tool, if applicable. No additional management support is needed unless otherwise documented below in the visit note. 

## 2014-11-04 ENCOUNTER — Telehealth: Payer: Self-pay | Admitting: *Deleted

## 2014-11-04 ENCOUNTER — Other Ambulatory Visit: Payer: Self-pay | Admitting: Oncology

## 2014-11-04 NOTE — Telephone Encounter (Signed)
TC from patient stating she thinks she is having reactions to her anastrozole.Marland Kitchen @ weeks ago she had c/o aching legs and then also developed some 'petechiae' on her arms. She saw her pcp and he recommended she go off her anastrozole for 2 weeks and see how she feels. She did stop it and her legs are feeling much better. However her arms continue to worsen with additional bruising and bleeding and blotchiness.  She states "I look like I've ben beaten up". Only medicine pt takes right now is Synthroid. She is inclined to stay off the anastrozole but is concerned about what is going on with her bleeding, bruised arms.  She had 'lab work' done with her pcp. Platelets etc were normal.   Pt wants to know what Dr. Jana Hakim thinks of all this.

## 2014-11-04 NOTE — Telephone Encounter (Signed)
This RN returned call to pt and obtained an identified VM- message left to return call to discuss her concerns further.  Noted labs with normal platelet count- inquiry is related to increased bruising post taking steroid taper pack for gout-   This RN left her name and office return call number.

## 2014-11-04 NOTE — Telephone Encounter (Signed)
Would suggest she stay off anastrozole until JAN 1, then restart. If the pain symptoms resume, she needs to see [place] for alternative,. Othewise she cna continue until scheduled visit sept 2017  The petechiae etc are not related to the anastrozole  Thanks!

## 2014-11-05 ENCOUNTER — Ambulatory Visit (INDEPENDENT_AMBULATORY_CARE_PROVIDER_SITE_OTHER): Payer: PPO | Admitting: Family Medicine

## 2014-11-05 ENCOUNTER — Encounter: Payer: Self-pay | Admitting: Family Medicine

## 2014-11-05 VITALS — BP 140/62 | HR 61 | Temp 100.3°F

## 2014-11-05 DIAGNOSIS — M1990 Unspecified osteoarthritis, unspecified site: Secondary | ICD-10-CM | POA: Diagnosis not present

## 2014-11-05 MED ORDER — PREDNISONE 10 MG PO TABS: ORAL_TABLET | ORAL | Status: DC

## 2014-11-05 NOTE — Progress Notes (Signed)
Pre visit review using our clinic review tool, if applicable. No additional management support is needed unless otherwise documented below in the visit note. Pt unable to weigh 

## 2014-11-05 NOTE — Progress Notes (Signed)
   Subjective:    Patient ID: Rachael [name], female    DOB: December 05, 1933, 81 y.o.   MRN: [id number]  HPI Here for the sudden onset of swelling and severe pain in the right wrist that started yesterday. No recent trauma. She just returned from a trip to [place] where she walked 4-5 miles a day. Of note, we saw her here on 10-23-14 for the same presentation on the left wrist. We thought this could have been a flare of gout and we treated her with a prednisone taper. This was very successful, and wrist responded very quickly. Today she also notes some mild swelling and pain in both feet and she feels tired and chilled. I asked her extensively about any symptoms that may indicate an infection and she denied all these, including URI sx, cough, NVD, urinary sx, etc.    Review of Systems  Constitutional: Positive for fever, chills and fatigue. Negative for diaphoresis.  HENT: Negative.   Eyes: Negative.   Respiratory: Negative.   Cardiovascular: Negative.   Gastrointestinal: Negative.   Endocrine: Negative.   Genitourinary: Negative.   Musculoskeletal: Positive for joint swelling and arthralgias.  Skin: Negative for rash and wound.  Neurological: Negative.        Objective:   Physical Exam  Constitutional: She appears well-developed and well-nourished.  HENT:  Right Ear: External ear normal.  Left Ear: External ear normal.  Nose: Nose normal.  Mouth/Throat: Oropharynx is clear and moist.  Eyes: Conjunctivae are normal.  Neck: Neck supple. No thyromegaly present.  Cardiovascular: Normal rate, regular rhythm, normal heart sounds and intact distal pulses.   Pulmonary/Chest: Effort normal and breath sounds normal.  Abdominal: Soft. Bowel sounds are normal. She exhibits no distension and no mass. There is no tenderness. There is no rebound and no guarding.  Musculoskeletal:  The right wrist is swollen, warm, and extremely tender. No erythema. The left wrist is totally normal today. Both feet  have 2+ edema and are mildly tender with no warmth or erythema.   Lymphadenopathy:    She has no cervical adenopathy.          Assessment & Plan:  This appears to be a flare of some type of inflammatory arthritis, more likely rheumatoid and less likely gout. We will start her on prednisone by taking 40 mg a day for 4 days. Then she will decrease to 20 mg a day and stay at this dose until we get more information. Use Tramadol for pain prn. Get labs including RF, CRP, ESR, CBC, and uric acid.

## 2014-11-06 ENCOUNTER — Telehealth: Payer: Self-pay | Admitting: Family Medicine

## 2014-11-06 LAB — C-REACTIVE PROTEIN: CRP: 18.2 mg/dL (ref 0.5–20.0)

## 2014-11-06 LAB — CBC WITH DIFFERENTIAL/PLATELET
Basophils Absolute: 0.6 10*3/uL — ABNORMAL HIGH (ref 0.0–0.1)
Basophils Relative: 2.7 % (ref 0.0–3.0)
EOS PCT: 0 % (ref 0.0–5.0)
Eosinophils Absolute: 0 10*3/uL (ref 0.0–0.7)
HCT: 41.1 % (ref 36.0–46.0)
Hemoglobin: 13.5 g/dL (ref 12.0–15.0)
LYMPHS ABS: 0.8 10*3/uL (ref 0.7–4.0)
Lymphocytes Relative: 4.1 % — ABNORMAL LOW (ref 12.0–46.0)
MCHC: 32.8 g/dL (ref 30.0–36.0)
MCV: 87.7 fl (ref 78.0–100.0)
MONO ABS: 1.8 10*3/uL — AB (ref 0.1–1.0)
MONOS PCT: 8.5 % (ref 3.0–12.0)
NEUTROS ABS: 17.6 10*3/uL — AB (ref 1.4–7.7)
NEUTROS PCT: 84.7 % — AB (ref 43.0–77.0)
PLATELETS: 278 10*3/uL (ref 150.0–400.0)
RBC: 4.69 Mil/uL (ref 3.87–5.11)
RDW: 15.8 % — AB (ref 11.5–15.5)
WBC: 20.8 10*3/uL (ref 4.0–10.5)

## 2014-11-06 LAB — URIC ACID: URIC ACID, SERUM: 6.4 mg/dL (ref 2.4–7.0)

## 2014-11-06 LAB — RHEUMATOID FACTOR: RHEUMATOID FACTOR: 16 [IU]/mL — AB (ref <=14)

## 2014-11-06 LAB — SEDIMENTATION RATE: Sed Rate: 47 mm/hr — ABNORMAL HIGH (ref 0–22)

## 2014-11-06 NOTE — Addendum Note (Signed)
Addended by: Alysia Penna A on: 11/06/2014 01:25 PM   Modules accepted: Orders

## 2014-11-06 NOTE — Telephone Encounter (Signed)
See my result note.

## 2014-11-06 NOTE — Telephone Encounter (Signed)
Pt called and would like a call back about her results. Pt said her oncologist gave her the results and she is not happy with the results.

## 2014-11-06 NOTE — Telephone Encounter (Signed)
This RN returned call to Rachael [name] and discussed MD review and recommendations.  Rachael [name] states she returned to primary MD yesterday due to continued swelling in right arm and leg-   She was very active last week per convention she attended.  Per discussion Rachael [name] states she had additional labs drawn " because he didn't think I had gout and wanted to work me up for other things"  Per call Rachael [name] will continue off the arimidex as recommended and follow up with primary MD per labs drawn yesterday.

## 2014-11-07 ENCOUNTER — Telehealth: Payer: Self-pay | Admitting: Family Medicine

## 2014-11-07 NOTE — Telephone Encounter (Signed)
[facility]  Patient Name: Rachael [name]  DOB: [birth year]    Initial Comment Caller states, has swelling in wrist and ankle, since Tues, Wrist still has some puffiness and pain, hot to touch. Ankle is better.    Nurse Assessment  Nurse: Harlow Mares, RN, Suanne Marker Date/Time Eilene Ghazi Time): 11/07/2014 11:15:24 AM  Confirm and document reason for call. If symptomatic, describe symptoms. ---Caller states, has swelling in wrist and ankle, since Tues, Wrist still has some puffiness and pain, hot to touch. Ankle is better. Saw Dr. Sarajane Jews on Prednisone and did blood work. Reports petechia on the arm as well. Reports MD is under the impression that this is Gout. (R) hand and (R) ankle are involved. Reports that she is taking pain pills but they kept her awake all night. Denies fever. Prednisone was begun on 11/05/14.  Has the patient traveled out of the country within the last 30 days? ---No  Does the patient have any new or worsening symptoms? ---Yes  Will a triage be completed? ---Yes  Related visit to physician within the last 2 weeks? ---Yes  Does the PT have any chronic conditions? (i.e. diabetes, asthma, etc.) ---Yes  List chronic conditions. ---cancer hx;     Guidelines    Guideline Title Affirmed Question Affirmed Notes  Hand and Wrist Pain Swollen joint of new onset    Final Disposition User   See PCP When Office is Open (within 3 days) Harlow Mares, RN, Suanne Marker    Comments  Appt scheduled with Dr. Sarajane Jews for 11/08/14 @ 10:30am at the [facility] location.   Disagree/Comply: Comply

## 2014-11-07 NOTE — Telephone Encounter (Signed)
I spoke with pt and went over below information. 

## 2014-11-07 NOTE — Telephone Encounter (Signed)
Can you cancel pt's appointment for Friday 11/08/2014 to see Dr. Sarajane Jews? She is going to follow up with the specialist.

## 2014-11-07 NOTE — Telephone Encounter (Signed)
FYI

## 2014-11-07 NOTE — Telephone Encounter (Signed)
done

## 2014-11-07 NOTE — Telephone Encounter (Signed)
Please tell her about the test results showing this is RA and not gout. I have initiated a referral to a specialist.  The prednisone should be helping but this is probably what is keeping her awake and not the pain pills (Tramadol). She can use the Tramadol as needed. She can still come in tomorrow if she wishes.

## 2014-11-07 NOTE — Telephone Encounter (Signed)
This is a duplicate note, see previous one.  

## 2014-11-08 ENCOUNTER — Ambulatory Visit: Payer: Self-pay | Admitting: Family Medicine

## 2014-11-11 ENCOUNTER — Telehealth: Payer: Self-pay | Admitting: Family Medicine

## 2014-11-11 NOTE — Telephone Encounter (Signed)
[facility] - Client [facility]  Patient Name: Rachael [name]  DOB: Dec 27, 1933    Initial Comment Caller states she has been nauseated and is having a skin - rash like reaction . Caller is wanting to go off of her medicine.    Nurse Assessment  Nurse: Genoveva Ill, RN, Lattie Haw Date/Time (Eastern Time): 11/11/2014 3:45:24 PM  Confirm and document reason for call. If symptomatic, describe symptoms. ---Caller states she has been nauseated and is having a skin - rash like reaction; wanting to get off prednisone and has petechiae on both arms from elbow to wrist and left arm into hand for about 2 mths and skin has broken open and bleeding in 3 different places started 3 days ago; dx with gout, but was told a few days ago, she has RA, instead; has lost 7 lbs this week and is dry heaving today  Has the patient traveled out of the country within the last 30 days? ---No  Does the patient have any new or worsening symptoms? ---Yes  Will a triage be completed? ---Yes  Related visit to physician within the last 2 weeks? ---Yes   10/25 dx gout?  Does the PT have any chronic conditions? (i.e. diabetes, asthma, etc.) ---Yes  List chronic conditions. ---RA; CA survivor     Guidelines    Guideline Title Affirmed Question Affirmed Notes  Rash - Purple Spots or Dots Patient sounds very sick or weak to the triager DUE TO PETECHIAL BLEEDING   Final Disposition User   Go to ED Now [place], RN, Lattie Haw    Comments  ADVISED WILL PLACE note in chart refusing outcome and someone will call her back; understanding verb; call placed to back line and reported to [place], who states will give note to Sunday Spillers, Dr. Barbie Banner nurse   Referrals  [facility] REFUSED   Disagree/Comply: Disagree  Disagree/Comply Reason: Disagree with instructions

## 2014-11-11 NOTE — Telephone Encounter (Signed)
I agree that  Some of her symptoms are side effects of the prednisone. We spoke to her by phone and advised her to taper off the prednisone by taking one pill (10 mg) a day for today and tomorrow, and then to stop it altogether. She can dress her wound with Neosporin and gauze. Hopefully she will be able to see Rheumatology soon. She understands and agrees.

## 2014-12-13 ENCOUNTER — Ambulatory Visit (INDEPENDENT_AMBULATORY_CARE_PROVIDER_SITE_OTHER): Payer: PPO | Admitting: Family Medicine

## 2014-12-13 ENCOUNTER — Encounter: Payer: Self-pay | Admitting: Family Medicine

## 2014-12-13 VITALS — BP 155/68 | HR 75 | Temp 100.0°F | Ht 68.0 in | Wt 215.0 lb

## 2014-12-13 DIAGNOSIS — R197 Diarrhea, unspecified: Secondary | ICD-10-CM

## 2014-12-13 NOTE — Progress Notes (Signed)
Pre visit review using our clinic review tool, if applicable. No additional management support is needed unless otherwise documented below in the visit note. 

## 2014-12-16 ENCOUNTER — Encounter: Payer: Self-pay | Admitting: Family Medicine

## 2014-12-16 NOTE — Progress Notes (Signed)
   Subjective:    Patient ID: Rachael [name], female    DOB: 02/23/1933, 81 y.o.   MRN: [id number]  HPI Here for 3 weeks of diarrhea, which usually comes shortly after eating a meal. No cramps or pain. No fever. No nausea or vomiting. No recent travels. No recent antibiotic use. The family wants to know if she has a bacterial infection and wants to check for C diff.    Review of Systems  Constitutional: Negative.   Respiratory: Negative.   Cardiovascular: Negative.   Gastrointestinal: Positive for diarrhea. Negative for nausea, vomiting, abdominal pain, constipation, blood in stool, abdominal distention, anal bleeding and rectal pain.  Genitourinary: Negative.        Objective:   Physical Exam  Constitutional: She is oriented to person, place, and time. She appears well-developed and well-nourished. No distress.  Neck: No thyromegaly present.  Cardiovascular: Normal rate, regular rhythm, normal heart sounds and intact distal pulses.   Pulmonary/Chest: Effort normal and breath sounds normal.  Abdominal: Soft. Bowel sounds are normal. She exhibits no distension and no mass. There is no tenderness. There is no rebound and no guarding.  Lymphadenopathy:    She has no cervical adenopathy.  Neurological: She is alert and oriented to person, place, and time.          Assessment & Plan:  Diarrhea, probably functional but we will order labs including a C diff toxin. Drink fluids and use Imodium prn

## 2014-12-18 LAB — CLOSTRIDIUM DIFFICILE BY PCR: CDIFFPCR: NOT DETECTED

## 2014-12-21 LAB — STOOL CULTURE

## 2014-12-30 ENCOUNTER — Other Ambulatory Visit: Payer: Self-pay | Admitting: *Deleted

## 2014-12-31 ENCOUNTER — Telehealth: Payer: Self-pay | Admitting: Oncology

## 2014-12-31 NOTE — Telephone Encounter (Signed)
Called and left a message with a new follow up per pof °

## 2015-01-20 ENCOUNTER — Other Ambulatory Visit: Payer: Self-pay | Admitting: *Deleted

## 2015-01-20 ENCOUNTER — Ambulatory Visit: Payer: PPO | Admitting: Nurse Practitioner

## 2015-01-20 ENCOUNTER — Telehealth: Payer: Self-pay | Admitting: Nurse Practitioner

## 2015-01-20 NOTE — Telephone Encounter (Signed)
Patient called in to reschedule her appointment due to weather °

## 2015-01-27 ENCOUNTER — Telehealth: Payer: Self-pay | Admitting: Oncology

## 2015-01-27 ENCOUNTER — Ambulatory Visit (HOSPITAL_BASED_OUTPATIENT_CLINIC_OR_DEPARTMENT_OTHER): Payer: PPO | Admitting: Nurse Practitioner

## 2015-01-27 ENCOUNTER — Encounter: Payer: Self-pay | Admitting: Nurse Practitioner

## 2015-01-27 VITALS — BP 128/56 | HR 65 | Temp 98.1°F | Resp 18 | Wt 211.9 lb

## 2015-01-27 DIAGNOSIS — Z170 Estrogen receptor positive status [ER+]: Secondary | ICD-10-CM | POA: Diagnosis not present

## 2015-01-27 DIAGNOSIS — R110 Nausea: Secondary | ICD-10-CM

## 2015-01-27 DIAGNOSIS — C50911 Malignant neoplasm of unspecified site of right female breast: Secondary | ICD-10-CM

## 2015-01-27 DIAGNOSIS — R29898 Other symptoms and signs involving the musculoskeletal system: Secondary | ICD-10-CM

## 2015-01-27 DIAGNOSIS — C50211 Malignant neoplasm of upper-inner quadrant of right female breast: Secondary | ICD-10-CM

## 2015-01-27 MED ORDER — ONDANSETRON HCL 8 MG PO TABS: 8.0000 mg | ORAL_TABLET | Freq: Three times a day (TID) | ORAL | Status: DC | PRN

## 2015-01-27 NOTE — Progress Notes (Signed)
ID: [name]   DOB: [birth year]  MR#: [id number]  ZTI#:[id number]  PCP: [name], MD GYN: Dian Queen, MD SU: Autumn Messing, MD OTHER MD:  Minus Breeding, MD;  Nena Polio, MD; Thea Silversmith, MD;  Lucio Edward, MD  CHIEF COMPLAINT:  Right Breast Cancer  CURRENT TREATMENT: Anastrozole  BREAST CANCER HISTORY: From the original intake note:  Siniya had routine screening mammography at [facility] 05/06/2011, showing an area of focal asymmetry in the lower inner quadrant of the right breast. Additional views 05/10/2011 confirmed a 9 mm area of irregularity without associated microcalcifications. Ultrasound of the right breast confirmed a 9 mm hypoechoic mass with posterior acoustic shadowing. Biopsy of this mass was performed the next day, and showed ([id number]) and invasive lobular carcinoma, E-cadherin negative, which was estrogen receptor positive at 97%, and progesterone receptor positive at 81%. There was no HER-2 amplification, and MIB-1 was 12%.  On may 07/31/2011 him of the patient underwent breast MRI, which showed a 1.5 cm mildly irregular enhancing mass in the upper inner quadrant of the right breast. There were no other masses of concern and no abnormal appearing lymph nodes. Accordingly, on 06/16/2011 the patient underwent right lumpectomy and sentinel lymph node sampling under Dr. Star Age, the final pathology (SZ [id number]) showing an invasive lobular carcinoma, grade 1, measuring 1.5 cm, with both sentinel lymph nodes negative.   The patient's subsequent history is as detailed below.  INTERVAL HISTORY: Drena returns today for follow up of her breast cancer, accompanied by her daughter. Unfortunately in the interval history, she had to stop the anastrozole. In October 2016, about 1 month after her follow up visit at this clinic, she developed poison ivy. She has very severe reactions to this and required a high dose of prednisone. She developed severe muscle weakness and was  diagnosed with a "steroid overdose" because of it. Since this time she is still very weak, especially to her legs. She ambulates with a cane and uses tramadol PRN for pain. She has moderate hand pain and ankle stiffness. She is nauseous daily, but does not vomit. She is chronically fatigued.   REVIEW OF SYSTEMS:  A detailed review of systems is otherwise stable, except where noted above.   PAST MEDICAL HISTORY: Past Medical History  Diagnosis Date  . Chronic venous insufficiency   . PVD (peripheral vascular disease) ([place])     mild carotid 11/05  . Osteoarthritis     bilat hips, severe  . Obesity   . CONTACT DERMATITIS   . GLUCOSE INTOLERANCE   . VITAMIN D DEFICIENCY   . Edema     pt  states had edema of feet and legs has been on lasix per dr. to help  . CAROTID STENOSIS   . DUPUYTREN'S CONTRACTURE   . MENOPAUSAL SYNDROME   . HLD (hyperlipidemia)   . Hypothyroidism     Dr. Ala Dach in [place]   . Breast cancer ([place])     R    PAST SURGICAL HISTORY: Past Surgical History  Procedure Laterality Date  . 2d echo  11/29/03    normal LV size. normal EF   . Normal caronary arteries      per pt. 2003  . Cholecystectomy    . Inguinal hernia repair    . Tonsillectomy    . Right rotator cuff surgery    . Total hip arthroplasty  05/2008 and 10/2008    B hip - alusio  . Hernia repair  incisional hernia  . Breast biopsy  05/11/11    right breast  . Breast lumpectomy      right breast  . Back surgery  1983    lumbarectomy  . Finger surgery    . Joint replacement Bilateral     Dr. Maureen Ralphs    FAMILY HISTORY Family History  Problem Relation Age of Onset  . Coronary artery disease      family hx - female 1st degree relative <60  . Hypothyroidism      aunt   . Uterine cancer Mother 26  . Cancer Mother   . Breast cancer Daughter 18    BRCA negative (no BART)  . Cancer Daughter   . Pancreatic cancer Maternal Grandfather     diagnosed in his 32s  . Anesthesia  problems Neg Hx   . Prostate cancer Maternal Uncle     diagnosed in his 17s  . Colon cancer Cousin   . Prostate cancer Paternal Uncle     diagnosed in his 34s  . Clotting disorder Maternal Grandmother   . Uterine cancer Cousin   . Leukemia Cousin   . Prostate cancer Cousin   . Heart disease Father     before age 5  . Varicose Veins Father   . Heart disease Brother     before age 80  . Cancer Sister    the patient's father died at the age of 49 from heart disease. The patient's mother died in 11 at the age of 70. She had uterine cancer in her 43s. There is a significant cancer family on her side, to be detailed when the patient meets with her genetics counselor. The patient herself has no sisters, does have 3 brothers. One of the patient's daughters, [name], was diagnosed with breast cancer at the age of 100. She was tested for the BRCA gene and was negative. There is no other breast or ovarian cancer in the immediate family to her knowledge.  GYNECOLOGIC HISTORY: Menarche age 81, menopause age 89. The patient tried Prempro briefly but did not like it. She is GX P5, first live birth age 87.  SOCIAL HISTORY:   (Updated December 2014) Khady worked in [employer], currently is developing a [employer]. She is divorced and lives by herself. 3 of her 5 children live in town ([place], [place], and [place]; [name] is also followed here w a history of breast cancer) and 2 live in [place] and [place]). She has 7 grandchildren. She attends the [employer].   ADVANCED DIRECTIVES: Not in place  HEALTH MAINTENANCE:  (Updated December 2014) Social History  Substance Use Topics  . Smoking status: Never Smoker   . Smokeless tobacco: Never Used  . Alcohol Use: No     Colonoscopy: 2009/[facility]  PAP: Sept 2014/[facility]  [facility], Normal  Lipid panel: Sept 2014/ [facility]    Allergies  Allergen Reactions  . Shellfish Allergy Anaphylaxis and  Hives    Hives all over the body.  . Percocet [Oxycodone-Acetaminophen] Hives  . Iodinated Diagnostic Agents     PT IS NOT AWARE OF IODINE ALLERGY, PREMEDICATED FOR PRIOR PREMEDS ONLY//A.C.  . Iodine Hives  . Prednisone     nausea    Current Outpatient Prescriptions  Medication Sig Dispense Refill  . levothyroxine (SYNTHROID, LEVOTHROID) 137 MCG tablet Take 137 mcg by mouth daily.     . Nutritional Supplements (JUICE PLUS FIBRE) LIQD Take 4 capsules by mouth  3 (three) times daily. 4 vegetable capsules with breakfast, 4 fruit capsules with lunch and 4 berry capsules with dinner.    Marland Kitchen anastrozole (ARIMIDEX) 1 MG tablet Take 1 tablet (1 mg total) by mouth daily. (Patient not taking: Reported on 10/23/2014) 90 tablet 3  . cholecalciferol (VITAMIN D) 1000 UNITS tablet Take 2,000 Units by mouth daily. Reported on 01/27/2015    . Cyanocobalamin (B-12 PO) Take 1 tablet by mouth daily. Reported on 01/27/2015    . Nutritional Supplements (REPLETE PO) Take 1 tablet by mouth daily. Reported on 01/27/2015    . ondansetron (ZOFRAN) 8 MG tablet Take 1 tablet (8 mg total) by mouth every 8 (eight) hours as needed for nausea or vomiting. 30 tablet 3   No current facility-administered medications for this visit.    OBJECTIVE: Middle-aged white woman who appears stated age 51 Vitals:   01/27/15 0942  BP: 128/56  Pulse: 65  Temp: 98.1 F (36.7 C)  Resp: 18     Body mass index is 32.23 kg/(m^2).    ECOG FS: 2 Filed Weights   01/27/15 0942  Weight: 211 lb 14.4 oz (96.117 kg)   Skin: warm, dry  HEENT: sclerae anicteric, conjunctivae pink, oropharynx clear. No thrush or mucositis.  Lymph Nodes: No cervical or supraclavicular lymphadenopathy  Lungs: clear to auscultation bilaterally, no rales, wheezes, or rhonci  Heart: regular rate and rhythm  Abdomen: round, soft, non tender, positive bowel sounds  Musculoskeletal: No focal spinal tenderness, no peripheral edema  Neuro: non focal, well oriented,  positive affect  Breasts: right breast status post lumpectomy. No evidence of recurrent disease. Right axilla benign. Left breast unremarkable.   LAB RESULTS: Lab Results  Component Value Date   WBC 20.8 Repeated and verified X2.* 11/05/2014   NEUTROABS 17.6* 11/05/2014   HGB 13.5 11/05/2014   HCT 41.1 11/05/2014   MCV 87.7 11/05/2014   PLT 278.0 11/05/2014      Chemistry      Component Value Date/Time   NA 142 10/01/2014 1329   NA 141 09/09/2014 1532   K 4.2 10/01/2014 1329   K 3.9 09/09/2014 1532   CL 105 09/09/2014 1532   CL 104 06/20/2012 1421   CO2 28 10/01/2014 1329   CO2 30 09/09/2014 1532   BUN 21.6 10/01/2014 1329   BUN 28* 09/09/2014 1532   CREATININE 0.8 10/01/2014 1329   CREATININE 0.82 09/09/2014 1532      Component Value Date/Time   CALCIUM 9.1 10/01/2014 1329   CALCIUM 9.8 09/09/2014 1532   ALKPHOS 94 10/01/2014 1329   ALKPHOS 74 09/09/2014 1532   AST 17 10/01/2014 1329   AST 16 09/09/2014 1532   ALT 12 10/01/2014 1329   ALT 11 09/09/2014 1532   BILITOT 0.69 10/01/2014 1329   BILITOT 0.8 09/09/2014 1532       STUDIES: No results found.  ASSESSMENT: 82 y.o. [place] woman   (1)  s/p Right lumpectomy and sentinel lymph node sampling 06/16/2011 for a T1c N0, stage IA invasive lobular breast cancer, grade 1, strongly estrogen and progesterone positive, with no HER-2 amplification, and an Mib-1 of 12%  (2) the patient met with radiation oncology 07/14/2011, but opted against adjuvant radiation  (3) started anastrozole July 2013, briefly discontinued because of side effects but resumed October 2013. Stopped October 2016.  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 other genes tested were normal and included APC, ATM, BARD1, BMPR1A, BRCA1, BRCA2, BRIP1,  CDH1, CHEK2, EPCAM, MLH1, MRE11A, MSH6, MUTYH, NBN, PALB2, PMS2, PTEN, RAD50, RAD51C, SMAD4, STK11 and TP53  PLAN:  Gianella is still recovering from  the damaging effects of prednisone that she was given last year. She is hesitant to restart anastrozole at this time, and I am inclined to agree with her. She needs to work herself back towards her baseline. This will be expedited by intentional exercise to recondition herself. I offered physical therapy and she declined. She has exercise bands at home that she intends to start using and she is making a goal of returning to water aerobics twice weekly. She understands that even basic exercises such as going from a seated to standing position a few times in a row could help strengthen her quadriceps.   I have written for zofran to use twice daily PRN for her nausea. Hopefully she will be able to make strides with her oral intake, particularly with protein to aid in muscle building.   Wrigley will return in 3 months for follow up. If she has improved we will discuss reinstating antiestrogen therapy. She understands and agrees with this plan. She knows the goal of treatment in her case is cure. She has been encouraged to call with any issues that might arise before her next visit here.    Laurie Panda, NP  01/27/2015

## 2015-01-27 NOTE — Telephone Encounter (Signed)
Appointments made and avs printed for patient °

## 2015-01-30 ENCOUNTER — Telehealth: Payer: Self-pay | Admitting: *Deleted

## 2015-01-30 ENCOUNTER — Encounter: Payer: Self-pay | Admitting: Nurse Practitioner

## 2015-01-30 ENCOUNTER — Other Ambulatory Visit: Payer: Self-pay | Admitting: *Deleted

## 2015-01-30 DIAGNOSIS — C50211 Malignant neoplasm of upper-inner quadrant of right female breast: Secondary | ICD-10-CM

## 2015-01-30 DIAGNOSIS — M6281 Muscle weakness (generalized): Secondary | ICD-10-CM

## 2015-01-30 NOTE — Progress Notes (Signed)
Addendum to prior call not completed.  Rachael [name] is requesting referral for PT.  This RN will send referral per documentation by HB/NP.

## 2015-01-30 NOTE — Progress Notes (Signed)
I faxed prior auth req for ondansetron to [facility]

## 2015-01-30 NOTE — Addendum Note (Signed)
Addended by: Laureen Abrahams on: 01/30/2015 02:51 PM   Modules accepted: Orders

## 2015-01-30 NOTE — Telephone Encounter (Signed)
Call received in [place] from pt wanting to follow up with HB/NP per visit 01/27/2015.  Rachael [name] states nausea is improved with use of antinausea pill.  " but I think I do need to see PT ",  " I have been doing the exercises as discussed with Heather but I am not feeling

## 2015-01-31 ENCOUNTER — Encounter: Payer: Self-pay | Admitting: Nurse Practitioner

## 2015-01-31 NOTE — Progress Notes (Signed)
I placed form from [facility] for heather's signature

## 2015-02-05 ENCOUNTER — Encounter: Payer: Self-pay | Admitting: Skilled Nursing Facility1

## 2015-02-05 ENCOUNTER — Ambulatory Visit: Payer: PPO | Attending: Family Medicine | Admitting: Physical Therapy

## 2015-02-05 DIAGNOSIS — R2689 Other abnormalities of gait and mobility: Secondary | ICD-10-CM

## 2015-02-05 DIAGNOSIS — R269 Unspecified abnormalities of gait and mobility: Secondary | ICD-10-CM | POA: Insufficient documentation

## 2015-02-05 DIAGNOSIS — R29898 Other symptoms and signs involving the musculoskeletal system: Secondary | ICD-10-CM

## 2015-02-05 NOTE — Therapy (Signed)
[place], [place], [address] Phone: [phone]   Fax:  [phone]  Physical Therapy Evaluation  Patient Details  Name: Rachael [name] MRN: [id number] Date of Birth: 28-Aug-1933 Referring Provider: Gentry Fitz, NP  Encounter Date: 02/05/2015      PT End of Session - 02/05/15 1224    Visit Number 1   Number of Visits 9   Date for PT Re-Evaluation 03/08/15   PT Start Time 1010   PT Stop Time 1058   PT Time Calculation (min) 48 min   Activity Tolerance Patient tolerated treatment well   Behavior During Therapy [facility] for tasks assessed/performed      Past Medical History  Diagnosis Date  . Chronic venous insufficiency   . PVD (peripheral vascular disease) ([place])     mild carotid 11/05  . Osteoarthritis     bilat hips, severe  . Obesity   . CONTACT DERMATITIS   . GLUCOSE INTOLERANCE   . VITAMIN D DEFICIENCY   . Edema     pt  states had edema of feet and legs has been on lasix per dr. to help  . CAROTID STENOSIS   . DUPUYTREN'S CONTRACTURE   . MENOPAUSAL SYNDROME   . HLD (hyperlipidemia)   . Hypothyroidism     Dr. Ala Dach in [place]   . Breast cancer ([place])     R    Past Surgical History  Procedure Laterality Date  . 2d echo  11/29/03    normal LV size. normal EF   . Normal caronary arteries      per pt. 2003  . Cholecystectomy    . Inguinal hernia repair    . Tonsillectomy    . Right rotator cuff surgery    . Total hip arthroplasty  05/2008 and 10/2008    B hip - alusio  . Hernia repair      incisional hernia  . Breast biopsy  05/11/11    right breast  . Breast lumpectomy      right breast  . Back surgery  1983    lumbarectomy  . Finger surgery    . Joint replacement Bilateral     Dr. Maureen Ralphs    There were no vitals filed for this visit.  Visit Diagnosis:  Weakness of both legs - Plan: PT plan of care cert/re-cert  Gait abnormality - Plan: PT plan of care  cert/re-cert  Unstable balance - Plan: PT plan of care cert/re-cert      Subjective Assessment - 02/05/15 1016    Subjective Had a bad case of poison ivy and was given prednisone for that; later ankles swelled up and was given prednisone again.  Got sick to her stomach and tapered off it.  Had nausea for two months and it affected her eating plus her legs got weaker.  Was exercising with Theraband and squats.  Saw NP at [facility] who said she thinks the steroid has affected her muscle mass.  Has lost 37 pounds since November. Has lost 64 lbs. total.  Sometimes feels like below her knees, her legs aren't there; can't feel the tops of her feet --felt better after a recent pedicure.   Pertinent History Treated for two issues with steroids and developed anorexia and leg weakness.  Breast cancer 06/15/2012 with lumpectomy on right with sentinel node biopsy (negative); no chemo nor radiation.  Was on anastrozole but is now off for a while due to current problems.  Hypothyroid.  Was hospitalized for an infection last February after cutting herself with a cowboy boot.  Both THAs in 2010.  Back discectomy in 1983  and did well with that.   Patient Stated Goals that I can walk normal again and get my strength back; I just want to feel good again.   Currently in Pain? No/denies            [facility] PT Assessment - 02/05/15 0001    Assessment   Medical Diagnosis leg weakness post steroid intake; h/o breast cancer   Referring Provider Gentry Fitz, NP   Precautions   Precautions Fall;Other (comment)   Precaution Comments cancer precautions   Restrictions   Weight Bearing Restrictions No   Balance Screen   Has the patient fallen in the past 6 months No   Has the patient had a decrease in activity level because of a fear of falling?  No   Is the patient reluctant to leave their home because of a fear of falling?  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   Vocation Requirements does need to be mobile; some standing; no heavy lifting   Leisure line dances once a week, water aerobics 4x/week, but not the last month and a half; does 15 squats 4 x/day and resisted hip abduction against tubing 15 reps 4x/day  has been afraid she couldn't get out of the pool   Cognition   Overall Cognitive Status Within Functional Limits for tasks assessed   Observation/Other Assessments   Observations wearing knee-high compression stockings   Functional Tests   Functional tests Sit to Stand   Sit to Stand   Comments 9 times in 30 seconds   ROM / Strength   AROM / PROM / Strength AROM;Strength   AROM   Overall AROM Comments both LEs grossly WFL except hip rotation limited bilaterally (pt. doesn't want to move into rotation due to hip precautions)   Strength   Strength Assessment Site Hip;Knee;Ankle   Right/Left Hip Right;Left   Right Hip Flexion 4+/5   Right Hip Extension 3/5   Right Hip ABduction 3-/5   Left Hip Flexion 4/5   Left Hip Extension 3/5   Left Hip ABduction 4-/5   Right/Left Knee Right;Left   Right Knee Flexion 4-/5   Right Knee Extension 4+/5   Left Knee Flexion 4/5   Left Knee Extension 4+/5   Right/Left Ankle Right;Left   Right Ankle Dorsiflexion 5/5   Right Ankle Inversion 5/5   Right Ankle Eversion 5/5   Left Ankle Dorsiflexion 5/5   Left Ankle Inversion 5/5   Left Ankle Eversion 5/5   Ambulation/Gait   Ambulation/Gait Yes   Ambulation/Gait Assistance 6: Modified independent (Device/Increase time)   Assistive device Straight cane;Rollator  hold furniture at home; using cane today   Gait Comments slow, tentative, and appears unsteady   Balance   Balance Assessed Yes   Dynamic Standing Balance   Dynamic Standing -  Comments reaches forward 8 inches in standing   Standardized Balance Assessment   Standardized Balance Assessment Timed Up and Go Test   Timed Up and Go Test   Normal TUG (seconds) 14  [facility] Goals - 09-Feb-2015 1238    CC Long Term Goal  #1   Title Independent with more advanced HEP for LE strengthening, to improve balance and steadiness in gait.   Baseline currently doing squats and hip abduction strengthening with tubing   Time 4   Period Weeks   Status New   CC Long Term Goal  #2   Title will feel safe going back to get into the pool (will feel confident that she can get back out of the pool)   Baseline doesn't feel able right now   Time 4   Period Weeks   Status New   CC Long Term Goal  #3   Title Time Up and Go will improve to <12 seconds without cane   Baseline 14 seconds on eval   Time 4   Period Weeks   Status New   CC Long Term Goal  #4   Title will feel confident with community ambulation using a cane or no assistive device   Baseline using rollator walker for some   Time 4   Period Weeks   Status New            Plan - Feb 09, 2015 1224    Clinical Impression Statement This is a very pleasant woman who was formerly very active with line dancing and water aerobics as well as sometimes strengthening, belonging to [employer].  Her exercise routine and her mobility have been impaired apparently by taking steroids for a couple of medical conditions.  She currently walks with a cane some of the time, a rollator walker sometimes, and without assistive device at times.  She has bilateral lower extremity weakness; she also has some intermittent right leg pain.  She scored 14 seconds on Timed Up and Go (below cutoff for safe gait).  Both her Timed Up and Go and 30 second sit to/from stand results are just below normal for her age.  Her LE AROM is grossly WFL except in hip rotation, probably limited by h/o total  hip replacements bilaterally.  She wears compression stockings for chronic venous insufficiency.  She will benefit from LE strengthening, balance  and gait training toward safer and more normal mobility.   Pt will benefit from skilled therapeutic intervention in order to improve on the following deficits Decreased strength;Decreased balance;Abnormal gait;Impaired perceived functional ability   Rehab Potential Good   Clinical Impairments Affecting Rehab Potential none   PT Frequency 2x / week   PT Duration 4 weeks   PT Treatment/Interventions Therapeutic exercise;Patient/family education;Functional mobility training;Gait training;Balance training;ADLs/Self Care Home Management   PT Next Visit Plan Begin ther ex for LE strengthening; adjust, add to home exercise program; consider Berg balance test and/or 6 minute walk test; check stair climbing   Consulted and Agree with Plan of Care Patient          G-Codes - 02/09/15 1241    Functional Assessment Tool Used clinical judgement   Functional Limitation Mobility: Walking and moving around   Mobility: Walking and Moving Around Current Status [id number]) At least 20 percent but less than 40 percent impaired, limited or restricted   Mobility: Walking and Moving Around Goal Status [id number]) At least 1 percent but less than 20 percent impaired, limited or restricted       Problem List Patient Active Problem List   Diagnosis Date Noted  . Gout attack 10/23/2014  . Toe effusion 05/24/2014  . Anemia in neoplastic disease 04/02/2014  . CKD (  chronic kidney disease), stage III 04/02/2014  . Wound infection ([place]) 03/18/2014  . Cellulitis 03/18/2014  . Acute renal failure ([place]) 03/18/2014  . Leg edema, right   . Open wound of leg 02/22/2014  . Breast cancer, right breast ([place]) 05/13/2011  . Incisional hernia, incarcerated, right upper quadrant 07/13/2010  . HEADACHE 11/06/2009  . CONTACT DERMATITIS 06/13/2009  . FATIGUE 06/13/2009  . EDEMA 05/01/2008   . BACK PAIN 03/29/2008  . CAROTID STENOSIS 03/16/2008  . ANXIETY 11/14/2007  . MENOPAUSAL SYNDROME 09/27/2007  . GLUCOSE INTOLERANCE 08/10/2007  . HYPERLIPIDEMIA 08/10/2007  . PERIPHERAL VASCULAR DISEASE 08/10/2007  . Venous (peripheral) insufficiency 08/10/2007  . Vitamin D deficiency 12/26/2006  . OBESITY 12/26/2006  . DUPUYTREN'S CONTRACTURE 12/26/2006  . Hypothyroidism 11/05/2006  . OSTEOARTHRITIS 11/05/2006    [place]mare 02/05/2015, 12:44 PM  [place], [place], [address] Phone: [phone]   Fax:  [phone]  Name: Rachael [name] MRN: [id number] Date of Birth: [birth year]   Serafina Royals, PT 02/05/2015 12:44 PM

## 2015-02-05 NOTE — Progress Notes (Signed)
Subjective:     Patient ID: Rachael [name], female   DOB: 09/24/1933, 82 y.o.   MRN: [id number]  HPI   Review of Systems     Objective:   Physical Exam To assist the pt in identifying some dietary strategies to gain lost wt.    Assessment:     Pt identified as being malnourished due to lost wt. Pt contacted via the telephone at [phone]. Pt states she is stable and drinks a protein shake every day (brand of shake is unknown). Pt states she is taking juice plus vitamins so she is perfectly fine. Pt states she eats a salad every day and tries to eat salmon. Pt states the wt she lost was intentional.    Plan:     No plan identified at this time.

## 2015-02-06 ENCOUNTER — Encounter: Payer: Self-pay | Admitting: Nurse Practitioner

## 2015-02-06 NOTE — Progress Notes (Signed)
Per envision ondansetron is covered under her part d plan. An override has been set in place so that her pharamcy will process it under part d plan only. [phone] for question. I sent to medical records

## 2015-02-11 ENCOUNTER — Ambulatory Visit: Payer: PPO | Admitting: Physical Therapy

## 2015-02-11 DIAGNOSIS — R269 Unspecified abnormalities of gait and mobility: Secondary | ICD-10-CM

## 2015-02-11 DIAGNOSIS — R29898 Other symptoms and signs involving the musculoskeletal system: Secondary | ICD-10-CM

## 2015-02-11 NOTE — Therapy (Signed)
[place], [place], [address] Phone: [phone]   Fax:  [phone]  Physical Therapy Treatment  Patient Details  Name: Rachael [name] MRN: [id number] Date of Birth: Mar 11, 1933 Referring Provider: Gentry Fitz, NP  Encounter Date: 02/11/2015      PT End of Session - 02/11/15 1741    Visit Number 2   PT Start Time 1430   PT Stop Time 1516   PT Time Calculation (min) 46 min   Activity Tolerance Patient tolerated treatment well   Behavior During Therapy [facility] for tasks assessed/performed      Past Medical History  Diagnosis Date  . Chronic venous insufficiency   . PVD (peripheral vascular disease) ([place])     mild carotid 11/05  . Osteoarthritis     bilat hips, severe  . Obesity   . CONTACT DERMATITIS   . GLUCOSE INTOLERANCE   . VITAMIN D DEFICIENCY   . Edema     pt  states had edema of feet and legs has been on lasix per dr. to help  . CAROTID STENOSIS   . DUPUYTREN'S CONTRACTURE   . MENOPAUSAL SYNDROME   . HLD (hyperlipidemia)   . Hypothyroidism     Dr. Ala Dach in [place]   . Breast cancer ([place])     R    Past Surgical History  Procedure Laterality Date  . 2d echo  11/29/03    normal LV size. normal EF   . Normal caronary arteries      per pt. 2003  . Cholecystectomy    . Inguinal hernia repair    . Tonsillectomy    . Right rotator cuff surgery    . Total hip arthroplasty  05/2008 and 10/2008    B hip - alusio  . Hernia repair      incisional hernia  . Breast biopsy  05/11/11    right breast  . Breast lumpectomy      right breast  . Back surgery  1983    lumbarectomy  . Finger surgery    . Joint replacement Bilateral     Dr. Maureen Ralphs    There were no vitals filed for this visit.  Visit Diagnosis:  Weakness of both legs  Gait abnormality      Subjective Assessment - 02/11/15 1430    Subjective "I'm feeling better and I've started eating more and I think that  helped.  Has added exercises at home; feels she's doing a lot better.  Went out on Sunday, Monday, and today.   Currently in Pain? Yes   Pain Score 4    Pain Location Leg   Pain Orientation Right   Pain Descriptors / Indicators Throbbing   Aggravating Factors  constant   Pain Relieving Factors Aleve might help            [facility] PT Assessment - 02/11/15 0001    Ambulation/Gait   Stairs Yes   Stairs Assistance 6: Modified independent (Device/Increase time)   Stair Management Technique One rail Right;Alternating pattern;With cane  can do without cane with rail, but not without rail   Number of Stairs 4                     [facility] Adult PT Treatment/Exercise - 02/11/15 0001    Knee/Hip Exercises: Aerobic   Nustep L6 x 7 minutes (+1.5 mins.) at 60-70 steps/minute, arms and legs  HR 90 bpm with mild dyspnea after that  Knee/Hip Exercises: Machines for Strengthening   Cybex Knee Extension 45 lbs. x 12 with both legs   Cybex Knee Flexion 60 lbs. x 10 with both legs   Cybex Leg Press sled at #8; 3 plates (60 lbs.) x 12 x 2; then 80 lbs. (4 plates) x 12  no problem   Hip Cybex for abduction, 12.5 lbs. x 10 each side, then 25 lbs. (2 plates) x 10 each side; for flexion 12.5 lbs. x 10 each side   moderate dyspnea after doing these leg exercises                        [facility] Goals - Mar 03, 2015 1744    CC Long Term Goal  #1   Title Independent with more advanced HEP for LE strengthening, to improve balance and steadiness in gait.   Status Achieved   CC Long Term Goal  #2   Title will feel safe going back to get into the pool (will feel confident that she can get back out of the pool)   Status Deferred   CC Long Term Goal  #3   Title Time Up and Go will improve to <12 seconds without cane   Status Unable to assess   CC Long Term Goal  #4   Title will feel confident with community ambulation using a cane or no assistive device   Status Unable to  assess            Plan - 2015-03-03 1741    Clinical Impression Statement Patient came in today reporting she is already feeling better and noticing improved function (mobility, endurance, exercise tolerance).  She was able to perform a number of exercises today such as she might do at her gym.  She became moderately dyspneic with some and noted some fatigue, but appropriately so.  She would be able to continue this type of workout at her gym; she has been a member and worked out there for some time and is motivated to continue.  We discussed this and agreed on discharge from therapy for her to continue at the gym.   Pt will benefit from skilled therapeutic intervention in order to improve on the following deficits Decreased strength;Decreased balance;Abnormal gait;Impaired perceived functional ability   Rehab Potential Good   PT Treatment/Interventions Therapeutic exercise;Patient/family education   PT Next Visit Plan None; discharge today and patient to continue working out at the gym.   PT Home Exercise Plan continue what she has been doing; return to gym workouts   Consulted and Agree with Plan of Care Patient          G-Codes - 03/03/15 1745    Functional Assessment Tool Used clinical judgement   Functional Limitation Mobility: Walking and moving around   Mobility: Walking and Moving Around Goal Status [phone]) At least 1 percent but less than 20 percent impaired, limited or restricted   Mobility: Walking and Moving Around Discharge Status [phone]) At least 20 percent but less than 40 percent impaired, limited or restricted      Problem List Patient Active Problem List   Diagnosis Date Noted  . Gout attack 10/23/2014  . Toe effusion 05/24/2014  . Anemia in neoplastic disease 04/02/2014  . CKD (chronic kidney disease), stage III 04/02/2014  . Wound infection ([place]) 03/18/2014  . Cellulitis 03/18/2014  . Acute renal failure ([place]) 03/18/2014  . Leg edema, right   . Open wound of  leg 02/22/2014  .  Breast cancer, right breast ([place]) 05/13/2011  . Incisional hernia, incarcerated, right upper quadrant 07/13/2010  . HEADACHE 11/06/2009  . CONTACT DERMATITIS 06/13/2009  . FATIGUE 06/13/2009  . EDEMA 05/01/2008  . BACK PAIN 03/29/2008  . CAROTID STENOSIS 03/16/2008  . ANXIETY 11/14/2007  . MENOPAUSAL SYNDROME 09/27/2007  . GLUCOSE INTOLERANCE 08/10/2007  . HYPERLIPIDEMIA 08/10/2007  . PERIPHERAL VASCULAR DISEASE 08/10/2007  . Venous (peripheral) insufficiency 08/10/2007  . Vitamin D deficiency 12/26/2006  . OBESITY 12/26/2006  . DUPUYTREN'S CONTRACTURE 12/26/2006  . Hypothyroidism 11/05/2006  . OSTEOARTHRITIS 11/05/2006    SALISBUR[place],DONNA 02/11/2015, 5:47 PM  [place], [place], [address] Phone: [phone]   Fax:  [phone]  Name: Rachael [name] MRN: [id number] Date of Birth: 1933-04-21    PHYSICAL THERAPY DISCHARGE SUMMARY  Visits from Start of Care: 2  Current functional level related to goals / functional outcomes: Goals partially met as noted above.   Remaining deficits: Still with LE weakness and need of an assistive device for gait, but she is ready to return to her gym to use the equipment there to strengthen and improve on these deficits.   Education / Equipment: Exercise instruction.  Plan: Patient agrees to discharge.  Patient goals were partially met. Patient is being discharged due to being pleased with the current functional level.  ?????       Serafina Royals, PT 02/11/2015 5:47 PM

## 2015-02-18 ENCOUNTER — Encounter: Payer: PPO | Admitting: Physical Therapy

## 2015-02-19 ENCOUNTER — Encounter: Payer: Self-pay | Admitting: Gastroenterology

## 2015-02-24 ENCOUNTER — Encounter: Payer: PPO | Admitting: Physical Therapy

## 2015-02-26 ENCOUNTER — Encounter: Payer: PPO | Admitting: Physical Therapy

## 2015-03-05 DIAGNOSIS — E559 Vitamin D deficiency, unspecified: Secondary | ICD-10-CM | POA: Diagnosis not present

## 2015-03-05 DIAGNOSIS — C50819 Malignant neoplasm of overlapping sites of unspecified female breast: Secondary | ICD-10-CM | POA: Diagnosis not present

## 2015-03-05 DIAGNOSIS — E039 Hypothyroidism, unspecified: Secondary | ICD-10-CM | POA: Diagnosis not present

## 2015-03-05 DIAGNOSIS — E063 Autoimmune thyroiditis: Secondary | ICD-10-CM | POA: Diagnosis not present

## 2015-03-05 DIAGNOSIS — G729 Myopathy, unspecified: Secondary | ICD-10-CM | POA: Diagnosis not present

## 2015-03-05 DIAGNOSIS — R7309 Other abnormal glucose: Secondary | ICD-10-CM | POA: Diagnosis not present

## 2015-03-13 ENCOUNTER — Telehealth: Payer: Self-pay | Admitting: *Deleted

## 2015-03-13 NOTE — Telephone Encounter (Signed)
Message left by pt stating she obtained PT per prior referral post visit with HB/NP with improvement.  Zahra states " I then had another relapse last week and was seen by my endrocrine MD " " he suggested I get PT but receive it at home because I cannot get out of the house "  " I was wanting Nira Conn to order home PT for me "  Return call number given as [phone].  This RN will follow up with MD and return call to pt.

## 2015-03-14 ENCOUNTER — Telehealth: Payer: Self-pay | Admitting: *Deleted

## 2015-03-14 NOTE — Telephone Encounter (Signed)
This RN post message this AM from pt's daughter- Herbert Spires- returned call regarding request for home PT.  This RN informed Herbert Spires who was with mom that due to recurrence of symptoms now more severe- referral is best managed by MD assessing current issues. This RN discussed possible need for evaluation by primary MD due to pt stating symptoms occurred post use of steroid pack prescribed by him.  Tori states " I don't think mom wants to return to him "  Per discussion this RN esplained per routine visit for history of breast cancer and problem discussed with our provider PT was ordered more as a courtsey- but symptoms need to be addressed further by appropriate MD.  This RN did ask if pt could contact Dr Ubaldo Glassing for referral since he assessed pt recently with concerns.  Herbert Spires states she is unsure if he would prescribe home PT.  This RN informed Tori call from this office will be made for best communication of concerns.  Tori verbalized understanding.  This RN contacted pt's endocrinologist - Dr Lemar Lofty who is in [place] at [phone] and spoke with Colletta Maryland- med tech for MD.  Per discussion she will give concerns to Dr Lemar Lofty including possible need for communication to primary MD- Dr Alysia Penna.  This RN's name and return call number given as well.

## 2015-03-17 ENCOUNTER — Inpatient Hospital Stay (HOSPITAL_COMMUNITY)
Admission: EM | Admit: 2015-03-17 | Discharge: 2015-03-20 | DRG: 546 | Disposition: A | Discharge status: Skilled Nursing Facility | Payer: PPO | Attending: Internal Medicine | Admitting: Internal Medicine

## 2015-03-17 ENCOUNTER — Encounter (HOSPITAL_COMMUNITY): Payer: Self-pay | Admitting: Emergency Medicine

## 2015-03-17 ENCOUNTER — Emergency Department (HOSPITAL_COMMUNITY): Payer: PPO

## 2015-03-17 DIAGNOSIS — E669 Obesity, unspecified: Secondary | ICD-10-CM | POA: Diagnosis present

## 2015-03-17 DIAGNOSIS — R29898 Other symptoms and signs involving the musculoskeletal system: Secondary | ICD-10-CM | POA: Diagnosis not present

## 2015-03-17 DIAGNOSIS — Z91041 Radiographic dye allergy status: Secondary | ICD-10-CM

## 2015-03-17 DIAGNOSIS — Z79899 Other long term (current) drug therapy: Secondary | ICD-10-CM

## 2015-03-17 DIAGNOSIS — Z8249 Family history of ischemic heart disease and other diseases of the circulatory system: Secondary | ICD-10-CM

## 2015-03-17 DIAGNOSIS — Z885 Allergy status to narcotic agent status: Secondary | ICD-10-CM | POA: Diagnosis not present

## 2015-03-17 DIAGNOSIS — Z9049 Acquired absence of other specified parts of digestive tract: Secondary | ICD-10-CM | POA: Diagnosis not present

## 2015-03-17 DIAGNOSIS — Z853 Personal history of malignant neoplasm of breast: Secondary | ICD-10-CM

## 2015-03-17 DIAGNOSIS — M1990 Unspecified osteoarthritis, unspecified site: Secondary | ICD-10-CM | POA: Insufficient documentation

## 2015-03-17 DIAGNOSIS — M50322 Other cervical disc degeneration at C5-C6 level: Secondary | ICD-10-CM | POA: Diagnosis not present

## 2015-03-17 DIAGNOSIS — I739 Peripheral vascular disease, unspecified: Secondary | ICD-10-CM | POA: Diagnosis not present

## 2015-03-17 DIAGNOSIS — M109 Gout, unspecified: Secondary | ICD-10-CM | POA: Diagnosis present

## 2015-03-17 DIAGNOSIS — M25532 Pain in left wrist: Secondary | ICD-10-CM | POA: Diagnosis present

## 2015-03-17 DIAGNOSIS — Z96649 Presence of unspecified artificial hip joint: Secondary | ICD-10-CM | POA: Diagnosis not present

## 2015-03-17 DIAGNOSIS — Z888 Allergy status to other drugs, medicaments and biological substances status: Secondary | ICD-10-CM | POA: Diagnosis not present

## 2015-03-17 DIAGNOSIS — D638 Anemia in other chronic diseases classified elsewhere: Secondary | ICD-10-CM | POA: Diagnosis present

## 2015-03-17 DIAGNOSIS — M50321 Other cervical disc degeneration at C4-C5 level: Secondary | ICD-10-CM | POA: Diagnosis not present

## 2015-03-17 DIAGNOSIS — R2681 Unsteadiness on feet: Secondary | ICD-10-CM | POA: Diagnosis not present

## 2015-03-17 DIAGNOSIS — Z96643 Presence of artificial hip joint, bilateral: Secondary | ICD-10-CM | POA: Diagnosis present

## 2015-03-17 DIAGNOSIS — A419 Sepsis, unspecified organism: Secondary | ICD-10-CM

## 2015-03-17 DIAGNOSIS — M069 Rheumatoid arthritis, unspecified: Secondary | ICD-10-CM | POA: Diagnosis not present

## 2015-03-17 DIAGNOSIS — M25531 Pain in right wrist: Secondary | ICD-10-CM | POA: Diagnosis not present

## 2015-03-17 DIAGNOSIS — D7589 Other specified diseases of blood and blood-forming organs: Secondary | ICD-10-CM | POA: Diagnosis not present

## 2015-03-17 DIAGNOSIS — M6281 Muscle weakness (generalized): Secondary | ICD-10-CM | POA: Diagnosis not present

## 2015-03-17 DIAGNOSIS — D649 Anemia, unspecified: Secondary | ICD-10-CM | POA: Diagnosis not present

## 2015-03-17 DIAGNOSIS — G720 Drug-induced myopathy: Secondary | ICD-10-CM | POA: Diagnosis not present

## 2015-03-17 DIAGNOSIS — Z806 Family history of leukemia: Secondary | ICD-10-CM

## 2015-03-17 DIAGNOSIS — R634 Abnormal weight loss: Secondary | ICD-10-CM | POA: Diagnosis not present

## 2015-03-17 DIAGNOSIS — E7439 Other disorders of intestinal carbohydrate absorption: Secondary | ICD-10-CM | POA: Diagnosis present

## 2015-03-17 DIAGNOSIS — E559 Vitamin D deficiency, unspecified: Secondary | ICD-10-CM | POA: Diagnosis present

## 2015-03-17 DIAGNOSIS — Z6831 Body mass index (BMI) 31.0-31.9, adult: Secondary | ICD-10-CM | POA: Diagnosis not present

## 2015-03-17 DIAGNOSIS — I872 Venous insufficiency (chronic) (peripheral): Secondary | ICD-10-CM | POA: Diagnosis present

## 2015-03-17 DIAGNOSIS — E039 Hypothyroidism, unspecified: Secondary | ICD-10-CM | POA: Diagnosis present

## 2015-03-17 DIAGNOSIS — R531 Weakness: Secondary | ICD-10-CM | POA: Diagnosis not present

## 2015-03-17 DIAGNOSIS — M259 Joint disorder, unspecified: Secondary | ICD-10-CM | POA: Diagnosis not present

## 2015-03-17 DIAGNOSIS — R278 Other lack of coordination: Secondary | ICD-10-CM | POA: Diagnosis not present

## 2015-03-17 DIAGNOSIS — Z800 Family history of malignant neoplasm of digestive organs: Secondary | ICD-10-CM

## 2015-03-17 DIAGNOSIS — R509 Fever, unspecified: Secondary | ICD-10-CM | POA: Diagnosis not present

## 2015-03-17 DIAGNOSIS — Z91013 Allergy to seafood: Secondary | ICD-10-CM | POA: Diagnosis not present

## 2015-03-17 DIAGNOSIS — E785 Hyperlipidemia, unspecified: Secondary | ICD-10-CM | POA: Diagnosis present

## 2015-03-17 DIAGNOSIS — Z803 Family history of malignant neoplasm of breast: Secondary | ICD-10-CM

## 2015-03-17 DIAGNOSIS — M720 Palmar fascial fibromatosis [Dupuytren]: Secondary | ICD-10-CM | POA: Diagnosis not present

## 2015-03-17 DIAGNOSIS — M353 Polymyalgia rheumatica: Principal | ICD-10-CM | POA: Diagnosis present

## 2015-03-17 DIAGNOSIS — N390 Urinary tract infection, site not specified: Secondary | ICD-10-CM | POA: Diagnosis not present

## 2015-03-17 DIAGNOSIS — R6510 Systemic inflammatory response syndrome (SIRS) of non-infectious origin without acute organ dysfunction: Secondary | ICD-10-CM | POA: Diagnosis present

## 2015-03-17 DIAGNOSIS — T380X5A Adverse effect of glucocorticoids and synthetic analogues, initial encounter: Secondary | ICD-10-CM | POA: Diagnosis not present

## 2015-03-17 DIAGNOSIS — I517 Cardiomegaly: Secondary | ICD-10-CM | POA: Diagnosis not present

## 2015-03-17 DIAGNOSIS — Z8049 Family history of malignant neoplasm of other genital organs: Secondary | ICD-10-CM

## 2015-03-17 DIAGNOSIS — J90 Pleural effusion, not elsewhere classified: Secondary | ICD-10-CM | POA: Diagnosis not present

## 2015-03-17 LAB — CBC WITH DIFFERENTIAL/PLATELET
BASOS PCT: 0 %
Basophils Absolute: 0 10*3/uL (ref 0.0–0.1)
Eosinophils Absolute: 0 10*3/uL (ref 0.0–0.7)
Eosinophils Relative: 0 %
HEMATOCRIT: 29.4 % — AB (ref 36.0–46.0)
HEMOGLOBIN: 9.4 g/dL — AB (ref 12.0–15.0)
LYMPHS PCT: 9 %
Lymphs Abs: 1.4 10*3/uL (ref 0.7–4.0)
MCH: 25 pg — ABNORMAL LOW (ref 26.0–34.0)
MCHC: 32 g/dL (ref 30.0–36.0)
MCV: 78.2 fL (ref 78.0–100.0)
MONOS PCT: 13 %
Monocytes Absolute: 2 10*3/uL — ABNORMAL HIGH (ref 0.1–1.0)
NEUTROS ABS: 11.4 10*3/uL — AB (ref 1.7–7.7)
NEUTROS PCT: 78 %
Platelets: 552 10*3/uL — ABNORMAL HIGH (ref 150–400)
RBC: 3.76 MIL/uL — ABNORMAL LOW (ref 3.87–5.11)
RDW: 15.9 % — ABNORMAL HIGH (ref 11.5–15.5)
WBC: 14.8 10*3/uL — ABNORMAL HIGH (ref 4.0–10.5)

## 2015-03-17 LAB — COMPREHENSIVE METABOLIC PANEL
ALBUMIN: 2.6 g/dL — AB (ref 3.5–5.0)
ALK PHOS: 65 U/L (ref 38–126)
ALT: 11 U/L — AB (ref 14–54)
ANION GAP: 10 (ref 5–15)
AST: 16 U/L (ref 15–41)
BILIRUBIN TOTAL: 0.8 mg/dL (ref 0.3–1.2)
BUN: 15 mg/dL (ref 6–20)
CALCIUM: 9.2 mg/dL (ref 8.9–10.3)
CO2: 25 mmol/L (ref 22–32)
CREATININE: 0.77 mg/dL (ref 0.44–1.00)
Chloride: 99 mmol/L — ABNORMAL LOW (ref 101–111)
GFR calc non Af Amer: >60 mL/min (ref >60)
GLUCOSE: 120 mg/dL — AB (ref 65–99)
Potassium: 3.8 mmol/L (ref 3.5–5.1)
SODIUM: 134 mmol/L — AB (ref 135–145)
TOTAL PROTEIN: 6.3 g/dL — AB (ref 6.5–8.1)

## 2015-03-17 LAB — I-STAT CG4 LACTIC ACID, ED: Lactic Acid, Venous: 0.68 mmol/L (ref 0.5–2.0)

## 2015-03-17 MED ORDER — SODIUM CHLORIDE 0.9 % IV BOLUS (SEPSIS)
1000.0000 mL | Freq: Once | INTRAVENOUS | Status: AC
  Administered 2015-03-18: 1000 mL via INTRAVENOUS

## 2015-03-17 MED ORDER — FENTANYL CITRATE (PF) 100 MCG/2ML IJ SOLN
50.0000 ug | Freq: Once | INTRAMUSCULAR | Status: AC
  Administered 2015-03-17: 50 ug via NASAL
  Filled 2015-03-17: qty 2

## 2015-03-17 MED ORDER — SODIUM CHLORIDE 0.9 % IV SOLN
INTRAVENOUS | Status: DC
  Administered 2015-03-18 – 2015-03-20 (×2): via INTRAVENOUS

## 2015-03-17 MED ORDER — ACETAMINOPHEN 325 MG PO TABS
650.0000 mg | ORAL_TABLET | Freq: Once | ORAL | Status: AC | PRN
  Administered 2015-03-17: 650 mg via ORAL
  Filled 2015-03-17: qty 2

## 2015-03-17 NOTE — ED Notes (Signed)
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 Patient states she was poisoned with prednisone. Since then has been having increased joint pain, affecting ability to walk. Denies numbness or tingling, grip is weak. Pain with movement of limbs.

## 2015-03-17 NOTE — ED Provider Notes (Signed)
CSN: [id number]     Arrival date & time 03/17/15  1816 History  By signing my name below, I, Eustaquio Maize, attest that this documentation has been prepared under the direction and in the presence of Carmin Muskrat, MD. Electronically Signed: Eustaquio Maize, ED Scribe. 03/17/2015. 11:17 PM.   Chief Complaint  Patient presents with  . Joint Pain   The history is provided by the patient. No language interpreter was used.     HPI Comments: Rachael [name] is a 82 y.o. female who presents to the Emergency Department complaining of gradual onset, constant, burning, joint pain x 1 week. Pt also complains of generalized weakness, right leg swelling, and hand swelling for the past week. She came to the ED today due to her weakness being worse today. Pt states that her son had to carry her everywhere today due to her being so weak and unable to move by herself. Family states that for the past 4 months pt has lost 60 pounds due to having a loss of appetite. Pt mentions that she was poisoned with Prednisone in October 2016 after having an exposure to poison ivy and has been having issues since. She saw her endocrinologist 3 weeks ago and was diagnosed with steroid myopathy. The endocrinologist suggest physical therapy which pt was getting at the [facility] for the past 2 weeks. Denies any other associated symptoms.   Past Medical History  Diagnosis Date  . Chronic venous insufficiency   . PVD (peripheral vascular disease) ([place])     mild carotid 11/05  . Osteoarthritis     bilat hips, severe  . Obesity   . CONTACT DERMATITIS   . GLUCOSE INTOLERANCE   . VITAMIN D DEFICIENCY   . Edema     pt  states had edema of feet and legs has been on lasix per dr. to help  . CAROTID STENOSIS   . DUPUYTREN'S CONTRACTURE   . MENOPAUSAL SYNDROME   . HLD (hyperlipidemia)   . Hypothyroidism     Dr. Ala Dach in [place]   . Breast cancer ([place])     R   Past Surgical History  Procedure Laterality Date  .  2d echo  11/29/03    normal LV size. normal EF   . Normal caronary arteries      per pt. 2003  . Cholecystectomy    . Inguinal hernia repair    . Tonsillectomy    . Right rotator cuff surgery    . Total hip arthroplasty  05/2008 and 10/2008    B hip - alusio  . Hernia repair      incisional hernia  . Breast biopsy  05/11/11    right breast  . Breast lumpectomy      right breast  . Back surgery  1983    lumbarectomy  . Finger surgery    . Joint replacement Bilateral     Dr. Maureen Ralphs   Family History  Problem Relation Age of Onset  . Coronary artery disease      family hx - female 1st degree relative <60  . Hypothyroidism      aunt   . Uterine cancer Mother 75  . Cancer Mother   . Breast cancer Daughter 37    BRCA negative (no BART)  . Cancer Daughter   . Pancreatic cancer Maternal Grandfather     diagnosed in his 42s  . Anesthesia problems Neg Hx   . Prostate cancer Maternal Uncle  diagnosed in his 33s  . Colon cancer Cousin   . Prostate cancer Paternal Uncle     diagnosed in his 28s  . Clotting disorder Maternal Grandmother   . Uterine cancer Cousin   . Leukemia Cousin   . Prostate cancer Cousin   . Heart disease Father     before age 21  . Varicose Veins Father   . Heart disease Brother     before age 60  . Cancer Sister    Social History  Substance Use Topics  . Smoking status: Never Smoker   . Smokeless tobacco: Never Used  . Alcohol Use: No   OB History    No data available     Review of Systems  Constitutional: Positive for appetite change.  HENT: Negative.   Eyes: Negative for visual disturbance.  Respiratory: Negative for shortness of breath.   Cardiovascular: Negative for chest pain.  Gastrointestinal: Positive for nausea. Negative for vomiting and diarrhea.  Endocrine:       Oliguria   Genitourinary: Positive for dysuria.  Musculoskeletal: Positive for joint swelling and arthralgias.  Skin: Negative.   Allergic/Immunologic: Negative  for immunocompromised state.  Neurological: Positive for weakness (generalized).  Psychiatric/Behavioral: Positive for dysphoric mood and decreased concentration.   Allergies  Shellfish allergy; Percocet; Iodinated diagnostic agents; Iodine; and Prednisone  Home Medications   Prior to Admission medications   Medication Sig Start Date End Date Taking? Authorizing Provider  cholecalciferol (VITAMIN D) 1000 UNITS tablet Take 2,000 Units by mouth daily. Reported on 01/27/2015   Yes Historical Provider, MD  Cyanocobalamin (B-12 PO) Take 1 tablet by mouth daily. Reported on 01/27/2015   Yes Historical Provider, MD  levothyroxine (SYNTHROID, LEVOTHROID) 137 MCG tablet Take 137 mcg by mouth daily.    Yes Historical Provider, MD  Nutritional Supplements (JUICE PLUS FIBRE) LIQD Take 4 capsules by mouth 3 (three) times daily. 4 vegetable capsules with breakfast, 4 fruit capsules with lunch and 4 berry capsules with dinner.   Yes Historical Provider, MD  Nutritional Supplements (REPLETE PO) Take 1 tablet by mouth daily. Reported on 02/05/2015   Yes Historical Provider, MD  anastrozole (ARIMIDEX) 1 MG tablet Take 1 tablet (1 mg total) by mouth daily. Patient not taking: Reported on 10/23/2014 03/12/14   Chauncey Cruel, MD  ondansetron (ZOFRAN) 8 MG tablet Take 1 tablet (8 mg total) by mouth every 8 (eight) hours as needed for nausea or vomiting. Patient not taking: Reported on 03/17/2015 01/27/15   Genelle Gather Boelter, NP   BP 112/55 mmHg  Pulse 66  Temp(Src) 101.4 F (38.6 C) (Oral)  Resp 18  SpO2 95%   Physical Exam  Constitutional: She is oriented to person, place, and time. She has a sickly appearance.  HENT:  Head: Normocephalic and atraumatic.  Eyes: Conjunctivae are normal. Right eye exhibits no discharge. Left eye exhibits no discharge.  Neck: No tracheal deviation present.  Cardiovascular: Normal rate and regular rhythm.   Pulmonary/Chest: No respiratory distress. She has no wheezes.   Abdominal: She exhibits no distension. There is no tenderness.  Musculoskeletal: She exhibits no edema.  Neurological: She is alert and oriented to person, place, and time. She displays atrophy.  Skin: There is pallor.  Psychiatric: She has a normal mood and affect.    ED Course  Procedures (including critical care time)  DIAGNOSTIC STUDIES: Oxygen Saturation is 95% on RA, normal by my interpretation.    COORDINATION OF CARE: 11:16 PM-Discussed treatment plan which includes CBC,  CMP, UA, Urine culture, Lactic acid, and blood culture with pt at bedside and pt agreed to plan.   Labs Review Labs Reviewed  CBC WITH DIFFERENTIAL/PLATELET - Abnormal; Notable for the following:    WBC 14.8 (*)    RBC 3.76 (*)    Hemoglobin 9.4 (*)    HCT 29.4 (*)    MCH 25.0 (*)    RDW 15.9 (*)    Platelets 552 (*)    Neutro Abs 11.4 (*)    Monocytes Absolute 2.0 (*)    All other components within normal limits  COMPREHENSIVE METABOLIC PANEL - Abnormal; Notable for the following:    Sodium 134 (*)    Chloride 99 (*)    Glucose, Bld 120 (*)    Total Protein 6.3 (*)    Albumin 2.6 (*)    ALT 11 (*)    All other components within normal limits  URINE CULTURE  CULTURE, BLOOD (ROUTINE X 2)  CULTURE, BLOOD (ROUTINE X 2)  URINALYSIS, ROUTINE W REFLEX MICROSCOPIC (NOT AT [facility])  TSH  T4, FREE  CK  C-REACTIVE PROTEIN  I-STAT CG4 LACTIC ACID, ED    Imaging Review Dg Chest 2 View  03/18/2015  CLINICAL DATA:  Progressive weakness over several weeks, weight loss. EXAM: CHEST  2 VIEW COMPARISON:  Radiographs 08/07/2010 FINDINGS: Mild cardiomegaly, new from prior exam. Suspect small left pleural effusion with basilar atelectasis. No pulmonary edema. No consolidation to suggest pneumonia. No pneumothorax. Chronic change about the right shoulder. Age related degenerative change in the spine. Surgical clips in the right axilla. IMPRESSION: Mild cardiomegaly, new from 2012. Small left pleural effusion with  adjacent atelectasis. Electronically Signed   By: Jeb Levering M.D.   On: 03/18/2015 00:03   I have personally reviewed and evaluated these lab results as part of my medical decision-making.  4:12 AM Patient sleeping, seemingly comfortably. Blood pressure is slightly lower than several hours ago, but the patient has been supine, immobile 4 hours.  Patient has received Tylenol, 2 L fluid resuscitation, ceftriaxone.   MDM   I personally performed the services described in this documentation, which was scribed in my presence. The recorded information has been reviewed and is accurate.    Elderly female presents with concern of acute on chronic weakness, generalized discomfort. Per the patient professes belief that steroid myopathy is causative etiology, patient's evaluation tonight demonstrates presence of urinary tract infection. Patient is mentating appropriately, but is found to have fever, leukocytosis, both concerning, requiring IV antibiotics, fluid resuscitation, admission for further evaluation and management.  Carmin Muskrat, MD 03/18/15 [phone]

## 2015-03-17 NOTE — ED Notes (Signed)
MD at bedside. 

## 2015-03-18 ENCOUNTER — Inpatient Hospital Stay (HOSPITAL_COMMUNITY): Payer: PPO

## 2015-03-18 DIAGNOSIS — R29898 Other symptoms and signs involving the musculoskeletal system: Secondary | ICD-10-CM | POA: Diagnosis not present

## 2015-03-18 DIAGNOSIS — T380X5A Adverse effect of glucocorticoids and synthetic analogues, initial encounter: Secondary | ICD-10-CM | POA: Diagnosis not present

## 2015-03-18 DIAGNOSIS — Z888 Allergy status to other drugs, medicaments and biological substances status: Secondary | ICD-10-CM | POA: Diagnosis not present

## 2015-03-18 DIAGNOSIS — Z8249 Family history of ischemic heart disease and other diseases of the circulatory system: Secondary | ICD-10-CM | POA: Diagnosis not present

## 2015-03-18 DIAGNOSIS — R2681 Unsteadiness on feet: Secondary | ICD-10-CM | POA: Diagnosis not present

## 2015-03-18 DIAGNOSIS — M1990 Unspecified osteoarthritis, unspecified site: Secondary | ICD-10-CM | POA: Diagnosis not present

## 2015-03-18 DIAGNOSIS — E785 Hyperlipidemia, unspecified: Secondary | ICD-10-CM | POA: Diagnosis not present

## 2015-03-18 DIAGNOSIS — I517 Cardiomegaly: Secondary | ICD-10-CM | POA: Diagnosis not present

## 2015-03-18 DIAGNOSIS — I872 Venous insufficiency (chronic) (peripheral): Secondary | ICD-10-CM | POA: Diagnosis present

## 2015-03-18 DIAGNOSIS — G720 Drug-induced myopathy: Secondary | ICD-10-CM | POA: Diagnosis not present

## 2015-03-18 DIAGNOSIS — D7589 Other specified diseases of blood and blood-forming organs: Secondary | ICD-10-CM | POA: Diagnosis not present

## 2015-03-18 DIAGNOSIS — Z79899 Other long term (current) drug therapy: Secondary | ICD-10-CM | POA: Diagnosis not present

## 2015-03-18 DIAGNOSIS — M25531 Pain in right wrist: Secondary | ICD-10-CM | POA: Diagnosis not present

## 2015-03-18 DIAGNOSIS — E559 Vitamin D deficiency, unspecified: Secondary | ICD-10-CM | POA: Diagnosis present

## 2015-03-18 DIAGNOSIS — Z853 Personal history of malignant neoplasm of breast: Secondary | ICD-10-CM | POA: Diagnosis not present

## 2015-03-18 DIAGNOSIS — A419 Sepsis, unspecified organism: Secondary | ICD-10-CM | POA: Diagnosis not present

## 2015-03-18 DIAGNOSIS — Z803 Family history of malignant neoplasm of breast: Secondary | ICD-10-CM | POA: Diagnosis not present

## 2015-03-18 DIAGNOSIS — N390 Urinary tract infection, site not specified: Secondary | ICD-10-CM | POA: Diagnosis not present

## 2015-03-18 DIAGNOSIS — E039 Hypothyroidism, unspecified: Secondary | ICD-10-CM | POA: Diagnosis not present

## 2015-03-18 DIAGNOSIS — Z91041 Radiographic dye allergy status: Secondary | ICD-10-CM | POA: Diagnosis not present

## 2015-03-18 DIAGNOSIS — M50321 Other cervical disc degeneration at C4-C5 level: Secondary | ICD-10-CM | POA: Diagnosis not present

## 2015-03-18 DIAGNOSIS — Z9049 Acquired absence of other specified parts of digestive tract: Secondary | ICD-10-CM | POA: Diagnosis not present

## 2015-03-18 DIAGNOSIS — Z885 Allergy status to narcotic agent status: Secondary | ICD-10-CM | POA: Diagnosis not present

## 2015-03-18 DIAGNOSIS — J90 Pleural effusion, not elsewhere classified: Secondary | ICD-10-CM | POA: Diagnosis not present

## 2015-03-18 DIAGNOSIS — Z800 Family history of malignant neoplasm of digestive organs: Secondary | ICD-10-CM | POA: Diagnosis not present

## 2015-03-18 DIAGNOSIS — R531 Weakness: Secondary | ICD-10-CM | POA: Diagnosis not present

## 2015-03-18 DIAGNOSIS — M353 Polymyalgia rheumatica: Secondary | ICD-10-CM | POA: Diagnosis not present

## 2015-03-18 DIAGNOSIS — Z806 Family history of leukemia: Secondary | ICD-10-CM | POA: Diagnosis not present

## 2015-03-18 DIAGNOSIS — M069 Rheumatoid arthritis, unspecified: Secondary | ICD-10-CM | POA: Diagnosis not present

## 2015-03-18 DIAGNOSIS — D649 Anemia, unspecified: Secondary | ICD-10-CM | POA: Diagnosis not present

## 2015-03-18 DIAGNOSIS — M259 Joint disorder, unspecified: Secondary | ICD-10-CM | POA: Diagnosis not present

## 2015-03-18 DIAGNOSIS — Z91013 Allergy to seafood: Secondary | ICD-10-CM | POA: Diagnosis not present

## 2015-03-18 DIAGNOSIS — M50322 Other cervical disc degeneration at C5-C6 level: Secondary | ICD-10-CM | POA: Diagnosis not present

## 2015-03-18 DIAGNOSIS — D638 Anemia in other chronic diseases classified elsewhere: Secondary | ICD-10-CM | POA: Diagnosis not present

## 2015-03-18 DIAGNOSIS — M25532 Pain in left wrist: Secondary | ICD-10-CM | POA: Diagnosis present

## 2015-03-18 DIAGNOSIS — Z96649 Presence of unspecified artificial hip joint: Secondary | ICD-10-CM | POA: Diagnosis present

## 2015-03-18 DIAGNOSIS — E669 Obesity, unspecified: Secondary | ICD-10-CM | POA: Diagnosis not present

## 2015-03-18 DIAGNOSIS — M109 Gout, unspecified: Secondary | ICD-10-CM | POA: Diagnosis present

## 2015-03-18 DIAGNOSIS — M720 Palmar fascial fibromatosis [Dupuytren]: Secondary | ICD-10-CM | POA: Diagnosis not present

## 2015-03-18 DIAGNOSIS — R278 Other lack of coordination: Secondary | ICD-10-CM | POA: Diagnosis not present

## 2015-03-18 DIAGNOSIS — Z96643 Presence of artificial hip joint, bilateral: Secondary | ICD-10-CM | POA: Diagnosis present

## 2015-03-18 DIAGNOSIS — E7439 Other disorders of intestinal carbohydrate absorption: Secondary | ICD-10-CM | POA: Diagnosis present

## 2015-03-18 DIAGNOSIS — Z8049 Family history of malignant neoplasm of other genital organs: Secondary | ICD-10-CM | POA: Diagnosis not present

## 2015-03-18 DIAGNOSIS — I739 Peripheral vascular disease, unspecified: Secondary | ICD-10-CM | POA: Diagnosis not present

## 2015-03-18 DIAGNOSIS — Z6831 Body mass index (BMI) 31.0-31.9, adult: Secondary | ICD-10-CM | POA: Diagnosis not present

## 2015-03-18 DIAGNOSIS — R509 Fever, unspecified: Secondary | ICD-10-CM | POA: Diagnosis not present

## 2015-03-18 DIAGNOSIS — R634 Abnormal weight loss: Secondary | ICD-10-CM | POA: Diagnosis not present

## 2015-03-18 DIAGNOSIS — R6510 Systemic inflammatory response syndrome (SIRS) of non-infectious origin without acute organ dysfunction: Secondary | ICD-10-CM | POA: Diagnosis not present

## 2015-03-18 DIAGNOSIS — M6281 Muscle weakness (generalized): Secondary | ICD-10-CM | POA: Diagnosis not present

## 2015-03-18 LAB — URINALYSIS, ROUTINE W REFLEX MICROSCOPIC
BILIRUBIN URINE: NEGATIVE
Glucose, UA: NEGATIVE mg/dL
KETONES UR: NEGATIVE mg/dL
NITRITE: NEGATIVE
PROTEIN: NEGATIVE mg/dL
Specific Gravity, Urine: 1.007 (ref 1.005–1.030)
pH: 6.5 (ref 5.0–8.0)

## 2015-03-18 LAB — FOLATE: Folate: 17.3 ng/mL (ref >5.9)

## 2015-03-18 LAB — CK: Total CK: 18 U/L — ABNORMAL LOW (ref 38–234)

## 2015-03-18 LAB — TSH
TSH: 0.464 u[IU]/mL (ref 0.350–4.500)
TSH: 0.48 u[IU]/mL (ref 0.41–5.90)
TSH: 0.481 u[IU]/mL (ref 0.350–4.500)

## 2015-03-18 LAB — VITAMIN B12: VITAMIN B 12: 989 pg/mL — AB (ref 180–914)

## 2015-03-18 LAB — URINE MICROSCOPIC-ADD ON

## 2015-03-18 LAB — T4, FREE: FREE T4: 1.53 ng/dL — AB (ref 0.61–1.12)

## 2015-03-18 LAB — C-REACTIVE PROTEIN: CRP: 20.3 mg/dL — ABNORMAL HIGH (ref <1.0)

## 2015-03-18 MED ORDER — ENOXAPARIN SODIUM 40 MG/0.4ML ~~LOC~~ SOLN
40.0000 mg | SUBCUTANEOUS | Status: DC
  Administered 2015-03-18 – 2015-03-20 (×3): 40 mg via SUBCUTANEOUS
  Filled 2015-03-18 (×4): qty 0.4

## 2015-03-18 MED ORDER — TRAMADOL HCL 50 MG PO TABS
50.0000 mg | ORAL_TABLET | Freq: Four times a day (QID) | ORAL | Status: DC
  Administered 2015-03-18 – 2015-03-19 (×2): 50 mg via ORAL
  Filled 2015-03-18 (×5): qty 1

## 2015-03-18 MED ORDER — DIPHENHYDRAMINE HCL 50 MG PO CAPS
50.0000 mg | ORAL_CAPSULE | Freq: Once | ORAL | Status: AC
  Administered 2015-03-18: 50 mg via ORAL
  Filled 2015-03-18: qty 1

## 2015-03-18 MED ORDER — SODIUM CHLORIDE 0.9 % IV SOLN
INTRAVENOUS | Status: DC
  Administered 2015-03-18 (×2): via INTRAVENOUS
  Administered 2015-03-19: 1000 mL via INTRAVENOUS

## 2015-03-18 MED ORDER — IOHEXOL 300 MG/ML  SOLN: 50.0000 mL | Freq: Once | INTRAMUSCULAR | Status: DC | PRN

## 2015-03-18 MED ORDER — DEXTROSE 5 % IV SOLN
1.0000 g | Freq: Once | INTRAVENOUS | Status: AC
  Administered 2015-03-18: 1 g via INTRAVENOUS
  Filled 2015-03-18: qty 10

## 2015-03-18 MED ORDER — LEVOTHYROXINE SODIUM 137 MCG PO TABS
137.0000 ug | ORAL_TABLET | Freq: Every day | ORAL | Status: DC
  Administered 2015-03-19 – 2015-03-20 (×2): 137 ug via ORAL
  Filled 2015-03-18 (×5): qty 1

## 2015-03-18 MED ORDER — DEXTROSE 5 % IV SOLN
1.0000 g | INTRAVENOUS | Status: DC
  Administered 2015-03-19 – 2015-03-20 (×2): 1 g via INTRAVENOUS
  Filled 2015-03-18 (×2): qty 10

## 2015-03-18 MED ORDER — SODIUM CHLORIDE 0.9 % IV BOLUS (SEPSIS)
1000.0000 mL | Freq: Once | INTRAVENOUS | Status: AC
  Administered 2015-03-18: 1000 mL via INTRAVENOUS

## 2015-03-18 MED ORDER — ENSURE ENLIVE PO LIQD: 237.0000 mL | Freq: Two times a day (BID) | ORAL | Status: DC

## 2015-03-18 MED ORDER — ACETAMINOPHEN 325 MG PO TABS
650.0000 mg | ORAL_TABLET | Freq: Four times a day (QID) | ORAL | Status: DC | PRN
  Administered 2015-03-18: 650 mg via ORAL
  Filled 2015-03-18: qty 2

## 2015-03-18 NOTE — H&P (Signed)
[facility] History and Physical  JENAVI BEEDLE SWH:[id number] DOB: 04-Aug-1933 DOA: 03/17/2015  Referring physician: EDP PCP: [name], MD   Chief Complaint: Generalized weakness   HPI: Rachael [name] is a 82 y.o. female who presents to the ED with acute on chronic weakness.  She has been having generalized muscle weakness for several months due to steroid myopathy after being given prednisone for poison ivy late last year.  This has been followed by a rheumatologist and endocrinologist.  2 days ago however, the patient developed worsening generalized weakness to the point where son had to cary her around everywhere today.  Review of Systems: Systems reviewed.  As above, otherwise negative  Past Medical History  Diagnosis Date  . Chronic venous insufficiency   . PVD (peripheral vascular disease) ([place])     mild carotid 11/05  . Osteoarthritis     bilat hips, severe  . Obesity   . CONTACT DERMATITIS   . GLUCOSE INTOLERANCE   . VITAMIN D DEFICIENCY   . Edema     pt  states had edema of feet and legs has been on lasix per dr. to help  . CAROTID STENOSIS   . DUPUYTREN'S CONTRACTURE   . MENOPAUSAL SYNDROME   . HLD (hyperlipidemia)   . Hypothyroidism     Dr. Ala Dach in [place]   . Breast cancer ([place])     R   Past Surgical History  Procedure Laterality Date  . 2d echo  11/29/03    normal LV size. normal EF   . Normal caronary arteries      per pt. 2003  . Cholecystectomy    . Inguinal hernia repair    . Tonsillectomy    . Right rotator cuff surgery    . Total hip arthroplasty  05/2008 and 10/2008    B hip - alusio  . Hernia repair      incisional hernia  . Breast biopsy  05/11/11    right breast  . Breast lumpectomy      right breast  . Back surgery  1983    lumbarectomy  . Finger surgery    . Joint replacement Bilateral     Dr. Maureen Ralphs   Social History:  reports that she has never smoked. She has never used smokeless tobacco. She reports that she  does not drink alcohol or use illicit drugs.  Allergies  Allergen Reactions  . Shellfish Allergy Anaphylaxis and Hives    Hives all over the body.  . Percocet [Oxycodone-Acetaminophen] Hives  . Iodinated Diagnostic Agents     PT IS NOT AWARE OF IODINE ALLERGY, PREMEDICATED FOR PRIOR PREMEDS ONLY//A.C.  . Iodine Hives  . Prednisone     nausea    Family History  Problem Relation Age of Onset  . Coronary artery disease      family hx - female 1st degree relative <60  . Hypothyroidism      aunt   . Uterine cancer Mother 54  . Cancer Mother   . Breast cancer Daughter 68    BRCA negative (no BART)  . Cancer Daughter   . Pancreatic cancer Maternal Grandfather     diagnosed in his 34s  . Anesthesia problems Neg Hx   . Prostate cancer Maternal Uncle     diagnosed in his 74s  . Colon cancer Cousin   . Prostate cancer Paternal Uncle     diagnosed in his 69s  . Clotting disorder Maternal Grandmother   .  Uterine cancer Cousin   . Leukemia Cousin   . Prostate cancer Cousin   . Heart disease Father     before age 39  . Varicose Veins Father   . Heart disease Brother     before age 15  . Cancer Sister      Prior to Admission medications   Medication Sig Start Date End Date Taking? Authorizing Provider  cholecalciferol (VITAMIN D) 1000 UNITS tablet Take 2,000 Units by mouth daily. Reported on 01/27/2015   Yes Historical Provider, MD  Cyanocobalamin (B-12 PO) Take 1 tablet by mouth daily. Reported on 01/27/2015   Yes Historical Provider, MD  levothyroxine (SYNTHROID, LEVOTHROID) 137 MCG tablet Take 137 mcg by mouth daily.    Yes Historical Provider, MD  Nutritional Supplements (JUICE PLUS FIBRE) LIQD Take 4 capsules by mouth 3 (three) times daily. 4 vegetable capsules with breakfast, 4 fruit capsules with lunch and 4 berry capsules with dinner.   Yes Historical Provider, MD  Nutritional Supplements (REPLETE PO) Take 1 tablet by mouth daily. Reported on 02/05/2015   Yes Historical  Provider, MD   Physical Exam: Filed Vitals:   03/17/15 2112 03/18/15 0212  BP: 112/55 93/43  Pulse: 66 57  Temp: 101.4 F (38.6 C)   Resp: 18 16    BP 93/43 mmHg  Pulse 57  Temp(Src) 101.4 F (38.6 C) (Oral)  Resp 16  SpO2 93%  General Appearance:    Alert, oriented, no distress, appears stated age  Head:    Normocephalic, atraumatic  Eyes:    PERRL, EOMI, sclera non-icteric        Nose:   Nares without drainage or epistaxis. Mucosa, turbinates normal  Throat:   Moist mucous membranes. Oropharynx without erythema or exudate.  Neck:   Supple. No carotid bruits.  No thyromegaly.  No lymphadenopathy.   Back:     No CVA tenderness, no spinal tenderness  Lungs:     Clear to auscultation bilaterally, without wheezes, rhonchi or rales  Chest wall:    No tenderness to palpitation  Heart:    Regular rate and rhythm without murmurs, gallops, rubs  Abdomen:     Soft, non-tender, nondistended, normal bowel sounds, no organomegaly  Genitalia:    deferred  Rectal:    deferred  Extremities:   No clubbing, cyanosis or edema.  Pulses:   2+ and symmetric all extremities  Skin:   Skin color, texture, turgor normal, no rashes or lesions  Lymph nodes:   Cervical, supraclavicular, and axillary nodes normal  Neurologic:   CNII-XII intact. Normal strength, sensation and reflexes      throughout    Labs on Admission:  Basic Metabolic Panel:  Recent Labs Lab 03/17/15 2302  NA 134*  K 3.8  CL 99*  CO2 25  GLUCOSE 120*  BUN 15  CREATININE 0.77  CALCIUM 9.2   Liver Function Tests:  Recent Labs Lab 03/17/15 2302  AST 16  ALT 11*  ALKPHOS 65  BILITOT 0.8  PROT 6.3*  ALBUMIN 2.6*   No results for input(s): LIPASE, AMYLASE in the last 168 hours. No results for input(s): AMMONIA in the last 168 hours. CBC:  Recent Labs Lab 03/17/15 2302  WBC 14.8*  NEUTROABS 11.4*  HGB 9.4*  HCT 29.4*  MCV 78.2  PLT 552*   Cardiac Enzymes:  Recent Labs Lab 03/18/15 0006  CKTOTAL  18*    BNP (last 3 results) No results for input(s): PROBNP in the last 8760 hours. CBG:  No results for input(s): GLUCAP in the last 168 hours.  Radiological Exams on Admission: Dg Chest 2 View  03/18/2015  CLINICAL DATA:  Progressive weakness over several weeks, weight loss. EXAM: CHEST  2 VIEW COMPARISON:  Radiographs 08/07/2010 FINDINGS: Mild cardiomegaly, new from prior exam. Suspect small left pleural effusion with basilar atelectasis. No pulmonary edema. No consolidation to suggest pneumonia. No pneumothorax. Chronic change about the right shoulder. Age related degenerative change in the spine. Surgical clips in the right axilla. IMPRESSION: Mild cardiomegaly, new from 2012. Small left pleural effusion with adjacent atelectasis. Electronically Signed   By: Jeb Levering M.D.   On: 03/18/2015 00:03    EKG: Independently reviewed.  Assessment/Plan Principal Problem:   Sepsis secondary to UTI ([place])   1. Sepsis secondary to UTI - 1. Tylenol PRN fever 2. IVF 3. Rocephin 4. Urine cultures pending 5. PT/OT ordered for her severe generalized weakness  Code Status: Full  Family Communication: No family in room Disposition Plan: Admit to inpatient   Time spent: 50 min  GARDNER, JARED M. [facility] Pager [phone]  If 7AM-7PM, please contact the day team taking care of the patient [email or web address] Password [facility] 03/18/2015, 4:41 AM

## 2015-03-18 NOTE — ED Notes (Signed)
Pt daughter called and informed that pt will be admitted and we are awaiting room assignment

## 2015-03-18 NOTE — Progress Notes (Addendum)
Patient Demographics  Rachael [name], is a 82 y.o. female, DOB - 08-10-1933, [id number]  Admit date - 03/17/2015   Admitting Physician Etta Quill, DO  Outpatient Primary MD for the patient is No PCP Per Patient  LOS - 0   Chief Complaint  Patient presents with  . Weakness       Admission HPI/Brief narrative: 82 year old female with past medical history of hypothyroidism, breast cancer S/P lumpectomy , hyperlipidemia, with recent diagnosis of chronic muscle weakness thought to be secondary to steroid-induced neuropathy, by endocrinology and rheumatology as an outpatient, patient presents with acute exacerbation of her weakness over the last 2 days, as well reports fever, in ED workup significant for UTI, and leukocytosis.  Subjective:   Rachael [name] today has, No headache, No chest pain, No abdominal pain - No Nausea, reports her generalized weakness did not change since admission.   Assessment & Plan    Principal Problem:   Sepsis secondary to UTI ([place])  Sepsis secondary to UTI: - Impression presents with fever, leukocytosis, workup significant for UTI, negative chest x-ray - Continue with IV Rocephin - Follow on urine cultures - Follow on blood cultures  Acute on chronic weakness - Patient with chronic weakness started last October status post steroid therapy for poison ivy - Been followed with rheumatology and endocrinology as an outpatient - Patient reports 40 pound weight loss over last few month, she has known history of breast cancer, in remission, paraneoplastic syndrome is a possibility, though we'll check CT chest abdomen pelvis to rule out recurrence. - We'll obtain screening rheumatology workup including aldolase, ANA, C3, C4 , and rheumatoid factor - We'll consult neurology.  Hypothyroidism - Continue with Synthroid  Code Status: Full  Family Communication: Discussed  with son at bedside  Disposition Plan: SNF per PT, discussed with family, they are agreeable to SNF discharge patient is ready for discharge   Procedures  none   Consults   neurology   Medications  Scheduled Meds: . [START ON 03/19/2015] cefTRIAXone (ROCEPHIN)  IV  1 g Intravenous Q24H  . enoxaparin (LOVENOX) injection  40 mg Subcutaneous Q24H  . feeding supplement (ENSURE ENLIVE)  237 mL Oral BID BM  . levothyroxine  137 mcg Oral QAC breakfast  . traMADol  50 mg Oral 4 times per day   Continuous Infusions: . sodium chloride Stopped (03/18/15 0349)  . sodium chloride 125 mL/hr at 03/18/15 0445   PRN Meds:.acetaminophen  DVT Prophylaxis  Lovenox  Lab Results  Component Value Date   PLT 552* 03/17/2015    Antibiotics    Anti-infectives    Start     Dose/Rate Route Frequency Ordered Stop   03/19/15 0345  cefTRIAXone (ROCEPHIN) 1 g in dextrose 5 % 50 mL IVPB     1 g 100 mL/hr over 30 Minutes Intravenous Every 24 hours 03/18/15 0406     03/18/15 0345  cefTRIAXone (ROCEPHIN) 1 g in dextrose 5 % 50 mL IVPB     1 g 100 mL/hr over 30 Minutes Intravenous  Once 03/18/15 0338 03/18/15 0446          Objective:   Filed Vitals:   03/18/15 0923 03/18/15 0924 03/18/15 1048 03/18/15 1355  BP: 115/61 115/61 127/78  Pulse: 60 53 62   Temp:  98.4 F (36.9 C) 98.6 F (37 C) 99.1 F (37.3 C)  TempSrc:  Oral Oral   Resp: 16 16 18    Height:   5' 7.5" (1.715 m)   Weight:   92.1 kg (203 lb 0.7 oz)   SpO2: 95% 97% 98%     Wt Readings from Last 3 Encounters:  03/18/15 92.1 kg (203 lb 0.7 oz)  01/27/15 96.117 kg (211 lb 14.4 oz)  12/13/14 97.523 kg (215 lb)     Intake/Output Summary (Last 24 hours) at 03/18/15 1848 Last data filed at 03/18/15 1355  Gross per 24 hour  Intake    300 ml  Output      0 ml  Net    300 ml     Physical Exam  Awake Alert, Oriented X 3,  Supple Neck,No JVD,   Symmetrical Chest wall movement, Good air movement bilaterally, CTAB RRR,No  Gallops,Rubs or new Murmurs, No Parasternal Heave +ve B.Sounds, Abd Soft, No tenderness,, No rebound - guarding or rigidity. No Cyanosis, Clubbing or edema, No new Rash or bruise , patient with significant muscle weakness and proximal muscle.   Data Review   Micro Results No results found for this or any previous visit (from the past 240 hour(s)).  Radiology Reports Dg Chest 2 View  03/18/2015  CLINICAL DATA:  Progressive weakness over several weeks, weight loss. EXAM: CHEST  2 VIEW COMPARISON:  Radiographs 08/07/2010 FINDINGS: Mild cardiomegaly, new from prior exam. Suspect small left pleural effusion with basilar atelectasis. No pulmonary edema. No consolidation to suggest pneumonia. No pneumothorax. Chronic change about the right shoulder. Age related degenerative change in the spine. Surgical clips in the right axilla. IMPRESSION: Mild cardiomegaly, new from 2012. Small left pleural effusion with adjacent atelectasis. Electronically Signed   By: Jeb Levering M.D.   On: 03/18/2015 00:03     CBC  Recent Labs Lab 03/17/15 2302  WBC 14.8*  HGB 9.4*  HCT 29.4*  PLT 552*  MCV 78.2  MCH 25.0*  MCHC 32.0  RDW 15.9*  LYMPHSABS 1.4  MONOABS 2.0*  EOSABS 0.0  BASOSABS 0.0    Chemistries   Recent Labs Lab 03/17/15 2302  NA 134*  K 3.8  CL 99*  CO2 25  GLUCOSE 120*  BUN 15  CREATININE 0.77  CALCIUM 9.2  AST 16  ALT 11*  ALKPHOS 65  BILITOT 0.8   ------------------------------------------------------------------------------------------------------------------ estimated creatinine clearance is 67.1 mL/min (by C-G formula based on Cr of 0.77). ------------------------------------------------------------------------------------------------------------------ No results for input(s): HGBA1C in the last 72 hours. ------------------------------------------------------------------------------------------------------------------ No results for input(s): CHOL, HDL, LDLCALC,  TRIG, CHOLHDL, LDLDIRECT in the last 72 hours. ------------------------------------------------------------------------------------------------------------------  Recent Labs  03/18/15 0005  TSH 0.464   ------------------------------------------------------------------------------------------------------------------ No results for input(s): VITAMINB12, FOLATE, FERRITIN, TIBC, IRON, RETICCTPCT in the last 72 hours.  Coagulation profile No results for input(s): INR, PROTIME in the last 168 hours.  No results for input(s): DDIMER in the last 72 hours.  Cardiac Enzymes No results for input(s): CKMB, TROPONINI, MYOGLOBIN in the last 168 hours.  Invalid input(s): CK ------------------------------------------------------------------------------------------------------------------ Invalid input(s): POCBNP     Time Spent in minutes   No charge   [facility], DAWOOD M.D on 03/18/2015 at 6:48 PM  Between 7am to 7pm - Pager - [phone]  After 7pm go to [email or web address] - password [facility]  [facility]   Office  [phone]

## 2015-03-18 NOTE — Evaluation (Signed)
Occupational Therapy Evaluation Patient Details Name: Rachael [name] MRN: [id number] DOB: 1933-05-05 Today's Date: 03/18/2015    History of Present Illness Rachael [name] is a 82 y.o. female who presents to the ED 03/17/15 with acute on chronic weakness. She has been having generalized muscle weakness for several months due to steroid myopathy after being given prednisone   Clinical Impression   Pt was admitted for the above. She has had a recent decline in adls over the past couple of weeks due to decrease strength arms and loss of ROM in hands bil. She will benefit from skilled OT to increase strength and make adaptations to maximize participation in adls. She will also benefit from continued OT at a snf.  Goals in acute are for min to mod A for UB adls and toilet transfers.    Follow Up Recommendations  SNF    Equipment Recommendations   (to be further assessed)    Recommendations for Other Services       Precautions / Restrictions Precautions Precautions: Fall Restrictions Weight Bearing Restrictions: No      Mobility Bed Mobility Overal bed mobility: Needs Assistance          General bed mobility comments: not performed during OT  Transfers Overall transfer level: Needs assistance           General transfer comment: not performed during OT    Balance                                            ADL Overall ADL's : Needs assistance/impaired                                       General ADL Comments: pt needs total A for UB ADLs and total +2 for LB adls.  Did not perform transfer at this time.  Brought built up foam and large universal cuff to try with RUE.  She will need positioning with R elbow to be able to reach mouth. Will try self feeding on next visit.  Toothbrush handle is large, so pt is unable to turn with teeth. She will need assistance with this.  Educated on edema management--positioned on pillows and pt is  performing AROM within her tolerance  Requested soft touch call bell     Vision     Perception     Praxis      Pertinent Vitals/Pain Pain Assessment: 0-10 Pain Score:  (5-8) Pain Location: bil hands/arms wtih movement Pain Descriptors / Indicators: Aching Pain Intervention(s): Limited activity within patient's tolerance;Monitored during session;Repositioned     Hand Dominance Right   Extremity/Trunk Assessment Upper Extremity Assessment Upper Extremity Assessment: Defer to OT evaluation;RUE deficits/detail;LUE deficits/detail RUE Deficits / Details: Able to tolerate 80 degrees AAROM FF but painful on way down; gravity eliminated shoulder rotation to neutral; AROM elbow 3/4 ROM, wrist 10 degrees extension, 20 degrees flexion; fingers approximately 30 degrees MCP flexion and able to laterally pinch LUE Deficits / Details: able to tolerate 30 degrees FF and neutral shoulder rotation AAROM; AROM, elbow 3/4 ROM, wrist 20 degrees extension and flexion, hand similar to R.          Communication Communication Communication: No difficulties   Cognition Arousal/Alertness: Awake/alert Behavior During Therapy: WFL for tasks assessed/performed Overall  Cognitive Status: Within Functional Limits for tasks assessed                     General Comments       Exercises       Shoulder Instructions      Home Living Family/patient expects to be discharged to:: Skilled nursing facility                                        Prior Functioning/Environment          Comments: had needed assistance for a couple of weeks and moved in with daughter    OT Diagnosis: Generalized weakness;Acute pain   OT Problem List: Decreased strength;Decreased range of motion;Decreased activity tolerance;Impaired UE functional use;Pain;Increased edema;Decreased knowledge of use of DME or AE (balance NT)   OT Treatment/Interventions: Self-care/ADL training;Therapeutic  exercise;DME and/or AE instruction;Therapeutic activities;Patient/family education (balance tba further)    OT Goals(Current goals can be found in the care plan section) Acute Rehab OT Goals Patient Stated Goal: to go to rehab OT Goal Formulation: With patient Time For Goal Achievement: 04/01/15 Potential to Achieve Goals: Good ADL Goals Pt Will Perform Eating: with min assist;with adaptive utensils;sitting;bed level (with built up foam or universal cuff) Pt Will Perform Grooming: with mod assist;with adaptive equipment;sitting;bed level (brushing teeth) Pt Will Transfer to Toilet: with min assist;bedside commode;stand pivot transfer Additional ADL Goal #1: pt will perform bed mobility with min A in preparation for adls Additional ADL Goal #2: pt will tolerate 5 reps x 2 sets shoulder AAROM throughout to increase strength in bil UES for adls  OT Frequency: Min 2X/week   Barriers to D/C:            Co-evaluation              End of Session    Activity Tolerance: Patient limited by pain Patient left: in bed;with call bell/phone within reach;with family/visitor present   Time: [id number] OT Time Calculation (min): 23 min Charges:  OT General Charges $OT Visit: 1 Procedure OT Evaluation $OT Eval Moderate Complexity: 1 Procedure G-Codes:    Rayna Brenner 03/25/2015, 4:55 PM  Lesle Chris, OTR/L [phone] 03-25-2015

## 2015-03-18 NOTE — Consult Note (Signed)
Neurology Consultation Reason for Consult: Weakness Referring Physician: Elgergawy, D  CC: Weakness  History is obtained from: Patient  HPI: Rachael [name] is a 82 y.o. female with a history of weakness since October when she was started on steroids for a few days for presumed gout flare. Following a few days of the steroids, she began to notice lower extremity weakness and the steroids were stopped. At the time, as her left wrist alone I am was swollen and warm and tender. She states that her legs became weak following this, and she has had problems with weakness ever since. He has also noticed that her legs seem numb bilaterally up to about 4 inches above her ankles.  She was using a walker for her and safety, but never lost the ability to walk. However the past week, things have gotten worse, most notably she feels that her hands and arms are involved. What she describes in her arms, however, is markedly different than what she is experiencing in her legs. While her legs are described as weakness, her hands are very painful to squeeze or move at the wrist. She also has pain at the elbows as well. When asked about neck pain, she does endorse new neck pain as well. She denies any changes in bowels or bladder   ROS: A 14 point ROS was performed and is negative except as noted in the HPI.   Past Medical History  Diagnosis Date  . Chronic venous insufficiency   . PVD (peripheral vascular disease) ([place])     mild carotid 11/05  . Osteoarthritis     bilat hips, severe  . Obesity   . CONTACT DERMATITIS   . GLUCOSE INTOLERANCE   . VITAMIN D DEFICIENCY   . Edema     pt  states had edema of feet and legs has been on lasix per dr. to help  . CAROTID STENOSIS   . DUPUYTREN'S CONTRACTURE   . MENOPAUSAL SYNDROME   . HLD (hyperlipidemia)   . Hypothyroidism     Dr. Ala Dach in [place]   . Breast cancer ([place])     R     Family History  Problem Relation Age of Onset  . Coronary artery  disease      family hx - female 1st degree relative <60  . Hypothyroidism      aunt   . Uterine cancer Mother 67  . Cancer Mother   . Breast cancer Daughter 9    BRCA negative (no BART)  . Cancer Daughter   . Pancreatic cancer Maternal Grandfather     diagnosed in his 30s  . Anesthesia problems Neg Hx   . Prostate cancer Maternal Uncle     diagnosed in his 73s  . Colon cancer Cousin   . Prostate cancer Paternal Uncle     diagnosed in his 15s  . Clotting disorder Maternal Grandmother   . Uterine cancer Cousin   . Leukemia Cousin   . Prostate cancer Cousin   . Heart disease Father     before age 87  . Varicose Veins Father   . Heart disease Brother     before age 8  . Cancer Sister      Social History:  reports that she has never smoked. She has never used smokeless tobacco. She reports that she does not drink alcohol or use illicit drugs.   Exam: Current vital signs: BP 141/56 mmHg  Pulse 55  Temp(Src) 98.5 F (36.9 C) (  Oral)  Resp 16  Ht 5' 7.5" (1.715 m)  Wt 92.1 kg (203 lb 0.7 oz)  BMI 31.31 kg/m2  SpO2 95% Vital signs in last 24 hours: Temp:  [98.4 F (36.9 C)-99.1 F (37.3 C)] 98.5 F (36.9 C) (03/07 2035) Pulse Rate:  [53-62] 55 (03/07 2035) Resp:  [16-18] 16 (03/07 2035) BP: (93-141)/(43-78) 141/56 mmHg (03/07 2035) SpO2:  [93 %-98 %] 95 % (03/07 2035) Weight:  [92.1 kg (203 lb 0.7 oz)] 92.1 kg (203 lb 0.7 oz) (03/07 1048)   Physical Exam  Constitutional: Appears well-developed and well-nourished.  Psych: Affect appropriate to situation Eyes: No scleral injection HENT: No OP obstrucion Head: Normocephalic.  Cardiovascular: Normal rate and regular rhythm.  Respiratory: Effort normal and breath sounds normal to anterior ascultation GI: Soft.  No distension. There is no tenderness.  Skin: WDI  Neuro: Mental Status: Patient is awake, alert, oriented to person, place, month, year, and situation. Patient is able to give a clear and coherent  history. No signs of aphasia or neglect Cranial Nerves: II: Visual Fields are full. Pupils are equal, round, and reactive to light.   III,IV, VI: EOMI without ptosis or diploplia.  V: Facial sensation is symmetric to temperature VII: Facial movement is symmetric.  VIII: hearing is intact to voice X: Uvula elevates symmetrically XI: Shoulder shrug is symmetric. XII: tongue is midline without atrophy or fasciculations.  Motor: She has markedly limitation of movement in either arm due to arthralgias. In her legs, she is 4/5 bilaterally.  Sensory: Sensation is Diminished in a stocking distribution to just above the ankles bilaterally  Deep Tendon Reflexes: 1+ in the biceps and patellae On the right,2+ on the left Cerebellar: Unable to perform due to pain   I have reviewed labs in epic and the results pertinent to this consultation are:  ESR 61 10 months ago, 47 for months ago   CRP was normal in October, but is borderline elevated now  Impression: 82 year old female with lower extremity weakness as well as upper extremity joint pain and arthralgia. I am not sure that these are the same process. She does have asymmetric reflexes with the arm and legs both risk or on the left and right, I am not certain which is the abnormal side but I suspect she is slightly hyperreflexic on the left.   Given her neck pain and bilateral leg weakness, as well as asymmetric reflexes she will need imaging. I will perform on MRI of both her brain and C-spine.  Steroid myopathy usually occurs in longer-term steroid use situations. It is not painful, and I do not think it is responsible for her upper extremity symptoms at this time. She does have at least one set of a inflamed wrist previously which was thought to be gout.    It is certainly  possible that whatever process she has going on is appearing worse currently due to her UTI.   Recommendations: 1) ESR, CK 2) MRI brain and C-spine 3) Patient will  need an EMG, but this cannot be done as an inpatient at this institution.    Roland Rack, MD [facility] [phone]  If 7pm- 7am, please page neurology on call as listed in [place].

## 2015-03-18 NOTE — Evaluation (Signed)
Physical Therapy Evaluation Patient Details Name: Rachael [name] MRN: [id number] DOB: 1933/05/02 Today's Date: 03/18/2015   History of Present Illness  Rachael [name] is a 82 y.o. female who presents to the ED 03/17/15 with acute on chronic weakness. She has been having generalized muscle weakness for several months due to steroid myopathy after being given prednisone  Clinical Impression  The patient is quite  debilitated with UE's more involved. Patient agreeable to SNF. Pt admitted with above diagnosis. Pt currently with functional limitations due to the deficits listed below (see PT Problem List). Pt will benefit from skilled PT to increase their independence and safety with mobility to allow discharge to the venue listed below.       Follow Up Recommendations SNF;Supervision/Assistance - 24 hour    Equipment Recommendations  None recommended by PT    Recommendations for Other Services       Precautions / Restrictions Precautions Precautions: Fall      Mobility  Bed Mobility Overal bed mobility: Needs Assistance Bed Mobility: Supine to Sit;Sit to Supine     Supine to sit: Mod assist Sit to supine: Max assist;+2 for safety/equipment;+2 for physical assistance   General bed mobility comments: assist with trunk and legs, more assist to return to bed.  Transfers Overall transfer level: Needs assistance Equipment used: Rolling walker (2 wheeled) Transfers: Sit to/from [facility] Sit to Stand: Mod assist;+2 physical assistance;+2 safety/equipment Stand pivot transfers: +2 physical assistance;+2 safety/equipment;Mod assist       General transfer comment: only places the hands on RW flat style, no flexion of fingers tolerated. small shuffle steps to turn and pivot to the [facility] and back to bed.   Ambulation/Gait                Stairs            Wheelchair Mobility    Modified Rankin (Stroke Patients Only)       Balance                                              Pertinent Vitals/Pain Pain Assessment: 0-10 Pain Score: 7  Pain Location: both hands, feet Pain Descriptors / Indicators: Aching;Constant;Tender Pain Intervention(s): Limited activity within patient's tolerance;Monitored during session;Repositioned    Home Living Family/patient expects to be discharged to:: Skilled nursing facility                      Prior Function                 Hand Dominance        Extremity/Trunk Assessment   Upper Extremity Assessment: Defer to OT evaluation;RUE deficits/detail;LUE deficits/detail RUE Deficits / Details: significant decreased flexion of fingers,     LUE Deficits / Details: similar to right   Lower Extremity Assessment: Generalized weakness         Communication      Cognition Arousal/Alertness: Awake/alert Behavior During Therapy: WFL for tasks assessed/performed Overall Cognitive Status: Within Functional Limits for tasks assessed                      General Comments      Exercises        Assessment/Plan    PT Assessment Patient needs continued PT services  PT Diagnosis Difficulty walking;Generalized weakness;Acute pain  PT Problem List Decreased strength;Decreased range of motion;Decreased activity tolerance;Decreased balance;Decreased mobility;Decreased coordination;Decreased knowledge of use of DME;Decreased safety awareness;Pain;Decreased knowledge of precautions;Impaired sensation;Impaired tone  PT Treatment Interventions DME instruction;Gait training;Functional mobility training;Therapeutic activities;Therapeutic exercise;Patient/family education   PT Goals (Current goals can be found in the Care Plan section) Acute Rehab PT Goals Patient Stated Goal: to go to rehab PT Goal Formulation: With patient/family Time For Goal Achievement: 04/01/15 Potential to Achieve Goals: Good    Frequency Min 3X/week   Barriers to discharge         Co-evaluation               End of Session Equipment Utilized During Treatment: Gait belt Activity Tolerance: Patient tolerated treatment well;Patient limited by pain;Patient limited by fatigue Patient left: in bed;with call bell/phone within reach;with bed alarm set Nurse Communication: Mobility status         Time: 1430-1454 PT Time Calculation (min) (ACUTE ONLY): 24 min   Charges:   PT Evaluation $PT Eval Low Complexity: 1 Procedure PT Treatments $Therapeutic Activity: 8-22 mins   PT G Codes:        Claretha Cooper 03/18/2015, 3:08 PM Tresa Endo PT [phone]

## 2015-03-18 NOTE — ED Notes (Signed)
Checked on pt and she is sleeping at this time

## 2015-03-19 ENCOUNTER — Inpatient Hospital Stay (HOSPITAL_COMMUNITY): Payer: PPO

## 2015-03-19 DIAGNOSIS — R29898 Other symptoms and signs involving the musculoskeletal system: Secondary | ICD-10-CM

## 2015-03-19 DIAGNOSIS — M1990 Unspecified osteoarthritis, unspecified site: Secondary | ICD-10-CM | POA: Insufficient documentation

## 2015-03-19 DIAGNOSIS — R531 Weakness: Secondary | ICD-10-CM

## 2015-03-19 DIAGNOSIS — R6510 Systemic inflammatory response syndrome (SIRS) of non-infectious origin without acute organ dysfunction: Secondary | ICD-10-CM | POA: Insufficient documentation

## 2015-03-19 LAB — BASIC METABOLIC PANEL WITH GFR
Anion gap: 11 (ref 5–15)
BUN: 11 mg/dL (ref 6–20)
CO2: 25 mmol/L (ref 22–32)
Calcium: 9.1 mg/dL (ref 8.9–10.3)
Chloride: 107 mmol/L (ref 101–111)
Creatinine, Ser: 0.63 mg/dL (ref 0.44–1.00)
GFR calc Af Amer: >60 mL/min
GFR calc non Af Amer: >60 mL/min
Glucose, Bld: 95 mg/dL (ref 65–99)
Potassium: 3.6 mmol/L (ref 3.5–5.1)
Sodium: 143 mmol/L (ref 135–145)

## 2015-03-19 LAB — URINE CULTURE

## 2015-03-19 LAB — URIC ACID: Uric Acid, Serum: 5 mg/dL (ref 2.3–6.6)

## 2015-03-19 LAB — CK: Total CK: 14 U/L — ABNORMAL LOW (ref 38–234)

## 2015-03-19 LAB — CBC
HEMATOCRIT: 27.4 % — AB (ref 36.0–46.0)
HEMOGLOBIN: 8.5 g/dL — AB (ref 12.0–15.0)
MCH: 24.4 pg — AB (ref 26.0–34.0)
MCHC: 31 g/dL (ref 30.0–36.0)
MCV: 78.5 fL (ref 78.0–100.0)
Platelets: 526 10*3/uL — ABNORMAL HIGH (ref 150–400)
RBC: 3.49 MIL/uL — AB (ref 3.87–5.11)
RDW: 16.1 % — ABNORMAL HIGH (ref 11.5–15.5)
WBC: 10.2 10*3/uL (ref 4.0–10.5)

## 2015-03-19 LAB — SEDIMENTATION RATE: Sed Rate: 98 mm/h — ABNORMAL HIGH (ref 0–22)

## 2015-03-19 LAB — BASIC METABOLIC PANEL
BUN: 11 mg/dL (ref 4–21)
Creatinine: 0.6 mg/dL (ref 0.5–1.1)
Glucose: 95 mg/dL
SODIUM: 143 mmol/L (ref 137–147)

## 2015-03-19 MED ORDER — METHYLPREDNISOLONE SODIUM SUCC 125 MG IJ SOLR
125.0000 mg | Freq: Once | INTRAMUSCULAR | Status: AC
  Administered 2015-03-19: 125 mg via INTRAVENOUS
  Filled 2015-03-19: qty 2

## 2015-03-19 MED ORDER — LORAZEPAM 2 MG/ML IJ SOLN
1.0000 mg | Freq: Once | INTRAMUSCULAR | Status: AC
  Administered 2015-03-19: 1 mg via INTRAVENOUS
  Filled 2015-03-19: qty 1

## 2015-03-19 MED ORDER — ACETAMINOPHEN 325 MG PO TABS
650.0000 mg | ORAL_TABLET | Freq: Four times a day (QID) | ORAL | Status: DC | PRN
Start: 2015-03-19 — End: 2015-03-20

## 2015-03-19 NOTE — Clinical Social Work Placement (Signed)
   CLINICAL SOCIAL WORK PLACEMENT  NOTE  Date:  03/19/2015  Patient Details  Name: RAELYNNE FADEL MRN: [id number] Date of Birth: November 13, 1933  Clinical Social Work is seeking post-discharge placement for this patient at the [place] level of care (*CSW will initial, date and re-position this form in  chart as items are completed):  Yes   Patient/family provided with [facility] Work Department's list of facilities offering this level of care within the geographic area requested by the patient (or if unable, by the patient's family).  Yes   Patient/family informed of their freedom to choose among providers that offer the needed level of care, that participate in Medicare, Medicaid or managed care program needed by the patient, have an available bed and are willing to accept the patient.  Yes   Patient/family informed of [facility]'s ownership interest in [facility] and [facility], as well as of the fact that they are under no obligation to receive care at these facilities.  PASRR submitted to EDS on       PASRR number received on       Existing PASRR number confirmed on 03/19/15     FL2 transmitted to all facilities in geographic area requested by pt/family on 03/19/15     FL2 transmitted to all facilities within larger geographic area on       Patient informed that his/her managed care company has contracts with or will negotiate with certain facilities, including the following:            Patient/family informed of bed offers received.  Patient chooses bed at       Physician recommends and patient chooses bed at      Patient to be transferred to   on  .  Patient to be transferred to facility by       Patient family notified on   of transfer.  Name of family member notified:        PHYSICIAN Please sign FL2     Additional Comment:    _______________________________________________ Ladell Pier, LCSW 03/19/2015, 11:12 AM

## 2015-03-19 NOTE — Progress Notes (Addendum)
Occupational Therapy Treatment Patient Details Name: Rachael [name] MRN: [id number] DOB: 1933-02-21 Today's Date: 03/19/2015    History of present illness Rachael [name] is a 82 y.o. female who presents to the ED 03/17/15 with acute on chronic weakness. She has been having generalized muscle weakness for several months due to steroid myopathy after being given prednisone   OT comments  Pt is very motivated; edema improved and pt able to perform some self feeding with adapted set up and built up foam.  Had several tests done today and didn't get up this session but plans to  get up later after she rests.    Follow Up Recommendations  CIR    Equipment Recommendations  3 in 1 bedside comode    Recommendations for Other Services Rehab consult    Precautions / Restrictions Precautions Precautions: Fall       Mobility Bed Mobility                  Transfers                 General transfer comment: pt too fatiqued.   Pt was at MRI and CT earlier   Balance                                   ADL Overall ADL's : Needs assistance/impaired Eating/Feeding: Minimal assistance;Bed level;With adaptive utensils                                     General ADL Comments: pt able to self feed a few bits of cut fruit with plastic fork and built up foam.  Positioned R elbow on 2 pillows, raised HOB, and positioned fruit cup on wet paper towel to stabilize.  Needs occasional min A to help reposition food.  Edema much improved and pt tolerated AAROM to bil shoulders much better.  Pt reports that L DIPs 2 and 3 are fused.  She can almost make a complete fist except that digit 2 lags.  Pt able to use squeeze ball with LUE.  RUE too weak, except thumb flexion at least 3+/5      Vision                     Perception     Praxis      Cognition   Behavior During Therapy: [facility] for tasks assessed/performed Overall Cognitive Status: Within Functional  Limits for tasks assessed                       Extremity/Trunk Assessment               Exercises Other Exercises Other Exercises: pt tolerated 5 reps shoulder AAROM to bil UEs; R to 90; L to 60.  5 reps of elbow flexion/extension bil.  Missing approximately 20 from extension and gets painful at 100 flexion; pt with decreased edema in fingers--RUE has 1/2 ROM, no DIP, L has 3/4 ROM, digit 2 lags and no IP flexion noted. Provided squeeze ball for LUE   Shoulder Instructions       General Comments      Pertinent Vitals/ Pain       Pain Score: 2  Pain Location: bil UEs Pain Descriptors / Indicators: Sore Pain Intervention(s): Limited activity within  patient's tolerance;Monitored during session;Repositioned  Home Living                                          Prior Functioning/Environment              Frequency Min 2X/week     Progress Toward Goals  OT Goals(current goals can now be found in the care plan section)  Progress towards OT goals: Progressing toward goals  Acute Rehab OT Goals Patient Stated Goal: to go to rehab  Plan      Co-evaluation                 End of Session     Activity Tolerance Patient tolerated treatment well   Patient Left in bed;with call bell/phone within reach;with family/visitor present;with bed alarm set   Nurse Communication          Time: [id number] OT Time Calculation (min): 24 min  Charges: OT General Charges $OT Visit: 1 Procedure OT Treatments $Self Care/Home Management : 8-22 mins $Therapeutic Exercise: 8-22 mins  Analycia Khokhar 03/19/2015, 3:14 PM  Lesle Chris, OTR/L [phone] 03/19/2015

## 2015-03-19 NOTE — [place] FL2 (Signed)
[place] MEDICAID FL2 LEVEL OF CARE SCREENING TOOL     IDENTIFICATION  Patient Name: Rachael [name] Birthdate: 10/05/1933 Sex: female Admission Date (Current Location): 03/17/2015  [facility] and [place] Number:  Herbalist and Address:  [facility],  [place] [address] [place]      Provider Number: [id number]  Attending Physician Name and Address:  Modena Jansky, MD  Relative Name and Phone Number:       Current Level of Care: Hospital Recommended Level of Care: [place] Prior Approval Number:    Date Approved/Denied:   PASRR Number: [id number]  Discharge Plan: SNF    Current Diagnoses: Patient Active Problem List   Diagnosis Date Noted  . Sepsis secondary to UTI ([place]) 03/18/2015  . Gout attack 10/23/2014  . Toe effusion 05/24/2014  . Anemia in neoplastic disease 04/02/2014  . CKD (chronic kidney disease), stage III 04/02/2014  . Wound infection ([place]) 03/18/2014  . Cellulitis 03/18/2014  . Acute renal failure ([place]) 03/18/2014  . Leg edema, right   . Open wound of leg 02/22/2014  . Breast cancer, right breast ([place]) 05/13/2011  . Incisional hernia, incarcerated, right upper quadrant 07/13/2010  . HEADACHE 11/06/2009  . CONTACT DERMATITIS 06/13/2009  . FATIGUE 06/13/2009  . EDEMA 05/01/2008  . BACK PAIN 03/29/2008  . CAROTID STENOSIS 03/16/2008  . ANXIETY 11/14/2007  . MENOPAUSAL SYNDROME 09/27/2007  . GLUCOSE INTOLERANCE 08/10/2007  . HYPERLIPIDEMIA 08/10/2007  . PERIPHERAL VASCULAR DISEASE 08/10/2007  . Venous (peripheral) insufficiency 08/10/2007  . Vitamin D deficiency 12/26/2006  . OBESITY 12/26/2006  . DUPUYTREN'S CONTRACTURE 12/26/2006  . Hypothyroidism 11/05/2006  . OSTEOARTHRITIS 11/05/2006    Orientation RESPIRATION BLADDER Height & Weight     Self, Time, Situation, Place  Normal Continent Weight: 203 lb 0.7 oz (92.1 kg) Height:  5' 7.5" (171.5 cm)  BEHAVIORAL SYMPTOMS/MOOD NEUROLOGICAL BOWEL  NUTRITION STATUS   (no behaviors)  (NONE) Continent Diet (Diet Regular)  AMBULATORY STATUS COMMUNICATION OF NEEDS Skin   Extensive Assist Verbally Normal                       Personal Care Assistance Level of Assistance  Bathing, Feeding, Dressing Bathing Assistance: Maximum assistance Feeding assistance: Independent Dressing Assistance: Maximum assistance     Functional Limitations Info  Sight, Hearing, Speech Sight Info: Adequate Hearing Info: Adequate Speech Info: Adequate    SPECIAL CARE FACTORS FREQUENCY  PT (By licensed PT), OT (By licensed OT)     PT Frequency: 5 x a day OT Frequency: 5 x a day            Contractures Contractures Info: Not present    Additional Factors Info  Code Status, Allergies Code Status Info: FULL code status Allergies Info: Shellfish Allergy, Percocet, Iodinated Diagnostic Agents, Iodine, Prednisone           Current Medications (03/19/2015):  This is the current hospital active medication list Current Facility-Administered Medications  Medication Dose Route Frequency Provider Last Rate Last Dose  . 0.9 %  sodium chloride infusion   Intravenous Continuous Lacretia Leigh, MD   Stopped at 03/18/15 [phone]  . 0.9 %  sodium chloride infusion   Intravenous Continuous Albertine Patricia, MD 75 mL/hr at 03/18/15 2231    . acetaminophen (TYLENOL) tablet 650 mg  650 mg Oral Q6H PRN Etta Quill, DO   650 mg at 03/18/15 1100  . cefTRIAXone (ROCEPHIN) 1 g in dextrose 5 %  50 mL IVPB  1 g Intravenous Q24H Etta Quill, DO   1 g at 03/19/15 [id number]  . enoxaparin (LOVENOX) injection 40 mg  40 mg Subcutaneous Q24H Etta Quill, DO   40 mg at 03/19/15 0957  . feeding supplement (ENSURE ENLIVE) (ENSURE ENLIVE) liquid 237 mL  237 mL Oral BID BM Silver Huguenin Elgergawy, MD   237 mL at 03/18/15 1200  . iohexol (OMNIPAQUE) 300 MG/ML solution 50 mL  50 mL Oral Once PRN Albertine Patricia, MD      . levothyroxine (SYNTHROID, LEVOTHROID) tablet 137 mcg  137  mcg Oral QAC breakfast Etta Quill, DO   137 mcg at 03/19/15 0801  . traMADol (ULTRAM) tablet 50 mg  50 mg Oral 4 times per day Albertine Patricia, MD   50 mg at 03/18/15 2025     Discharge Medications: Please see discharge summary for a list of discharge medications.  Relevant Imaging Results:  Relevant Lab Results:   Additional Information SSN: [id number]  Adhvik Canady, Hughes Better A, LCSW

## 2015-03-19 NOTE — Progress Notes (Signed)
Initial Nutrition Assessment  DOCUMENTATION CODES:   Obesity unspecified  INTERVENTION:   - Provide patient with morning and afternoon snacks. - Continue to encourage good po intake.   NUTRITION DIAGNOSIS:   Inadequate oral intake related to poor appetite as evidenced by per patient/family report.  GOAL:   Patient will meet greater than or equal to 90% of their needs  MONITOR:   PO intake, Weight trends  REASON FOR ASSESSMENT:   Malnutrition Screening Tool    ASSESSMENT:   82 year old female with past medical history of hypothyroidism, breast cancer S/P lumpectomy , hyperlipidemia, with recent diagnosis of chronic muscle weakness thought to be secondary to steroid-induced neuropathy, by endocrinology and rheumatology as an outpatient, patient presents with acute exacerbation of her weakness over the last 2 days, as well reports fever, in ED workup significant for UTI, and leukocytosis.   Patient reports a very poor appetite for 6 months.  States that during this time she has not been eating many meals, but had mainly been consuming 2 protein shakes per day, each with 15 grams of protein.  During this time she was also taken Juice Plus vitamins.  Pt states that everything has lost its taste.  Daughter reports that she was bringing pt shake each morning, hard boiled eggs, and grilled chicken sandwiches to try and get her to eat more solid foods.  Pt would eat minimal amounts of these solid foods.  Patient consumed about 30% of lunch meal of 3/7 and no breakfast this morning.  Unable to do Nutrition Focused Physical Exam due to transport there to take patient for an MRI of brain and C-spine.  Medications and labs reviewed.   Pt reports a 60 lb weight loss x 4 months, which cannot be verified in chart.  Per chart review, patient lost 27 lbs since 10/01/14, representing a 12% weight loss x 6 months, significant for this time period. Suspect patient is at risk for malnutrition; however,  unable to identify at this time.   Daughter stated that she is going to provide patient with lunch, grilled nuggets from [employer].  Patient amenable to consuming snacks during the day.  Dietetic Intern to order.  Diet Order:  Diet regular Room service appropriate?: Yes; Fluid consistency:: Thin  Skin:  Reviewed, no issues  Last BM:  3/7  Height:   Ht Readings from Last 1 Encounters:  03/18/15 5' 7.5" (1.715 m)    Weight:   Wt Readings from Last 1 Encounters:  03/18/15 203 lb 0.7 oz (92.1 kg)    Ideal Body Weight:  62.5 kg  BMI:  Body mass index is 31.31 kg/(m^2).  Estimated Nutritional Needs:   Kcal:  2000-2200 kcal  Protein:  80-90 grams  Fluid:  >/= 2L  EDUCATION NEEDS:   No education needs identified at this time  Veronda Prude, Dietetic Intern Pager: [phone]

## 2015-03-19 NOTE — Progress Notes (Signed)
Subjective:no change over night  Exam: Filed Vitals:   03/18/15 2035 03/19/15 0509  BP: 141/56 131/61  Pulse: 55 61  Temp: 98.5 F (36.9 C) 98.1 F (36.7 C)  Resp: 16 14    HEENT-  Normocephalic, no lesions, without obvious abnormality.  Normal external eye and conjunctiva.  Normal TM's bilaterally.  Normal auditory canals and external ears. Normal external nose, mucus membranes and septum.  Normal pharynx. Cardiovascular- S1, S2 normal, pulses palpable throughout   Lungs- chest clear, no wheezing, rales, normal symmetric air entry Abdomen- normal findings: bowel sounds normal Extremities- interphangial joints enlarged/inflammed and boggy on hands.  Lymph-no adenopathy palpable Musculoskeletal-bilateral joint tenderness of fingers and right ankle Skin-dry    Gen: In bed, NAD Mental Status: Patient is awake, alert, oriented to person, place, month, year, and situation. Patient is able to give a clear and coherent history. No signs of aphasia or neglect Cranial Nerves: II: Visual Fields are full. Pupils are equal, round, and reactive to light.  III,IV, VI: EOMI without ptosis or diploplia.  V: Facial sensation is symmetric to temperature VII: Facial movement is symmetric.  VIII: hearing is intact to voice X: Uvula elevates symmetrically XI: Shoulder shrug is symmetric. XII: tongue is midline without atrophy or fasciculations.  Motor: She has markedly limitation of movement in either arm due to arthralgias and joint inflammation of PCP and MCP. In her legs, she is 4/5 bilaterally.  Sensory: Sensation is Diminished in a stocking distribution to just above the ankles bilaterally  Deep Tendon Reflexes: 1+ in the biceps and patellae On the right,1+ on the left Cerebellar: Unable to perform due to pain  Pertinent Labs: MRI pending CK NWL ESR  98 RF pending TSH normal Uric acid WNL  David Smith PA-C [facility] [phone]  Impression: 81-year-old  female with lower extremity weakness as well as upper extremity joint pain and arthralgia which has progressed over 4 months. CK WNL less likely inflamitory myopathy. Bilateral intraphalangeal joints very boggy and tender. ESR elevated. RF pending. MRI C-spine and brain pending. B12 in upper normal limits.    Recommendations: 1) Agree with RF 2) MRI pending  I personally participated in this patient's evaluation and management, including formulating above clinical impression and management recommendations.  CR Stewart M.D. [facility] [phone]   03/19/2015, 9:03 AM  

## 2015-03-19 NOTE — Progress Notes (Signed)
PROGRESS NOTE    Rachael [name]  DEY:[id number]  DOB: 1933/02/09  DOA: 03/17/2015 PCP: No PCP Per Patient Outpatient Specialists: Endocrinology: Dr. Augusto Gamble in [place], [place] Rheumatology: Dr. Doroteo Glassman course: 82 y/o female with PMH of HLD, hypothyroid, presented to ED with h/o progressive LE weakness of 3-4 months duration (no joint pains in LE's) and new onset/~ 2 weeks of b/l UE joint pains, swelling and difficulty using hands to function. Strong smelling urine and low grade fevers. Has been on 3 short courses on steroids for various reasons in last 3-4 months. Seen by OP Rheumatology (Dr. Meda Coffee) in Nov and initially told NOT to have RA, has an appointment to see new Rheumatologist Dr. Sabino Niemann on 03/20/15. She was admitted for presumed UTI, lower extremity weakness and pain and difficulty using her upper extremities.   Assessment & Plan:   SIRS - SIRS present on admission with fever, leukocytosis and positive urine microscopy. Chest x-ray negative. Unclear etiology. - Started empirically on IV Rocephin. - Blood cultures 2: Negative to date. Urine culture: Suggestive of contamination and hence not helpful. - Sepsis physiology resolved. - Check flu panel PCR.  Lower extremity weakness - Unclear etiology.? Deconditioning. - Neurology consultation appreciated. - MRI brain without acute findings. MRI C-spine: Questionable mild diffuse abnormal paraspinal muscle signal-unclear etiology. - CK normal. - Steroid induced myopathy seems less likely in that she has not used steroids for prolonged periods. - As per rehabilitation recommendations, SNF when medically stable.  Upper extremity joint pain and? Arthritis of wrists - Unclear etiology.? Rheumatoid arthritis. - ESR 98. CRP 20.3 - Uric acid: 5, CK 14. - Oh rheumatoid factor, C3, C4, aldolase, ANA: Pending - Rheumatoid factor on 11/05/14:16  - Patient had been seen by rheumatology in November 2016 and told that she did  not have rheumatoid arthritis. - Discussed with Timmie Foerster, Rheumatology on 03/19/15 and she suspects that patient may still have rheumatoid arthritis and recommends trial of her dose of IV Solu-Medrol 125 MG Times one and if has good response then prednisone 10 MG twice a day until outpatient follow-up with her in a week's time. Discussed extensively with patient and daughter who had expressed concern regarding taking steroids. She does not have a true allergy and has consented to taking it.  Weight loss - Unclear etiology. - Given known history of breast cancer in remission, concern was for recurrent malignancy. - CT chest, abdomen and pelvis are without acute findings.  Hypothyroid - TSH normal. Continue Synthroid.  Anemia - Likely related to chronic disease. Follow CBCs  Thrombocytosis - Possibly reactive.    DVT prophylaxis: Lovenox  Code Status: Full  Family Communication: Discussed at length with patient's daughter at bedside  Disposition Plan: DC to SNF when medically stable.  Consultants:  Neurology  Procedures:  None   Antimicrobials:  IV Rocephin 3/6 >   Subjective: Complaints of pain in both wrists, swelling of fingers and hands and unable to hold things. Denies any pain in her lower extremities.  Objective: Filed Vitals:   03/18/15 1355 03/18/15 2035 03/19/15 0509 03/19/15 1327  BP:  141/56 131/61 138/61  Pulse:  55 61 57  Temp: 99.1 F (37.3 C) 98.5 F (36.9 C) 98.1 F (36.7 C) 97.4 F (36.3 C)  TempSrc:  Oral Oral Oral  Resp:  _0 Height:      Weight:      SpO2:  95% 97% 98%    Intake/Output Summary (  Last 24 hours) at 03/19/15 1926 Last data filed at 03/19/15 1832  Gross per 24 hour  Intake 4427.08 ml  Output   1700 ml  Net 2727.08 ml   Filed Weights   03/18/15 1048  Weight: 92.1 kg (203 lb 0.7 oz)    Exam:  General exam: Pleasant elderly female lying comfortably supine in bed.  Respiratory system: Clear. No increased work  of breathing. Cardiovascular system: S1 & S2 heard, RRR. No JVD, murmurs, gallops, clicks or pedal edema. Gastrointestinal system: Abdomen is nondistended, soft and nontender. Normal bowel sounds heard. Central nervous system: Alert and oriented. No focal neurological deficits. Extremities: Symmetric 5 x 5 power.Mild increased warmth and tenderness of right wrist. Mildly painful range of movements in both wrists. No arthritic changes in bilateral elbows or shoulders. Boggy swelling of small joints of her hand without tenderness or other acute findings.  Data Reviewed: Basic Metabolic Panel:  Recent Labs Lab 03/17/15 2302 03/19/15 0340  NA 134* 143  K 3.8 3.6  CL 99* 107  CO2 25 25  GLUCOSE 120* 95  BUN 15 11  CREATININE 0.77 0.63  CALCIUM 9.2 9.1   Liver Function Tests:  Recent Labs Lab 03/17/15 2302  AST 16  ALT 11*  ALKPHOS 65  BILITOT 0.8  PROT 6.3*  ALBUMIN 2.6*   No results for input(s): LIPASE, AMYLASE in the last 168 hours. No results for input(s): AMMONIA in the last 168 hours. CBC:  Recent Labs Lab 03/17/15 2302 03/19/15 0340  WBC 14.8* 10.2  NEUTROABS 11.4*  --   HGB 9.4* 8.5*  HCT 29.4* 27.4*  MCV 78.2 78.5  PLT 552* 526*   Cardiac Enzymes:  Recent Labs Lab 03/18/15 0006 03/18/15 2328  CKTOTAL 18* 14*   BNP (last 3 results) No results for input(s): PROBNP in the last 8760 hours. CBG: No results for input(s): GLUCAP in the last 168 hours.  Recent Results (from the past 240 hour(s))  Culture, blood (Routine X 2) w Reflex to ID Panel     Status: None (Preliminary result)   Collection Time: 03/17/15 10:53 PM  Result Value Ref Range Status   Specimen Description BLOOD LEFT ANTECUBITAL  Final   Special Requests BOTTLES DRAWN AEROBIC AND ANAEROBIC 5ML  Final   Culture   Final    NO GROWTH 1 DAY Performed at [facility]    Report Status PENDING  Incomplete  Culture, blood (Routine X 2) w Reflex to ID Panel     Status: None  (Preliminary result)   Collection Time: 03/18/15 12:06 AM  Result Value Ref Range Status   Specimen Description BLOOD LEFT FOREARM  Final   Special Requests BOTTLES DRAWN AEROBIC AND ANAEROBIC 5ML  Final   Culture   Final    NO GROWTH 1 DAY Performed at [facility]    Report Status PENDING  Incomplete  Urine culture     Status: None   Collection Time: 03/18/15  2:57 AM  Result Value Ref Range Status   Specimen Description URINE, CLEAN CATCH  Final   Special Requests NONE  Final   Culture   Final    MULTIPLE SPECIES PRESENT, SUGGEST RECOLLECTION Performed at [facility]    Report Status 03/19/2015 FINAL  Final         Studies: Ct Abdomen Pelvis Wo Contrast  03/19/2015  CLINICAL DATA:  Fever, weight loss.  History of breast cancer. EXAM: CT CHEST, ABDOMEN AND PELVIS WITHOUT CONTRAST TECHNIQUE: Multidetector  CT imaging of the chest, abdomen and pelvis was performed following the standard protocol without IV contrast. COMPARISON:  None. FINDINGS: CT CHEST Mediastinum/Nodes: Heart is borderline in size. Scattered coronary artery and aortic calcifications. No evidence of aortic aneurysm. No mediastinal, hilar, or axillary adenopathy. Lungs/Pleura: Dependent linear densities in the lungs could reflect atelectasis or scarring. No confluent airspace opacities or pleural effusions. Musculoskeletal: Chest wall soft tissues are unremarkable. No acute bony abnormality. CT ABDOMEN AND PELVIS Hepatobiliary: Prior cholecystectomy. No visible focal hepatic abnormality. Pancreas: Unremarkable unenhanced appearance Spleen: Unremarkable unenhanced appearance Adrenals/Urinary Tract: Low-density lesions within the kidneys bilaterally, the largest in the lower pole of the left kidney measuring up to 6 cm. These were shown to represent cysts on prior CT. The lower pole cysts has enlarged since prior study. No hydronephrosis. No renal or ureteral stones. Urinary bladder is grossly unremarkable  although difficult to visualize due to beam hardening artifact from bilateral hip replacements. Stomach/Bowel: Stomach, large and small bowel unremarkable. Vascular/Lymphatic: Aorta is normal caliber.  No adenopathy Reproductive: No adnexal or pelvic masses. Other: Prior ventral hernia repair.  No free fluid or free air. Musculoskeletal: Prior bilateral hip replacements. No acute bony abnormality. Degenerative changes in the lumbar spine. IMPRESSION: No acute findings or significant abnormality in the chest, abdomen or pelvis. Electronically Signed   By: Rolm Baptise M.D.   On: 03/19/2015 07:11   Dg Chest 2 View  03/18/2015  CLINICAL DATA:  Progressive weakness over several weeks, weight loss. EXAM: CHEST  2 VIEW COMPARISON:  Radiographs 08/07/2010 FINDINGS: Mild cardiomegaly, new from prior exam. Suspect small left pleural effusion with basilar atelectasis. No pulmonary edema. No consolidation to suggest pneumonia. No pneumothorax. Chronic change about the right shoulder. Age related degenerative change in the spine. Surgical clips in the right axilla. IMPRESSION: Mild cardiomegaly, new from 2012. Small left pleural effusion with adjacent atelectasis. Electronically Signed   By: Jeb Levering M.D.   On: 03/18/2015 00:03   Ct Chest Wo Contrast  03/19/2015  CLINICAL DATA:  Fever, weight loss.  History of breast cancer. EXAM: CT CHEST, ABDOMEN AND PELVIS WITHOUT CONTRAST TECHNIQUE: Multidetector CT imaging of the chest, abdomen and pelvis was performed following the standard protocol without IV contrast. COMPARISON:  None. FINDINGS: CT CHEST Mediastinum/Nodes: Heart is borderline in size. Scattered coronary artery and aortic calcifications. No evidence of aortic aneurysm. No mediastinal, hilar, or axillary adenopathy. Lungs/Pleura: Dependent linear densities in the lungs could reflect atelectasis or scarring. No confluent airspace opacities or pleural effusions. Musculoskeletal: Chest wall soft tissues are  unremarkable. No acute bony abnormality. CT ABDOMEN AND PELVIS Hepatobiliary: Prior cholecystectomy. No visible focal hepatic abnormality. Pancreas: Unremarkable unenhanced appearance Spleen: Unremarkable unenhanced appearance Adrenals/Urinary Tract: Low-density lesions within the kidneys bilaterally, the largest in the lower pole of the left kidney measuring up to 6 cm. These were shown to represent cysts on prior CT. The lower pole cysts has enlarged since prior study. No hydronephrosis. No renal or ureteral stones. Urinary bladder is grossly unremarkable although difficult to visualize due to beam hardening artifact from bilateral hip replacements. Stomach/Bowel: Stomach, large and small bowel unremarkable. Vascular/Lymphatic: Aorta is normal caliber.  No adenopathy Reproductive: No adnexal or pelvic masses. Other: Prior ventral hernia repair.  No free fluid or free air. Musculoskeletal: Prior bilateral hip replacements. No acute bony abnormality. Degenerative changes in the lumbar spine. IMPRESSION: No acute findings or significant abnormality in the chest, abdomen or pelvis. Electronically Signed   By: Rolm Baptise M.D.  On: 03/19/2015 07:11   Mr Brain Wo Contrast  03/19/2015  CLINICAL DATA:  82 year old female with weakness since taking steroids in October for arthritis. Initial encounter. Personal history of breast cancer in 2013. EXAM: MRI HEAD WITHOUT CONTRAST MRI CERVICAL SPINE WITHOUT CONTRAST TECHNIQUE: Multiplanar, multiecho pulse sequences of the brain and surrounding structures, and cervical spine, to include the craniocervical junction and cervicothoracic junction, were obtained without intravenous contrast. COMPARISON:  Cervical spine and brain MRI 06/20/2011 FINDINGS: MRI HEAD FINDINGS Major intracranial vascular flow voids are stable. Tortuous distal cervical ICAs re- demonstrated. Cerebral volume is not significantly changed since 2013. No restricted diffusion to suggest acute infarction. No  midline shift, mass effect, evidence of mass lesion, ventriculomegaly, extra-axial collection or acute intracranial hemorrhage. Cervicomedullary junction and pituitary are within normal limits. [place] Cables and white matter signal is stable and within normal limits for age throughout the brain. No cortical encephalomalacia or chronic cerebral blood products identified. Visible internal auditory structures appear normal. Mastoids are clear. Trace fluid or mucosal thickening in the left sphenoid sinus, other paranasal sinuses are clear. Negative orbit and scalp soft tissues. Bone marrow signal is stable and within normal limits. MRI CERVICAL SPINE FINDINGS Study is intermittently degraded by motion artifact despite repeated imaging attempts. Cervical vertebral height and alignment is stable since 2013, with straightening of lordosis and mild anterolisthesis at C3-C4. Mild degenerative appearing right posterior endplate marrow edema at C4-C5 (series 16, image 3). No other acute osseous abnormality. Chronic ligamentous hypertrophy about the odontoid process has not significantly changed. On STIR imaging there is questionable diffuse increased STIR signal in the posterior paraspinal muscles (series 16). No appreciable muscle atrophy since 2013. C2-C3: Stable mild facet and uncovertebral hypertrophy. Stable mild left C3 foraminal stenosis. C3-C4: Chronic anterolisthesis with moderate to severe left facet hypertrophy. No spinal stenosis. Stable severe left C4 foraminal stenosis. C4-C5: Chronic anterior eccentric disc osteophyte complex. No spinal stenosis. Uncovertebral hypertrophy. Mild bilateral C5 foraminal stenosis is stable to slightly increased. C5-C6: Chronic disc space loss and anterior eccentric disc osteophyte complex. No spinal stenosis. Mild to moderate C6 foraminal stenosis greater on the right appears stable. C6-C7: Stable mostly anterior and lateral disc osteophyte complex. No spinal or foraminal stenosis. C7-T1:  Moderate facet hypertrophy greater on the left has not significantly changed. No spinal stenosis. Moderate left C8 foraminal stenosis is stable. No upper thoracic spinal stenosis. Spinal cord signal is within normal limits at all visualized levels. IMPRESSION: 1. Stable and negative for age noncontrast MRI appearance the brain compared to 2013. 2. Questionable mild diffuse abnormal paraspinal muscle signal such as due to myositis. No appreciable muscle atrophy when compared to 2013. 3. Otherwise no significant change in the MRI appearance of the cervical spine since 2013. No spinal stenosis. No visualized spinal cord abnormality. Electronically Signed   By: Genevie Ann M.D.   On: 03/19/2015 12:32   Mr Cervical Spine Wo Contrast  03/19/2015  CLINICAL DATA:  82 year old female with weakness since taking steroids in October for arthritis. Initial encounter. Personal history of breast cancer in 2013. EXAM: MRI HEAD WITHOUT CONTRAST MRI CERVICAL SPINE WITHOUT CONTRAST TECHNIQUE: Multiplanar, multiecho pulse sequences of the brain and surrounding structures, and cervical spine, to include the craniocervical junction and cervicothoracic junction, were obtained without intravenous contrast. COMPARISON:  Cervical spine and brain MRI 06/20/2011 FINDINGS: MRI HEAD FINDINGS Major intracranial vascular flow voids are stable. Tortuous distal cervical ICAs re- demonstrated. Cerebral volume is not significantly changed since 2013. No restricted diffusion to  suggest acute infarction. No midline shift, mass effect, evidence of mass lesion, ventriculomegaly, extra-axial collection or acute intracranial hemorrhage. Cervicomedullary junction and pituitary are within normal limits. [place] Cables and white matter signal is stable and within normal limits for age throughout the brain. No cortical encephalomalacia or chronic cerebral blood products identified. Visible internal auditory structures appear normal. Mastoids are clear. Trace fluid or  mucosal thickening in the left sphenoid sinus, other paranasal sinuses are clear. Negative orbit and scalp soft tissues. Bone marrow signal is stable and within normal limits. MRI CERVICAL SPINE FINDINGS Study is intermittently degraded by motion artifact despite repeated imaging attempts. Cervical vertebral height and alignment is stable since 2013, with straightening of lordosis and mild anterolisthesis at C3-C4. Mild degenerative appearing right posterior endplate marrow edema at C4-C5 (series 16, image 3). No other acute osseous abnormality. Chronic ligamentous hypertrophy about the odontoid process has not significantly changed. On STIR imaging there is questionable diffuse increased STIR signal in the posterior paraspinal muscles (series 16). No appreciable muscle atrophy since 2013. C2-C3: Stable mild facet and uncovertebral hypertrophy. Stable mild left C3 foraminal stenosis. C3-C4: Chronic anterolisthesis with moderate to severe left facet hypertrophy. No spinal stenosis. Stable severe left C4 foraminal stenosis. C4-C5: Chronic anterior eccentric disc osteophyte complex. No spinal stenosis. Uncovertebral hypertrophy. Mild bilateral C5 foraminal stenosis is stable to slightly increased. C5-C6: Chronic disc space loss and anterior eccentric disc osteophyte complex. No spinal stenosis. Mild to moderate C6 foraminal stenosis greater on the right appears stable. C6-C7: Stable mostly anterior and lateral disc osteophyte complex. No spinal or foraminal stenosis. C7-T1: Moderate facet hypertrophy greater on the left has not significantly changed. No spinal stenosis. Moderate left C8 foraminal stenosis is stable. No upper thoracic spinal stenosis. Spinal cord signal is within normal limits at all visualized levels. IMPRESSION: 1. Stable and negative for age noncontrast MRI appearance the brain compared to 2013. 2. Questionable mild diffuse abnormal paraspinal muscle signal such as due to myositis. No appreciable  muscle atrophy when compared to 2013. 3. Otherwise no significant change in the MRI appearance of the cervical spine since 2013. No spinal stenosis. No visualized spinal cord abnormality. Electronically Signed   By: Genevie Ann M.D.   On: 03/19/2015 12:32        Scheduled Meds: . cefTRIAXone (ROCEPHIN)  IV  1 g Intravenous Q24H  . enoxaparin (LOVENOX) injection  40 mg Subcutaneous Q24H  . levothyroxine  137 mcg Oral QAC breakfast  . traMADol  50 mg Oral 4 times per day   Continuous Infusions: . sodium chloride Stopped (03/18/15 0349)  . sodium chloride 1,000 mL (03/19/15 1043)    Principal Problem:   Sepsis secondary to UTI ([place])    Time spent: 30 minutes.    Vernell Leep, MD, FACP, FHM. [facility] Pager [phone]  If 7PM-7AM, please contact night-coverage [email or web address] Password [email or web address] 03/19/2015, 7:26 PM    LOS: 1 day

## 2015-03-19 NOTE — Progress Notes (Signed)
OT Cancellation Note  Patient Details Name: Rachael [name] MRN: [id number] DOB: 1933-09-24   Cancelled Treatment:    Reason Eval/Treat Not Completed: Patient at procedure or test/ unavailable. Will try to return later today if schedule permits.  Jenasis Straley 03/19/2015, 1:01 PM  Lesle Chris, OTR/L [phone] 03/19/2015

## 2015-03-19 NOTE — Clinical Social Work Note (Signed)
Clinical Social Work Assessment  Patient Details  Name: Rachael [name] MRN: [id number] Date of Birth: January 09, 1934  Date of referral:  03/19/15               Reason for consult:  Discharge Planning                Permission sought to share information with:  Family Supports Permission granted to share information::     Name::     Joellen Jersey  Agency::     Relationship::  daughter  Contact Information:  [phone]  Housing/Transportation Living arrangements for the past 2 months:  Single Family Home Source of Information:  Patient, Adult Children Patient Interpreter Needed:  None Criminal Activity/Legal Involvement Pertinent to Current Situation/Hospitalization:  No - Comment as needed Significant Relationships:  Adult Children Lives with:  Self Do you feel safe going back to the place where you live?  No Need for family participation in patient care:  No (Coment) (pt daughter at bedside and pt is agreeable to pt daughter being involved in conversation about disposition)  Care giving concerns:  Pt admitted from home alone. MD states request has been made for [facility] Inpatient rehab. CSW assessing pt for SNF as secondary option.   Social Worker assessment / plan:    CSW received referral for New SNF. Per MD, MD was requested to place [facility] Inpatient Rehab consult.   CSW met with pt and pt daughter at bedside. CSW introduced self and explained role. Pt reports that she is from home where she lives alone. CSW discussed recommendations for rehab upon discharge. Pt discussed that MD discussed consult to [employer]. CSW discussed that CSW noted consult had been placed. CSW explained that CSW discusses rehab at SNF to have secondary options. Pt agreeable to SNF search for secondary options. If plan needs to be SNF then pt prefers [place]. CSW provided support to pt as she discussed that she is undergoing testing to get a better understanding of what is going on medically and has an MRI  planned for today.  CSW completed FL2 and initiated SNF search to [facility].  CSW to follow up on recommendations from [place] and give SNF bed offers for secondary options.   CSW to continue to follow to provide support and assist with pt disposition needs.   Employment status:  Retired Nurse, adult PT Recommendations:  [place] / Referral to community resources:  [place]  Patient/Family's Response to care:  Pt alert and oriented x 4. Pt daughter supportive and involved in care. Pt expressed understanding about need to have secondary options of SNF and that [facility] Inpatient rehab has been consulted.   Patient/Family's Understanding of and Emotional Response to Diagnosis, Current Treatment, and Prognosis:  Pt displayed understanding surrounding diagnosis and treatment plan. Pt able to relay information to CSW that MD had provided to pt this morning.   Emotional Assessment Appearance:  Appears stated age Attitude/Demeanor/Rapport:  Other (cooperative) Affect (typically observed):  Appropriate, Pleasant Orientation:  Oriented to Self, Oriented to Place, Oriented to  Time, Oriented to Situation Alcohol / Substance use:  Not Applicable Psych involvement (Current and /or in the community):  No (Comment)  Discharge Needs  Concerns to be addressed:  Discharge Planning Concerns Readmission within the last 30 days:  No Current discharge risk:  Lives alone, Physical Impairment Barriers to Discharge:  Continued Medical Work up   Malcolm Quast, [place], LCSW  03/19/2015, 11:03 AM  [phone]

## 2015-03-19 NOTE — Progress Notes (Signed)
Inpatient Rehabilitation  OT is recommending IP Rehab.  PT is recommending SNF.   I have spoken with pt's daughter Joellen Jersey over the phone.  I explained about our IP Rehab program and that pt. would likely need 24 hour supervision and assist following a short course of IP Rehab.  Daughter works and is unable to provide 24 hour support. She states that she and her mom have discussed pt. going to SNF at [facility] place for a longer recovery period.  I have updated Claiborne Billings, [place] (who is covering for [employer] this afternoon) of the need for SNF.  I will sign off.  Please call if questions.  [place] Admissions Coordinator Cell [phone] Office [phone]

## 2015-03-20 DIAGNOSIS — I517 Cardiomegaly: Secondary | ICD-10-CM | POA: Diagnosis not present

## 2015-03-20 DIAGNOSIS — M6281 Muscle weakness (generalized): Secondary | ICD-10-CM | POA: Diagnosis not present

## 2015-03-20 DIAGNOSIS — R2681 Unsteadiness on feet: Secondary | ICD-10-CM | POA: Diagnosis not present

## 2015-03-20 DIAGNOSIS — M353 Polymyalgia rheumatica: Principal | ICD-10-CM

## 2015-03-20 DIAGNOSIS — D649 Anemia, unspecified: Secondary | ICD-10-CM | POA: Diagnosis not present

## 2015-03-20 DIAGNOSIS — R531 Weakness: Secondary | ICD-10-CM | POA: Diagnosis not present

## 2015-03-20 DIAGNOSIS — M259 Joint disorder, unspecified: Secondary | ICD-10-CM | POA: Diagnosis not present

## 2015-03-20 DIAGNOSIS — R6510 Systemic inflammatory response syndrome (SIRS) of non-infectious origin without acute organ dysfunction: Secondary | ICD-10-CM | POA: Diagnosis not present

## 2015-03-20 DIAGNOSIS — N390 Urinary tract infection, site not specified: Secondary | ICD-10-CM

## 2015-03-20 DIAGNOSIS — A419 Sepsis, unspecified organism: Secondary | ICD-10-CM | POA: Diagnosis not present

## 2015-03-20 DIAGNOSIS — M720 Palmar fascial fibromatosis [Dupuytren]: Secondary | ICD-10-CM | POA: Diagnosis not present

## 2015-03-20 DIAGNOSIS — R278 Other lack of coordination: Secondary | ICD-10-CM | POA: Diagnosis not present

## 2015-03-20 DIAGNOSIS — I739 Peripheral vascular disease, unspecified: Secondary | ICD-10-CM | POA: Diagnosis not present

## 2015-03-20 DIAGNOSIS — M1990 Unspecified osteoarthritis, unspecified site: Secondary | ICD-10-CM | POA: Diagnosis not present

## 2015-03-20 DIAGNOSIS — R29898 Other symptoms and signs involving the musculoskeletal system: Secondary | ICD-10-CM | POA: Diagnosis not present

## 2015-03-20 DIAGNOSIS — Z853 Personal history of malignant neoplasm of breast: Secondary | ICD-10-CM

## 2015-03-20 LAB — RHEUMATOID FACTOR: RHEUMATOID FACTOR: 17 [IU]/mL — AB (ref 0.0–13.9)

## 2015-03-20 LAB — CBC
HEMATOCRIT: 31.8 % — AB (ref 36.0–46.0)
HEMOGLOBIN: 9.7 g/dL — AB (ref 12.0–15.0)
MCH: 24.4 pg — AB (ref 26.0–34.0)
MCHC: 30.5 g/dL (ref 30.0–36.0)
MCV: 80.1 fL (ref 78.0–100.0)
Platelets: 591 10*3/uL — ABNORMAL HIGH (ref 150–400)
RBC: 3.97 MIL/uL (ref 3.87–5.11)
RDW: 16.2 % — AB (ref 11.5–15.5)
WBC: 6.1 10*3/uL (ref 4.0–10.5)

## 2015-03-20 LAB — C3 COMPLEMENT: C3 COMPLEMENT: 149 mg/dL (ref 82–167)

## 2015-03-20 LAB — INFLUENZA PANEL BY PCR (TYPE A & B)
H1N1 flu by pcr: NOT DETECTED
INFLBPCR: NEGATIVE
Influenza A By PCR: NEGATIVE

## 2015-03-20 LAB — IRON AND TIBC
IRON: 17 ug/dL — AB (ref 28–170)
SATURATION RATIOS: 11 % (ref 10.4–31.8)
TIBC: 150 ug/dL — AB (ref 250–450)
UIBC: 133 ug/dL

## 2015-03-20 LAB — ALDOLASE: ALDOLASE: 5.2 U/L (ref 3.3–10.3)

## 2015-03-20 LAB — LACTATE DEHYDROGENASE: LDH: 87 U/L — AB (ref 98–192)

## 2015-03-20 LAB — C4 COMPLEMENT: Complement C4, Body Fluid: 31 mg/dL (ref 14–44)

## 2015-03-20 LAB — CBC AND DIFFERENTIAL: WBC: 6.1 10*3/mL

## 2015-03-20 LAB — ANTINUCLEAR ANTIBODIES, IFA: ANTINUCLEAR ANTIBODIES, IFA: NEGATIVE

## 2015-03-20 LAB — FERRITIN: Ferritin: 744 ng/mL — ABNORMAL HIGH (ref 11–307)

## 2015-03-20 MED ORDER — PREDNISONE 10 MG PO TABS: 10.0000 mg | ORAL_TABLET | Freq: Two times a day (BID) | ORAL | Status: DC

## 2015-03-20 MED ORDER — ACETAMINOPHEN 325 MG PO TABS: 650.0000 mg | ORAL_TABLET | Freq: Four times a day (QID) | ORAL | Status: DC | PRN

## 2015-03-20 MED ORDER — PREDNISONE 5 MG PO TABS
10.0000 mg | ORAL_TABLET | Freq: Two times a day (BID) | ORAL | Status: DC
  Administered 2015-03-20: 10 mg via ORAL
  Filled 2015-03-20: qty 2

## 2015-03-20 MED ORDER — CEFUROXIME AXETIL 500 MG PO TABS
500.0000 mg | ORAL_TABLET | Freq: Two times a day (BID) | ORAL | Status: DC
  Filled 2015-03-20 (×2): qty 1

## 2015-03-20 MED ORDER — CEFUROXIME AXETIL 500 MG PO TABS: 500.0000 mg | ORAL_TABLET | Freq: Two times a day (BID) | ORAL | Status: DC

## 2015-03-20 NOTE — Clinical Social Work Placement (Signed)
   CLINICAL SOCIAL WORK PLACEMENT  NOTE  Date:  03/20/2015  Patient Details  Name: Rachael [name] MRN: [id number] Date of Birth: Jan 15, 1933  Clinical Social Work is seeking post-discharge placement for this patient at the [place] level of care (*CSW will initial, date and re-position this form in  chart as items are completed):  Yes   Patient/family provided with [place] Work Department's list of facilities offering this level of care within the geographic area requested by the patient (or if unable, by the patient's family).  Yes   Patient/family informed of their freedom to choose among providers that offer the needed level of care, that participate in Medicare, Medicaid or managed care program needed by the patient, have an available bed and are willing to accept the patient.  Yes   Patient/family informed of [facility]'s ownership interest in [facility] and [facility], as well as of the fact that they are under no obligation to receive care at these facilities.  PASRR submitted to EDS on       PASRR number received on       Existing PASRR number confirmed on 03/19/15     FL2 transmitted to all facilities in geographic area requested by pt/family on 03/19/15     FL2 transmitted to all facilities within larger geographic area on       Patient informed that his/her managed care company has contracts with or will negotiate with certain facilities, including the following:        Yes   Patient/family informed of bed offers received.  Patient chooses bed at [facility]     Physician recommends and patient chooses bed at      Patient to be transferred to [facility] on 03/20/15.  Patient to be transferred to facility by pt daughter via private vehicle     Patient family notified on 03/20/15 of transfer.  Name of family member notified:  pt and pt daughter at bedside     PHYSICIAN Please sign FL2     Additional Comment:     _______________________________________________ Ladell Pier, LCSW 03/20/2015, 4:28 PM

## 2015-03-20 NOTE — Progress Notes (Signed)
Physical Therapy Treatment Patient Details Name: Rachael [name] MRN: [id number] DOB: 1933/05/28 Today's Date: 03/20/2015    History of Present Illness Rachael [name] is a 82 y.o. female who presents to the ED 03/17/15 with acute on chronic weakness. She has been having generalized muscle weakness for several months due to steroid myopathy after being given prednisone    PT Comments    The patient is improving very well. Plans SNF rehab today.   Follow Up Recommendations  SNF;Supervision/Assistance - 24 hour     Equipment Recommendations  None recommended by PT    Recommendations for Other Services       Precautions / Restrictions Precautions Precautions: Fall    Mobility  Bed Mobility Overal bed mobility: Independent Bed Mobility: Supine to Sit;Sit to Supine     Supine to sit: Mod assist;HOB elevated Sit to supine: Mod assist   General bed mobility comments: extra time, weight shifting of pelvis to scoot  to edge withou UE use due to hand pain.  Transfers Overall transfer level: Needs assistance Equipment used: Rolling walker (2 wheeled) Transfers: Sit to/from Stand Sit to Stand: Min assist;From elevated surface Stand pivot transfers: Mod assist       General transfer comment: cues for safety, pushes with legs, more assist from lower surface  Ambulation/Gait Ambulation/Gait assistance: Min assist;+2 safety/equipment Ambulation Distance (Feet): 165 Feet Assistive device: Rolling walker (2 wheeled) Gait Pattern/deviations: Step-through pattern     General Gait Details: noted tremurs of trunk, able to lightly grip the RW. much improved with amv=bulation and mobility.   Stairs            Wheelchair Mobility    Modified Rankin (Stroke Patients Only)       Balance                                    Cognition Arousal/Alertness: Awake/alert                          Exercises      General Comments        Pertinent  Vitals/Pain Pain Assessment: Faces Faces Pain Scale: Hurts little more Pain Location: hands Pain Descriptors / Indicators: Tightness;Sore Pain Intervention(s): Limited activity within patient's tolerance;Monitored during session    Home Living                      Prior Function            PT Goals (current goals can now be found in the care plan section) Progress towards PT goals: Progressing toward goals    Frequency       PT Plan Current plan remains appropriate    Co-evaluation PT/OT/SLP Co-Evaluation/Treatment: Yes Reason for Co-Treatment: For patient/therapist safety PT goals addressed during session: Mobility/safety with mobility OT goals addressed during session: ADL's and self-care     End of Session Equipment Utilized During Treatment: Gait belt Activity Tolerance: Patient tolerated treatment well;Patient limited by pain;Patient limited by fatigue Patient left: in bed;with call bell/phone within reach;with bed alarm set;with family/visitor present     Time: [id number] PT Time Calculation (min) (ACUTE ONLY): 24 min  Charges:  $Gait Training: 8-22 mins                    G Codes:      Claretha Cooper 03/20/2015,  3:15 PM Tresa Endo PT [phone]

## 2015-03-20 NOTE — Progress Notes (Signed)
Subjective: Has been started on steroids and feels significant improvement.   Exam: Filed Vitals:   03/19/15 2126 03/20/15 0633  BP: 129/53 119/56  Pulse: 57 51  Temp: 97.7 F (36.5 C) 97.3 F (36.3 C)  Resp: 16 16        Gen: In bed, NAD Mental Status: Patient is awake, alert, oriented to person, place, month, year, and situation. Patient is able to give a clear and coherent history. No signs of aphasia or neglect Cranial Nerves: II: Visual Fields are full. Pupils are equal, round, and reactive to light.  III,IV, VI: EOMI without ptosis or diploplia.  V: Facial sensation is symmetric to temperature VII: Facial movement is symmetric.  VIII: hearing is intact to voice X: Uvula elevates symmetrically XI: Shoulder shrug is symmetric. XII: tongue is midline without atrophy or fasciculations.  Motor: She has improved movement in both arms but still limited due to arthralgias and joint inflammation of PCP and MCP. In her legs, she is 4/5 bilaterally.  Sensory: Sensation is Diminished in a stocking distribution to just above the ankles bilaterally  Deep Tendon Reflexes: 1+ in the biceps and patellae On the right,1+ on the left Cerebellar: Unable to perform due to pain  Pertinent Labs: RF 17 ANA pending   MRI: IMPRESSION: 1. Stable and negative for age noncontrast MRI appearance the brain compared to 2013. 2. Questionable mild diffuse abnormal paraspinal muscle signal such as due to myositis. No appreciable muscle atrophy when compared to 2013. 3. Otherwise no significant change in the MRI appearance of the cervical spine since 2013. No spinal stenosis. No visualized spinal cord abnormality.     Impression: At this time Differential is RF versus PMR.  Primary team has called rheumatology and setting up appointment as out patient. Neurology will S/O. Discussed with Primary team.    Etta Quill PA-C [facility] [phone]    03/20/2015,  9:06 AM

## 2015-03-20 NOTE — Progress Notes (Signed)
Rachael [name]   DOB:[birth year]   UJ#:[id number]   NFA#:[id number]  Subjective: [name] tells me she got so weak she ahd to be carried by her children; after a dose of solumedrol yesterday (which she was not aware of receiving) she tells me she is feeling much better. Denies fever, rash, weight loss, adenopathy. Daughter in room   Objective: older White woman examined in recliner Filed Vitals:   03/19/15 2126 03/20/15 0633  BP: 129/53 119/56  Pulse: 57 51  Temp: 97.7 F (36.5 C) 97.3 F (36.3 C)  Resp: 16 16    Body mass index is 31.31 kg/(m^2).  Intake/Output Summary (Last 24 hours) at 03/20/15 0800 Last data filed at 03/19/15 2019  Gross per 24 hour  Intake 1258.75 ml  Output   1640 ml  Net -381.25 ml     Sclerae unicteric  No peripheral adenopathy  Lungs clear -- auscltated anterolaterally  Heart regular rate and rhythm  Abdomen benign  MSK shows diffuse joint swelling bilateral fingers  Neuro nonfocal  Breast exam: deferred  CBG (last 3)  No results for input(s): GLUCAP in the last 72 hours.  Erythrocyte Sedimentation Rate     Component Value Date/Time   ESRSEDRATE 98* 03/18/2015 2328     Labs:  Lab Results  Component Value Date   WBC 6.1 03/20/2015   HGB 9.7* 03/20/2015   HCT 31.8* 03/20/2015   MCV 80.1 03/20/2015   PLT 591* 03/20/2015   NEUTROABS 11.4* 03/17/2015    @LASTCHEMISTRY @  Urine Studies No results for input(s): UHGB, CRYS in the last 72 hours.  Invalid input(s): UACOL, UAPR, USPG, UPH, UTP, UGL, UKET, UBIL, UNIT, UROB, ULEU, UEPI, UWBC, URBC, UBAC, CAST, UCOM, BILUA  Basic Metabolic Panel:  Recent Labs Lab 03/17/15 2302 03/19/15 0340  NA 134* 143  K 3.8 3.6  CL 99* 107  CO2 25 25  GLUCOSE 120* 95  BUN 15 11  CREATININE 0.77 0.63  CALCIUM 9.2 9.1   GFR Estimated Creatinine Clearance: 67.1 mL/min (by C-G formula based on Cr of 0.63). Liver Function Tests:  Recent Labs Lab 03/17/15 2302  AST 16  ALT 11*  ALKPHOS 65  BILITOT  0.8  PROT 6.3*  ALBUMIN 2.6*   No results for input(s): LIPASE, AMYLASE in the last 168 hours. No results for input(s): AMMONIA in the last 168 hours. Coagulation profile No results for input(s): INR, PROTIME in the last 168 hours.  CBC:  Recent Labs Lab 03/17/15 2302 03/19/15 0340 03/20/15 0325  WBC 14.8* 10.2 6.1  NEUTROABS 11.4*  --   --   HGB 9.4* 8.5* 9.7*  HCT 29.4* 27.4* 31.8*  MCV 78.2 78.5 80.1  PLT 552* 526* 591*   Cardiac Enzymes:  Recent Labs Lab 03/18/15 0006 03/18/15 2328  CKTOTAL 18* 14*   BNP: Invalid input(s): POCBNP CBG: No results for input(s): GLUCAP in the last 168 hours. D-Dimer No results for input(s): DDIMER in the last 72 hours. Hgb A1c No results for input(s): HGBA1C in the last 72 hours. Lipid Profile No results for input(s): CHOL, HDL, LDLCALC, TRIG, CHOLHDL, LDLDIRECT in the last 72 hours. Thyroid function studies  Recent Labs  03/18/15 2001  TSH 0.481   Anemia work up  Recent Labs  03/18/15 2001  VITAMINB12 989*  FOLATE 17.3   Microbiology Recent Results (from the past 240 hour(s))  Culture, blood (Routine X 2) w Reflex to ID Panel     Status: None (Preliminary result)   Collection Time: 03/17/15  10:53 PM  Result Value Ref Range Status   Specimen Description BLOOD LEFT ANTECUBITAL  Final   Special Requests BOTTLES DRAWN AEROBIC AND ANAEROBIC 5ML  Final   Culture   Final    NO GROWTH 1 DAY Performed at [facility]    Report Status PENDING  Incomplete  Culture, blood (Routine X 2) w Reflex to ID Panel     Status: None (Preliminary result)   Collection Time: 03/18/15 12:06 AM  Result Value Ref Range Status   Specimen Description BLOOD LEFT FOREARM  Final   Special Requests BOTTLES DRAWN AEROBIC AND ANAEROBIC 5ML  Final   Culture   Final    NO GROWTH 1 DAY Performed at [facility]    Report Status PENDING  Incomplete  Urine culture     Status: None   Collection Time: 03/18/15  2:57 AM  Result  Value Ref Range Status   Specimen Description URINE, CLEAN CATCH  Final   Special Requests NONE  Final   Culture   Final    MULTIPLE SPECIES PRESENT, SUGGEST RECOLLECTION Performed at [facility]    Report Status 03/19/2015 FINAL  Final      Studies:  Ct Abdomen Pelvis Wo Contrast  03/19/2015  CLINICAL DATA:  Fever, weight loss.  History of breast cancer. EXAM: CT CHEST, ABDOMEN AND PELVIS WITHOUT CONTRAST TECHNIQUE: Multidetector CT imaging of the chest, abdomen and pelvis was performed following the standard protocol without IV contrast. COMPARISON:  None. FINDINGS: CT CHEST Mediastinum/Nodes: Heart is borderline in size. Scattered coronary artery and aortic calcifications. No evidence of aortic aneurysm. No mediastinal, hilar, or axillary adenopathy. Lungs/Pleura: Dependent linear densities in the lungs could reflect atelectasis or scarring. No confluent airspace opacities or pleural effusions. Musculoskeletal: Chest wall soft tissues are unremarkable. No acute bony abnormality. CT ABDOMEN AND PELVIS Hepatobiliary: Prior cholecystectomy. No visible focal hepatic abnormality. Pancreas: Unremarkable unenhanced appearance Spleen: Unremarkable unenhanced appearance Adrenals/Urinary Tract: Low-density lesions within the kidneys bilaterally, the largest in the lower pole of the left kidney measuring up to 6 cm. These were shown to represent cysts on prior CT. The lower pole cysts has enlarged since prior study. No hydronephrosis. No renal or ureteral stones. Urinary bladder is grossly unremarkable although difficult to visualize due to beam hardening artifact from bilateral hip replacements. Stomach/Bowel: Stomach, large and small bowel unremarkable. Vascular/Lymphatic: Aorta is normal caliber.  No adenopathy Reproductive: No adnexal or pelvic masses. Other: Prior ventral hernia repair.  No free fluid or free air. Musculoskeletal: Prior bilateral hip replacements. No acute bony abnormality.  Degenerative changes in the lumbar spine. IMPRESSION: No acute findings or significant abnormality in the chest, abdomen or pelvis. Electronically Signed   By: Rolm Baptise M.D.   On: 03/19/2015 07:11   Ct Chest Wo Contrast  03/19/2015  CLINICAL DATA:  Fever, weight loss.  History of breast cancer. EXAM: CT CHEST, ABDOMEN AND PELVIS WITHOUT CONTRAST TECHNIQUE: Multidetector CT imaging of the chest, abdomen and pelvis was performed following the standard protocol without IV contrast. COMPARISON:  None. FINDINGS: CT CHEST Mediastinum/Nodes: Heart is borderline in size. Scattered coronary artery and aortic calcifications. No evidence of aortic aneurysm. No mediastinal, hilar, or axillary adenopathy. Lungs/Pleura: Dependent linear densities in the lungs could reflect atelectasis or scarring. No confluent airspace opacities or pleural effusions. Musculoskeletal: Chest wall soft tissues are unremarkable. No acute bony abnormality. CT ABDOMEN AND PELVIS Hepatobiliary: Prior cholecystectomy. No visible focal hepatic abnormality. Pancreas: Unremarkable unenhanced appearance Spleen: Unremarkable  unenhanced appearance Adrenals/Urinary Tract: Low-density lesions within the kidneys bilaterally, the largest in the lower pole of the left kidney measuring up to 6 cm. These were shown to represent cysts on prior CT. The lower pole cysts has enlarged since prior study. No hydronephrosis. No renal or ureteral stones. Urinary bladder is grossly unremarkable although difficult to visualize due to beam hardening artifact from bilateral hip replacements. Stomach/Bowel: Stomach, large and small bowel unremarkable. Vascular/Lymphatic: Aorta is normal caliber.  No adenopathy Reproductive: No adnexal or pelvic masses. Other: Prior ventral hernia repair.  No free fluid or free air. Musculoskeletal: Prior bilateral hip replacements. No acute bony abnormality. Degenerative changes in the lumbar spine. IMPRESSION: No acute findings or  significant abnormality in the chest, abdomen or pelvis. Electronically Signed   By: Rolm Baptise M.D.   On: 03/19/2015 07:11   Mr Brain Wo Contrast  03/19/2015  CLINICAL DATA:  82 year old female with weakness since taking steroids in October for arthritis. Initial encounter. Personal history of breast cancer in 2013. EXAM: MRI HEAD WITHOUT CONTRAST MRI CERVICAL SPINE WITHOUT CONTRAST TECHNIQUE: Multiplanar, multiecho pulse sequences of the brain and surrounding structures, and cervical spine, to include the craniocervical junction and cervicothoracic junction, were obtained without intravenous contrast. COMPARISON:  Cervical spine and brain MRI 06/20/2011 FINDINGS: MRI HEAD FINDINGS Major intracranial vascular flow voids are stable. Tortuous distal cervical ICAs re- demonstrated. Cerebral volume is not significantly changed since 2013. No restricted diffusion to suggest acute infarction. No midline shift, mass effect, evidence of mass lesion, ventriculomegaly, extra-axial collection or acute intracranial hemorrhage. Cervicomedullary junction and pituitary are within normal limits. [place] Cables and white matter signal is stable and within normal limits for age throughout the brain. No cortical encephalomalacia or chronic cerebral blood products identified. Visible internal auditory structures appear normal. Mastoids are clear. Trace fluid or mucosal thickening in the left sphenoid sinus, other paranasal sinuses are clear. Negative orbit and scalp soft tissues. Bone marrow signal is stable and within normal limits. MRI CERVICAL SPINE FINDINGS Study is intermittently degraded by motion artifact despite repeated imaging attempts. Cervical vertebral height and alignment is stable since 2013, with straightening of lordosis and mild anterolisthesis at C3-C4. Mild degenerative appearing right posterior endplate marrow edema at C4-C5 (series 16, image 3). No other acute osseous abnormality. Chronic ligamentous hypertrophy about  the odontoid process has not significantly changed. On STIR imaging there is questionable diffuse increased STIR signal in the posterior paraspinal muscles (series 16). No appreciable muscle atrophy since 2013. C2-C3: Stable mild facet and uncovertebral hypertrophy. Stable mild left C3 foraminal stenosis. C3-C4: Chronic anterolisthesis with moderate to severe left facet hypertrophy. No spinal stenosis. Stable severe left C4 foraminal stenosis. C4-C5: Chronic anterior eccentric disc osteophyte complex. No spinal stenosis. Uncovertebral hypertrophy. Mild bilateral C5 foraminal stenosis is stable to slightly increased. C5-C6: Chronic disc space loss and anterior eccentric disc osteophyte complex. No spinal stenosis. Mild to moderate C6 foraminal stenosis greater on the right appears stable. C6-C7: Stable mostly anterior and lateral disc osteophyte complex. No spinal or foraminal stenosis. C7-T1: Moderate facet hypertrophy greater on the left has not significantly changed. No spinal stenosis. Moderate left C8 foraminal stenosis is stable. No upper thoracic spinal stenosis. Spinal cord signal is within normal limits at all visualized levels. IMPRESSION: 1. Stable and negative for age noncontrast MRI appearance the brain compared to 2013. 2. Questionable mild diffuse abnormal paraspinal muscle signal such as due to myositis. No appreciable muscle atrophy when compared to 2013. 3. Otherwise no significant  change in the MRI appearance of the cervical spine since 2013. No spinal stenosis. No visualized spinal cord abnormality. Electronically Signed   By: Genevie Ann M.D.   On: 03/19/2015 12:32   Mr Cervical Spine Wo Contrast  03/19/2015  CLINICAL DATA:  82 year old female with weakness since taking steroids in October for arthritis. Initial encounter. Personal history of breast cancer in 2013. EXAM: MRI HEAD WITHOUT CONTRAST MRI CERVICAL SPINE WITHOUT CONTRAST TECHNIQUE: Multiplanar, multiecho pulse sequences of the brain and  surrounding structures, and cervical spine, to include the craniocervical junction and cervicothoracic junction, were obtained without intravenous contrast. COMPARISON:  Cervical spine and brain MRI 06/20/2011 FINDINGS: MRI HEAD FINDINGS Major intracranial vascular flow voids are stable. Tortuous distal cervical ICAs re- demonstrated. Cerebral volume is not significantly changed since 2013. No restricted diffusion to suggest acute infarction. No midline shift, mass effect, evidence of mass lesion, ventriculomegaly, extra-axial collection or acute intracranial hemorrhage. Cervicomedullary junction and pituitary are within normal limits. [place] Cables and white matter signal is stable and within normal limits for age throughout the brain. No cortical encephalomalacia or chronic cerebral blood products identified. Visible internal auditory structures appear normal. Mastoids are clear. Trace fluid or mucosal thickening in the left sphenoid sinus, other paranasal sinuses are clear. Negative orbit and scalp soft tissues. Bone marrow signal is stable and within normal limits. MRI CERVICAL SPINE FINDINGS Study is intermittently degraded by motion artifact despite repeated imaging attempts. Cervical vertebral height and alignment is stable since 2013, with straightening of lordosis and mild anterolisthesis at C3-C4. Mild degenerative appearing right posterior endplate marrow edema at C4-C5 (series 16, image 3). No other acute osseous abnormality. Chronic ligamentous hypertrophy about the odontoid process has not significantly changed. On STIR imaging there is questionable diffuse increased STIR signal in the posterior paraspinal muscles (series 16). No appreciable muscle atrophy since 2013. C2-C3: Stable mild facet and uncovertebral hypertrophy. Stable mild left C3 foraminal stenosis. C3-C4: Chronic anterolisthesis with moderate to severe left facet hypertrophy. No spinal stenosis. Stable severe left C4 foraminal stenosis. C4-C5:  Chronic anterior eccentric disc osteophyte complex. No spinal stenosis. Uncovertebral hypertrophy. Mild bilateral C5 foraminal stenosis is stable to slightly increased. C5-C6: Chronic disc space loss and anterior eccentric disc osteophyte complex. No spinal stenosis. Mild to moderate C6 foraminal stenosis greater on the right appears stable. C6-C7: Stable mostly anterior and lateral disc osteophyte complex. No spinal or foraminal stenosis. C7-T1: Moderate facet hypertrophy greater on the left has not significantly changed. No spinal stenosis. Moderate left C8 foraminal stenosis is stable. No upper thoracic spinal stenosis. Spinal cord signal is within normal limits at all visualized levels. IMPRESSION: 1. Stable and negative for age noncontrast MRI appearance the brain compared to 2013. 2. Questionable mild diffuse abnormal paraspinal muscle signal such as due to myositis. No appreciable muscle atrophy when compared to 2013. 3. Otherwise no significant change in the MRI appearance of the cervical spine since 2013. No spinal stenosis. No visualized spinal cord abnormality. Electronically Signed   By: Genevie Ann M.D.   On: 03/19/2015 12:32    Assessment: 82 y.o.  woman admitted with generalized weakness, joint swelling, elevated ESR, mild elevation RF  (1) s/p Right lumpectomy and sentinel lymph node sampling 06/16/2011 for a T1c N0, stage IA invasive lobular breast cancer, grade 1, strongly estrogen and progesterone positive, with no HER-2 amplification, and an Mib-1 of 12%  (2) the patient met with radiation oncology 07/14/2011, but opted against adjuvant radiation  (3) started anastrozole July 2013,  briefly discontinued because of side effects but resumed October 2013. Stopped October 2016.  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 other genes tested were normal and included APC, ATM, BARD1, BMPR1A, BRCA1, BRCA2, BRIP1, CDH1, CHEK2, EPCAM,  MLH1, MRE11A, MSH6, MUTYH, NBN, PALB2, PMS2, PTEN, RAD50, RAD51C, SMAD4, STK11 and TP53    Plan: I don't believe we are dealing with a paraneoplastic syndrome due to the patient's breast cancer and there is no evidence in extensive studies this admission to suggest disease progression or recurrence.   I think most likely we are dealing with polymyalgia rheumatica and patient should have a rapid (1-3d) response to steroids. Dr Algis Liming as usual is ahead of me and started steroids yesterday-- patient tells me she is feeling better today than she has in weeks, though she still has significant inflammation. Consult with rheumatology (Dr Michela Pitcher) in process.  Anemia should improve as well but given drop in MCV (not explainable by PMR) will check iron studies  Oriented patient to the need for prolongued low-dose steroids and the need for slow tapers. Reassured her this is not cancer related.  She has an appointment with me 04/15/2015. Will sign off at this point.  Appreciate your excellent care to this patient!  Chauncey Cruel, MD 03/20/2015  8:00 AM Medical Oncology and Hematology [facility] [address] [place], [place] [address] Tel. [phone]    Fax. [phone]

## 2015-03-20 NOTE — Progress Notes (Signed)
CSW continuing to follow.   CSW received notification from MD that pt medically ready for discharge today. [facility] could not accept pt because pt would not have 24 hour support following discharge from [facility].   Pt was hopeful for [facility], but CSW spoke with [facility] today and facility does not have a female bed available today.  CSW met with pt at bedside to discuss that [facility] is full and offered other SNF bed offers.   Pt chooses [facility] as it is a sister facility to [facility] and is convenient to her home.   CSW confirmed with [facility] that facility has bed available for pt for today. CSW notified pt at bedside and pt agreeable to transfer to [facility] today.  CSW contacted pt insurance, [employer] and notified of acceptance of [facility]. CSW received insurance authorization from [facility] Josem Kaufmann #: [id number]).   CSW to facilitate pt discharge needs this afternoon.  Alison Murray, MSW, [place] [facility] [phone]

## 2015-03-20 NOTE — Progress Notes (Signed)
Pt for discharge to [facility].  CSW facilitated pt discharge needs including contacting facility, faxing pt discharge information via epic hub, discussing with pt and pt daughter at bedside, providing RN phone number to call report, and providing discharge packet to pt and pt daughter as pt daughter plans to transport via private vehicle.   Pt expressed that she is feeling better and made improvements with PT today. Pt eager to go to rehab to get stronger to return home.   No further social work needs identified at this time.  CSW signing off.   Alison Murray, MSW, [place] [facility] [phone]

## 2015-03-20 NOTE — Discharge Summary (Signed)
Physician Discharge Summary  Rachael [name]  GYI:[id number]  DOB: 06/09/1933  DOA: 03/17/2015  PCP: No PCP Per Patient  Outpatient Specialists: Endocrinology: Dr. Augusto Gamble in [place], [place] Rheumatology: Dr. Sabino Niemann  Admit date: 03/17/2015 Discharge date: 03/20/2015  Time spent: Greater than 30 minutes  Recommendations for Outpatient Follow-up:  1. M.D. at SNF in 3-5 days with repeat labs (CBC). Please follow final blood culture & aldolase results that were sent from the hospital. 2. Dr. Sabino Niemann, Rheumatology: SNF to arrange follow-up appointment ASAP, possibly within the next week.  Discharge Diagnoses:  Principal Problem:   Sepsis secondary to UTI [facility]) Active Problems:   SIRS (systemic inflammatory response syndrome) (HCC)   Weakness of both lower extremities   Arthritis   Discharge Condition: Improved & Stable  Diet recommendation: Heart healthy diet.  Filed Weights   03/18/15 1048  Weight: 92.1 kg (203 lb 0.7 oz)    History of present illness:  82 y/o female with PMH of HLD, hypothyroid, presented to ED with h/o progressive LE weakness of 3-4 months duration (no joint pains in LE's) and new onset/~ 2 weeks of b/l UE joint pains, swelling and difficulty using hands to function. Strong smelling urine and low grade fevers. Has been on 3 short courses on steroids for various reasons in last 3-4 months. Seen by OP Rheumatology (Dr. Meda Coffee) in Nov and initially told NOT to have RA, has an appointment to see new Rheumatologist Dr. Sabino Niemann on 03/20/15. She was admitted for presumed UTI, lower extremity weakness and pain and difficulty using her upper extremities.  Hospital Course:   SIRS - SIRS present on admission with fever, leukocytosis and positive urine microscopy. Chest x-ray negative. Unclear etiology. - Started empirically on IV Rocephin for presumed UTI.. - Blood cultures 2: Negative to date. Urine culture: Suggestive of contamination and hence not helpful. - Sepsis  physiology resolved. - Flu panel PCR: Negative. - Patient had strong smell to urine and urine microscopy was suspicious for UTI. Hence we'll complete treatment for UTI. She has thus far received 3 days of IV Rocephin. She will be transitioned to oral Ceftin to complete a total 7 days treatment.  Lower extremity weakness - Unclear etiology.? Deconditioning. - Neurology consultation appreciated. - MRI brain without acute findings. MRI C-spine: Questionable mild diffuse abnormal paraspinal muscle signal-unclear etiology. Discussed with neurology on 3/9: No further workup or management from neurology standpoint. - CK normal. - Steroid induced myopathy seems less likely in that she has not used steroids for prolonged periods. - As per rehabilitation recommendations, SNF when medically stable.  Upper extremity joint pain and? Arthritis of wrists-polymyalgia rheumatica versus rheumatoid arthritis versus other etiologies. - Unclear etiology.? Rheumatoid arthritis. - ESR 98. CRP 20.3. - Uric acid: 5, CK 14. - Rheumatoid factor: 17, C3 normal, C4 normal, aldolase-pending, ANA: Negative - Rheumatoid factor on 11/05/14:16  - Patient had been seen by rheumatology in November 2016 and told that she did not have rheumatoid arthritis. - Discussed with Timmie Foerster, Rheumatology on 03/19/15 and she suspects that patient may still have rheumatoid arthritis and recommends trial of her dose of IV Solu-Medrol 125 MG Times one and if has good response then prednisone 10 MG twice a day until outpatient follow-up with her in a week's time. Discussed extensively with patient and daughter who had expressed concern regarding taking steroids. She does not have a true allergy and has consented to taking it. - Patient received a dose of IV Solu-Medrol on 03/19/15  with good response. Patient indicates that her upper extremity symptoms are at least 60% better. Discussed again with Dr. Dossie Der who feels that she may have PMR versus  RA and recommends continued prednisone at 10 MG twice a day until outpatient follow-up with her. - Unclear etiology of abnormal MRI C-spine-? Questionable mild diffuse abnormal paraspinal muscle signal-discussed with neurology who did not recommend any further workup or management. As per patient and daughter, patient has had chronic neck issues.? Related to possible underlying rheumatology disorder versus other etiology-outpatient follow-up.  Weight loss - Unclear etiology. - Given known history of breast cancer in remission, concern was for recurrent malignancy. - CT chest, abdomen and pelvis are without acute findings. - Oncology follow-up appreciated: Did not believe that we are dealing with a paraneoplastic syndrome from patient's history of breast cancer. Patient has follow-up appointment on 04/15/15.  Hypothyroid - TSH normal. Continue Synthroid.  Anemia - Likely related to chronic disease. Stable. Outpatient follow-up. - Anemia panel: Iron 17, TIBC 150, saturation ratio 11, ferritin 744, folate 17.3 & B12: 989  Thrombocytosis - Possibly reactive.    Family Communication: Discussed at length with patient's daughter at bedside    Consultants:  Neurology  Oncology  Discussed with patient's rheumatologist  Procedures:  None    Discharge Exam:  Complaints: Patient states that she feels much better with improvement in pain and swelling of her upper extremities and able to move her hands and fingers much better and is able to function better. She states that her symptoms are at least 60% better  Filed Vitals:   03/19/15 0509 03/19/15 1327 03/19/15 2126 03/20/15 0633  BP: 131/61 138/61 129/53 119/56  Pulse: 61 57 57 51  Temp: 98.1 F (36.7 C) 97.4 F (36.3 C) 97.7 F (36.5 C) 97.3 F (36.3 C)  TempSrc: Oral Oral Oral Oral  Resp: 14 16 16 16   Height:      Weight:      SpO2: 97% 98% 98% 97%    General exam: Pleasant elderly female sitting comfortably in chair  this morning. Respiratory system: Clear. No increased work of breathing. Cardiovascular system: S1 & S2 heard, RRR. No JVD, murmurs, gallops, clicks or pedal edema. Gastrointestinal system: Abdomen is nondistended, soft and nontender. Normal bowel sounds heard. Central nervous system: Alert and oriented. No focal neurological deficits. Extremities: Symmetric 5 x 5 power. On 3/8, Mild increased warmth and tenderness of right wrist. Mildly painful range of movements in both wrists. No arthritic changes in bilateral elbows or shoulders. Boggy swelling of small joints of her hand without tenderness or other acute findings. All these findings are better compared to yesterday.   Discharge Instructions      Discharge Instructions    Call MD for:  difficulty breathing, headache or visual disturbances    Complete by:  As directed      Call MD for:  extreme fatigue    Complete by:  As directed      Call MD for:  persistant dizziness or light-headedness    Complete by:  As directed      Call MD for:  persistant nausea and vomiting    Complete by:  As directed      Call MD for:  redness, tenderness, or signs of infection (pain, swelling, redness, odor or green/yellow discharge around incision site)    Complete by:  As directed      Call MD for:  severe uncontrolled pain    Complete by:  As  directed      Call MD for:  temperature >100.4    Complete by:  As directed      Diet - low sodium heart healthy    Complete by:  As directed      Increase activity slowly    Complete by:  As directed             Medication List    TAKE these medications        acetaminophen 325 MG tablet  Commonly known as:  TYLENOL  Take 2 tablets (650 mg total) by mouth every 6 (six) hours as needed for mild pain, moderate pain, fever or headache.     B-12 PO  Take 1 tablet by mouth daily. Reported on 01/27/2015     cefUROXime 500 MG tablet  Commonly known as:  CEFTIN  Take 1 tablet (500 mg total) by mouth 2  (two) times daily with a meal. Discontinue after 03/23/15 doses.     cholecalciferol 1000 units tablet  Commonly known as:  VITAMIN D  Take 2,000 Units by mouth daily. Reported on 01/27/2015     REPLETE PO  Take 1 tablet by mouth daily. Reported on 02/05/2015     JUICE PLUS FIBRE Liqd  Take 4 capsules by mouth 3 (three) times daily. 4 vegetable capsules with breakfast, 4 fruit capsules with lunch and 4 berry capsules with dinner.     levothyroxine 137 MCG tablet  Commonly known as:  SYNTHROID, LEVOTHROID  Take 137 mcg by mouth daily.     predniSONE 10 MG tablet  Commonly known as:  DELTASONE  Take 1 tablet (10 mg total) by mouth 2 (two) times daily with a meal.         Get Medicines reviewed and adjusted: Please take all your medications with you for your next visit with your Primary MD  Please request your Primary MD to go over all hospital tests and procedure/radiological results at the follow up. Please ask your Primary MD to get all Hospital records sent to his/her office.  If you experience worsening of your admission symptoms, develop shortness of breath, life threatening emergency, suicidal or homicidal thoughts you must seek medical attention immediately by calling 911 or calling your MD immediately if symptoms less severe.  You must read complete instructions/literature along with all the possible adverse reactions/side effects for all the Medicines you take and that have been prescribed to you. Take any new Medicines after you have completely understood and accept all the possible adverse reactions/side effects.   Do not drive when taking pain medications.   Do not take more than prescribed Pain, Sleep and Anxiety Medications  Special Instructions: If you have smoked or chewed Tobacco in the last 2 yrs please stop smoking, stop any regular Alcohol and or any Recreational drug use.  Wear Seat belts while driving.  Please note  You were cared for by a hospitalist  during your hospital stay. Once you are discharged, your primary care physician will handle any further medical issues. Please note that NO REFILLS for any discharge medications will be authorized once you are discharged, as it is imperative that you return to your primary care physician (or establish a relationship with a primary care physician if you do not have one) for your aftercare needs so that they can reassess your need for medications and monitor your lab values.    The results of significant diagnostics from this hospitalization (including imaging, microbiology, ancillary and laboratory)  are listed below for reference.    Significant Diagnostic Studies: Ct Abdomen Pelvis Wo Contrast  03/19/2015  CLINICAL DATA:  Fever, weight loss.  History of breast cancer. EXAM: CT CHEST, ABDOMEN AND PELVIS WITHOUT CONTRAST TECHNIQUE: Multidetector CT imaging of the chest, abdomen and pelvis was performed following the standard protocol without IV contrast. COMPARISON:  None. FINDINGS: CT CHEST Mediastinum/Nodes: Heart is borderline in size. Scattered coronary artery and aortic calcifications. No evidence of aortic aneurysm. No mediastinal, hilar, or axillary adenopathy. Lungs/Pleura: Dependent linear densities in the lungs could reflect atelectasis or scarring. No confluent airspace opacities or pleural effusions. Musculoskeletal: Chest wall soft tissues are unremarkable. No acute bony abnormality. CT ABDOMEN AND PELVIS Hepatobiliary: Prior cholecystectomy. No visible focal hepatic abnormality. Pancreas: Unremarkable unenhanced appearance Spleen: Unremarkable unenhanced appearance Adrenals/Urinary Tract: Low-density lesions within the kidneys bilaterally, the largest in the lower pole of the left kidney measuring up to 6 cm. These were shown to represent cysts on prior CT. The lower pole cysts has enlarged since prior study. No hydronephrosis. No renal or ureteral stones. Urinary bladder is grossly unremarkable  although difficult to visualize due to beam hardening artifact from bilateral hip replacements. Stomach/Bowel: Stomach, large and small bowel unremarkable. Vascular/Lymphatic: Aorta is normal caliber.  No adenopathy Reproductive: No adnexal or pelvic masses. Other: Prior ventral hernia repair.  No free fluid or free air. Musculoskeletal: Prior bilateral hip replacements. No acute bony abnormality. Degenerative changes in the lumbar spine. IMPRESSION: No acute findings or significant abnormality in the chest, abdomen or pelvis. Electronically Signed   By: Rolm Baptise M.D.   On: 03/19/2015 07:11   Dg Chest 2 View  03/18/2015  CLINICAL DATA:  Progressive weakness over several weeks, weight loss. EXAM: CHEST  2 VIEW COMPARISON:  Radiographs 08/07/2010 FINDINGS: Mild cardiomegaly, new from prior exam. Suspect small left pleural effusion with basilar atelectasis. No pulmonary edema. No consolidation to suggest pneumonia. No pneumothorax. Chronic change about the right shoulder. Age related degenerative change in the spine. Surgical clips in the right axilla. IMPRESSION: Mild cardiomegaly, new from 2012. Small left pleural effusion with adjacent atelectasis. Electronically Signed   By: Jeb Levering M.D.   On: 03/18/2015 00:03   Ct Chest Wo Contrast  03/19/2015  CLINICAL DATA:  Fever, weight loss.  History of breast cancer. EXAM: CT CHEST, ABDOMEN AND PELVIS WITHOUT CONTRAST TECHNIQUE: Multidetector CT imaging of the chest, abdomen and pelvis was performed following the standard protocol without IV contrast. COMPARISON:  None. FINDINGS: CT CHEST Mediastinum/Nodes: Heart is borderline in size. Scattered coronary artery and aortic calcifications. No evidence of aortic aneurysm. No mediastinal, hilar, or axillary adenopathy. Lungs/Pleura: Dependent linear densities in the lungs could reflect atelectasis or scarring. No confluent airspace opacities or pleural effusions. Musculoskeletal: Chest wall soft tissues are  unremarkable. No acute bony abnormality. CT ABDOMEN AND PELVIS Hepatobiliary: Prior cholecystectomy. No visible focal hepatic abnormality. Pancreas: Unremarkable unenhanced appearance Spleen: Unremarkable unenhanced appearance Adrenals/Urinary Tract: Low-density lesions within the kidneys bilaterally, the largest in the lower pole of the left kidney measuring up to 6 cm. These were shown to represent cysts on prior CT. The lower pole cysts has enlarged since prior study. No hydronephrosis. No renal or ureteral stones. Urinary bladder is grossly unremarkable although difficult to visualize due to beam hardening artifact from bilateral hip replacements. Stomach/Bowel: Stomach, large and small bowel unremarkable. Vascular/Lymphatic: Aorta is normal caliber.  No adenopathy Reproductive: No adnexal or pelvic masses. Other: Prior ventral hernia repair.  No free fluid  or free air. Musculoskeletal: Prior bilateral hip replacements. No acute bony abnormality. Degenerative changes in the lumbar spine. IMPRESSION: No acute findings or significant abnormality in the chest, abdomen or pelvis. Electronically Signed   By: Rolm Baptise M.D.   On: 03/19/2015 07:11   Mr Brain Wo Contrast  03/19/2015  CLINICAL DATA:  82 year old female with weakness since taking steroids in October for arthritis. Initial encounter. Personal history of breast cancer in 2013. EXAM: MRI HEAD WITHOUT CONTRAST MRI CERVICAL SPINE WITHOUT CONTRAST TECHNIQUE: Multiplanar, multiecho pulse sequences of the brain and surrounding structures, and cervical spine, to include the craniocervical junction and cervicothoracic junction, were obtained without intravenous contrast. COMPARISON:  Cervical spine and brain MRI 06/20/2011 FINDINGS: MRI HEAD FINDINGS Major intracranial vascular flow voids are stable. Tortuous distal cervical ICAs re- demonstrated. Cerebral volume is not significantly changed since 2013. No restricted diffusion to suggest acute infarction. No  midline shift, mass effect, evidence of mass lesion, ventriculomegaly, extra-axial collection or acute intracranial hemorrhage. Cervicomedullary junction and pituitary are within normal limits. [place] Cables and white matter signal is stable and within normal limits for age throughout the brain. No cortical encephalomalacia or chronic cerebral blood products identified. Visible internal auditory structures appear normal. Mastoids are clear. Trace fluid or mucosal thickening in the left sphenoid sinus, other paranasal sinuses are clear. Negative orbit and scalp soft tissues. Bone marrow signal is stable and within normal limits. MRI CERVICAL SPINE FINDINGS Study is intermittently degraded by motion artifact despite repeated imaging attempts. Cervical vertebral height and alignment is stable since 2013, with straightening of lordosis and mild anterolisthesis at C3-C4. Mild degenerative appearing right posterior endplate marrow edema at C4-C5 (series 16, image 3). No other acute osseous abnormality. Chronic ligamentous hypertrophy about the odontoid process has not significantly changed. On STIR imaging there is questionable diffuse increased STIR signal in the posterior paraspinal muscles (series 16). No appreciable muscle atrophy since 2013. C2-C3: Stable mild facet and uncovertebral hypertrophy. Stable mild left C3 foraminal stenosis. C3-C4: Chronic anterolisthesis with moderate to severe left facet hypertrophy. No spinal stenosis. Stable severe left C4 foraminal stenosis. C4-C5: Chronic anterior eccentric disc osteophyte complex. No spinal stenosis. Uncovertebral hypertrophy. Mild bilateral C5 foraminal stenosis is stable to slightly increased. C5-C6: Chronic disc space loss and anterior eccentric disc osteophyte complex. No spinal stenosis. Mild to moderate C6 foraminal stenosis greater on the right appears stable. C6-C7: Stable mostly anterior and lateral disc osteophyte complex. No spinal or foraminal stenosis. C7-T1:  Moderate facet hypertrophy greater on the left has not significantly changed. No spinal stenosis. Moderate left C8 foraminal stenosis is stable. No upper thoracic spinal stenosis. Spinal cord signal is within normal limits at all visualized levels. IMPRESSION: 1. Stable and negative for age noncontrast MRI appearance the brain compared to 2013. 2. Questionable mild diffuse abnormal paraspinal muscle signal such as due to myositis. No appreciable muscle atrophy when compared to 2013. 3. Otherwise no significant change in the MRI appearance of the cervical spine since 2013. No spinal stenosis. No visualized spinal cord abnormality. Electronically Signed   By: Genevie Ann M.D.   On: 03/19/2015 12:32   Mr Cervical Spine Wo Contrast  03/19/2015  CLINICAL DATA:  81 year old female with weakness since taking steroids in October for arthritis. Initial encounter. Personal history of breast cancer in 2013. EXAM: MRI HEAD WITHOUT CONTRAST MRI CERVICAL SPINE WITHOUT CONTRAST TECHNIQUE: Multiplanar, multiecho pulse sequences of the brain and surrounding structures, and cervical spine, to include the craniocervical junction and cervicothoracic junction,  were obtained without intravenous contrast. COMPARISON:  Cervical spine and brain MRI 06/20/2011 FINDINGS: MRI HEAD FINDINGS Major intracranial vascular flow voids are stable. Tortuous distal cervical ICAs re- demonstrated. Cerebral volume is not significantly changed since 2013. No restricted diffusion to suggest acute infarction. No midline shift, mass effect, evidence of mass lesion, ventriculomegaly, extra-axial collection or acute intracranial hemorrhage. Cervicomedullary junction and pituitary are within normal limits. [place] Cables and white matter signal is stable and within normal limits for age throughout the brain. No cortical encephalomalacia or chronic cerebral blood products identified. Visible internal auditory structures appear normal. Mastoids are clear. Trace fluid or  mucosal thickening in the left sphenoid sinus, other paranasal sinuses are clear. Negative orbit and scalp soft tissues. Bone marrow signal is stable and within normal limits. MRI CERVICAL SPINE FINDINGS Study is intermittently degraded by motion artifact despite repeated imaging attempts. Cervical vertebral height and alignment is stable since 2013, with straightening of lordosis and mild anterolisthesis at C3-C4. Mild degenerative appearing right posterior endplate marrow edema at C4-C5 (series 16, image 3). No other acute osseous abnormality. Chronic ligamentous hypertrophy about the odontoid process has not significantly changed. On STIR imaging there is questionable diffuse increased STIR signal in the posterior paraspinal muscles (series 16). No appreciable muscle atrophy since 2013. C2-C3: Stable mild facet and uncovertebral hypertrophy. Stable mild left C3 foraminal stenosis. C3-C4: Chronic anterolisthesis with moderate to severe left facet hypertrophy. No spinal stenosis. Stable severe left C4 foraminal stenosis. C4-C5: Chronic anterior eccentric disc osteophyte complex. No spinal stenosis. Uncovertebral hypertrophy. Mild bilateral C5 foraminal stenosis is stable to slightly increased. C5-C6: Chronic disc space loss and anterior eccentric disc osteophyte complex. No spinal stenosis. Mild to moderate C6 foraminal stenosis greater on the right appears stable. C6-C7: Stable mostly anterior and lateral disc osteophyte complex. No spinal or foraminal stenosis. C7-T1: Moderate facet hypertrophy greater on the left has not significantly changed. No spinal stenosis. Moderate left C8 foraminal stenosis is stable. No upper thoracic spinal stenosis. Spinal cord signal is within normal limits at all visualized levels. IMPRESSION: 1. Stable and negative for age noncontrast MRI appearance the brain compared to 2013. 2. Questionable mild diffuse abnormal paraspinal muscle signal such as due to myositis. No appreciable  muscle atrophy when compared to 2013. 3. Otherwise no significant change in the MRI appearance of the cervical spine since 2013. No spinal stenosis. No visualized spinal cord abnormality. Electronically Signed   By: Genevie Ann M.D.   On: 03/19/2015 12:32    Microbiology: Recent Results (from the past 240 hour(s))  Culture, blood (Routine X 2) w Reflex to ID Panel     Status: None (Preliminary result)   Collection Time: 03/17/15 10:53 PM  Result Value Ref Range Status   Specimen Description BLOOD LEFT ANTECUBITAL  Final   Special Requests BOTTLES DRAWN AEROBIC AND ANAEROBIC 5ML  Final   Culture   Final    NO GROWTH 2 DAYS Performed at [facility]    Report Status PENDING  Incomplete  Culture, blood (Routine X 2) w Reflex to ID Panel     Status: None (Preliminary result)   Collection Time: 03/18/15 12:06 AM  Result Value Ref Range Status   Specimen Description BLOOD LEFT FOREARM  Final   Special Requests BOTTLES DRAWN AEROBIC AND ANAEROBIC 5ML  Final   Culture   Final    NO GROWTH 2 DAYS Performed at [facility]    Report Status PENDING  Incomplete  Urine culture  Status: None   Collection Time: 03/18/15  2:57 AM  Result Value Ref Range Status   Specimen Description URINE, CLEAN CATCH  Final   Special Requests NONE  Final   Culture   Final    MULTIPLE SPECIES PRESENT, SUGGEST RECOLLECTION Performed at [facility]    Report Status 03/19/2015 FINAL  Final     Labs: Basic Metabolic Panel:  Recent Labs Lab 03/17/15 2302 03/19/15 0340  NA 134* 143  K 3.8 3.6  CL 99* 107  CO2 25 25  GLUCOSE 120* 95  BUN 15 11  CREATININE 0.77 0.63  CALCIUM 9.2 9.1   Liver Function Tests:  Recent Labs Lab 03/17/15 2302  AST 16  ALT 11*  ALKPHOS 65  BILITOT 0.8  PROT 6.3*  ALBUMIN 2.6*   No results for input(s): LIPASE, AMYLASE in the last 168 hours. No results for input(s): AMMONIA in the last 168 hours. CBC:  Recent Labs Lab 03/17/15 2302  03/19/15 0340 03/20/15 0325  WBC 14.8* 10.2 6.1  NEUTROABS 11.4*  --   --   HGB 9.4* 8.5* 9.7*  HCT 29.4* 27.4* 31.8*  MCV 78.2 78.5 80.1  PLT 552* 526* 591*   Cardiac Enzymes:  Recent Labs Lab 03/18/15 0006 03/18/15 2328  CKTOTAL 18* 14*   BNP: BNP (last 3 results) No results for input(s): BNP in the last 8760 hours.  ProBNP (last 3 results) No results for input(s): PROBNP in the last 8760 hours.  CBG: No results for input(s): GLUCAP in the last 168 hours.     Signed:  Vernell Leep, MD, FACP, FHM. [facility] Pager [phone]  If 7PM-7AM, please contact night-coverage [email or web address] Password [facility] 03/20/2015, 3:33 PM

## 2015-03-20 NOTE — Progress Notes (Signed)
Occupational Therapy Treatment Patient Details Name: Rachael [name] MRN: [id number] DOB: [birth year] Today's Date: 03/20/2015    History of present illness Rachael [name] is a 82 y.o. female who presents to the ED 03/17/15 with acute on chronic weakness. She has been having generalized muscle weakness for several months due to steroid myopathy after being given prednisone   OT comments  Pt is making excellent progress.  She plans to d/c to [facility] and is looking forward to rehab  Follow Up Recommendations  SNF    Equipment Recommendations  3 in 1 bedside comode    Recommendations for Other Services      Precautions / Restrictions Precautions Precautions: Fall Restrictions Weight Bearing Restrictions: No       Mobility Bed Mobility Overal bed mobility: Independent Bed Mobility: Supine to Sit;Sit to Supine     Supine to sit: Mod assist;HOB elevated Sit to supine: Mod assist   General bed mobility comments: extra time, weight shifting of pelvis to scoot  to edge withou UE use due to hand pain.  Transfers Overall transfer level: Needs assistance Equipment used: Rolling walker (2 wheeled) Transfers: Sit to/from Stand Sit to Stand: Min assist;From elevated surface        General transfer comment: cues for UE placement    Balance                                   ADL                           Toilet Transfer: Minimal assistance; +2 for safety;BSC;RW   Toileting- Clothing Manipulation and Hygiene: Total assistance;Sit to/from stand         General ADL Comments: ambulated to bathroom with min +2 for safety.        Vision                     Perception     Praxis      Cognition   Behavior During Therapy: WFL for tasks assessed/performed Overall Cognitive Status: Within Functional Limits for tasks assessed                       Extremity/Trunk Assessment               Exercises Other Exercises Other  Exercises: performed AAROM x bil shoulders x 8 reps Other Exercises: using squeeze ball bil hands, L > R   Shoulder Instructions       General Comments      Pertinent Vitals/ Pain       Pain Assessment: Faces Faces Pain Scale: Hurts little more Pain Location: hands Pain Descriptors / Indicators: Tightness Pain Intervention(s): Limited activity within patient's tolerance;Monitored during session;Repositioned  Home Living                                          Prior Functioning/Environment              Frequency Min 2X/week     Progress Toward Goals  OT Goals(current goals can now be found in the care plan section)  Progress towards OT goals: Progressing toward goals     Plan      Co-evaluation  Reason for Co-Treatment: For patient/therapist safety PT goals addressed during session: Mobility/safety with mobility OT goals addressed during session: ADL's and self-care      End of Session     Activity Tolerance Patient tolerated treatment well   Patient Left in bed;with call bell/phone within reach;with family/visitor present;with bed alarm set   Nurse Communication          Time: [id number] OT Time Calculation (min): 33 min  Charges: OT General Charges $OT Visit: 1 Procedure OT Treatments $Self Care/Home Management : 8-22 mins  Anica Alcaraz 03/20/2015, 3:35 PM  Lesle Chris, OTR/L [phone] 03/20/2015

## 2015-03-20 NOTE — Clinical Documentation Improvement (Signed)
Hospitalist  Can the diagnosis of systemic infection be further specified?   Sepsis - specify causative organism if known  Determine if there is Severe Sepsis (Sepsis with organ dysfunction - specify), Septic Shock if present  Identify etiology of Sepsis - Device, Implant, Graft, Infusion, Abortion  Specify organ dysfunction - Respiratory Failure, Encephalopathy, Acute Kidney Failure, Pneumonia, UTI, Other (specify), Unable to Clinically Determine}  Other  Clinically Undetermined  Document any associated diagnoses/conditions.   Supporting Information: Sepsis secondary to UTI per 3/07 progress notes. SIRS present on admission with fever, leukocytosis and positive urine microscopy per 3/08 progress notes. 3/06: Lactic acid: 0.68.   Please exercise your independent, professional judgment when responding. A specific answer is not anticipated or expected.   Thank You,  [place] [phone]

## 2015-03-23 LAB — CULTURE, BLOOD (ROUTINE X 2)
Culture: NO GROWTH
Culture: NO GROWTH

## 2015-03-25 ENCOUNTER — Encounter: Payer: Self-pay | Admitting: Internal Medicine

## 2015-03-25 ENCOUNTER — Non-Acute Institutional Stay: Payer: PPO | Admitting: Internal Medicine

## 2015-03-25 DIAGNOSIS — R29898 Other symptoms and signs involving the musculoskeletal system: Secondary | ICD-10-CM

## 2015-03-25 DIAGNOSIS — E669 Obesity, unspecified: Secondary | ICD-10-CM | POA: Diagnosis not present

## 2015-03-25 DIAGNOSIS — D649 Anemia, unspecified: Secondary | ICD-10-CM | POA: Diagnosis not present

## 2015-03-25 DIAGNOSIS — N390 Urinary tract infection, site not specified: Secondary | ICD-10-CM | POA: Diagnosis not present

## 2015-03-25 DIAGNOSIS — E46 Unspecified protein-calorie malnutrition: Secondary | ICD-10-CM

## 2015-03-25 DIAGNOSIS — R634 Abnormal weight loss: Secondary | ICD-10-CM

## 2015-03-25 DIAGNOSIS — E559 Vitamin D deficiency, unspecified: Secondary | ICD-10-CM | POA: Diagnosis not present

## 2015-03-25 DIAGNOSIS — R5381 Other malaise: Secondary | ICD-10-CM

## 2015-03-25 DIAGNOSIS — R700 Elevated erythrocyte sedimentation rate: Secondary | ICD-10-CM | POA: Diagnosis not present

## 2015-03-25 DIAGNOSIS — E039 Hypothyroidism, unspecified: Secondary | ICD-10-CM

## 2015-03-25 DIAGNOSIS — M2550 Pain in unspecified joint: Secondary | ICD-10-CM | POA: Diagnosis not present

## 2015-03-25 DIAGNOSIS — M19042 Primary osteoarthritis, left hand: Secondary | ICD-10-CM | POA: Diagnosis not present

## 2015-03-25 DIAGNOSIS — R768 Other specified abnormal immunological findings in serum: Secondary | ICD-10-CM | POA: Diagnosis not present

## 2015-03-25 NOTE — Progress Notes (Signed)
  LOCATION:  [facility]  PCP: No PCP Per Patient   Code Status: Full Code  Goals of care: Advanced Directive information Advanced Directives 03/17/2015  Does patient have an advance directive? No  Type of Advance Directive -  Copy of advanced directive(s) in chart? -  Would patient like information on creating an advanced directive? No - patient declined information       Extended Emergency Contact Information Primary Emergency Contact: Baker,Torri Address: [address]          [place], [place] [address] [place] Home Phone: [phone] Relation: Daughter Secondary Emergency Contact: Salminen,Nathan  [place] Home Phone: [phone] Relation: Son   Allergies  Allergen Reactions  . Shellfish Allergy Anaphylaxis and Hives    Hives all over the body.  . Percocet [Oxycodone-Acetaminophen] Hives  . Iodinated Diagnostic Agents     PT IS NOT AWARE OF IODINE ALLERGY, PREMEDICATED FOR PRIOR PREMEDS ONLY//A.C.  . Iodine Hives  . Prednisone     nausea    Chief Complaint  Patient presents with  . New Admit To SNF    New Admission     HPI:  Patient is a 81 y.o. female seen today for short term rehabilitation post hospital admission from 03/17/15-03/20/15 with generalized extremity weakness and UTI. She was treated with antibiotics and urine culture had no significant growth. Blood culture and flu PCR were negative. She was seen by neurology for her extremity weakness. MRI brain and C-spine revealed no clear etiology. There were concern for PMR vs RA. She received iv solumedrol which improved her clinical symptom some and she is now on po prednisone until seen by rheumatology. She has PMH of hypothyroidism, hyperlipidemia, breast cancer among others. She is seen in her room today. She complaints of poor appetite that has been going on with weight loss for several months. She has weakness and pain to her arms and hands. She will see rheumatology today. She is  upset about not receiving her thyroid medication and prednisone at desired timing.   Review of Systems:  Constitutional: Negative for fever, chills, diaphoresis. Gets tired easily.  HENT: Negative for headache, congestion, nasal discharge, hearing loss, sore throat, difficulty swallowing. Eyes: Negative for blurred vision, double vision and discharge.  Respiratory: Negative for cough, shortness of breath and wheezing.   Cardiovascular: Negative for chest pain, palpitations. has leg swelling.  Gastrointestinal: Negative for heartburn, nausea, vomiting, abdominal pain, melena, diarrhea and constipation. Last bowel movement was today.  Genitourinary: Negative for dysuria and flank pain.  Musculoskeletal: Negative for back pain, fall. Positive for right neck pain.  Skin: Negative for itching, rash.  Neurological: Negative for dizziness. Positive for weakness in both arms and hands. Tightness on the skin around her feet. Psychiatric/Behavioral: Negative for depression. Has anxiety. Feels well rested this am   Past Medical History  Diagnosis Date  . Chronic venous insufficiency   . PVD (peripheral vascular disease) (HCC)     mild carotid 11/05  . Osteoarthritis     bilat hips, severe  . Obesity   . CONTACT DERMATITIS   . GLUCOSE INTOLERANCE   . VITAMIN D DEFICIENCY   . Edema     pt  states had edema of feet and legs has been on lasix per dr. to help  . CAROTID STENOSIS   . DUPUYTREN'S CONTRACTURE   . MENOPAUSAL SYNDROME   . HLD (hyperlipidemia)   . Hypothyroidism     Dr. Rizvi in [place]   endo   . Breast cancer (HCC)     R   Past Surgical History  Procedure Laterality Date  . 2d echo  11/29/03    normal LV size. normal EF   . Normal caronary arteries      per pt. 2003  . Cholecystectomy    . Inguinal hernia repair    . Tonsillectomy    . Right rotator cuff surgery    . Total hip arthroplasty  05/2008 and 10/2008    B hip - alusio  . Hernia repair      incisional hernia    . Breast biopsy  05/11/11    right breast  . Breast lumpectomy      right breast  . Back surgery  1983    lumbarectomy  . Finger surgery    . Joint replacement Bilateral     Dr. Alusio   Social History:   reports that she has never smoked. She has never used smokeless tobacco. She reports that she does not drink alcohol or use illicit drugs.  Family History  Problem Relation Age of Onset  . Coronary artery disease      family hx - female 1st degree relative <60  . Hypothyroidism      aunt   . Uterine cancer Mother 45  . Cancer Mother   . Breast cancer Daughter 44    BRCA negative (no BART)  . Cancer Daughter   . Pancreatic cancer Maternal Grandfather     diagnosed in his 70s  . Anesthesia problems Neg Hx   . Prostate cancer Maternal Uncle     diagnosed in his 70s  . Colon cancer Cousin   . Prostate cancer Paternal Uncle     diagnosed in his 50s  . Clotting disorder Maternal Grandmother   . Uterine cancer Cousin   . Leukemia Cousin   . Prostate cancer Cousin   . Heart disease Father     before age 60  . Varicose Veins Father   . Heart disease Brother     before age 60  . Cancer Sister     Medications:   Medication List       This list is accurate as of: 03/25/15  9:37 AM.  Always use your most recent med list.               acetaminophen 325 MG tablet  Commonly known as:  TYLENOL  Take 2 tablets (650 mg total) by mouth every 6 (six) hours as needed for mild pain, moderate pain, fever or headache.     B-12 2500 MCG Tabs  Take 1 tablet by mouth daily.     JUICE PLUS FIBRE Liqd  Take 4 capsules by mouth 3 (three) times daily. 4 vegetable capsules with breakfast, 4 fruit capsules with lunch and 4 berry capsules with dinner.     levothyroxine 137 MCG tablet  Commonly known as:  SYNTHROID, LEVOTHROID  Take 137 mcg by mouth daily.     predniSONE 10 MG tablet  Commonly known as:  DELTASONE  Take 1 tablet (10 mg total) by mouth 2 (two) times daily with a  meal.     Vitamin D3 2000 units Tabs  Take 1 tablet by mouth daily.        Immunizations: Immunization History  Administered Date(s) Administered  . PPD Test 03/20/2015  . Td 02/11/1988, 11/14/2007, 02/22/2014     Physical Exam: Filed Vitals:   03/25/15 0923  BP: 122/58    Pulse: 60  Temp: 97.9 F (36.6 C)  TempSrc: Oral  Resp: 20  Height: 5' 7.5" (1.715 m)  Weight: 203 lb (92.08 kg)  SpO2: 95%   Body mass index is 31.31 kg/(m^2).  General- elderly female, obese but ill appearing, in no acute distress Head- normocephalic, atraumatic Nose- normal nasal mucosa, no maxillary or frontal sinus tenderness, no nasal discharge Throat- moist mucus membrane Eyes- PERRLA, EOMI, no pallor, no icterus, no discharge, normal conjunctiva, normal sclera Neck- no cervical lymphadenopathy Cardiovascular- normal s1,s2, no murmur, has ankle edema   Respiratory- bilateral clear to auscultation, no wheeze, no rhonchi, no crackles, no use of accessory muscles Abdomen- bowel sounds present, soft, non tender Musculoskeletal- able to move all 4 extremities, generalized weakness more prominent in upper extremities with grip strength 4/5 , noted to have tenderness in right paravertebral area but no spasm. Tenderness to both arms noted Neurological- alert and oriented to person, place and time Skin- warm and dry Psychiatry- gets tearful during conversation, normal affect otherwise    Labs reviewed: Basic Metabolic Panel:  Recent Labs  09/09/14 1532 10/01/14 1329 03/17/15 2302 03/19/15 03/19/15 0340  NA 141 142 134* 143 143  K 3.9 4.2 3.8  --  3.6  CL 105  --  99*  --  107  CO2 _0 --  25  GLUCOSE 86 89 120*  --  95  BUN 28* 21._1 CREATININE 0.82 0.8 0.77 0.6 0.63  CALCIUM 9.8 9.1 9.2  --  9.1   Liver Function Tests:  Recent Labs  09/09/14 1532 10/01/14 1329 03/17/15 2302  AST _2 ALT 11 12 11*  ALKPHOS 74 94 65  BILITOT 0.8 0.69 0.8  PROT 6.8 6.2* 6.3*   ALBUMIN 3.9 3.5 2.6*   No results for input(s): LIPASE, AMYLASE in the last 8760 hours. No results for input(s): AMMONIA in the last 8760 hours. CBC:  Recent Labs  10/01/14 1329 11/05/14 1614 03/17/15 2302 03/19/15 0340 03/20/15 03/20/15 0325  WBC 7.8 20.8 Repeated and verified X2.* 14.8* 10.2 6.1 6.1  NEUTROABS 4.7 17.6* 11.4*  --   --   --   HGB 11.7 13.5 9.4* 8.5*  --  9.7*  HCT 35.8 41.1 29.4* 27.4*  --  31.8*  MCV 89.1 87.7 78.2 78.5  --  80.1  PLT 308 278.0 552* 526*  --  591*   Cardiac Enzymes:  Recent Labs  03/18/15 0006 03/18/15 2328  CKTOTAL 18* 14*   BNP: Invalid input(s): POCBNP CBG: No results for input(s): GLUCAP in the last 8760 hours.  Radiological Exams: Ct Abdomen Pelvis Wo Contrast  03/19/2015  CLINICAL DATA:  Fever, weight loss.  History of breast cancer. EXAM: CT CHEST, ABDOMEN AND PELVIS WITHOUT CONTRAST TECHNIQUE: Multidetector CT imaging of the chest, abdomen and pelvis was performed following the standard protocol without IV contrast. COMPARISON:  None. FINDINGS: CT CHEST Mediastinum/Nodes: Heart is borderline in size. Scattered coronary artery and aortic calcifications. No evidence of aortic aneurysm. No mediastinal, hilar, or axillary adenopathy. Lungs/Pleura: Dependent linear densities in the lungs could reflect atelectasis or scarring. No confluent airspace opacities or pleural effusions. Musculoskeletal: Chest wall soft tissues are unremarkable. No acute bony abnormality. CT ABDOMEN AND PELVIS Hepatobiliary: Prior cholecystectomy. No visible focal hepatic abnormality. Pancreas: Unremarkable unenhanced appearance Spleen: Unremarkable unenhanced appearance Adrenals/Urinary Tract: Low-density lesions within the kidneys bilaterally, the largest in the lower pole of the left kidney measuring up to 6 cm. These  were shown to represent cysts on prior CT. The lower pole cysts has enlarged since prior study. No hydronephrosis. No renal or ureteral stones.  Urinary bladder is grossly unremarkable although difficult to visualize due to beam hardening artifact from bilateral hip replacements. Stomach/Bowel: Stomach, large and small bowel unremarkable. Vascular/Lymphatic: Aorta is normal caliber.  No adenopathy Reproductive: No adnexal or pelvic masses. Other: Prior ventral hernia repair.  No free fluid or free air. Musculoskeletal: Prior bilateral hip replacements. No acute bony abnormality. Degenerative changes in the lumbar spine. IMPRESSION: No acute findings or significant abnormality in the chest, abdomen or pelvis. Electronically Signed   By: Rolm Baptise M.D.   On: 03/19/2015 07:11   Dg Chest 2 View  03/18/2015  CLINICAL DATA:  Progressive weakness over several weeks, weight loss. EXAM: CHEST  2 VIEW COMPARISON:  Radiographs 08/07/2010 FINDINGS: Mild cardiomegaly, new from prior exam. Suspect small left pleural effusion with basilar atelectasis. No pulmonary edema. No consolidation to suggest pneumonia. No pneumothorax. Chronic change about the right shoulder. Age related degenerative change in the spine. Surgical clips in the right axilla. IMPRESSION: Mild cardiomegaly, new from 2012. Small left pleural effusion with adjacent atelectasis. Electronically Signed   By: Jeb Levering M.D.   On: 03/18/2015 00:03   Ct Chest Wo Contrast  03/19/2015  CLINICAL DATA:  Fever, weight loss.  History of breast cancer. EXAM: CT CHEST, ABDOMEN AND PELVIS WITHOUT CONTRAST TECHNIQUE: Multidetector CT imaging of the chest, abdomen and pelvis was performed following the standard protocol without IV contrast. COMPARISON:  None. FINDINGS: CT CHEST Mediastinum/Nodes: Heart is borderline in size. Scattered coronary artery and aortic calcifications. No evidence of aortic aneurysm. No mediastinal, hilar, or axillary adenopathy. Lungs/Pleura: Dependent linear densities in the lungs could reflect atelectasis or scarring. No confluent airspace opacities or pleural effusions.  Musculoskeletal: Chest wall soft tissues are unremarkable. No acute bony abnormality. CT ABDOMEN AND PELVIS Hepatobiliary: Prior cholecystectomy. No visible focal hepatic abnormality. Pancreas: Unremarkable unenhanced appearance Spleen: Unremarkable unenhanced appearance Adrenals/Urinary Tract: Low-density lesions within the kidneys bilaterally, the largest in the lower pole of the left kidney measuring up to 6 cm. These were shown to represent cysts on prior CT. The lower pole cysts has enlarged since prior study. No hydronephrosis. No renal or ureteral stones. Urinary bladder is grossly unremarkable although difficult to visualize due to beam hardening artifact from bilateral hip replacements. Stomach/Bowel: Stomach, large and small bowel unremarkable. Vascular/Lymphatic: Aorta is normal caliber.  No adenopathy Reproductive: No adnexal or pelvic masses. Other: Prior ventral hernia repair.  No free fluid or free air. Musculoskeletal: Prior bilateral hip replacements. No acute bony abnormality. Degenerative changes in the lumbar spine. IMPRESSION: No acute findings or significant abnormality in the chest, abdomen or pelvis. Electronically Signed   By: Rolm Baptise M.D.   On: 03/19/2015 07:11   Mr Brain Wo Contrast  03/19/2015  CLINICAL DATA:  82 year old female with weakness since taking steroids in October for arthritis. Initial encounter. Personal history of breast cancer in 2013. EXAM: MRI HEAD WITHOUT CONTRAST MRI CERVICAL SPINE WITHOUT CONTRAST TECHNIQUE: Multiplanar, multiecho pulse sequences of the brain and surrounding structures, and cervical spine, to include the craniocervical junction and cervicothoracic junction, were obtained without intravenous contrast. COMPARISON:  Cervical spine and brain MRI 06/20/2011 FINDINGS: MRI HEAD FINDINGS Major intracranial vascular flow voids are stable. Tortuous distal cervical ICAs re- demonstrated. Cerebral volume is not significantly changed since 2013. No  restricted diffusion to suggest acute infarction. No midline shift, mass effect,  evidence of mass lesion, ventriculomegaly, extra-axial collection or acute intracranial hemorrhage. Cervicomedullary junction and pituitary are within normal limits. Gray and white matter signal is stable and within normal limits for age throughout the brain. No cortical encephalomalacia or chronic cerebral blood products identified. Visible internal auditory structures appear normal. Mastoids are clear. Trace fluid or mucosal thickening in the left sphenoid sinus, other paranasal sinuses are clear. Negative orbit and scalp soft tissues. Bone marrow signal is stable and within normal limits. MRI CERVICAL SPINE FINDINGS Study is intermittently degraded by motion artifact despite repeated imaging attempts. Cervical vertebral height and alignment is stable since 2013, with straightening of lordosis and mild anterolisthesis at C3-C4. Mild degenerative appearing right posterior endplate marrow edema at C4-C5 (series 16, image 3). No other acute osseous abnormality. Chronic ligamentous hypertrophy about the odontoid process has not significantly changed. On STIR imaging there is questionable diffuse increased STIR signal in the posterior paraspinal muscles (series 16). No appreciable muscle atrophy since 2013. C2-C3: Stable mild facet and uncovertebral hypertrophy. Stable mild left C3 foraminal stenosis. C3-C4: Chronic anterolisthesis with moderate to severe left facet hypertrophy. No spinal stenosis. Stable severe left C4 foraminal stenosis. C4-C5: Chronic anterior eccentric disc osteophyte complex. No spinal stenosis. Uncovertebral hypertrophy. Mild bilateral C5 foraminal stenosis is stable to slightly increased. C5-C6: Chronic disc space loss and anterior eccentric disc osteophyte complex. No spinal stenosis. Mild to moderate C6 foraminal stenosis greater on the right appears stable. C6-C7: Stable mostly anterior and lateral disc  osteophyte complex. No spinal or foraminal stenosis. C7-T1: Moderate facet hypertrophy greater on the left has not significantly changed. No spinal stenosis. Moderate left C8 foraminal stenosis is stable. No upper thoracic spinal stenosis. Spinal cord signal is within normal limits at all visualized levels. IMPRESSION: 1. Stable and negative for age noncontrast MRI appearance the brain compared to 2013. 2. Questionable mild diffuse abnormal paraspinal muscle signal such as due to myositis. No appreciable muscle atrophy when compared to 2013. 3. Otherwise no significant change in the MRI appearance of the cervical spine since 2013. No spinal stenosis. No visualized spinal cord abnormality. Electronically Signed   By: H  Hall M.D.   On: 03/19/2015 12:32   Mr Cervical Spine Wo Contrast  03/19/2015  CLINICAL DATA:  81-year-old female with weakness since taking steroids in October for arthritis. Initial encounter. Personal history of breast cancer in 2013. EXAM: MRI HEAD WITHOUT CONTRAST MRI CERVICAL SPINE WITHOUT CONTRAST TECHNIQUE: Multiplanar, multiecho pulse sequences of the brain and surrounding structures, and cervical spine, to include the craniocervical junction and cervicothoracic junction, were obtained without intravenous contrast. COMPARISON:  Cervical spine and brain MRI 06/20/2011 FINDINGS: MRI HEAD FINDINGS Major intracranial vascular flow voids are stable. Tortuous distal cervical ICAs re- demonstrated. Cerebral volume is not significantly changed since 2013. No restricted diffusion to suggest acute infarction. No midline shift, mass effect, evidence of mass lesion, ventriculomegaly, extra-axial collection or acute intracranial hemorrhage. Cervicomedullary junction and pituitary are within normal limits. Gray and white matter signal is stable and within normal limits for age throughout the brain. No cortical encephalomalacia or chronic cerebral blood products identified. Visible internal auditory  structures appear normal. Mastoids are clear. Trace fluid or mucosal thickening in the left sphenoid sinus, other paranasal sinuses are clear. Negative orbit and scalp soft tissues. Bone marrow signal is stable and within normal limits. MRI CERVICAL SPINE FINDINGS Study is intermittently degraded by motion artifact despite repeated imaging attempts. Cervical vertebral height and alignment is stable since 2013, with straightening   of lordosis and mild anterolisthesis at C3-C4. Mild degenerative appearing right posterior endplate marrow edema at C4-C5 (series 16, image 3). No other acute osseous abnormality. Chronic ligamentous hypertrophy about the odontoid process has not significantly changed. On STIR imaging there is questionable diffuse increased STIR signal in the posterior paraspinal muscles (series 16). No appreciable muscle atrophy since 2013. C2-C3: Stable mild facet and uncovertebral hypertrophy. Stable mild left C3 foraminal stenosis. C3-C4: Chronic anterolisthesis with moderate to severe left facet hypertrophy. No spinal stenosis. Stable severe left C4 foraminal stenosis. C4-C5: Chronic anterior eccentric disc osteophyte complex. No spinal stenosis. Uncovertebral hypertrophy. Mild bilateral C5 foraminal stenosis is stable to slightly increased. C5-C6: Chronic disc space loss and anterior eccentric disc osteophyte complex. No spinal stenosis. Mild to moderate C6 foraminal stenosis greater on the right appears stable. C6-C7: Stable mostly anterior and lateral disc osteophyte complex. No spinal or foraminal stenosis. C7-T1: Moderate facet hypertrophy greater on the left has not significantly changed. No spinal stenosis. Moderate left C8 foraminal stenosis is stable. No upper thoracic spinal stenosis. Spinal cord signal is within normal limits at all visualized levels. IMPRESSION: 1. Stable and negative for age noncontrast MRI appearance the brain compared to 2013. 2. Questionable mild diffuse abnormal  paraspinal muscle signal such as due to myositis. No appreciable muscle atrophy when compared to 2013. 3. Otherwise no significant change in the MRI appearance of the cervical spine since 2013. No spinal stenosis. No visualized spinal cord abnormality. Electronically Signed   By: Genevie Ann M.D.   On: 03/19/2015 12:32    Assessment/Plan  Physical deconditioning Will have her work with physical therapy and occupational therapy team to help with gait training and muscle strengthening exercises.fall precautions. Skin care. Encourage to be out of bed.   Urinary tract infection Has completed her antibiotics, asymptomatic at present, maintain hydration  Arm weakness and pain Concern for PMR present, has f/u with rheumatology today. Continue prednisone 10 mg bid for now and change administration time to 9 am and 9 pm. Will have patient work with PT/OT as tolerated to regain strength and restore function.  Fall precautions are in place. Continue tylenol as needed for pain. Will follow rheumatology recs.   Hypothyroidism Lab Results  Component Value Date   TSH 0.481 03/18/2015  Continue levothyroxine 137 mcg daily at 6 am   Protein calorie malnutrition Monitor po intake. Has been drinking protein shakes. Get dietary consult and get weekly weight  Anemia Unclear etiology, check cbc  Vitamin d def Continue vitamin d supplement  Weight loss Unclear etiology. Continue protein supplement. Monitor weight.     Goals of care: short term rehabilitation   Labs/tests ordered: cbc with diff, cmp 03/30/15  Family/ staff Communication: reviewed care plan with patient and nursing supervisor    Blanchie Serve, MD Internal Medicine [place], [place] [address] Cell Phone (Monday-Friday 8 am - 5 pm): [phone] On Call: [phone] and follow prompts after 5 pm and on weekends Office Phone: [phone] Office Fax: [phone]

## 2015-03-31 ENCOUNTER — Encounter: Payer: Self-pay | Admitting: Family

## 2015-03-31 ENCOUNTER — Non-Acute Institutional Stay (SKILLED_NURSING_FACILITY): Payer: PPO | Admitting: Family

## 2015-03-31 DIAGNOSIS — E559 Vitamin D deficiency, unspecified: Secondary | ICD-10-CM

## 2015-03-31 DIAGNOSIS — R29898 Other symptoms and signs involving the musculoskeletal system: Secondary | ICD-10-CM | POA: Diagnosis not present

## 2015-03-31 DIAGNOSIS — E039 Hypothyroidism, unspecified: Secondary | ICD-10-CM | POA: Diagnosis not present

## 2015-03-31 DIAGNOSIS — R6510 Systemic inflammatory response syndrome (SIRS) of non-infectious origin without acute organ dysfunction: Secondary | ICD-10-CM | POA: Diagnosis not present

## 2015-03-31 LAB — HEPATIC FUNCTION PANEL
ALT: 10 U/L (ref 7–35)
AST: 12 U/L — AB (ref 13–35)
Alkaline Phosphatase: 81 U/L (ref 25–125)
Bilirubin, Total: 0.6 mg/dL

## 2015-03-31 LAB — CBC AND DIFFERENTIAL
HCT: 37 % (ref 36–46)
Hemoglobin: 11.4 g/dL — AB (ref 12.0–16.0)
WBC: 13.4 10^3/mL

## 2015-03-31 LAB — BASIC METABOLIC PANEL
BUN: 19 mg/dL (ref 4–21)
CREATININE: 0 mg/dL — AB (ref <=1.1)
GLUCOSE: 141 mg/dL
POTASSIUM: 4.4 mmol/L (ref 3.4–5.3)
SODIUM: 144 mmol/L (ref 137–147)

## 2015-03-31 NOTE — Progress Notes (Signed)
Location:  [facility] Room Number: 408-P Place of Service:  SNF (31)  Provider:Mahima Bubba Camp, MD   PCP: No PCP Per Patient Patient Care Team: No Pcp Per Patient as PCP - General (General Practice) Gaynelle Arabian, MD as Consulting Physician (Orthopedic Surgery) Armandina Gemma, MD as Consulting Physician (General Surgery) Dian Queen, MD (Obstetrics and Gynecology) Autumn Messing III, MD (General Surgery) Chauncey Cruel, MD (Hematology and Oncology)  Extended Emergency Contact Information Primary Emergency Contact: [facility] Address: [address]          [place], [place] [address] [place] Phone: [phone] Relation: Daughter Secondary Emergency Contact: [place] Phone: [phone] Relation: Son  Code Status: Full Code  Goals of care:  Advanced Directive information Advanced Directives 03/31/2015  Does patient have an advance directive? No  Type of Advance Directive -  Copy of advanced directive(s) in chart? -  Would patient like information on creating an advanced directive? No - patient declined information     Allergies  Allergen Reactions  . Shellfish Allergy Anaphylaxis and Hives    Hives all over the body.  . Percocet [Oxycodone-Acetaminophen] Hives  . Iodinated Diagnostic Agents     PT IS NOT AWARE OF IODINE ALLERGY, PREMEDICATED FOR PRIOR PREMEDS ONLY//A.C.  . Iodine Hives  . Prednisone     nausea    Chief Complaint  Patient presents with  . Discharge Note    HPI:  82 y.o. female  Seen today at [facility] for discharge home. She is here short term rehabilitation post hospital admission from 03/17/15-03/20/15 with generalized extremity weakness and UTI. She was treated with antibiotics and urine culture had no significant growth. Blood culture and flu PCR were negative. She was seen by neurology for her extremity weakness. MRI brain and C-spine revealed no clear  etiology. There were concern for PMR vs RA. She received iv solumedrol which improved her clinical symptom some and she is now on po prednisone until seen by rheumatology.She an appointment with rheumatologist 04/01/2015. She has PMH of hypothyroidism, hyperlipidemia, breast cancer among others.She is seen in her room today. She denies any acute issues this visit. She states feeling much stronger and appreciates the exercises she has had with the PT/OT. She has been able to ambulate without any assistive devices and she is very active. PT/OT has given her home exercises. She states has an exercise bike at home, does swimming aerobics and attends a dance classes which she plans to attend after discharge. Facility staff reports no new concerns. No DME required.     Past Medical History  Diagnosis Date  . Chronic venous insufficiency   . PVD (peripheral vascular disease) ([place])     mild carotid 11/05  . Osteoarthritis     bilat hips, severe  . Obesity   . CONTACT DERMATITIS   . GLUCOSE INTOLERANCE   . VITAMIN D DEFICIENCY   . Edema     pt  states had edema of feet and legs has been on lasix per dr. to help  . CAROTID STENOSIS   . DUPUYTREN'S CONTRACTURE   . MENOPAUSAL SYNDROME   . HLD (hyperlipidemia)   . Hypothyroidism     Dr. Ala Dach in [place]   . Breast cancer ([place])     R    Past Surgical History  Procedure Laterality Date  . 2d echo  11/29/03    normal LV size. normal  EF   . Normal caronary arteries      per pt. 2003  . Cholecystectomy    . Inguinal hernia repair    . Tonsillectomy    . Right rotator cuff surgery    . Total hip arthroplasty  05/2008 and 10/2008    B hip - alusio  . Hernia repair      incisional hernia  . Breast biopsy  05/11/11    right breast  . Breast lumpectomy      right breast  . Back surgery  1983    lumbarectomy  . Finger surgery    . Joint replacement Bilateral     Dr. Maureen Ralphs      reports that she has never smoked. She has never used  smokeless tobacco. She reports that she does not drink alcohol or use illicit drugs. Social History   Social History  . Marital Status: Divorced    Spouse Name: N/A  . Number of Children: N/A  . Years of Education: N/A   Occupational History  . Not on file.   Social History Main Topics  . Smoking status: Never Smoker   . Smokeless tobacco: Never Used  . Alcohol Use: No  . Drug Use: No  . Sexual Activity: Not Currently   Other Topics Concern  . Not on file   Social History Narrative   Divorced - helps care for elderly mother.   Prior work-sales. Water aerobics 6/week- 3d water, 3d cardio.    Functional Status Survey:    Allergies  Allergen Reactions  . Shellfish Allergy Anaphylaxis and Hives    Hives all over the body.  . Percocet [Oxycodone-Acetaminophen] Hives  . Iodinated Diagnostic Agents     PT IS NOT AWARE OF IODINE ALLERGY, PREMEDICATED FOR PRIOR PREMEDS ONLY//A.C.  . Iodine Hives  . Prednisone     nausea    Pertinent  Health Maintenance Due  Topic Date Due  . PNA vac Low Risk Adult (1 of 2 - PCV13) 12/10/2000  . INFLUENZA VACCINE  08/12/2015 (Originally 08/12/2014)  . DEXA SCAN  Completed    Medications:   Medication List       This list is accurate as of: 03/31/15  3:14 PM.  Always use your most recent med list.               acetaminophen 325 MG tablet  Commonly known as:  TYLENOL  Take 2 tablets (650 mg total) by mouth every 6 (six) hours as needed for mild pain, moderate pain, fever or headache.     B-12 2500 MCG Tabs  Take 1 tablet by mouth daily.     JUICE PLUS FIBRE Liqd  Take 4 capsules by mouth 3 (three) times daily. 4 vegetable capsules with breakfast, 4 fruit capsules with lunch and 4 berry capsules with dinner.     levothyroxine 137 MCG tablet  Commonly known as:  SYNTHROID, LEVOTHROID  Take 137 mcg by mouth daily.     predniSONE 10 MG tablet  Commonly known as:  DELTASONE  Take 1 tablet (10 mg total) by mouth 2 (two) times  daily with a meal.     Vitamin D3 2000 units Tabs  Take 1 tablet by mouth daily.        Review of Systems  Constitutional: Negative for fever, chills, activity change, appetite change and fatigue.  HENT: Negative.   Eyes: Negative.   Respiratory: Negative.   Cardiovascular: Negative.   Gastrointestinal: Negative.   Genitourinary:  Negative.   Musculoskeletal: Negative.   Skin: Negative.   Allergic/Immunologic: Negative.   Neurological: Negative.   Hematological: Negative.   Psychiatric/Behavioral: Negative.     Filed Vitals:   03/31/15 1502  BP: 114/51  Pulse: 52  Temp: 98.3 F (36.8 C)  TempSrc: Oral  Resp: 18  Height: 5' 7.5" (1.715 m)  Weight: 203 lb (92.08 kg)   Body mass index is 31.31 kg/(m^2). Physical Exam  Constitutional: She is oriented to person, place, and time. She appears well-developed and well-nourished. No distress.  HENT:  Head: Normocephalic.  Mouth/Throat: Oropharynx is clear and moist.  Eyes: Conjunctivae and EOM are normal. Pupils are equal, round, and reactive to light. Right eye exhibits no discharge. Left eye exhibits no discharge. No scleral icterus.  Neck: Normal range of motion. No JVD present. No thyromegaly present.  Cardiovascular: Normal rate, regular rhythm, normal heart sounds and intact distal pulses.  Exam reveals no gallop and no friction rub.   No murmur heard. Pulmonary/Chest: Effort normal and breath sounds normal. No respiratory distress. She has no wheezes. She has no rales. She exhibits no tenderness.  Abdominal: Soft. Bowel sounds are normal. She exhibits no distension and no mass. There is no tenderness. There is no rebound and no guarding.  Musculoskeletal: Normal range of motion. She exhibits no edema or tenderness.  Lymphadenopathy:    She has no cervical adenopathy.  Neurological: She is oriented to person, place, and time.  Skin: Skin is warm and dry. No rash noted. No erythema. No pallor.  Psychiatric: She has a  normal mood and affect.    Labs reviewed: Basic Metabolic Panel:  Recent Labs  09/09/14 1532 10/01/14 1329 03/17/15 2302 03/19/15 03/19/15 0340  NA 141 142 134* 143 143  K 3.9 4.2 3.8  --  3.6  CL 105  --  99*  --  107  CO2 30 28 25   --  25  GLUCOSE 86 89 120*  --  95  BUN 28* 21.6 15 11 11   CREATININE 0.82 0.8 0.77 0.6 0.63  CALCIUM 9.8 9.1 9.2  --  9.1   Liver Function Tests:  Recent Labs  09/09/14 1532 10/01/14 1329 03/17/15 2302  AST 16 17 16   ALT 11 12 11*  ALKPHOS 74 94 65  BILITOT 0.8 0.69 0.8  PROT 6.8 6.2* 6.3*  ALBUMIN 3.9 3.5 2.6*   No results for input(s): LIPASE, AMYLASE in the last 8760 hours. No results for input(s): AMMONIA in the last 8760 hours. CBC:  Recent Labs  10/01/14 1329 11/05/14 1614 03/17/15 2302 03/19/15 0340 03/20/15 03/20/15 0325  WBC 7.8 20.8 Repeated and verified X2.* 14.8* 10.2 6.1 6.1  NEUTROABS 4.7 17.6* 11.4*  --   --   --   HGB 11.7 13.5 9.4* 8.5*  --  9.7*  HCT 35.8 41.1 29.4* 27.4*  --  31.8*  MCV 89.1 87.7 78.2 78.5  --  80.1  PLT 308 278.0 552* 526*  --  591*   Cardiac Enzymes:  Recent Labs  03/18/15 0006 03/18/15 2328  CKTOTAL 18* 14*   BNP: Invalid input(s): POCBNP CBG: No results for input(s): GLUCAP in the last 8760 hours.  Procedures and Imaging Studies During Stay: Ct Abdomen Pelvis Wo Contrast  03/19/2015  CLINICAL DATA:  Fever, weight loss.  History of breast cancer. EXAM: CT CHEST, ABDOMEN AND PELVIS WITHOUT CONTRAST TECHNIQUE: Multidetector CT imaging of the chest, abdomen and pelvis was performed following the standard protocol without IV contrast. COMPARISON:  None. FINDINGS: CT CHEST Mediastinum/Nodes: Heart is borderline in size. Scattered coronary artery and aortic calcifications. No evidence of aortic aneurysm. No mediastinal, hilar, or axillary adenopathy. Lungs/Pleura: Dependent linear densities in the lungs could reflect atelectasis or scarring. No confluent airspace opacities or pleural  effusions. Musculoskeletal: Chest wall soft tissues are unremarkable. No acute bony abnormality. CT ABDOMEN AND PELVIS Hepatobiliary: Prior cholecystectomy. No visible focal hepatic abnormality. Pancreas: Unremarkable unenhanced appearance Spleen: Unremarkable unenhanced appearance Adrenals/Urinary Tract: Low-density lesions within the kidneys bilaterally, the largest in the lower pole of the left kidney measuring up to 6 cm. These were shown to represent cysts on prior CT. The lower pole cysts has enlarged since prior study. No hydronephrosis. No renal or ureteral stones. Urinary bladder is grossly unremarkable although difficult to visualize due to beam hardening artifact from bilateral hip replacements. Stomach/Bowel: Stomach, large and small bowel unremarkable. Vascular/Lymphatic: Aorta is normal caliber.  No adenopathy Reproductive: No adnexal or pelvic masses. Other: Prior ventral hernia repair.  No free fluid or free air. Musculoskeletal: Prior bilateral hip replacements. No acute bony abnormality. Degenerative changes in the lumbar spine. IMPRESSION: No acute findings or significant abnormality in the chest, abdomen or pelvis. Electronically Signed   By: Rolm Baptise M.D.   On: 03/19/2015 07:11   Dg Chest 2 View  03/18/2015  CLINICAL DATA:  Progressive weakness over several weeks, weight loss. EXAM: CHEST  2 VIEW COMPARISON:  Radiographs 08/07/2010 FINDINGS: Mild cardiomegaly, new from prior exam. Suspect small left pleural effusion with basilar atelectasis. No pulmonary edema. No consolidation to suggest pneumonia. No pneumothorax. Chronic change about the right shoulder. Age related degenerative change in the spine. Surgical clips in the right axilla. IMPRESSION: Mild cardiomegaly, new from 2012. Small left pleural effusion with adjacent atelectasis. Electronically Signed   By: Jeb Levering M.D.   On: 03/18/2015 00:03   Ct Chest Wo Contrast  03/19/2015  CLINICAL DATA:  Fever, weight loss.  History  of breast cancer. EXAM: CT CHEST, ABDOMEN AND PELVIS WITHOUT CONTRAST TECHNIQUE: Multidetector CT imaging of the chest, abdomen and pelvis was performed following the standard protocol without IV contrast. COMPARISON:  None. FINDINGS: CT CHEST Mediastinum/Nodes: Heart is borderline in size. Scattered coronary artery and aortic calcifications. No evidence of aortic aneurysm. No mediastinal, hilar, or axillary adenopathy. Lungs/Pleura: Dependent linear densities in the lungs could reflect atelectasis or scarring. No confluent airspace opacities or pleural effusions. Musculoskeletal: Chest wall soft tissues are unremarkable. No acute bony abnormality. CT ABDOMEN AND PELVIS Hepatobiliary: Prior cholecystectomy. No visible focal hepatic abnormality. Pancreas: Unremarkable unenhanced appearance Spleen: Unremarkable unenhanced appearance Adrenals/Urinary Tract: Low-density lesions within the kidneys bilaterally, the largest in the lower pole of the left kidney measuring up to 6 cm. These were shown to represent cysts on prior CT. The lower pole cysts has enlarged since prior study. No hydronephrosis. No renal or ureteral stones. Urinary bladder is grossly unremarkable although difficult to visualize due to beam hardening artifact from bilateral hip replacements. Stomach/Bowel: Stomach, large and small bowel unremarkable. Vascular/Lymphatic: Aorta is normal caliber.  No adenopathy Reproductive: No adnexal or pelvic masses. Other: Prior ventral hernia repair.  No free fluid or free air. Musculoskeletal: Prior bilateral hip replacements. No acute bony abnormality. Degenerative changes in the lumbar spine. IMPRESSION: No acute findings or significant abnormality in the chest, abdomen or pelvis. Electronically Signed   By: Rolm Baptise M.D.   On: 03/19/2015 07:11   Mr Brain Wo Contrast  03/19/2015  CLINICAL DATA:  82 year old female  with weakness since taking steroids in October for arthritis. Initial encounter. Personal  history of breast cancer in 2013. EXAM: MRI HEAD WITHOUT CONTRAST MRI CERVICAL SPINE WITHOUT CONTRAST TECHNIQUE: Multiplanar, multiecho pulse sequences of the brain and surrounding structures, and cervical spine, to include the craniocervical junction and cervicothoracic junction, were obtained without intravenous contrast. COMPARISON:  Cervical spine and brain MRI 06/20/2011 FINDINGS: MRI HEAD FINDINGS Major intracranial vascular flow voids are stable. Tortuous distal cervical ICAs re- demonstrated. Cerebral volume is not significantly changed since 2013. No restricted diffusion to suggest acute infarction. No midline shift, mass effect, evidence of mass lesion, ventriculomegaly, extra-axial collection or acute intracranial hemorrhage. Cervicomedullary junction and pituitary are within normal limits. [place] Cables and white matter signal is stable and within normal limits for age throughout the brain. No cortical encephalomalacia or chronic cerebral blood products identified. Visible internal auditory structures appear normal. Mastoids are clear. Trace fluid or mucosal thickening in the left sphenoid sinus, other paranasal sinuses are clear. Negative orbit and scalp soft tissues. Bone marrow signal is stable and within normal limits. MRI CERVICAL SPINE FINDINGS Study is intermittently degraded by motion artifact despite repeated imaging attempts. Cervical vertebral height and alignment is stable since 2013, with straightening of lordosis and mild anterolisthesis at C3-C4. Mild degenerative appearing right posterior endplate marrow edema at C4-C5 (series 16, image 3). No other acute osseous abnormality. Chronic ligamentous hypertrophy about the odontoid process has not significantly changed. On STIR imaging there is questionable diffuse increased STIR signal in the posterior paraspinal muscles (series 16). No appreciable muscle atrophy since 2013. C2-C3: Stable mild facet and uncovertebral hypertrophy. Stable mild left C3  foraminal stenosis. C3-C4: Chronic anterolisthesis with moderate to severe left facet hypertrophy. No spinal stenosis. Stable severe left C4 foraminal stenosis. C4-C5: Chronic anterior eccentric disc osteophyte complex. No spinal stenosis. Uncovertebral hypertrophy. Mild bilateral C5 foraminal stenosis is stable to slightly increased. C5-C6: Chronic disc space loss and anterior eccentric disc osteophyte complex. No spinal stenosis. Mild to moderate C6 foraminal stenosis greater on the right appears stable. C6-C7: Stable mostly anterior and lateral disc osteophyte complex. No spinal or foraminal stenosis. C7-T1: Moderate facet hypertrophy greater on the left has not significantly changed. No spinal stenosis. Moderate left C8 foraminal stenosis is stable. No upper thoracic spinal stenosis. Spinal cord signal is within normal limits at all visualized levels. IMPRESSION: 1. Stable and negative for age noncontrast MRI appearance the brain compared to 2013. 2. Questionable mild diffuse abnormal paraspinal muscle signal such as due to myositis. No appreciable muscle atrophy when compared to 2013. 3. Otherwise no significant change in the MRI appearance of the cervical spine since 2013. No spinal stenosis. No visualized spinal cord abnormality. Electronically Signed   By: Genevie Ann M.D.   On: 03/19/2015 12:32   Mr Cervical Spine Wo Contrast  03/19/2015  CLINICAL DATA:  82 year old female with weakness since taking steroids in October for arthritis. Initial encounter. Personal history of breast cancer in 2013. EXAM: MRI HEAD WITHOUT CONTRAST MRI CERVICAL SPINE WITHOUT CONTRAST TECHNIQUE: Multiplanar, multiecho pulse sequences of the brain and surrounding structures, and cervical spine, to include the craniocervical junction and cervicothoracic junction, were obtained without intravenous contrast. COMPARISON:  Cervical spine and brain MRI 06/20/2011 FINDINGS: MRI HEAD FINDINGS Major intracranial vascular flow voids are  stable. Tortuous distal cervical ICAs re- demonstrated. Cerebral volume is not significantly changed since 2013. No restricted diffusion to suggest acute infarction. No midline shift, mass effect, evidence of mass lesion, ventriculomegaly, extra-axial collection or  acute intracranial hemorrhage. Cervicomedullary junction and pituitary are within normal limits. [place] Cables and white matter signal is stable and within normal limits for age throughout the brain. No cortical encephalomalacia or chronic cerebral blood products identified. Visible internal auditory structures appear normal. Mastoids are clear. Trace fluid or mucosal thickening in the left sphenoid sinus, other paranasal sinuses are clear. Negative orbit and scalp soft tissues. Bone marrow signal is stable and within normal limits. MRI CERVICAL SPINE FINDINGS Study is intermittently degraded by motion artifact despite repeated imaging attempts. Cervical vertebral height and alignment is stable since 2013, with straightening of lordosis and mild anterolisthesis at C3-C4. Mild degenerative appearing right posterior endplate marrow edema at C4-C5 (series 16, image 3). No other acute osseous abnormality. Chronic ligamentous hypertrophy about the odontoid process has not significantly changed. On STIR imaging there is questionable diffuse increased STIR signal in the posterior paraspinal muscles (series 16). No appreciable muscle atrophy since 2013. C2-C3: Stable mild facet and uncovertebral hypertrophy. Stable mild left C3 foraminal stenosis. C3-C4: Chronic anterolisthesis with moderate to severe left facet hypertrophy. No spinal stenosis. Stable severe left C4 foraminal stenosis. C4-C5: Chronic anterior eccentric disc osteophyte complex. No spinal stenosis. Uncovertebral hypertrophy. Mild bilateral C5 foraminal stenosis is stable to slightly increased. C5-C6: Chronic disc space loss and anterior eccentric disc osteophyte complex. No spinal stenosis. Mild to moderate  C6 foraminal stenosis greater on the right appears stable. C6-C7: Stable mostly anterior and lateral disc osteophyte complex. No spinal or foraminal stenosis. C7-T1: Moderate facet hypertrophy greater on the left has not significantly changed. No spinal stenosis. Moderate left C8 foraminal stenosis is stable. No upper thoracic spinal stenosis. Spinal cord signal is within normal limits at all visualized levels. IMPRESSION: 1. Stable and negative for age noncontrast MRI appearance the brain compared to 2013. 2. Questionable mild diffuse abnormal paraspinal muscle signal such as due to myositis. No appreciable muscle atrophy when compared to 2013. 3. Otherwise no significant change in the MRI appearance of the cervical spine since 2013. No spinal stenosis. No visualized spinal cord abnormality. Electronically Signed   By: Genevie Ann M.D.   On: 03/19/2015 12:32    Assessment/Plan:   Hypothyroidism Continue on Levothyroxine 137 mcg Tablet. Follow up with PCP to monitor TSH level.  Weakness Has improved with PT/OT. PT/OT has given her home exercises. She states has an exercise bike at home, does swimming aerobics and attends a dance classes which she plans to attend after discharge. Facility staff reports no new concerns.  SIRS (Systemic inflammatory response syndrome) S/p short term rehabilitation post hospital admission from 03/17/15-03/20/15 with generalized extremity weakness and UTI. She was treated with antibiotics and urine culture had no significant growth. Blood culture and flu PCR were negative. She was seen by neurology for her extremity weakness. MRI brain and C-spine revealed no clear etiology. There were concern for PMR vs RA. She received iv solumedrol which improved her clinical symptom some and she is now on po prednisone until seen by rheumatology.She an appointment with rheumatologist 04/01/2015. Vitamin D deficiency  Continue with Vit D supplements. Monitor vit D level  Patient is being  discharged with the following home health services:   None  Patient is being discharged with the following durable medical equipment:   No DME required.  Rx written X 1 Month supply.  Patient has been advised to f/u with their PCP in 1-2 weeks to bring them up to date on their rehab stay.  Social services at facility was responsible  for arranging this appointment.  Pt was provided with a 30 day supply of prescriptions for medications and refills must be obtained from their PCP.  For controlled substances, a more limited supply may be provided adequate until PCP appointment only.  Future labs/tests needed: CBC, BMP, TSH

## 2015-04-02 ENCOUNTER — Encounter: Payer: Self-pay | Admitting: *Deleted

## 2015-04-02 DIAGNOSIS — M6281 Muscle weakness (generalized): Secondary | ICD-10-CM | POA: Diagnosis not present

## 2015-04-02 DIAGNOSIS — R5383 Other fatigue: Secondary | ICD-10-CM | POA: Diagnosis not present

## 2015-04-02 DIAGNOSIS — M059 Rheumatoid arthritis with rheumatoid factor, unspecified: Secondary | ICD-10-CM | POA: Diagnosis not present

## 2015-04-02 DIAGNOSIS — R700 Elevated erythrocyte sedimentation rate: Secondary | ICD-10-CM | POA: Diagnosis not present

## 2015-04-08 DIAGNOSIS — R5382 Chronic fatigue, unspecified: Secondary | ICD-10-CM | POA: Diagnosis not present

## 2015-04-08 DIAGNOSIS — E785 Hyperlipidemia, unspecified: Secondary | ICD-10-CM | POA: Diagnosis not present

## 2015-04-08 DIAGNOSIS — E538 Deficiency of other specified B group vitamins: Secondary | ICD-10-CM | POA: Diagnosis not present

## 2015-04-08 DIAGNOSIS — R946 Abnormal results of thyroid function studies: Secondary | ICD-10-CM | POA: Diagnosis not present

## 2015-04-08 DIAGNOSIS — E559 Vitamin D deficiency, unspecified: Secondary | ICD-10-CM | POA: Diagnosis not present

## 2015-04-08 DIAGNOSIS — E018 Other iodine-deficiency related thyroid disorders and allied conditions: Secondary | ICD-10-CM | POA: Diagnosis not present

## 2015-04-08 DIAGNOSIS — M818 Other osteoporosis without current pathological fracture: Secondary | ICD-10-CM | POA: Diagnosis not present

## 2015-04-08 DIAGNOSIS — R5383 Other fatigue: Secondary | ICD-10-CM | POA: Diagnosis not present

## 2015-04-14 ENCOUNTER — Other Ambulatory Visit: Payer: Self-pay | Admitting: *Deleted

## 2015-04-14 DIAGNOSIS — C50211 Malignant neoplasm of upper-inner quadrant of right female breast: Secondary | ICD-10-CM

## 2015-04-15 ENCOUNTER — Ambulatory Visit (HOSPITAL_BASED_OUTPATIENT_CLINIC_OR_DEPARTMENT_OTHER): Payer: PPO | Admitting: Oncology

## 2015-04-15 ENCOUNTER — Other Ambulatory Visit (HOSPITAL_BASED_OUTPATIENT_CLINIC_OR_DEPARTMENT_OTHER): Payer: PPO

## 2015-04-15 ENCOUNTER — Telehealth: Payer: Self-pay | Admitting: Oncology

## 2015-04-15 VITALS — BP 134/57 | HR 56 | Temp 98.2°F | Resp 18 | Ht 67.5 in | Wt 184.9 lb

## 2015-04-15 DIAGNOSIS — C50911 Malignant neoplasm of unspecified site of right female breast: Secondary | ICD-10-CM

## 2015-04-15 DIAGNOSIS — Z853 Personal history of malignant neoplasm of breast: Secondary | ICD-10-CM

## 2015-04-15 DIAGNOSIS — C50211 Malignant neoplasm of upper-inner quadrant of right female breast: Secondary | ICD-10-CM

## 2015-04-15 LAB — COMPREHENSIVE METABOLIC PANEL
ALT: 10 U/L (ref 0–55)
ANION GAP: 10 meq/L (ref 3–11)
AST: 10 U/L (ref 5–34)
Albumin: 3.1 g/dL — ABNORMAL LOW (ref 3.5–5.0)
Alkaline Phosphatase: 72 U/L (ref 40–150)
BUN: 21.1 mg/dL (ref 7.0–26.0)
CHLORIDE: 107 meq/L (ref 98–109)
CO2: 26 meq/L (ref 22–29)
CREATININE: 0.8 mg/dL (ref 0.6–1.1)
Calcium: 10.1 mg/dL (ref 8.4–10.4)
EGFR: 69 mL/min/{1.73_m2} — ABNORMAL LOW (ref >90)
Glucose: 98 mg/dl (ref 70–140)
POTASSIUM: 3.9 meq/L (ref 3.5–5.1)
Sodium: 143 mEq/L (ref 136–145)
Total Bilirubin: 0.56 mg/dL (ref 0.20–1.20)
Total Protein: 6.9 g/dL (ref 6.4–8.3)

## 2015-04-15 LAB — CBC WITH DIFFERENTIAL/PLATELET
BASO%: 0.2 % (ref 0.0–2.0)
BASOS ABS: 0 10*3/uL (ref 0.0–0.1)
EOS ABS: 0.1 10*3/uL (ref 0.0–0.5)
EOS%: 0.6 % (ref 0.0–7.0)
HCT: 35.9 % (ref 34.8–46.6)
HGB: 11.6 g/dL (ref 11.6–15.9)
LYMPH%: 19.4 % (ref 14.0–49.7)
MCH: 25.6 pg (ref 25.1–34.0)
MCHC: 32.3 g/dL (ref 31.5–36.0)
MCV: 79.4 fL — ABNORMAL LOW (ref 79.5–101.0)
MONO#: 0.8 10*3/uL (ref 0.1–0.9)
MONO%: 8.2 % (ref 0.0–14.0)
NEUT#: 6.7 10*3/uL — ABNORMAL HIGH (ref 1.5–6.5)
NEUT%: 71.6 % (ref 38.4–76.8)
Platelets: 413 10*3/uL — ABNORMAL HIGH (ref 145–400)
RBC: 4.53 10*6/uL (ref 3.70–5.45)
RDW: 20.8 % — ABNORMAL HIGH (ref 11.2–14.5)
WBC: 9.4 10*3/uL (ref 3.9–10.3)
lymph#: 1.8 10*3/uL (ref 0.9–3.3)

## 2015-04-15 MED ORDER — PREDNISONE 10 MG PO TABS: 20.0000 mg | ORAL_TABLET | Freq: Two times a day (BID) | ORAL | Status: DC

## 2015-04-15 NOTE — Progress Notes (Signed)
ID: Rachael [name]   DOB: 10-24-1933  MR#: [id number]  POL#:[id number]  PCP: [name], MD GYN: Dian Queen, MD SU: Autumn Messing, MD OTHER MD:  Minus Breeding, MD;  Nena Polio, MD; Thea Silversmith, MD;  Lucio Edward, MD, Sabino Niemann MD  CHIEF COMPLAINT:  Right Breast Cancer  CURRENT TREATMENT: Observation  BREAST CANCER HISTORY: From the original intake note:  Rachael [name] had routine screening mammography at [facility] 05/06/2011, showing an area of focal asymmetry in the lower inner quadrant of the right breast. Additional views 05/10/2011 confirmed a 9 mm area of irregularity without associated microcalcifications. Ultrasound of the right breast confirmed a 9 mm hypoechoic mass with posterior acoustic shadowing. Biopsy of this mass was performed the next day, and showed ([id number]) and invasive lobular carcinoma, E-cadherin negative, which was estrogen receptor positive at 97%, and progesterone receptor positive at 81%. There was no HER-2 amplification, and MIB-1 was 12%.  On may 07/31/2011 him of the patient underwent breast MRI, which showed a 1.5 cm mildly irregular enhancing mass in the upper inner quadrant of the right breast. There were no other masses of concern and no abnormal appearing lymph nodes. Accordingly, on 06/16/2011 the patient underwent right lumpectomy and sentinel lymph node sampling under Dr. Star Age, the final pathology (SZ a [id number]) showing an invasive lobular carcinoma, grade 1, measuring 1.5 cm, with both sentinel lymph nodes negative.   The patient's subsequent history is as detailed below.  INTERVAL HISTORY: Rachael [name] returns today for follow up of her breast cancer. She is currently only under observation. She continues to have significant rheumatologic problems which are being handled through Dr.Syed. She has been tried on methotrexate which she says but are flat on her back, and then on various doses of prednisone. He is currently on 20 mg. She tells me her daughter  is very concerned because "that's a high-dose". It does allow her to function. She says there is further workup in progress through neurology.    REVIEW OF SYSTEMS: While Rachael [name] has significant problems related to her presumptive diagnosis of rheumatoid arthritis, she does not give me any symptoms suggestive of recurrent or progressive disease. She recently had scans particularly of the brain and neck but also CTs of the chest abdomen and pelvis which showed no evidence of metastatic disease. A detailed review of systems today was otherwise stable.   PAST MEDICAL HISTORY: Past Medical History  Diagnosis Date  . Chronic venous insufficiency   . PVD (peripheral vascular disease) ([place])     mild carotid 11/05  . Osteoarthritis     bilat hips, severe  . Obesity   . CONTACT DERMATITIS   . GLUCOSE INTOLERANCE   . VITAMIN D DEFICIENCY   . Edema     pt  states had edema of feet and legs has been on lasix per dr. to help  . CAROTID STENOSIS   . DUPUYTREN'S CONTRACTURE   . MENOPAUSAL SYNDROME   . HLD (hyperlipidemia)   . Hypothyroidism     Dr. Ala Dach in [place]   . Breast cancer ([place])     R    PAST SURGICAL HISTORY: Past Surgical History  Procedure Laterality Date  . 2d echo  11/29/03    normal LV size. normal EF   . Normal caronary arteries      per pt. 2003  . Cholecystectomy    . Inguinal hernia repair    . Tonsillectomy    . Right rotator cuff surgery    .  Total hip arthroplasty  05/2008 and 10/2008    B hip - alusio  . Hernia repair      incisional hernia  . Breast biopsy  05/11/11    right breast  . Breast lumpectomy      right breast  . Back surgery  1983    lumbarectomy  . Finger surgery    . Joint replacement Bilateral     Dr. Maureen Ralphs    FAMILY HISTORY Family History  Problem Relation Age of Onset  . Coronary artery disease      family hx - female 1st degree relative <60  . Hypothyroidism      aunt   . Uterine cancer Mother 65  . Cancer Mother   .  Breast cancer Daughter 63    BRCA negative (no BART)  . Cancer Daughter   . Pancreatic cancer Maternal Grandfather     diagnosed in his 73s  . Anesthesia problems Neg Hx   . Prostate cancer Maternal Uncle     diagnosed in his 66s  . Colon cancer Cousin   . Prostate cancer Paternal Uncle     diagnosed in his 51s  . Clotting disorder Maternal Grandmother   . Uterine cancer Cousin   . Leukemia Cousin   . Prostate cancer Cousin   . Heart disease Father     before age 45  . Varicose Veins Father   . Heart disease Brother     before age 48  . Cancer Sister    the patient's father died at the age of 30 from heart disease. The patient's mother died in 20 at the age of 5. She had uterine cancer in her 82s. There is a significant cancer family on her side, to be detailed when the patient meets with her genetics counselor. The patient herself has no sisters, does have 3 brothers. One of the patient's daughters, Rachael [name], was diagnosed with breast cancer at the age of 40. She was tested for the BRCA gene and was negative. There is no other breast or ovarian cancer in the immediate family to her knowledge.  GYNECOLOGIC HISTORY: Menarche age 60, menopause age 24. The patient tried Prempro briefly but did not like it. She is GX P5, first live birth age 87.  SOCIAL HISTORY:   (Updated December 2014) Rachael [name] worked in [employer], currently is developing a [employer]. She is divorced and lives by herself. 3 of her 5 children live in town ([place], [place], and [place]; [name] is also followed here w a history of breast cancer) and 2 live in [place] and [place]). She has 7 grandchildren. She attends the [employer].   ADVANCED DIRECTIVES: Not in place  HEALTH MAINTENANCE:  (Updated December 2014) Social History  Substance Use Topics  . Smoking status: Never Smoker   . Smokeless tobacco: Never Used  . Alcohol Use: No     Colonoscopy: 2009/[facility]  PAP: Sept  2014/[facility]  B[facility]: [facility], Normal  Lipid panel: Sept 2014/ [facility]    Allergies  Allergen Reactions  . Shellfish Allergy Anaphylaxis and Hives    Hives all over the body.  . Percocet [Oxycodone-Acetaminophen] Hives  . Iodinated Diagnostic Agents     PT IS NOT AWARE OF IODINE ALLERGY, PREMEDICATED FOR PRIOR PREMEDS ONLY//A.C.  . Iodine Hives  . Prednisone     nausea    Current Outpatient Prescriptions  Medication Sig Dispense Refill  . acetaminophen (TYLENOL) 325 MG tablet  Take 2 tablets (650 mg total) by mouth every 6 (six) hours as needed for mild pain, moderate pain, fever or headache.    . Cholecalciferol (VITAMIN D3) 2000 units TABS Take 1 tablet by mouth daily.    . Cyanocobalamin (B-12) 2500 MCG TABS Take 1 tablet by mouth daily.    Marland Kitchen levothyroxine (SYNTHROID, LEVOTHROID) 137 MCG tablet Take 137 mcg by mouth daily.     . Nutritional Supplements (JUICE PLUS FIBRE) LIQD Take 4 capsules by mouth 3 (three) times daily. 4 vegetable capsules with breakfast, 4 fruit capsules with lunch and 4 berry capsules with dinner.    . predniSONE (DELTASONE) 10 MG tablet Take 1 tablet (10 mg total) by mouth 2 (two) times daily with a meal.     No current facility-administered medications for this visit.    OBJECTIVE: Middle-aged white woman In no acute distress  Filed Vitals:   04/15/15 0940  BP: 134/57  Pulse: 56  Temp: 98.2 F (36.8 C)  Resp: 18     Body mass index is 28.52 kg/(m^2).    ECOG FS: 1 Filed Weights   04/15/15 0940  Weight: 184 lb 14.4 oz (83.87 kg)   Sclerae unicteric, pupils round and equal Oropharynx clear and moist-- no thrush or other lesions No cervical or supraclavicular adenopathy Lungs no rales or rhonchi Heart regular rate and rhythm Abd soft, nontender, positive bowel sounds MSK no focal spinal tenderness, no upper extremity lymphedema Neuro: nonfocal, well oriented, appropriate affect Breasts: The right breast is status post  lumpectomy. There is no evidence of local recurrence. The right axilla is benign. Left breast is unremarkable    LAB RESULTS: Lab Results  Component Value Date   WBC 9.4 04/15/2015   NEUTROABS 6.7* 04/15/2015   HGB 11.6 04/15/2015   HCT 35.9 04/15/2015   MCV 79.4* 04/15/2015   PLT 413* 04/15/2015      Chemistry      Component Value Date/Time   NA 143 04/15/2015 0919   NA 144 03/31/2015   NA 143 03/19/2015 0340   K 3.9 04/15/2015 0919   K 4.4 03/31/2015   CL 107 03/19/2015 0340   CL 104 06/20/2012 1421   CO2 26 04/15/2015 0919   CO2 25 03/19/2015 0340   BUN 21.1 04/15/2015 0919   BUN 19 03/31/2015   BUN 11 03/19/2015 0340   CREATININE 0.8 04/15/2015 0919   CREATININE 0.0* 03/31/2015   CREATININE 0.63 03/19/2015 0340   GLU 141 03/31/2015      Component Value Date/Time   CALCIUM 10.1 04/15/2015 0919   CALCIUM 9.1 03/19/2015 0340   ALKPHOS 72 04/15/2015 0919   ALKPHOS 81 03/31/2015   AST 10 04/15/2015 0919   AST 12* 03/31/2015   ALT 10 04/15/2015 0919   ALT 10 03/31/2015   BILITOT 0.56 04/15/2015 0919   BILITOT 0.8 03/17/2015 2302       STUDIES: Ct Abdomen Pelvis Wo Contrast  03/19/2015  CLINICAL DATA:  Fever, weight loss.  History of breast cancer. EXAM: CT CHEST, ABDOMEN AND PELVIS WITHOUT CONTRAST TECHNIQUE: Multidetector CT imaging of the chest, abdomen and pelvis was performed following the standard protocol without IV contrast. COMPARISON:  None. FINDINGS: CT CHEST Mediastinum/Nodes: Heart is borderline in size. Scattered coronary artery and aortic calcifications. No evidence of aortic aneurysm. No mediastinal, hilar, or axillary adenopathy. Lungs/Pleura: Dependent linear densities in the lungs could reflect atelectasis or scarring. No confluent airspace opacities or pleural effusions. Musculoskeletal: Chest wall soft tissues are unremarkable.  No acute bony abnormality. CT ABDOMEN AND PELVIS Hepatobiliary: Prior cholecystectomy. No visible focal hepatic  abnormality. Pancreas: Unremarkable unenhanced appearance Spleen: Unremarkable unenhanced appearance Adrenals/Urinary Tract: Low-density lesions within the kidneys bilaterally, the largest in the lower pole of the left kidney measuring up to 6 cm. These were shown to represent cysts on prior CT. The lower pole cysts has enlarged since prior study. No hydronephrosis. No renal or ureteral stones. Urinary bladder is grossly unremarkable although difficult to visualize due to beam hardening artifact from bilateral hip replacements. Stomach/Bowel: Stomach, large and small bowel unremarkable. Vascular/Lymphatic: Aorta is normal caliber.  No adenopathy Reproductive: No adnexal or pelvic masses. Other: Prior ventral hernia repair.  No free fluid or free air. Musculoskeletal: Prior bilateral hip replacements. No acute bony abnormality. Degenerative changes in the lumbar spine. IMPRESSION: No acute findings or significant abnormality in the chest, abdomen or pelvis. Electronically Signed   By: Rolm Baptise M.D.   On: 03/19/2015 07:11   Dg Chest 2 View  03/18/2015  CLINICAL DATA:  Progressive weakness over several weeks, weight loss. EXAM: CHEST  2 VIEW COMPARISON:  Radiographs 08/07/2010 FINDINGS: Mild cardiomegaly, new from prior exam. Suspect small left pleural effusion with basilar atelectasis. No pulmonary edema. No consolidation to suggest pneumonia. No pneumothorax. Chronic change about the right shoulder. Age related degenerative change in the spine. Surgical clips in the right axilla. IMPRESSION: Mild cardiomegaly, new from 2012. Small left pleural effusion with adjacent atelectasis. Electronically Signed   By: Jeb Levering M.D.   On: 03/18/2015 00:03   Ct Chest Wo Contrast  03/19/2015  CLINICAL DATA:  Fever, weight loss.  History of breast cancer. EXAM: CT CHEST, ABDOMEN AND PELVIS WITHOUT CONTRAST TECHNIQUE: Multidetector CT imaging of the chest, abdomen and pelvis was performed following the standard  protocol without IV contrast. COMPARISON:  None. FINDINGS: CT CHEST Mediastinum/Nodes: Heart is borderline in size. Scattered coronary artery and aortic calcifications. No evidence of aortic aneurysm. No mediastinal, hilar, or axillary adenopathy. Lungs/Pleura: Dependent linear densities in the lungs could reflect atelectasis or scarring. No confluent airspace opacities or pleural effusions. Musculoskeletal: Chest wall soft tissues are unremarkable. No acute bony abnormality. CT ABDOMEN AND PELVIS Hepatobiliary: Prior cholecystectomy. No visible focal hepatic abnormality. Pancreas: Unremarkable unenhanced appearance Spleen: Unremarkable unenhanced appearance Adrenals/Urinary Tract: Low-density lesions within the kidneys bilaterally, the largest in the lower pole of the left kidney measuring up to 6 cm. These were shown to represent cysts on prior CT. The lower pole cysts has enlarged since prior study. No hydronephrosis. No renal or ureteral stones. Urinary bladder is grossly unremarkable although difficult to visualize due to beam hardening artifact from bilateral hip replacements. Stomach/Bowel: Stomach, large and small bowel unremarkable. Vascular/Lymphatic: Aorta is normal caliber.  No adenopathy Reproductive: No adnexal or pelvic masses. Other: Prior ventral hernia repair.  No free fluid or free air. Musculoskeletal: Prior bilateral hip replacements. No acute bony abnormality. Degenerative changes in the lumbar spine. IMPRESSION: No acute findings or significant abnormality in the chest, abdomen or pelvis. Electronically Signed   By: Rolm Baptise M.D.   On: 03/19/2015 07:11   Mr Brain Wo Contrast  03/19/2015  CLINICAL DATA:  82 year old female with weakness since taking steroids in October for arthritis. Initial encounter. Personal history of breast cancer in 2013. EXAM: MRI HEAD WITHOUT CONTRAST MRI CERVICAL SPINE WITHOUT CONTRAST TECHNIQUE: Multiplanar, multiecho pulse sequences of the brain and surrounding  structures, and cervical spine, to include the craniocervical junction and cervicothoracic junction, were obtained  without intravenous contrast. COMPARISON:  Cervical spine and brain MRI 06/20/2011 FINDINGS: MRI HEAD FINDINGS Major intracranial vascular flow voids are stable. Tortuous distal cervical ICAs re- demonstrated. Cerebral volume is not significantly changed since 2013. No restricted diffusion to suggest acute infarction. No midline shift, mass effect, evidence of mass lesion, ventriculomegaly, extra-axial collection or acute intracranial hemorrhage. Cervicomedullary junction and pituitary are within normal limits. Pear[place] Cables and white matter signal is stable and within normal limits for age throughout the brain. No cortical encephalomalacia or chronic cerebral blood products identified. Visible internal auditory structures appear normal. Mastoids are clear. Trace fluid or mucosal thickening in the left sphenoid sinus, other paranasal sinuses are clear. Negative orbit and scalp soft tissues. Bone marrow signal is stable and within normal limits. MRI CERVICAL SPINE FINDINGS Study is intermittently degraded by motion artifact despite repeated imaging attempts. Cervical vertebral height and alignment is stable since 2013, with straightening of lordosis and mild anterolisthesis at C3-C4. Mild degenerative appearing right posterior endplate marrow edema at C4-C5 (series 16, image 3). No other acute osseous abnormality. Chronic ligamentous hypertrophy about the odontoid process has not significantly changed. On STIR imaging there is questionable diffuse increased STIR signal in the posterior paraspinal muscles (series 16). No appreciable muscle atrophy since 2013. C2-C3: Stable mild facet and uncovertebral hypertrophy. Stable mild left C3 foraminal stenosis. C3-C4: Chronic anterolisthesis with moderate to severe left facet hypertrophy. No spinal stenosis. Stable severe left C4 foraminal stenosis. C4-C5: Chronic  anterior eccentric disc osteophyte complex. No spinal stenosis. Uncovertebral hypertrophy. Mild bilateral C5 foraminal stenosis is stable to slightly increased. C5-C6: Chronic disc space loss and anterior eccentric disc osteophyte complex. No spinal stenosis. Mild to moderate C6 foraminal stenosis greater on the right appears stable. C6-C7: Stable mostly anterior and lateral disc osteophyte complex. No spinal or foraminal stenosis. C7-T1: Moderate facet hypertrophy greater on the left has not significantly changed. No spinal stenosis. Moderate left C8 foraminal stenosis is stable. No upper thoracic spinal stenosis. Spinal cord signal is within normal limits at all visualized levels. IMPRESSION: 1. Stable and negative for age noncontrast MRI appearance the brain compared to 2013. 2. Questionable mild diffuse abnormal paraspinal muscle signal such as due to myositis. No appreciable muscle atrophy when compared to 2013. 3. Otherwise no significant change in the MRI appearance of the cervical spine since 2013. No spinal stenosis. No visualized spinal cord abnormality. Electronically Signed   By: Genevie Ann M.D.   On: 03/19/2015 12:32   Mr Cervical Spine Wo Contrast  03/19/2015  CLINICAL DATA:  82 year old female with weakness since taking steroids in October for arthritis. Initial encounter. Personal history of breast cancer in 2013. EXAM: MRI HEAD WITHOUT CONTRAST MRI CERVICAL SPINE WITHOUT CONTRAST TECHNIQUE: Multiplanar, multiecho pulse sequences of the brain and surrounding structures, and cervical spine, to include the craniocervical junction and cervicothoracic junction, were obtained without intravenous contrast. COMPARISON:  Cervical spine and brain MRI 06/20/2011 FINDINGS: MRI HEAD FINDINGS Major intracranial vascular flow voids are stable. Tortuous distal cervical ICAs re- demonstrated. Cerebral volume is not significantly changed since 2013. No restricted diffusion to suggest acute infarction. No midline  shift, mass effect, evidence of mass lesion, ventriculomegaly, extra-axial collection or acute intracranial hemorrhage. Cervicomedullary junction and pituitary are within normal limits. Pear[place] Cables and white matter signal is stable and within normal limits for age throughout the brain. No cortical encephalomalacia or chronic cerebral blood products identified. Visible internal auditory structures appear normal. Mastoids are clear. Trace fluid or mucosal thickening in the left  sphenoid sinus, other paranasal sinuses are clear. Negative orbit and scalp soft tissues. Bone marrow signal is stable and within normal limits. MRI CERVICAL SPINE FINDINGS Study is intermittently degraded by motion artifact despite repeated imaging attempts. Cervical vertebral height and alignment is stable since 2013, with straightening of lordosis and mild anterolisthesis at C3-C4. Mild degenerative appearing right posterior endplate marrow edema at C4-C5 (series 16, image 3). No other acute osseous abnormality. Chronic ligamentous hypertrophy about the odontoid process has not significantly changed. On STIR imaging there is questionable diffuse increased STIR signal in the posterior paraspinal muscles (series 16). No appreciable muscle atrophy since 2013. C2-C3: Stable mild facet and uncovertebral hypertrophy. Stable mild left C3 foraminal stenosis. C3-C4: Chronic anterolisthesis with moderate to severe left facet hypertrophy. No spinal stenosis. Stable severe left C4 foraminal stenosis. C4-C5: Chronic anterior eccentric disc osteophyte complex. No spinal stenosis. Uncovertebral hypertrophy. Mild bilateral C5 foraminal stenosis is stable to slightly increased. C5-C6: Chronic disc space loss and anterior eccentric disc osteophyte complex. No spinal stenosis. Mild to moderate C6 foraminal stenosis greater on the right appears stable. C6-C7: Stable mostly anterior and lateral disc osteophyte complex. No spinal or foraminal stenosis. C7-T1: Moderate  facet hypertrophy greater on the left has not significantly changed. No spinal stenosis. Moderate left C8 foraminal stenosis is stable. No upper thoracic spinal stenosis. Spinal cord signal is within normal limits at all visualized levels. IMPRESSION: 1. Stable and negative for age noncontrast MRI appearance the brain compared to 2013. 2. Questionable mild diffuse abnormal paraspinal muscle signal such as due to myositis. No appreciable muscle atrophy when compared to 2013. 3. Otherwise no significant change in the MRI appearance of the cervical spine since 2013. No spinal stenosis. No visualized spinal cord abnormality. Electronically Signed   By: Genevie Ann M.D.   On: 03/19/2015 12:32    ASSESSMENT: 82 y.o. [place] woman   (1)  s/p Right lumpectomy and sentinel lymph node sampling 06/16/2011 for a T1c N0, stage IA invasive lobular breast cancer, grade 1, strongly estrogen and progesterone positive, with no HER-2 amplification, and an Mib-1 of 12%  (2) the patient met with radiation oncology 07/14/2011, but opted against adjuvant radiation  (3) started anastrozole July 2013, briefly discontinued because of side effects but resumed October 2013. Stopped October 2016.  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 other genes tested were normal and included APC, ATM, BARD1, BMPR1A, BRCA1, BRCA2, BRIP1, CDH1, CHEK2, EPCAM, MLH1, MRE11A, MSH6, MUTYH, NBN, PALB2, PMS2, PTEN, RAD50, RAD51C, SMAD4, STK11 and TP53  PLAN:  Rachael [name]  continues to have significant problems related to her rheumatologic condition and its treatment. This is not stable and she is not in a stable situation at present. Accordingly I don't think were going to be able to start anti-estrogens yet.  We do have data from patients who had been previously on tamoxifen, went off that medication for 2 or 3 years, and then started aromatase inhibitors, that is running aromatase inhibitors even late  does significantly decrease the risk of breast cancer recurrence.  Accordingly I will see her again in October, after her September mammography. I am hopeful things will be more under control by then and we will be able to start her on an aromatase inhibitor then.  She has a good understanding of this plan. She knows to call for any problems that may develop before her next visit here.  Chauncey Cruel, MD  04/15/2015

## 2015-04-15 NOTE — Telephone Encounter (Signed)
appt made and avs printed °

## 2015-04-17 ENCOUNTER — Ambulatory Visit (INDEPENDENT_AMBULATORY_CARE_PROVIDER_SITE_OTHER): Payer: PPO | Admitting: Neurology

## 2015-04-17 ENCOUNTER — Ambulatory Visit (INDEPENDENT_AMBULATORY_CARE_PROVIDER_SITE_OTHER): Payer: Self-pay | Admitting: Neurology

## 2015-04-17 ENCOUNTER — Encounter: Payer: Self-pay | Admitting: Neurology

## 2015-04-17 DIAGNOSIS — M6281 Muscle weakness (generalized): Secondary | ICD-10-CM

## 2015-04-17 DIAGNOSIS — R29898 Other symptoms and signs involving the musculoskeletal system: Secondary | ICD-10-CM

## 2015-04-17 NOTE — Procedures (Signed)
     HISTORY:  Rachael [name] is a 82 year old patient with a history of generalized weakness of the arms and legs that has been present since around August 2016. The patient has been on prednisone, each attempt to taper the prednisone has resulted in a significant exacerbation of her weakness. She is being evaluated for a possible myopathy. Sedimentation rates have been elevated, CK enzyme levels are normal or low.  NERVE CONDUCTION STUDIES:  Nerve conduction studies done on both upper extremities revealed evidence of a elongation of the distal motor latency of the right median nerve, normal on the left. The motor amplitudes for these nerves are normal bilaterally. The distal motor latencies and motor amplitudes for the ulnar nerves were normal bilaterally. The F wave latencies and nerve conduction velocities for the median and ulnar nerves were normal bilaterally. The sensory latencies for the median nerves were prolonged on the right, normal on the left, and normal for the ulnar nerves bilaterally.  EMG STUDIES:  EMG study was performed on the left upper extremity:  The first dorsal interosseous muscle reveals 2 to 4 K units with full recruitment. No fibrillations or positive waves were noted. The abductor pollicis brevis muscle reveals 2 to 4 K units with full recruitment. No fibrillations or positive waves were noted. The extensor indicis proprius muscle reveals 1 to 3 K units with full recruitment. No fibrillations or positive waves were noted. The pronator teres muscle reveals 2 to 3 K units with full recruitment. No fibrillations or positive waves were noted. The biceps muscle reveals 1 to 2 K units with full recruitment. No fibrillations or positive waves were noted. The triceps muscle reveals 2 to 4 K units with full recruitment. No fibrillations or positive waves were noted. The anterior deltoid muscle reveals 2 to 3 K units with full recruitment. No fibrillations or positive waves were  noted. The cervical paraspinal muscles were tested at 2 levels. No abnormalities of insertional activity were seen at either level tested. There was good relaxation.   IMPRESSION:  Nerve conduction studies done on both upper extremities shows evidence of mild carpal tunnel syndrome on the right. EMG evaluation of the left upper extremity was unremarkable, without evidence of a myopathic or neuropathic changes. No evidence of an overlying radiculopathy is seen.  Jill Alexanders MD 04/17/2015 4:52 PM  [facility] [address] [place], [place] [address]  Phone [phone] Fax [phone]

## 2015-04-17 NOTE — Progress Notes (Signed)
Please refer to EMG and nerve conduction study procedure note. 

## 2015-04-28 ENCOUNTER — Telehealth: Payer: Self-pay | Admitting: Neurology

## 2015-04-28 NOTE — Telephone Encounter (Signed)
Patient is calling and would like a referral to Dr. Boris Lown @[phone] [phone]. Please call patient.

## 2015-04-29 DIAGNOSIS — M542 Cervicalgia: Secondary | ICD-10-CM | POA: Diagnosis not present

## 2015-04-29 DIAGNOSIS — M6281 Muscle weakness (generalized): Secondary | ICD-10-CM | POA: Diagnosis not present

## 2015-04-29 DIAGNOSIS — R700 Elevated erythrocyte sedimentation rate: Secondary | ICD-10-CM | POA: Diagnosis not present

## 2015-04-29 DIAGNOSIS — M059 Rheumatoid arthritis with rheumatoid factor, unspecified: Secondary | ICD-10-CM | POA: Diagnosis not present

## 2015-04-29 DIAGNOSIS — R5383 Other fatigue: Secondary | ICD-10-CM | POA: Diagnosis not present

## 2015-04-29 NOTE — Telephone Encounter (Signed)
Returned pt TC. Left VM mssg. Pt had NCV/EMG 04/17/15 that showed mild CTS. Hx of osteoarthritis. Requesting referral to rheumatology.

## 2015-04-29 NOTE — Telephone Encounter (Signed)
I called patient back, the patient was only seen for EMG and nerve conduction study evaluation, she is not an established patient to this office. She will need to contact her primary doctor concerning the referral.

## 2015-05-09 DIAGNOSIS — E039 Hypothyroidism, unspecified: Secondary | ICD-10-CM | POA: Diagnosis not present

## 2015-05-12 ENCOUNTER — Ambulatory Visit (INDEPENDENT_AMBULATORY_CARE_PROVIDER_SITE_OTHER): Payer: PPO | Admitting: Family Medicine

## 2015-05-12 ENCOUNTER — Encounter: Payer: Self-pay | Admitting: Family Medicine

## 2015-05-12 VITALS — BP 160/76 | HR 76 | Temp 98.4°F | Ht 67.5 in | Wt 189.0 lb

## 2015-05-12 DIAGNOSIS — S81812A Laceration without foreign body, left lower leg, initial encounter: Secondary | ICD-10-CM

## 2015-05-12 MED ORDER — CEPHALEXIN 500 MG PO CAPS: 500.0000 mg | ORAL_CAPSULE | Freq: Three times a day (TID) | ORAL | Status: AC

## 2015-05-12 NOTE — Progress Notes (Signed)
   Subjective:    Patient ID: Rachael [name], female    DOB: 1933-05-27, 82 y.o.   MRN: [id number]  HPI Here for an injury to the left leg which occurred at home last night. She struck her leg against the corner of a vanity drawer that was open. The area bled quite a bit but she was able to stop it with direct pressure. Today it is not painful.    Review of Systems  Constitutional: Negative.   Skin: Positive for wound.       Objective:   Physical Exam  Constitutional: She appears well-developed and well-nourished.  Skin:  The anterior left lower leg has a trifold laceration which is not actively bleeding. The area is slightly tender.           Assessment & Plan:  Laceration, dress with Neosporin and gauze. Cover with Keflex.  [name], MD

## 2015-05-12 NOTE — Progress Notes (Signed)
Pre visit review using our clinic review tool, if applicable. No additional management support is needed unless otherwise documented below in the visit note. 

## 2015-05-13 ENCOUNTER — Telehealth: Payer: Self-pay | Admitting: Family Medicine

## 2015-05-13 NOTE — Telephone Encounter (Signed)
Tell her to go back to 10 mg at least for now

## 2015-05-13 NOTE — Telephone Encounter (Signed)
I spoke with pt and went over below message.  

## 2015-05-13 NOTE — Telephone Encounter (Signed)
Pt states her hand and feet are very swollen this am. Pt went back on 5mg  of prednisone last night and wonders if she should not take 10 mg instead of the 5 mg as Dr Sarajane Jews instructed yesterday.. Pt is concerned she may not be able to move.

## 2015-05-16 DIAGNOSIS — G729 Myopathy, unspecified: Secondary | ICD-10-CM | POA: Diagnosis not present

## 2015-05-16 DIAGNOSIS — E039 Hypothyroidism, unspecified: Secondary | ICD-10-CM | POA: Diagnosis not present

## 2015-05-16 DIAGNOSIS — E063 Autoimmune thyroiditis: Secondary | ICD-10-CM | POA: Diagnosis not present

## 2015-05-16 DIAGNOSIS — E559 Vitamin D deficiency, unspecified: Secondary | ICD-10-CM | POA: Diagnosis not present

## 2015-05-16 DIAGNOSIS — R7309 Other abnormal glucose: Secondary | ICD-10-CM | POA: Diagnosis not present

## 2015-05-16 DIAGNOSIS — C50819 Malignant neoplasm of overlapping sites of unspecified female breast: Secondary | ICD-10-CM | POA: Diagnosis not present

## 2015-05-20 ENCOUNTER — Encounter: Payer: Self-pay | Admitting: Family Medicine

## 2015-05-20 ENCOUNTER — Ambulatory Visit (INDEPENDENT_AMBULATORY_CARE_PROVIDER_SITE_OTHER): Payer: PPO | Admitting: Family Medicine

## 2015-05-20 VITALS — BP 121/71 | HR 74 | Temp 98.9°F | Ht 66.5 in | Wt 185.5 lb

## 2015-05-20 DIAGNOSIS — Z0000 Encounter for general adult medical examination without abnormal findings: Secondary | ICD-10-CM

## 2015-05-20 DIAGNOSIS — M1990 Unspecified osteoarthritis, unspecified site: Secondary | ICD-10-CM | POA: Diagnosis not present

## 2015-05-20 MED ORDER — "AQUACEL FOAM 4""X4"" EX PADS": 1.0000 "application " | MEDICATED_PAD | Freq: Every day | CUTANEOUS | Status: DC

## 2015-05-20 NOTE — Progress Notes (Signed)
   Subjective:    Patient ID: Rachael [name], female    DOB: 12-18-1933, 82 y.o.   MRN: [id number]  HPI 82 yr old female for a well exam. She is doing well other than her usual joint stiffness and pain. She has been diagnosed with rheumatoid arthritis by several local rheumatologists, and she has been told by others that she does not have it. She does respond to prednisone therapy. She asks to be referred to a rheumatology specialist in [facility] by the name of Dr. Leavy Cella. We saw her a week ago for a laceration on the left lower leg. She has been dressing this daily and taking antibiotics. It seems to be healing nicely.   Review of Systems  Constitutional: Negative.   HENT: Negative.   Eyes: Negative.   Respiratory: Negative.   Cardiovascular: Negative.   Gastrointestinal: Negative.   Genitourinary: Negative for dysuria, urgency, frequency, hematuria, flank pain, decreased urine volume, enuresis, difficulty urinating, pelvic pain and dyspareunia.  Musculoskeletal: Positive for joint swelling, arthralgias and gait problem.  Skin: Negative.   Neurological: Negative.   Psychiatric/Behavioral: Negative.        Objective:   Physical Exam  Constitutional: She is oriented to person, place, and time. She appears well-developed and well-nourished. No distress.  Walks with a cane   HENT:  Head: Normocephalic and atraumatic.  Right Ear: External ear normal.  Left Ear: External ear normal.  Nose: Nose normal.  Mouth/Throat: Oropharynx is clear and moist. No oropharyngeal exudate.  Eyes: Conjunctivae and EOM are normal. Pupils are equal, round, and reactive to light. No scleral icterus.  Neck: Normal range of motion. Neck supple. No JVD present. No thyromegaly present.  Cardiovascular: Normal rate, regular rhythm, normal heart sounds and intact distal pulses.  Exam reveals no gallop and no friction rub.   No murmur heard. EKG normal   Pulmonary/Chest: Effort normal and breath sounds  normal. No respiratory distress. She has no wheezes. She has no rales. She exhibits no tenderness.  Abdominal: Soft. Bowel sounds are normal. She exhibits no distension and no mass. There is no tenderness. There is no rebound and no guarding.  Musculoskeletal: Normal range of motion. She exhibits no edema or tenderness.  Lymphadenopathy:    She has no cervical adenopathy.  Neurological: She is alert and oriented to person, place, and time. She has normal reflexes. No cranial nerve deficit. She exhibits normal muscle tone. Coordination normal.  Skin: Skin is warm and dry. No rash noted. No erythema.  The wound on the left lower leg is healing in well. No erythema   Psychiatric: She has a normal mood and affect. Her behavior is normal. Judgment and thought content normal.          Assessment & Plan:  Well exam. She will set up a fasting lipid panel soon. The wound is healing well. We will refer her to Dr. Boris Lown soon.  Rachael Morale, MD

## 2015-05-20 NOTE — Progress Notes (Signed)
Pre visit review using our clinic review tool, if applicable. No additional management support is needed unless otherwise documented below in the visit note. 

## 2015-05-20 NOTE — Addendum Note (Signed)
Addended by: Alysia Penna A on: 05/20/2015 02:59 PM   Modules accepted: Orders

## 2015-05-26 ENCOUNTER — Telehealth: Payer: Self-pay | Admitting: Family Medicine

## 2015-05-26 NOTE — Telephone Encounter (Signed)
Pt would like a lab order  PGX ( pharma cogenomic)  Pt states it is a test that will tell her what meds she is allergic to.

## 2015-05-26 NOTE — Telephone Encounter (Signed)
I left a voice message with below advice.

## 2015-05-26 NOTE — Telephone Encounter (Signed)
I have never heard of this test and I doubt its usefulness. I suggest a referral to an Allergist if she wants full testing done

## 2015-05-27 DIAGNOSIS — R768 Other specified abnormal immunological findings in serum: Secondary | ICD-10-CM | POA: Diagnosis not present

## 2015-05-27 DIAGNOSIS — M6281 Muscle weakness (generalized): Secondary | ICD-10-CM | POA: Diagnosis not present

## 2015-05-29 DIAGNOSIS — M059 Rheumatoid arthritis with rheumatoid factor, unspecified: Secondary | ICD-10-CM | POA: Diagnosis not present

## 2015-05-29 DIAGNOSIS — M47812 Spondylosis without myelopathy or radiculopathy, cervical region: Secondary | ICD-10-CM | POA: Diagnosis not present

## 2015-06-05 ENCOUNTER — Telehealth: Payer: Self-pay | Admitting: Family Medicine

## 2015-06-05 DIAGNOSIS — M059 Rheumatoid arthritis with rheumatoid factor, unspecified: Secondary | ICD-10-CM | POA: Diagnosis not present

## 2015-06-05 DIAGNOSIS — M47812 Spondylosis without myelopathy or radiculopathy, cervical region: Secondary | ICD-10-CM | POA: Diagnosis not present

## 2015-06-05 NOTE — Telephone Encounter (Signed)
Pt would like you to call her. Per pt,  Dr. Dawayne Patricia. Dossie Der, MD would like to know what her last blood sugar levels were.Marland Kitchen

## 2015-06-06 NOTE — Telephone Encounter (Signed)
I spoke with pt and gave below information.  

## 2015-06-19 DIAGNOSIS — M059 Rheumatoid arthritis with rheumatoid factor, unspecified: Secondary | ICD-10-CM | POA: Diagnosis not present

## 2015-06-19 DIAGNOSIS — M1189 Other specified crystal arthropathies, multiple sites: Secondary | ICD-10-CM | POA: Diagnosis not present

## 2015-06-19 DIAGNOSIS — M1990 Unspecified osteoarthritis, unspecified site: Secondary | ICD-10-CM | POA: Diagnosis not present

## 2015-06-19 DIAGNOSIS — M47812 Spondylosis without myelopathy or radiculopathy, cervical region: Secondary | ICD-10-CM | POA: Diagnosis not present

## 2015-07-16 DIAGNOSIS — L821 Other seborrheic keratosis: Secondary | ICD-10-CM | POA: Diagnosis not present

## 2015-07-16 DIAGNOSIS — D1801 Hemangioma of skin and subcutaneous tissue: Secondary | ICD-10-CM | POA: Diagnosis not present

## 2015-07-16 DIAGNOSIS — D225 Melanocytic nevi of trunk: Secondary | ICD-10-CM | POA: Diagnosis not present

## 2015-07-16 DIAGNOSIS — L570 Actinic keratosis: Secondary | ICD-10-CM | POA: Diagnosis not present

## 2015-07-16 DIAGNOSIS — Z85828 Personal history of other malignant neoplasm of skin: Secondary | ICD-10-CM | POA: Diagnosis not present

## 2015-07-16 DIAGNOSIS — L812 Freckles: Secondary | ICD-10-CM | POA: Diagnosis not present

## 2015-07-17 DIAGNOSIS — M059 Rheumatoid arthritis with rheumatoid factor, unspecified: Secondary | ICD-10-CM | POA: Diagnosis not present

## 2015-07-17 DIAGNOSIS — M1189 Other specified crystal arthropathies, multiple sites: Secondary | ICD-10-CM | POA: Diagnosis not present

## 2015-07-17 DIAGNOSIS — M47812 Spondylosis without myelopathy or radiculopathy, cervical region: Secondary | ICD-10-CM | POA: Diagnosis not present

## 2015-07-17 DIAGNOSIS — M1990 Unspecified osteoarthritis, unspecified site: Secondary | ICD-10-CM | POA: Diagnosis not present

## 2015-07-23 DIAGNOSIS — H5203 Hypermetropia, bilateral: Secondary | ICD-10-CM | POA: Diagnosis not present

## 2015-07-23 DIAGNOSIS — H52223 Regular astigmatism, bilateral: Secondary | ICD-10-CM | POA: Diagnosis not present

## 2015-07-23 DIAGNOSIS — H25813 Combined forms of age-related cataract, bilateral: Secondary | ICD-10-CM | POA: Diagnosis not present

## 2015-07-28 ENCOUNTER — Telehealth: Payer: Self-pay | Admitting: Family Medicine

## 2015-07-28 NOTE — Telephone Encounter (Signed)
Pt was taking 20 mg of prednisone in hospital  Pt was on prednisone 10 mg and dr fry stop med abruptly. Pt saw specialist syeed and doing prednisone taper for over 8 weeks. Pt would like to know why she is going a tapering and not stop abruptly like dr fry did. Pt is aware to consult with dr syeed office she did and would like dr fry advise.

## 2015-07-28 NOTE — Telephone Encounter (Signed)
I am sure what the other doctor is trying to prevent is a rebound effect, so the symptoms are less likely to come back. I advise Rachael [name] to follow those instructions

## 2015-07-30 NOTE — Telephone Encounter (Signed)
I spoke with pt and gave below message.  

## 2015-09-17 DIAGNOSIS — M170 Bilateral primary osteoarthritis of knee: Secondary | ICD-10-CM | POA: Diagnosis not present

## 2015-09-17 DIAGNOSIS — M059 Rheumatoid arthritis with rheumatoid factor, unspecified: Secondary | ICD-10-CM | POA: Diagnosis not present

## 2015-09-17 DIAGNOSIS — M47812 Spondylosis without myelopathy or radiculopathy, cervical region: Secondary | ICD-10-CM | POA: Diagnosis not present

## 2015-09-17 DIAGNOSIS — M1189 Other specified crystal arthropathies, multiple sites: Secondary | ICD-10-CM | POA: Diagnosis not present

## 2015-09-17 DIAGNOSIS — M1990 Unspecified osteoarthritis, unspecified site: Secondary | ICD-10-CM | POA: Diagnosis not present

## 2015-09-29 ENCOUNTER — Other Ambulatory Visit: Payer: PPO

## 2015-09-30 ENCOUNTER — Other Ambulatory Visit: Payer: PPO

## 2015-09-30 ENCOUNTER — Ambulatory Visit: Payer: PPO | Admitting: Oncology

## 2015-09-30 DIAGNOSIS — M79671 Pain in right foot: Secondary | ICD-10-CM | POA: Diagnosis not present

## 2015-09-30 DIAGNOSIS — M47812 Spondylosis without myelopathy or radiculopathy, cervical region: Secondary | ICD-10-CM | POA: Diagnosis not present

## 2015-09-30 DIAGNOSIS — M059 Rheumatoid arthritis with rheumatoid factor, unspecified: Secondary | ICD-10-CM | POA: Diagnosis not present

## 2015-09-30 DIAGNOSIS — M7731 Calcaneal spur, right foot: Secondary | ICD-10-CM | POA: Diagnosis not present

## 2015-09-30 DIAGNOSIS — M79673 Pain in unspecified foot: Secondary | ICD-10-CM | POA: Diagnosis not present

## 2015-10-01 DIAGNOSIS — E559 Vitamin D deficiency, unspecified: Secondary | ICD-10-CM | POA: Diagnosis not present

## 2015-10-01 DIAGNOSIS — E018 Other iodine-deficiency related thyroid disorders and allied conditions: Secondary | ICD-10-CM | POA: Diagnosis not present

## 2015-10-01 DIAGNOSIS — E538 Deficiency of other specified B group vitamins: Secondary | ICD-10-CM | POA: Diagnosis not present

## 2015-10-01 DIAGNOSIS — R5383 Other fatigue: Secondary | ICD-10-CM | POA: Diagnosis not present

## 2015-10-01 DIAGNOSIS — R5382 Chronic fatigue, unspecified: Secondary | ICD-10-CM | POA: Diagnosis not present

## 2015-10-02 ENCOUNTER — Ambulatory Visit (INDEPENDENT_AMBULATORY_CARE_PROVIDER_SITE_OTHER): Payer: PPO | Admitting: Family Medicine

## 2015-10-02 ENCOUNTER — Other Ambulatory Visit (INDEPENDENT_AMBULATORY_CARE_PROVIDER_SITE_OTHER): Payer: PPO

## 2015-10-02 ENCOUNTER — Encounter: Payer: Self-pay | Admitting: Family Medicine

## 2015-10-02 VITALS — BP 125/65 | HR 53 | Temp 98.2°F

## 2015-10-02 DIAGNOSIS — Z0000 Encounter for general adult medical examination without abnormal findings: Secondary | ICD-10-CM | POA: Diagnosis not present

## 2015-10-02 DIAGNOSIS — L03115 Cellulitis of right lower limb: Secondary | ICD-10-CM

## 2015-10-02 DIAGNOSIS — E58 Dietary calcium deficiency: Secondary | ICD-10-CM

## 2015-10-02 LAB — LIPID PANEL
CHOL/HDL RATIO: 2
Cholesterol: 165 mg/dL (ref 0–200)
HDL: 71.7 mg/dL (ref >39.00)
LDL Cholesterol: 77 mg/dL (ref 0–99)
NONHDL: 93.68
Triglycerides: 82 mg/dL (ref 0.0–149.0)
VLDL: 16.4 mg/dL (ref 0.0–40.0)

## 2015-10-02 NOTE — Progress Notes (Signed)
Pre visit review using our clinic review tool, if applicable. No additional management support is needed unless otherwise documented below in the visit note. Pt unable to weigh 

## 2015-10-02 NOTE — Progress Notes (Signed)
   Subjective:    Patient ID: Rachael [name], female    DOB: 07-Jan-1934, 82 y.o.   MRN: [id number]  HPI Here for 2 things. First she developed redness and swelling in the right lower leg about a week ago, and when she saw Dr. Dossie Der (her rheumatologist) 3 days ago she was diagnosed with cellulitis. She was given a 10 day course of Augmentin, and the area already looks much better. No pain or fever. Second the technologist in Dr. Audelia Hives office did an [place] on her foot that day and mentioned that some of her veins had calcium in them. Now Rachael [name] is concerned and asks me to evaluate her calcium status. Her last serum level I can see in her chart was normal at 10.1 in April. She used to take a calcium supplement with her other vitamins every day, but she stopped this years ago. Now she just take "Juice Plus" every day, which has an unknown amount of calcium in it.    Review of Systems  Constitutional: Negative.   Respiratory: Negative.   Cardiovascular: Positive for leg swelling. Negative for chest pain and palpitations.  Endocrine: Negative.   Skin: Positive for color change.  Neurological: Negative.        Objective:   Physical Exam  Constitutional: She appears well-developed and well-nourished.  Walks with her walker   Cardiovascular: Normal rate, regular rhythm, normal heart sounds and intact distal pulses.   Pulmonary/Chest: Effort normal and breath sounds normal. No respiratory distress. She has no wheezes. She has no rales.  Musculoskeletal:  Both ankles have 2+ edema   Skin:  Right lower leg has an area of faint macular erythema, it is not warm or tender           Assessment & Plan:  The cellulitis is responding well to Augmentin, so I advised her to finish this out and recheck prn. As for her calcium status, we will draw a PTH with calcium today.  [name], MD

## 2015-10-03 ENCOUNTER — Other Ambulatory Visit: Payer: PPO

## 2015-10-03 ENCOUNTER — Telehealth: Payer: Self-pay | Admitting: Family Medicine

## 2015-10-03 ENCOUNTER — Ambulatory Visit: Payer: PPO | Admitting: Family Medicine

## 2015-10-03 DIAGNOSIS — R7309 Other abnormal glucose: Secondary | ICD-10-CM | POA: Diagnosis not present

## 2015-10-03 DIAGNOSIS — E039 Hypothyroidism, unspecified: Secondary | ICD-10-CM | POA: Diagnosis not present

## 2015-10-03 DIAGNOSIS — C50819 Malignant neoplasm of overlapping sites of unspecified female breast: Secondary | ICD-10-CM | POA: Diagnosis not present

## 2015-10-03 DIAGNOSIS — G729 Myopathy, unspecified: Secondary | ICD-10-CM | POA: Diagnosis not present

## 2015-10-03 DIAGNOSIS — E559 Vitamin D deficiency, unspecified: Secondary | ICD-10-CM | POA: Diagnosis not present

## 2015-10-03 DIAGNOSIS — E063 Autoimmune thyroiditis: Secondary | ICD-10-CM | POA: Diagnosis not present

## 2015-10-03 LAB — PTH, INTACT AND CALCIUM
CALCIUM: 10 mg/dL (ref 8.6–10.4)
PTH: 43 pg/mL (ref 14–64)

## 2015-10-03 NOTE — Telephone Encounter (Signed)
Noted. The results are ready to fax

## 2015-10-03 NOTE — Telephone Encounter (Signed)
I spoke with Rachael [name], went over results, faxed results to below number and Dr. Sarajane Jews does want Rachael [name] to take Amoxicillin twice a day.

## 2015-10-03 NOTE — Telephone Encounter (Signed)
Pt is giving fax number labs are to be sent to from yesterday:  Dr Shella Spearing Phone [phone] Fax: [phone]  Pt was to let Dr Sarajane Jews know the 2 scripts she was given: 1. Amoxicillin trihydate 875 mg  Pt to take 2/ day for 10 days  Pt thought it was for only 5 days, but it is for 10 days   2. Colchicine .6 mg 1 / day

## 2015-10-06 ENCOUNTER — Ambulatory Visit: Payer: PPO | Admitting: Family Medicine

## 2015-10-10 ENCOUNTER — Other Ambulatory Visit: Payer: Self-pay | Admitting: Oncology

## 2015-10-10 DIAGNOSIS — Z853 Personal history of malignant neoplasm of breast: Secondary | ICD-10-CM

## 2015-10-15 ENCOUNTER — Other Ambulatory Visit (HOSPITAL_BASED_OUTPATIENT_CLINIC_OR_DEPARTMENT_OTHER): Payer: PPO

## 2015-10-15 DIAGNOSIS — Z853 Personal history of malignant neoplasm of breast: Secondary | ICD-10-CM

## 2015-10-15 DIAGNOSIS — C50911 Malignant neoplasm of unspecified site of right female breast: Secondary | ICD-10-CM

## 2015-10-15 LAB — CBC WITH DIFFERENTIAL/PLATELET
BASO%: 0.2 % (ref 0.0–2.0)
Basophils Absolute: 0 10*3/uL (ref 0.0–0.1)
EOS ABS: 0.3 10*3/uL (ref 0.0–0.5)
EOS%: 2.7 % (ref 0.0–7.0)
HCT: 40.5 % (ref 34.8–46.6)
HGB: 13.1 g/dL (ref 11.6–15.9)
LYMPH%: 23 % (ref 14.0–49.7)
MCH: 28.2 pg (ref 25.1–34.0)
MCHC: 32.3 g/dL (ref 31.5–36.0)
MCV: 87.3 fL (ref 79.5–101.0)
MONO#: 1.1 10*3/uL — ABNORMAL HIGH (ref 0.1–0.9)
MONO%: 12.4 % (ref 0.0–14.0)
NEUT%: 61.7 % (ref 38.4–76.8)
NEUTROS ABS: 5.7 10*3/uL (ref 1.5–6.5)
Platelets: 238 10*3/uL (ref 145–400)
RBC: 4.64 10*6/uL (ref 3.70–5.45)
RDW: 14.8 % — ABNORMAL HIGH (ref 11.2–14.5)
WBC: 9.2 10*3/uL (ref 3.9–10.3)
lymph#: 2.1 10*3/uL (ref 0.9–3.3)

## 2015-10-15 LAB — COMPREHENSIVE METABOLIC PANEL
ALT: 12 U/L (ref 0–55)
AST: 16 U/L (ref 5–34)
Albumin: 3.5 g/dL (ref 3.5–5.0)
Alkaline Phosphatase: 77 U/L (ref 40–150)
Anion Gap: 11 mEq/L (ref 3–11)
BUN: 21.4 mg/dL (ref 7.0–26.0)
CHLORIDE: 104 meq/L (ref 98–109)
CO2: 28 meq/L (ref 22–29)
Calcium: 10.3 mg/dL (ref 8.4–10.4)
Creatinine: 0.8 mg/dL (ref 0.6–1.1)
EGFR: 69 mL/min/{1.73_m2} — AB (ref >90)
GLUCOSE: 94 mg/dL (ref 70–140)
POTASSIUM: 4.1 meq/L (ref 3.5–5.1)
SODIUM: 143 meq/L (ref 136–145)
TOTAL PROTEIN: 7.1 g/dL (ref 6.4–8.3)
Total Bilirubin: 1.44 mg/dL — ABNORMAL HIGH (ref 0.20–1.20)

## 2015-10-16 ENCOUNTER — Ambulatory Visit
Admission: RE | Admit: 2015-10-16 | Discharge: 2015-10-16 | Disposition: A | Discharge status: Home / Self Care | Payer: PPO | Source: Ambulatory Visit | Attending: Oncology | Admitting: Oncology

## 2015-10-16 DIAGNOSIS — R928 Other abnormal and inconclusive findings on diagnostic imaging of breast: Secondary | ICD-10-CM | POA: Diagnosis not present

## 2015-10-16 DIAGNOSIS — Z853 Personal history of malignant neoplasm of breast: Secondary | ICD-10-CM

## 2015-10-22 ENCOUNTER — Ambulatory Visit (HOSPITAL_BASED_OUTPATIENT_CLINIC_OR_DEPARTMENT_OTHER): Payer: PPO | Admitting: Oncology

## 2015-10-22 VITALS — BP 153/60 | HR 67 | Temp 97.9°F | Resp 18 | Ht 66.5 in

## 2015-10-22 DIAGNOSIS — Z170 Estrogen receptor positive status [ER+]: Principal | ICD-10-CM

## 2015-10-22 DIAGNOSIS — Z853 Personal history of malignant neoplasm of breast: Secondary | ICD-10-CM | POA: Diagnosis not present

## 2015-10-22 DIAGNOSIS — C50911 Malignant neoplasm of unspecified site of right female breast: Secondary | ICD-10-CM

## 2015-10-22 NOTE — Progress Notes (Signed)
ID: [name]   DOB: 1933/07/03  MR#: [id number]  JJK#:[id number]  PCP: [name], MD GYN: Dian Queen, MD SU: Autumn Messing, MD OTHER MD:  Minus Breeding, MD;  Nena Polio, MD; Thea Silversmith, MD;  Lucio Edward, MD, Sabino Niemann MD  CHIEF COMPLAINT:  Right Breast Cancer  CURRENT TREATMENT: Observation  BREAST CANCER HISTORY: From the original intake note:  Markiyah had routine screening mammography at [facility] 05/06/2011, showing an area of focal asymmetry in the lower inner quadrant of the right breast. Additional views 05/10/2011 confirmed a 9 mm area of irregularity without associated microcalcifications. Ultrasound of the right breast confirmed a 9 mm hypoechoic mass with posterior acoustic shadowing. Biopsy of this mass was performed the next day, and showed ([id number]) and invasive lobular carcinoma, E-cadherin negative, which was estrogen receptor positive at 97%, and progesterone receptor positive at 81%. There was no HER-2 amplification, and MIB-1 was 12%.  On may 07/31/2011 him of the patient underwent breast MRI, which showed a 1.5 cm mildly irregular enhancing mass in the upper inner quadrant of the right breast. There were no other masses of concern and no abnormal appearing lymph nodes. Accordingly, on 06/16/2011 the patient underwent right lumpectomy and sentinel lymph node sampling under Dr. Star Age, the final pathology (SZ [id number]) showing an invasive lobular carcinoma, grade 1, measuring 1.5 cm, with both sentinel lymph nodes negative.   The patient's subsequent history is as detailed below.  INTERVAL HISTORY: Tyquisha returns today for follow up of her estrogen receptor positive breast cancer. We have not been able to treat her with anti-estrogens because of side effects. On the other hand she is treating herself with juice plus products and she is doing very well with that. She tells me her thyroid is much improved, she is losing weight without barely trying, and she  is coming off her prednisone, currently at 5 mg every other day.    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 At home she uses her exercise bike. She denies any unusual headaches, visual changes, nausea, vomiting, or unusual pain, fever, she does bruise easily. A detailed review of systems today was otherwise stable.  PAST MEDICAL HISTORY: Past Medical History:  Diagnosis Date  . Breast cancer (HCC)    R  . CAROTID STENOSIS   . Chronic venous insufficiency   . CONTACT DERMATITIS   . DUPUYTREN'S CONTRACTURE   . Edema    pt  states had edema of feet and legs has been on lasix per dr. to help  . GLUCOSE INTOLERANCE   . HLD (hyperlipidemia)   . Hypothyroidism    Dr. Ala Dach in [place]   . MENOPAUSAL SYNDROME   . Obesity   . Osteoarthritis    bilat hips, severe  . PVD (peripheral vascular disease) ([place])    mild carotid 11/05  . VITAMIN D DEFICIENCY     PAST SURGICAL HISTORY: Past Surgical History:  Procedure Laterality Date  . 2D echo  11/29/03   normal LV size. normal EF   . [place]   lumbarectomy  . BREAST BIOPSY  05/11/11   right breast  . BREAST LUMPECTOMY     right breast  . CHOLECYSTECTOMY    . FINGER SURGERY    . HERNIA  REPAIR     incisional hernia  . INGUINAL HERNIA REPAIR    . JOINT REPLACEMENT Bilateral    Dr. Maureen Ralphs  . normal caronary arteries     per pt. 2003  . right rotator cuff surgery    . TONSILLECTOMY    . TOTAL HIP ARTHROPLASTY  05/2008 and 10/2008   B hip - alusio    FAMILY HISTORY Family History  Problem Relation Age of Onset  . Coronary artery disease      family hx - female 1st degree relative <60  . Hypothyroidism      aunt   . Uterine cancer Mother 66  . Cancer Mother   . Breast cancer Daughter 65    BRCA negative (no BART)   . Cancer Daughter   . Pancreatic cancer Maternal Grandfather     diagnosed in his 12s  . Anesthesia problems Neg Hx   . Prostate cancer Maternal Uncle     diagnosed in his 76s  . Colon cancer Cousin   . Prostate cancer Paternal Uncle     diagnosed in his 72s  . Clotting disorder Maternal Grandmother   . Uterine cancer Cousin   . Leukemia Cousin   . Prostate cancer Cousin   . Heart disease Father     before age 58  . Varicose Veins Father   . Heart disease Brother     before age 88  . Cancer Sister    the patient's father died at the age of 80 from heart disease. The patient's mother died in 53 at the age of 49. She had uterine cancer in her 63s. There is a significant cancer family on her side, to be detailed when the patient meets with her genetics counselor. The patient herself has no sisters, does have 3 brothers. One of the patient's daughters, [name], was diagnosed with breast cancer at the age of 63. She was tested for the BRCA gene and was negative. There is no other breast or ovarian cancer in the immediate family to her knowledge.  GYNECOLOGIC HISTORY: Menarche age 89, menopause age 83. The patient tried Prempro briefly but did not like it. She is GX P5, first live birth age 57.  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 She is divorced and lives by herself. 3 of her 5 children live in town ([place], [place], and [place]; [name] is also followed here w a history of breast cancer) and 2 live in [place] and [place]). She has 7 grandchildren. She attends the [employer] [place] [employer].   ADVANCED DIRECTIVES: Not in place  HEALTH MAINTENANCE:  (Updated December 2014) Social History  Substance Use Topics  . Smoking status: Never Smoker  . Smokeless tobacco: Never Used  . Alcohol use No     Colonoscopy: 2009/[facility]  PAP: Sept 2014/[facility]  B[facility]: [facility], Normal  Lipid  panel: Sept 2014/ [facility]    Allergies  Allergen Reactions  . Shellfish Allergy Anaphylaxis and Hives    Hives all over the body.  . Percocet [Oxycodone-Acetaminophen] Hives  . Iodinated Diagnostic Agents     PT IS NOT AWARE OF IODINE ALLERGY, PREMEDICATED FOR PRIOR PREMEDS ONLY//A.C.  . Iodine Hives  . Prednisone     nausea    Current Outpatient Prescriptions  Medication Sig Dispense Refill  . Cholecalciferol (VITAMIN D3) 2000 units TABS Take 5,000 mg by mouth daily.    Marland Kitchen  Cyanocobalamin (B-12) 2500 MCG TABS Take 1 tablet by mouth daily.    Marland Kitchen levothyroxine (SYNTHROID, LEVOTHROID) 137 MCG tablet Take 137 mcg by mouth daily.     . Nutritional Supplements (JUICE PLUS FIBRE) LIQD Take 4 capsules by mouth 3 (three) times daily. 4 vegetable capsules with breakfast, 4 fruit capsules with lunch and 4 berry capsules with dinner.    . predniSONE (DELTASONE) 5 MG tablet Take 5 mg by mouth daily with breakfast.      No current facility-administered medications for this visit.     OBJECTIVE: Middle-aged white woman Who appears stated age.   Vitals:   10/22/15 1309  BP: (!) 153/60  Pulse: 67  Resp: 18  Temp: 97.9 F (36.6 C)     There is no height or weight on file to calculate BMI.    ECOG FS: 1 Filed Weights   Sclerae unicteric, EOMs intact Oropharynx clear and moist No cervical or supraclavicular adenopathy Lungs no rales or rhonchi Heart regular rate and rhythm Abd soft, nontender, positive bowel sounds MSK no focal spinal tenderness, no upper extremity lymphedema Neuro: nonfocal, well oriented, appropriate affect Breasts: The right breast is status post lumpectomy. There are no suspicious findings. The right axilla is benign. The left breast is unremarkable.    LAB RESULTS: Lab Results  Component Value Date   WBC 9.2 10/15/2015   NEUTROABS 5.7 10/15/2015   HGB 13.1 10/15/2015   HCT 40.5 10/15/2015   MCV 87.3 10/15/2015   PLT 238 10/15/2015      Chemistry       Component Value Date/Time   NA 143 10/15/2015 0943   K 4.1 10/15/2015 0943   CL 107 03/19/2015 0340   CL 104 06/20/2012 1421   CO2 28 10/15/2015 0943   BUN 21.4 10/15/2015 0943   CREATININE 0.8 10/15/2015 0943   GLU 141 03/31/2015      Component Value Date/Time   CALCIUM 10.3 10/15/2015 0943   ALKPHOS 77 10/15/2015 0943   AST 16 10/15/2015 0943   ALT 12 10/15/2015 0943   BILITOT 1.44 (H) 10/15/2015 0943       STUDIES: Mm Diag Breast Tomo Bilateral  Result Date: 10/16/2015 CLINICAL DATA:  82 year old female presenting for annual follow-up status post right breast lumpectomy in 2013. The patient did not have chemotherapy or radiation therapy. She discontinued Anastrozole in August of 2016. EXAM: 2D DIGITAL DIAGNOSTIC BILATERAL MAMMOGRAM WITH CAD AND ADJUNCT TOMO COMPARISON:  Previous exam(s). ACR Breast Density Category b: There are scattered areas of fibroglandular density. FINDINGS: The right breast lumpectomy site is stable. No suspicious calcifications, masses or areas of distortion are seen in the bilateral breasts. Mammographic images were processed with CAD. IMPRESSION: Stable right breast lumpectomy site. No mammographic evidence of malignancy in the bilateral breasts. RECOMMENDATION: Diagnostic mammogram is suggested in 1 year. (Code:DM-B-01Y) I have discussed the findings and recommendations with the patient. Results were also provided in writing at the conclusion of the visit. If applicable, a reminder letter will be sent to the patient regarding the next appointment. BI-RADS CATEGORY  2: Benign. Electronically Signed   By: Ammie Ferrier M.D.   On: 10/16/2015 11:31    ASSESSMENT: 82 y.o. [place] woman   (1)  s/p Right lumpectomy and sentinel lymph node sampling 06/16/2011 for a T1c N0, stage IA invasive lobular breast cancer, grade 1, strongly estrogen and progesterone positive, with no HER-2 amplification, and an Mib-1 of 12%  (2) the patient met with radiation  oncology  07/14/2011, but opted against adjuvant radiation  (3) started anastrozole July 2013, briefly discontinued because of side effects but resumed October 2013. Stopped October 2016.  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 other genes tested were normal and included APC, ATM, BARD1, BMPR1A, BRCA1, BRCA2, BRIP1, CDH1, CHEK2, EPCAM, MLH1, MRE11A, MSH6, MUTYH, NBN, PALB2, PMS2, PTEN, RAD50, RAD51C, SMAD4, STK11 and TP53  PLAN:  Charliene is now a little over 4 years out from definitive surgery for her breast cancer with no evidence of disease recurrence. This is very favorable.  She is a major proponent of juice plus and apparently has some I counts with some physicians. I don't think we are going to proceed with that here but I encouraged her to keep up the good work.  She is not enthusiastic are really interested in proceeding to anti-estrogens at any point since she is doing so well and her cancer is so remote.  Accordingly at this point the plan is to continue observation. She will see me one more time a year from now. At that point she will either "graduate" from follow-up or transition to survivorship.  She knows to call for any problems that may develop before her next visit. Chauncey Cruel, MD  10/22/2015

## 2015-11-06 DIAGNOSIS — M1189 Other specified crystal arthropathies, multiple sites: Secondary | ICD-10-CM | POA: Diagnosis not present

## 2015-11-06 DIAGNOSIS — M47812 Spondylosis without myelopathy or radiculopathy, cervical region: Secondary | ICD-10-CM | POA: Diagnosis not present

## 2015-11-06 DIAGNOSIS — M1990 Unspecified osteoarthritis, unspecified site: Secondary | ICD-10-CM | POA: Diagnosis not present

## 2015-11-06 DIAGNOSIS — M059 Rheumatoid arthritis with rheumatoid factor, unspecified: Secondary | ICD-10-CM | POA: Diagnosis not present

## 2015-11-13 DIAGNOSIS — Z96641 Presence of right artificial hip joint: Secondary | ICD-10-CM | POA: Diagnosis not present

## 2015-11-13 DIAGNOSIS — Z96642 Presence of left artificial hip joint: Secondary | ICD-10-CM | POA: Diagnosis not present

## 2015-11-13 DIAGNOSIS — Z96643 Presence of artificial hip joint, bilateral: Secondary | ICD-10-CM | POA: Diagnosis not present

## 2015-11-13 DIAGNOSIS — Z471 Aftercare following joint replacement surgery: Secondary | ICD-10-CM | POA: Diagnosis not present

## 2015-11-14 DIAGNOSIS — Z96643 Presence of artificial hip joint, bilateral: Secondary | ICD-10-CM | POA: Diagnosis not present

## 2015-12-08 ENCOUNTER — Telehealth: Payer: Self-pay

## 2015-12-08 NOTE — Telephone Encounter (Signed)
I returned call to patient re: symptoms.  She questions if swelling in her leg is lymphedema.  I advised this would be below node removal and therefore probably not.  She also reports 3 doctors have told her they believe she has polymyalgia rheumatica, but the rheumatologist Dr. Michela Pitcher disagrees.  Patient is not comfortable with Dr. Michela Pitcher and is requesting a referral to another rheumatologist.   Message routed to Dr. Jana Hakim.

## 2015-12-11 ENCOUNTER — Other Ambulatory Visit: Payer: Self-pay | Admitting: *Deleted

## 2015-12-11 DIAGNOSIS — M1990 Unspecified osteoarthritis, unspecified site: Secondary | ICD-10-CM

## 2015-12-11 DIAGNOSIS — M353 Polymyalgia rheumatica: Secondary | ICD-10-CM

## 2015-12-24 ENCOUNTER — Telehealth: Payer: Self-pay | Admitting: Family Medicine

## 2015-12-24 NOTE — Telephone Encounter (Signed)
[facility] - Client [place]  Patient Name: Rachael [name]  DOB: 07/31/1933    Initial Comment Caller says, lost her voice on Sat, she is still hoarse, still has a productive cough today.    Nurse Assessment  Nurse: Wayne Sever, RN, Tillie Rung Date/Time (Eastern Time): 12/24/2015 12:11:52 PM  Confirm and document reason for call. If symptomatic, describe symptoms. ---Caller states she does not feel bad. She states she woke up Saturday with no voice and it's coming back now. She coughed up a little phlegm this morning. She states her daughter insisted she call due to hoarse voice. Denies any fever.  Does the patient have any new or worsening symptoms? ---Yes  Will a triage be completed? ---Yes  Related visit to physician within the last 2 weeks? ---No  Does the PT have any chronic conditions? (i.e. diabetes, asthma, etc.) ---Yes  List chronic conditions. ---Gout  Is this a behavioral health or substance abuse call? ---No     Guidelines    Guideline Title Affirmed Question Affirmed Notes  Hoarseness Mild hoarseness (all triage questions negative)    Final Disposition User   [place], RN, Tillie Rung    Referrals  REFERRED TO PCP OFFICE   Disagree/Comply: Comply

## 2015-12-24 NOTE — Telephone Encounter (Signed)
Spoke with pt and she c/o some mild continued hoarseness. She denies any cold/sinus symptoms or pnd. Pt states that she feels well, just can't talk much. She has been drinking hot water with lemon and honey. Asked pt to let us know if she does not continue to improve. Nothing further needed at this time.

## 2015-12-24 NOTE — Telephone Encounter (Signed)
LMTCB

## 2016-01-21 DIAGNOSIS — E018 Other iodine-deficiency related thyroid disorders and allied conditions: Secondary | ICD-10-CM | POA: Diagnosis not present

## 2016-01-21 DIAGNOSIS — R5382 Chronic fatigue, unspecified: Secondary | ICD-10-CM | POA: Diagnosis not present

## 2016-01-21 DIAGNOSIS — E049 Nontoxic goiter, unspecified: Secondary | ICD-10-CM | POA: Diagnosis not present

## 2016-01-21 DIAGNOSIS — E559 Vitamin D deficiency, unspecified: Secondary | ICD-10-CM | POA: Diagnosis not present

## 2016-01-21 DIAGNOSIS — R5383 Other fatigue: Secondary | ICD-10-CM | POA: Diagnosis not present

## 2016-01-22 DIAGNOSIS — M1990 Unspecified osteoarthritis, unspecified site: Secondary | ICD-10-CM | POA: Diagnosis not present

## 2016-01-22 DIAGNOSIS — M059 Rheumatoid arthritis with rheumatoid factor, unspecified: Secondary | ICD-10-CM | POA: Diagnosis not present

## 2016-01-22 DIAGNOSIS — M1189 Other specified crystal arthropathies, multiple sites: Secondary | ICD-10-CM | POA: Diagnosis not present

## 2016-01-22 DIAGNOSIS — M6281 Muscle weakness (generalized): Secondary | ICD-10-CM | POA: Diagnosis not present

## 2016-01-22 DIAGNOSIS — M064 Inflammatory polyarthropathy: Secondary | ICD-10-CM | POA: Diagnosis not present

## 2016-01-23 DIAGNOSIS — E559 Vitamin D deficiency, unspecified: Secondary | ICD-10-CM | POA: Diagnosis not present

## 2016-01-23 DIAGNOSIS — E063 Autoimmune thyroiditis: Secondary | ICD-10-CM | POA: Diagnosis not present

## 2016-01-23 DIAGNOSIS — G729 Myopathy, unspecified: Secondary | ICD-10-CM | POA: Diagnosis not present

## 2016-01-23 DIAGNOSIS — C50819 Malignant neoplasm of overlapping sites of unspecified female breast: Secondary | ICD-10-CM | POA: Diagnosis not present

## 2016-01-23 DIAGNOSIS — E039 Hypothyroidism, unspecified: Secondary | ICD-10-CM | POA: Diagnosis not present

## 2016-01-23 DIAGNOSIS — R7309 Other abnormal glucose: Secondary | ICD-10-CM | POA: Diagnosis not present

## 2016-02-06 ENCOUNTER — Ambulatory Visit: Payer: PPO | Attending: Rheumatology | Admitting: Physical Therapy

## 2016-02-06 ENCOUNTER — Encounter: Payer: Self-pay | Admitting: Physical Therapy

## 2016-02-06 DIAGNOSIS — R2689 Other abnormalities of gait and mobility: Secondary | ICD-10-CM | POA: Diagnosis not present

## 2016-02-06 DIAGNOSIS — R2681 Unsteadiness on feet: Secondary | ICD-10-CM | POA: Insufficient documentation

## 2016-02-06 DIAGNOSIS — M6281 Muscle weakness (generalized): Secondary | ICD-10-CM | POA: Diagnosis not present

## 2016-02-06 DIAGNOSIS — R262 Difficulty in walking, not elsewhere classified: Secondary | ICD-10-CM | POA: Diagnosis not present

## 2016-02-06 NOTE — Therapy (Signed)
[facility] [address] [place], [place], [address] Phone: [phone]   Fax:  [phone]  Physical Therapy Evaluation  Patient Details  Name: Rachael [name] MRN: [id number] Date of Birth: 09-Jul-1933 Referring Provider: Lahoma Rocker, MD  Encounter Date: 02/06/2016      PT End of Session - 02/06/16 1640    Visit Number 1   Number of Visits 16   Date for PT Re-Evaluation 04/06/16   Authorization Type [employer] G Code every 10th visit   PT Start Time 1010   PT Stop Time 1100   PT Time Calculation (min) 50 min   Activity Tolerance Patient tolerated treatment well   Behavior During Therapy [facility] for tasks assessed/performed      Past Medical History:  Diagnosis Date  . Breast cancer (HCC)    R  . CAROTID STENOSIS   . Chronic venous insufficiency   . CONTACT DERMATITIS   . DUPUYTREN'S CONTRACTURE   . Edema    pt  states had edema of feet and legs has been on lasix per dr. to help  . GLUCOSE INTOLERANCE   . HLD (hyperlipidemia)   . Hypothyroidism    Dr. Ala Dach in [place]   . MENOPAUSAL SYNDROME   . Obesity   . Osteoarthritis    bilat hips, severe  . PVD (peripheral vascular disease) ([place])    mild carotid 11/05  . VITAMIN D DEFICIENCY     Past Surgical History:  Procedure Laterality Date  . 2D echo  11/29/03   normal LV size. normal EF   . [place]   lumbarectomy  . BREAST BIOPSY  05/11/11   right breast  . BREAST LUMPECTOMY     right breast  . CHOLECYSTECTOMY    . FINGER SURGERY    . HERNIA REPAIR     incisional hernia  . INGUINAL HERNIA REPAIR    . JOINT REPLACEMENT Bilateral    Dr. Maureen Ralphs  . normal caronary arteries     per pt. 2003  . right rotator cuff surgery    . TONSILLECTOMY    . TOTAL HIP ARTHROPLASTY  05/2008 and 10/2008   B hip - alusio    There were no vitals filed for this visit.       Subjective Assessment - 02/06/16 1019    Subjective Pt is a 83  y.o. female presenting to OPPT referred for LE weakness.  Weakness in LE and UE started August 2016, unable to ambulate-admitted to SNF x 2 weeks.  D/C with 4 wheeled walker and has been using since then.  Has had multiple appointments with physicians but cause is still unknown.  Continues to have episodes of LE weakness and inability to stand or ambulate.   Pertinent History 2013 breast CA with lumpectomy, h/o bilat THA   Limitations Walking;House hold activities   Patient Stated Goals Walk normally again and not use AD   Currently in Pain? No/denies            [facility] PT Assessment - 02/06/16 1002      Assessment   Medical Diagnosis LE weakness   Referring Provider Lahoma Rocker, MD   Onset Date/Surgical Date 02/03/16   Hand Dominance Right   Next MD Visit 6 weeks   Prior Therapy Last year-[facility]patient [facility] for Gait training and LE strengthening.  Only had 2 visits and has been trying to get back into therapy for 1 year.  Therapy at  [facility], SNF     Precautions   Precautions Other (comment);Fall   Precaution Comments h/o breast cancer with lumpectomy     Restrictions   Weight Bearing Restrictions No     Balance Screen   Has the patient fallen in the past 6 months No   Has the patient had a decrease in activity level because of a fear of falling?  Yes   Is the patient reluctant to leave their home because of a fear of falling?  No     Home Environment   Living Environment Private residence   Living Arrangements Alone   Available Help at Discharge Family;Available PRN/intermittently   Type of Home House   Home Access Stairs to enter   Entrance Stairs-Number of Steps 5   Entrance Stairs-Rails Right   Home Layout One level   [place] - 4 wheels;Kasandra Knudsen - single point     Prior Function   Level of Independence Independent with household mobility with device;Independent with community mobility with device;Independent with homemaking  with ambulation;Independent with gait;Independent with transfers;Requires assistive device for independence   Vocation Part time employment   Vocation Requirements [employer]     Cognition   Overall Cognitive Status Within Functional Limits for tasks assessed     Observation/Other Assessments   Focus on Therapeutic Outcomes (FOTO)  29 (71% limited; predicted 49% limited)     Sensation   Light Touch Appears Intact     ROM / Strength   AROM / PROM / Strength Strength     Strength   Overall Strength Deficits   Overall Strength Comments R hip: 4+/5, L Hip Flexion and knee flexion: 3+/5     Transfers   Five time sit to stand comments  --     Ambulation/Gait   Ambulation/Gait Yes   Ambulation/Gait Assistance 4: Min assist  without RW   Ambulation Distance (Feet) 25 Feet   Assistive device None   Gait Pattern Step-through pattern;Decreased step length - right;Decreased step length - left;Decreased stride length;Decreased hip/knee flexion - left;Decreased trunk rotation;Wide base of support   Ambulation Surface Level;Indoor   Stairs Yes   Stairs Assistance 4: Min assist   Stair Management Technique One rail Right;Forwards;Alternating pattern   Number of Stairs 4   Height of Stairs 6     Standardized Balance Assessment   Standardized Balance Assessment Five Times Sit to Stand;Timed Up and Go Test   Five times sit to stand comments  10.50 with UE, 18.72 without UE     Timed Up and Go Test   TUG Normal TUG   Normal TUG (seconds) 17.5  without RW            PT Education - 02/06/16 1638    Education provided Yes   Education Details Impairments, POC, goals and focus of treatment interventions.  Educated on [place].   Person(s) Educated Patient   Methods Explanation;Demonstration;Handout   Comprehension Verbalized understanding;Returned demonstration          PT Short Term Goals - 02/06/16 1649      PT SHORT TERM GOAL #1   Title Will improve five time sit to stand time  to 12 seconds without use of UE for improved functional LE strength (TARGET DATE FOR ALL STG 03/05/16)   Baseline 18.72   Status New     PT SHORT TERM GOAL #2   Title Will decrease falls risk as indicated by decrease in TUG score <15 seconds without RW  Baseline 17.5   Status New     PT SHORT TERM GOAL #3   Title Will ambulate up/down 5 stairs with one rail with alternating method and Mod I   Baseline Min A with one rail   Status New     PT SHORT TERM GOAL #4   Title Pt will perform gait on level, indoor surfaces without RW x 150' with supervision   Baseline min-mod A without RW   Status New     PT SHORT TERM GOAL #5   Title Will perform BERG to assess balance deficits and LTG to be set   Baseline No score   Status New           PT Long Term Goals - 11-Feb-2016 1656      PT LONG TERM GOAL #1   Title Will improve hip and knee flexion strength to 5/5 bilaterally (TARGET DATE FOR ALL LTG 04/06/16)   Status New     PT LONG TERM GOAL #2   Title Will improve functional LE strength as indicated by five times sit to stand time of <12 seconds without RW   Status New     PT LONG TERM GOAL #3   Title Will decrease falls risk as indicated by a TUG score of <13 seconds without RW    Status New     PT LONG TERM GOAL #4   Title Will perform HEP independently   Status New     PT LONG TERM GOAL #5   Title Will perform ambulation over level and unlevel surfaces indoor/outdoor without AD and mod I x 500'   Status New               Plan - 11-Feb-2016 1642    Clinical Impression Statement Pt presents to OPPT for moderately complex evaluation due to h/o significant LE weakness and decline in functional mobility and independence.  Pt presents with bilat LE edema, impaired strength, endurance, impaired balance, gait and increased falls risk.  Pt would benefit from skilled PT services to address listed impairments and to reduce falls risk and maximize functional mobility independence.    Rehab Potential Good   Clinical Impairments Affecting Rehab Potential 2 year history of LE weakness and steroid use   PT Frequency 2x / week   PT Duration 8 weeks   PT Treatment/Interventions ADLs/Self Care Home Management;Gait training;Stair training;Functional mobility training;DME Instruction;Therapeutic activities;Therapeutic exercise;Balance training;Neuromuscular re-education;Patient/family education   PT Next Visit Plan Assessment of BERG for falls risk; review HEP   Consulted and Agree with Plan of Care Patient      Patient will benefit from skilled therapeutic intervention in order to improve the following deficits and impairments:  Abnormal gait, Decreased balance, Decreased strength, Difficulty walking, Decreased endurance  Visit Diagnosis: Muscle weakness (generalized)  Difficulty in walking, not elsewhere classified  Other abnormalities of gait and mobility  Unsteadiness on feet      G-Codes - 02/11/2016 1702    Functional Assessment Tool Used TUG, Five Times Sit to Stand   Functional Limitation Mobility: Walking and moving around   Mobility: Walking and Moving Around Current Status [phone]) At least 40 percent but less than 60 percent impaired, limited or restricted   Mobility: Walking and Moving Around Goal Status [phone]) At least 1 percent but less than 20 percent impaired, limited or restricted       Problem List Patient Active Problem List   Diagnosis Date Noted  . SIRS (systemic inflammatory response  syndrome) ([place])   . Weakness of both lower extremities   . Arthritis   . Sepsis secondary to UTI ([place]) 03/18/2015  . Gout attack 10/23/2014  . Toe effusion 05/24/2014  . Anemia in neoplastic disease 04/02/2014  . CKD (chronic kidney disease), stage III 04/02/2014  . Wound infection 03/18/2014  . Cellulitis 03/18/2014  . Acute renal failure ([place]) 03/18/2014  . Leg edema, right   . Open wound of leg 02/22/2014  . Breast cancer, right breast ([place])  05/13/2011  . Incisional hernia, incarcerated, right upper quadrant 07/13/2010  . HEADACHE 11/06/2009  . CONTACT DERMATITIS 06/13/2009  . FATIGUE 06/13/2009  . EDEMA 05/01/2008  . BACK PAIN 03/29/2008  . CAROTID STENOSIS 03/16/2008  . ANXIETY 11/14/2007  . MENOPAUSAL SYNDROME 09/27/2007  . GLUCOSE INTOLERANCE 08/10/2007  . HYPERLIPIDEMIA 08/10/2007  . PERIPHERAL VASCULAR DISEASE 08/10/2007  . Venous (peripheral) insufficiency 08/10/2007  . Vitamin D deficiency 12/26/2006  . OBESITY 12/26/2006  . DUPUYTREN'S CONTRACTURE 12/26/2006  . Hypothyroidism 11/05/2006  . OSTEOARTHRITIS 11/05/2006    Raylene Everts, PT, DPT 02/06/16    5:08 PM    [facility] [address] [place], [place], [address] Phone: [phone]   Fax:  [phone]  Name: Rachael [name] MRN: [id number] Date of Birth: July 16, 1933

## 2016-02-06 NOTE — Patient Instructions (Signed)
Standing Marching   Using a chair if necessary, march in place, pause at top. Repeat 12 times. Do 2 sessions per day.   Standing On One Leg Without Support .  Stand on one leg in neutral spine with one hand for support. Hold 12 seconds. Repeat on other leg. Do 2 repetitions, 2 sets per day.  http://bt.exer.us/36   Copyright  VHI. All rights reserved.    Side-Stepping   Walk to left side with eyes open. Take even steps, leading with same foot. Make sure each foot lifts off the floor. Repeat in opposite direction. Repeat all the way around your island, change directions. Do 2 sessions per day.   Hamstring Curl    Hold on to countertop for balance. Shift weight onto right leg, while bending left knee. Repeat, switching legs. Repeat _12_ times, alternating legs. Do _2_ times per day.  Copyright  VHI. All rights reserved.

## 2016-02-10 ENCOUNTER — Ambulatory Visit: Payer: PPO | Admitting: Physical Therapy

## 2016-02-10 ENCOUNTER — Encounter: Payer: Self-pay | Admitting: Physical Therapy

## 2016-02-10 DIAGNOSIS — R2681 Unsteadiness on feet: Secondary | ICD-10-CM

## 2016-02-10 DIAGNOSIS — R262 Difficulty in walking, not elsewhere classified: Secondary | ICD-10-CM

## 2016-02-10 DIAGNOSIS — M6281 Muscle weakness (generalized): Secondary | ICD-10-CM

## 2016-02-10 DIAGNOSIS — R2689 Other abnormalities of gait and mobility: Secondary | ICD-10-CM

## 2016-02-10 NOTE — Patient Instructions (Signed)
Side Tilt (Sitting)    Sit on bench in bedroom. Tighten pelvic floor and hold. Roll hips slightly to one side by tilting pelvis up on opposite side, lengthen ribs. Repeat to other side. Relax. Repeat _10__ times. Do _2__ times a day. Advanced: Continue to hold squeeze through repetitions.  Copyright  VHI. All rights reserved.

## 2016-02-10 NOTE — Therapy (Signed)
[facility] [address] [place], [place], [address] Phone: [phone]   Fax:  [phone]  Physical Therapy Treatment  Patient Details  Name: Rachael [name] MRN: [id number] Date of Birth: 04/13/1933 Referring Provider: Lahoma Rocker, MD  Encounter Date: 02/10/2016      PT End of Session - 02/10/16 1538    Visit Number 2   Number of Visits 16   Date for PT Re-Evaluation 04/06/16   Authorization Type [employer] G Code every 10th visit   PT Start Time 1320   PT Stop Time 1406   PT Time Calculation (min) 46 min   Activity Tolerance Patient tolerated treatment well;Patient limited by pain   Behavior During Therapy [facility] for tasks assessed/performed      Past Medical History:  Diagnosis Date  . Breast cancer (HCC)    R  . CAROTID STENOSIS   . Chronic venous insufficiency   . CONTACT DERMATITIS   . DUPUYTREN'S CONTRACTURE   . Edema    pt  states had edema of feet and legs has been on lasix per dr. to help  . GLUCOSE INTOLERANCE   . HLD (hyperlipidemia)   . Hypothyroidism    Dr. Ala Dach in [place]   . MENOPAUSAL SYNDROME   . Obesity   . Osteoarthritis    bilat hips, severe  . PVD (peripheral vascular disease) ([place])    mild carotid 11/05  . VITAMIN D DEFICIENCY     Past Surgical History:  Procedure Laterality Date  . 2D echo  11/29/03   normal LV size. normal EF   . [place]   lumbarectomy  . BREAST BIOPSY  05/11/11   right breast  . BREAST LUMPECTOMY     right breast  . CHOLECYSTECTOMY    . FINGER SURGERY    . HERNIA REPAIR     incisional hernia  . INGUINAL HERNIA REPAIR    . JOINT REPLACEMENT Bilateral    Dr. Maureen Ralphs  . normal caronary arteries     per pt. 2003  . right rotator cuff surgery    . TONSILLECTOMY    . TOTAL HIP ARTHROPLASTY  05/2008 and 10/2008   B hip - alusio    There were no vitals filed for this visit.      Subjective Assessment - 02/10/16 1322    Subjective Pt reports performing HEP and reporting discomfort in R knee with marching.  Still having difficulty transitioning sit > stand especially out of the car.   Pertinent History 2013 breast CA with lumpectomy, h/o bilat THA   Limitations Walking;House hold activities   Patient Stated Goals Walk normally again and not use AD   Currently in Pain? No/denies  legs feel achy            [facility] PT Assessment - 02/10/16 1324      Standardized Balance Assessment   Standardized Balance Assessment Berg Balance Test     Berg Balance Test   Sit to Stand Able to stand  independently using hands   Standing Unsupported Able to stand 2 minutes with supervision   Sitting with Back Unsupported but Feet Supported on Floor or Stool Able to sit safely and securely 2 minutes   Stand to Sit Sits independently, has uncontrolled descent   Transfers Able to transfer with verbal cueing and /or supervision   Standing Unsupported with Eyes Closed Able to stand 10 seconds with supervision   Standing Ubsupported with Feet Together  Able to place feet together independently and stand for 1 minute with supervision   From Standing, Reach Forward with Outstretched Arm Can reach confidently >25 cm (10")   From Standing Position, Pick up Object from [place] to pick up shoe safely and easily   From Standing Position, Turn to Look Behind Over each Shoulder Looks behind one side only/other side shows less weight shift   Turn 360 Degrees Able to turn 360 degrees safely but slowly   Standing Unsupported, Alternately Place Feet on Step/Stool Able to complete 4 steps without aid or supervision   Standing Unsupported, One Foot in [place] to plae foot ahead of the other independently and hold 30 seconds   Standing on One Leg Able to lift leg independently and hold 5-10 seconds   Total Score 40      Patient demonstrates increased fall risk as noted by score of  40/56 on Berg Balance Scale.  (<36= high risk for falls,  close to 100%; 37-45 significant >80%; 46-51 moderate >50%; 52-55 lower >25%)       [facility] Adult PT Treatment/Exercise - 02/10/16 1531      Exercises   Exercises Lumbar;Knee/Hip     Lumbar Exercises: Stretches   Standing Side Bend 1 rep;10 seconds  to each side; pain in R hip   Piriformis Stretch Limitations   Piriformis Stretch Limitations Due to pain/tightness at piriformis mm attempted seated piriformis stretch but pt currently does not have the ROM and strength to perform     Lumbar Exercises: Seated   Other Seated Lumbar Exercises Lateral pelvic elevation/depression with trunk shortening/lengthening to L and R x 10 reps to focus on core activation and improve hip ROM     Knee/Hip Exercises: Standing   Knee Flexion Strengthening;Both;1 set;10 reps   Hip Flexion Stengthening;Both;1 set;10 reps          Balance Exercises - 02/10/16 1534      Balance Exercises: Standing   SLS Eyes open;Solid surface;5 reps;10 secs  R and LLE with bilat UE support   Sidestepping 4 reps  one UE support with focus on maintaining upright trunk            PT Education - 02/10/16 1537    Education provided Yes   Education Details Falls risk and balance impairments, postural impairments, review HEP, added lateral trunk and hip dynamic stretches to HEP   Person(s) Educated Patient   Methods Explanation;Demonstration;Handout   Comprehension Verbalized understanding;Returned demonstration          PT Short Term Goals - 02/10/16 1542      PT SHORT TERM GOAL #1   Title Will improve five time sit to stand time to 12 seconds without use of UE for improved functional LE strength (TARGET DATE FOR ALL STG 03/05/16)   Baseline 18.72   Status New     PT SHORT TERM GOAL #2   Title Will decrease falls risk as indicated by decrease in TUG score <15 seconds without RW    Baseline 17.5   Status New     PT SHORT TERM GOAL #3   Title Will ambulate up/down 5 stairs with one rail with alternating  method and Mod I   Baseline Min A with one rail   Status New     PT SHORT TERM GOAL #4   Title Pt will perform gait on level, indoor surfaces without RW x 150' with supervision   Baseline min-mod A without RW   Status  New     PT SHORT TERM GOAL #5   Title Will perform BERG to assess balance deficits and LTG to be set   Baseline Goal met 02/10/16; LTG written   Status Achieved           PT Long Term Goals - 02/10/16 1543      PT LONG TERM GOAL #1   Title Will improve hip and knee flexion strength to 5/5 bilaterally (TARGET DATE FOR ALL LTG 04/06/16)   Status New     PT LONG TERM GOAL #2   Title Will improve functional LE strength as indicated by five times sit to stand time of <12 seconds without RW   Status New     PT LONG TERM GOAL #3   Title Will decrease falls risk as indicated by a TUG score of <13 seconds without RW    Status New     PT LONG TERM GOAL #4   Title Will perform HEP independently   Status New     PT LONG TERM GOAL #5   Title Will perform ambulation over level and unlevel surfaces indoor/outdoor without AD and mod I x 500'   Status New     Additional Long Term Goals   Additional Long Term Goals Yes     PT LONG TERM GOAL #6   Title will decrease falls risk and will improve BERG balance score to =/> 45/56 overall   Baseline 40/56   Status New           Plan - 02/10/16 1539    Clinical Impression Statement Focus of session today on BERG balance assessment for falls risk assessment, review of HEP and continued education on dysfunctional movement patterns for pain avoidance and areas to continue to focus on in strengthening, ROM, posture, balance and gait.  Will continue to address these impairments.   Clinical Impairments Affecting Rehab Potential 2 year history of LE weakness and steroid use   PT Treatment/Interventions ADLs/Self Care Home Management;Gait training;Stair training;Functional mobility training;DME Instruction;Therapeutic  activities;Therapeutic exercise;Balance training;Neuromuscular re-education;Patient/family education   PT Next Visit Plan Stretching of RLE (piriformis, hip flexors), balance without UE support, LE strengthening   Consulted and Agree with Plan of Care Patient      Patient will benefit from skilled therapeutic intervention in order to improve the following deficits and impairments:  Abnormal gait, Decreased balance, Decreased strength, Difficulty walking, Decreased endurance  Visit Diagnosis: Muscle weakness (generalized)  Difficulty in walking, not elsewhere classified  Other abnormalities of gait and mobility  Unsteadiness on feet     Problem List Patient Active Problem List   Diagnosis Date Noted  . SIRS (systemic inflammatory response syndrome) (HCC)   . Weakness of both lower extremities   . Arthritis   . Sepsis secondary to UTI ([place]) 03/18/2015  . Gout attack 10/23/2014  . Toe effusion 05/24/2014  . Anemia in neoplastic disease 04/02/2014  . CKD (chronic kidney disease), stage III 04/02/2014  . Wound infection 03/18/2014  . Cellulitis 03/18/2014  . Acute renal failure ([place]) 03/18/2014  . Leg edema, right   . Open wound of leg 02/22/2014  . Breast cancer, right breast ([place]) 05/13/2011  . Incisional hernia, incarcerated, right upper quadrant 07/13/2010  . HEADACHE 11/06/2009  . CONTACT DERMATITIS 06/13/2009  . FATIGUE 06/13/2009  . EDEMA 05/01/2008  . BACK PAIN 03/29/2008  . CAROTID STENOSIS 03/16/2008  . ANXIETY 11/14/2007  . MENOPAUSAL SYNDROME 09/27/2007  . GLUCOSE INTOLERANCE 08/10/2007  . HYPERLIPIDEMIA  08/10/2007  . PERIPHERAL VASCULAR DISEASE 08/10/2007  . Venous (peripheral) insufficiency 08/10/2007  . Vitamin D deficiency 12/26/2006  . OBESITY 12/26/2006  . DUPUYTREN'S CONTRACTURE 12/26/2006  . Hypothyroidism 11/05/2006  . OSTEOARTHRITIS 11/05/2006   Raylene Everts, PT, DPT 02/10/16    3:50 PM    [facility] [address] [place], [place], [address] Phone: [phone]   Fax:  [phone]  Name: Rachael [name] MRN: [id number] Date of Birth: 08/19/1933

## 2016-02-11 ENCOUNTER — Encounter: Payer: Self-pay | Admitting: Physical Therapy

## 2016-02-11 ENCOUNTER — Ambulatory Visit: Payer: PPO | Admitting: Physical Therapy

## 2016-02-11 DIAGNOSIS — R2689 Other abnormalities of gait and mobility: Secondary | ICD-10-CM

## 2016-02-11 DIAGNOSIS — M6281 Muscle weakness (generalized): Secondary | ICD-10-CM | POA: Diagnosis not present

## 2016-02-11 DIAGNOSIS — R2681 Unsteadiness on feet: Secondary | ICD-10-CM

## 2016-02-11 DIAGNOSIS — R262 Difficulty in walking, not elsewhere classified: Secondary | ICD-10-CM

## 2016-02-11 NOTE — Patient Instructions (Signed)
**  Stretch front of hip-lie at edge of bed-drop right leg off side slowly and keep left knee bent (to protect back).  Hold x 1 minute.  Scoot to other side of bed and repeat with left leg.  Bridge    Lying on back, legs bent 90, feet flat on floor. Press up hips and torso, reaching hands to feet. Hold for ____ breaths. ADVANCED: Clasp hands underneath back and squeeze shoulder blades together, lifting upper body onto outside of shoulders.  Copyright  VHI. All rights reserved.  Abductor Strength: Bridge Pose (Strap)    Make strap wide enough to brace knees at hip width. Press into strap with knees. Hold for ____ breaths. Repeat ____ times.  Copyright  VHI. All rights reserved.     **Lying down with band around thighs: step right leg out to side (keep knee in line with foot) and back in x 10 reps, repeat with left leg x 10 reps.

## 2016-02-11 NOTE — Therapy (Signed)
[facility] [address] [place], [place], [address] Phone: [phone]   Fax:  [phone]  Physical Therapy Treatment  Patient Details  Name: Rachael [name] MRN: [id number] Date of Birth: 12-Dec-1933 Referring Provider: Lahoma Rocker, MD  Encounter Date: 02/11/2016      PT End of Session - 02/11/16 1246    Visit Number 3   Number of Visits 16   Date for PT Re-Evaluation 04/06/16   Authorization Type [employer] G Code every 10th visit   PT Start Time 1149   PT Stop Time 1239   PT Time Calculation (min) 50 min   Activity Tolerance Patient tolerated treatment well   Behavior During Therapy [facility] for tasks assessed/performed      Past Medical History:  Diagnosis Date  . Breast cancer (HCC)    R  . CAROTID STENOSIS   . Chronic venous insufficiency   . CONTACT DERMATITIS   . DUPUYTREN'S CONTRACTURE   . Edema    pt  states had edema of feet and legs has been on lasix per dr. to help  . GLUCOSE INTOLERANCE   . HLD (hyperlipidemia)   . Hypothyroidism    Dr. Ala Dach in [place]   . MENOPAUSAL SYNDROME   . Obesity   . Osteoarthritis    bilat hips, severe  . PVD (peripheral vascular disease) ([place])    mild carotid 11/05  . VITAMIN D DEFICIENCY     Past Surgical History:  Procedure Laterality Date  . 2D echo  11/29/03   normal LV size. normal EF   . [place]   lumbarectomy  . BREAST BIOPSY  05/11/11   right breast  . BREAST LUMPECTOMY     right breast  . CHOLECYSTECTOMY    . FINGER SURGERY    . HERNIA REPAIR     incisional hernia  . INGUINAL HERNIA REPAIR    . JOINT REPLACEMENT Bilateral    Dr. Maureen Ralphs  . normal caronary arteries     per pt. 2003  . right rotator cuff surgery    . TONSILLECTOMY    . TOTAL HIP ARTHROPLASTY  05/2008 and 10/2008   B hip - alusio    There were no vitals filed for this visit.      Subjective Assessment - 02/11/16 1153    Subjective Reporting some  soreness in R hip after exercises yesterday.  Did not take anything for soreness.  Did try HEP this am but continues to report pain in R hip with marching exercise.   Pertinent History 2013 breast CA with lumpectomy, h/o bilat THA   Limitations Walking;House hold activities   Patient Stated Goals Walk normally again and not use AD   Currently in Pain? No/denies  general mm soreness from exercises           [facility] Adult PT Treatment/Exercise - 02/11/16 1212      Knee/Hip Exercises: Stretches   Hip Flexor Stretch Right;1 rep;60 seconds   Hip Flexor Stretch Limitations Supine with RLE off side of mat to include ADD stretches   Other Knee/Hip Stretches Supine hip stretch into ER and progressive hip flexion below 90 deg     Knee/Hip Exercises: Supine   Bridges Limitations x 10 reps bilat   Bridges with Clamshell Strengthening;Both;1 set;10 reps  with green theraband   Other Supine Knee/Hip Exercises Single leg ABD/Walk outs against resistance of green theraband; 10 reps each side  Balance Exercises - 02/11/16 1222      Balance Exercises: Standing   Other Standing Exercises Step out and back with one UE support>> no UE support x 8 reps each side           PT Education - 02/11/16 1246    Education provided Yes   Education Details Addition of stretching and supine hip strengthening exercises to HEP   Person(s) Educated Patient   Methods Explanation;Demonstration;Handout   Comprehension Verbalized understanding;Returned demonstration          PT Short Term Goals - 02/10/16 1542      PT SHORT TERM GOAL #1   Title Will improve five time sit to stand time to 12 seconds without use of UE for improved functional LE strength (TARGET DATE FOR ALL STG 03/05/16)   Baseline 18.72   Status New     PT SHORT TERM GOAL #2   Title Will decrease falls risk as indicated by decrease in TUG score <15 seconds without RW    Baseline 17.5   Status New     PT SHORT TERM GOAL  #3   Title Will ambulate up/down 5 stairs with one rail with alternating method and Mod I   Baseline Min A with one rail   Status New     PT SHORT TERM GOAL #4   Title Pt will perform gait on level, indoor surfaces without RW x 150' with supervision   Baseline min-mod A without RW   Status New     PT SHORT TERM GOAL #5   Title Will perform BERG to assess balance deficits and LTG to be set   Baseline Goal met 02/10/16; LTG written   Status Achieved           PT Long Term Goals - 02/10/16 1543      PT LONG TERM GOAL #1   Title Will improve hip and knee flexion strength to 5/5 bilaterally (TARGET DATE FOR ALL LTG 04/06/16)   Status New     PT LONG TERM GOAL #2   Title Will improve functional LE strength as indicated by five times sit to stand time of <12 seconds without RW   Status New     PT LONG TERM GOAL #3   Title Will decrease falls risk as indicated by a TUG score of <13 seconds without RW    Status New     PT LONG TERM GOAL #4   Title Will perform HEP independently   Status New     PT LONG TERM GOAL #5   Title Will perform ambulation over level and unlevel surfaces indoor/outdoor without AD and mod I x 500'   Status New     Additional Long Term Goals   Additional Long Term Goals Yes     PT LONG TERM GOAL #6   Title will decrease falls risk and will improve BERG balance score to =/> 45/56 overall   Baseline 40/56   Status New               Plan - 02/11/16 1247    Clinical Impression Statement Session with continued focus on bilat hip ROM and strengthening with trunk supported in supine.  Also performed dynamic standing balance training with focus on full lateral weight shifting at pelvis with increased ROM and full step length forwards and back with each LE progressing from one >> no UE support to work towards gait without AD; pt tolerated well.  Will continue  to address.   PT Treatment/Interventions ADLs/Self Care Home Management;Gait training;Stair  training;Functional mobility training;DME Instruction;Therapeutic activities;Therapeutic exercise;Balance training;Neuromuscular re-education;Patient/family education   PT Next Visit Plan Hip ROM and strengthening, dynamic balance and gait working towards no UE support   Consulted and Agree with Plan of Care Patient      Patient will benefit from skilled therapeutic intervention in order to improve the following deficits and impairments:  Abnormal gait, Decreased balance, Decreased strength, Difficulty walking, Decreased endurance  Visit Diagnosis: Muscle weakness (generalized)  Difficulty in walking, not elsewhere classified  Other abnormalities of gait and mobility  Unsteadiness on feet     Problem List Patient Active Problem List   Diagnosis Date Noted  . SIRS (systemic inflammatory response syndrome) (HCC)   . Weakness of both lower extremities   . Arthritis   . Sepsis secondary to UTI ([place]) 03/18/2015  . Gout attack 10/23/2014  . Toe effusion 05/24/2014  . Anemia in neoplastic disease 04/02/2014  . CKD (chronic kidney disease), stage III 04/02/2014  . Wound infection 03/18/2014  . Cellulitis 03/18/2014  . Acute renal failure ([place]) 03/18/2014  . Leg edema, right   . Open wound of leg 02/22/2014  . Breast cancer, right breast ([place]) 05/13/2011  . Incisional hernia, incarcerated, right upper quadrant 07/13/2010  . HEADACHE 11/06/2009  . CONTACT DERMATITIS 06/13/2009  . FATIGUE 06/13/2009  . EDEMA 05/01/2008  . BACK PAIN 03/29/2008  . CAROTID STENOSIS 03/16/2008  . ANXIETY 11/14/2007  . MENOPAUSAL SYNDROME 09/27/2007  . GLUCOSE INTOLERANCE 08/10/2007  . HYPERLIPIDEMIA 08/10/2007  . PERIPHERAL VASCULAR DISEASE 08/10/2007  . Venous (peripheral) insufficiency 08/10/2007  . Vitamin D deficiency 12/26/2006  . OBESITY 12/26/2006  . DUPUYTREN'S CONTRACTURE 12/26/2006  . Hypothyroidism 11/05/2006  . OSTEOARTHRITIS 11/05/2006   Raylene Everts, PT, DPT 02/11/16    12:54  PM   [facility] [address] [place], [place], [address] Phone: [phone]   Fax:  [phone]  Name: MARWAH DISBRO MRN: [id number] Date of Birth: [birth year]

## 2016-02-18 ENCOUNTER — Ambulatory Visit: Payer: PPO | Attending: Rheumatology | Admitting: Physical Therapy

## 2016-02-18 ENCOUNTER — Encounter: Payer: Self-pay | Admitting: Physical Therapy

## 2016-02-18 DIAGNOSIS — R2681 Unsteadiness on feet: Secondary | ICD-10-CM | POA: Insufficient documentation

## 2016-02-18 DIAGNOSIS — R262 Difficulty in walking, not elsewhere classified: Secondary | ICD-10-CM | POA: Diagnosis not present

## 2016-02-18 DIAGNOSIS — R2689 Other abnormalities of gait and mobility: Secondary | ICD-10-CM | POA: Diagnosis not present

## 2016-02-18 DIAGNOSIS — M6281 Muscle weakness (generalized): Secondary | ICD-10-CM | POA: Diagnosis not present

## 2016-02-18 NOTE — Therapy (Signed)
[facility] [address] [place], [place], [address] Phone: [phone]   Fax:  [phone]  Physical Therapy Treatment  Patient Details  Name: Rachael [name] MRN: [id number] Date of Birth: 1933-12-16 Referring Provider: Lahoma Rocker, MD  Encounter Date: 02/18/2016      Rachael [name] End of Session - 02/18/16 1107    Visit Number 4   Number of Visits 16   Date for Rachael [name] Re-Evaluation 04/06/16   Authorization Type [employer] G Code every 10th visit   Rachael [name] Start Time 1021   Rachael [name] Stop Time 1104   Rachael [name] Time Calculation (min) 43 min   Activity Tolerance Patient tolerated treatment well   Behavior During Therapy [facility] for tasks assessed/performed      Past Medical History:  Diagnosis Date  . Breast cancer (HCC)    R  . CAROTID STENOSIS   . Chronic venous insufficiency   . CONTACT DERMATITIS   . DUPUYTREN'S CONTRACTURE   . Edema    Rachael [name]  states had edema of feet and legs has been on lasix per dr. to help  . GLUCOSE INTOLERANCE   . HLD (hyperlipidemia)   . Hypothyroidism    Dr. Ala Dach in [place]   . MENOPAUSAL SYNDROME   . Obesity   . Osteoarthritis    bilat hips, severe  . PVD (peripheral vascular disease) ([place])    mild carotid 11/05  . VITAMIN D DEFICIENCY     Past Surgical History:  Procedure Laterality Date  . 2D echo  11/29/03   normal LV size. normal EF   . [place]   lumbarectomy  . BREAST BIOPSY  05/11/11   right breast  . BREAST LUMPECTOMY     right breast  . CHOLECYSTECTOMY    . FINGER SURGERY    . HERNIA REPAIR     incisional hernia  . INGUINAL HERNIA REPAIR    . JOINT REPLACEMENT Bilateral    Dr. Maureen Ralphs  . normal caronary arteries     per Rachael [name]. 2003  . right rotator cuff surgery    . TONSILLECTOMY    . TOTAL HIP ARTHROPLASTY  05/2008 and 10/2008   B hip - alusio    There were no vitals filed for this visit.      Subjective Assessment - 02/18/16 1025    Subjective Rachael [name] was showing  some improvement in ability to walk around house without RW or cane until yesterday when Rachael [name] was ambulating at grocery store and legs became weak and required assistance out to her car and then had to sit in the car x 15 minutes to gain enough strength to get out of the car and ascend stairs to house; daughter had to come unload groceries later day.  Still having some soreness in RLE with exercises.     Pertinent History 2013 breast CA with lumpectomy, h/o bilat THA   Limitations Walking;House hold activities   Patient Stated Goals Walk normally again and not use AD   Currently in Pain? No/denies           [facility] Adult Rachael [name] Treatment/Exercise - 02/18/16 1050      Lumbar Exercises: Supine   Bent Knee Raise 10 reps   Bent Knee Raise Limitations single leg R and L x 10 reps each, alternating R and L x 10 reps, bilat LE raise x 3 reps with verbal and tactile cues for core activation     Knee/Hip Exercises: Stretches   Hip  Flexor Stretch Right;1 rep;60 seconds     Knee/Hip Exercises: Standing   Hip Flexion Stengthening;Right;Left;10 reps   Hip Flexion Limitations tapping 4" step, single leg and then alternating; one UE support with verbal and tactile cues for core and glute activation   Hip ADduction Strengthening;Right;Left;10 reps  side taps to L and R to 4" step           Rachael [name] Education - 02/18/16 1105    Education provided Yes   Education Details stabilize headboard to stabilize feet during HEP, activation of core during HEP   Person(s) Educated Patient   Methods Explanation   Comprehension Verbalized understanding          Rachael [name] Short Term Goals - 02/10/16 1542      Rachael [name] SHORT TERM GOAL #1   Title Will improve five time sit to stand time to 12 seconds without use of UE for improved functional LE strength (TARGET DATE FOR ALL STG 03/05/16)   Baseline 18.72   Status New     Rachael [name] SHORT TERM GOAL #2   Title Will decrease falls risk as indicated by decrease in TUG score <15 seconds  without RW    Baseline 17.5   Status New     Rachael [name] SHORT TERM GOAL #3   Title Will ambulate up/down 5 stairs with one rail with alternating method and Mod I   Baseline Min A with one rail   Status New     Rachael [name] SHORT TERM GOAL #4   Title Rachael [name] will perform gait on level, indoor surfaces without RW x 150' with supervision   Baseline min-mod A without RW   Status New     Rachael [name] SHORT TERM GOAL #5   Title Will perform BERG to assess balance deficits and LTG to be set   Baseline Goal met 02/10/16; LTG written   Status Achieved           Rachael [name] Long Term Goals - 02/10/16 1543      Rachael [name] LONG TERM GOAL #1   Title Will improve hip and knee flexion strength to 5/5 bilaterally (TARGET DATE FOR ALL LTG 04/06/16)   Status New     Rachael [name] LONG TERM GOAL #2   Title Will improve functional LE strength as indicated by five times sit to stand time of <12 seconds without RW   Status New     Rachael [name] LONG TERM GOAL #3   Title Will decrease falls risk as indicated by a TUG score of <13 seconds without RW    Status New     Rachael [name] LONG TERM GOAL #4   Title Will perform HEP independently   Status New     Rachael [name] LONG TERM GOAL #5   Title Will perform ambulation over level and unlevel surfaces indoor/outdoor without AD and mod I x 500'   Status New     Additional Long Term Goals   Additional Long Term Goals Yes     Rachael [name] LONG TERM GOAL #6   Title will decrease falls risk and will improve BERG balance score to =/> 45/56 overall   Baseline 40/56   Status New               Plan - 02/18/16 1108    Clinical Impression Statement Rachael [name] reporting feet slipping on bed while trying to perform HEP; discussed and problem solved various positions on bed to stabilize feet; advised Rachael [name] to turn around and place feet against headboard and use of pillows  to stabilize headboard against wall.  Rest of session focused on continued ROM and strengthening of proximal hip muscles with focus on core activation for proximal stability during distal  movement.  Initiated core activation training in supine and progressed to activation during hip flexion/taps in standing with Rachael [name] demonstrating improved R hip flexion AROM with core activation.  Also performed tapping in standing to focus on single leg stance balance for improved stability during gait without UE support.  Rachael [name] demonstrating improved upright posture in midline today and improved ability to maintain posture during LE exercises in standing-decreased lateral leaning.  Rachael [name] reports her daughter has noted an improvement in posture and gait.  Will continue to address.   Rehab Potential Good   Clinical Impairments Affecting Rehab Potential 2 year history of LE weakness and steroid use   Rachael [name] Treatment/Interventions ADLs/Self Care Home Management;Gait training;Stair training;Functional mobility training;DME Instruction;Therapeutic activities;Therapeutic exercise;Balance training;Neuromuscular re-education;Patient/family education   Rachael [name] Next Visit Plan Hip ROM and strengthening, dynamic balance and gait working towards no UE support, stair negotiation   Consulted and Agree with Plan of Care Patient      Patient will benefit from skilled therapeutic intervention in order to improve the following deficits and impairments:  Abnormal gait, Decreased balance, Decreased strength, Difficulty walking, Decreased endurance  Visit Diagnosis: Muscle weakness (generalized)  Difficulty in walking, not elsewhere classified  Other abnormalities of gait and mobility  Unsteadiness on feet     Problem List Patient Active Problem List   Diagnosis Date Noted  . SIRS (systemic inflammatory response syndrome) (HCC)   . Weakness of both lower extremities   . Arthritis   . Sepsis secondary to UTI ([place]) 03/18/2015  . Gout attack 10/23/2014  . Toe effusion 05/24/2014  . Anemia in neoplastic disease 04/02/2014  . CKD (chronic kidney disease), stage III 04/02/2014  . Wound infection 03/18/2014  . Cellulitis  03/18/2014  . Acute renal failure ([place]) 03/18/2014  . Leg edema, right   . Open wound of leg 02/22/2014  . Breast cancer, right breast ([place]) 05/13/2011  . Incisional hernia, incarcerated, right upper quadrant 07/13/2010  . HEADACHE 11/06/2009  . CONTACT DERMATITIS 06/13/2009  . FATIGUE 06/13/2009  . EDEMA 05/01/2008  . BACK PAIN 03/29/2008  . CAROTID STENOSIS 03/16/2008  . ANXIETY 11/14/2007  . MENOPAUSAL SYNDROME 09/27/2007  . GLUCOSE INTOLERANCE 08/10/2007  . HYPERLIPIDEMIA 08/10/2007  . PERIPHERAL VASCULAR DISEASE 08/10/2007  . Venous (peripheral) insufficiency 08/10/2007  . Vitamin D deficiency 12/26/2006  . OBESITY 12/26/2006  . DUPUYTREN'S CONTRACTURE 12/26/2006  . Hypothyroidism 11/05/2006  . OSTEOARTHRITIS 11/05/2006    Rachael [name], Rachael [name], Rachael [name] 02/18/16    11:15 AM   [facility] [address] [place], [place], [address] Phone: [phone]   Fax:  [phone]  Name: Rachael [name] MRN: [id number] Date of Birth: June 17, 1933

## 2016-02-20 ENCOUNTER — Encounter: Payer: Self-pay | Admitting: Physical Therapy

## 2016-02-20 ENCOUNTER — Ambulatory Visit: Payer: PPO | Admitting: Physical Therapy

## 2016-02-20 DIAGNOSIS — R2681 Unsteadiness on feet: Secondary | ICD-10-CM

## 2016-02-20 DIAGNOSIS — R2689 Other abnormalities of gait and mobility: Secondary | ICD-10-CM

## 2016-02-20 DIAGNOSIS — M6281 Muscle weakness (generalized): Secondary | ICD-10-CM | POA: Diagnosis not present

## 2016-02-20 DIAGNOSIS — R262 Difficulty in walking, not elsewhere classified: Secondary | ICD-10-CM

## 2016-02-20 NOTE — Therapy (Addendum)
[facility] [address] [place], [place], [address] Phone: [phone]   Fax:  [phone]  Physical Therapy Treatment  Patient Details  Name: Rachael [name] MRN: [id number] Date of Birth: [birth year] Referring Provider: Lahoma Rocker, MD  Encounter Date: 02/20/2016      PT End of Session - 02/20/16 1352    Visit Number 5   Number of Visits 16   Date for PT Re-Evaluation 04/06/16   Authorization Type [employer] G Code every 10th visit   PT Start Time 1017   PT Stop Time 1100   PT Time Calculation (min) 43 min   Activity Tolerance Patient tolerated treatment well   Behavior During Therapy [facility] for tasks assessed/performed      Past Medical History:  Diagnosis Date  . Breast cancer (HCC)    R  . CAROTID STENOSIS   . Chronic venous insufficiency   . CONTACT DERMATITIS   . DUPUYTREN'S CONTRACTURE   . Edema    pt  states had edema of feet and legs has been on lasix per dr. to help  . GLUCOSE INTOLERANCE   . HLD (hyperlipidemia)   . Hypothyroidism    Dr. Ala Dach in [place]   . MENOPAUSAL SYNDROME   . Obesity   . Osteoarthritis    bilat hips, severe  . PVD (peripheral vascular disease) ([place])    mild carotid 11/05  . VITAMIN D DEFICIENCY     Past Surgical History:  Procedure Laterality Date  . 2D echo  11/29/03   normal LV size. normal EF   . [place]   lumbarectomy  . BREAST BIOPSY  05/11/11   right breast  . BREAST LUMPECTOMY     right breast  . CHOLECYSTECTOMY    . FINGER SURGERY    . HERNIA REPAIR     incisional hernia  . INGUINAL HERNIA REPAIR    . JOINT REPLACEMENT Bilateral    Dr. Maureen Ralphs  . normal caronary arteries     per pt. 2003  . right rotator cuff surgery    . TONSILLECTOMY    . TOTAL HIP ARTHROPLASTY  05/2008 and 10/2008   B hip - alusio    There were no vitals filed for this visit.      Subjective Assessment - 02/20/16 1019    Subjective Pt continues to  report some soreness in L hip and continues to have episodes of LE weakness after coming off of medication; noting decreased endurance.  Is performing exercises with core activation and is noting soreness in abdominals after exercises.  Did not have time to perform stretches this am.   Pertinent History 2013 breast CA with lumpectomy, h/o bilat THA   Limitations Walking;House hold activities   Patient Stated Goals Walk normally again and not use AD   Currently in Pain? No/denies                         [facility] Adult PT Treatment/Exercise - 02/20/16 1024      Ambulation/Gait   Ambulation/Gait Yes   Ambulation/Gait Assistance 4: Min assist   Ambulation/Gait Assistance Details Performed gait forwards and backwards x 10 feet x 5 reps with use of mirror as feedback for trunk in midline and level pelvis positioning with activation of R hip ABD   Ambulation Distance (Feet) 50 Feet   Assistive device None     Lumbar Exercises: Stretches   Pelvic Tilt Other (  comment)  10 reps, lateral pelvic tilts to L and R   Standing Side Bend 1 rep;30 seconds  R and L side in sitting     Knee/Hip Exercises: Stretches   Hip Flexor Stretch Right;Left;1 rep;60 seconds   Hip Flexor Stretch Limitations --  Supine with LE off side of mat     Knee/Hip Exercises: Standing   Hip Abduction Stengthening;Right;Knee straight   Abduction Limitations Closed chain with R foot on step, L foot off step-isometric activation to keep hips level-mirror as visual feedback, 3 reps x 10 second hold   Lateral Step Up Right;Left;2 sets;Hand Hold: 1;Step Height: 6"  foot taps-no step ups   Forward Step Up Right;Left;2 sets;10 reps;Hand Hold: 1;Step Height: 6"  foot taps only, no step ups                PT Education - 02/20/16 1351    Education provided Yes   Education Details closed chain activation of R glute med/ABD   Person(s) Educated Patient   Methods Explanation;Demonstration   Comprehension  Verbalized understanding;Returned demonstration          PT Short Term Goals - 02/10/16 1542      PT SHORT TERM GOAL #1   Title Will improve five time sit to stand time to 12 seconds without use of UE for improved functional LE strength (TARGET DATE FOR ALL STG 03/05/16)   Baseline 18.72   Status New     PT SHORT TERM GOAL #2   Title Will decrease falls risk as indicated by decrease in TUG score <15 seconds without RW    Baseline 17.5   Status New     PT SHORT TERM GOAL #3   Title Will ambulate up/down 5 stairs with one rail with alternating method and Mod I   Baseline Min A with one rail   Status New     PT SHORT TERM GOAL #4   Title Pt will perform gait on level, indoor surfaces without RW x 150' with supervision   Baseline min-mod A without RW   Status New     PT SHORT TERM GOAL #5   Title Will perform BERG to assess balance deficits and LTG to be set   Baseline Goal met 02/10/16; LTG written   Status Achieved           PT Long Term Goals - 02/10/16 1543      PT LONG TERM GOAL #1   Title Will improve hip and knee flexion strength to 5/5 bilaterally (TARGET DATE FOR ALL LTG 04/06/16)   Status New     PT LONG TERM GOAL #2   Title Will improve functional LE strength as indicated by five times sit to stand time of <12 seconds without RW   Status New     PT LONG TERM GOAL #3   Title Will decrease falls risk as indicated by a TUG score of <13 seconds without RW    Status New     PT LONG TERM GOAL #4   Title Will perform HEP independently   Status New     PT LONG TERM GOAL #5   Title Will perform ambulation over level and unlevel surfaces indoor/outdoor without AD and mod I x 500'   Status New     Additional Long Term Goals   Additional Long Term Goals Yes     PT LONG TERM GOAL #6   Title will decrease falls risk and will improve BERG  balance score to =/> 45/56 overall   Baseline 40/56   Status New      Late Entry G Code PT G-Codes  Functional  Assessment Tool Used (Outpatient Only)  TUG, Five Times Sit to Stand          Functional Limitation  Mobility: Walk- ing and moving around          Mobility: Walking and Moving Around Goal Status [phone])  CI          Mobility: Walking and Moving Around Discharge Status [phone])  CK             Laureen Abrahams, PT, DPT 03/17/16 7:29 AM For Raylene Everts, PT, DPT        Plan - 02/20/16 1638    Clinical Impression Statement Pt continues to make progress with core strength, ROM, balance and gait and has initiated gait training without use of AD.  Pt continues to present with R gluteal weakness limiting stability during dynamic standing balance and gait-will continue to address.   Rehab Potential Good   Clinical Impairments Affecting Rehab Potential 2 year history of LE weakness and steroid use   PT Treatment/Interventions ADLs/Self Care Home Management;Gait training;Stair training;Functional mobility training;DME Instruction;Therapeutic activities;Therapeutic exercise;Balance training;Neuromuscular re-education;Patient/family education   PT Next Visit Plan Hip ROM and ABD strengthening, dynamic balance and gait working towards no UE support, stair negotiation, endurance   Consulted and Agree with Plan of Care Patient      Patient will benefit from skilled therapeutic intervention in order to improve the following deficits and impairments:  Abnormal gait, Decreased balance, Decreased strength, Difficulty walking, Decreased endurance  Visit Diagnosis: Muscle weakness (generalized)  Difficulty in walking, not elsewhere classified  Other abnormalities of gait and mobility  Unsteadiness on feet     Problem List Patient Active Problem List   Diagnosis Date Noted  . SIRS (systemic inflammatory response syndrome) (HCC)   . Weakness of both lower extremities   . Arthritis   . Sepsis secondary to UTI ([place]) 03/18/2015  . Gout attack 10/23/2014  . Toe effusion 05/24/2014  . Anemia  in neoplastic disease 04/02/2014  . CKD (chronic kidney disease), stage III 04/02/2014  . Wound infection 03/18/2014  . Cellulitis 03/18/2014  . Acute renal failure ([place]) 03/18/2014  . Leg edema, right   . Open wound of leg 02/22/2014  . Breast cancer, right breast ([place]) 05/13/2011  . Incisional hernia, incarcerated, right upper quadrant 07/13/2010  . HEADACHE 11/06/2009  . CONTACT DERMATITIS 06/13/2009  . FATIGUE 06/13/2009  . EDEMA 05/01/2008  . BACK PAIN 03/29/2008  . CAROTID STENOSIS 03/16/2008  . ANXIETY 11/14/2007  . MENOPAUSAL SYNDROME 09/27/2007  . GLUCOSE INTOLERANCE 08/10/2007  . HYPERLIPIDEMIA 08/10/2007  . PERIPHERAL VASCULAR DISEASE 08/10/2007  . Venous (peripheral) insufficiency 08/10/2007  . Vitamin D deficiency 12/26/2006  . OBESITY 12/26/2006  . DUPUYTREN'S CONTRACTURE 12/26/2006  . Hypothyroidism 11/05/2006  . OSTEOARTHRITIS 11/05/2006   Raylene Everts, PT, DPT 02/20/16    4:44 PM   [facility] [address] [place], [place], [address] Phone: [phone]   Fax:  [phone]  Name: JEM CASTRO MRN: [id number] Date of Birth: 05/12/1933

## 2016-02-23 ENCOUNTER — Ambulatory Visit: Payer: PPO | Admitting: Physical Therapy

## 2016-02-23 DIAGNOSIS — R200 Anesthesia of skin: Secondary | ICD-10-CM | POA: Diagnosis not present

## 2016-02-23 DIAGNOSIS — R29898 Other symptoms and signs involving the musculoskeletal system: Secondary | ICD-10-CM | POA: Diagnosis not present

## 2016-02-25 ENCOUNTER — Telehealth: Payer: Self-pay | Admitting: Family Medicine

## 2016-02-25 ENCOUNTER — Ambulatory Visit: Payer: PPO | Admitting: Physical Therapy

## 2016-02-25 DIAGNOSIS — I739 Peripheral vascular disease, unspecified: Secondary | ICD-10-CM | POA: Diagnosis not present

## 2016-02-25 DIAGNOSIS — M11261 Other chondrocalcinosis, right knee: Secondary | ICD-10-CM | POA: Diagnosis not present

## 2016-02-25 DIAGNOSIS — R3915 Urgency of urination: Secondary | ICD-10-CM | POA: Diagnosis not present

## 2016-02-25 DIAGNOSIS — M4716 Other spondylosis with myelopathy, lumbar region: Secondary | ICD-10-CM | POA: Diagnosis not present

## 2016-02-25 DIAGNOSIS — M069 Rheumatoid arthritis, unspecified: Secondary | ICD-10-CM | POA: Diagnosis not present

## 2016-02-25 DIAGNOSIS — R531 Weakness: Secondary | ICD-10-CM | POA: Diagnosis not present

## 2016-02-25 DIAGNOSIS — M25561 Pain in right knee: Secondary | ICD-10-CM | POA: Diagnosis not present

## 2016-02-25 DIAGNOSIS — N390 Urinary tract infection, site not specified: Secondary | ICD-10-CM | POA: Diagnosis not present

## 2016-02-25 DIAGNOSIS — M353 Polymyalgia rheumatica: Secondary | ICD-10-CM | POA: Diagnosis not present

## 2016-02-25 DIAGNOSIS — M25562 Pain in left knee: Secondary | ICD-10-CM | POA: Diagnosis not present

## 2016-02-25 DIAGNOSIS — E878 Other disorders of electrolyte and fluid balance, not elsewhere classified: Secondary | ICD-10-CM | POA: Diagnosis not present

## 2016-02-25 DIAGNOSIS — R200 Anesthesia of skin: Secondary | ICD-10-CM | POA: Diagnosis not present

## 2016-02-25 DIAGNOSIS — I358 Other nonrheumatic aortic valve disorders: Secondary | ICD-10-CM | POA: Diagnosis not present

## 2016-02-25 DIAGNOSIS — R262 Difficulty in walking, not elsewhere classified: Secondary | ICD-10-CM | POA: Diagnosis not present

## 2016-02-25 DIAGNOSIS — R768 Other specified abnormal immunological findings in serum: Secondary | ICD-10-CM | POA: Diagnosis not present

## 2016-02-25 DIAGNOSIS — M25551 Pain in right hip: Secondary | ICD-10-CM | POA: Diagnosis not present

## 2016-02-25 DIAGNOSIS — M25559 Pain in unspecified hip: Secondary | ICD-10-CM | POA: Diagnosis not present

## 2016-02-25 DIAGNOSIS — M48062 Spinal stenosis, lumbar region with neurogenic claudication: Secondary | ICD-10-CM | POA: Diagnosis not present

## 2016-02-25 DIAGNOSIS — I77810 Thoracic aortic ectasia: Secondary | ICD-10-CM | POA: Diagnosis not present

## 2016-02-25 DIAGNOSIS — E860 Dehydration: Secondary | ICD-10-CM | POA: Diagnosis not present

## 2016-02-25 DIAGNOSIS — Z96643 Presence of artificial hip joint, bilateral: Secondary | ICD-10-CM | POA: Diagnosis not present

## 2016-02-25 DIAGNOSIS — R29898 Other symptoms and signs involving the musculoskeletal system: Secondary | ICD-10-CM | POA: Diagnosis not present

## 2016-02-25 DIAGNOSIS — R9431 Abnormal electrocardiogram [ECG] [EKG]: Secondary | ICD-10-CM | POA: Diagnosis not present

## 2016-02-25 DIAGNOSIS — M6281 Muscle weakness (generalized): Secondary | ICD-10-CM | POA: Diagnosis not present

## 2016-02-25 DIAGNOSIS — R32 Unspecified urinary incontinence: Secondary | ICD-10-CM | POA: Diagnosis not present

## 2016-02-25 DIAGNOSIS — E039 Hypothyroidism, unspecified: Secondary | ICD-10-CM | POA: Diagnosis not present

## 2016-02-25 DIAGNOSIS — M50321 Other cervical disc degeneration at C4-C5 level: Secondary | ICD-10-CM | POA: Diagnosis not present

## 2016-02-25 DIAGNOSIS — R296 Repeated falls: Secondary | ICD-10-CM | POA: Diagnosis not present

## 2016-02-25 DIAGNOSIS — Z853 Personal history of malignant neoplasm of breast: Secondary | ICD-10-CM | POA: Diagnosis not present

## 2016-02-25 DIAGNOSIS — R509 Fever, unspecified: Secondary | ICD-10-CM | POA: Diagnosis not present

## 2016-02-25 DIAGNOSIS — M170 Bilateral primary osteoarthritis of knee: Secondary | ICD-10-CM | POA: Diagnosis not present

## 2016-02-25 DIAGNOSIS — M50322 Other cervical disc degeneration at C5-C6 level: Secondary | ICD-10-CM | POA: Diagnosis not present

## 2016-02-25 DIAGNOSIS — D72829 Elevated white blood cell count, unspecified: Secondary | ICD-10-CM | POA: Diagnosis not present

## 2016-02-25 DIAGNOSIS — G6289 Other specified polyneuropathies: Secondary | ICD-10-CM | POA: Diagnosis not present

## 2016-02-25 DIAGNOSIS — M25552 Pain in left hip: Secondary | ICD-10-CM | POA: Diagnosis not present

## 2016-02-25 DIAGNOSIS — R29818 Other symptoms and signs involving the nervous system: Secondary | ICD-10-CM | POA: Diagnosis not present

## 2016-02-25 LAB — CBC AND DIFFERENTIAL
HEMATOCRIT: 36 % (ref 36–46)
Hemoglobin: 11.9 g/dL — AB (ref 12.0–16.0)
NEUTROS ABS: 9 /uL
Platelets: 314 10*3/uL (ref 150–399)
WBC: 11.8 10*3/mL

## 2016-02-25 LAB — BASIC METABOLIC PANEL
BUN: 25 mg/dL — AB (ref 4–21)
Creatinine: 0.8 mg/dL (ref 0.5–1.1)
GLUCOSE: 128 mg/dL
Potassium: 3.9 mmol/L (ref 3.4–5.3)
SODIUM: 135 mmol/L — AB (ref 137–147)

## 2016-02-25 LAB — HEPATIC FUNCTION PANEL
ALT: 10 U/L (ref 7–35)
AST: 16 U/L (ref 13–35)
Alkaline Phosphatase: 58 U/L (ref 25–125)
BILIRUBIN, TOTAL: 2.2 mg/dL

## 2016-02-25 NOTE — Telephone Encounter (Signed)
Pts son would like to see if Dr. Sarajane Jews could assist him in getting his mother into a skill nursing facility [facility].  Pt was found on the floor by the son that came here from [place], [place] and he is scared to leave her.

## 2016-02-26 LAB — HIV ANTIBODY (ROUTINE TESTING W REFLEX): HIV: 0

## 2016-02-26 LAB — BASIC METABOLIC PANEL
BUN: 23 mg/dL — AB (ref 4–21)
CREATININE: 0.8 mg/dL (ref 0.5–1.1)
Glucose: 70 mg/dL
Potassium: 3.8 mmol/L (ref 3.4–5.3)
Sodium: 137 mmol/L (ref 137–147)

## 2016-02-26 LAB — CBC AND DIFFERENTIAL
HEMATOCRIT: 35 % — AB (ref 36–46)
HEMOGLOBIN: 11.6 g/dL — AB (ref 12.0–16.0)
PLATELETS: 273 10*3/uL (ref 150–399)
WBC: 8.4 10*3/mL

## 2016-02-26 LAB — TSH: TSH: 0.73 u[IU]/mL (ref 0.41–5.90)

## 2016-02-26 LAB — HEPATIC FUNCTION PANEL: Bilirubin, Direct: 0.5 mg/dL — AB (ref 0.01–0.4)

## 2016-02-26 LAB — POCT ERYTHROCYTE SEDIMENTATION RATE, NON-AUTOMATED: SED RATE: 61 mm

## 2016-02-26 NOTE — Telephone Encounter (Signed)
I spoke with son and gave below information.

## 2016-02-26 NOTE — Telephone Encounter (Signed)
This is something the family needs to initiate. They need to tour the facility, fill out an application, etc. They will send [place] forms after that

## 2016-02-27 LAB — CBC AND DIFFERENTIAL
HEMATOCRIT: 37 % (ref 36–46)
HEMOGLOBIN: 12.1 g/dL (ref 12.0–16.0)
PLATELETS: 292 10*3/uL (ref 150–399)
WBC: 8.8 10^3/mL

## 2016-02-27 LAB — BASIC METABOLIC PANEL
BUN: 18 mg/dL (ref 4–21)
Creatinine: 0.8 mg/dL (ref 0.5–1.1)
Glucose: 92 mg/dL
Potassium: 4 mmol/L (ref 3.4–5.3)
SODIUM: 141 mmol/L (ref 137–147)

## 2016-03-01 ENCOUNTER — Ambulatory Visit: Payer: PPO | Admitting: Physical Therapy

## 2016-03-02 DIAGNOSIS — M25551 Pain in right hip: Secondary | ICD-10-CM | POA: Diagnosis not present

## 2016-03-02 DIAGNOSIS — M353 Polymyalgia rheumatica: Secondary | ICD-10-CM | POA: Diagnosis not present

## 2016-03-02 DIAGNOSIS — M6281 Muscle weakness (generalized): Secondary | ICD-10-CM | POA: Diagnosis not present

## 2016-03-02 DIAGNOSIS — E878 Other disorders of electrolyte and fluid balance, not elsewhere classified: Secondary | ICD-10-CM | POA: Diagnosis not present

## 2016-03-02 DIAGNOSIS — M25562 Pain in left knee: Secondary | ICD-10-CM | POA: Diagnosis not present

## 2016-03-02 DIAGNOSIS — M25552 Pain in left hip: Secondary | ICD-10-CM | POA: Diagnosis not present

## 2016-03-02 DIAGNOSIS — R531 Weakness: Secondary | ICD-10-CM | POA: Diagnosis not present

## 2016-03-02 DIAGNOSIS — I739 Peripheral vascular disease, unspecified: Secondary | ICD-10-CM | POA: Diagnosis not present

## 2016-03-02 DIAGNOSIS — R296 Repeated falls: Secondary | ICD-10-CM | POA: Diagnosis not present

## 2016-03-02 DIAGNOSIS — M25561 Pain in right knee: Secondary | ICD-10-CM | POA: Diagnosis not present

## 2016-03-02 DIAGNOSIS — E039 Hypothyroidism, unspecified: Secondary | ICD-10-CM | POA: Diagnosis not present

## 2016-03-02 DIAGNOSIS — M069 Rheumatoid arthritis, unspecified: Secondary | ICD-10-CM | POA: Diagnosis not present

## 2016-03-03 ENCOUNTER — Non-Acute Institutional Stay (SKILLED_NURSING_FACILITY): Payer: PPO | Admitting: Adult Health

## 2016-03-03 ENCOUNTER — Encounter: Payer: Self-pay | Admitting: Adult Health

## 2016-03-03 ENCOUNTER — Ambulatory Visit: Payer: PPO | Admitting: Physical Therapy

## 2016-03-03 DIAGNOSIS — R5381 Other malaise: Secondary | ICD-10-CM | POA: Diagnosis not present

## 2016-03-03 DIAGNOSIS — E559 Vitamin D deficiency, unspecified: Secondary | ICD-10-CM

## 2016-03-03 DIAGNOSIS — R29898 Other symptoms and signs involving the musculoskeletal system: Secondary | ICD-10-CM

## 2016-03-03 DIAGNOSIS — K219 Gastro-esophageal reflux disease without esophagitis: Secondary | ICD-10-CM | POA: Diagnosis not present

## 2016-03-03 DIAGNOSIS — E039 Hypothyroidism, unspecified: Secondary | ICD-10-CM | POA: Diagnosis not present

## 2016-03-03 NOTE — Progress Notes (Signed)
This encounter was created in error - please disregard.

## 2016-03-03 NOTE — Progress Notes (Signed)
DATE:  03/03/2016   MRN:  [id number]  BIRTHDAY: 07-25-1933  Facility:  Nursing Home Location:  [facility] Room Number: 1002-A  LEVEL OF CARE:  SNF (31)  Contact Information    Name Relation Home Work Mobile   [place] Daughter [phone]     [place], [place] [phone]         Code Status History    Date Active Date Inactive Code Status Order ID Comments User Context   03/18/2015  4:41 AM 03/20/2015  7:49 PM Full Code [id number]  Etta Quill, DO ED   03/18/2014  8:33 PM 03/24/2014  3:04 PM Full Code [id number]  Toy Baker, MD Inpatient       Chief Complaint  Patient presents with  . Hospitalization Follow-up    HISTORY OF PRESENT ILLNESS:  This is an 82-YO female seen for hospital followup.  She was admitted to [facility] for short-term rehabilitation on 03/02/2016 following an admission at [facility] 02/25/2016-03/02/2016 for acute on chronic lower extremity weakness and etiology still not clear until discharge. She has been having bouts of lower extremity weakness which responds well to Prednisone so diagnosis of PMR is most likely. Lower extremity weakness/pain got better after starting low-dose steroids. She has PMH of breast cancer S/P lumpectomy, PMR versus rheumatoid arthritis, PVD, hypothyroidism and S/P bilateral hip replacements and right shoulder surgery.  She was seen in her room today and verbalized of lower extremity weakness but not pain.  PAST MEDICAL HISTORY:    CURRENT MEDICATIONS: Reviewed  Patient's Medications  New Prescriptions   No medications on file  Previous Medications   CHOLECALCIFEROL 5000 UNITS TABS    Take 1 tablet by mouth daily.   CYANOCOBALAMIN (B-12 PO)    Take 4,500 mcg by mouth daily.   LEVOTHYROXINE (SYNTHROID, LEVOTHROID) 125 MCG TABLET    Take 125 mcg by mouth daily before breakfast.   PANTOPRAZOLE (PROTONIX) 40 MG TABLET    Take 40 mg by mouth daily.   PREDNISONE  (DELTASONE) 20 MG TABLET    Take 20 mg by mouth daily with breakfast.   SOFT LENS PRODUCTS (REWETTING DROPS) SOLN    Apply 1 drop to eye as needed (Dry eyes).   SUCRALFATE (CARAFATE) 1 G TABLET    Take 1 g by mouth 2 (two) times daily.   UNABLE TO FIND    Take 6 capsules by mouth daily. Med Name: Lycopene/lutein/fruit extracts (Fruit and Vegetable Daily Oral)  Modified Medications   No medications on file  Discontinued Medications   CHOLECALCIFEROL (VITAMIN D3) 2000 UNITS TABS    Take 5,000 mg by mouth daily.   COLCHICINE 0.6 MG TABLET    Take 0.6 mg by mouth daily.   CYANOCOBALAMIN (B-12) 2500 MCG TABS    Take 1 tablet by mouth daily.   LEVOTHYROXINE (SYNTHROID, LEVOTHROID) 137 MCG TABLET    Take 125 mcg by mouth daily.    NUTRITIONAL SUPPLEMENTS (JUICE PLUS FIBRE) LIQD    Take 4 capsules by mouth 3 (three) times daily. 4 vegetable capsules with breakfast, 4 fruit capsules with lunch and 4 berry capsules with dinner.   OMEGA-3 ACID ETHYL ESTERS (LOVAZA) 1 G CAPSULE    Take 2 g by mouth 2 (two) times daily.   PREDNISONE (DELTASONE) 5 MG TABLET    Take 1 mg by mouth daily with breakfast.      Allergies  Allergen Reactions  . Shellfish Allergy Anaphylaxis and Hives  Hives all over the body.  . Percocet [Oxycodone-Acetaminophen] Hives  . Iodinated Diagnostic Agents     PT IS NOT AWARE OF IODINE ALLERGY, PREMEDICATED FOR PRIOR PREMEDS ONLY//A.C.  . Iodine Hives  . Prednisone     nausea     REVIEW OF SYSTEMS:  GENERAL: no change in appetite, no fatigue, no weight changes, no fever, chills or weakness EYES: Denies change in vision, dry eyes, eye pain, itching or discharge EARS: Denies change in hearing, ringing in ears, or earache NOSE: Denies nasal congestion or epistaxis MOUTH and THROAT: Denies oral discomfort, gingival pain or bleeding, pain from teeth or hoarseness   RESPIRATORY: no cough, SOB, DOE, wheezing, hemoptysis CARDIAC: no chest pain or palpitations GI: no abdominal  pain, diarrhea, constipation, heart burn, nausea or vomiting GU: Denies dysuria, frequency, hematuria, incontinence, or discharge PSYCHIATRIC: Denies feeling of depression or anxiety. No report of hallucinations, insomnia, paranoia, or agitation    PHYSICAL EXAMINATION  GENERAL APPEARANCE: Well nourished. In no acute distress. Normal body habitus SKIN:  Skin is warm and dry.  HEAD: Normal in size and contour. No evidence of trauma EYES: Lids open and close normally. No blepharitis, entropion or ectropion. PERRL. Conjunctivae are clear and sclerae are white. Lenses are without opacity EARS: Pinnae are normal. Patient hears normal voice tunes of the examiner MOUTH and THROAT: Lips are without lesions. Oral mucosa is moist and without lesions. Tongue is normal in shape, size, and color and without lesions NECK: supple, trachea midline, no neck masses, no thyroid tenderness, no thyromegaly LYMPHATICS: no LAN in the neck, no supraclavicular LAN RESPIRATORY: breathing is even & unlabored, BS CTAB CARDIAC: RRR, no murmur,no extra heart sounds, BLE 2+ edema GI: abdomen soft, normal BS, no masses, no tenderness, no hepatomegaly, no splenomegaly EXTREMITIES:  Able to move X 4 extremities, BLE weakness PSYCHIATRIC: Alert and oriented X 3. Affect and behavior are appropriate  LABS/RADIOLOGY: Labs reviewed: Basic Metabolic Panel:  Recent Labs  03/17/15 2302  03/19/15 0340  04/15/15 0919 10/02/15 1256 10/15/15 0943 02/25/16 02/26/16 02/27/16  NA 134*  < > 143  < > 143  --  143 135* 137 141  K 3.8  --  3.6  < > 3.9  --  4.1 3.9 3.8 4.0  CL 99*  --  107  --   --   --   --   --   --   --   CO2 25  --  25  --  26  --  28  --   --   --   GLUCOSE 120*  --  95  --  98  --  94  --   --   --   BUN 15  < > 11  < > 21.1  --  21.4 25* 23* 18  CREATININE 0.77  < > 0.63  < > 0.8  --  0.8 0.8 0.8 0.8  CALCIUM 9.2  --  9.1  --  10.1 10.0 10.3  --   --   --   < > = values in this interval not  displayed. Liver Function Tests:  Recent Labs  03/17/15 2302  04/15/15 0919 10/15/15 0943 02/25/16  AST 16  < > 10 16 16   ALT 11*  < > 10 12 10   ALKPHOS 65  < > 72 77 58  BILITOT 0.8  --  0.56 1.44*  --   PROT 6.3*  --  6.9 7.1  --   ALBUMIN  2.6*  --  3.1* 3.5  --   < > = values in this interval not displayed. CBC:  Recent Labs  03/20/15 0325  04/15/15 0919 10/15/15 0943 02/25/16 02/26/16 02/27/16  WBC 6.1  < > 9.4 9.2 11.8 8.4 8.8  NEUTROABS  --   --  6.7* 5.7 9  --   --   HGB 9.7*  < > 11.6 13.1 11.9* 11.6* 12.1  HCT 31.8*  < > 35.9 40.5 36 35* 37  MCV 80.1  --  79.4* 87.3  --   --   --   PLT 591*  < > 413* 238 314 273 292  < > = values in this interval not displayed. Lipid Panel:  Recent Labs  10/02/15 1050  HDL 71.70   Cardiac Enzymes:  Recent Labs  03/18/15 0006 03/18/15 2328  CKTOTAL 18* 14*    ASSESSMENT/PLAN:  Physical deconditioning - for rehabilitation, PT and OT, for therapeutic strengthening exercises; fall precautions;  Acute on chronic bilateral lower extremity weakness - responded well after starting low-dose steroids, continue prednisone 20 mg 1 tab by mouth daily; follow-up with Dr. Sonny Dandy at [facility] on 03/11/16 at 2 PM and follow-up with Dr. Wendall Mola, Rheumatology on 05/24/16  Hypothyroidism - continue levothyroxine 125 g 1 tab by mouth daily Lab Results  Component Value Date   TSH 0.73 02/26/2016   Vitamin D deficiency - continue vitamin D3 5000 units 1 tab by mouth daily  GERD - continue sucralfate 1 g 1 tab by mouth twice a day and pantoprazole 40 mg 1 tab by mouth daily    Goals of care:  Short-term rehabilitation   Malarie Tappen C. Cary - NP    [employer] [phone]

## 2016-03-08 ENCOUNTER — Ambulatory Visit: Payer: PPO | Admitting: Physical Therapy

## 2016-03-09 ENCOUNTER — Encounter: Payer: Self-pay | Admitting: Adult Health

## 2016-03-09 ENCOUNTER — Non-Acute Institutional Stay (SKILLED_NURSING_FACILITY): Payer: PPO | Admitting: Adult Health

## 2016-03-09 DIAGNOSIS — E039 Hypothyroidism, unspecified: Secondary | ICD-10-CM

## 2016-03-09 DIAGNOSIS — K219 Gastro-esophageal reflux disease without esophagitis: Secondary | ICD-10-CM | POA: Diagnosis not present

## 2016-03-09 DIAGNOSIS — R5381 Other malaise: Secondary | ICD-10-CM | POA: Diagnosis not present

## 2016-03-09 DIAGNOSIS — E559 Vitamin D deficiency, unspecified: Secondary | ICD-10-CM

## 2016-03-09 DIAGNOSIS — R29898 Other symptoms and signs involving the musculoskeletal system: Secondary | ICD-10-CM | POA: Diagnosis not present

## 2016-03-09 NOTE — Progress Notes (Signed)
DATE:  03/09/2016   MRN:  [id number]  BIRTHDAY: 02-27-1933  Facility:  Nursing Home Location:  [place] Room Number: 1002-A  LEVEL OF CARE:  SNF (31)  Contact Information    Name Relation Home Work Mobile   [place] Daughter [phone]     [place], Nitchman [phone]         Code Status History    Date Active Date Inactive Code Status Order ID Comments User Context   03/18/2015  4:41 AM 03/20/2015  7:49 PM Full Code [id number]  Etta Quill, DO ED   03/18/2014  8:33 PM 03/24/2014  3:04 PM Full Code [id number]  Toy Baker, MD Inpatient       Chief Complaint  Patient presents with  . Discharge Note    HISTORY OF PRESENT ILLNESS:  This is an 82-YO female seen for discharge.  She will discharge home on 03/10/2016 with home health OT and PT.    She was admitted to [facility] for short-term rehabilitation on 03/02/2016 following an admission at [facility] 02/25/2016-03/02/2016 for acute on chronic lower extremity weakness and etiology still not clear until discharge. She has been having bouts of lower extremity weakness which responds well to Prednisone so diagnosis of PMR is most likely. Lower extremity weakness/pain got better after starting low-dose steroids. She has PMH of breast cancer S/P lumpectomy, PMR versus rheumatoid arthritis, PVD, hypothyroidism and S/P bilateral hip replacements and right shoulder surgery.   Patient was admitted to this facility for short-term rehabilitation after the patient's recent hospitalization.  Patient has completed SNF rehabilitation and therapy has cleared the patient for discharge.    PAST MEDICAL HISTORY:  Past Medical History:  Diagnosis Date  . Breast cancer (HCC)    R  . CAROTID STENOSIS   . Chronic venous insufficiency   . CONTACT DERMATITIS   . DUPUYTREN'S CONTRACTURE   . Edema    pt  states had edema of feet and legs has been on lasix per dr. to help  . GLUCOSE  INTOLERANCE   . HLD (hyperlipidemia)   . Hypothyroidism    Dr. Ala Dach in [place]   . MENOPAUSAL SYNDROME   . Obesity   . Osteoarthritis    bilat hips, severe  . PVD (peripheral vascular disease) ([place])    mild carotid 11/05  . VITAMIN D DEFICIENCY      CURRENT MEDICATIONS: Reviewed  Patient's Medications  New Prescriptions   No medications on file  Previous Medications   CHOLECALCIFEROL 5000 UNITS TABS    Take 1 tablet by mouth daily.   CYANOCOBALAMIN (B-12 PO)    Take 4,500 mcg by mouth daily.   LEVOTHYROXINE (SYNTHROID, LEVOTHROID) 125 MCG TABLET    Take 125 mcg by mouth daily before breakfast.   PANTOPRAZOLE (PROTONIX) 40 MG TABLET    Take 40 mg by mouth daily.   PREDNISONE (DELTASONE) 20 MG TABLET    Take 20 mg by mouth daily with breakfast.   SOFT LENS PRODUCTS (REWETTING DROPS) SOLN    Apply 1 drop to eye as needed (Dry eyes).   SUCRALFATE (CARAFATE) 1 G TABLET    Take 1 g by mouth 2 (two) times daily.  Modified Medications   No medications on file  Discontinued Medications   UNABLE TO FIND    Take 6 capsules by mouth daily. Med Name: Lycopene/lutein/fruit extracts (Fruit and Vegetable Daily Oral)     Allergies  Allergen Reactions  .  Shellfish Allergy Anaphylaxis and Hives    Hives all over the body.  . Percocet [Oxycodone-Acetaminophen] Hives  . Iodinated Diagnostic Agents     PT IS NOT AWARE OF IODINE ALLERGY, PREMEDICATED FOR PRIOR PREMEDS ONLY//A.C.  . Iodine Hives  . Prednisone     nausea     REVIEW OF SYSTEMS:  GENERAL: no change in appetite, no fatigue, no weight changes, no fever, chills or weakness SKIN: Denies rash, itching, wounds, ulcer sores, or nail abnormality EYES: Denies change in vision, dry eyes, eye pain, itching or discharge EARS: Denies change in hearing, ringing in ears, or earache NOSE: Denies nasal congestion or epistaxis MOUTH and THROAT: Denies oral discomfort, gingival pain or bleeding, pain from teeth or hoarseness     RESPIRATORY: no cough, SOB, DOE, wheezing, hemoptysis CARDIAC: no chest pain or palpitations GI: no abdominal pain, diarrhea, constipation, heart burn, nausea or vomiting GU: Denies dysuria, frequency, hematuria, incontinence, or discharge PSYCHIATRIC: Denies feeling of depression or anxiety. No report of hallucinations, insomnia, paranoia, or agitation    PHYSICAL EXAMINATION  GENERAL APPEARANCE: Well nourished. In no acute distress. Obese SKIN:  Skin is warm and dry.  HEAD: Normal in size and contour. No evidence of trauma EYES: Lids open and close normally. No blepharitis, entropion or ectropion. PERRL. Conjunctivae are clear and sclerae are white. Lenses are without opacity EARS: Pinnae are normal. Patient hears normal voice tunes of the examiner MOUTH and THROAT: Lips are without lesions. Oral mucosa is moist and without lesions. Tongue is normal in shape, size, and color and without lesions NECK: supple, trachea midline, no neck masses, no thyroid tenderness, no thyromegaly LYMPHATICS: no LAN in the neck, no supraclavicular LAN RESPIRATORY: breathing is even & unlabored, BS CTAB CARDIAC: RRR, no murmur,no extra heart sounds, BLE 2+ edema GI: abdomen soft, normal BS, no masses, no tenderness, no hepatomegaly, no splenomegaly EXTREMITIES:  Able to move X 4 extremities, walks with walker PSYCHIATRIC: Alert and oriented X 3. Affect and behavior are appropriate   LABS/RADIOLOGY: Labs reviewed: Basic Metabolic Panel:  Recent Labs  03/17/15 2302  03/19/15 0340  04/15/15 0919 10/02/15 1256 10/15/15 0943 02/25/16 02/26/16 02/27/16  NA 134*  < > 143  < > 143  --  143 135* 137 141  K 3.8  --  3.6  < > 3.9  --  4.1 3.9 3.8 4.0  CL 99*  --  107  --   --   --   --   --   --   --   CO2 25  --  25  --  26  --  28  --   --   --   GLUCOSE 120*  --  95  --  98  --  94  --   --   --   BUN 15  < > 11  < > 21.1  --  21.4 25* 23* 18  CREATININE 0.77  < > 0.63  < > 0.8  --  0.8 0.8 0.8 0.8   CALCIUM 9.2  --  9.1  --  10.1 10.0 10.3  --   --   --   < > = values in this interval not displayed. Liver Function Tests:  Recent Labs  03/17/15 2302  04/15/15 0919 10/15/15 0943 02/25/16  AST 16  < > 10 16 16   ALT 11*  < > 10 12 10   ALKPHOS 65  < > 72 77 58  BILITOT 0.8  --  0.56 1.44*  --   PROT 6.3*  --  6.9 7.1  --   ALBUMIN 2.6*  --  3.1* 3.5  --   < > = values in this interval not displayed. CBC:  Recent Labs  03/20/15 0325  04/15/15 0919 10/15/15 0943 02/25/16 02/26/16 02/27/16  WBC 6.1  < > 9.4 9.2 11.8 8.4 8.8  NEUTROABS  --   --  6.7* 5.7 9  --   --   HGB 9.7*  < > 11.6 13.1 11.9* 11.6* 12.1  HCT 31.8*  < > 35.9 40.5 36 35* 37  MCV 80.1  --  79.4* 87.3  --   --   --   PLT 591*  < > 413* 238 314 273 292  < > = values in this interval not displayed. Lipid Panel:  Recent Labs  10/02/15 1050  HDL 71.70   Cardiac Enzymes:  Recent Labs  03/18/15 0006 03/18/15 2328  CKTOTAL 18* 14*    ASSESSMENT/PLAN:  Physical deconditioning - for Home health PT and OT, for therapeutic strengthening exercises; fall precautions;  Chronic bilateral lower extremity weakness -  continue prednisone 20 mg 1 tab by mouth daily; follow-up with Dr. Sonny Dandy at [facility] on 03/11/16 at 2 PM and follow-up with Dr. Wendall Mola, Rheumatology on 05/24/16  Hypothyroidism - continue levothyroxine 125 g 1 tab by mouth daily Lab Results  Component Value Date   TSH 0.73 02/26/2016   Vitamin D deficiency - continue vitamin D3 5000 units 1 tab by mouth daily  GERD - continue sucralfate 1 g 1 tab by mouth twice a day and pantoprazole 40 mg 1 tab by mouth daily     I have filled out patient's discharge paperwork and written prescriptions.  Patient will receive home health PT and OT.  DME provided:  None  Total discharge time:  Less than 30 minutes  Discharge time involved coordination of the discharge process with social worker, nursing staff and  therapy department. Medical justification for home health services verified.    Hannahgrace Lalli C. Conyers - NP    [employer] [phone]

## 2016-03-10 ENCOUNTER — Ambulatory Visit: Payer: PPO | Admitting: Physical Therapy

## 2016-03-11 DIAGNOSIS — M353 Polymyalgia rheumatica: Secondary | ICD-10-CM | POA: Diagnosis not present

## 2016-03-11 DIAGNOSIS — G609 Hereditary and idiopathic neuropathy, unspecified: Secondary | ICD-10-CM | POA: Diagnosis not present

## 2016-03-12 ENCOUNTER — Telehealth: Payer: Self-pay | Admitting: Family Medicine

## 2016-03-12 NOTE — Telephone Encounter (Signed)
Darlina Guys from [facility] called stating that there would a delay of care until Monday March 5 and would like a call back to confirm that this is ok.  Contact Info: [phone]

## 2016-03-12 NOTE — Telephone Encounter (Signed)
Yes that would be okay

## 2016-03-15 ENCOUNTER — Ambulatory Visit: Payer: PPO | Admitting: Physical Therapy

## 2016-03-15 NOTE — Telephone Encounter (Signed)
I spoke with [place] and gave below message.

## 2016-03-16 ENCOUNTER — Encounter: Payer: Self-pay | Admitting: Physical Therapy

## 2016-03-16 DIAGNOSIS — M6281 Muscle weakness (generalized): Secondary | ICD-10-CM

## 2016-03-16 DIAGNOSIS — R2681 Unsteadiness on feet: Secondary | ICD-10-CM

## 2016-03-16 DIAGNOSIS — R262 Difficulty in walking, not elsewhere classified: Secondary | ICD-10-CM | POA: Insufficient documentation

## 2016-03-16 DIAGNOSIS — R2689 Other abnormalities of gait and mobility: Secondary | ICD-10-CM | POA: Insufficient documentation

## 2016-03-16 NOTE — Telephone Encounter (Signed)
Per Dr. Sarajane Jews okay to start today.

## 2016-03-16 NOTE — Telephone Encounter (Signed)
Rachael [name] called back to advise they will not start Home Health PT until today.  Plans changed, did not start Monday.

## 2016-03-16 NOTE — Therapy (Signed)
[facility] [address] [place], [place], [address] Phone: [phone]   Fax:  [phone]  March 16, 2016   @[email or web address] @  Physical Therapy Discharge Summary  Patient: Rachael [name]  MRN: [id number]  Date of Birth: 1933/04/26   Diagnosis: Muscle weakness (generalized)  Difficulty in walking, not elsewhere classified  Other abnormalities of gait and mobility  Unsteadiness on feet Referring Provider: Lahoma Rocker, MD  The above patient had been seen in Physical Therapy 5 times of 16 treatments scheduled with 0 no shows and 7 cancellations.  The treatment consisted of LE strengthening, ROM, postural training, dynamic balance, gait and endurance training The patient is: Improved  Subjective: Due to recent hospitalization pt unable to return to therapy at this time.  She is currently receiving HHPT and will require new orders in order to resume OPPT.  Discharge Findings: Unable to assess-pt unable to return to PT  Functional Status at Discharge: D/C home with HHPT after hospitalization  Goals: unable to assess due to pt unable to return to OPPT    Sincerely,  Raylene Everts, PT, DPT 03/16/16    12:12 PM    CC @CCLISTRESTNAME @  [facility] [address] [place], [place], [address] Phone: [phone]   Fax:  [phone]  Patient: Rachael [name]  MRN: [id number]  Date of Birth: 10/10/1933

## 2016-03-17 ENCOUNTER — Ambulatory Visit: Payer: PPO | Admitting: Physical Therapy

## 2016-03-22 ENCOUNTER — Ambulatory Visit: Payer: PPO | Admitting: Physical Therapy

## 2016-03-24 ENCOUNTER — Ambulatory Visit: Payer: PPO | Admitting: Physical Therapy

## 2016-03-29 ENCOUNTER — Ambulatory Visit: Payer: PPO | Admitting: Physical Therapy

## 2016-03-31 ENCOUNTER — Ambulatory Visit: Payer: PPO | Admitting: Physical Therapy

## 2016-04-05 ENCOUNTER — Ambulatory Visit: Payer: PPO | Admitting: Physical Therapy

## 2016-04-06 DIAGNOSIS — M62551 Muscle wasting and atrophy, not elsewhere classified, right thigh: Secondary | ICD-10-CM | POA: Diagnosis not present

## 2016-04-07 ENCOUNTER — Ambulatory Visit: Payer: PPO | Admitting: Physical Therapy

## 2016-04-20 DIAGNOSIS — Z7952 Long term (current) use of systemic steroids: Secondary | ICD-10-CM | POA: Diagnosis not present

## 2016-04-20 DIAGNOSIS — M351 Other overlap syndromes: Secondary | ICD-10-CM | POA: Diagnosis not present

## 2016-04-20 DIAGNOSIS — R29898 Other symptoms and signs involving the musculoskeletal system: Secondary | ICD-10-CM | POA: Diagnosis not present

## 2016-04-20 DIAGNOSIS — M62562 Muscle wasting and atrophy, not elsewhere classified, left lower leg: Secondary | ICD-10-CM | POA: Diagnosis not present

## 2016-04-20 DIAGNOSIS — M6281 Muscle weakness (generalized): Secondary | ICD-10-CM | POA: Diagnosis not present

## 2016-04-20 DIAGNOSIS — G7289 Other specified myopathies: Secondary | ICD-10-CM | POA: Diagnosis not present

## 2016-04-20 DIAGNOSIS — M609 Myositis, unspecified: Secondary | ICD-10-CM | POA: Diagnosis not present

## 2016-04-20 DIAGNOSIS — E039 Hypothyroidism, unspecified: Secondary | ICD-10-CM | POA: Diagnosis not present

## 2016-04-20 DIAGNOSIS — M62561 Muscle wasting and atrophy, not elsewhere classified, right lower leg: Secondary | ICD-10-CM | POA: Diagnosis not present

## 2016-04-20 DIAGNOSIS — Z79899 Other long term (current) drug therapy: Secondary | ICD-10-CM | POA: Diagnosis not present

## 2016-04-20 DIAGNOSIS — I7300 Raynaud's syndrome without gangrene: Secondary | ICD-10-CM | POA: Diagnosis not present

## 2016-04-20 DIAGNOSIS — R531 Weakness: Secondary | ICD-10-CM | POA: Diagnosis not present

## 2016-04-21 ENCOUNTER — Encounter: Payer: Self-pay | Admitting: Physical Therapy

## 2016-04-21 ENCOUNTER — Ambulatory Visit: Payer: PPO | Attending: Rheumatology | Admitting: Physical Therapy

## 2016-04-21 DIAGNOSIS — R29898 Other symptoms and signs involving the musculoskeletal system: Secondary | ICD-10-CM | POA: Insufficient documentation

## 2016-04-21 DIAGNOSIS — R269 Unspecified abnormalities of gait and mobility: Secondary | ICD-10-CM | POA: Insufficient documentation

## 2016-04-21 DIAGNOSIS — M6281 Muscle weakness (generalized): Secondary | ICD-10-CM | POA: Diagnosis not present

## 2016-04-21 DIAGNOSIS — R2689 Other abnormalities of gait and mobility: Secondary | ICD-10-CM | POA: Diagnosis not present

## 2016-04-21 DIAGNOSIS — R2681 Unsteadiness on feet: Secondary | ICD-10-CM

## 2016-04-21 DIAGNOSIS — R262 Difficulty in walking, not elsewhere classified: Secondary | ICD-10-CM | POA: Diagnosis not present

## 2016-04-21 NOTE — Patient Instructions (Signed)
IT Band: Wall Lean    Stand with right hand on wall. Lean right hip toward wall with other arm supporting trunk. Hold _20__ seconds. Relax. Repeat _2__ times each leg.

## 2016-04-21 NOTE — Therapy (Signed)
[facility] [address] [place], [place], [address] Phone: [phone]   Fax:  [phone]  Physical Therapy Evaluation  Patient Details  Name: NIKAYA NASBY MRN: [id number] Date of Birth: [birth year] Referring Provider: Lahoma Rocker, MD  Encounter Date: 04/21/2016      PT End of Session - 04/21/16 1531    Visit Number 1   Number of Visits 17   Date for PT Re-Evaluation 06/20/16   Authorization Type [employer] G Code every 10th visit   PT Start Time 3428   PT Stop Time 1530   PT Time Calculation (min) 45 min   Activity Tolerance Patient tolerated treatment well   Behavior During Therapy [facility] for tasks assessed/performed      Past Medical History:  Diagnosis Date  . Breast cancer (HCC)    R  . CAROTID STENOSIS   . Chronic venous insufficiency   . CONTACT DERMATITIS   . DUPUYTREN'S CONTRACTURE   . Edema    pt  states had edema of feet and legs has been on lasix per dr. to help  . GLUCOSE INTOLERANCE   . HLD (hyperlipidemia)   . Hypothyroidism    Dr. Ala Dach in [place]   . MENOPAUSAL SYNDROME   . Obesity   . Osteoarthritis    bilat hips, severe  . PVD (peripheral vascular disease) ([place])    mild carotid 11/05  . VITAMIN D DEFICIENCY     Past Surgical History:  Procedure Laterality Date  . 2D echo  11/29/03   normal LV size. normal EF   . [place]   lumbarectomy  . BREAST BIOPSY  05/11/11   right breast  . BREAST LUMPECTOMY     right breast  . CHOLECYSTECTOMY    . FINGER SURGERY    . HERNIA REPAIR     incisional hernia  . INGUINAL HERNIA REPAIR    . JOINT REPLACEMENT Bilateral    Dr. Maureen Ralphs  . normal caronary arteries     per pt. 2003  . right rotator cuff surgery    . TONSILLECTOMY    . TOTAL HIP ARTHROPLASTY  05/2008 and 10/2008   B hip - alusio    There were no vitals filed for this visit.       Subjective Assessment - 04/21/16 1452    Subjective Pt returns to  OPPT for re-evaluation following hospitalization due to severe acute on chronic LE weakness and falls due to steroid taper and steroid myopathy.  Pt D/C from hospital on 2/28 to [facility] SNF x 1 week and then D/C home with HHPT.  Pt now D/C from [facility] and returns to OPPT for further therapy.     Pertinent History 2013 breast CA with lumpectomy, h/o bilat THA, hypothyroid, PVD   Limitations Walking;House hold activities  must hold on to furniture or RW   Patient Stated Goals Walk normally again and not use AD   Currently in Pain? No/denies            [facility] PT Assessment - 04/21/16 1457      Assessment   Medical Diagnosis LE weakness and falls   Referring Provider Lahoma Rocker, MD   Onset Date/Surgical Date 02/25/16   Hand Dominance Right   Next MD Visit Neurology    Prior Therapy January for LE weakness     Precautions   Precautions Other (comment);Fall   Precaution Comments h/o breast cancer with lumpectomy  Restrictions   Weight Bearing Restrictions No     Home Environment   Living Environment Private residence   Living Arrangements Alone   Available Help at Discharge Family;Available PRN/intermittently   Type of Home House   Home Access Stairs to enter   Entrance Stairs-Number of Steps 5   Entrance Stairs-Rails Right   Home Layout One level   [place] - 4 wheels;[place] - single point     Prior Function   Level of Independence Independent with household mobility with device;Independent with community mobility with device;Independent with homemaking with ambulation;Independent with gait;Independent with transfers;Requires assistive device for independence   Vocation Part time employment   Vocation Requirements [employer]     Cognition   Overall Cognitive Status Within Functional Limits for tasks assessed     Observation/Other Assessments   Focus on Therapeutic Outcomes (FOTO)  --     Sensation   Light Touch Appears Intact     ROM / Strength    AROM / PROM / Strength Strength     Strength   Overall Strength Deficits   Overall Strength Comments LLE: 4+/5 overall; RLE: 4+/5     Ambulation/Gait   Ambulation/Gait Yes   Ambulation/Gait Assistance 5: Supervision   Ambulation Distance (Feet) 100 Feet   Assistive device None   Gait Pattern Step-through pattern;Decreased stance time - right;Decreased stride length;Decreased weight shift to right;Lateral trunk lean to left;Trunk flexed;Decreased arm swing - right;Decreased arm swing - left   Ambulation Surface Level;Indoor   Stairs Yes   Stairs Assistance 5: Supervision   Stair Management Technique Two rails;Alternating pattern;Forwards   Number of Stairs 8   Height of Stairs 6     Balance   Balance Assessed Yes     Standardized Balance Assessment   Standardized Balance Assessment Five Times Sit to Stand;Timed Up and Go Test;10 meter walk test   Five times sit to stand comments  14 seconds without UE support   10 Meter Walk 15.44 seconds or 2.12 ft/sec     Timed Up and Go Test   TUG Normal TUG   Normal TUG (seconds) 18.63   TUG Comments without Rollator     High Level Balance   High Level Balance Activites Other (comment)   High Level Balance Comments SLS: 4 seconds on L, 2-3 on RLE                           PT Education - 04/21/16 1530    Education provided Yes   Education Details clinical findings, POC, goals and re-initiation of HEP-SLS, side stepping, hip flexion stretches and IT band stretches   Person(s) Educated Patient   Methods Explanation;Demonstration;Handout   Comprehension Verbalized understanding;Returned demonstration          PT Short Term Goals - 04/21/16 1537      PT SHORT TERM GOAL #1   Title Will improve five time sit to stand time to 12 seconds without use of UE for improved functional LE strength (TARGET DATE FOR ALL STG 05/21/16)   Baseline 14 seconds without UE    Time 4   Period Weeks   Status New     PT SHORT TERM  GOAL #2   Title Will decrease falls risk as indicated by decrease in TUG score <15 seconds without RW    Baseline 18.63 without RW   Time 4   Period Weeks   Status New  PT SHORT TERM GOAL #3   Title Will ambulate up/down 8 stairs with one rail with alternating method and Mod I   Baseline supervision with 2 rails   Time 4   Period Weeks   Status New     PT SHORT TERM GOAL #4   Title Pt will perform gait on level, indoor surfaces without RW x 150' with supervision   Baseline Min A without RW   Time 4   Period Weeks   Status New     PT SHORT TERM GOAL #5   Title Will perform BERG to assess balance deficits and LTG to be set   Time 4   Period Weeks   Status New           PT Long Term Goals - 04/21/16 1539      PT LONG TERM GOAL #1   Title Will improve hip and knee flexion strength to 5/5 bilaterally and will perform 5 times sit to stand in <12 seconds without UE support (TARGET DATE FOR ALL LTG 06/20/16)   Time 8   Period Weeks   Status New     PT LONG TERM GOAL #2   Title Pt will improve safety with gait as indicated by increase in 10 meter walk test/gait velocity to >2.62 ft/sec without AD   Baseline 2.12 ft/sec   Time 8   Period Weeks   Status New     PT LONG TERM GOAL #3   Title Will decrease falls risk as indicated by a TUG score of <13 seconds without RW    Time 8   Period Weeks   Status New     PT LONG TERM GOAL #4   Title Will perform HEP independently   Time 8   Period Weeks   Status New     PT LONG TERM GOAL #5   Title Will ambulate over uneven outdoor surfaces x 500 ft. and up/down 8 stairs with SPC mod I    Time 8   Period Weeks   Status New     PT LONG TERM GOAL #6   Title Pt will decrease falls risk and will improve BERG balance score to =/> 45/56 overall   Baseline TBD   Time 8   Period Weeks   Status New               Plan - 04/21/16 1532    Clinical Impression Statement Pt is an 83 year old female presenting to OPPT  neuro for low complexity PT evaluation for acute on chronic leg weakness from steroid taper and falls. Pt's PMH significant for the following: breast cancer with lumpectomy, 2 year steroid use, PVD, hypothyroid, RA vs polymyalgia rheumatic, bilat THA. The following deficits were noted during pt's exam: impaired functional LE strength and endurance, impaired posture, impaired balance, gait and increased falls risk. Pt's gait speed indicates pt is limited community ambulator. Pt's five times sit to stand time and TUG score indicates pt is at risk for falls. Pt would benefit from skilled PT to address these impairments and functional limitations to maximize functional mobility independence and reduce falls risk.   Rehab Potential Good   Clinical Impairments Affecting Rehab Potential 2 year history of LE weakness and steroid use   PT Frequency 2x / week   PT Duration 8 weeks   PT Treatment/Interventions ADLs/Self Care Home Management;Gait training;Stair training;Functional mobility training;DME Instruction;Therapeutic activities;Therapeutic exercise;Balance training;Neuromuscular re-education;Patient/family education;Passive range of motion;Taping   PT  Next Visit Plan BERG balance and set goal, ankle/hip strengthening, balance without UE support   Consulted and Agree with Plan of Care Patient      Patient will benefit from skilled therapeutic intervention in order to improve the following deficits and impairments:  Abnormal gait, Decreased balance, Decreased strength, Difficulty walking, Decreased endurance  Visit Diagnosis: Muscle weakness (generalized)  Difficulty in walking, not elsewhere classified  Unsteadiness on feet  Other abnormalities of gait and mobility      G-Codes - 05/10/16 1546    Functional Assessment Tool Used (Outpatient Only) gait velocity, TUG, five times sit to stand   Functional Limitation Mobility: Walking and moving around   Mobility: Walking and Moving Around Current  Status [phone]) At least 20 percent but less than 40 percent impaired, limited or restricted   Mobility: Walking and Moving Around Goal Status [phone]) At least 1 percent but less than 20 percent impaired, limited or restricted       Problem List Patient Active Problem List   Diagnosis Date Noted  . SIRS (systemic inflammatory response syndrome) (HCC)   . Weakness of both lower extremities   . Arthritis   . Sepsis secondary to UTI ([place]) 03/18/2015  . Gout attack 10/23/2014  . Toe effusion 05/24/2014  . Anemia in neoplastic disease 04/02/2014  . CKD (chronic kidney disease), stage III 04/02/2014  . Wound infection 03/18/2014  . Cellulitis 03/18/2014  . Acute renal failure ([place]) 03/18/2014  . Leg edema, right   . Open wound of leg 02/22/2014  . Breast cancer, right breast ([place]) 05/13/2011  . Incisional hernia, incarcerated, right upper quadrant 07/13/2010  . HEADACHE 11/06/2009  . CONTACT DERMATITIS 06/13/2009  . FATIGUE 06/13/2009  . EDEMA 05/01/2008  . BACK PAIN 03/29/2008  . CAROTID STENOSIS 03/16/2008  . ANXIETY 11/14/2007  . MENOPAUSAL SYNDROME 09/27/2007  . GLUCOSE INTOLERANCE 08/10/2007  . HYPERLIPIDEMIA 08/10/2007  . PERIPHERAL VASCULAR DISEASE 08/10/2007  . Venous (peripheral) insufficiency 08/10/2007  . Vitamin D deficiency 12/26/2006  . OBESITY 12/26/2006  . DUPUYTREN'S CONTRACTURE 12/26/2006  . Hypothyroidism 11/05/2006  . OSTEOARTHRITIS 11/05/2006    Raylene Everts, PT, DPT 05-10-2016    3:50 PM    [facility] [address] [place], [place], [address] Phone: [phone]   Fax:  [phone]  Name: MARGARETA LAUREANO MRN: [id number] Date of Birth: May 08, 1933

## 2016-04-22 DIAGNOSIS — E049 Nontoxic goiter, unspecified: Secondary | ICD-10-CM | POA: Diagnosis not present

## 2016-04-22 DIAGNOSIS — E018 Other iodine-deficiency related thyroid disorders and allied conditions: Secondary | ICD-10-CM | POA: Diagnosis not present

## 2016-04-26 DIAGNOSIS — Z124 Encounter for screening for malignant neoplasm of cervix: Secondary | ICD-10-CM | POA: Diagnosis not present

## 2016-04-28 ENCOUNTER — Ambulatory Visit: Payer: PPO | Admitting: Physical Therapy

## 2016-04-28 ENCOUNTER — Encounter: Payer: Self-pay | Admitting: Physical Therapy

## 2016-04-28 DIAGNOSIS — R2681 Unsteadiness on feet: Secondary | ICD-10-CM

## 2016-04-28 DIAGNOSIS — M6281 Muscle weakness (generalized): Secondary | ICD-10-CM

## 2016-04-28 DIAGNOSIS — R2689 Other abnormalities of gait and mobility: Secondary | ICD-10-CM

## 2016-04-28 DIAGNOSIS — R262 Difficulty in walking, not elsewhere classified: Secondary | ICD-10-CM

## 2016-04-28 NOTE — Therapy (Signed)
[facility] [address] [place], [place], [address] Phone: [phone]   Fax:  [phone]  Physical Therapy Treatment  Patient Details  Name: Rachael [name] MRN: [id number] Date of Birth: 1933/06/06 Referring Provider: Lahoma Rocker, MD  Encounter Date: 04/28/2016      PT End of Session - 04/28/16 1244    Visit Number 2   Number of Visits 17   Date for PT Re-Evaluation 06/20/16   Authorization Type [employer] G Code every 10th visit   PT Start Time 1153   PT Stop Time 1235   PT Time Calculation (min) 42 min   Activity Tolerance Patient tolerated treatment well   Behavior During Therapy [facility] for tasks assessed/performed      Past Medical History:  Diagnosis Date  . Breast cancer (HCC)    R  . CAROTID STENOSIS   . Chronic venous insufficiency   . CONTACT DERMATITIS   . DUPUYTREN'S CONTRACTURE   . Edema    pt  states had edema of feet and legs has been on lasix per dr. to help  . GLUCOSE INTOLERANCE   . HLD (hyperlipidemia)   . Hypothyroidism    Dr. Ala Dach in [place]   . MENOPAUSAL SYNDROME   . Obesity   . Osteoarthritis    bilat hips, severe  . PVD (peripheral vascular disease) ([place])    mild carotid 11/05  . VITAMIN D DEFICIENCY     Past Surgical History:  Procedure Laterality Date  . 2D echo  11/29/03   normal LV size. normal EF   . [place]   lumbarectomy  . BREAST BIOPSY  05/11/11   right breast  . BREAST LUMPECTOMY     right breast  . CHOLECYSTECTOMY    . FINGER SURGERY    . HERNIA REPAIR     incisional hernia  . INGUINAL HERNIA REPAIR    . JOINT REPLACEMENT Bilateral    Dr. Maureen Ralphs  . normal caronary arteries     per pt. 2003  . right rotator cuff surgery    . TONSILLECTOMY    . TOTAL HIP ARTHROPLASTY  05/2008 and 10/2008   B hip - alusio    There were no vitals filed for this visit.      Subjective Assessment - 04/28/16 1156    Subjective Pt reports  feeling much stronger; was able to spend most of the day out in the community yesterday running errands and visiting customers.  Was able to water her plants this am without the rollator or cane.  Is doing exercises daily without issues.   Pertinent History 2013 breast CA with lumpectomy, h/o bilat THA, hypothyroid, PVD   Limitations Walking;House hold activities   Patient Stated Goals Walk normally again and not use AD   Currently in Pain? No/denies            [facility] PT Assessment - 04/28/16 1200      Standardized Balance Assessment   Standardized Balance Assessment Berg Balance Test     Berg Balance Test   Sit to Stand Able to stand without using hands and stabilize independently   Standing Unsupported Able to stand safely 2 minutes   Sitting with Back Unsupported but Feet Supported on Floor or Stool Able to sit safely and securely 2 minutes   Stand to Sit Sits safely with minimal use of hands   Transfers Able to transfer safely, minor use of hands   Standing Unsupported  with Eyes Closed Able to stand 10 seconds with supervision   Standing Ubsupported with Feet Together Able to place feet together independently and stand 1 minute safely   From Standing, Reach Forward with Outstretched Arm Can reach confidently >25 cm (10")   From Standing Position, Pick up Object from [place] to pick up shoe safely and easily   From Standing Position, Turn to Look Behind Over each Shoulder Looks behind from both sides and weight shifts well   Turn 360 Degrees Able to turn 360 degrees safely one side only in 4 seconds or less   Standing Unsupported, Alternately Place Feet on Step/Stool Able to complete >2 steps/needs minimal assist   Standing Unsupported, One Foot in Front Needs help to step but can hold 15 seconds   Standing on One Leg Tries to lift leg/unable to hold 3 seconds but remains standing independently   Total Score 45   Berg comment: 45/56                     [facility] Adult  PT Treatment/Exercise - 04/28/16 1222      Knee/Hip Exercises: Supine   Bridges Limitations x 10 reps   Bridges with Clamshell Strengthening;Both;1 set;10 reps  red theraband             Balance Exercises - 04/28/16 1228      Balance Exercises: Standing   Standing Eyes Opened Narrow base of support (BOS);Head turns;Solid surface;Other reps (comment)  partial tandem, 10 reps head turns/nods, one UE support           PT Education - 04/28/16 1234    Education provided Yes   Education Details falls risk as indicated by BERG; update of HEP   Person(s) Educated Patient   Methods Explanation;Demonstration;Handout   Comprehension Verbalized understanding;Returned demonstration          PT Short Term Goals - 04/28/16 1247      PT SHORT TERM GOAL #1   Title Will improve five time sit to stand time to 12 seconds without use of UE for improved functional LE strength (TARGET DATE FOR ALL STG 05/21/16)   Baseline 14 seconds without UE    Time 4   Period Weeks   Status New     PT SHORT TERM GOAL #2   Title Will decrease falls risk as indicated by decrease in TUG score <15 seconds without RW    Baseline 18.63 without RW   Time 4   Period Weeks   Status New     PT SHORT TERM GOAL #3   Title Will ambulate up/down 8 stairs with one rail with alternating method and Mod I   Baseline supervision with 2 rails   Time 4   Period Weeks   Status New     PT SHORT TERM GOAL #4   Title Pt will perform gait on level, indoor surfaces without RW x 150' with supervision   Baseline Min A without RW   Time 4   Period Weeks   Status New     PT SHORT TERM GOAL #5   Title Will perform BERG to assess balance deficits and LTG to be set   Baseline Goal met 04/28/16; LTG set   Status Achieved           PT Long Term Goals - 04/28/16 1247      PT LONG TERM GOAL #1   Title Will improve hip and knee flexion strength to 5/5  bilaterally and will perform 5 times sit to stand in <12  seconds without UE support (TARGET DATE FOR ALL LTG 06/20/16)   Time 8   Period Weeks   Status New     PT LONG TERM GOAL #2   Title Pt will improve safety with gait as indicated by increase in 10 meter walk test/gait velocity to >2.62 ft/sec without AD   Baseline 2.12 ft/sec   Time 8   Period Weeks   Status New     PT LONG TERM GOAL #3   Title Will decrease falls risk as indicated by a TUG score of <13 seconds without RW    Time 8   Period Weeks   Status New     PT LONG TERM GOAL #4   Title Will perform HEP independently   Time 8   Period Weeks   Status New     PT LONG TERM GOAL #5   Title Will ambulate over uneven outdoor surfaces x 500 ft. and up/down 8 stairs with SPC mod I    Time 8   Period Weeks   Status New     PT LONG TERM GOAL #6   Title Pt will decrease falls risk and will improve BERG balance score to >50/56 overall   Baseline 45/56   Time 8   Period Weeks   Status Revised               Plan - 04/28/16 1244    Clinical Impression Statement Pt is showing improvement and good progress since returning to PT and continues to diligently perform HEP.  Performed assessment of balance and falls risk with BERG balance assessment with pt improving overall score from January to 45/56 but with increased difficulty with tasks with narrow BOS.  Provided pt with updated HEP; pt to initiate at home.  Will continue to address impairments.   Rehab Potential Good   Clinical Impairments Affecting Rehab Potential 2 year history of LE weakness and steroid use   PT Treatment/Interventions ADLs/Self Care Home Management;Gait training;Stair training;Functional mobility training;DME Instruction;Therapeutic activities;Therapeutic exercise;Balance training;Neuromuscular re-education;Patient/family education;Passive range of motion;Taping   PT Next Visit Plan Continue to work on hip strengthening/ROM, balance exercises and gait without UE support, endurance   Consulted and Agree with  Plan of Care Patient      Patient will benefit from skilled therapeutic intervention in order to improve the following deficits and impairments:  Abnormal gait, Decreased balance, Decreased strength, Difficulty walking, Decreased endurance  Visit Diagnosis: Muscle weakness (generalized)  Difficulty in walking, not elsewhere classified  Unsteadiness on feet  Other abnormalities of gait and mobility     Problem List Patient Active Problem List   Diagnosis Date Noted  . SIRS (systemic inflammatory response syndrome) (HCC)   . Weakness of both lower extremities   . Arthritis   . Sepsis secondary to UTI ([place]) 03/18/2015  . Gout attack 10/23/2014  . Toe effusion 05/24/2014  . Anemia in neoplastic disease 04/02/2014  . CKD (chronic kidney disease), stage III 04/02/2014  . Wound infection 03/18/2014  . Cellulitis 03/18/2014  . Acute renal failure ([place]) 03/18/2014  . Leg edema, right   . Open wound of leg 02/22/2014  . Breast cancer, right breast ([place]) 05/13/2011  . Incisional hernia, incarcerated, right upper quadrant 07/13/2010  . HEADACHE 11/06/2009  . CONTACT DERMATITIS 06/13/2009  . FATIGUE 06/13/2009  . EDEMA 05/01/2008  . BACK PAIN 03/29/2008  . CAROTID STENOSIS 03/16/2008  . ANXIETY 11/14/2007  .  MENOPAUSAL SYNDROME 09/27/2007  . GLUCOSE INTOLERANCE 08/10/2007  . HYPERLIPIDEMIA 08/10/2007  . PERIPHERAL VASCULAR DISEASE 08/10/2007  . Venous (peripheral) insufficiency 08/10/2007  . Vitamin D deficiency 12/26/2006  . OBESITY 12/26/2006  . DUPUYTREN'S CONTRACTURE 12/26/2006  . Hypothyroidism 11/05/2006  . OSTEOARTHRITIS 11/05/2006   Raylene Everts, PT, DPT 04/28/16    12:49 PM    [facility] [address] [place], [place], [address] Phone: [phone]   Fax:  [phone]  Name: HARLYNN KIMBELL MRN: [id number] Date of Birth: [birth year]

## 2016-04-28 NOTE — Patient Instructions (Signed)
**  Stretch front of hip-lie at edge of bed-drop right leg off side slowly and keep left knee bent (to protect back).  Hold x 1 minute.  Scoot to other side of bed and repeat with left leg.  Bridge    Lying on back, legs bent 90, feet flat on floor. Press up hips and torso, reaching hands to feet. Hold for ____ breaths. ADVANCED: Clasp hands underneath back and squeeze shoulder blades together, lifting upper body onto outside of shoulders.  Copyright  VHI. All rights reserved.  Abductor Strength: Bridge Pose (Strap)    Make strap wide enough to brace knees at hip width. Press into strap with knees. Hold for ____ breaths. Repeat ____ times.Side Tilt (Sitting)    Sit on bench in bedroom. Tighten pelvic floor and hold. Roll hips slightly to one side by tilting pelvis up on opposite side, lengthen ribs. Repeat to other side. Relax. Repeat _10__ times. Do _2__ times a day. Advanced: Continue to hold squeeze through repetitions.  IT Band: Wall Lean    Stand with right hand on wall. Lean right hip toward wall with other arm supporting trunk. Hold _20__ seconds. Relax. Repeat _2__ times each leg.  Standing On One Leg Without Support .  Stand on one leg in neutral spine with one hand for support. Hold 12 seconds. Repeat on other leg. Do 2 repetitions, 2 sets per day.    Side-Stepping   Walk to left side with eyes open. Take even steps, leading with same foot. Make sure each foot lifts off the floor. Repeat in opposite direction. Repeat all the way around your island, change directions. Do 2 sessions per day.   Feet Partial Heel-Toe, Head Motion - Eyes Open    With eyes open, right foot partially in front of the other, move head slowly: up and down 10 times and then side to side 10 times Switch foot placement and repeat; hold on with one hand if needed Repeat 2 times per session. Do 2 sessions per day.

## 2016-04-30 ENCOUNTER — Encounter: Payer: Self-pay | Admitting: Physical Therapy

## 2016-04-30 ENCOUNTER — Ambulatory Visit: Payer: PPO | Admitting: Physical Therapy

## 2016-04-30 DIAGNOSIS — C50819 Malignant neoplasm of overlapping sites of unspecified female breast: Secondary | ICD-10-CM | POA: Diagnosis not present

## 2016-04-30 DIAGNOSIS — E063 Autoimmune thyroiditis: Secondary | ICD-10-CM | POA: Diagnosis not present

## 2016-04-30 DIAGNOSIS — R269 Unspecified abnormalities of gait and mobility: Secondary | ICD-10-CM

## 2016-04-30 DIAGNOSIS — R2689 Other abnormalities of gait and mobility: Secondary | ICD-10-CM

## 2016-04-30 DIAGNOSIS — M6281 Muscle weakness (generalized): Secondary | ICD-10-CM | POA: Diagnosis not present

## 2016-04-30 DIAGNOSIS — R262 Difficulty in walking, not elsewhere classified: Secondary | ICD-10-CM

## 2016-04-30 DIAGNOSIS — R7309 Other abnormal glucose: Secondary | ICD-10-CM | POA: Diagnosis not present

## 2016-04-30 DIAGNOSIS — E559 Vitamin D deficiency, unspecified: Secondary | ICD-10-CM | POA: Diagnosis not present

## 2016-04-30 DIAGNOSIS — R29898 Other symptoms and signs involving the musculoskeletal system: Secondary | ICD-10-CM

## 2016-04-30 DIAGNOSIS — R2681 Unsteadiness on feet: Secondary | ICD-10-CM

## 2016-04-30 DIAGNOSIS — G729 Myopathy, unspecified: Secondary | ICD-10-CM | POA: Diagnosis not present

## 2016-04-30 DIAGNOSIS — E039 Hypothyroidism, unspecified: Secondary | ICD-10-CM | POA: Diagnosis not present

## 2016-04-30 NOTE — Therapy (Signed)
[facility] [address] [place], [place], [address] Phone: [phone]   Fax:  [phone]  Physical Therapy Treatment  Patient Details  Name: Rachael [name] MRN: [id number] Date of Birth: 1933/02/18 Referring Provider: Lahoma Rocker, MD  Encounter Date: 04/30/2016      PT End of Session - 04/30/16 1226    Visit Number 3   Number of Visits 17   Date for PT Re-Evaluation 06/20/16   Authorization Type [employer] G Code every 10th visit   PT Start Time 0930   PT Stop Time 1015   PT Time Calculation (min) 45 min   Equipment Utilized During Treatment Gait belt   Activity Tolerance Patient tolerated treatment well   Behavior During Therapy [facility] for tasks assessed/performed      Past Medical History:  Diagnosis Date  . Breast cancer (HCC)    R  . CAROTID STENOSIS   . Chronic venous insufficiency   . CONTACT DERMATITIS   . DUPUYTREN'S CONTRACTURE   . Edema    pt  states had edema of feet and legs has been on lasix per dr. to help  . GLUCOSE INTOLERANCE   . HLD (hyperlipidemia)   . Hypothyroidism    Dr. Ala Dach in [place]   . MENOPAUSAL SYNDROME   . Obesity   . Osteoarthritis    bilat hips, severe  . PVD (peripheral vascular disease) ([place])    mild carotid 11/05  . VITAMIN D DEFICIENCY     Past Surgical History:  Procedure Laterality Date  . 2D echo  11/29/03   normal LV size. normal EF   . [place]   lumbarectomy  . BREAST BIOPSY  05/11/11   right breast  . BREAST LUMPECTOMY     right breast  . CHOLECYSTECTOMY    . FINGER SURGERY    . HERNIA REPAIR     incisional hernia  . INGUINAL HERNIA REPAIR    . JOINT REPLACEMENT Bilateral    Dr. Maureen Ralphs  . normal caronary arteries     per pt. 2003  . right rotator cuff surgery    . TONSILLECTOMY    . TOTAL HIP ARTHROPLASTY  05/2008 and 10/2008   B hip - alusio    There were no vitals filed for this visit.      Subjective Assessment  - 04/30/16 0935    Subjective Patient reports yesterday she completed 3 sets of all of her HEP exercises in addition to gardening. She reports her legs (knees down) are tired. Patient denies any pain or falls even though her legs feel tired today. She reports she feels as though she is getting stronger, and is pleased.    Pertinent History 2013 breast CA with lumpectomy, h/o bilat THA, hypothyroid, PVD   Limitations Walking;House hold activities   Patient Stated Goals Walk normally again and not use AD   Currently in Pain? No/denies                         [facility] Adult PT Treatment/Exercise - 04/30/16 0930      Ambulation/Gait   Ambulation/Gait Yes   Ambulation/Gait Assistance 5: Supervision   Ambulation Distance (Feet) 75 Feet   Assistive device Rollator   Gait Pattern Step-through pattern;Decreased stance time - right;Decreased stride length;Decreased weight shift to right;Lateral trunk lean to left;Trunk flexed;Decreased arm swing - right;Decreased arm swing - left   Ambulation Surface Level;Indoor   Stairs --  Stairs Assistance --   Stair Management Technique --   Number of Stairs --   Height of Stairs --     Neuro Re-ed    Neuro Re-ed Details  Corner exercises with chair in front of patient: narrow BOS with head turns horizontal/vertical/diagonal. Progressed to wide BOS on compliant surface with head turns horizontal/vertical/diagonal causing an increase in patient's sway, but minimal use of hands on the back of the chair. Narrow BOS on compliant surface without head turns. Patient attempted head turns with narrow base on compliant surface, but was unable to complete >2 turns without loss of balance. Narrow BOS on compliant surface with EC patient able to stand for approximately 10 seconds with increased sway. Patient requires cueing from PT to maintain upright posture, tactile cueing to help patient maintain balance during increased sway, and cueing to only turn head  (not body) during exercise.     Exercises   Exercises Knee/Hip     Lumbar Exercises: Sidelying   Hip Abduction 10 reps;3 seconds  performed bilaterally    Hip Abduction Limitations R hip fatigues faster than L hip. Patient requires cueing to maintain proper pelvic position.     Knee/Hip Exercises: Standing   Hip Extension Stengthening;Both;2 sets;10 reps;Knee straight  performed bilaterally    Extension Limitations R hip fatigues faster than L hip. Patient requires cueing to maintain posture and tactile cueing to maintain proper pelvic position during exercise.    Forward Step Up Both;2 sets;5 reps;Hand Hold: 1;Step Height: 4"  hand held assist + light touch on rollator    Forward Step Up Limitations Patient stepped onto 4 inch step and held this position with PT cueing for patient to perform a glut set in standing ("push through the heel" on the LE on the floor. Patient requires tactile and verbal cueing to maintain proper pelvic position throughout exercise. Patient's R hip fatigues faster than L hip. Patient required more assist from PT during second set due to fatigue.                 PT Education - 04/30/16 1223    Education provided Yes   Education Details scaling back number of sets performed of HEP when also gardening/cleaning to prevent over working; fall prevention and airport navigation with assistive device with patient's travel plans for [facility] (patient leaves 05/05/2016)    Person(s) Educated Patient   Methods Explanation   Comprehension Verbalized understanding          PT Short Term Goals - 04/28/16 1247      PT SHORT TERM GOAL #1   Title Will improve five time sit to stand time to 12 seconds without use of UE for improved functional LE strength (TARGET DATE FOR ALL STG 05/21/16)   Baseline 14 seconds without UE    Time 4   Period Weeks   Status New     PT SHORT TERM GOAL #2   Title Will decrease falls risk as indicated by decrease in TUG score <15  seconds without RW    Baseline 18.63 without RW   Time 4   Period Weeks   Status New     PT SHORT TERM GOAL #3   Title Will ambulate up/down 8 stairs with one rail with alternating method and Mod I   Baseline supervision with 2 rails   Time 4   Period Weeks   Status New     PT SHORT TERM GOAL #4   Title Pt will perform  gait on level, indoor surfaces without RW x 150' with supervision   Baseline Min A without RW   Time 4   Period Weeks   Status New     PT SHORT TERM GOAL #5   Title Will perform BERG to assess balance deficits and LTG to be set   Baseline Goal met 04/28/16; LTG set   Status Achieved           PT Long Term Goals - 04/28/16 1247      PT LONG TERM GOAL #1   Title Will improve hip and knee flexion strength to 5/5 bilaterally and will perform 5 times sit to stand in <12 seconds without UE support (TARGET DATE FOR ALL LTG 06/20/16)   Time 8   Period Weeks   Status New     PT LONG TERM GOAL #2   Title Pt will improve safety with gait as indicated by increase in 10 meter walk test/gait velocity to >2.62 ft/sec without AD   Baseline 2.12 ft/sec   Time 8   Period Weeks   Status New     PT LONG TERM GOAL #3   Title Will decrease falls risk as indicated by a TUG score of <13 seconds without RW    Time 8   Period Weeks   Status New     PT LONG TERM GOAL #4   Title Will perform HEP independently   Time 8   Period Weeks   Status New     PT LONG TERM GOAL #5   Title Will ambulate over uneven outdoor surfaces x 500 ft. and up/down 8 stairs with SPC mod I    Time 8   Period Weeks   Status New     PT LONG TERM GOAL #6   Title Pt will decrease falls risk and will improve BERG balance score to >50/56 overall   Baseline 45/56   Time 8   Period Weeks   Status Revised               Plan - 04/30/16 1226    Clinical Impression Statement Patient reported to PT today reporting her legs felt tired from gardening and perfroming 3 sets of HEP yesterday.  PT educated patient on scaling back her HEP when performing other taxing household activities (cleaning and gardening). Patient verbalized understanding. PT focused today's session on strengthening and balance. Patient requires cueing to maintain proper form/position during exercises. Patient's R hip continues to fatigue quickly during activities causing her to require additional assistance from PT. Patient is progressing towards goals, and will continue to benefit from skilled PT to address deficits.    Rehab Potential Good   Clinical Impairments Affecting Rehab Potential 2 year history of LE weakness and steroid use   PT Frequency 2x / week   PT Duration 8 weeks   PT Treatment/Interventions ADLs/Self Care Home Management;Gait training;Stair training;Functional mobility training;DME Instruction;Therapeutic activities;Therapeutic exercise;Balance training;Neuromuscular re-education;Patient/family education;Passive range of motion;Taping   PT Next Visit Plan endurance, balance exercises specifically incorporating more with compliant surfaces and eyes closed, check HEP and scale as appropriate    Consulted and Agree with Plan of Care Patient      Patient will benefit from skilled therapeutic intervention in order to improve the following deficits and impairments:  Abnormal gait, Decreased balance, Decreased strength, Difficulty walking, Decreased endurance  Visit Diagnosis: Muscle weakness (generalized)  Difficulty in walking, not elsewhere classified  Unsteadiness on feet  Other abnormalities of gait and  mobility  Weakness of both legs  Unstable balance  Gait abnormality     Problem List Patient Active Problem List   Diagnosis Date Noted  . SIRS (systemic inflammatory response syndrome) (HCC)   . Weakness of both lower extremities   . Arthritis   . Sepsis secondary to UTI ([place]) 03/18/2015  . Gout attack 10/23/2014  . Toe effusion 05/24/2014  . Anemia in neoplastic disease  04/02/2014  . CKD (chronic kidney disease), stage III 04/02/2014  . Wound infection 03/18/2014  . Cellulitis 03/18/2014  . Acute renal failure ([place]) 03/18/2014  . Leg edema, right   . Open wound of leg 02/22/2014  . Breast cancer, right breast ([place]) 05/13/2011  . Incisional hernia, incarcerated, right upper quadrant 07/13/2010  . HEADACHE 11/06/2009  . CONTACT DERMATITIS 06/13/2009  . FATIGUE 06/13/2009  . EDEMA 05/01/2008  . BACK PAIN 03/29/2008  . CAROTID STENOSIS 03/16/2008  . ANXIETY 11/14/2007  . MENOPAUSAL SYNDROME 09/27/2007  . GLUCOSE INTOLERANCE 08/10/2007  . HYPERLIPIDEMIA 08/10/2007  . PERIPHERAL VASCULAR DISEASE 08/10/2007  . Venous (peripheral) insufficiency 08/10/2007  . Vitamin D deficiency 12/26/2006  . OBESITY 12/26/2006  . DUPUYTREN'S CONTRACTURE 12/26/2006  . Hypothyroidism 11/05/2006  . OSTEOARTHRITIS 11/05/2006    Arelia Sneddon, SPT 04/30/2016, 12:35 PM  [facility] [address] [place], [place], [address] Phone: [phone]   Fax:  [phone]  Name: Rachael [name] MRN: [id number] Date of Birth: December 29, 1933

## 2016-05-03 ENCOUNTER — Encounter: Payer: Self-pay | Admitting: Physical Therapy

## 2016-05-03 ENCOUNTER — Ambulatory Visit: Payer: PPO | Admitting: Physical Therapy

## 2016-05-03 DIAGNOSIS — M6281 Muscle weakness (generalized): Secondary | ICD-10-CM

## 2016-05-03 DIAGNOSIS — R2681 Unsteadiness on feet: Secondary | ICD-10-CM

## 2016-05-03 DIAGNOSIS — R2689 Other abnormalities of gait and mobility: Secondary | ICD-10-CM

## 2016-05-03 DIAGNOSIS — R262 Difficulty in walking, not elsewhere classified: Secondary | ICD-10-CM

## 2016-05-03 NOTE — Patient Instructions (Signed)
Walking on Toes    Walk on toes length of countertop forwards and then backwards while continuing on a straight path.  Do _4___ trips.  Copyright  VHI. All rights reserved.  FUNCTIONAL MOBILITY: Heel Walking    Walk forward and backwards on heels length of countertop. _4__ reps per set  Copyright  VHI. All rights reserved.

## 2016-05-03 NOTE — Therapy (Signed)
[facility] [address] [place], [place], [address] Phone: [phone]   Fax:  [phone]  Physical Therapy Treatment  Patient Details  Name: Rachael [name] MRN: [id number] Date of Birth: 08-Mar-1933 Referring Provider: Lahoma Rocker, MD  Encounter Date: 05/03/2016      PT End of Session - 05/03/16 1608    Visit Number 4   Number of Visits 17   Date for PT Re-Evaluation 06/20/16   Authorization Type [employer] G Code every 10th visit   PT Start Time 1028   PT Stop Time 1108   PT Time Calculation (min) 40 min   Activity Tolerance Patient tolerated treatment well   Behavior During Therapy [facility] for tasks assessed/performed      Past Medical History:  Diagnosis Date  . Breast cancer (HCC)    R  . CAROTID STENOSIS   . Chronic venous insufficiency   . CONTACT DERMATITIS   . DUPUYTREN'S CONTRACTURE   . Edema    pt  states had edema of feet and legs has been on lasix per dr. to help  . GLUCOSE INTOLERANCE   . HLD (hyperlipidemia)   . Hypothyroidism    Dr. Ala Dach in [place]   . MENOPAUSAL SYNDROME   . Obesity   . Osteoarthritis    bilat hips, severe  . PVD (peripheral vascular disease) ([place])    mild carotid 11/05  . VITAMIN D DEFICIENCY     Past Surgical History:  Procedure Laterality Date  . 2D echo  11/29/03   normal LV size. normal EF   . [place]   lumbarectomy  . BREAST BIOPSY  05/11/11   right breast  . BREAST LUMPECTOMY     right breast  . CHOLECYSTECTOMY    . FINGER SURGERY    . HERNIA REPAIR     incisional hernia  . INGUINAL HERNIA REPAIR    . JOINT REPLACEMENT Bilateral    Dr. Maureen Ralphs  . normal caronary arteries     per pt. 2003  . right rotator cuff surgery    . TONSILLECTOMY    . TOTAL HIP ARTHROPLASTY  05/2008 and 10/2008   B hip - alusio    There were no vitals filed for this visit.      Subjective Assessment - 05/03/16 1033    Subjective Pt continues to  feel stronger and stronger in her LE; walking with cane today and reports walking around her house without any AD.  Also reports doing yard work this weekend with cane and shovel!  Preparing for trip to [facility] on Wednesday.     Pertinent History 2013 breast CA with lumpectomy, h/o bilat THA, hypothyroid, PVD   Limitations Walking;House hold activities   Patient Stated Goals Walk normally again and not use AD   Currently in Pain? No/denies                         [facility] Adult PT Treatment/Exercise - 05/03/16 1104      Self-Care   Self-Care Other Self-Care Comments  self massage on R piriformis with tennis ball in sitting             Balance Exercises - 05/03/16 1053      Balance Exercises: Standing   SLS with Vectors Foam/compliant surface;5 reps  see overall comments   Retro Gait 4 reps;Upper extremity support   Marching Limitations High knee marching forwards x 4 reps with  intermittent UE support on countertop   Other Standing Exercises SLS with vectors standing on compliant surface performing retro gait and lateral stepping to L and R with alternating LE taps to balance bubbles forwards (heel-toe) and to side     OTAGO PROGRAM   Heel Walking Support   Toe Walk Support           PT Education - 05/03/16 1607    Education provided Yes   Education Details added piriformis massage with tennis ball and heel and toe walking to HEP   Person(s) Educated Patient   Methods Explanation;Handout   Comprehension Verbalized understanding;Returned demonstration          PT Short Term Goals - 04/28/16 1247      PT SHORT TERM GOAL #1   Title Will improve five time sit to stand time to 12 seconds without use of UE for improved functional LE strength (TARGET DATE FOR ALL STG 05/21/16)   Baseline 14 seconds without UE    Time 4   Period Weeks   Status New     PT SHORT TERM GOAL #2   Title Will decrease falls risk as indicated by decrease in TUG score <15 seconds  without RW    Baseline 18.63 without RW   Time 4   Period Weeks   Status New     PT SHORT TERM GOAL #3   Title Will ambulate up/down 8 stairs with one rail with alternating method and Mod I   Baseline supervision with 2 rails   Time 4   Period Weeks   Status New     PT SHORT TERM GOAL #4   Title Pt will perform gait on level, indoor surfaces without RW x 150' with supervision   Baseline Min A without RW   Time 4   Period Weeks   Status New     PT SHORT TERM GOAL #5   Title Will perform BERG to assess balance deficits and LTG to be set   Baseline Goal met 04/28/16; LTG set   Status Achieved           PT Long Term Goals - 04/28/16 1247      PT LONG TERM GOAL #1   Title Will improve hip and knee flexion strength to 5/5 bilaterally and will perform 5 times sit to stand in <12 seconds without UE support (TARGET DATE FOR ALL LTG 06/20/16)   Time 8   Period Weeks   Status New     PT LONG TERM GOAL #2   Title Pt will improve safety with gait as indicated by increase in 10 meter walk test/gait velocity to >2.62 ft/sec without AD   Baseline 2.12 ft/sec   Time 8   Period Weeks   Status New     PT LONG TERM GOAL #3   Title Will decrease falls risk as indicated by a TUG score of <13 seconds without RW    Time 8   Period Weeks   Status New     PT LONG TERM GOAL #4   Title Will perform HEP independently   Time 8   Period Weeks   Status New     PT LONG TERM GOAL #5   Title Will ambulate over uneven outdoor surfaces x 500 ft. and up/down 8 stairs with SPC mod I    Time 8   Period Weeks   Status New     PT LONG TERM GOAL #6   Title  Pt will decrease falls risk and will improve BERG balance score to >50/56 overall   Baseline 45/56   Time 8   Period Weeks   Status Revised               Plan - 05/03/16 1608    Clinical Impression Statement Pt continues to improve in her LE strength and balance and was able to tolerate greater balance challenges today with  decreased UE support while on compliant surface and during single limb stance; continued to require cues for closed chain hip ABD activation and pt continued to report pain in posterior region of hip.  Provided pt with self massage of R piriformis mm with tennis ball in sitting.  Also educated pt on stretches she could perform safely after hip replacement (sitting with LE ER and heel crossed over opposite knee, pressing knee down to decrease hip flexion); pt does have sufficient range currently to perform stretch.  Will continue to address   Rehab Potential Good   Clinical Impairments Affecting Rehab Potential 2 year history of LE weakness and steroid use   PT Treatment/Interventions ADLs/Self Care Home Management;Gait training;Stair training;Functional mobility training;DME Instruction;Therapeutic activities;Therapeutic exercise;Balance training;Neuromuscular re-education;Patient/family education;Passive range of motion;Taping   PT Next Visit Plan Going to [facility] on Wednesday; practice gait outside if not raining or cold?  SLS and balance exercises specifically incorporating compliant surfaces and eyes closed, check HEP and scale as appropriate    Consulted and Agree with Plan of Care Patient      Patient will benefit from skilled therapeutic intervention in order to improve the following deficits and impairments:  Abnormal gait, Decreased balance, Decreased strength, Difficulty walking, Decreased endurance  Visit Diagnosis: Muscle weakness (generalized)  Difficulty in walking, not elsewhere classified  Unsteadiness on feet  Other abnormalities of gait and mobility     Problem List Patient Active Problem List   Diagnosis Date Noted  . SIRS (systemic inflammatory response syndrome) (HCC)   . Weakness of both lower extremities   . Arthritis   . Sepsis secondary to UTI ([place]) 03/18/2015  . Gout attack 10/23/2014  . Toe effusion 05/24/2014  . Anemia in neoplastic disease 04/02/2014   . CKD (chronic kidney disease), stage III 04/02/2014  . Wound infection 03/18/2014  . Cellulitis 03/18/2014  . Acute renal failure ([place]) 03/18/2014  . Leg edema, right   . Open wound of leg 02/22/2014  . Breast cancer, right breast ([place]) 05/13/2011  . Incisional hernia, incarcerated, right upper quadrant 07/13/2010  . HEADACHE 11/06/2009  . CONTACT DERMATITIS 06/13/2009  . FATIGUE 06/13/2009  . EDEMA 05/01/2008  . BACK PAIN 03/29/2008  . CAROTID STENOSIS 03/16/2008  . ANXIETY 11/14/2007  . MENOPAUSAL SYNDROME 09/27/2007  . GLUCOSE INTOLERANCE 08/10/2007  . HYPERLIPIDEMIA 08/10/2007  . PERIPHERAL VASCULAR DISEASE 08/10/2007  . Venous (peripheral) insufficiency 08/10/2007  . Vitamin D deficiency 12/26/2006  . OBESITY 12/26/2006  . DUPUYTREN'S CONTRACTURE 12/26/2006  . Hypothyroidism 11/05/2006  . OSTEOARTHRITIS 11/05/2006   Raylene Everts, PT, DPT 05/03/16    4:15 PM    [facility] [address] [place], [place], [address] Phone: [phone]   Fax:  [phone]  Name: Rachael [name] MRN: [id number] Date of Birth: Nov 04, 1933

## 2016-05-04 ENCOUNTER — Ambulatory Visit: Payer: PPO | Admitting: Physical Therapy

## 2016-05-04 ENCOUNTER — Encounter: Payer: Self-pay | Admitting: Physical Therapy

## 2016-05-04 DIAGNOSIS — R262 Difficulty in walking, not elsewhere classified: Secondary | ICD-10-CM

## 2016-05-04 DIAGNOSIS — M6281 Muscle weakness (generalized): Secondary | ICD-10-CM | POA: Diagnosis not present

## 2016-05-04 DIAGNOSIS — R2689 Other abnormalities of gait and mobility: Secondary | ICD-10-CM

## 2016-05-04 DIAGNOSIS — R2681 Unsteadiness on feet: Secondary | ICD-10-CM

## 2016-05-04 NOTE — Therapy (Signed)
[facility] [address] [place], [place], [address] Phone: [phone]   Fax:  [phone]  Physical Therapy Treatment  Patient Details  Name: Rachael [name] MRN: [id number] Date of Birth: 11-06-1933 Referring Provider: Lahoma Rocker, MD  Encounter Date: 05/04/2016      PT End of Session - 05/04/16 1238    Visit Number 5   Number of Visits 17   Date for PT Re-Evaluation 06/20/16   Authorization Type [employer] G Code every 10th visit   PT Start Time 1233   PT Stop Time 1315   PT Time Calculation (min) 42 min   Equipment Utilized During Treatment Gait belt   Activity Tolerance Patient tolerated treatment well   Behavior During Therapy [facility] for tasks assessed/performed      Past Medical History:  Diagnosis Date  . Breast cancer (HCC)    R  . CAROTID STENOSIS   . Chronic venous insufficiency   . CONTACT DERMATITIS   . DUPUYTREN'S CONTRACTURE   . Edema    pt  states had edema of feet and legs has been on lasix per dr. to help  . GLUCOSE INTOLERANCE   . HLD (hyperlipidemia)   . Hypothyroidism    Dr. Ala Dach in [place]   . MENOPAUSAL SYNDROME   . Obesity   . Osteoarthritis    bilat hips, severe  . PVD (peripheral vascular disease) ([place])    mild carotid 11/05  . VITAMIN D DEFICIENCY     Past Surgical History:  Procedure Laterality Date  . 2D echo  11/29/03   normal LV size. normal EF   . [place]   lumbarectomy  . BREAST BIOPSY  05/11/11   right breast  . BREAST LUMPECTOMY     right breast  . CHOLECYSTECTOMY    . FINGER SURGERY    . HERNIA REPAIR     incisional hernia  . INGUINAL HERNIA REPAIR    . JOINT REPLACEMENT Bilateral    Dr. Maureen Ralphs  . normal caronary arteries     per pt. 2003  . right rotator cuff surgery    . TONSILLECTOMY    . TOTAL HIP ARTHROPLASTY  05/2008 and 10/2008   B hip - alusio    There were no vitals filed for this visit.      Subjective Assessment  - 05/04/16 1237    Subjective No new complaints. No falls or pain to report. Does report sorenss from yesterday's session in her right hip. Feels the tennis ball rolling helped with pain and tightness.    Pertinent History 2013 breast CA with lumpectomy, h/o bilat THA, hypothyroid, PVD   Limitations Walking;House hold activities   Patient Stated Goals Walk normally again and not use AD   Currently in Pain? No/denies           [facility] Adult PT Treatment/Exercise - 05/04/16 1240      Transfers   Transfers Sit to Stand;Stand to Sit   Sit to Stand 5: Supervision;With upper extremity assist;From chair/3-in-1;From bed   Stand to Sit 5: Supervision;With upper extremity assist;To bed;To chair/3-in-1     Ambulation/Gait   Ambulation/Gait Yes   Ambulation/Gait Assistance 4: Min guard;5: Supervision   Ambulation/Gait Assistance Details gait on simulated outdoor surfaces combined with indoor surfaces: floor<>red mats with bean bags underneath<>floor<>blue mat<>floor<>red mats with bean bags underneath<>floor with ramp/curb included as well. 1st rep with rollator, second rep with cane. pt needed increased assistance for balance with  use of cane vs rollator on complaint surfaces and barriers.                               Ambulation Distance (Feet) 130 Feet  x2   Assistive device Rollator;Straight cane   Gait Pattern Step-through pattern;Decreased stance time - right;Decreased stride length;Decreased weight shift to right;Lateral trunk lean to left;Trunk flexed;Decreased arm swing - right;Decreased arm swing - left   Ambulation Surface Level;Indoor   Ramp 5: Supervision;Other (comment)  min guard with use of cane   Ramp Details (indicate cue type and reason) performed x 1 rep each with rollator, cane. increased assistance for balance needed with cane   Curb Other (comment);4: Min assist  min guard assist with rollator, min assist with cane   Curb Details (indicate cue type and reason) x 1 rep each  with rollator and cane. increased assistance needed with descending curb with cane for balance     High Level Balance   High Level Balance Activities Side stepping;Tandem walking;Marching forwards;Marching backwards  tandem/toe/heel walking fwd/bwd   High Level Balance Comments red mats next to counter top with single UE support on counter top: 3 laps each way with min guard to min assist for balance, cues on posture, ex form and technique           Balance Exercises - 05/04/16 1308      Balance Exercises: Standing   SLS with Vectors Foam/compliant surface;Other reps (comment);Limitations     Balance Exercises: Standing   SLS with Vectors Limitations 6 cones along edges of both red mats: alternating toe taps to each cone with side stepping left<>right x 1 lap each way with min assist. cues on posture, stance position and weight shifting for balance.              PT Short Term Goals - 04/28/16 1247      PT SHORT TERM GOAL #1   Title Will improve five time sit to stand time to 12 seconds without use of UE for improved functional LE strength (TARGET DATE FOR ALL STG 05/21/16)   Baseline 14 seconds without UE    Time 4   Period Weeks   Status New     PT SHORT TERM GOAL #2   Title Will decrease falls risk as indicated by decrease in TUG score <15 seconds without RW    Baseline 18.63 without RW   Time 4   Period Weeks   Status New     PT SHORT TERM GOAL #3   Title Will ambulate up/down 8 stairs with one rail with alternating method and Mod I   Baseline supervision with 2 rails   Time 4   Period Weeks   Status New     PT SHORT TERM GOAL #4   Title Pt will perform gait on level, indoor surfaces without RW x 150' with supervision   Baseline Min A without RW   Time 4   Period Weeks   Status New     PT SHORT TERM GOAL #5   Title Will perform BERG to assess balance deficits and LTG to be set   Baseline Goal met 04/28/16; LTG set   Status Achieved           PT  Long Term Goals - 04/28/16 1247      PT LONG TERM GOAL #1   Title Will improve hip and knee flexion strength to  5/5 bilaterally and will perform 5 times sit to stand in <12 seconds without UE support (TARGET DATE FOR ALL LTG 06/20/16)   Time 8   Period Weeks   Status New     PT LONG TERM GOAL #2   Title Pt will improve safety with gait as indicated by increase in 10 meter walk test/gait velocity to >2.62 ft/sec without AD   Baseline 2.12 ft/sec   Time 8   Period Weeks   Status New     PT LONG TERM GOAL #3   Title Will decrease falls risk as indicated by a TUG score of <13 seconds without RW    Time 8   Period Weeks   Status New     PT LONG TERM GOAL #4   Title Will perform HEP independently   Time 8   Period Weeks   Status New     PT LONG TERM GOAL #5   Title Will ambulate over uneven outdoor surfaces x 500 ft. and up/down 8 stairs with SPC mod I    Time 8   Period Weeks   Status New     PT LONG TERM GOAL #6   Title Pt will decrease falls risk and will improve BERG balance score to >50/56 overall   Baseline 45/56   Time 8   Period Weeks   Status Revised           Plan - 05/04/16 1239    Clinical Impression Statement Today's skilled session addressed gait on complaint surfaces (outdoor surfaces simulated in gym) in preparation for pt's upcoming trip to [place]. Pt with increased balance with use of rollator vs cane. Pt states she primarily uses her rollator with travels and the cane only in small places or when she is with some one who can help her steady as needed. Remainder of session addressed high level balance with no issues reported. No increase in soreness reported in session today. Pt is making steady progress toward goals and should benefit from continued PT to progress toward unmet goals.    Rehab Potential Good   Clinical Impairments Affecting Rehab Potential 2 year history of LE weakness and steroid use   PT Treatment/Interventions ADLs/Self Care Home  Management;Gait training;Stair training;Functional mobility training;DME Instruction;Therapeutic activities;Therapeutic exercise;Balance training;Neuromuscular re-education;Patient/family education;Passive range of motion;Taping   PT Next Visit Plan  SLS and balance exercises specifically incorporating compliant surfaces and eyes closed, check HEP and scale as appropriate    Consulted and Agree with Plan of Care Patient      Patient will benefit from skilled therapeutic intervention in order to improve the following deficits and impairments:  Abnormal gait, Decreased balance, Decreased strength, Difficulty walking, Decreased endurance  Visit Diagnosis: Muscle weakness (generalized)  Difficulty in walking, not elsewhere classified  Unsteadiness on feet  Other abnormalities of gait and mobility     Problem List Patient Active Problem List   Diagnosis Date Noted  . SIRS (systemic inflammatory response syndrome) (HCC)   . Weakness of both lower extremities   . Arthritis   . Sepsis secondary to UTI ([place]) 03/18/2015  . Gout attack 10/23/2014  . Toe effusion 05/24/2014  . Anemia in neoplastic disease 04/02/2014  . CKD (chronic kidney disease), stage III 04/02/2014  . Wound infection 03/18/2014  . Cellulitis 03/18/2014  . Acute renal failure ([place]) 03/18/2014  . Leg edema, right   . Open wound of leg 02/22/2014  . Breast cancer, right breast ([place]) 05/13/2011  . Incisional hernia,  incarcerated, right upper quadrant 07/13/2010  . HEADACHE 11/06/2009  . CONTACT DERMATITIS 06/13/2009  . FATIGUE 06/13/2009  . EDEMA 05/01/2008  . BACK PAIN 03/29/2008  . CAROTID STENOSIS 03/16/2008  . ANXIETY 11/14/2007  . MENOPAUSAL SYNDROME 09/27/2007  . GLUCOSE INTOLERANCE 08/10/2007  . HYPERLIPIDEMIA 08/10/2007  . PERIPHERAL VASCULAR DISEASE 08/10/2007  . Venous (peripheral) insufficiency 08/10/2007  . Vitamin D deficiency 12/26/2006  . OBESITY 12/26/2006  . DUPUYTREN'S CONTRACTURE 12/26/2006   . Hypothyroidism 11/05/2006  . OSTEOARTHRITIS 11/05/2006    Willow Ora, PTA, [facility] [address], [place], [place] [address] [phone] 05/04/16, 9:58 PM   Name: Rachael [name] MRN: [id number] Date of Birth: 01/24/1933

## 2016-05-10 ENCOUNTER — Ambulatory Visit: Payer: PPO | Admitting: Physical Therapy

## 2016-05-10 ENCOUNTER — Encounter: Payer: Self-pay | Admitting: Physical Therapy

## 2016-05-10 DIAGNOSIS — M6281 Muscle weakness (generalized): Secondary | ICD-10-CM | POA: Diagnosis not present

## 2016-05-10 DIAGNOSIS — R2689 Other abnormalities of gait and mobility: Secondary | ICD-10-CM

## 2016-05-10 DIAGNOSIS — R2681 Unsteadiness on feet: Secondary | ICD-10-CM

## 2016-05-10 DIAGNOSIS — R262 Difficulty in walking, not elsewhere classified: Secondary | ICD-10-CM

## 2016-05-10 NOTE — Therapy (Signed)
[facility] [address] [place], [place], [address] Phone: [phone]   Fax:  [phone]  Physical Therapy Treatment  Patient Details  Name: Rachael [name] MRN: [id number] Date of Birth: 1933-09-19 Referring Provider: Lahoma Rocker, MD  Encounter Date: 05/10/2016      PT End of Session - 05/10/16 1354    Visit Number 6   Number of Visits 17   Date for PT Re-Evaluation 06/20/16   Authorization Type [employer] G Code every 10th visit   PT Start Time 1315   PT Stop Time 1354   PT Time Calculation (min) 39 min   Activity Tolerance Patient tolerated treatment well   Behavior During Therapy [facility] for tasks assessed/performed      Past Medical History:  Diagnosis Date  . Breast cancer (HCC)    R  . CAROTID STENOSIS   . Chronic venous insufficiency   . CONTACT DERMATITIS   . DUPUYTREN'S CONTRACTURE   . Edema    pt  states had edema of feet and legs has been on lasix per dr. to help  . GLUCOSE INTOLERANCE   . HLD (hyperlipidemia)   . Hypothyroidism    Dr. Ala Dach in [place]   . MENOPAUSAL SYNDROME   . Obesity   . Osteoarthritis    bilat hips, severe  . PVD (peripheral vascular disease) ([place])    mild carotid 11/05  . VITAMIN D DEFICIENCY     Past Surgical History:  Procedure Laterality Date  . 2D echo  11/29/03   normal LV size. normal EF   . [place]   lumbarectomy  . BREAST BIOPSY  05/11/11   right breast  . BREAST LUMPECTOMY     right breast  . CHOLECYSTECTOMY    . FINGER SURGERY    . HERNIA REPAIR     incisional hernia  . INGUINAL HERNIA REPAIR    . JOINT REPLACEMENT Bilateral    Dr. Maureen Ralphs  . normal caronary arteries     per pt. 2003  . right rotator cuff surgery    . TONSILLECTOMY    . TOTAL HIP ARTHROPLASTY  05/2008 and 10/2008   B hip - alusio    There were no vitals filed for this visit.      Subjective Assessment - 05/10/16 1319    Subjective Pt returned from  [place]; had one fall off of bench while in [place] with no injury except a few skin tears.  Pt reports that pain in her hip totally resolved after the fall-reports it doesn't even hurt when she is sitting on it.     Pertinent History 2013 breast CA with lumpectomy, h/o bilat THA, hypothyroid, PVD   Limitations Walking;House hold activities   Patient Stated Goals Walk normally again and not use AD   Currently in Pain? No/denies                         [facility] Adult PT Treatment/Exercise - 05/10/16 1321      Lumbar Exercises: Supine   Bridge Non-compliant;10 reps   Bridge Limitations 2 sets with R and L lateral step outs     Knee/Hip Exercises: Standing   Lateral Step Up Right;Left;2 sets;10 reps;Hand Hold: 1;Step Height: 4"   Lateral Step Up Limitations with contralateral LE high knee march/SLS   Forward Step Up Right;Left;2 sets;10 reps;Hand Hold: 2;Step Height: 4"   Forward Step Up Limitations step up with contralateral  LE high knee march/SLS     Knee/Hip Exercises: Sidelying   Hip ABduction Strengthening;Right;Left;2 sets;Other (comment)   Hip ABduction Limitations 12 reps each open chain hip ABD, ABD with hip flex<>extension, ABD with posterior circles.  Required assistance to maintain ABD on RLE                PT Education - 05/10/16 1605    Education provided Yes   Education Details added hip ABD, step outs to bridging    Person(s) Educated Patient   Methods Explanation;Demonstration   Comprehension Verbalized understanding          PT Short Term Goals - 04/28/16 1247      PT SHORT TERM GOAL #1   Title Will improve five time sit to stand time to 12 seconds without use of UE for improved functional LE strength (TARGET DATE FOR ALL STG 05/21/16)   Baseline 14 seconds without UE    Time 4   Period Weeks   Status New     PT SHORT TERM GOAL #2   Title Will decrease falls risk as indicated by decrease in TUG score <15 seconds without RW    Baseline 18.63  without RW   Time 4   Period Weeks   Status New     PT SHORT TERM GOAL #3   Title Will ambulate up/down 8 stairs with one rail with alternating method and Mod I   Baseline supervision with 2 rails   Time 4   Period Weeks   Status New     PT SHORT TERM GOAL #4   Title Pt will perform gait on level, indoor surfaces without RW x 150' with supervision   Baseline Min A without RW   Time 4   Period Weeks   Status New     PT SHORT TERM GOAL #5   Title Will perform BERG to assess balance deficits and LTG to be set   Baseline Goal met 04/28/16; LTG set   Status Achieved           PT Long Term Goals - 04/28/16 1247      PT LONG TERM GOAL #1   Title Will improve hip and knee flexion strength to 5/5 bilaterally and will perform 5 times sit to stand in <12 seconds without UE support (TARGET DATE FOR ALL LTG 06/20/16)   Time 8   Period Weeks   Status New     PT LONG TERM GOAL #2   Title Pt will improve safety with gait as indicated by increase in 10 meter walk test/gait velocity to >2.62 ft/sec without AD   Baseline 2.12 ft/sec   Time 8   Period Weeks   Status New     PT LONG TERM GOAL #3   Title Will decrease falls risk as indicated by a TUG score of <13 seconds without RW    Time 8   Period Weeks   Status New     PT LONG TERM GOAL #4   Title Will perform HEP independently   Time 8   Period Weeks   Status New     PT LONG TERM GOAL #5   Title Will ambulate over uneven outdoor surfaces x 500 ft. and up/down 8 stairs with SPC mod I    Time 8   Period Weeks   Status New     PT LONG TERM GOAL #6   Title Pt will decrease falls risk and will improve BERG balance  score to >50/56 overall   Baseline 45/56   Time 8   Period Weeks   Status Revised               Plan - 05/10/16 1607    Clinical Impression Statement Pt reporting significant improvement in R hip pain after fall in [place]-no pain when sitting, standing or lying on R side.  Continued lateral and posterior  hip strengthening in sidelying and supine with no reports of hip pain.  Transitioned to step ups to incorporate weight shifting, SLS, and balance into more dynamic LE strengthening.  Pt tolerated well with no reports of pain.     Rehab Potential Good   Clinical Impairments Affecting Rehab Potential 2 year history of LE weakness and steroid use   PT Treatment/Interventions ADLs/Self Care Home Management;Gait training;Stair training;Functional mobility training;DME Instruction;Therapeutic activities;Therapeutic exercise;Balance training;Neuromuscular re-education;Patient/family education;Passive range of motion;Taping   PT Next Visit Plan  SLS and balance exercises specifically incorporating compliant surfaces and eyes closed, check HEP and scale as appropriate, focus on glute strengthening, gait without AD   Consulted and Agree with Plan of Care Patient      Patient will benefit from skilled therapeutic intervention in order to improve the following deficits and impairments:  Abnormal gait, Decreased balance, Decreased strength, Difficulty walking, Decreased endurance  Visit Diagnosis: Muscle weakness (generalized)  Difficulty in walking, not elsewhere classified  Unsteadiness on feet  Other abnormalities of gait and mobility     Problem List Patient Active Problem List   Diagnosis Date Noted  . SIRS (systemic inflammatory response syndrome) (HCC)   . Weakness of both lower extremities   . Arthritis   . Sepsis secondary to UTI ([place]) 03/18/2015  . Gout attack 10/23/2014  . Toe effusion 05/24/2014  . Anemia in neoplastic disease 04/02/2014  . CKD (chronic kidney disease), stage III 04/02/2014  . Wound infection 03/18/2014  . Cellulitis 03/18/2014  . Acute renal failure ([place]) 03/18/2014  . Leg edema, right   . Open wound of leg 02/22/2014  . Breast cancer, right breast ([place]) 05/13/2011  . Incisional hernia, incarcerated, right upper quadrant 07/13/2010  . HEADACHE 11/06/2009  .  CONTACT DERMATITIS 06/13/2009  . FATIGUE 06/13/2009  . EDEMA 05/01/2008  . BACK PAIN 03/29/2008  . CAROTID STENOSIS 03/16/2008  . ANXIETY 11/14/2007  . MENOPAUSAL SYNDROME 09/27/2007  . GLUCOSE INTOLERANCE 08/10/2007  . HYPERLIPIDEMIA 08/10/2007  . PERIPHERAL VASCULAR DISEASE 08/10/2007  . Venous (peripheral) insufficiency 08/10/2007  . Vitamin D deficiency 12/26/2006  . OBESITY 12/26/2006  . DUPUYTREN'S CONTRACTURE 12/26/2006  . Hypothyroidism 11/05/2006  . OSTEOARTHRITIS 11/05/2006   Raylene Everts, PT, DPT 05/10/16    4:09 PM    [facility] [address] [place], [place], [address] Phone: [phone]   Fax:  [phone]  Name: DEEDEE LYBARGER MRN: [id number] Date of Birth: [birth year]

## 2016-05-11 DIAGNOSIS — M353 Polymyalgia rheumatica: Secondary | ICD-10-CM | POA: Diagnosis not present

## 2016-05-11 DIAGNOSIS — G609 Hereditary and idiopathic neuropathy, unspecified: Secondary | ICD-10-CM | POA: Diagnosis not present

## 2016-05-11 DIAGNOSIS — R29898 Other symptoms and signs involving the musculoskeletal system: Secondary | ICD-10-CM | POA: Diagnosis not present

## 2016-05-13 ENCOUNTER — Encounter: Payer: Self-pay | Admitting: Physical Therapy

## 2016-05-13 ENCOUNTER — Ambulatory Visit: Payer: PPO | Attending: Rheumatology | Admitting: Physical Therapy

## 2016-05-13 DIAGNOSIS — R2689 Other abnormalities of gait and mobility: Secondary | ICD-10-CM | POA: Insufficient documentation

## 2016-05-13 DIAGNOSIS — R2681 Unsteadiness on feet: Secondary | ICD-10-CM

## 2016-05-13 DIAGNOSIS — M6281 Muscle weakness (generalized): Secondary | ICD-10-CM | POA: Insufficient documentation

## 2016-05-13 DIAGNOSIS — R262 Difficulty in walking, not elsewhere classified: Secondary | ICD-10-CM | POA: Insufficient documentation

## 2016-05-13 NOTE — Therapy (Signed)
[facility] [address] [place], [place], [address] Phone: [phone]   Fax:  [phone]  Physical Therapy Treatment  Patient Details  Name: Rachael [name] MRN: [id number] Date of Birth: 1933-12-10 Referring Provider: Lahoma Rocker, MD  Encounter Date: 05/13/2016      PT End of Session - 05/13/16 1307    Visit Number 7   Number of Visits 17   Date for PT Re-Evaluation 06/20/16   Authorization Type [employer] G Code every 10th visit   PT Start Time 0936   PT Stop Time 1016   PT Time Calculation (min) 40 min   Activity Tolerance Patient tolerated treatment well   Behavior During Therapy [facility] for tasks assessed/performed      Past Medical History:  Diagnosis Date  . Breast cancer (HCC)    R  . CAROTID STENOSIS   . Chronic venous insufficiency   . CONTACT DERMATITIS   . DUPUYTREN'S CONTRACTURE   . Edema    pt  states had edema of feet and legs has been on lasix per dr. to help  . GLUCOSE INTOLERANCE   . HLD (hyperlipidemia)   . Hypothyroidism    Dr. Ala Dach in [place]   . MENOPAUSAL SYNDROME   . Obesity   . Osteoarthritis    bilat hips, severe  . PVD (peripheral vascular disease) ([place])    mild carotid 11/05  . VITAMIN D DEFICIENCY     Past Surgical History:  Procedure Laterality Date  . 2D echo  11/29/03   normal LV size. normal EF   . [place]   lumbarectomy  . BREAST BIOPSY  05/11/11   right breast  . BREAST LUMPECTOMY     right breast  . CHOLECYSTECTOMY    . FINGER SURGERY    . HERNIA REPAIR     incisional hernia  . INGUINAL HERNIA REPAIR    . JOINT REPLACEMENT Bilateral    Dr. Maureen Ralphs  . normal caronary arteries     per pt. 2003  . right rotator cuff surgery    . TONSILLECTOMY    . TOTAL HIP ARTHROPLASTY  05/2008 and 10/2008   B hip - alusio    There were no vitals filed for this visit.      Subjective Assessment - 05/13/16 0941    Subjective Pt reports waking  up and LE feeling "funny"-reports it feeling like a tingling sensation but did feel more off balance and had to use the RW around the house.  Denies pain or numbness.  Does report increased edema yesterday and wound weeping.   Pertinent History 2013 breast CA with lumpectomy, h/o bilat THA, hypothyroid, PVD   Limitations Walking;House hold activities   Patient Stated Goals Walk normally again and not use AD   Currently in Pain? No/denies                         [facility] Adult PT Treatment/Exercise - 05/13/16 1020      Ambulation/Gait   Stairs Yes   Stairs Assistance 5: Supervision   Stairs Assistance Details (indicate cue type and reason) performed alternating sequence forwards to ascend and descend and then step to sequence during R and L lateral negotiation to focus on forwards, lateral weight shifting, single limb stance, and LE strengthening/balance   Stair Management Technique One rail Right;Two rails;Alternating pattern;Step to pattern;Sideways;Forwards   Number of Stairs 16   Height of Stairs 6  Balance Exercises - 05/13/16 0959      Balance Exercises: Standing   SLS Eyes open;Foam/compliant surface;Upper extremity support 2;2 reps;10 secs  standing on rockerboard, 2 sets R and LLE   Rockerboard Anterior/posterior;EO;10 reps  ankle and hip strategy training   Sidestepping Upper extremity support;Foam/compliant support;4 reps  solid and compliant surface   Marching Limitations High knee marching in // bars forwards/retro x 2 reps.  Solid surface and compliant surface             PT Short Term Goals - 04/28/16 1247      PT SHORT TERM GOAL #1   Title Will improve five time sit to stand time to 12 seconds without use of UE for improved functional LE strength (TARGET DATE FOR ALL STG 05/21/16)   Baseline 14 seconds without UE    Time 4   Period Weeks   Status New     PT SHORT TERM GOAL #2   Title Will decrease falls risk as indicated by  decrease in TUG score <15 seconds without RW    Baseline 18.63 without RW   Time 4   Period Weeks   Status New     PT SHORT TERM GOAL #3   Title Will ambulate up/down 8 stairs with one rail with alternating method and Mod I   Baseline supervision with 2 rails   Time 4   Period Weeks   Status New     PT SHORT TERM GOAL #4   Title Pt will perform gait on level, indoor surfaces without RW x 150' with supervision   Baseline Min A without RW   Time 4   Period Weeks   Status New     PT SHORT TERM GOAL #5   Title Will perform BERG to assess balance deficits and LTG to be set   Baseline Goal met 04/28/16; LTG set   Status Achieved           PT Long Term Goals - 04/28/16 1247      PT LONG TERM GOAL #1   Title Will improve hip and knee flexion strength to 5/5 bilaterally and will perform 5 times sit to stand in <12 seconds without UE support (TARGET DATE FOR ALL LTG 06/20/16)   Time 8   Period Weeks   Status New     PT LONG TERM GOAL #2   Title Pt will improve safety with gait as indicated by increase in 10 meter walk test/gait velocity to >2.62 ft/sec without AD   Baseline 2.12 ft/sec   Time 8   Period Weeks   Status New     PT LONG TERM GOAL #3   Title Will decrease falls risk as indicated by a TUG score of <13 seconds without RW    Time 8   Period Weeks   Status New     PT LONG TERM GOAL #4   Title Will perform HEP independently   Time 8   Period Weeks   Status New     PT LONG TERM GOAL #5   Title Will ambulate over uneven outdoor surfaces x 500 ft. and up/down 8 stairs with SPC mod I    Time 8   Period Weeks   Status New     PT LONG TERM GOAL #6   Title Pt will decrease falls risk and will improve BERG balance score to >50/56 overall   Baseline 45/56   Time 8   Period Weeks  Status Revised               Plan - 05/13/16 1307    Clinical Impression Statement Pt demonstrating more LE fatigue and decreased ability to perform exercises without UE  support but tolerated dynamic balance exercises with minimal UE support on solid, compliant surfaces and on rocker board with no LOB and no hip pain reported.  Initiated stair negotiation training without UE support for LE strengthening, balance, weight shifting and single limb stance training.  Will continue to address and monitor LE strength/function as pt goes down on Prednisone this weekend.   Rehab Potential Good   Clinical Impairments Affecting Rehab Potential 2 year history of LE weakness and steroid use   PT Treatment/Interventions ADLs/Self Care Home Management;Gait training;Stair training;Functional mobility training;DME Instruction;Therapeutic activities;Therapeutic exercise;Balance training;Neuromuscular re-education;Patient/family education;Passive range of motion;Taping   PT Next Visit Plan  stair training, focus on glute strengthening, gait without AD   Consulted and Agree with Plan of Care Patient      Patient will benefit from skilled therapeutic intervention in order to improve the following deficits and impairments:  Abnormal gait, Decreased balance, Decreased strength, Difficulty walking, Decreased endurance  Visit Diagnosis: Muscle weakness (generalized)  Difficulty in walking, not elsewhere classified  Unsteadiness on feet  Other abnormalities of gait and mobility     Problem List Patient Active Problem List   Diagnosis Date Noted  . SIRS (systemic inflammatory response syndrome) (HCC)   . Weakness of both lower extremities   . Arthritis   . Sepsis secondary to UTI ([place]) 03/18/2015  . Gout attack 10/23/2014  . Toe effusion 05/24/2014  . Anemia in neoplastic disease 04/02/2014  . CKD (chronic kidney disease), stage III 04/02/2014  . Wound infection 03/18/2014  . Cellulitis 03/18/2014  . Acute renal failure ([place]) 03/18/2014  . Leg edema, right   . Open wound of leg 02/22/2014  . Breast cancer, right breast ([place]) 05/13/2011  . Incisional hernia,  incarcerated, right upper quadrant 07/13/2010  . HEADACHE 11/06/2009  . CONTACT DERMATITIS 06/13/2009  . FATIGUE 06/13/2009  . EDEMA 05/01/2008  . BACK PAIN 03/29/2008  . CAROTID STENOSIS 03/16/2008  . ANXIETY 11/14/2007  . MENOPAUSAL SYNDROME 09/27/2007  . GLUCOSE INTOLERANCE 08/10/2007  . HYPERLIPIDEMIA 08/10/2007  . PERIPHERAL VASCULAR DISEASE 08/10/2007  . Venous (peripheral) insufficiency 08/10/2007  . Vitamin D deficiency 12/26/2006  . OBESITY 12/26/2006  . DUPUYTREN'S CONTRACTURE 12/26/2006  . Hypothyroidism 11/05/2006  . OSTEOARTHRITIS 11/05/2006    Raylene Everts, PT, DPT 05/13/16    1:13 PM    [facility] [address] [place], [place], [address] Phone: [phone]   Fax:  [phone]  Name: Rachael [name] MRN: [id number] Date of Birth: 1933/03/12

## 2016-05-14 ENCOUNTER — Ambulatory Visit (INDEPENDENT_AMBULATORY_CARE_PROVIDER_SITE_OTHER): Payer: PPO | Admitting: Family Medicine

## 2016-05-14 ENCOUNTER — Encounter: Payer: Self-pay | Admitting: Family Medicine

## 2016-05-14 VITALS — BP 120/64 | HR 58 | Temp 98.0°F | Wt 172.7 lb

## 2016-05-14 DIAGNOSIS — L03115 Cellulitis of right lower limb: Secondary | ICD-10-CM | POA: Diagnosis not present

## 2016-05-14 MED ORDER — CEPHALEXIN 500 MG PO CAPS: 500.0000 mg | ORAL_CAPSULE | Freq: Four times a day (QID) | ORAL | 0 refills | Status: AC

## 2016-05-14 NOTE — Progress Notes (Signed)
   Subjective:    Patient ID: Rachael [name], female    DOB: 1933/07/28, 83 y.o.   MRN: [id number]  HPI Here to check a wound on the right lower leg that was the result of falling 10 days ago in an [place] in [place]. She fell off the end of a bench and her walker struck the leg. The bleeding was stopped quickly, but the area has become red and warm lately. No fever.    Review of Systems  Constitutional: Negative.   Respiratory: Negative.   Cardiovascular: Negative.   Skin: Positive for wound.       Objective:   Physical Exam  Constitutional:  Walks with her walker   Cardiovascular: Normal rate, regular rhythm, normal heart sounds and intact distal pulses.   Pulmonary/Chest: Effort normal and breath sounds normal.  Skin:  The right lower leg has a superficial laceration about 2 cm long which is closed. This is surrounded by a zone of erythema and warmth. It is tender.           Assessment & Plan:  Early cellulitis around a laceration. Treat with Keflex.  Alysia Penna, MD

## 2016-05-17 ENCOUNTER — Ambulatory Visit: Payer: PPO | Admitting: Physical Therapy

## 2016-05-20 ENCOUNTER — Ambulatory Visit: Payer: PPO | Admitting: Physical Therapy

## 2016-05-24 ENCOUNTER — Encounter: Payer: Self-pay | Admitting: Physical Therapy

## 2016-05-24 ENCOUNTER — Ambulatory Visit: Payer: PPO | Admitting: Physical Therapy

## 2016-05-24 DIAGNOSIS — M6281 Muscle weakness (generalized): Secondary | ICD-10-CM

## 2016-05-24 DIAGNOSIS — R2689 Other abnormalities of gait and mobility: Secondary | ICD-10-CM

## 2016-05-24 DIAGNOSIS — R262 Difficulty in walking, not elsewhere classified: Secondary | ICD-10-CM

## 2016-05-24 DIAGNOSIS — R2681 Unsteadiness on feet: Secondary | ICD-10-CM

## 2016-05-24 NOTE — Therapy (Signed)
[facility] [address] [place], [place], [address] Phone: [phone]   Fax:  [phone]  Physical Therapy Treatment  Patient Details  Name: Rachael [name] MRN: [id number] Date of Birth: 07/12/1933 Referring Provider: Lahoma Rocker, MD  Encounter Date: 05/24/2016      PT End of Session - 05/24/16 1316    Visit Number 8   Number of Visits 17   Date for PT Re-Evaluation 06/20/16   Authorization Type [employer] G Code every 10th visit   PT Start Time 1100   PT Stop Time 1145   PT Time Calculation (min) 45 min   Activity Tolerance Patient limited by fatigue;Patient limited by pain   Behavior During Therapy [facility] for tasks assessed/performed      Past Medical History:  Diagnosis Date  . Breast cancer (HCC)    R  . CAROTID STENOSIS   . Chronic venous insufficiency   . CONTACT DERMATITIS   . DUPUYTREN'S CONTRACTURE   . Edema    pt  states had edema of feet and legs has been on lasix per dr. to help  . GLUCOSE INTOLERANCE   . HLD (hyperlipidemia)   . Hypothyroidism    Dr. Ala Dach in [place]   . MENOPAUSAL SYNDROME   . Obesity   . Osteoarthritis    bilat hips, severe  . PVD (peripheral vascular disease) ([place])    mild carotid 11/05  . VITAMIN D DEFICIENCY     Past Surgical History:  Procedure Laterality Date  . 2D echo  11/29/03   normal LV size. normal EF   . [place]   lumbarectomy  . BREAST BIOPSY  05/11/11   right breast  . BREAST LUMPECTOMY     right breast  . CHOLECYSTECTOMY    . FINGER SURGERY    . HERNIA REPAIR     incisional hernia  . INGUINAL HERNIA REPAIR    . JOINT REPLACEMENT Bilateral    Dr. Maureen Ralphs  . normal caronary arteries     per pt. 2003  . right rotator cuff surgery    . TONSILLECTOMY    . TOTAL HIP ARTHROPLASTY  05/2008 and 10/2008   B hip - alusio    There were no vitals filed for this visit.      Subjective Assessment - 05/24/16 1106    Subjective  Patient reports today is "not a good day". She reports pain and difficulty in ambulation and transferring sit to stand and stand to sit. She reports her pain is related to her decrease in prednisone dosage. Patient reports today is her last day of antibiotics for the cellulitis in her leg, and her wound is clearing up.    Pertinent History 2013 breast CA with lumpectomy, h/o bilat THA, hypothyroid, PVD   Limitations Walking;House hold activities   Patient Stated Goals Walk normally again and not use AD   Currently in Pain? Other (Comment)  Achy due to decresae in prednisone dosage                         [facility] Adult PT Treatment/Exercise - 05/24/16 1100      Ambulation/Gait   Ambulation/Gait Yes   Ambulation/Gait Assistance 4: Min guard  with rollator due to weakness and fatigue today    Ambulation/Gait Assistance Details patient required min guard today due to patient's presentation today of weakness and fatigue    Ambulation Distance (Feet) 125 Feet  Assistive device Rollator   Gait Pattern Step-through pattern;Decreased stance time - right;Decreased stride length;Decreased weight shift to right;Lateral trunk lean to left;Trunk flexed;Decreased arm swing - right;Decreased arm swing - left   Ambulation Surface Level;Indoor   Stairs --   [employer] --   Stair Management Technique --   Number of Stairs --   Height of Stairs --     Lumbar Exercises: Supine   Glut Set 10 reps;3 seconds  cued patient to begin bridge, but unable to lift   Clam 10 reps   Clam Limitations performed bilaterally (one leg at a time) from supine position    Heel Slides 10 reps  performed bilaterally    Isometric Hip Flexion 10 reps;3 seconds  performed bilaterally with tactile cueing    Other Supine Lumbar Exercises From hooklying position, stepped foot to side (approximately 4 inches), then returned to center. Performed bilaterally. Required cueing to engage core.                  PT Education - 05/24/16 1128    Education provided Yes   Education Details discontinue HEP for the time being due to current fatigue and pain during prednisone taper    Person(s) Educated Patient   Methods Explanation   Comprehension Verbalized understanding          PT Short Term Goals - 05/24/16 1111      PT SHORT TERM GOAL #1   Title Will improve five time sit to stand time to 12 seconds without use of UE for improved functional LE strength (TARGET DATE FOR ALL STG 05/21/16) (TARGET DATE: all STGs now due 06/04/2016 due to missed therapy due to cellulitis)    Baseline 14 seconds without UE    Time 4   Period Weeks   Status New     PT SHORT TERM GOAL #2   Title Will decrease falls risk as indicated by decrease in TUG score <15 seconds without RW    Baseline 18.63 without RW   Time 4   Period Weeks   Status New     PT SHORT TERM GOAL #3   Title Will ambulate up/down 8 stairs with one rail with alternating method and Mod I   Baseline supervision with 2 rails   Time 4   Period Weeks   Status New     PT SHORT TERM GOAL #4   Title Pt will perform gait on level, indoor surfaces without RW x 150' with supervision   Baseline Min A without RW   Time 4   Period Weeks   Status New     PT SHORT TERM GOAL #5   Title Will perform BERG to assess balance deficits and LTG to be set   Baseline Goal met 04/28/16; LTG set   Status Achieved           PT Long Term Goals - 04/28/16 1247      PT LONG TERM GOAL #1   Title Will improve hip and knee flexion strength to 5/5 bilaterally and will perform 5 times sit to stand in <12 seconds without UE support (TARGET DATE FOR ALL LTG 06/20/16)   Time 8   Period Weeks   Status New     PT LONG TERM GOAL #2   Title Pt will improve safety with gait as indicated by increase in 10 meter walk test/gait velocity to >2.62 ft/sec without AD   Baseline 2.12 ft/sec   Time 8  Period Weeks   Status New     PT LONG TERM  GOAL #3   Title Will decrease falls risk as indicated by a TUG score of <13 seconds without RW    Time 8   Period Weeks   Status New     PT LONG TERM GOAL #4   Title Will perform HEP independently   Time 8   Period Weeks   Status New     PT LONG TERM GOAL #5   Title Will ambulate over uneven outdoor surfaces x 500 ft. and up/down 8 stairs with SPC mod I    Time 8   Period Weeks   Status New     PT LONG TERM GOAL #6   Title Pt will decrease falls risk and will improve BERG balance score to >50/56 overall   Baseline 45/56   Time 8   Period Weeks   Status Revised               Plan - 05/24/16 1321    Clinical Impression Statement Today's skilled PT session focused on LE strengthening from a supine position due to patient's presentation of increased pain and fatigue related to her prednisone taper. Patient was limited by pain and fatigue during today's exercises. Patient's complaints of pain were greatest in her posterior hips and pelvis during today's exercises. PT tailored exercises to within a pain free range throughout today's session. Patient's short term goals are due, but PT has decided to postpone them due to patient missing a week of therapy due to cellulitus and having to keep her legs elevated. Patient will benefit from skilled PT to address functional mobility deficits.     Rehab Potential Good   Clinical Impairments Affecting Rehab Potential 2 year history of LE weakness and steroid use   PT Treatment/Interventions ADLs/Self Care Home Management;Gait training;Stair training;Functional mobility training;DME Instruction;Therapeutic activities;Therapeutic exercise;Balance training;Neuromuscular re-education;Patient/family education;Passive range of motion;Taping   PT Next Visit Plan LE strengthening, assess patient's pain and fatigue relative to recent prednisone taper    Consulted and Agree with Plan of Care Patient      Patient will benefit from skilled therapeutic  intervention in order to improve the following deficits and impairments:  Abnormal gait, Decreased balance, Decreased strength, Difficulty walking, Decreased endurance  Visit Diagnosis: Muscle weakness (generalized)  Difficulty in walking, not elsewhere classified  Unsteadiness on feet  Other abnormalities of gait and mobility     Problem List Patient Active Problem List   Diagnosis Date Noted  . SIRS (systemic inflammatory response syndrome) (HCC)   . Weakness of both lower extremities   . Arthritis   . Sepsis secondary to UTI ([place]) 03/18/2015  . Gout attack 10/23/2014  . Toe effusion 05/24/2014  . Anemia in neoplastic disease 04/02/2014  . CKD (chronic kidney disease), stage III 04/02/2014  . Wound infection 03/18/2014  . Cellulitis 03/18/2014  . Acute renal failure ([place]) 03/18/2014  . Leg edema, right   . Open wound of leg 02/22/2014  . Breast cancer, right breast ([place]) 05/13/2011  . Incisional hernia, incarcerated, right upper quadrant 07/13/2010  . HEADACHE 11/06/2009  . CONTACT DERMATITIS 06/13/2009  . FATIGUE 06/13/2009  . EDEMA 05/01/2008  . BACK PAIN 03/29/2008  . CAROTID STENOSIS 03/16/2008  . ANXIETY 11/14/2007  . MENOPAUSAL SYNDROME 09/27/2007  . GLUCOSE INTOLERANCE 08/10/2007  . HYPERLIPIDEMIA 08/10/2007  . PERIPHERAL VASCULAR DISEASE 08/10/2007  . Venous (peripheral) insufficiency 08/10/2007  . Vitamin D deficiency 12/26/2006  . OBESITY  12/26/2006  . DUPUYTREN'S CONTRACTURE 12/26/2006  . Hypothyroidism 11/05/2006  . OSTEOARTHRITIS 11/05/2006    Arelia Sneddon, SPT  05/24/2016, 1:34 PM  [facility] [address] [place], [place], [address] Phone: [phone]   Fax:  [phone]  Name: LETTY SALVI MRN: [id number] Date of Birth: 1933-11-20

## 2016-05-27 ENCOUNTER — Ambulatory Visit: Payer: PPO | Admitting: Physical Therapy

## 2016-05-31 ENCOUNTER — Ambulatory Visit: Payer: PPO | Admitting: Physical Therapy

## 2016-05-31 ENCOUNTER — Encounter: Payer: Self-pay | Admitting: Physical Therapy

## 2016-05-31 DIAGNOSIS — M6281 Muscle weakness (generalized): Secondary | ICD-10-CM

## 2016-05-31 DIAGNOSIS — R262 Difficulty in walking, not elsewhere classified: Secondary | ICD-10-CM

## 2016-05-31 DIAGNOSIS — R2681 Unsteadiness on feet: Secondary | ICD-10-CM

## 2016-05-31 DIAGNOSIS — R2689 Other abnormalities of gait and mobility: Secondary | ICD-10-CM

## 2016-05-31 NOTE — Therapy (Signed)
[facility] [address] [place], [place], [address] Phone: [phone]   Fax:  [phone]  Physical Therapy Treatment  Patient Details  Name: Rachael [name] MRN: [id number] Date of Birth: 04-16-1933 Referring Provider: Lahoma Rocker, MD  Encounter Date: 05/31/2016      PT End of Session - 05/31/16 2201    Visit Number 9   Number of Visits 17   Date for PT Re-Evaluation 06/20/16   Authorization Type [employer] G Code every 10th visit   PT Start Time 1018   PT Stop Time 1100   PT Time Calculation (min) 42 min   Activity Tolerance Patient limited by pain   Behavior During Therapy [facility] for tasks assessed/performed      Past Medical History:  Diagnosis Date  . Breast cancer (HCC)    R  . CAROTID STENOSIS   . Chronic venous insufficiency   . CONTACT DERMATITIS   . DUPUYTREN'S CONTRACTURE   . Edema    pt  states had edema of feet and legs has been on lasix per dr. to help  . GLUCOSE INTOLERANCE   . HLD (hyperlipidemia)   . Hypothyroidism    Dr. Ala Dach in [place]   . MENOPAUSAL SYNDROME   . Obesity   . Osteoarthritis    bilat hips, severe  . PVD (peripheral vascular disease) ([place])    mild carotid 11/05  . VITAMIN D DEFICIENCY     Past Surgical History:  Procedure Laterality Date  . 2D echo  11/29/03   normal LV size. normal EF   . [place]   lumbarectomy  . BREAST BIOPSY  05/11/11   right breast  . BREAST LUMPECTOMY     right breast  . CHOLECYSTECTOMY    . FINGER SURGERY    . HERNIA REPAIR     incisional hernia  . INGUINAL HERNIA REPAIR    . JOINT REPLACEMENT Bilateral    Dr. Maureen Ralphs  . normal caronary arteries     per pt. 2003  . right rotator cuff surgery    . TONSILLECTOMY    . TOTAL HIP ARTHROPLASTY  05/2008 and 10/2008   B hip - alusio    There were no vitals filed for this visit.      Subjective Assessment - 05/31/16 1020    Subjective Pt reports continuing to  feel very weak in bilat LE after decreasing prednisone a couple of weeks ago.  Is supposed to go down again in dosage but will discuss with MD.  Woke up last week with large knot in foot and unable to attend therapy session.  Knot improved but then woke up with swelling with wrist, will discuss with MD.   Pertinent History 2013 breast CA with lumpectomy, h/o bilat THA, hypothyroid, PVD   Limitations Walking;House hold activities   Patient Stated Goals Walk normally again and not use AD            [facility] PT Assessment - 05/31/16 1041      Ambulation/Gait   Ambulation/Gait Yes   Ambulation/Gait Assistance 5: Supervision   Ambulation Distance (Feet) 230 Feet   Assistive device None   Gait Pattern Step-through pattern;Decreased stride length;Trendelenburg;Lateral hip instability;Lateral trunk lean to left;Trunk flexed;Narrow base of support   Ambulation Surface Level;Indoor   Stairs Yes   Stairs Assistance 5: Supervision   Stairs Assistance Details (indicate cue type and reason) Able to perform alternating sequence with one UE support to ascend  but required bilat UE support (on one rail laterally) or bilat rail use to descend step to sequence due to RLE buckling this am   Stair Management Technique One rail Right;Two rails;Alternating pattern;Step to pattern;Sideways;Forwards   Number of Stairs 8   Height of Stairs 6     Standardized Balance Assessment   Standardized Balance Assessment Timed Up and Go Test;Five Times Sit to Stand   Five times sit to stand comments  9.47 seconds, without UE support     Timed Up and Go Test   TUG Normal TUG   Normal TUG (seconds) 12.94                     [facility] Adult PT Treatment/Exercise - 05/31/16 1041      Neuro Re-ed    Neuro Re-ed Details  NMR seated on physioball performing trunk/postural control training, weight shifting and proximal stability training during heel lifts, high knee marches, high knee marches with contralateral UE  flexion, and hip flexion with ABD<>ADD all 12 reps total with min A for balance, and sit <> stand from ball x 10 reps                  PT Short Term Goals - 05/31/16 1024      PT SHORT TERM GOAL #1   Title Will improve five time sit to stand time to 12 seconds without use of UE for improved functional LE strength (TARGET DATE: all STGs now due 06/04/2016 due to missed therapy due to cellulitis)    Baseline 14 seconds without UE; 9.47 seconds on 05/31/2016   Status Achieved     PT SHORT TERM GOAL #2   Title Will decrease falls risk as indicated by decrease in TUG score <15 seconds without RW    Baseline 18.63 without RW; 12.94 seconds without RW on 05/31/2016   Status Achieved     PT SHORT TERM GOAL #3   Title Will ambulate up/down 8 stairs with one rail with alternating method and Mod I   Baseline supervision with 2 rails; supervision with one rail and alternating sequence to ascend, bilat UE support and step to sequence to descend due to RLE buckling today, partially met   Status Partially Met     PT SHORT TERM GOAL #4   Title Pt will perform gait on level, indoor surfaces without RW x 150' with supervision   Baseline Min A without RW; supervision x 230' on indoor, level surface without RW   Status Achieved     PT SHORT TERM GOAL #5   Title Will perform BERG to assess balance deficits and LTG to be set   Baseline Goal met 04/28/16; LTG set   Status Achieved           PT Long Term Goals - 05/31/16 1037      PT LONG TERM GOAL #1   Title Will improve hip and knee flexion strength to 5/5 bilaterally and will perform 5 times sit to stand in <12 seconds without UE support (TARGET DATE FOR ALL LTG 06/20/16)   Time 8   Period Weeks   Status Achieved     PT LONG TERM GOAL #2   Title Pt will improve safety with gait as indicated by increase in 10 meter walk test/gait velocity to >2.62 ft/sec without AD   Baseline 2.12 ft/sec   Time 8   Period Weeks   Status New     PT  LONG  TERM GOAL #3   Title Will decrease falls risk as indicated by a TUG score of <13 seconds without RW    Time 8   Period Weeks   Status Achieved     PT LONG TERM GOAL #4   Title Will perform HEP independently   Time 8   Period Weeks   Status New     PT LONG TERM GOAL #5   Title Will ambulate over uneven outdoor surfaces x 500 ft. and up/down 8 stairs with SPC mod I    Time 8   Period Weeks   Status New     PT LONG TERM GOAL #6   Title Pt will decrease falls risk and will improve BERG balance score to >50/56 overall   Baseline 45/56   Time 8   Period Weeks   Status Revised               Plan - 05/31/16 2202    Clinical Impression Statement Treatment session today focused on re-assessment of STG.  Despite weakness and pain from prednisone taper pt has met 4/5 STG and has made significant improvements in functional LE strength, gait speed and balance.  Pt only partially met stair goal and required bilat UE support to descend stairs due to LE weakness and hip/knee buckling.  Pt also has a goal to improve balance to be able to perform line dancing without RW.  Following assessment of STG pt engaged in core and proximal hip stability and balance training seated on physioball.  Will continue to monitor changes in patient strength and function as pt continues to decrease prednisone; will continue to address impairments and progress towards LTG.     Rehab Potential Good   Clinical Impairments Affecting Rehab Potential 2 year history of LE weakness and steroid use   PT Frequency 2x / week   PT Duration 8 weeks   PT Treatment/Interventions ADLs/Self Care Home Management;Gait training;Stair training;Functional mobility training;DME Instruction;Therapeutic activities;Therapeutic exercise;Balance training;Neuromuscular re-education;Patient/family education;Passive range of motion;Taping   PT Next Visit Plan 10th visit G code and progress note due!  STG already assessed; Continue to  focus on LE strengthening, balance, gait without UE support; stairs.  Line Dancing moves!   Consulted and Agree with Plan of Care Patient      Patient will benefit from skilled therapeutic intervention in order to improve the following deficits and impairments:  Abnormal gait, Decreased balance, Decreased strength, Difficulty walking, Decreased endurance  Visit Diagnosis: Muscle weakness (generalized)  Difficulty in walking, not elsewhere classified  Unsteadiness on feet  Other abnormalities of gait and mobility     Problem List Patient Active Problem List   Diagnosis Date Noted  . SIRS (systemic inflammatory response syndrome) (HCC)   . Weakness of both lower extremities   . Arthritis   . Sepsis secondary to UTI ([place]) 03/18/2015  . Gout attack 10/23/2014  . Toe effusion 05/24/2014  . Anemia in neoplastic disease 04/02/2014  . CKD (chronic kidney disease), stage III 04/02/2014  . Wound infection 03/18/2014  . Cellulitis 03/18/2014  . Acute renal failure ([place]) 03/18/2014  . Leg edema, right   . Open wound of leg 02/22/2014  . Breast cancer, right breast ([place]) 05/13/2011  . Incisional hernia, incarcerated, right upper quadrant 07/13/2010  . HEADACHE 11/06/2009  . CONTACT DERMATITIS 06/13/2009  . FATIGUE 06/13/2009  . EDEMA 05/01/2008  . BACK PAIN 03/29/2008  . CAROTID STENOSIS 03/16/2008  . ANXIETY 11/14/2007  . MENOPAUSAL SYNDROME 09/27/2007  .  GLUCOSE INTOLERANCE 08/10/2007  . HYPERLIPIDEMIA 08/10/2007  . PERIPHERAL VASCULAR DISEASE 08/10/2007  . Venous (peripheral) insufficiency 08/10/2007  . Vitamin D deficiency 12/26/2006  . OBESITY 12/26/2006  . DUPUYTREN'S CONTRACTURE 12/26/2006  . Hypothyroidism 11/05/2006  . OSTEOARTHRITIS 11/05/2006    Raylene Everts, PT, DPT 05/31/16    10:13 PM    [facility] [address] [place], [place], [address] Phone: [phone]   Fax:  [phone]  Name: CHANNELLE BOTTGER MRN: [id number] Date of Birth: 02/07/1933

## 2016-06-03 ENCOUNTER — Ambulatory Visit: Payer: PPO | Admitting: Physical Therapy

## 2016-06-03 ENCOUNTER — Encounter: Payer: Self-pay | Admitting: Physical Therapy

## 2016-06-03 DIAGNOSIS — M6281 Muscle weakness (generalized): Secondary | ICD-10-CM | POA: Diagnosis not present

## 2016-06-03 DIAGNOSIS — R2681 Unsteadiness on feet: Secondary | ICD-10-CM

## 2016-06-03 DIAGNOSIS — R262 Difficulty in walking, not elsewhere classified: Secondary | ICD-10-CM

## 2016-06-03 DIAGNOSIS — R2689 Other abnormalities of gait and mobility: Secondary | ICD-10-CM

## 2016-06-03 NOTE — Therapy (Addendum)
[facility] [address] [place], [place], [address] Phone: [phone]   Fax:  [phone]  Physical Therapy Treatment  Patient Details  Name: Rachael [name] MRN: [id number] Date of Birth: 1933/03/14 Referring Provider: Lahoma Rocker, MD  Encounter Date: 06/03/2016      PT End of Session - 06/03/16 1305    Visit Number 10   Number of Visits 17   Date for PT Re-Evaluation 06/20/16   Authorization Type [employer] G Code every 10th visit   PT Start Time 1015   PT Stop Time 1100   PT Time Calculation (min) 45 min   Equipment Utilized During Treatment Gait belt   Activity Tolerance Patient tolerated treatment well   Behavior During Therapy [facility] for tasks assessed/performed      Past Medical History:  Diagnosis Date  . Breast cancer (HCC)    R  . CAROTID STENOSIS   . Chronic venous insufficiency   . CONTACT DERMATITIS   . DUPUYTREN'S CONTRACTURE   . Edema    pt  states had edema of feet and legs has been on lasix per dr. to help  . GLUCOSE INTOLERANCE   . HLD (hyperlipidemia)   . Hypothyroidism    Dr. Ala Dach in [place]   . MENOPAUSAL SYNDROME   . Obesity   . Osteoarthritis    bilat hips, severe  . PVD (peripheral vascular disease) ([place])    mild carotid 11/05  . VITAMIN D DEFICIENCY     Past Surgical History:  Procedure Laterality Date  . 2D echo  11/29/03   normal LV size. normal EF   . [place]   lumbarectomy  . BREAST BIOPSY  05/11/11   right breast  . BREAST LUMPECTOMY     right breast  . CHOLECYSTECTOMY    . FINGER SURGERY    . HERNIA REPAIR     incisional hernia  . INGUINAL HERNIA REPAIR    . JOINT REPLACEMENT Bilateral    Dr. Maureen Ralphs  . normal caronary arteries     per pt. 2003  . right rotator cuff surgery    . TONSILLECTOMY    . TOTAL HIP ARTHROPLASTY  05/2008 and 10/2008   B hip - alusio    There were no vitals filed for this visit.      Subjective  Assessment - 06/03/16 1019    Subjective Patient reports since 06/01/2016 she has noted a decrease in pain and an increase in mobility. Patient ambulates in to PT with single point cane, but reports she is walking around her house without the cane. Patient reports she hsa practiced line dancing across her living room. Patient reports her prednisone dosage is scheduled to taper again tomorrow.   Pertinent History 2013 breast CA with lumpectomy, h/o bilat THA, hypothyroid, PVD   Limitations Walking;House hold activities   Patient Stated Goals Walk normally again and not use AD   Currently in Pain? No/denies            [facility] PT Assessment - 06/03/16 1015      Berg Balance Test   Sit to Stand Able to stand without using hands and stabilize independently   Standing Unsupported Able to stand safely 2 minutes   Sitting with Back Unsupported but Feet Supported on Floor or Stool Able to sit safely and securely 2 minutes   Stand to Sit Sits safely with minimal use of hands   Transfers Able to transfer safely, definite  need of hands   Standing Unsupported with Eyes Closed Able to stand 10 seconds safely   Standing Ubsupported with Feet Together Able to place feet together independently and stand for 1 minute with supervision   From Standing, Reach Forward with Outstretched Arm Can reach confidently >25 cm (10")   From Standing Position, Pick up Object from [place] to pick up shoe safely and easily   From Standing Position, Turn to Look Behind Over each Shoulder Looks behind one side only/other side shows less weight shift   Turn 360 Degrees Able to turn 360 degrees safely in 4 seconds or less   Standing Unsupported, Alternately Place Feet on Step/Stool Able to complete >2 steps/needs minimal assist   Standing Unsupported, One Foot in Front Able to plae foot ahead of the other independently and hold 30 seconds   Standing on One Leg Able to lift leg independently and hold equal to or more than 3  seconds   Total Score 47    Patient demonstrates moderate fall risk as noted by score of  47/56 on Berg Balance Scale.  (<36= high risk for falls, close to 100%; 37-45 significant >80%; 46-51 moderate >50%; 52-55 lower >25%)        [facility] Adult PT Treatment/Exercise - 06/03/16 1015      Transfers   Transfers Sit to Stand;Stand to Sit   Sit to Stand 5: Supervision;Without upper extremity assist;From bed   Stand to Sit 5: Supervision;To bed  with minimal use of UEs to control descent      Ambulation/Gait   Ambulation/Gait Yes   Ambulation/Gait Assistance 5: Supervision   Ambulation Distance (Feet) 125 Feet   Assistive device None  ambulated into PT with single point cane    Gait Pattern Step-through pattern;Decreased stride length;Trendelenburg;Lateral hip instability;Lateral trunk lean to left;Trunk flexed;Narrow base of support   Ambulation Surface Level;Indoor   Stairs Yes   Stairs Assistance 5: Supervision   Stairs Assistance Details (indicate cue type and reason) Patient performed reciprocal gait pattern when ascending stairs, and a step to gait pattern when descending. Patient reported discomfort in R hip when descending with RLE.   Stair Management Technique One rail Right;Alternating pattern;Step to pattern;Forwards   Number of Stairs 8   Height of Stairs 6     Neuro Re-ed    Neuro Re-ed Details  --     Knee/Hip Exercises: Stretches   Other Knee/Hip Stretches PT positioned patient in L sidelying with pillow between legs. PT foam rolled patient's R ITB. Patient reported a decrease in R hip and R ITB tenderness and discomfort.     Knee/Hip Exercises: Standing   Hip Abduction Stengthening;Right  1 rep   Abduction Limitations Patient stood on bottom step on stairs. Patient's L foot over edge of step (not supported), and patient cued to isometrically hold pelvis level and maintain LLE position. Patient held for <10s prior to reporting discomfort in R hip.                 PT Education - 06/03/16 1304    Education provided Yes   Education Details interpretation of Berg Balance Test results, taper exercises/HEP as needed based on patient's symptoms beginning tomorrow with the prednisone dosage taper    Person(s) Educated Patient   Methods Explanation   Comprehension Verbalized understanding          PT Short Term Goals - 05/31/16 1024      PT SHORT TERM GOAL #1  Title Will improve five time sit to stand time to 12 seconds without use of UE for improved functional LE strength (TARGET DATE: all STGs now due 06/04/2016 due to missed therapy due to cellulitis)    Baseline 14 seconds without UE; 9.47 seconds on 05/31/2016   Status Achieved     PT SHORT TERM GOAL #2   Title Will decrease falls risk as indicated by decrease in TUG score <15 seconds without RW    Baseline 18.63 without RW; 12.94 seconds without RW on 05/31/2016   Status Achieved     PT SHORT TERM GOAL #3   Title Will ambulate up/down 8 stairs with one rail with alternating method and Mod I   Baseline supervision with 2 rails; supervision with one rail and alternating sequence to ascend, bilat UE support and step to sequence to descend due to RLE buckling today, partially met   Status Partially Met     PT SHORT TERM GOAL #4   Title Pt will perform gait on level, indoor surfaces without RW x 150' with supervision   Baseline Min A without RW; supervision x 230' on indoor, level surface without RW   Status Achieved     PT SHORT TERM GOAL #5   Title Will perform BERG to assess balance deficits and LTG to be set   Baseline Goal met 04/28/16; LTG set   Status Achieved           PT Long Term Goals - 06/03/16 1204      PT LONG TERM GOAL #1   Title Will improve hip and knee flexion strength to 5/5 bilaterally and will perform 5 times sit to stand in <12 seconds without UE support (TARGET DATE FOR ALL LTG 06/20/16)   Baseline met on 05/31/16   Status Achieved     PT LONG TERM GOAL #2    Title Pt will improve safety with gait as indicated by increase in 10 meter walk test/gait velocity to >2.62 ft/sec without AD   Baseline 2.12 ft/sec   Time 8   Period Weeks   Status On-going     PT LONG TERM GOAL #3   Title Will decrease falls risk as indicated by a TUG score of <13 seconds without RW    Baseline achieved 05/31/16   Status Achieved     PT LONG TERM GOAL #4   Title Will perform HEP independently   Time 8   Period Weeks   Status On-going     PT LONG TERM GOAL #5   Title Will ambulate over uneven outdoor surfaces x 500 ft. and up/down 8 stairs with SPC mod I    Time 8   Period Weeks   Status On-going     PT LONG TERM GOAL #6   Title Pt will decrease falls risk and will improve BERG balance score to >50/56 overall   Baseline 47/56 on 06/03/2016   Time 8   Period Weeks   Status On-going               Plan - 06/03/16 1309    Clinical Impression Statement Today's skilled PT session focused on assessing the patient's progress by performing the Berg Balance Test. At baseline, patient scored 40/56, and today the patient scored 47/56 indicating she is making progress, but has not yet met her long term goal for the [facility] Balance Test. PT addressed the pain and discomfort in the patient's R hip and ITB via isometric glut med activation  on the steps. PT performed foam rolling on R ITB, and patient reported a decrease in tenderness and discomfort throughout soft tissue work. Patient is making progress towards goals, and will benefit from continued skilled PT to address functional mobility deficits.     Rehab Potential Good   Clinical Impairments Affecting Rehab Potential 2 year history of LE weakness and steroid use   PT Frequency 2x / week   PT Duration 8 weeks   PT Treatment/Interventions ADLs/Self Care Home Management;Gait training;Stair training;Functional mobility training;DME Instruction;Therapeutic activities;Therapeutic exercise;Balance training;Neuromuscular  re-education;Patient/family education;Passive range of motion;Taping   PT Next Visit Plan begin with R ITB foam rolling then proress to R glut med activation exercises; incorporate line dancing moves with balance activities    Consulted and Agree with Plan of Care Patient      Patient will benefit from skilled therapeutic intervention in order to improve the following deficits and impairments:  Abnormal gait, Decreased balance, Decreased strength, Difficulty walking, Decreased endurance  Visit Diagnosis: Muscle weakness (generalized)  Difficulty in walking, not elsewhere classified  Unsteadiness on feet  Other abnormalities of gait and mobility       G-Codes - 2016/06/09 1112    Functional Assessment Tool Used (Outpatient Only) TUG, five times sit to stand and BERG: 47/56   Functional Limitation Mobility: Walking and moving around   Mobility: Walking and Moving Around Current Status [phone]) At least 1 percent but less than 20 percent impaired, limited or restricted   Mobility: Walking and Moving Around Goal Status [phone]) At least 1 percent but less than 20 percent impaired, limited or restricted     Physical Therapy Progress Note  Dates of Reporting Period: 04/21/2016 to 06-09-2016  Objective Reports of Subjective Statement: Please see above  Objective Measurements: See BERG; five times sit to stand in 9.47 seconds, TUG 12.94 seconds  Goal Update: pt has met all but 1 STG and has met 2 LTG; see above  Plan: See above  Reason Skilled Services are Required: to continue to address LE pain, weakness, impaired endurance, balance and gait to continue to progress towards LTG  G Code and progress note entered by: Raylene Everts, PT, DPT 06/09/16    1:29 PM     Problem List Patient Active Problem List   Diagnosis Date Noted  . SIRS (systemic inflammatory response syndrome) (HCC)   . Weakness of both lower extremities   . Arthritis   . Sepsis secondary to UTI ([place]) 03/18/2015  .  Gout attack 10/23/2014  . Toe effusion 05/24/2014  . Anemia in neoplastic disease 04/02/2014  . CKD (chronic kidney disease), stage III 04/02/2014  . Wound infection 03/18/2014  . Cellulitis 03/18/2014  . Acute renal failure ([place]) 03/18/2014  . Leg edema, right   . Open wound of leg 02/22/2014  . Breast cancer, right breast ([place]) 05/13/2011  . Incisional hernia, incarcerated, right upper quadrant 07/13/2010  . HEADACHE 11/06/2009  . CONTACT DERMATITIS 06/13/2009  . FATIGUE 06/13/2009  . EDEMA 05/01/2008  . BACK PAIN 03/29/2008  . CAROTID STENOSIS 03/16/2008  . ANXIETY 11/14/2007  . MENOPAUSAL SYNDROME 09/27/2007  . GLUCOSE INTOLERANCE 08/10/2007  . HYPERLIPIDEMIA 08/10/2007  . PERIPHERAL VASCULAR DISEASE 08/10/2007  . Venous (peripheral) insufficiency 08/10/2007  . Vitamin D deficiency 12/26/2006  . OBESITY 12/26/2006  . DUPUYTREN'S CONTRACTURE 12/26/2006  . Hypothyroidism 11/05/2006  . OSTEOARTHRITIS 11/05/2006    Arelia Sneddon, SPT  Jun 09, 2016, 1:14 PM  [facility] [address] [place],  [place], [address] Phone: [phone]   Fax:  [phone]  Name: Rachael [name] MRN: [id number] Date of Birth: Jan 09, 1934

## 2016-06-09 ENCOUNTER — Encounter: Payer: Self-pay | Admitting: Physical Therapy

## 2016-06-09 ENCOUNTER — Ambulatory Visit: Payer: PPO | Admitting: Physical Therapy

## 2016-06-09 DIAGNOSIS — R2681 Unsteadiness on feet: Secondary | ICD-10-CM

## 2016-06-09 DIAGNOSIS — R2689 Other abnormalities of gait and mobility: Secondary | ICD-10-CM

## 2016-06-09 DIAGNOSIS — R262 Difficulty in walking, not elsewhere classified: Secondary | ICD-10-CM

## 2016-06-09 DIAGNOSIS — M6281 Muscle weakness (generalized): Secondary | ICD-10-CM

## 2016-06-09 NOTE — Therapy (Signed)
[name] [address] [place], [place], [address] Phone: [phone]   Fax:  [phone]  Physical Therapy Treatment  Patient Details  Name: Rachael [name] MRN: [id number] Date of Birth: 1933-06-26 Referring Provider: Lahoma Rocker, MD  Encounter Date: 06/09/2016      PT End of Session - 06/09/16 1225    Visit Number 11   Number of Visits 17   Date for PT Re-Evaluation 06/20/16   Authorization Type [employer] G Code every 10th visit   PT Start Time 4097   PT Stop Time 1100   PT Time Calculation (min) 45 min   Activity Tolerance Patient tolerated treatment well   Behavior During Therapy [facility] for tasks assessed/performed      Past Medical History:  Diagnosis Date  . Breast cancer (HCC)    R  . CAROTID STENOSIS   . Chronic venous insufficiency   . CONTACT DERMATITIS   . DUPUYTREN'S CONTRACTURE   . Edema    pt  states had edema of feet and legs has been on lasix per dr. to help  . GLUCOSE INTOLERANCE   . HLD (hyperlipidemia)   . Hypothyroidism    Dr. Ala Dach in [place]   . MENOPAUSAL SYNDROME   . Obesity   . Osteoarthritis    bilat hips, severe  . PVD (peripheral vascular disease) ([place])    mild carotid 11/05  . VITAMIN D DEFICIENCY     Past Surgical History:  Procedure Laterality Date  . 2D echo  11/29/03   normal LV size. normal EF   . [name]   lumbarectomy  . BREAST BIOPSY  05/11/11   right breast  . BREAST LUMPECTOMY     right breast  . CHOLECYSTECTOMY    . FINGER SURGERY    . HERNIA REPAIR     incisional hernia  . INGUINAL HERNIA REPAIR    . JOINT REPLACEMENT Bilateral    Dr. Maureen Ralphs  . normal caronary arteries     per pt. 2003  . right rotator cuff surgery    . TONSILLECTOMY    . TOTAL HIP ARTHROPLASTY  05/2008 and 10/2008   B hip - alusio    There were no vitals filed for this visit.      Subjective Assessment - 06/09/16 1019    Subjective Patient reports  her prednisone taper (by 2.68m) occured on 06/05/2016. Patient reports noting an incrase in soreness in her buttocks and bilateral knees and an increase in fatigue. Patient reports utilizing rollator more than cane since prednisone taper. Patient denies any falls.   Pertinent History 2013 breast CA with lumpectomy, h/o bilat THA, hypothyroid, PVD   Limitations Walking;House hold activities   Patient Stated Goals Walk normally again and not use AD   Currently in Pain? Yes   Pain Score 5    Pain Location Buttocks   Pain Descriptors / Indicators Aching;Sore   Pain Type Acute pain  since prednisone taper on 06/05/2016   Pain Onset In the past 7 days   Pain Frequency Constant   Pain Relieving Factors side lying                          [facility] Adult PT Treatment/Exercise - 06/09/16 1015      Transfers   Transfers Sit to Stand;Stand to Sit   Sit to Stand 5: Supervision;From bed  to rollator    Stand to Sit  5: Supervision;To bed;With upper extremity assist  from rollator      Ambulation/Gait   Ambulation/Gait Yes   Ambulation/Gait Assistance 5: Supervision;Other (comment)  min guard    Ambulation/Gait Assistance Details requires intermittent min guard due to increased fatigue. Requires intermittent cueing for step length to maintain upright posture    Ambulation Distance (Feet) 310 Feet   Assistive device Rollator   Gait Pattern Step-through pattern;Decreased stride length;Trendelenburg;Lateral hip instability;Lateral trunk lean to left;Trunk flexed;Narrow base of support   Ambulation Surface Level;Indoor   Stairs --   [employer] --   Stair Management Technique --   Number of Stairs --   Height of Stairs --     Knee/Hip Exercises: Stretches   Other Knee/Hip Stretches PT positioned patient in L sidelying. PT performed foam rolling to R ITB gradually increasing pressure. Patient reported a decrease in pain following foam rolling.     Knee/Hip Exercises: Standing    Hip Abduction Stengthening;Right  3 reps   Abduction Limitations Patient stood on bottom step of stairs. Patient's L foot over edge of step (not supported), and patient cued to isometrically activate glut med to hold pelvis level maintaining LLE position. Patient performed 3 reps of a 60s hold with a standing rest break between each set. PT progressed exercise to concentric R glut med contractions via lowering L foot to ground from bottom step position then return to starting position. Patient completed 5 reps then required seated rest break.                  PT Short Term Goals - 05/31/16 1024      PT SHORT TERM GOAL #1   Title Will improve five time sit to stand time to 12 seconds without use of UE for improved functional LE strength (TARGET DATE: all STGs now due 06/04/2016 due to missed therapy due to cellulitis)    Baseline 14 seconds without UE; 9.47 seconds on 05/31/2016   Status Achieved     PT SHORT TERM GOAL #2   Title Will decrease falls risk as indicated by decrease in TUG score <15 seconds without RW    Baseline 18.63 without RW; 12.94 seconds without RW on 05/31/2016   Status Achieved     PT SHORT TERM GOAL #3   Title Will ambulate up/down 8 stairs with one rail with alternating method and Mod I   Baseline supervision with 2 rails; supervision with one rail and alternating sequence to ascend, bilat UE support and step to sequence to descend due to RLE buckling today, partially met   Status Partially Met     PT SHORT TERM GOAL #4   Title Pt will perform gait on level, indoor surfaces without RW x 150' with supervision   Baseline Min A without RW; supervision x 230' on indoor, level surface without RW   Status Achieved     PT SHORT TERM GOAL #5   Title Will perform BERG to assess balance deficits and LTG to be set   Baseline Goal met 04/28/16; LTG set   Status Achieved           PT Long Term Goals - 06/03/16 1204      PT LONG TERM GOAL #1   Title Will  improve hip and knee flexion strength to 5/5 bilaterally and will perform 5 times sit to stand in <12 seconds without UE support (TARGET DATE FOR ALL LTG 06/20/16)   Baseline met on 05/31/16   Status Achieved  PT LONG TERM GOAL #2   Title Pt will improve safety with gait as indicated by increase in 10 meter walk test/gait velocity to >2.62 ft/sec without AD   Baseline 2.12 ft/sec   Time 8   Period Weeks   Status On-going     PT LONG TERM GOAL #3   Title Will decrease falls risk as indicated by a TUG score of <13 seconds without RW    Baseline achieved 05/31/16   Status Achieved     PT LONG TERM GOAL #4   Title Will perform HEP independently   Time 8   Period Weeks   Status On-going     PT LONG TERM GOAL #5   Title Will ambulate over uneven outdoor surfaces x 500 ft. and up/down 8 stairs with SPC mod I    Time 8   Period Weeks   Status On-going     PT LONG TERM GOAL #6   Title Pt will decrease falls risk and will improve BERG balance score to >50/56 overall   Baseline 47/56 on 06/03/2016   Time 8   Period Weeks   Status On-going               Plan - 06/09/16 1227    Clinical Impression Statement Today's skilled PT session focused on advancing patient's R hip strengthening exercises and reducing R hip pain. PT performed R ITB foam rolling with gradual increase in pressure, and patient reported a decrease in R hip pain following foam rolling. PT progressed patient's R hip strengthening with isometric and concentric glut med contractions. Patient is making progress towards goals, and will benefit from continued skilled PT to address functional mobility deficits.    Rehab Potential Good   Clinical Impairments Affecting Rehab Potential 2 year history of LE weakness and steroid use   PT Frequency 2x / week   PT Duration 8 weeks   PT Treatment/Interventions ADLs/Self Care Home Management;Gait training;Stair training;Functional mobility training;DME Instruction;Therapeutic  activities;Therapeutic exercise;Balance training;Neuromuscular re-education;Patient/family education;Passive range of motion;Taping   PT Next Visit Plan R ITB foam rolling to reduce pain in R hip (if indicated), incorporate line dancing moves with balance activities    Consulted and Agree with Plan of Care Patient      Patient will benefit from skilled therapeutic intervention in order to improve the following deficits and impairments:  Abnormal gait, Decreased balance, Decreased strength, Difficulty walking, Decreased endurance  Visit Diagnosis: Muscle weakness (generalized)  Unsteadiness on feet  Other abnormalities of gait and mobility  Difficulty in walking, not elsewhere classified     Problem List Patient Active Problem List   Diagnosis Date Noted  . SIRS (systemic inflammatory response syndrome) (HCC)   . Weakness of both lower extremities   . Arthritis   . Sepsis secondary to UTI ([place]) 03/18/2015  . Gout attack 10/23/2014  . Toe effusion 05/24/2014  . Anemia in neoplastic disease 04/02/2014  . CKD (chronic kidney disease), stage III 04/02/2014  . Wound infection 03/18/2014  . Cellulitis 03/18/2014  . Acute renal failure ([place]) 03/18/2014  . Leg edema, right   . Open wound of leg 02/22/2014  . Breast cancer, right breast ([place]) 05/13/2011  . Incisional hernia, incarcerated, right upper quadrant 07/13/2010  . HEADACHE 11/06/2009  . CONTACT DERMATITIS 06/13/2009  . FATIGUE 06/13/2009  . EDEMA 05/01/2008  . BACK PAIN 03/29/2008  . CAROTID STENOSIS 03/16/2008  . ANXIETY 11/14/2007  . MENOPAUSAL SYNDROME 09/27/2007  . GLUCOSE INTOLERANCE 08/10/2007  .  HYPERLIPIDEMIA 08/10/2007  . PERIPHERAL VASCULAR DISEASE 08/10/2007  . Venous (peripheral) insufficiency 08/10/2007  . Vitamin D deficiency 12/26/2006  . OBESITY 12/26/2006  . DUPUYTREN'S CONTRACTURE 12/26/2006  . Hypothyroidism 11/05/2006  . OSTEOARTHRITIS 11/05/2006    Arelia Sneddon, SPT  06/09/2016, 12:30  PM  [facility] [address] [place], [place], [address] Phone: [phone]   Fax:  [phone]  Name: Rachael [name] MRN: [id number] Date of Birth: 06/25/1933

## 2016-06-10 ENCOUNTER — Ambulatory Visit: Payer: PPO | Admitting: Physical Therapy

## 2016-06-14 ENCOUNTER — Ambulatory Visit: Payer: PPO | Attending: Rheumatology | Admitting: Physical Therapy

## 2016-06-14 ENCOUNTER — Encounter: Payer: Self-pay | Admitting: Physical Therapy

## 2016-06-14 DIAGNOSIS — R2681 Unsteadiness on feet: Secondary | ICD-10-CM

## 2016-06-14 DIAGNOSIS — M6281 Muscle weakness (generalized): Secondary | ICD-10-CM

## 2016-06-14 DIAGNOSIS — R2689 Other abnormalities of gait and mobility: Secondary | ICD-10-CM | POA: Diagnosis not present

## 2016-06-14 DIAGNOSIS — R262 Difficulty in walking, not elsewhere classified: Secondary | ICD-10-CM | POA: Diagnosis not present

## 2016-06-14 NOTE — Therapy (Signed)
[facility] [address] [place], [place], [address] Phone: [phone]   Fax:  [phone]  Physical Therapy Treatment  Patient Details  Name: Rachael [name] MRN: [id number] Date of Birth: [birth year] Referring Provider: Lahoma Rocker, MD  Encounter Date: 06/14/2016      PT End of Session - 06/14/16 1112    Visit Number 12   Number of Visits 17   Date for PT Re-Evaluation 06/20/16   Authorization Type [employer] G Code every 10th visit   PT Start Time 1015   PT Stop Time 1100   PT Time Calculation (min) 45 min   Equipment Utilized During Treatment Gait belt   Activity Tolerance Patient tolerated treatment well   Behavior During Therapy [facility] for tasks assessed/performed      Past Medical History:  Diagnosis Date  . Breast cancer (HCC)    R  . CAROTID STENOSIS   . Chronic venous insufficiency   . CONTACT DERMATITIS   . DUPUYTREN'S CONTRACTURE   . Edema    pt  states had edema of feet and legs has been on lasix per dr. to help  . GLUCOSE INTOLERANCE   . HLD (hyperlipidemia)   . Hypothyroidism    Dr. Ala Dach in [place]   . MENOPAUSAL SYNDROME   . Obesity   . Osteoarthritis    bilat hips, severe  . PVD (peripheral vascular disease) ([place])    mild carotid 11/05  . VITAMIN D DEFICIENCY     Past Surgical History:  Procedure Laterality Date  . 2D echo  11/29/03   normal LV size. normal EF   . [place]   lumbarectomy  . BREAST BIOPSY  05/11/11   right breast  . BREAST LUMPECTOMY     right breast  . CHOLECYSTECTOMY    . FINGER SURGERY    . HERNIA REPAIR     incisional hernia  . INGUINAL HERNIA REPAIR    . JOINT REPLACEMENT Bilateral    Dr. Maureen Ralphs  . normal caronary arteries     per pt. 2003  . right rotator cuff surgery    . TONSILLECTOMY    . TOTAL HIP ARTHROPLASTY  05/2008 and 10/2008   B hip - alusio    There were no vitals filed for this visit.      Subjective Assessment  - 06/14/16 1020    Subjective Patient reports being tired today and fatigued, which she reports may be partially due to having family over last night and preparing a large meal. Patient reports soreness in her buttocks, but her knees are still feeling better. Patient reports Friday and Saturday (06/11/2016 & 06/12/2016) she was unable to leave the house due to "heavy legs" and "my legs wouldn't work". Patient presents to PT on rollator today, and reports she feels she is making slow progress.    Pertinent History 2013 breast CA with lumpectomy, h/o bilat THA, hypothyroid, PVD   Limitations Walking;House hold activities   Patient Stated Goals Walk normally again and not use AD   Currently in Pain? No/denies   Pain Onset In the past 7 days            [facility] PT Assessment - 06/14/16 1015      Standardized Balance Assessment   Standardized Balance Assessment Timed Up and Go Test   10 Meter Walk 19.44 seconds or 1.37f/s     Berg Balance Test   Sit to Stand Able to stand without  using hands and stabilize independently   Standing Unsupported Able to stand safely 2 minutes   Sitting with Back Unsupported but Feet Supported on Floor or Stool Able to sit safely and securely 2 minutes   Stand to Sit Sits safely with minimal use of hands   Transfers Able to transfer safely, definite need of hands   Standing Unsupported with Eyes Closed Able to stand 10 seconds safely   Standing Ubsupported with Feet Together Able to place feet together independently and stand for 1 minute with supervision   From Standing, Reach Forward with Outstretched Arm Can reach confidently >25 cm (10")   From Standing Position, Pick up Object from Floor Able to pick up shoe safely and easily   From Standing Position, Turn to Look Behind Over each Shoulder Looks behind one side only/other side shows less weight shift   Turn 360 Degrees Able to turn 360 degrees safely one side only in 4 seconds or less   Standing Unsupported,  Alternately Place Feet on Step/Stool Able to complete >2 steps/needs minimal assist   Standing Unsupported, One Foot in Front Able to plae foot ahead of the other independently and hold 30 seconds   Standing on One Leg Tries to lift leg/unable to hold 3 seconds but remains standing independently   Total Score 45     Timed Up and Go Test   TUG Normal TUG   Normal TUG (seconds) 22.57                     [facility] Adult PT Treatment/Exercise - 06/14/16 1015      Transfers   Transfers Sit to Stand;Stand to Sit   Sit to Stand 5: Supervision;From bed  to rollator    Stand to Sit 5: Supervision;To bed;With upper extremity assist  from rollator      Ambulation/Gait   Ambulation/Gait Yes   Ambulation/Gait Assistance 5: Supervision   Ambulation Distance (Feet) --   Assistive device Rollator   Gait Pattern Step-through pattern;Decreased stride length;Trendelenburg;Lateral hip instability;Lateral trunk lean to left;Trunk flexed;Narrow base of support   Ambulation Surface Level;Indoor     Knee/Hip Exercises: Stretches   Other Knee/Hip Stretches PT positioned patient in L sidelying with pillow between legs. PT performed foam rolling to R ITB gradually increasing pressure. Patient reported a decrease in pain following treatment.     Knee/Hip Exercises: Standing   Hip Abduction --   Abduction Limitations --                PT Education - 06/14/16 1111    Education provided Yes   Education Details plan of care; results of functional outcome measures    Person(s) Educated Patient   Methods Explanation   Comprehension Verbalized understanding          PT Short Term Goals - 05/31/16 1024      PT SHORT TERM GOAL #1   Title Will improve five time sit to stand time to 12 seconds without use of UE for improved functional LE strength (TARGET DATE: all STGs now due 06/04/2016 due to missed therapy due to cellulitis)    Baseline 14 seconds without UE; 9.47 seconds on 05/31/2016    Status Achieved     PT SHORT TERM GOAL #2   Title Will decrease falls risk as indicated by decrease in TUG score <15 seconds without RW    Baseline 18.63 without RW; 12.94 seconds without RW on 05/31/2016   Status Achieved  PT SHORT TERM GOAL #3   Title Will ambulate up/down 8 stairs with one rail with alternating method and Mod I   Baseline supervision with 2 rails; supervision with one rail and alternating sequence to ascend, bilat UE support and step to sequence to descend due to RLE buckling today, partially met   Status Partially Met     PT SHORT TERM GOAL #4   Title Pt will perform gait on level, indoor surfaces without RW x 150' with supervision   Baseline Min A without RW; supervision x 230' on indoor, level surface without RW   Status Achieved     PT SHORT TERM GOAL #5   Title Will perform BERG to assess balance deficits and LTG to be set   Baseline Goal met 04/28/16; LTG set   Status Achieved           PT Long Term Goals - 06/14/16 1027      PT LONG TERM GOAL #1   Title --   Baseline --   Status --     PT LONG TERM GOAL #2   Title Pt will improve safety with gait as indicated by increase in 10 meter walk test/gait velocity to >2.62 ft/sec without AD   Baseline On-going (06/14/2016): gait velocity = 1.34f/s without AD    Time 8   Period Weeks   Status On-going     PT LONG TERM GOAL #3   Title Will decrease falls risk as indicated by a TUG score of <13 seconds without RW    Baseline 22 seconds on 06/14/16-goal reinstated due to decline in function after steroid taper   Time 4   Period Weeks   Status Revised     PT LONG TERM GOAL #4   Title Will perform HEP independently   Time 8   Period Weeks   Status On-going     PT LONG TERM GOAL #5   Title Will ambulate over uneven outdoor surfaces x 500 ft. and up/down 8 stairs with SPC mod I    Time 8   Period Weeks   Status On-going     PT LONG TERM GOAL #6   Title Pt will decrease falls risk and will  improve BERG balance score to >50/56 overall   Baseline On-going (06/14/2016) - Berg Balance score = 45/56    Time 8   Period Weeks   Status On-going               Plan - 06/14/16 1122    Clinical Impression Statement Today's skilled PT session focused on beginning to assess patient's long term goals. Patient is making progress, but has not met all long term goals assessed by PT due to fatigue and pain that appears to be directly associated with patient's prednisone taper. After beginning to assess patient's long term goals, PT performed R ITB foam rolling to reduce R hip and lateral leg pain. Patient reported she "felt better" following foam rolling. Patient is making progress towards goals, and will benefit from continued skilled PT to address functional mobility deficits.      Rehab Potential Good   Clinical Impairments Affecting Rehab Potential 2 year history of LE weakness and steroid use   PT Frequency 2x / week   PT Duration 8 weeks   PT Treatment/Interventions ADLs/Self Care Home Management;Gait training;Stair training;Functional mobility training;DME Instruction;Therapeutic activities;Therapeutic exercise;Balance training;Neuromuscular re-education;Patient/family education;Passive range of motion;Taping   PT Next Visit Plan assess remaining LTGs; recertify as indicated; incorporate  line dancing moves with balance activities    Consulted and Agree with Plan of Care Patient      Patient will benefit from skilled therapeutic intervention in order to improve the following deficits and impairments:  Abnormal gait, Decreased balance, Decreased strength, Difficulty walking, Decreased endurance  Visit Diagnosis: Muscle weakness (generalized)  Unsteadiness on feet  Other abnormalities of gait and mobility  Difficulty in walking, not elsewhere classified     Problem List Patient Active Problem List   Diagnosis Date Noted  . SIRS (systemic inflammatory response syndrome) (HCC)    . Weakness of both lower extremities   . Arthritis   . Sepsis secondary to UTI ([place]) 03/18/2015  . Gout attack 10/23/2014  . Toe effusion 05/24/2014  . Anemia in neoplastic disease 04/02/2014  . CKD (chronic kidney disease), stage III 04/02/2014  . Wound infection 03/18/2014  . Cellulitis 03/18/2014  . Acute renal failure ([place]) 03/18/2014  . Leg edema, right   . Open wound of leg 02/22/2014  . Breast cancer, right breast ([place]) 05/13/2011  . Incisional hernia, incarcerated, right upper quadrant 07/13/2010  . HEADACHE 11/06/2009  . CONTACT DERMATITIS 06/13/2009  . FATIGUE 06/13/2009  . EDEMA 05/01/2008  . BACK PAIN 03/29/2008  . CAROTID STENOSIS 03/16/2008  . ANXIETY 11/14/2007  . MENOPAUSAL SYNDROME 09/27/2007  . GLUCOSE INTOLERANCE 08/10/2007  . HYPERLIPIDEMIA 08/10/2007  . PERIPHERAL VASCULAR DISEASE 08/10/2007  . Venous (peripheral) insufficiency 08/10/2007  . Vitamin D deficiency 12/26/2006  . OBESITY 12/26/2006  . DUPUYTREN'S CONTRACTURE 12/26/2006  . Hypothyroidism 11/05/2006  . OSTEOARTHRITIS 11/05/2006    Arelia Sneddon, SPT  06/14/2016, 11:24 AM  [facility] [address] [place], [place], [address] Phone: [phone]   Fax:  [phone]  Name: Rachael [name] MRN: [id number] Date of Birth: Apr 04, 1933

## 2016-06-17 ENCOUNTER — Ambulatory Visit: Payer: PPO | Admitting: Physical Therapy

## 2016-06-17 ENCOUNTER — Encounter: Payer: Self-pay | Admitting: Physical Therapy

## 2016-06-17 DIAGNOSIS — R262 Difficulty in walking, not elsewhere classified: Secondary | ICD-10-CM

## 2016-06-17 DIAGNOSIS — R2681 Unsteadiness on feet: Secondary | ICD-10-CM

## 2016-06-17 DIAGNOSIS — R2689 Other abnormalities of gait and mobility: Secondary | ICD-10-CM

## 2016-06-17 DIAGNOSIS — M6281 Muscle weakness (generalized): Secondary | ICD-10-CM | POA: Diagnosis not present

## 2016-06-17 NOTE — Therapy (Signed)
[facility] [address] [place], [place], [address] Phone: [phone]   Fax:  [phone]  Physical Therapy Treatment  Patient Details  Name: Rachael [name] MRN: [id number] Date of Birth: 15-Jul-1933 Referring Provider: Lahoma Rocker, MD  Encounter Date: 06/17/2016      PT End of Session - 06/17/16 1203    Visit Number 13   Number of Visits 30  per recertification   Date for PT Re-Evaluation 08/16/16  BUT WILL REASSESS LTG AT WEEK 4   Authorization Type [employer] G Code every 10th visit   PT Start Time 1020   PT Stop Time 1100   PT Time Calculation (min) 40 min   Activity Tolerance Patient tolerated treatment well   Behavior During Therapy [facility] for tasks assessed/performed      Past Medical History:  Diagnosis Date  . Breast cancer (HCC)    R  . CAROTID STENOSIS   . Chronic venous insufficiency   . CONTACT DERMATITIS   . DUPUYTREN'S CONTRACTURE   . Edema    pt  states had edema of feet and legs has been on lasix per dr. to help  . GLUCOSE INTOLERANCE   . HLD (hyperlipidemia)   . Hypothyroidism    Dr. Ala Dach in [place]   . MENOPAUSAL SYNDROME   . Obesity   . Osteoarthritis    bilat hips, severe  . PVD (peripheral vascular disease) ([place])    mild carotid 11/05  . VITAMIN D DEFICIENCY     Past Surgical History:  Procedure Laterality Date  . 2D echo  11/29/03   normal LV size. normal EF   . [place]   lumbarectomy  . BREAST BIOPSY  05/11/11   right breast  . BREAST LUMPECTOMY     right breast  . CHOLECYSTECTOMY    . FINGER SURGERY    . HERNIA REPAIR     incisional hernia  . INGUINAL HERNIA REPAIR    . JOINT REPLACEMENT Bilateral    Dr. Maureen Ralphs  . normal caronary arteries     per pt. 2003  . right rotator cuff surgery    . TONSILLECTOMY    . TOTAL HIP ARTHROPLASTY  05/2008 and 10/2008   B hip - alusio    There were no vitals filed for this visit.      Subjective  Assessment - 06/17/16 1027    Subjective No change in strength and balance from 3 days ago, no worse but no improvement either.  Continues to perform exercises and try to ambulate without RW short distances.  Pain in R hip is improved.   Pertinent History 2013 breast CA with lumpectomy, h/o bilat THA, hypothyroid, PVD   Limitations Walking;House hold activities   Patient Stated Goals Walk normally again and not use AD   Currently in Pain? No/denies            [facility] PT Assessment - 06/17/16 1048      Ambulation/Gait   Ambulation/Gait Yes   Ambulation/Gait Assistance 4: Min guard   Ambulation Distance (Feet) 1000 Feet   Assistive device Straight cane   Gait Pattern Step-through pattern;Decreased arm swing - right;Decreased arm swing - left;Decreased step length - left;Decreased step length - right;Decreased stride length;Decreased trunk rotation;Trunk flexed   Ambulation Surface Unlevel;Outdoor;Paved   Stairs Yes   Stairs Assistance 5: Supervision   Stairs Assistance Details (indicate cue type and reason) Switches cane to RUE, rail in LUE to descend  Stair Management Technique One rail Right;Alternating pattern;Forwards;With cane   Number of Stairs 12   Height of Stairs 6                             PT Education - 06/17/16 1203    Education provided Yes   Education Details LTG achievement, areas of impairment to continue to address with recertification   Person(s) Educated Patient   Methods Explanation   Comprehension Verbalized understanding          PT Short Term Goals - 05/31/16 1024      PT SHORT TERM GOAL #1   Title Will improve five time sit to stand time to 12 seconds without use of UE for improved functional LE strength (TARGET DATE: all STGs now due 06/04/2016 due to missed therapy due to cellulitis)    Baseline 14 seconds without UE; 9.47 seconds on 05/31/2016   Status Achieved     PT SHORT TERM GOAL #2   Title Will decrease falls risk as  indicated by decrease in TUG score <15 seconds without RW    Baseline 18.63 without RW; 12.94 seconds without RW on 05/31/2016   Status Achieved     PT SHORT TERM GOAL #3   Title Will ambulate up/down 8 stairs with one rail with alternating method and Mod I   Baseline supervision with 2 rails; supervision with one rail and alternating sequence to ascend, bilat UE support and step to sequence to descend due to RLE buckling today, partially met   Status Partially Met     PT SHORT TERM GOAL #4   Title Pt will perform gait on level, indoor surfaces without RW x 150' with supervision   Baseline Min A without RW; supervision x 230' on indoor, level surface without RW   Status Achieved     PT SHORT TERM GOAL #5   Title Will perform BERG to assess balance deficits and LTG to be set   Baseline Goal met 04/28/16; LTG set   Status Achieved           PT Long Term Goals - 06/17/16 1206      PT LONG TERM GOAL #2   Title (NEW TARGET DATE FOR REMAINING LTG IS 07/17/2016) Pt will improve safety with gait as indicated by increase in 10 meter walk test/gait velocity to >2.62 ft/sec without AD   Baseline (06/14/2016): gait velocity = 1.49f/s without AD    Time 4   Period Weeks   Status On-going     PT LONG TERM GOAL #3   Title Will decrease falls risk as indicated by a TUG score of <13 seconds without RW (07/17/16)   Baseline 22 seconds on 06/14/16-goal reinstated due to decline in function after steroid taper   Time 4   Period Weeks   Status On-going     PT LONG TERM GOAL #4   Title Will perform HEP independently (07/17/16)   Time 4   Period Weeks   Status On-going     PT LONG TERM GOAL #5   Title Will ambulate over uneven outdoor surfaces x 500 ft. and up/down 8 stairs with SPC and R rail MOD I (07/17/16)   Baseline 1,000 outdoors on uneven pavement with SPC with min guard, 8 stairs with SPC and rail with supervision   Time 4   Period Weeks   Status On-going     PT LONG TERM GOAL #6  Title  Pt will decrease falls risk and will improve BERG balance score to >50/56 overall (07/17/16)   Baseline On-going (06/14/2016) - Berg Balance score = 45/56    Time 4   Period Weeks   Status On-going               Plan - 06/17/16 1211    Clinical Impression Statement Treatment session today focused on reassessment of rest of LTG including gait outdoors over uneven pavement and stair negotiation.  Pt continues to make steady progress despite functional set back after steroid taper and continues to be motivated to make functional improvements; progress made towards all goals but no goals fully met as of 06/17/2016.  Pt would benefit from continued PT services to continue to address impairments in LE strength, ROM, endurance, pain, posture, balance, gait and to decrease falls risk as pt continues to taper down on steroids.  Pt agreeable to continue with therapy.   Rehab Potential Good   Clinical Impairments Affecting Rehab Potential 2 year history of LE weakness and steroid use   PT Frequency 2x / week   PT Duration 8 weeks  will reassess at 4 weeks   PT Treatment/Interventions ADLs/Self Care Home Management;Gait training;Stair training;Functional mobility training;DME Instruction;Therapeutic activities;Therapeutic exercise;Balance training;Neuromuscular re-education;Patient/family education;Passive range of motion;Taping   PT Next Visit Plan continue LE strengthening and stretching, gait with SPC outside and during stair negotiation; line dancing for balance.  RECERT COMPLETED FOR 8 WEEKS BUT WILL REASSESS AT 07/17/16 AND DECIDE TO D/C OR CONTINUE X 4 MORE WEEKS TO 08/16/16.   Consulted and Agree with Plan of Care Patient      Patient will benefit from skilled therapeutic intervention in order to improve the following deficits and impairments:  Abnormal gait, Decreased balance, Decreased strength, Difficulty walking, Decreased endurance  Visit Diagnosis: Muscle weakness (generalized)  Difficulty  in walking, not elsewhere classified  Other abnormalities of gait and mobility  Unsteadiness on feet     Problem List Patient Active Problem List   Diagnosis Date Noted  . SIRS (systemic inflammatory response syndrome) (HCC)   . Weakness of both lower extremities   . Arthritis   . Sepsis secondary to UTI ([place]) 03/18/2015  . Gout attack 10/23/2014  . Toe effusion 05/24/2014  . Anemia in neoplastic disease 04/02/2014  . CKD (chronic kidney disease), stage III 04/02/2014  . Wound infection 03/18/2014  . Cellulitis 03/18/2014  . Acute renal failure ([place]) 03/18/2014  . Leg edema, right   . Open wound of leg 02/22/2014  . Breast cancer, right breast ([place]) 05/13/2011  . Incisional hernia, incarcerated, right upper quadrant 07/13/2010  . HEADACHE 11/06/2009  . CONTACT DERMATITIS 06/13/2009  . FATIGUE 06/13/2009  . EDEMA 05/01/2008  . BACK PAIN 03/29/2008  . CAROTID STENOSIS 03/16/2008  . ANXIETY 11/14/2007  . MENOPAUSAL SYNDROME 09/27/2007  . GLUCOSE INTOLERANCE 08/10/2007  . HYPERLIPIDEMIA 08/10/2007  . PERIPHERAL VASCULAR DISEASE 08/10/2007  . Venous (peripheral) insufficiency 08/10/2007  . Vitamin D deficiency 12/26/2006  . OBESITY 12/26/2006  . DUPUYTREN'S CONTRACTURE 12/26/2006  . Hypothyroidism 11/05/2006  . OSTEOARTHRITIS 11/05/2006   Raylene Everts, PT, DPT 06/17/16    12:18 PM    [facility] [address] [place], [place], [address] Phone: [phone]   Fax:  [phone]  Name: Rachael [name] MRN: [id number] Date of Birth: [birth year]

## 2016-06-25 ENCOUNTER — Encounter: Payer: Self-pay | Admitting: Physical Therapy

## 2016-06-25 ENCOUNTER — Ambulatory Visit: Payer: PPO | Admitting: Physical Therapy

## 2016-06-25 DIAGNOSIS — M6281 Muscle weakness (generalized): Secondary | ICD-10-CM

## 2016-06-25 DIAGNOSIS — R2681 Unsteadiness on feet: Secondary | ICD-10-CM

## 2016-06-25 DIAGNOSIS — R2689 Other abnormalities of gait and mobility: Secondary | ICD-10-CM

## 2016-06-25 DIAGNOSIS — R262 Difficulty in walking, not elsewhere classified: Secondary | ICD-10-CM

## 2016-06-25 NOTE — Therapy (Signed)
[facility] [address] [place], [place], [address] Phone: [phone]   Fax:  [phone]  Physical Therapy Treatment  Patient Details  Name: Rachael [name] MRN: [id number] Date of Birth: 03-Nov-1933 Referring Provider: Lahoma Rocker, MD  Encounter Date: 06/25/2016      PT End of Session - 06/25/16 1157    Visit Number 14   Number of Visits 30  per recertification   Date for PT Re-Evaluation 08/16/16  BUT WILL REASSESS LTG AT WEEK 4   Authorization Type [employer] G Code every 10th visit   PT Start Time 0803   PT Stop Time 0845   PT Time Calculation (min) 42 min   Equipment Utilized During Treatment Gait belt   Activity Tolerance Patient tolerated treatment well   Behavior During Therapy [facility] for tasks assessed/performed      Past Medical History:  Diagnosis Date  . Breast cancer (HCC)    R  . CAROTID STENOSIS   . Chronic venous insufficiency   . CONTACT DERMATITIS   . DUPUYTREN'S CONTRACTURE   . Edema    pt  states had edema of feet and legs has been on lasix per dr. to help  . GLUCOSE INTOLERANCE   . HLD (hyperlipidemia)   . Hypothyroidism    Dr. Ala Dach in [place]   . MENOPAUSAL SYNDROME   . Obesity   . Osteoarthritis    bilat hips, severe  . PVD (peripheral vascular disease) ([place])    mild carotid 11/05  . VITAMIN D DEFICIENCY     Past Surgical History:  Procedure Laterality Date  . 2D echo  11/29/03   normal LV size. normal EF   . [place]   lumbarectomy  . BREAST BIOPSY  05/11/11   right breast  . BREAST LUMPECTOMY     right breast  . CHOLECYSTECTOMY    . FINGER SURGERY    . HERNIA REPAIR     incisional hernia  . INGUINAL HERNIA REPAIR    . JOINT REPLACEMENT Bilateral    Dr. Maureen Ralphs  . normal caronary arteries     per pt. 2003  . right rotator cuff surgery    . TONSILLECTOMY    . TOTAL HIP ARTHROPLASTY  05/2008 and 10/2008   B hip - alusio    There were no  vitals filed for this visit.      Subjective Assessment - 06/25/16 0808    Subjective Patient reports she is ambulating in the house without her cane, but presents to PT on her rollator. Patient reports compliance with HEP. Patient denies any falls. Patient denies any pain in her R hip over this past week. Patient leaves tomorrow, and will be out of town for 2 weeks.    Pertinent History 2013 breast CA with lumpectomy, h/o bilat THA, hypothyroid, PVD   Limitations Walking;House hold activities   Patient Stated Goals Walk normally again and not use AD   Currently in Pain? No/denies  "my legs feel fatigued"                          [facility] Adult PT Treatment/Exercise - 06/25/16 0800      Ambulation/Gait   Ambulation/Gait Yes   Ambulation/Gait Assistance 4: Min guard;4: Min assist;3: Mod assist   Ambulation/Gait Assistance Details Requires S on level, firm surfaces, min guard on level grass with intermittent mod A due to missteps. PT required to provide  cueing for foot clearance in grass, and foot placement when transitioning between outdoor surfaces.   Ambulation Distance (Feet) 1000 Feet   Assistive device Straight cane   Gait Pattern Step-through pattern;Decreased arm swing - right;Decreased arm swing - left;Decreased step length - left;Decreased step length - right;Decreased stride length;Decreased trunk rotation;Trunk flexed   Ambulation Surface Level;Unlevel;Indoor;Outdoor;Paved;Grass   Stairs --   Manufacturing systems engineer --   Stair Management Technique --   Number of Stairs --   Height of Stairs --     Neuro Re-ed    Neuro Re-ed Details  Performed cross over stepping/braiding/other line dancing moves to music with PT providing min guard with intermittend min A due to missteps/LOB. Patient danced for approximately 3 minutes prior to requiring a seated rest break.                PT Education - 06/25/16 1156    Education provided Yes   Education Details continue  HEP as much as possible over next two weeks  while out of town with emphasis on maintaining her walking    Person(s) Educated Patient   Methods Explanation   Comprehension Verbalized understanding          PT Short Term Goals - 05/31/16 1024      PT SHORT TERM GOAL #1   Title Will improve five time sit to stand time to 12 seconds without use of UE for improved functional LE strength (TARGET DATE: all STGs now due 06/04/2016 due to missed therapy due to cellulitis)    Baseline 14 seconds without UE; 9.47 seconds on 05/31/2016   Status Achieved     PT SHORT TERM GOAL #2   Title Will decrease falls risk as indicated by decrease in TUG score <15 seconds without RW    Baseline 18.63 without RW; 12.94 seconds without RW on 05/31/2016   Status Achieved     PT SHORT TERM GOAL #3   Title Will ambulate up/down 8 stairs with one rail with alternating method and Mod I   Baseline supervision with 2 rails; supervision with one rail and alternating sequence to ascend, bilat UE support and step to sequence to descend due to RLE buckling today, partially met   Status Partially Met     PT SHORT TERM GOAL #4   Title Pt will perform gait on level, indoor surfaces without RW x 150' with supervision   Baseline Min A without RW; supervision x 230' on indoor, level surface without RW   Status Achieved     PT SHORT TERM GOAL #5   Title Will perform BERG to assess balance deficits and LTG to be set   Baseline Goal met 04/28/16; LTG set   Status Achieved           PT Long Term Goals - 06/17/16 1206      PT LONG TERM GOAL #2   Title (NEW TARGET DATE FOR REMAINING LTG IS 07/17/2016) Pt will improve safety with gait as indicated by increase in 10 meter walk test/gait velocity to >2.62 ft/sec without AD   Baseline (06/14/2016): gait velocity = 1.45f/s without AD    Time 4   Period Weeks   Status On-going     PT LONG TERM GOAL #3   Title Will decrease falls risk as indicated by a TUG score of <13  seconds without RW (07/17/16)   Baseline 22 seconds on 06/14/16-goal reinstated due to decline in function after steroid taper   Time 4  Period Weeks   Status On-going     PT LONG TERM GOAL #4   Title Will perform HEP independently (07/17/16)   Time 4   Period Weeks   Status On-going     PT LONG TERM GOAL #5   Title Will ambulate over uneven outdoor surfaces x 500 ft. and up/down 8 stairs with SPC and R rail MOD I (07/17/16)   Baseline 1,000 outdoors on uneven pavement with SPC with min guard, 8 stairs with SPC and rail with supervision   Time 4   Period Weeks   Status On-going     PT LONG TERM GOAL #6   Title Pt will decrease falls risk and will improve BERG balance score to >50/56 overall (07/17/16)   Baseline On-going (06/14/2016) - Berg Balance score = 45/56    Time 4   Period Weeks   Status On-going               Plan - 06/25/16 1200    Clinical Impression Statement Today's skilled PT session focused on advancing patient's balance activities by utilizing line dancing moves (cross over/braiding/hitch) to music as patient would like to return to routine line dancing classes. PT advanced patient's gait training with SPC including ramp/curb and outdoor surfaces, and patient required increased assistance from PT towards the end of session due to fatigue and foot clearance. Patient denied any pain throughout today's session, but did report fatigue. Patient leaves town tomorrow, and will be gone for 2 weeks. PT educated on HEP and activity modification while away, and plans to reassess and progress patient's balance, strength and gait activities as indicated upon return.    Rehab Potential Good   Clinical Impairments Affecting Rehab Potential 2 year history of LE weakness and steroid use   PT Frequency 2x / week   PT Duration 8 weeks  will reassess at 4 weeks   PT Treatment/Interventions ADLs/Self Care Home Management;Gait training;Stair training;Functional mobility training;DME  Instruction;Therapeutic activities;Therapeutic exercise;Balance training;Neuromuscular re-education;Patient/family education;Passive range of motion;Taping   PT Next Visit Plan begin to assess LTGs (due 07/17/2016); reassess as appropriate as patient will have been out of town for two weeks and progress LE strength and balance as indicated    Consulted and Agree with Plan of Care Patient      Patient will benefit from skilled therapeutic intervention in order to improve the following deficits and impairments:  Abnormal gait, Decreased balance, Decreased strength, Difficulty walking, Decreased endurance  Visit Diagnosis: Muscle weakness (generalized)  Difficulty in walking, not elsewhere classified  Other abnormalities of gait and mobility  Unsteadiness on feet     Problem [name] Patient Active Problem [name]   Diagnosis Date Noted  . SIRS (systemic inflammatory response syndrome) (HCC)   . Weakness of both lower extremities   . Arthritis   . Sepsis secondary to UTI ([place]) 03/18/2015  . Gout attack 10/23/2014  . Toe effusion 05/24/2014  . Anemia in neoplastic disease 04/02/2014  . CKD (chronic kidney disease), stage III 04/02/2014  . Wound infection 03/18/2014  . Cellulitis 03/18/2014  . Acute renal failure ([place]) 03/18/2014  . Leg edema, right   . Open wound of leg 02/22/2014  . Breast cancer, right breast ([place]) 05/13/2011  . Incisional hernia, incarcerated, right upper quadrant 07/13/2010  . HEADACHE 11/06/2009  . CONTACT DERMATITIS 06/13/2009  . FATIGUE 06/13/2009  . EDEMA 05/01/2008  . BACK PAIN 03/29/2008  . CAROTID STENOSIS 03/16/2008  . ANXIETY 11/14/2007  . MENOPAUSAL SYNDROME 09/27/2007  .  GLUCOSE INTOLERANCE 08/10/2007  . HYPERLIPIDEMIA 08/10/2007  . PERIPHERAL VASCULAR DISEASE 08/10/2007  . Venous (peripheral) insufficiency 08/10/2007  . Vitamin D deficiency 12/26/2006  . OBESITY 12/26/2006  . DUPUYTREN'S CONTRACTURE 12/26/2006  . Hypothyroidism 11/05/2006  .  OSTEOARTHRITIS 11/05/2006    Arelia Sneddon, SPT  06/25/2016, 12:04 PM  [facility] [address] [place], [place], [address] Phone: [phone]   Fax:  [phone]  Name: CINDE EBERT MRN: [id number] Date of Birth: 1933/12/09

## 2016-07-12 ENCOUNTER — Ambulatory Visit: Payer: PPO | Admitting: Physical Therapy

## 2016-07-16 ENCOUNTER — Telehealth: Payer: Self-pay | Admitting: Physical Therapy

## 2016-07-16 ENCOUNTER — Ambulatory Visit: Payer: PPO | Attending: Rheumatology | Admitting: Physical Therapy

## 2016-07-16 DIAGNOSIS — R262 Difficulty in walking, not elsewhere classified: Secondary | ICD-10-CM | POA: Insufficient documentation

## 2016-07-16 DIAGNOSIS — M6281 Muscle weakness (generalized): Secondary | ICD-10-CM | POA: Insufficient documentation

## 2016-07-16 DIAGNOSIS — R2681 Unsteadiness on feet: Secondary | ICD-10-CM | POA: Insufficient documentation

## 2016-07-16 DIAGNOSIS — R2689 Other abnormalities of gait and mobility: Secondary | ICD-10-CM | POA: Insufficient documentation

## 2016-07-16 NOTE — Telephone Encounter (Signed)
Called pt about missed apt today and to follow up as this was her last scheduled apt with PT. Requested pt call clinic about how she is doing and if she plans to return. Left clinic number for reference.   Willow Ora, PTA, [facility] [address] [place], [place] [address] [phone] 07/16/16, 11:23 AM

## 2016-07-19 DIAGNOSIS — L728 Other follicular cysts of the skin and subcutaneous tissue: Secondary | ICD-10-CM | POA: Diagnosis not present

## 2016-07-19 DIAGNOSIS — L821 Other seborrheic keratosis: Secondary | ICD-10-CM | POA: Diagnosis not present

## 2016-07-19 DIAGNOSIS — D225 Melanocytic nevi of trunk: Secondary | ICD-10-CM | POA: Diagnosis not present

## 2016-07-19 DIAGNOSIS — L814 Other melanin hyperpigmentation: Secondary | ICD-10-CM | POA: Diagnosis not present

## 2016-07-19 DIAGNOSIS — D1801 Hemangioma of skin and subcutaneous tissue: Secondary | ICD-10-CM | POA: Diagnosis not present

## 2016-07-19 DIAGNOSIS — L820 Inflamed seborrheic keratosis: Secondary | ICD-10-CM | POA: Diagnosis not present

## 2016-07-19 DIAGNOSIS — Z85828 Personal history of other malignant neoplasm of skin: Secondary | ICD-10-CM | POA: Diagnosis not present

## 2016-07-19 DIAGNOSIS — L918 Other hypertrophic disorders of the skin: Secondary | ICD-10-CM | POA: Diagnosis not present

## 2016-07-20 DIAGNOSIS — T380X5A Adverse effect of glucocorticoids and synthetic analogues, initial encounter: Secondary | ICD-10-CM | POA: Diagnosis not present

## 2016-07-20 DIAGNOSIS — G720 Drug-induced myopathy: Secondary | ICD-10-CM | POA: Diagnosis not present

## 2016-07-20 DIAGNOSIS — R531 Weakness: Secondary | ICD-10-CM | POA: Diagnosis not present

## 2016-07-20 DIAGNOSIS — M6281 Muscle weakness (generalized): Secondary | ICD-10-CM | POA: Diagnosis not present

## 2016-07-20 DIAGNOSIS — Z7952 Long term (current) use of systemic steroids: Secondary | ICD-10-CM | POA: Diagnosis not present

## 2016-07-21 DIAGNOSIS — H52223 Regular astigmatism, bilateral: Secondary | ICD-10-CM | POA: Diagnosis not present

## 2016-07-21 DIAGNOSIS — H5202 Hypermetropia, left eye: Secondary | ICD-10-CM | POA: Diagnosis not present

## 2016-07-21 DIAGNOSIS — H3540 Unspecified peripheral retinal degeneration: Secondary | ICD-10-CM | POA: Diagnosis not present

## 2016-07-21 DIAGNOSIS — H25813 Combined forms of age-related cataract, bilateral: Secondary | ICD-10-CM | POA: Diagnosis not present

## 2016-07-21 DIAGNOSIS — H5211 Myopia, right eye: Secondary | ICD-10-CM | POA: Diagnosis not present

## 2016-07-22 ENCOUNTER — Ambulatory Visit: Payer: PPO | Admitting: Physical Therapy

## 2016-07-22 DIAGNOSIS — M6281 Muscle weakness (generalized): Secondary | ICD-10-CM

## 2016-07-22 DIAGNOSIS — R262 Difficulty in walking, not elsewhere classified: Secondary | ICD-10-CM | POA: Diagnosis not present

## 2016-07-22 DIAGNOSIS — R2681 Unsteadiness on feet: Secondary | ICD-10-CM

## 2016-07-22 DIAGNOSIS — R2689 Other abnormalities of gait and mobility: Secondary | ICD-10-CM

## 2016-07-22 NOTE — Therapy (Signed)
[facility] [address] [place], [place], [address] Phone: [phone]   Fax:  [phone]  Physical Therapy Treatment  Patient Details  Name: Rachael [name] MRN: [id number] Date of Birth: 05-04-1933 Referring Provider: Lahoma Rocker, MD  Encounter Date: 07/22/2016      PT End of Session - 07/22/16 1700    Visit Number 15   Number of Visits 30   Date for PT Re-Evaluation 08/16/16   Authorization Type [employer] G Code every 10th visit   PT Start Time 1532   PT Stop Time 1615   PT Time Calculation (min) 43 min   Equipment Utilized During Treatment Gait belt   Activity Tolerance Patient tolerated treatment well   Behavior During Therapy [facility] for tasks assessed/performed      Past Medical History:  Diagnosis Date  . Breast cancer (HCC)    R  . CAROTID STENOSIS   . Chronic venous insufficiency   . CONTACT DERMATITIS   . DUPUYTREN'S CONTRACTURE   . Edema    pt  states had edema of feet and legs has been on lasix per dr. to help  . GLUCOSE INTOLERANCE   . HLD (hyperlipidemia)   . Hypothyroidism    Dr. Ala Dach in [place]   . MENOPAUSAL SYNDROME   . Obesity   . Osteoarthritis    bilat hips, severe  . PVD (peripheral vascular disease) ([place])    mild carotid 11/05  . VITAMIN D DEFICIENCY     Past Surgical History:  Procedure Laterality Date  . 2D echo  11/29/03   normal LV size. normal EF   . [place]   lumbarectomy  . BREAST BIOPSY  05/11/11   right breast  . BREAST LUMPECTOMY     right breast  . CHOLECYSTECTOMY    . FINGER SURGERY    . HERNIA REPAIR     incisional hernia  . INGUINAL HERNIA REPAIR    . JOINT REPLACEMENT Bilateral    Dr. Maureen Ralphs  . normal caronary arteries     per pt. 2003  . right rotator cuff surgery    . TONSILLECTOMY    . TOTAL HIP ARTHROPLASTY  05/2008 and 10/2008   B hip - alusio    There were no vitals filed for this visit.      Subjective  Assessment - 07/22/16 1529    Subjective Patient reports she was doing really well for the past 2 1/2 weeks visiting her grandchildren, going up and down 10+ stairs regularly, going outside up and down inclines and uneven surfaces with no AD or difficulty; 2 days after she returned home she felt very unsteady like she had "rubber legs."  It was at this time her steroids were tapered down as well (2.5 mg drop). She presented to PT tx with rollator due to feeling unsteady.  She said she has been walking around the house mostly without an AD, because she has counters or surfaces within reach if needed and using the rollator when going outside the house since Tuesday.   Pertinent History 2013 breast CA with lumpectomy, h/o bilat THA, hypothyroid, PVD   Limitations Walking;House hold activities   Patient Stated Goals Walk normally again and not use AD   Currently in Pain? Other (Comment)            [facility] PT Assessment - 07/22/16 1624      Berg Balance Test   Sit to Stand Able to stand  without using hands and stabilize independently   Standing Unsupported Able to stand 2 minutes with supervision   Sitting with Back Unsupported but Feet Supported on Floor or Stool Able to sit safely and securely 2 minutes   Stand to Sit Controls descent by using hands   Transfers Able to transfer safely, minor use of hands   Standing Unsupported with Eyes Closed Able to stand 10 seconds with supervision   Standing Ubsupported with Feet Together Able to place feet together independently and stand for 1 minute with supervision   From Standing, Reach Forward with Outstretched Arm Can reach forward >12 cm safely (5")   From Standing Position, Pick up Object from [place] to pick up shoe safely and easily   From Standing Position, Turn to Look Behind Over each Shoulder Looks behind from both sides and weight shifts well   Turn 360 Degrees Able to turn 360 degrees safely one side only in 4 seconds or less   Standing  Unsupported, Alternately Place Feet on Step/Stool Able to complete >2 steps/needs minimal assist   Standing Unsupported, One Foot in Front Able to plae foot ahead of the other independently and hold 30 seconds   Standing on One Leg Tries to lift leg/unable to hold 3 seconds but remains standing independently   Total Score 43   Berg comment: 43/56 indicating significant risk for falls     Timed Up and Go Test   TUG Normal TUG   Normal TUG (seconds) 16.94             [facility] Adult PT Treatment/Exercise - 07/22/16 1624      Ambulation/Gait   Ambulation/Gait Yes   Ambulation/Gait Assistance 4: Min guard   Ambulation/Gait Assistance Details Required min guard due to occaisional instablity vearing to right and pt reported feeling very unsteady on legs today   Ambulation Distance (Feet) 500 Feet   Assistive device Rollator   Gait Pattern Step-through pattern;Decreased trunk rotation;Trunk flexed   Ambulation Surface Level;Unlevel;Outdoor;Indoor;Paved   Gait velocity 1.73 ft/sec with rollator, 2.26 ft/sec without AD   Gait Comments ambulated outdoors with rollator, 70 feet with no AD, min guard             PT Short Term Goals - 05/31/16 1024      PT SHORT TERM GOAL #1   Title Will improve five time sit to stand time to 12 seconds without use of UE for improved functional LE strength (TARGET DATE: all STGs now due 06/04/2016 due to missed therapy due to cellulitis)    Baseline 14 seconds without UE; 9.47 seconds on 05/31/2016   Status Achieved     PT SHORT TERM GOAL #2   Title Will decrease falls risk as indicated by decrease in TUG score <15 seconds without RW    Baseline 18.63 without RW; 12.94 seconds without RW on 05/31/2016   Status Achieved     PT SHORT TERM GOAL #3   Title Will ambulate up/down 8 stairs with one rail with alternating method and Mod I   Baseline supervision with 2 rails; supervision with one rail and alternating sequence to ascend, bilat UE support and step  to sequence to descend due to RLE buckling today, partially met   Status Partially Met     PT SHORT TERM GOAL #4   Title Pt will perform gait on level, indoor surfaces without RW x 150' with supervision   Baseline Min A without RW; supervision x 230' on indoor, level  surface without RW   Status Achieved     PT SHORT TERM GOAL #5   Title Will perform BERG to assess balance deficits and LTG to be set   Baseline Goal met 04/28/16; LTG set   Status Achieved           PT Long Term Goals - 07/22/16 1704      PT LONG TERM GOAL #1   Title Will improve hip and knee flexion strength to 5/5 bilaterally and will perform 5 times sit to stand in <12 seconds without UE support (TARGET DATE FOR ALL LTG 06/20/16)   Baseline met on 05/31/16   Time 8   Period Weeks   Status Achieved     PT LONG TERM GOAL #2   Title (NEW TARGET DATE FOR REMAINING LTG IS 07/17/2016) Pt will improve safety with gait as indicated by increase in 10 meter walk test/gait velocity to >2.62 ft/sec without AD   Baseline 07/22/16: 2.26 ft/sec without AD; progress but not enough to meet goal yet   Time 4   Period Weeks   Status On-going     PT LONG TERM GOAL #3   Title Will decrease falls risk as indicated by a TUG score of <13 seconds without RW (07/17/16)   Baseline 07/22/16: 16.94 seconds without AD; improved but not enough to meet goal yet   Time 4   Period Weeks   Status On-going     PT LONG TERM GOAL #4   Title Will perform HEP independently (07/17/16)   Time 4   Period Weeks   Status Achieved     PT LONG TERM GOAL #5   Title Will ambulate over uneven outdoor surfaces x 500 ft. and up/down 8 stairs with SPC and R rail MOD I (07/17/16)   Baseline 1,000 outdoors on uneven pavement with SPC with min guard, 8 stairs with SPC and rail with supervision   Time 4   Period Weeks   Status On-going     PT LONG TERM GOAL #6   Title Pt will decrease falls risk and will improve BERG balance score to >50/56 overall (07/17/16)    Baseline On-going :07/22/16=43/56 indicating significant risk for falls   Time 4   Period Weeks   Status On-going               Plan - 07/22/16 1723    Clinical Impression Statement Today's skilled session focused on checking LTG's and assessing current function after 2  week vacation. She reports she was doing very well ambulating over uneven surfaces, going up and down many stairs with ease over the past 2  weeks, but on Tuesday noticed a decline in function c/b feeling unsteady having "rubber legs." Patient feels like her decreased balance and unsteadiness is due to her prednisone taper. She has had a similar reaction in the past. Patient performance on the TUG and 10 meter walk test are both improved but not at goal level yet.  She met her goal for independence with her HEP and is committed to exercising as much as possible throughout the day. Patient is progressing well towards all LTG's demonstrated by improved gait speed and TUG score without an AD. Her score on the BERG Balance test was slightly decreased from baseline which likely reflects her decline in function since Tuesday. Patient will benefit from continued PT to improve balance and independence with functional mobility.   Rehab Potential Good   Clinical Impairments Affecting Rehab Potential 2  year history of LE weakness and steroid use   PT Frequency 2x / week   PT Duration 8 weeks   PT Treatment/Interventions ADLs/Self Care Home Management;Gait training;Stair training;Functional mobility training;DME Instruction;Therapeutic activities;Therapeutic exercise;Balance training;Neuromuscular re-education;Patient/family education;Passive range of motion;Taping   PT Next Visit Plan Continue focus on LE strength and balance   Consulted and Agree with Plan of Care Patient      Patient will benefit from skilled therapeutic intervention in order to improve the following deficits and impairments:  Abnormal gait, Decreased balance,  Decreased strength, Difficulty walking, Decreased endurance  Visit Diagnosis: Muscle weakness (generalized)  Difficulty in walking, not elsewhere classified  Other abnormalities of gait and mobility  Unsteadiness on feet     Problem List Patient Active Problem List   Diagnosis Date Noted  . SIRS (systemic inflammatory response syndrome) (HCC)   . Weakness of both lower extremities   . Arthritis   . Sepsis secondary to UTI ([place]) 03/18/2015  . Gout attack 10/23/2014  . Toe effusion 05/24/2014  . Anemia in neoplastic disease 04/02/2014  . CKD (chronic kidney disease), stage III 04/02/2014  . Wound infection 03/18/2014  . Cellulitis 03/18/2014  . Acute renal failure ([place]) 03/18/2014  . Leg edema, right   . Open wound of leg 02/22/2014  . Breast cancer, right breast ([place]) 05/13/2011  . Incisional hernia, incarcerated, right upper quadrant 07/13/2010  . HEADACHE 11/06/2009  . CONTACT DERMATITIS 06/13/2009  . FATIGUE 06/13/2009  . EDEMA 05/01/2008  . BACK PAIN 03/29/2008  . CAROTID STENOSIS 03/16/2008  . ANXIETY 11/14/2007  . MENOPAUSAL SYNDROME 09/27/2007  . GLUCOSE INTOLERANCE 08/10/2007  . HYPERLIPIDEMIA 08/10/2007  . PERIPHERAL VASCULAR DISEASE 08/10/2007  . Venous (peripheral) insufficiency 08/10/2007  . Vitamin D deficiency 12/26/2006  . OBESITY 12/26/2006  . DUPUYTREN'S CONTRACTURE 12/26/2006  . Hypothyroidism 11/05/2006  . OSTEOARTHRITIS 11/05/2006    Vaughan Sine 07/22/2016, 5:24 PM  [facility] [address] [place], [place], [address] Phone: [phone]   Fax:  [phone]  Name: CROSBY BEVAN MRN: [id number] Date of Birth: 12/19/1933  This note has been reviewed and edited by supervising CI.  Willow Ora, PTA, [facility] [address] [place], [place] [address] [phone] 07/23/16, 9:15 AM

## 2016-07-23 ENCOUNTER — Encounter: Payer: Self-pay | Admitting: Physical Therapy

## 2016-07-23 ENCOUNTER — Ambulatory Visit: Payer: PPO | Admitting: Physical Therapy

## 2016-07-23 DIAGNOSIS — M6281 Muscle weakness (generalized): Secondary | ICD-10-CM

## 2016-07-23 DIAGNOSIS — R2689 Other abnormalities of gait and mobility: Secondary | ICD-10-CM

## 2016-07-23 DIAGNOSIS — R262 Difficulty in walking, not elsewhere classified: Secondary | ICD-10-CM

## 2016-07-23 DIAGNOSIS — R2681 Unsteadiness on feet: Secondary | ICD-10-CM

## 2016-07-23 NOTE — Therapy (Signed)
[facility] [address] [place], [place], [address] Phone: [phone]   Fax:  [phone]  Physical Therapy Treatment  Patient Details  Name: Rachael [name] MRN: [id number] Date of Birth: Oct 28, 1933 Referring Provider: Lahoma Rocker, MD  Encounter Date: 07/23/2016      PT End of Session - 07/23/16 1512    Visit Number 16   Number of Visits 30   Date for PT Re-Evaluation 08/16/16   Authorization Type [employer] G Code every 10th visit   PT Start Time 1230   PT Stop Time 1314   PT Time Calculation (min) 44 min   Equipment Utilized During Treatment Gait belt   Activity Tolerance Patient tolerated treatment well   Behavior During Therapy [facility] for tasks assessed/performed      Past Medical History:  Diagnosis Date  . Breast cancer (HCC)    R  . CAROTID STENOSIS   . Chronic venous insufficiency   . CONTACT DERMATITIS   . DUPUYTREN'S CONTRACTURE   . Edema    pt  states had edema of feet and legs has been on lasix per dr. to help  . GLUCOSE INTOLERANCE   . HLD (hyperlipidemia)   . Hypothyroidism    Dr. Ala Dach in [place]   . MENOPAUSAL SYNDROME   . Obesity   . Osteoarthritis    bilat hips, severe  . PVD (peripheral vascular disease) ([place])    mild carotid 11/05  . VITAMIN D DEFICIENCY     Past Surgical History:  Procedure Laterality Date  . 2D echo  11/29/03   normal LV size. normal EF   . [place]   lumbarectomy  . BREAST BIOPSY  05/11/11   right breast  . BREAST LUMPECTOMY     right breast  . CHOLECYSTECTOMY    . FINGER SURGERY    . HERNIA REPAIR     incisional hernia  . INGUINAL HERNIA REPAIR    . JOINT REPLACEMENT Bilateral    Dr. Maureen Ralphs  . normal caronary arteries     per pt. 2003  . right rotator cuff surgery    . TONSILLECTOMY    . TOTAL HIP ARTHROPLASTY  05/2008 and 10/2008   B hip - alusio    There were no vitals filed for this visit.      Subjective  Assessment - 07/23/16 1509    Subjective Patient reports she felt good after tx yesterday and continued to exercise after she got home with no adverse effects. She said she feels a bit more steady today and is considering attending a [employer] conference in [place] this weekend, but is going to see how treatment goes today to see if she thinks she has the endurance to attend. No falls reported.          [facility] Adult PT Treatment/Exercise - 07/23/16 1316      Ambulation/Gait   Ambulation/Gait Yes   Ambulation/Gait Assistance 5: Supervision   Ambulation/Gait Assistance Details Pt tolerated ambulating 1000 feet outdoors navigating up and down inclines, traveling over grassy surfaces with no LOB.    Ambulation Distance (Feet) 1000 Feet   Assistive device Rollator   Gait Pattern Step-through pattern;Decreased trunk rotation;Trunk flexed   Ambulation Surface Level;Unlevel;Indoor;Outdoor;Paved;Grass     Neuro Re-ed    Neuro Re-ed Details  Performed static and dynamic balance exercises on compliant surfaces     Compliant surfaces: AIREX placed in corner with chair positioned in front for safety:  --  static EO 20 seconds x3, intermittent LOB, responded well to VC for weight shifting, min assist, --progressed to EO with head movements right<>left, up<>down, both diagonals x10 each, VC for weight shifting, min assist. --Attempted marching however pt c/o increased pain traveling down lateral side of her R calf.  --After rest break attempted heel raises on AIREX with B UE support on chair; performed x4 when pain returned to her R calf.  Red mats placed in front of the counter: lateral walks x3 laps with no UE support, VC for posture, min guard.  Small rockerboard: EO static balance 20 seconds x2, no UE support, min guard; rock board forward and back x10 with VC for posture and weight shifting, min assist; --EC static balance 20 seconds x3, no UE support, min assist, VC for weight shifting         PT Short Term Goals - 05/31/16 1024      PT SHORT TERM GOAL #1   Title Will improve five time sit to stand time to 12 seconds without use of UE for improved functional LE strength (TARGET DATE: all STGs now due 06/04/2016 due to missed therapy due to cellulitis)    Baseline 14 seconds without UE; 9.47 seconds on 05/31/2016   Status Achieved     PT SHORT TERM GOAL #2   Title Will decrease falls risk as indicated by decrease in TUG score <15 seconds without RW    Baseline 18.63 without RW; 12.94 seconds without RW on 05/31/2016   Status Achieved     PT SHORT TERM GOAL #3   Title Will ambulate up/down 8 stairs with one rail with alternating method and Mod I   Baseline supervision with 2 rails; supervision with one rail and alternating sequence to ascend, bilat UE support and step to sequence to descend due to RLE buckling today, partially met   Status Partially Met     PT SHORT TERM GOAL #4   Title Pt will perform gait on level, indoor surfaces without RW x 150' with supervision   Baseline Min A without RW; supervision x 230' on indoor, level surface without RW   Status Achieved     PT SHORT TERM GOAL #5   Title Will perform BERG to assess balance deficits and LTG to be set   Baseline Goal met 04/28/16; LTG set   Status Achieved           PT Long Term Goals - 07/22/16 1704      PT LONG TERM GOAL #1   Title Will improve hip and knee flexion strength to 5/5 bilaterally and will perform 5 times sit to stand in <12 seconds without UE support (TARGET DATE FOR ALL LTG 06/20/16)   Baseline met on 05/31/16   Time 8   Period Weeks   Status Achieved     PT LONG TERM GOAL #2   Title (NEW TARGET DATE FOR REMAINING LTG IS 07/17/2016) Pt will improve safety with gait as indicated by increase in 10 meter walk test/gait velocity to >2.62 ft/sec without AD   Baseline 07/22/16: 2.26 ft/sec without AD; progress but not enough to meet goal yet   Time 4   Period Weeks   Status On-going     PT  LONG TERM GOAL #3   Title Will decrease falls risk as indicated by a TUG score of <13 seconds without RW (07/17/16)   Baseline 07/22/16: 16.94 seconds without AD; improved but not enough to meet goal yet  Time 4   Period Weeks   Status On-going     PT LONG TERM GOAL #4   Title Will perform HEP independently (07/17/16)   Time 4   Period Weeks   Status Achieved     PT LONG TERM GOAL #5   Title Will ambulate over uneven outdoor surfaces x 500 ft. and up/down 8 stairs with SPC and R rail MOD I (07/17/16)   Baseline 1,000 outdoors on uneven pavement with SPC with min guard, 8 stairs with SPC and rail with supervision   Time 4   Period Weeks   Status On-going     PT LONG TERM GOAL #6   Title Pt will decrease falls risk and will improve BERG balance score to >50/56 overall (07/17/16)   Baseline On-going :07/22/16=43/56 indicating significant risk for falls   Time 4   Period Weeks   Status On-going               Plan - 07/23/16 1513    Clinical Impression Statement Today's session focused on static and dynamic balance in standing as well as in walking over uneven outdoor surfaces, including inclines and grassy surfaces. Patient demonstrated improved balance and endurance ambulating outdoors over uneven surfaces traveling 1000 feet today with no LOB requiring decreased level of assist.  She performed static and dynamic balance exercises with accuracy given min assist; however, endurance was limited by fatigue and complaints of pain that radiates down lateral aspect of her calf with prolonged weight bearing. SLS activities easily triggered pain in R lower leg. Patient will benefit from continued PT to progress towards unmet goals to improve LE strength and balance.   Rehab Potential Good   Clinical Impairments Affecting Rehab Potential 2 year history of LE weakness and steroid use   PT Frequency 2x / week   PT Duration 8 weeks   PT Treatment/Interventions ADLs/Self Care Home Management;Gait  training;Stair training;Functional mobility training;DME Instruction;Therapeutic activities;Therapeutic exercise;Balance training;Neuromuscular re-education;Patient/family education;Passive range of motion;Taping   PT Next Visit Plan Challenge dynamic balance in gait, with head turns, navigating around objects, continue to address LE stregnthening and balance.=   Consulted and Agree with Plan of Care Patient      Patient will benefit from skilled therapeutic intervention in order to improve the following deficits and impairments:  Abnormal gait, Decreased balance, Decreased strength, Difficulty walking, Decreased endurance  Visit Diagnosis: Muscle weakness (generalized)  Difficulty in walking, not elsewhere classified  Other abnormalities of gait and mobility  Unsteadiness on feet     Problem List Patient Active Problem List   Diagnosis Date Noted  . SIRS (systemic inflammatory response syndrome) (HCC)   . Weakness of both lower extremities   . Arthritis   . Sepsis secondary to UTI ([place]) 03/18/2015  . Gout attack 10/23/2014  . Toe effusion 05/24/2014  . Anemia in neoplastic disease 04/02/2014  . CKD (chronic kidney disease), stage III 04/02/2014  . Wound infection 03/18/2014  . Cellulitis 03/18/2014  . Acute renal failure ([place]) 03/18/2014  . Leg edema, right   . Open wound of leg 02/22/2014  . Breast cancer, right breast ([place]) 05/13/2011  . Incisional hernia, incarcerated, right upper quadrant 07/13/2010  . HEADACHE 11/06/2009  . CONTACT DERMATITIS 06/13/2009  . FATIGUE 06/13/2009  . EDEMA 05/01/2008  . BACK PAIN 03/29/2008  . CAROTID STENOSIS 03/16/2008  . ANXIETY 11/14/2007  . MENOPAUSAL SYNDROME 09/27/2007  . GLUCOSE INTOLERANCE 08/10/2007  . HYPERLIPIDEMIA 08/10/2007  . PERIPHERAL VASCULAR DISEASE 08/10/2007  .  Venous (peripheral) insufficiency 08/10/2007  . Vitamin D deficiency 12/26/2006  . OBESITY 12/26/2006  . DUPUYTREN'S CONTRACTURE 12/26/2006  .  Hypothyroidism 11/05/2006  . OSTEOARTHRITIS 11/05/2006    Rulon Eisenmenger, SPTA 07/23/2016, 3:18 PM  [facility] [address] [place], [place], [address] Phone: [phone]   Fax:  [phone]  Name: JOSEPHINA MELCHER MRN: [id number] Date of Birth: June 14, 1933

## 2016-07-26 ENCOUNTER — Encounter: Payer: Self-pay | Admitting: Rehabilitation

## 2016-07-26 ENCOUNTER — Ambulatory Visit: Payer: PPO | Admitting: Rehabilitation

## 2016-07-26 DIAGNOSIS — R262 Difficulty in walking, not elsewhere classified: Secondary | ICD-10-CM

## 2016-07-26 DIAGNOSIS — R2681 Unsteadiness on feet: Secondary | ICD-10-CM

## 2016-07-26 DIAGNOSIS — M6281 Muscle weakness (generalized): Secondary | ICD-10-CM

## 2016-07-26 DIAGNOSIS — R2689 Other abnormalities of gait and mobility: Secondary | ICD-10-CM

## 2016-07-26 NOTE — Therapy (Signed)
[facility] [address] [place], [place], [address] Phone: [phone]   Fax:  [phone]  Physical Therapy Treatment  Patient Details  Name: Rachael [name] MRN: [id number] Date of Birth: [birth year] Referring Provider: Lahoma Rocker, MD  Encounter Date: 07/26/2016      PT End of Session - 07/26/16 1704    Visit Number 17   Number of Visits 30   Date for PT Re-Evaluation 08/16/16   Authorization Type [employer] G Code every 10th visit   PT Start Time 1316   PT Stop Time 1400   PT Time Calculation (min) 44 min   Equipment Utilized During Treatment Gait belt   Activity Tolerance Patient tolerated treatment well   Behavior During Therapy [facility] for tasks assessed/performed      Past Medical History:  Diagnosis Date  . Breast cancer (HCC)    R  . CAROTID STENOSIS   . Chronic venous insufficiency   . CONTACT DERMATITIS   . DUPUYTREN'S CONTRACTURE   . Edema    pt  states had edema of feet and legs has been on lasix per dr. to help  . GLUCOSE INTOLERANCE   . HLD (hyperlipidemia)   . Hypothyroidism    Dr. Ala Dach in [place]   . MENOPAUSAL SYNDROME   . Obesity   . Osteoarthritis    bilat hips, severe  . PVD (peripheral vascular disease) ([place])    mild carotid 11/05  . VITAMIN D DEFICIENCY     Past Surgical History:  Procedure Laterality Date  . 2D echo  11/29/03   normal LV size. normal EF   . [place]   lumbarectomy  . BREAST BIOPSY  05/11/11   right breast  . BREAST LUMPECTOMY     right breast  . CHOLECYSTECTOMY    . FINGER SURGERY    . HERNIA REPAIR     incisional hernia  . INGUINAL HERNIA REPAIR    . JOINT REPLACEMENT Bilateral    Dr. Maureen Ralphs  . normal caronary arteries     per pt. 2003  . right rotator cuff surgery    . TONSILLECTOMY    . TOTAL HIP ARTHROPLASTY  05/2008 and 10/2008   B hip - alusio    There were no vitals filed for this visit.      Subjective  Assessment - 07/26/16 1321    Subjective Pt reports not having a good day as they are tapering Prenisone. Feels that legs are weaker today.     Pertinent History 2013 breast CA with lumpectomy, h/o bilat THA, hypothyroid, PVD   Limitations Walking;House hold activities   Patient Stated Goals Walk normally again and not use AD   Currently in Pain? Yes  more achy   Pain Score 6    Pain Location Hip   Pain Orientation Right   Pain Descriptors / Indicators Throbbing   Pain Type Acute pain   Pain Onset In the past 7 days   Pain Frequency Intermittent   Aggravating Factors  being on her feet for longer periods of time   Pain Relieving Factors being off of her feet, has lift chair that she positions in a certain way.                          [facility] Adult PT Treatment/Exercise - 07/26/16 0001      Exercises   Exercises Other Exercises   Other Exercises  Pt with increased pain in R hip today but would like to work on BLE strength.  Had pt warm up on nustep x 3 mins with BLEs only at level 3 resistance.  tolerated well, but needed help elevating RLE to/from pedal.  Also note that she had increased pain following getting up from nustep.  Progressed to side stepping with mini squats in // bars x 2 reps down and back.  Pt tolerated this very well and states this feels better on R hip.  Then had pt work on lateral and frontal step ups/down and note increased pain with lateral, but more pain with forward stepping.   Then had pt lie down on mat to further assess hip and what seemed like piriformis tightness.  Note pt VERY point tender to palpation at R piriformis as well as R IT band.  Had pt perform L SL IT band stretch (with RLE dropping behind her).  Had her utilize leg loop to assist and educated on where to get these.  Pt able to return demo.  Note that she states she is doing stretch in standing.  Note that she states she gets same amount of stretch with either, therefore educated she  could do either.  Also recommended tennis ball piriformis release.  She is able to tolerate sitting, however recommended that she do in standing to reduce pain if unable to tolerate in sitting.  Pt verbalized understanding.       Manual Therapy   Manual Therapy Soft tissue mobilization   Soft tissue mobilization foam roll soft tissue mobilization to R IT band x 3-4 mins.  Pt did allow more pressure as we went, but still tender.  Note she reports improved flexibllity and walking following stretches.                  PT Education - 07/26/16 1704    Education provided Yes   Education Details adding [place] to POC for next session .   Person(s) Educated Patient   Methods Explanation   Comprehension Verbalized understanding          PT Short Term Goals - 05/31/16 1024      PT SHORT TERM GOAL #1   Title Will improve five time sit to stand time to 12 seconds without use of UE for improved functional LE strength (TARGET DATE: all STGs now due 06/04/2016 due to missed therapy due to cellulitis)    Baseline 14 seconds without UE; 9.47 seconds on 05/31/2016   Status Achieved     PT SHORT TERM GOAL #2   Title Will decrease falls risk as indicated by decrease in TUG score <15 seconds without RW    Baseline 18.63 without RW; 12.94 seconds without RW on 05/31/2016   Status Achieved     PT SHORT TERM GOAL #3   Title Will ambulate up/down 8 stairs with one rail with alternating method and Mod I   Baseline supervision with 2 rails; supervision with one rail and alternating sequence to ascend, bilat UE support and step to sequence to descend due to RLE buckling today, partially met   Status Partially Met     PT SHORT TERM GOAL #4   Title Pt will perform gait on level, indoor surfaces without RW x 150' with supervision   Baseline Min A without RW; supervision x 230' on indoor, level surface without RW   Status Achieved     PT SHORT TERM GOAL #5   Title Will perform  BERG to assess balance  deficits and LTG to be set   Baseline Goal met 04/28/16; LTG set   Status Achieved           PT Long Term Goals - 07/26/16 1922      PT LONG TERM GOAL #1   Title Will improve hip and knee flexion strength to 5/5 bilaterally and will perform 5 times sit to stand in <12 seconds without UE support (TARGET DATE FOR ALL LTG 06/20/16)   Baseline met on 05/31/16   Time 8   Period Weeks   Status Achieved     PT LONG TERM GOAL #2   Title (NEW TARGET DATE FOR REMAINING LTG IS 08/16/2016) Pt will improve safety with gait as indicated by increase in 10 meter walk test/gait velocity to >2.62 ft/sec without AD   Baseline 07/22/16: 2.26 ft/sec without AD; progress but not enough to meet goal yet   Time 4   Period Weeks   Status On-going     PT LONG TERM GOAL #3   Title Will decrease falls risk as indicated by a TUG score of <13 seconds without RW (08/16/16)   Baseline 07/22/16: 16.94 seconds without AD; improved but not enough to meet goal yet   Time 4   Period Weeks   Status On-going     PT LONG TERM GOAL #4   Title Will perform HEP independently (07/17/16)   Time 4   Period Weeks   Status Achieved     PT LONG TERM GOAL #5   Title Will ambulate over uneven outdoor surfaces x 500 ft. and up/down 8 stairs with SPC and R rail MOD I (08/16/16)   Baseline 1,000 outdoors on uneven pavement with SPC with min guard, 8 stairs with SPC and rail with supervision   Time 4   Period Weeks   Status On-going     PT LONG TERM GOAL #6   Title Pt will decrease falls risk and will improve BERG balance score to >50/56 overall (08/16/16)   Baseline On-going :07/22/16=43/56 indicating significant risk for falls   Time 4   Period Weeks   Status On-going               Plan - 07/26/16 1704    Clinical Impression Statement Skilled session focused on BLE strengthening however note that pt had increased R hip pain (has noticed increased pain and weakness since beginning to taper Prednisone), therefore spent  remainder of session working on manual therapy and stretching to reduce pain and improve mobility.  Pt reports feeling and walking better following stretching during session.    Rehab Potential Good   Clinical Impairments Affecting Rehab Potential 2 year history of LE weakness and steroid use   PT Frequency 2x / week   PT Duration 4 weeks   PT Treatment/Interventions ADLs/Self Care Home Management;Gait training;Stair training;Functional mobility training;DME Instruction;Therapeutic activities;Therapeutic exercise;Balance training;Neuromuscular re-education;Patient/family education;Passive range of motion;Taping;Ultrasound;Electrical Stimulation;Cryotherapy;Moist Heat   PT Next Visit Plan Continue to address IT band and piriformis tightness-can now do ultrasound.  Challenge dynamic balance in gait, with head turns, navigating around objects, continue to address LE stregnthening and balance.=   Consulted and Agree with Plan of Care Patient      Patient will benefit from skilled therapeutic intervention in order to improve the following deficits and impairments:  Abnormal gait, Decreased balance, Decreased strength, Difficulty walking, Decreased endurance  Visit Diagnosis: Muscle weakness (generalized)  Difficulty in walking, not elsewhere classified  Other  abnormalities of gait and mobility  Unsteadiness on feet     Problem List Patient Active Problem List   Diagnosis Date Noted  . SIRS (systemic inflammatory response syndrome) (HCC)   . Weakness of both lower extremities   . Arthritis   . Sepsis secondary to UTI ([name]) 03/18/2015  . Gout attack 10/23/2014  . Toe effusion 05/24/2014  . Anemia in neoplastic disease 04/02/2014  . CKD (chronic kidney disease), stage III 04/02/2014  . Wound infection 03/18/2014  . Cellulitis 03/18/2014  . Acute renal failure ([place]) 03/18/2014  . Leg edema, right   . Open wound of leg 02/22/2014  . Breast cancer, right breast ([place]) 05/13/2011  .  Incisional hernia, incarcerated, right upper quadrant 07/13/2010  . HEADACHE 11/06/2009  . CONTACT DERMATITIS 06/13/2009  . FATIGUE 06/13/2009  . EDEMA 05/01/2008  . BACK PAIN 03/29/2008  . CAROTID STENOSIS 03/16/2008  . ANXIETY 11/14/2007  . MENOPAUSAL SYNDROME 09/27/2007  . GLUCOSE INTOLERANCE 08/10/2007  . HYPERLIPIDEMIA 08/10/2007  . PERIPHERAL VASCULAR DISEASE 08/10/2007  . Venous (peripheral) insufficiency 08/10/2007  . Vitamin D deficiency 12/26/2006  . OBESITY 12/26/2006  . DUPUYTREN'S CONTRACTURE 12/26/2006  . Hypothyroidism 11/05/2006  . OSTEOARTHRITIS 11/05/2006    Cameron Sprang, PT, MPT [facility] [address] [place], [place], [address] Phone: [phone]   Fax:  [phone] 07/26/16, 7:28 PM  Name: Rachael [name] MRN: [id number] Date of Birth: 1933-08-08

## 2016-07-27 DIAGNOSIS — E559 Vitamin D deficiency, unspecified: Secondary | ICD-10-CM | POA: Diagnosis not present

## 2016-07-27 DIAGNOSIS — R5382 Chronic fatigue, unspecified: Secondary | ICD-10-CM | POA: Diagnosis not present

## 2016-07-27 DIAGNOSIS — E538 Deficiency of other specified B group vitamins: Secondary | ICD-10-CM | POA: Diagnosis not present

## 2016-07-27 DIAGNOSIS — R947 Abnormal results of other endocrine function studies: Secondary | ICD-10-CM | POA: Diagnosis not present

## 2016-07-27 DIAGNOSIS — R5383 Other fatigue: Secondary | ICD-10-CM | POA: Diagnosis not present

## 2016-07-27 DIAGNOSIS — E018 Other iodine-deficiency related thyroid disorders and allied conditions: Secondary | ICD-10-CM | POA: Diagnosis not present

## 2016-07-29 ENCOUNTER — Encounter: Payer: Self-pay | Admitting: Physical Therapy

## 2016-07-29 ENCOUNTER — Ambulatory Visit: Payer: PPO | Admitting: Physical Therapy

## 2016-07-29 DIAGNOSIS — R2689 Other abnormalities of gait and mobility: Secondary | ICD-10-CM

## 2016-07-29 DIAGNOSIS — M6281 Muscle weakness (generalized): Secondary | ICD-10-CM

## 2016-07-29 DIAGNOSIS — R262 Difficulty in walking, not elsewhere classified: Secondary | ICD-10-CM

## 2016-07-29 DIAGNOSIS — R2681 Unsteadiness on feet: Secondary | ICD-10-CM

## 2016-07-29 NOTE — Therapy (Signed)
[facility] [address] [place], [place], [address] Phone: [phone]   Fax:  [phone]  Physical Therapy Treatment  Patient Details  Name: Rachael [name] MRN: [id number] Date of Birth: Feb 13, 1933 Referring Provider: Lahoma Rocker, MD  Encounter Date: 07/29/2016      PT End of Session - 07/29/16 1603    Visit Number 18   Number of Visits 30   Date for PT Re-Evaluation 08/16/16   Authorization Type [employer] G Code every 10th visit   PT Start Time 1316   PT Stop Time 1400   PT Time Calculation (min) 44 min   Equipment Utilized During Treatment Gait belt   Activity Tolerance Patient tolerated treatment well   Behavior During Therapy [facility] for tasks assessed/performed      Past Medical History:  Diagnosis Date  . Breast cancer (HCC)    R  . CAROTID STENOSIS   . Chronic venous insufficiency   . CONTACT DERMATITIS   . DUPUYTREN'S CONTRACTURE   . Edema    pt  states had edema of feet and legs has been on lasix per dr. to help  . GLUCOSE INTOLERANCE   . HLD (hyperlipidemia)   . Hypothyroidism    Dr. Ala Dach in [place]   . MENOPAUSAL SYNDROME   . Obesity   . Osteoarthritis    bilat hips, severe  . PVD (peripheral vascular disease) ([place])    mild carotid 11/05  . VITAMIN D DEFICIENCY     Past Surgical History:  Procedure Laterality Date  . 2D echo  11/29/03   normal LV size. normal EF   . [place]   lumbarectomy  . BREAST BIOPSY  05/11/11   right breast  . BREAST LUMPECTOMY     right breast  . CHOLECYSTECTOMY    . FINGER SURGERY    . HERNIA REPAIR     incisional hernia  . INGUINAL HERNIA REPAIR    . JOINT REPLACEMENT Bilateral    Dr. Maureen Ralphs  . normal caronary arteries     per pt. 2003  . right rotator cuff surgery    . TONSILLECTOMY    . TOTAL HIP ARTHROPLASTY  05/2008 and 10/2008   B hip - alusio    There were no vitals filed for this visit.      Subjective  Assessment - 07/29/16 1533    Subjective Pt reports she was feeling a little better and then she cleaned a very large carpet in her house.  She said it took her 8 hours.  She said she'd clean one section, then take a break, and so on unitl the whole carpet was clean. Today she says the pain is pretty bad.   Pertinent History 2013 breast CA with lumpectomy, h/o bilat THA, hypothyroid, PVD   Limitations Walking;House hold activities   Patient Stated Goals Walk normally again and not use AD   Currently in Pain? Yes   Pain Score 5    Pain Location Hip   Pain Orientation Right   Pain Descriptors / Indicators Throbbing;Discomfort   Pain Type Acute pain   Pain Onset In the past 7 days   Pain Frequency Intermittent   Aggravating Factors  weight bearing   Pain Relieving Factors rest            [facility] Adult PT Treatment/Exercise - 07/29/16 1547      Modalities   Modalities Ultrasound     Ultrasound   Ultrasound Location  Inferior, posterior lateral aspect R glute maximus, and approximately 1/2 way down lateral aspect R thigh; concentrated application to tender points reported by pt.   Ultrasound Parameters 1 MHz, 1.5 W/cm, 10 minutes   Ultrasound Goals Pain     Manual Therapy   Manual Therapy Soft tissue mobilization   Manual therapy comments Soft tissue mobilization to R posterior lateral thigh beginning at inferior aspect R glute maximus extending down to lateral and posterior aspect R thigh. Noted 2 distinct trigger points, tender to touch, which diminished with soft tissue mobilization. Pt. reported decreased pain and improved function upon standing.   Soft tissue mobilization foam roll for 3 minutes applied to same area listed above              PT Short Term Goals - 05/31/16 1024      PT SHORT TERM GOAL #1   Title Will improve five time sit to stand time to 12 seconds without use of UE for improved functional LE strength (TARGET DATE: all STGs now due 06/04/2016 due to missed  therapy due to cellulitis)    Baseline 14 seconds without UE; 9.47 seconds on 05/31/2016   Status Achieved     PT SHORT TERM GOAL #2   Title Will decrease falls risk as indicated by decrease in TUG score <15 seconds without RW    Baseline 18.63 without RW; 12.94 seconds without RW on 05/31/2016   Status Achieved     PT SHORT TERM GOAL #3   Title Will ambulate up/down 8 stairs with one rail with alternating method and Mod I   Baseline supervision with 2 rails; supervision with one rail and alternating sequence to ascend, bilat UE support and step to sequence to descend due to RLE buckling today, partially met   Status Partially Met     PT SHORT TERM GOAL #4   Title Pt will perform gait on level, indoor surfaces without RW x 150' with supervision   Baseline Min A without RW; supervision x 230' on indoor, level surface without RW   Status Achieved     PT SHORT TERM GOAL #5   Title Will perform BERG to assess balance deficits and LTG to be set   Baseline Goal met 04/28/16; LTG set   Status Achieved           PT Long Term Goals - 07/26/16 1922      PT LONG TERM GOAL #1   Title Will improve hip and knee flexion strength to 5/5 bilaterally and will perform 5 times sit to stand in <12 seconds without UE support (TARGET DATE FOR ALL LTG 06/20/16)   Baseline met on 05/31/16   Time 8   Period Weeks   Status Achieved     PT LONG TERM GOAL #2   Title (NEW TARGET DATE FOR REMAINING LTG IS 08/16/2016) Pt will improve safety with gait as indicated by increase in 10 meter walk test/gait velocity to >2.62 ft/sec without AD   Baseline 07/22/16: 2.26 ft/sec without AD; progress but not enough to meet goal yet   Time 4   Period Weeks   Status On-going     PT LONG TERM GOAL #3   Title Will decrease falls risk as indicated by a TUG score of <13 seconds without RW (08/16/16)   Baseline 07/22/16: 16.94 seconds without AD; improved but not enough to meet goal yet   Time 4   Period Weeks   Status  On-going  PT LONG TERM GOAL #4   Title Will perform HEP independently (07/17/16)   Time 4   Period Weeks   Status Achieved     PT LONG TERM GOAL #5   Title Will ambulate over uneven outdoor surfaces x 500 ft. and up/down 8 stairs with SPC and R rail MOD I (08/16/16)   Baseline 1,000 outdoors on uneven pavement with SPC with min guard, 8 stairs with SPC and rail with supervision   Time 4   Period Weeks   Status On-going     PT LONG TERM GOAL #6   Title Pt will decrease falls risk and will improve BERG balance score to >50/56 overall (08/16/16)   Baseline On-going :07/22/16=43/56 indicating significant risk for falls   Time 4   Period Weeks   Status On-going               Plan - 07/29/16 1617    Clinical Impression Statement Pt. reports to PT today with continued pain in her R hip. Her hip was feeling a bit better after last session, when she cleaned a large carpet in her house and woke up with pain in her R hip again. Today's session focused on reducing pain, increasing tissue extensibility, and release of palpable tender points at inferior aspect R glute max and approximately half way down R posterior lateral thigh. Pt. reported decreased pain and tenderness in area treated and improved function upon standing. She will benefit from continued skilled PT to progress towards unmet goals.   Rehab Potential Good   Clinical Impairments Affecting Rehab Potential 2 year history of LE weakness and steroid use   PT Frequency 2x / week   PT Duration 4 weeks   PT Treatment/Interventions ADLs/Self Care Home Management;Gait training;Stair training;Functional mobility training;DME Instruction;Therapeutic activities;Therapeutic exercise;Balance training;Neuromuscular re-education;Patient/family education;Passive range of motion;Taping;Ultrasound;Electrical Stimulation;Cryotherapy;Moist Heat   PT Next Visit Plan Address IT band and piriformis tightness as indicated; and continue to focus on LE  strengthening, dynamic balance in standing and with gait      Patient will benefit from skilled therapeutic intervention in order to improve the following deficits and impairments:  Abnormal gait, Decreased balance, Decreased strength, Difficulty walking, Decreased endurance  Visit Diagnosis: Muscle weakness (generalized)  Difficulty in walking, not elsewhere classified  Other abnormalities of gait and mobility  Unsteadiness on feet     Problem List Patient Active Problem List   Diagnosis Date Noted  . SIRS (systemic inflammatory response syndrome) (HCC)   . Weakness of both lower extremities   . Arthritis   . Sepsis secondary to UTI ([place]) 03/18/2015  . Gout attack 10/23/2014  . Toe effusion 05/24/2014  . Anemia in neoplastic disease 04/02/2014  . CKD (chronic kidney disease), stage III 04/02/2014  . Wound infection 03/18/2014  . Cellulitis 03/18/2014  . Acute renal failure ([place]) 03/18/2014  . Leg edema, right   . Open wound of leg 02/22/2014  . Breast cancer, right breast ([place]) 05/13/2011  . Incisional hernia, incarcerated, right upper quadrant 07/13/2010  . HEADACHE 11/06/2009  . CONTACT DERMATITIS 06/13/2009  . FATIGUE 06/13/2009  . EDEMA 05/01/2008  . BACK PAIN 03/29/2008  . CAROTID STENOSIS 03/16/2008  . ANXIETY 11/14/2007  . MENOPAUSAL SYNDROME 09/27/2007  . GLUCOSE INTOLERANCE 08/10/2007  . HYPERLIPIDEMIA 08/10/2007  . PERIPHERAL VASCULAR DISEASE 08/10/2007  . Venous (peripheral) insufficiency 08/10/2007  . Vitamin D deficiency 12/26/2006  . OBESITY 12/26/2006  . DUPUYTREN'S CONTRACTURE 12/26/2006  . Hypothyroidism 11/05/2006  . OSTEOARTHRITIS 11/05/2006  Rulon Eisenmenger, SPTA 07/29/2016, 4:23 PM  [facility] [address] [place], [place], [address] Phone: [phone]   Fax:  [phone]  Name: Rachael [name] MRN: [id number] Date of Birth: [birth year]

## 2016-08-04 ENCOUNTER — Ambulatory Visit: Payer: PPO | Admitting: Physical Therapy

## 2016-08-04 ENCOUNTER — Encounter: Payer: Self-pay | Admitting: Physical Therapy

## 2016-08-04 DIAGNOSIS — R2681 Unsteadiness on feet: Secondary | ICD-10-CM

## 2016-08-04 DIAGNOSIS — R262 Difficulty in walking, not elsewhere classified: Secondary | ICD-10-CM

## 2016-08-04 DIAGNOSIS — M6281 Muscle weakness (generalized): Secondary | ICD-10-CM

## 2016-08-04 DIAGNOSIS — R2689 Other abnormalities of gait and mobility: Secondary | ICD-10-CM

## 2016-08-04 NOTE — Therapy (Signed)
[facility] [address] [place], [place], [address] Phone: [phone]   Fax:  [phone]  Physical Therapy Treatment  Patient Details  Name: Rachael [name] MRN: [id number] Date of Birth: 10-14-1933 Referring Provider: Lahoma Rocker, MD  Encounter Date: 08/04/2016      PT End of Session - 08/04/16 1410    Visit Number 19   Number of Visits 30   Date for PT Re-Evaluation 08/16/16   Authorization Type [employer] G Code every 10th visit   PT Start Time 5573   PT Stop Time 1405   PT Time Calculation (min) 48 min   Equipment Utilized During Treatment Gait belt   Activity Tolerance Patient tolerated treatment well   Behavior During Therapy [facility] for tasks assessed/performed      Past Medical History:  Diagnosis Date  . Breast cancer (HCC)    R  . CAROTID STENOSIS   . Chronic venous insufficiency   . CONTACT DERMATITIS   . DUPUYTREN'S CONTRACTURE   . Edema    pt  states had edema of feet and legs has been on lasix per dr. to help  . GLUCOSE INTOLERANCE   . HLD (hyperlipidemia)   . Hypothyroidism    Dr. Ala Dach in [place]   . MENOPAUSAL SYNDROME   . Obesity   . Osteoarthritis    bilat hips, severe  . PVD (peripheral vascular disease) ([place])    mild carotid 11/05  . VITAMIN D DEFICIENCY     Past Surgical History:  Procedure Laterality Date  . 2D echo  11/29/03   normal LV size. normal EF   . [place]   lumbarectomy  . BREAST BIOPSY  05/11/11   right breast  . BREAST LUMPECTOMY     right breast  . CHOLECYSTECTOMY    . FINGER SURGERY    . HERNIA REPAIR     incisional hernia  . INGUINAL HERNIA REPAIR    . JOINT REPLACEMENT Bilateral    Dr. Maureen Ralphs  . normal caronary arteries     per pt. 2003  . right rotator cuff surgery    . TONSILLECTOMY    . TOTAL HIP ARTHROPLASTY  05/2008 and 10/2008   B hip - alusio    There were no vitals filed for this visit.      Subjective  Assessment - 08/04/16 1322    Subjective Pt reports her legs feel good today, but her R hip is really hurting.  She said she was feeling much better after the [place] last treatment until this past Sunday when her hip started hurting again. She reported she spent Saturday car shopping in [place] and used her [facility] so she could easily get in and out of cars.   Pertinent History 2013 breast CA with lumpectomy, h/o bilat THA, hypothyroid, PVD   Limitations Walking;House hold activities   Patient Stated Goals Walk normally again and not use AD   Currently in Pain? Yes   Pain Score 6    Pain Location Hip   Pain Orientation Right   Pain Descriptors / Indicators Radiating;Burning;Stabbing;Sore   Pain Onset In the past 7 days   Aggravating Factors  with extended activity   Pain Relieving Factors rest           [facility] Adult PT Treatment/Exercise - 08/04/16 1418      Ambulation/Gait   Ambulation/Gait Yes   Ambulation/Gait Assistance 5: Supervision   Ambulation/Gait Assistance Details Pt. navigated over obstacles  using SPC with left leg leading however forward stepping over obstacle increased R hip pain. Side stepping over obstacles with left leg leading was more successful but also resulted in increased R hip pain.   Ambulation Distance (Feet) 40 Feet   Assistive device Straight cane   Gait Pattern Step-through pattern;Decreased trunk rotation;Trunk flexed   Ambulation Surface Level;Indoor     Exercises   Exercises Other Exercises   Other Exercises  Performed passive supine R sciatic nerve glides x 5 minutes; R single knee to chest 30 seconds x3; Pt. used gait belt for R LE assist lift to 90* and performed sciatic nerve glide with DF x3 minutes.      Sciatic nn glides and stretches added due to pt presentation of pain.  Pt very tender to palpation over central gluteal area with pain that radiates down posterior aspect of leg to gastroc mm.  With SLR pt also reported shooting, radicular pain down  posterior LE indicating sciatic nn irritation.             PT Education - 08/04/16 1408    Education provided Yes   Education Details Handout on sciatica; Discussed adding a pillow between the knees when sleeping and addition of 2 stretches to HEP: sciatic nerve glides and single knee to chest to alleviate R hip pain   Person(s) Educated Patient   Methods Explanation;Demonstration;Handout   Comprehension Verbalized understanding;Returned demonstration          PT Short Term Goals - 05/31/16 1024      PT SHORT TERM GOAL #1   Title Will improve five time sit to stand time to 12 seconds without use of UE for improved functional LE strength (TARGET DATE: all STGs now due 06/04/2016 due to missed therapy due to cellulitis)    Baseline 14 seconds without UE; 9.47 seconds on 05/31/2016   Status Achieved     PT SHORT TERM GOAL #2   Title Will decrease falls risk as indicated by decrease in TUG score <15 seconds without RW    Baseline 18.63 without RW; 12.94 seconds without RW on 05/31/2016   Status Achieved     PT SHORT TERM GOAL #3   Title Will ambulate up/down 8 stairs with one rail with alternating method and Mod I   Baseline supervision with 2 rails; supervision with one rail and alternating sequence to ascend, bilat UE support and step to sequence to descend due to RLE buckling today, partially met   Status Partially Met     PT SHORT TERM GOAL #4   Title Pt will perform gait on level, indoor surfaces without RW x 150' with supervision   Baseline Min A without RW; supervision x 230' on indoor, level surface without RW   Status Achieved     PT SHORT TERM GOAL #5   Title Will perform BERG to assess balance deficits and LTG to be set   Baseline Goal met 04/28/16; LTG set   Status Achieved           PT Long Term Goals - 08/04/16 1616      PT LONG TERM GOAL #2   Title (NEW TARGET DATE FOR REMAINING LTG IS 08/16/2016) Pt will improve safety with gait as indicated by increase  in 10 meter walk test/gait velocity to >2.62 ft/sec without AD   Baseline 07/22/16: 2.26 ft/sec without AD; progress but not enough to meet goal yet   Time 4   Period Weeks   Status On-going  PT LONG TERM GOAL #3   Title Will decrease falls risk as indicated by a TUG score of <13 seconds without RW (08/16/16)   Baseline 07/22/16: 16.94 seconds without AD; improved but not enough to meet goal yet   Time 4   Period Weeks   Status On-going     PT LONG TERM GOAL #5   Title Will ambulate over uneven outdoor surfaces x 500 ft. and up/down 8 stairs with SPC and R rail MOD I (08/16/16)   Baseline 1,000 outdoors on uneven pavement with SPC with min guard, 8 stairs with SPC and rail with supervision   Time 4   Period Weeks   Status On-going     PT LONG TERM GOAL #6   Title Pt will decrease falls risk and will improve BERG balance score to >50/56 overall (08/16/16)   Baseline On-going :07/22/16=43/56 indicating significant risk for falls   Time 4   Period Weeks   Status On-going               Plan - 08/04/16 1437    Clinical Impression Statement Today's session focused on R hip pain relief. Pt. reported improved pain relief after last treatment until this past Sunday when hip pain began to return. Attempted short trial of gait navigating forward and laterally over obstacles; however resulted in increased hip pain. Remainder of session focused on finding therapeutic stretches to reduce pain. Supine sciatic nerve glides followed by single knee to chest were effective in reducing pain per pt. report. Pt. was able to stand after stretches without pain. These were added to her HEP. Pt. will benefit from continued skilled PT to progress towards unmet goals.   Clinical Impairments Affecting Rehab Potential 2 year history of LE weakness and steroid use   PT Frequency 2x / week   PT Duration 4 weeks   PT Treatment/Interventions ADLs/Self Care Home Management;Gait training;Stair training;Functional  mobility training;DME Instruction;Therapeutic activities;Therapeutic exercise;Balance training;Neuromuscular re-education;Patient/family education;Passive range of motion;Taping;Ultrasound;Electrical Stimulation;Cryotherapy;Moist Heat   PT Next Visit Plan G code and 10th visit PN DUE!!  Continue to focus on LE strengthening, dynamic balance in standing and with gait as tolerated decreasing dependence on UE with decreased R hip pain   Consulted and Agree with Plan of Care Patient      Patient will benefit from skilled therapeutic intervention in order to improve the following deficits and impairments:  Abnormal gait, Decreased balance, Decreased strength, Difficulty walking, Decreased endurance  Visit Diagnosis: Muscle weakness (generalized)  Difficulty in walking, not elsewhere classified  Other abnormalities of gait and mobility  Unsteadiness on feet     Problem List Patient Active Problem List   Diagnosis Date Noted  . SIRS (systemic inflammatory response syndrome) (HCC)   . Weakness of both lower extremities   . Arthritis   . Sepsis secondary to UTI ([place]) 03/18/2015  . Gout attack 10/23/2014  . Toe effusion 05/24/2014  . Anemia in neoplastic disease 04/02/2014  . CKD (chronic kidney disease), stage III 04/02/2014  . Wound infection 03/18/2014  . Cellulitis 03/18/2014  . Acute renal failure ([place]) 03/18/2014  . Leg edema, right   . Open wound of leg 02/22/2014  . Breast cancer, right breast ([place]) 05/13/2011  . Incisional hernia, incarcerated, right upper quadrant 07/13/2010  . HEADACHE 11/06/2009  . CONTACT DERMATITIS 06/13/2009  . FATIGUE 06/13/2009  . EDEMA 05/01/2008  . BACK PAIN 03/29/2008  . CAROTID STENOSIS 03/16/2008  . ANXIETY 11/14/2007  . MENOPAUSAL SYNDROME 09/27/2007  .  GLUCOSE INTOLERANCE 08/10/2007  . HYPERLIPIDEMIA 08/10/2007  . PERIPHERAL VASCULAR DISEASE 08/10/2007  . Venous (peripheral) insufficiency 08/10/2007  . Vitamin D deficiency 12/26/2006   . OBESITY 12/26/2006  . DUPUYTREN'S CONTRACTURE 12/26/2006  . Hypothyroidism 11/05/2006  . OSTEOARTHRITIS 11/05/2006    Rulon Eisenmenger, SPTA 08/04/2016, 2:38 PM  [facility] [address] [place], [place], [address] Phone: [phone]   Fax:  [phone]  Name: ZAILEY AUDIA MRN: [id number] Date of Birth: Jul 02, 1933

## 2016-08-04 NOTE — Patient Instructions (Signed)
SCIATIC NERVE GLIDE - [place]  Start by lying on your back and holding the back of your knee. Next, attempt to straighten your knee. Lastly, hold this position and then bend your ankle forward and back as shown.   SINGLE KNEE TO CHEST STRETCH - [place]  While Lying on your back, hold your knee and gently pull it up towards your chest.

## 2016-08-06 ENCOUNTER — Ambulatory Visit: Payer: PPO | Admitting: Physical Therapy

## 2016-08-06 DIAGNOSIS — R2681 Unsteadiness on feet: Secondary | ICD-10-CM

## 2016-08-06 DIAGNOSIS — R262 Difficulty in walking, not elsewhere classified: Secondary | ICD-10-CM

## 2016-08-06 DIAGNOSIS — R7309 Other abnormal glucose: Secondary | ICD-10-CM | POA: Diagnosis not present

## 2016-08-06 DIAGNOSIS — E039 Hypothyroidism, unspecified: Secondary | ICD-10-CM | POA: Diagnosis not present

## 2016-08-06 DIAGNOSIS — R2689 Other abnormalities of gait and mobility: Secondary | ICD-10-CM

## 2016-08-06 DIAGNOSIS — H16142 Punctate keratitis, left eye: Secondary | ICD-10-CM | POA: Diagnosis not present

## 2016-08-06 DIAGNOSIS — E063 Autoimmune thyroiditis: Secondary | ICD-10-CM | POA: Diagnosis not present

## 2016-08-06 DIAGNOSIS — M6281 Muscle weakness (generalized): Secondary | ICD-10-CM | POA: Diagnosis not present

## 2016-08-06 DIAGNOSIS — E559 Vitamin D deficiency, unspecified: Secondary | ICD-10-CM | POA: Diagnosis not present

## 2016-08-06 DIAGNOSIS — G729 Myopathy, unspecified: Secondary | ICD-10-CM | POA: Diagnosis not present

## 2016-08-06 DIAGNOSIS — C50819 Malignant neoplasm of overlapping sites of unspecified female breast: Secondary | ICD-10-CM | POA: Diagnosis not present

## 2016-08-06 NOTE — Therapy (Signed)
[facility] [address] [place], [place], [address] Phone: [phone]   Fax:  [phone]  Physical Therapy Treatment  Patient Details  Name: Rachael [name] MRN: [id number] Date of Birth: 03-11-1933 Referring Provider: Lahoma Rocker, MD  Encounter Date: 08/06/2016      PT End of Session - 08/06/16 1357    Visit Number 20   Number of Visits 30   Date for PT Re-Evaluation 08/16/16   Authorization Type [employer] G Code every 10th visit   PT Start Time 1318   PT Stop Time 1357   PT Time Calculation (min) 39 min   Equipment Utilized During Treatment Gait belt   Activity Tolerance Patient limited by pain   Behavior During Therapy [facility] for tasks assessed/performed      Past Medical History:  Diagnosis Date  . Breast cancer (HCC)    R  . CAROTID STENOSIS   . Chronic venous insufficiency   . CONTACT DERMATITIS   . DUPUYTREN'S CONTRACTURE   . Edema    pt  states had edema of feet and legs has been on lasix per dr. to help  . GLUCOSE INTOLERANCE   . HLD (hyperlipidemia)   . Hypothyroidism    Dr. Ala Dach in [place]   . MENOPAUSAL SYNDROME   . Obesity   . Osteoarthritis    bilat hips, severe  . PVD (peripheral vascular disease) ([facility])    mild carotid 11/05  . VITAMIN D DEFICIENCY     Past Surgical History:  Procedure Laterality Date  . 2D echo  11/29/03   normal LV size. normal EF   . Spry   lumbarectomy  . BREAST BIOPSY  05/11/11   right breast  . BREAST LUMPECTOMY     right breast  . CHOLECYSTECTOMY    . FINGER SURGERY    . HERNIA REPAIR     incisional hernia  . INGUINAL HERNIA REPAIR    . JOINT REPLACEMENT Bilateral    Dr. Maureen Ralphs  . normal caronary arteries     per pt. 2003  . right rotator cuff surgery    . TONSILLECTOMY    . TOTAL HIP ARTHROPLASTY  05/2008 and 10/2008   B hip - alusio    There were no vitals filed for this visit.      Subjective Assessment -  08/06/16 1323    Subjective Pt ambulating with the cane but reports she should have the rollator because the RLE is still hurting quite a bit; has been performing exercises at home and reports some improvement; pain has localized to behind the knee.   Pertinent History 2013 breast CA with lumpectomy, h/o bilat THA, hypothyroid, PVD   Limitations Walking;House hold activities   Patient Stated Goals Walk normally again and not use AD   Currently in Pain? Yes   Pain Score 5    Pain Location Leg   Pain Orientation Right;Posterior   Pain Descriptors / Indicators Burning            [facility] PT Assessment - 08/06/16 1345      Standardized Balance Assessment   Standardized Balance Assessment Timed Up and Go Test;Five Times Sit to Stand   Five times sit to stand comments  10.13, without UE support  pt having sciatic pain today; fatigued     Timed Up and Go Test   TUG Normal TUG   Normal TUG (seconds) 12.5   TUG Comments without rollator or cane  but wide BOS and UE guarded due to sciatic pain                     [facility] Adult PT Treatment/Exercise - 08/06/16 1344      Ambulation/Gait   Ambulation/Gait Yes   Ambulation/Gait Assistance 5: Supervision   Ambulation/Gait Assistance Details Pt presenting with increased antalgic gait today with increased lateral lean to L away from R side   Ambulation Distance (Feet) 150 Feet   Assistive device Straight cane   Gait Pattern Step-through pattern;Decreased arm swing - right;Decreased arm swing - left;Decreased step length - right;Decreased step length - left;Decreased stride length;Decreased hip/knee flexion - right;Decreased weight shift to right;Lateral trunk lean to left;Decreased trunk rotation;Trunk flexed;Wide base of support   Ambulation Surface Level;Indoor     Lumbar Exercises: Stretches   Passive Hamstring Stretch 2 reps;60 seconds   Passive Hamstring Stretch Limitations as well as sciatic nn neural gliding x 10 reps   Single  Knee to Chest Stretch 2 reps;60 seconds   Single Knee to Chest Stretch Limitations R side for low back stretch   Piriformis Stretch 2 reps;60 seconds   Piriformis Stretch Limitations LE in external rotation and hip flexion                 PT Education - 08/06/16 1358    Education provided Yes   Education Details continue advised sleeping position and stretches for sciatica; add stretch for piriformis with hip in ER and flexion   Person(s) Educated Patient   Methods Explanation;Demonstration   Comprehension Verbalized understanding;Returned demonstration          PT Short Term Goals - 05/31/16 1024      PT SHORT TERM GOAL #1   Title Will improve five time sit to stand time to 12 seconds without use of UE for improved functional LE strength (TARGET DATE: all STGs now due 06/04/2016 due to missed therapy due to cellulitis)    Baseline 14 seconds without UE; 9.47 seconds on 05/31/2016   Status Achieved     PT SHORT TERM GOAL #2   Title Will decrease falls risk as indicated by decrease in TUG score <15 seconds without RW    Baseline 18.63 without RW; 12.94 seconds without RW on 05/31/2016   Status Achieved     PT SHORT TERM GOAL #3   Title Will ambulate up/down 8 stairs with one rail with alternating method and Mod I   Baseline supervision with 2 rails; supervision with one rail and alternating sequence to ascend, bilat UE support and step to sequence to descend due to RLE buckling today, partially met   Status Partially Met     PT SHORT TERM GOAL #4   Title Pt will perform gait on level, indoor surfaces without RW x 150' with supervision   Baseline Min A without RW; supervision x 230' on indoor, level surface without RW   Status Achieved     PT SHORT TERM GOAL #5   Title Will perform BERG to assess balance deficits and LTG to be set   Baseline Goal met 04/28/16; LTG set   Status Achieved           PT Long Term Goals - 08/04/16 1616      PT LONG TERM GOAL #2    Title (NEW TARGET DATE FOR REMAINING LTG IS 08/16/2016) Pt will improve safety with gait as indicated by increase in 10 meter walk test/gait velocity to >2.62 ft/sec  without AD   Baseline 07/22/16: 2.26 ft/sec without AD; progress but not enough to meet goal yet   Time 4   Period Weeks   Status On-going     PT LONG TERM GOAL #3   Title Will decrease falls risk as indicated by a TUG score of <13 seconds without RW (08/16/16)   Baseline 07/22/16: 16.94 seconds without AD; improved but not enough to meet goal yet   Time 4   Period Weeks   Status On-going     PT LONG TERM GOAL #5   Title Will ambulate over uneven outdoor surfaces x 500 ft. and up/down 8 stairs with SPC and R rail MOD I (08/16/16)   Baseline 1,000 outdoors on uneven pavement with SPC with min guard, 8 stairs with SPC and rail with supervision   Time 4   Period Weeks   Status On-going     PT LONG TERM GOAL #6   Title Pt will decrease falls risk and will improve BERG balance score to >50/56 overall (08/16/16)   Baseline On-going :07/22/16=43/56 indicating significant risk for falls   Time 4   Period Weeks   Status On-going               Plan - Aug 28, 2016 1612    Clinical Impression Statement Treatment session today continued to focus on performing stretches and neural gliding for R sciatica and discussed with pt results of MRI in February and possible need for re-imaging if symptoms to not improve to assess for further compression.  Following stretches and glides pt did report improvement in pain and improved ability to transfer and ambulate.  Continued assessment of progress with five times sit to stand and TUG; pt with slight decrease in five times sit to stand time but still below cut off for increased falls risk and TUG has decrease to 12 seconds indicating lowered falls risk.  Will continue to address and progress towards LTG.   Clinical Impairments Affecting Rehab Potential 2 year history of LE weakness and steroid use   PT  Frequency 2x / week   PT Duration 4 weeks   PT Treatment/Interventions ADLs/Self Care Home Management;Gait training;Stair training;Functional mobility training;DME Instruction;Therapeutic activities;Therapeutic exercise;Balance training;Neuromuscular re-education;Patient/family education;Passive range of motion;Taping;Ultrasound;Electrical Stimulation;Cryotherapy;Moist Heat   PT Next Visit Plan Assess R sciatic pain-if not improved pt may need f/u imaging of low back since MRI in February showed nerve copmression L4/L5 levels; Continue to focus on LE strengthening, dynamic balance in standing and with gait as tolerated decreasing dependence on UE with decreased R hip pain   Consulted and Agree with Plan of Care Patient      Patient will benefit from skilled therapeutic intervention in order to improve the following deficits and impairments:  Abnormal gait, Decreased balance, Decreased strength, Difficulty walking, Decreased endurance  Visit Diagnosis: Muscle weakness (generalized)  Difficulty in walking, not elsewhere classified  Other abnormalities of gait and mobility  Unsteadiness on feet       G-Codes - 08/28/16 1358    Functional Assessment Tool Used (Outpatient Only) TUG and five time sit to stand   Functional Limitation Mobility: Walking and moving around   Mobility: Walking and Moving Around Current Status [phone]) At least 1 percent but less than 20 percent impaired, limited or restricted   Mobility: Walking and Moving Around Goal Status [phone]) At least 1 percent but less than 20 percent impaired, limited or restricted     Physical Therapy Progress Note  Dates of Reporting  Period: 06/03/16 to 08/06/16  Objective Reports of Subjective Statement: See above  Objective Measurements: TUG and five times sit to stand above  Goal Update: See above  Plan: Continue with PT POC and continue to focus on LE strength, balance and gait  Reason Skilled Services are Required: decrease  falls risk and continue to improve functional mobility independence     Problem List Patient Active Problem List   Diagnosis Date Noted  . SIRS (systemic inflammatory response syndrome) (HCC)   . Weakness of both lower extremities   . Arthritis   . Sepsis secondary to UTI ([place]) 03/18/2015  . Gout attack 10/23/2014  . Toe effusion 05/24/2014  . Anemia in neoplastic disease 04/02/2014  . CKD (chronic kidney disease), stage III 04/02/2014  . Wound infection 03/18/2014  . Cellulitis 03/18/2014  . Acute renal failure ([place]) 03/18/2014  . Leg edema, right   . Open wound of leg 02/22/2014  . Breast cancer, right breast ([place]) 05/13/2011  . Incisional hernia, incarcerated, right upper quadrant 07/13/2010  . HEADACHE 11/06/2009  . CONTACT DERMATITIS 06/13/2009  . FATIGUE 06/13/2009  . EDEMA 05/01/2008  . BACK PAIN 03/29/2008  . CAROTID STENOSIS 03/16/2008  . ANXIETY 11/14/2007  . MENOPAUSAL SYNDROME 09/27/2007  . GLUCOSE INTOLERANCE 08/10/2007  . HYPERLIPIDEMIA 08/10/2007  . PERIPHERAL VASCULAR DISEASE 08/10/2007  . Venous (peripheral) insufficiency 08/10/2007  . Vitamin D deficiency 12/26/2006  . OBESITY 12/26/2006  . DUPUYTREN'S CONTRACTURE 12/26/2006  . Hypothyroidism 11/05/2006  . OSTEOARTHRITIS 11/05/2006    Raylene Everts, PT, DPT 08/06/16    4:20 PM    [facility] [address] [place], [place], [address] Phone: [phone]   Fax:  [phone]  Name: JAYDY FITZHENRY MRN: [id number] Date of Birth: [birth year]

## 2016-08-11 ENCOUNTER — Ambulatory Visit: Payer: PPO | Admitting: Physical Therapy

## 2016-08-12 DIAGNOSIS — M545 Low back pain: Secondary | ICD-10-CM | POA: Diagnosis not present

## 2016-08-13 ENCOUNTER — Ambulatory Visit: Payer: PPO | Admitting: Physical Therapy

## 2016-08-17 DIAGNOSIS — T380X5A Adverse effect of glucocorticoids and synthetic analogues, initial encounter: Secondary | ICD-10-CM | POA: Diagnosis not present

## 2016-08-17 DIAGNOSIS — G720 Drug-induced myopathy: Secondary | ICD-10-CM | POA: Diagnosis not present

## 2016-08-17 DIAGNOSIS — M545 Low back pain: Secondary | ICD-10-CM | POA: Diagnosis not present

## 2016-08-23 DIAGNOSIS — M25551 Pain in right hip: Secondary | ICD-10-CM | POA: Diagnosis not present

## 2016-08-23 DIAGNOSIS — Z853 Personal history of malignant neoplasm of breast: Secondary | ICD-10-CM | POA: Diagnosis not present

## 2016-08-23 DIAGNOSIS — M5416 Radiculopathy, lumbar region: Secondary | ICD-10-CM | POA: Diagnosis not present

## 2016-08-23 DIAGNOSIS — M5385 Other specified dorsopathies, thoracolumbar region: Secondary | ICD-10-CM | POA: Diagnosis not present

## 2016-08-23 DIAGNOSIS — S32030A Wedge compression fracture of third lumbar vertebra, initial encounter for closed fracture: Secondary | ICD-10-CM | POA: Diagnosis not present

## 2016-08-23 DIAGNOSIS — R531 Weakness: Secondary | ICD-10-CM | POA: Diagnosis not present

## 2016-08-25 ENCOUNTER — Encounter: Payer: Self-pay | Admitting: Family Medicine

## 2016-08-25 ENCOUNTER — Ambulatory Visit (INDEPENDENT_AMBULATORY_CARE_PROVIDER_SITE_OTHER): Payer: PPO | Admitting: Family Medicine

## 2016-08-25 VITALS — BP 110/58 | Temp 98.8°F

## 2016-08-25 DIAGNOSIS — M5440 Lumbago with sciatica, unspecified side: Secondary | ICD-10-CM

## 2016-08-25 DIAGNOSIS — M48062 Spinal stenosis, lumbar region with neurogenic claudication: Secondary | ICD-10-CM | POA: Diagnosis not present

## 2016-08-25 NOTE — Progress Notes (Signed)
   Subjective:    Patient ID: Rachael [name], female    DOB: 1933-05-04, 83 y.o.   MRN: [id number]  HPI Here with her son for advice on low back pain. She has had steadily worsening low back pain dfor several years, and it now radiates down the legs. She also has worsening numbness and weakness in both legs. She has to walk with a walker now. She had been seeing Dr. Melina Schools at [facility], and shehad a lumbar MRI on 02-25-16. This showed diffuse disc disease and facet arthropathy, but it also showed moderate to severe spinal stenosis at the L3-L4 and the L4-L5 levels. Dr. Rolena Infante recommended only PT for this, and so far this is not helping at all. She then went to see Dr. Gaspar Cola at [facility], and he recommended wearing a TENS and dry needling. Neither of these surgeons advised surgery because of her age. Meanwhile the she is having more and more difficulty walking and getting around.    Review of Systems  Respiratory: Negative.   Cardiovascular: Negative.   Musculoskeletal: Positive for back pain and gait problem.  Neurological: Positive for weakness and numbness.       Objective:   Physical Exam  Constitutional: She is oriented to person, place, and time.  In pain, walks with a walker  Cardiovascular: Normal rate, regular rhythm, normal heart sounds and intact distal pulses.   Pulmonary/Chest: Effort normal and breath sounds normal. No respiratory distress. She has no wheezes. She has no rales.  Neurological: She is alert and oriented to person, place, and time.          Assessment & Plan:  She has low back pain and also classic symptoms of spinal stenosis. I think she maybe a surgical candidate. We will arrange for her to get another opinion from Dr. Maryellen Pile at the [facility].  Alysia Penna, MD

## 2016-08-25 NOTE — Patient Instructions (Signed)
WE NOW OFFER    [facility] FAST TRACK!!!  SAME DAY Appointments for ACUTE CARE  Such as: Sprains, Injuries, cuts, abrasions, rashes, muscle pain, joint pain, back pain Colds, flu, sore throats, headache, allergies, cough, fever  Ear pain, sinus and eye infections Abdominal pain, nausea, vomiting, diarrhea, upset stomach Animal/insect bites  3 Easy Ways to Schedule: Walk-In Scheduling Call in scheduling Mychart Sign-up: [email or web address]         

## 2016-08-26 ENCOUNTER — Encounter: Payer: Self-pay | Admitting: Physical Therapy

## 2016-08-26 ENCOUNTER — Telehealth: Payer: Self-pay | Admitting: Family Medicine

## 2016-08-26 DIAGNOSIS — M48061 Spinal stenosis, lumbar region without neurogenic claudication: Secondary | ICD-10-CM | POA: Insufficient documentation

## 2016-08-26 NOTE — Telephone Encounter (Addendum)
Pt calling to check status of referral to  [facility].  Dr Dimas Chyle Phone [phone] Fax: [phone]

## 2016-08-26 NOTE — Therapy (Signed)
[facility] [address] [place], [place], [address] Phone: [phone]   Fax:  [phone]  Patient Details  Name: Rachael [name] MRN: [id number] Date of Birth: [birth year] Referring Provider:  No ref. provider found  Encounter Date: 09/11/2016  PHYSICAL THERAPY DISCHARGE SUMMARY  Visits from Start of Care: 20  Current functional level related to goals / functional outcomes:  Please see most recent goal update; pt was unable to return to therapy for final visits due to worsening of LE pain and weakness.  Pt to f/u with PCP and spine specialist and will likely return to therapy with new PT order if cleared by physician.     PT Long Term Goals - 08/04/16 1616      PT LONG TERM GOAL #2   Title (NEW TARGET DATE FOR REMAINING LTG IS 08/16/2016) Pt will improve safety with gait as indicated by increase in 10 meter walk test/gait velocity to >2.62 ft/sec without AD   Baseline 07/22/16: 2.26 ft/sec without AD; progress but not enough to meet goal yet   Time 4   Period Weeks   Status On-going     PT LONG TERM GOAL #3   Title Will decrease falls risk as indicated by a TUG score of <13 seconds without RW (08/16/16)   Baseline 07/22/16: 16.94 seconds without AD; improved but not enough to meet goal yet   Time 4   Period Weeks   Status On-going     PT LONG TERM GOAL #5   Title Will ambulate over uneven outdoor surfaces x 500 ft. and up/down 8 stairs with SPC and R rail MOD I (08/16/16)   Baseline 1,000 outdoors on uneven pavement with SPC with min guard, 8 stairs with SPC and rail with supervision   Time 4   Period Weeks   Status On-going     PT LONG TERM GOAL #6   Title Pt will decrease falls risk and will improve BERG balance score to >50/56 overall (08/16/16)   Baseline On-going :07/22/16=43/56 indicating significant risk for falls   Time 4   Period Weeks   Status On-going        Remaining deficits: Pain, LE weakness,  impaired posture, balance and gait   Education / Equipment: HEP  Plan: Patient agrees to discharge.  Patient goals were not met. Patient is being discharged due to a change in medical status.  ?????        G-Codes - September 11, 2016 1901    Functional Assessment Tool Used (Outpatient Only) TUG and five time sit to stand   Functional Limitation Mobility: Walking and moving around   Mobility: Walking and Moving Around Goal Status [phone]) At least 1 percent but less than 20 percent impaired, limited or restricted   Mobility: Walking and Moving Around Discharge Status [phone]) At least 1 percent but less than 20 percent impaired, limited or restricted      Raylene Everts, PT, DPT 09/11/16    7:05 PM    [facility] [address] [place], [place], [address] Phone: [phone]   Fax:  [phone]

## 2016-08-26 NOTE — Telephone Encounter (Signed)
Pt is calling to check the status of the referral stated that Dr. Sarajane Jews told her he was going to be staying late to put it in so that she can keep her appt or get another appt for this month.  The referral was going to be put in so that the insurance will pay for it.

## 2016-08-27 ENCOUNTER — Encounter: Payer: Self-pay | Admitting: Physical Therapy

## 2016-08-27 ENCOUNTER — Ambulatory Visit: Payer: PPO | Attending: Rheumatology | Admitting: Physical Therapy

## 2016-08-27 DIAGNOSIS — R600 Localized edema: Secondary | ICD-10-CM | POA: Diagnosis not present

## 2016-08-27 DIAGNOSIS — R32 Unspecified urinary incontinence: Secondary | ICD-10-CM | POA: Diagnosis not present

## 2016-08-27 DIAGNOSIS — M6281 Muscle weakness (generalized): Secondary | ICD-10-CM | POA: Diagnosis not present

## 2016-08-27 DIAGNOSIS — M47817 Spondylosis without myelopathy or radiculopathy, lumbosacral region: Secondary | ICD-10-CM | POA: Diagnosis not present

## 2016-08-27 DIAGNOSIS — S32038A Other fracture of third lumbar vertebra, initial encounter for closed fracture: Secondary | ICD-10-CM | POA: Diagnosis not present

## 2016-08-27 DIAGNOSIS — Z5181 Encounter for therapeutic drug level monitoring: Secondary | ICD-10-CM | POA: Diagnosis not present

## 2016-08-27 DIAGNOSIS — R208 Other disturbances of skin sensation: Secondary | ICD-10-CM | POA: Diagnosis not present

## 2016-08-27 DIAGNOSIS — R262 Difficulty in walking, not elsewhere classified: Secondary | ICD-10-CM | POA: Diagnosis not present

## 2016-08-27 DIAGNOSIS — M47816 Spondylosis without myelopathy or radiculopathy, lumbar region: Secondary | ICD-10-CM | POA: Diagnosis not present

## 2016-08-27 DIAGNOSIS — S32030A Wedge compression fracture of third lumbar vertebra, initial encounter for closed fracture: Secondary | ICD-10-CM | POA: Diagnosis not present

## 2016-08-27 DIAGNOSIS — M48061 Spinal stenosis, lumbar region without neurogenic claudication: Secondary | ICD-10-CM | POA: Diagnosis not present

## 2016-08-27 DIAGNOSIS — R200 Anesthesia of skin: Secondary | ICD-10-CM | POA: Diagnosis not present

## 2016-08-27 DIAGNOSIS — M549 Dorsalgia, unspecified: Secondary | ICD-10-CM | POA: Diagnosis not present

## 2016-08-27 DIAGNOSIS — M79606 Pain in leg, unspecified: Secondary | ICD-10-CM | POA: Diagnosis not present

## 2016-08-27 DIAGNOSIS — R159 Full incontinence of feces: Secondary | ICD-10-CM | POA: Diagnosis not present

## 2016-08-27 DIAGNOSIS — N289 Disorder of kidney and ureter, unspecified: Secondary | ICD-10-CM | POA: Diagnosis not present

## 2016-08-27 DIAGNOSIS — R001 Bradycardia, unspecified: Secondary | ICD-10-CM | POA: Diagnosis not present

## 2016-08-27 DIAGNOSIS — R531 Weakness: Secondary | ICD-10-CM | POA: Diagnosis not present

## 2016-08-27 DIAGNOSIS — X58XXXA Exposure to other specified factors, initial encounter: Secondary | ICD-10-CM | POA: Diagnosis not present

## 2016-08-27 DIAGNOSIS — M545 Low back pain: Secondary | ICD-10-CM | POA: Diagnosis not present

## 2016-08-27 NOTE — Telephone Encounter (Signed)
Pt wants phone call to confirm we have sent referral to [facility]. Pt says go ahead and send it today whether ins has approved it or not so she can get the soonest appt there. Pt states we can work on ins approval after sending referral to [facility]. Please call pt to confirm today. [phone]. cb

## 2016-08-27 NOTE — Telephone Encounter (Signed)
I spoke with pt, referral has been done. Pt's daughter is taking pt to ER now, pt has lost control of urine & bowels right now. Dr. Sarajane Jews is aware of this does agree to take to ER to evaluate.

## 2016-08-27 NOTE — Telephone Encounter (Signed)
I am not sure what happened to the referral I did yesterday, but today I put in another stat referral to Dr. Maryellen Pile at [facility] for this

## 2016-08-27 NOTE — Therapy (Signed)
[facility] [address] [place], [place], [address] Phone: [phone]   Fax:  [phone]  Physical Therapy Evaluation  Patient Details  Name: Rachael [name] MRN: [id number] Date of Birth: 03-18-1933 Referring Provider: Alysia Penna, MD  Encounter Date: 08/27/2016      PT End of Session - 08/27/16 1340    Visit Number 1   Number of Visits 1  eval only today   Date for PT Re-Evaluation --  eval only today   Authorization Type [employer] G Code every 10th visit   PT Start Time 1104   PT Stop Time 1143   PT Time Calculation (min) 39 min   Activity Tolerance Patient limited by pain;Treatment limited secondary to medical complications (Comment)      Past Medical History:  Diagnosis Date  . Breast cancer (HCC)    R  . CAROTID STENOSIS   . Chronic venous insufficiency   . CONTACT DERMATITIS   . DUPUYTREN'S CONTRACTURE   . Edema    pt  states had edema of feet and legs has been on lasix per dr. to help  . GLUCOSE INTOLERANCE   . HLD (hyperlipidemia)   . Hypothyroidism    Dr. Ala Dach in [place]   . MENOPAUSAL SYNDROME   . Obesity   . Osteoarthritis    bilat hips, severe  . PVD (peripheral vascular disease) ([place])    mild carotid 11/05  . VITAMIN D DEFICIENCY     Past Surgical History:  Procedure Laterality Date  . 2D echo  11/29/03   normal LV size. normal EF   . [place]   lumbarectomy  . BREAST BIOPSY  05/11/11   right breast  . BREAST LUMPECTOMY     right breast  . CHOLECYSTECTOMY    . FINGER SURGERY    . HERNIA REPAIR     incisional hernia  . INGUINAL HERNIA REPAIR    . JOINT REPLACEMENT Bilateral    Dr. Maureen Ralphs  . normal caronary arteries     per pt. 2003  . right rotator cuff surgery    . TONSILLECTOMY    . TOTAL HIP ARTHROPLASTY  05/2008 and 10/2008   B hip - alusio    There were no vitals filed for this visit.           [facility] PT Assessment - 08/27/16 1114       Assessment   Medical Diagnosis Lumbar stenosis   Referring Provider Alysia Penna, MD   Onset Date/Surgical Date 08/23/16   Next MD Visit Spine Specialist   Prior Therapy Neuro outpatient PT recently D/C in July due to worsening of symptoms     Precautions   Precautions Back;Posterior Hip;Fall   Precaution Comments h/o R breast cancer with lumpectomy     Balance Screen   Has the patient fallen in the past 6 months Yes   Has the patient had a decrease in activity level because of a fear of falling?  Yes   Is the patient reluctant to leave their home because of a fear of falling?  Yes     [place] Private residence   Living Arrangements Alone   Available Help at Discharge Family;Available PRN/intermittently   Type of Home House   Home Access Stairs to enter   Entrance Stairs-Number of Steps 5   Entrance Stairs-Rails Right   Home Layout One level   [place] - 4 wheels;Cane -  single point     Prior Function   Level of Independence Requires assistive device for independence;Needs assistance with transfers;Other (comment)  needs children to assist with driving, lift chair to stand     Observation/Other Assessments   Focus on Therapeutic Outcomes (FOTO)  22 (78% limited, predicted 59% limitation)   Other Surveys  Other Surveys   Oswestry Disability Index  Low Back Pain: 74%     Sensation   Light Touch Impaired Detail   Light Touch Impaired Details Impaired RLE;Impaired LLE   Additional Comments Pt reports 3 months urinary incontinence and reports losing control of bowels yesterday and was unaware. Pt also reports she has been dropping a lot of items.     ROM / Strength   AROM / PROM / Strength Strength     Strength   Overall Strength Deficits   Overall Strength Comments bilat hip flexion 1-2/5, knee extension 2-3/5, ankle DF 3/5, knee flexion 3/5     Ambulation/Gait   Ambulation/Gait Yes   Ambulation/Gait Assistance 4: Min assist    Ambulation Distance (Feet) 120 Feet   Assistive device Rollator   Gait Pattern Step-through pattern;Decreased step length - right;Decreased step length - left;Decreased stride length;Decreased hip/knee flexion - right;Decreased hip/knee flexion - left;Decreased dorsiflexion - right;Decreased dorsiflexion - left;Trunk flexed;Poor foot clearance - left;Poor foot clearance - right   Ambulation Surface Level;Indoor   Gait velocity 38.9 seconds or .84 ft/sec            Objective measurements completed on examination: See above findings.                  PT Education - 08/27/16 1334    Education provided Yes   Education Details clinical findings and PT's concerns about progression of weakness, numbness, pain, loss of function and incontinence.  Pt and daughter advised to proceed to ED in light of new issues including loss of control of bowels.   Person(s) Educated Patient;Child(ren)   Methods Explanation   Comprehension Verbalized understanding               Plan - 08/27/16 1351    Clinical Impression Statement Pt is a 83 year old female returns to [facility] neuro for PT evaluation for radicular low back pain, LE weakness and difficulty walking.  Recent MRI demonstrates "multilevel lumbar spondylosis, there is moderate to advanced canal stenosis at the level of L4/L5, there is bilateral L5 nerve root compression within the subarticular zones".  Pt was participating in PT previously for LE pain/weakness and due to worsening of symptoms pt was D/C until examined by physician and cleared for further PT.  Pt has visited multiple physicians including spine specialist who referred pt back to PT.  Spine specialist did not feel pt was a surgical candidate but pt is seeking second opinion from spine surgeon specialist at [facility].  Pt's PMH significant for the following: bilat THA, R rotator cuff surgery, lumbarectomy, vitamin D deficiency, PVD, OA, hypothyroidism, dupuytren's contracture,  carotid stenosis and R breast cancer with lumpectomy. The following deficits were noted during pt's exam: progression of LE numbness, weakness and radicular pain from previous episode of care, impaired gait, balance, and bowel and bladder incontinence.  At initial evaluation in January pt presented with normal sensation, 4+/5 LE strength and gait speed of 2.12 ft/sec.  Today pt reporting numbness and decreased sensation to light touch and pin prick, LE strength 2-3/5, and gait speed of .84 ft/sec indicating significant decline and high falls  risk.  Due to progressive nature of pt's symptoms and new c/o bowel and bladder incontinence pt's condition is unstable and would not be safe to continue with PT at this time.  Pt advised to proceed to hospital due to severity of symptoms for further evaluation.  Pt and daughter agree with this decision.  Pt seen for evaluation only.     History and Personal Factors relevant to plan of care: h/o breast cancer, MRI findings of spinal stenosis in lumbar spine, progressive LE radicular pain, weakness, numbness, inability to ambulate, negotiate stairs or stand without assistance, loss of bowel and bladder control   Clinical Presentation Unstable   Clinical Presentation due to: MRI findings of spinal stenosis in lumbar spine, progressive LE radicular pain, weakness, numbness, inability to ambulate, negotiate stairs or stand without assistance, loss of bowel and bladder control   Clinical Decision Making High   PT Frequency One time visit   PT Duration Other (comment)  eval only today   PT Next Visit Plan eval only today   Consulted and Agree with Plan of Care Patient;Family member/caregiver       Visit Diagnosis: Muscle weakness (generalized)  Difficulty in walking, not elsewhere classified  Other disturbances of skin sensation      G-Codes - 09/07/16 1356    Functional Assessment Tool Used (Outpatient Only) clinical assessment, FOTO   Functional Limitation  Mobility: Walking and moving around   Mobility: Walking and Moving Around Current Status [phone]) At least 80 percent but less than 100 percent impaired, limited or restricted   Mobility: Walking and Moving Around Goal Status [phone]) At least 80 percent but less than 100 percent impaired, limited or restricted   Mobility: Walking and Moving Around Discharge Status [phone]) At least 80 percent but less than 100 percent impaired, limited or restricted       Problem List Patient Active Problem List   Diagnosis Date Noted  . Spinal stenosis, lumbar 08/26/2016  . SIRS (systemic inflammatory response syndrome) (HCC)   . Weakness of both lower extremities   . Arthritis   . Sepsis secondary to UTI ([place]) 03/18/2015  . Gout attack 10/23/2014  . Toe effusion 05/24/2014  . Anemia in neoplastic disease 04/02/2014  . CKD (chronic kidney disease), stage III 04/02/2014  . Wound infection 03/18/2014  . Cellulitis 03/18/2014  . Acute renal failure ([place]) 03/18/2014  . Leg edema, right   . Open wound of leg 02/22/2014  . Breast cancer, right breast ([place]) 05/13/2011  . Incisional hernia, incarcerated, right upper quadrant 07/13/2010  . HEADACHE 11/06/2009  . CONTACT DERMATITIS 06/13/2009  . FATIGUE 06/13/2009  . EDEMA 05/01/2008  . Backache 03/29/2008  . CAROTID STENOSIS 03/16/2008  . ANXIETY 11/14/2007  . MENOPAUSAL SYNDROME 09/27/2007  . GLUCOSE INTOLERANCE 08/10/2007  . HYPERLIPIDEMIA 08/10/2007  . PERIPHERAL VASCULAR DISEASE 08/10/2007  . Venous (peripheral) insufficiency 08/10/2007  . Vitamin D deficiency 12/26/2006  . OBESITY 12/26/2006  . DUPUYTREN'S CONTRACTURE 12/26/2006  . Hypothyroidism 11/05/2006  . OSTEOARTHRITIS 11/05/2006    Raylene Everts, PT, DPT 09/07/2016    2:05 PM    [facility] [address] [place], [place], [address] Phone: [phone]   Fax:  [phone]  Name: Rachael [name] MRN: [id number] Date of  Birth: 04-Jan-1934

## 2016-08-27 NOTE — Addendum Note (Signed)
Addended by: Alysia Penna A on: 08/27/2016 12:20 PM   Modules accepted: Orders

## 2016-08-28 DIAGNOSIS — M549 Dorsalgia, unspecified: Secondary | ICD-10-CM | POA: Diagnosis not present

## 2016-08-28 DIAGNOSIS — S32030A Wedge compression fracture of third lumbar vertebra, initial encounter for closed fracture: Secondary | ICD-10-CM | POA: Diagnosis not present

## 2016-08-31 ENCOUNTER — Ambulatory Visit: Payer: PPO | Admitting: Physical Therapy

## 2016-09-03 ENCOUNTER — Ambulatory Visit: Payer: PPO | Admitting: Physical Therapy

## 2016-09-07 DIAGNOSIS — S32030A Wedge compression fracture of third lumbar vertebra, initial encounter for closed fracture: Secondary | ICD-10-CM | POA: Diagnosis not present

## 2016-09-07 DIAGNOSIS — M48062 Spinal stenosis, lumbar region with neurogenic claudication: Secondary | ICD-10-CM | POA: Diagnosis not present

## 2016-09-07 DIAGNOSIS — R29898 Other symptoms and signs involving the musculoskeletal system: Secondary | ICD-10-CM | POA: Diagnosis not present

## 2016-09-07 DIAGNOSIS — M48061 Spinal stenosis, lumbar region without neurogenic claudication: Secondary | ICD-10-CM | POA: Diagnosis not present

## 2016-09-07 DIAGNOSIS — M5416 Radiculopathy, lumbar region: Secondary | ICD-10-CM | POA: Diagnosis not present

## 2016-09-09 ENCOUNTER — Telehealth: Payer: Self-pay | Admitting: *Deleted

## 2016-09-09 DIAGNOSIS — N958 Other specified menopausal and perimenopausal disorders: Secondary | ICD-10-CM | POA: Diagnosis not present

## 2016-09-09 NOTE — Telephone Encounter (Signed)
This RN returned pt's call who states she was finally able to speak with the [facility] and has an appointment.  Per her insurance bone density is covered and pt does not need an MD order ( self referral appropriate ).  Emmalynn stated concern due to her multiple calls yesterday and today to the [facility] which " were disconnected "- she was finally able to get thru to schedule the bone density.  Note Marthella states she will have results of bone density as well as spine procedure sent to Dr Jannifer Rodney.  No other needs at this time.

## 2016-09-09 NOTE — Telephone Encounter (Signed)
"  Questions about when was my last bone density and who did it?  Can these be performed every two years?"  Spoke with her providing the following information.  Most recent bone density exam performed 09-24-2013 at [facility].  Overdue for next exam.  Order placed by Gentry Fitz NP on 10-01-2014 to have bone Density expected for 09-26-2015 has expired.  No pending Bone density orders.  Scheduled F/U with Dr. Jana Hakim due 10-21-2016.  "I'm scheduled for Bone Density today at 3:00 pm at [facility].  I saw my GYN, Dr. Helane Rima this morning.  I have a fractured vertebrae.  Will have to cement that so need to make sure my bones are sturdy.  I believe the numbers were better with my last exam.  Scheduled Friday to see Dr. Helane Rima again so I expect the cementing to be done in the next week or so.  I will try to have today's office note and Bone Density results sent to Dr. Jana Hakim.  [facility].  Tried calling [facility] but call was dropped twelve times.  Thanks."  Routing call information to collaborative nurse and provider for review.  Further patient communication through collaborative nurse.

## 2016-09-11 DIAGNOSIS — R29898 Other symptoms and signs involving the musculoskeletal system: Secondary | ICD-10-CM | POA: Diagnosis not present

## 2016-09-11 DIAGNOSIS — M47814 Spondylosis without myelopathy or radiculopathy, thoracic region: Secondary | ICD-10-CM | POA: Diagnosis not present

## 2016-09-11 DIAGNOSIS — M47812 Spondylosis without myelopathy or radiculopathy, cervical region: Secondary | ICD-10-CM | POA: Diagnosis not present

## 2016-09-11 DIAGNOSIS — M48062 Spinal stenosis, lumbar region with neurogenic claudication: Secondary | ICD-10-CM | POA: Diagnosis not present

## 2016-09-17 DIAGNOSIS — S32030G Wedge compression fracture of third lumbar vertebra, subsequent encounter for fracture with delayed healing: Secondary | ICD-10-CM | POA: Diagnosis not present

## 2016-09-17 DIAGNOSIS — M4316 Spondylolisthesis, lumbar region: Secondary | ICD-10-CM | POA: Diagnosis not present

## 2016-09-17 DIAGNOSIS — S32039D Unspecified fracture of third lumbar vertebra, subsequent encounter for fracture with routine healing: Secondary | ICD-10-CM | POA: Diagnosis not present

## 2016-09-17 DIAGNOSIS — Z853 Personal history of malignant neoplasm of breast: Secondary | ICD-10-CM | POA: Diagnosis not present

## 2016-09-30 ENCOUNTER — Encounter: Payer: Self-pay | Admitting: Family Medicine

## 2016-10-01 DIAGNOSIS — Z780 Asymptomatic menopausal state: Secondary | ICD-10-CM | POA: Diagnosis not present

## 2016-10-01 DIAGNOSIS — Z7952 Long term (current) use of systemic steroids: Secondary | ICD-10-CM | POA: Diagnosis not present

## 2016-10-01 DIAGNOSIS — E039 Hypothyroidism, unspecified: Secondary | ICD-10-CM | POA: Diagnosis not present

## 2016-10-01 DIAGNOSIS — Z79899 Other long term (current) drug therapy: Secondary | ICD-10-CM | POA: Diagnosis not present

## 2016-10-01 DIAGNOSIS — M4856XA Collapsed vertebra, not elsewhere classified, lumbar region, initial encounter for fracture: Secondary | ICD-10-CM | POA: Diagnosis not present

## 2016-10-01 DIAGNOSIS — G609 Hereditary and idiopathic neuropathy, unspecified: Secondary | ICD-10-CM | POA: Diagnosis not present

## 2016-10-01 DIAGNOSIS — S32030G Wedge compression fracture of third lumbar vertebra, subsequent encounter for fracture with delayed healing: Secondary | ICD-10-CM | POA: Diagnosis not present

## 2016-10-01 DIAGNOSIS — Z853 Personal history of malignant neoplasm of breast: Secondary | ICD-10-CM | POA: Diagnosis not present

## 2016-10-01 DIAGNOSIS — S32030A Wedge compression fracture of third lumbar vertebra, initial encounter for closed fracture: Secondary | ICD-10-CM | POA: Diagnosis not present

## 2016-10-08 ENCOUNTER — Telehealth: Payer: Self-pay | Admitting: Family Medicine

## 2016-10-08 DIAGNOSIS — S32030G Wedge compression fracture of third lumbar vertebra, subsequent encounter for fracture with delayed healing: Secondary | ICD-10-CM | POA: Diagnosis not present

## 2016-10-08 NOTE — Telephone Encounter (Signed)
Let's stay at the 2 mg dose for the time being and see how she does

## 2016-10-08 NOTE — Telephone Encounter (Signed)
I spoke with pt and went over below advice. 

## 2016-10-08 NOTE — Telephone Encounter (Signed)
Pt is tapering off prednisone and now 1 MG and unable to use her legs.  Pt state that it has made a big difference in her tapering off from 2 MG to 1 MG and want to know if she should continue to take the 2 MG and then go to 1.5 MG.  Pt need to have a call back to explain in detail.

## 2016-10-09 DIAGNOSIS — R159 Full incontinence of feces: Secondary | ICD-10-CM | POA: Diagnosis not present

## 2016-10-09 DIAGNOSIS — M5126 Other intervertebral disc displacement, lumbar region: Secondary | ICD-10-CM | POA: Diagnosis not present

## 2016-10-09 DIAGNOSIS — R531 Weakness: Secondary | ICD-10-CM | POA: Diagnosis not present

## 2016-10-09 DIAGNOSIS — G609 Hereditary and idiopathic neuropathy, unspecified: Secondary | ICD-10-CM | POA: Diagnosis not present

## 2016-10-09 DIAGNOSIS — R200 Anesthesia of skin: Secondary | ICD-10-CM | POA: Diagnosis not present

## 2016-10-09 DIAGNOSIS — M353 Polymyalgia rheumatica: Secondary | ICD-10-CM | POA: Diagnosis not present

## 2016-10-09 DIAGNOSIS — R5381 Other malaise: Secondary | ICD-10-CM | POA: Diagnosis not present

## 2016-10-09 DIAGNOSIS — T380X5A Adverse effect of glucocorticoids and synthetic analogues, initial encounter: Secondary | ICD-10-CM | POA: Diagnosis not present

## 2016-10-09 DIAGNOSIS — Z853 Personal history of malignant neoplasm of breast: Secondary | ICD-10-CM | POA: Diagnosis not present

## 2016-10-09 DIAGNOSIS — E039 Hypothyroidism, unspecified: Secondary | ICD-10-CM | POA: Diagnosis not present

## 2016-10-09 DIAGNOSIS — R32 Unspecified urinary incontinence: Secondary | ICD-10-CM | POA: Diagnosis not present

## 2016-10-09 DIAGNOSIS — M4856XA Collapsed vertebra, not elsewhere classified, lumbar region, initial encounter for fracture: Secondary | ICD-10-CM | POA: Diagnosis not present

## 2016-10-09 DIAGNOSIS — M47896 Other spondylosis, lumbar region: Secondary | ICD-10-CM | POA: Diagnosis not present

## 2016-10-09 DIAGNOSIS — M6281 Muscle weakness (generalized): Secondary | ICD-10-CM | POA: Diagnosis not present

## 2016-10-09 DIAGNOSIS — G8918 Other acute postprocedural pain: Secondary | ICD-10-CM | POA: Diagnosis not present

## 2016-10-09 DIAGNOSIS — R29898 Other symptoms and signs involving the musculoskeletal system: Secondary | ICD-10-CM | POA: Diagnosis not present

## 2016-10-09 DIAGNOSIS — M48061 Spinal stenosis, lumbar region without neurogenic claudication: Secondary | ICD-10-CM | POA: Diagnosis not present

## 2016-10-09 DIAGNOSIS — N281 Cyst of kidney, acquired: Secondary | ICD-10-CM | POA: Diagnosis not present

## 2016-10-09 DIAGNOSIS — Z7952 Long term (current) use of systemic steroids: Secondary | ICD-10-CM | POA: Diagnosis not present

## 2016-10-09 DIAGNOSIS — S32030G Wedge compression fracture of third lumbar vertebra, subsequent encounter for fracture with delayed healing: Secondary | ICD-10-CM | POA: Diagnosis not present

## 2016-10-09 DIAGNOSIS — G720 Drug-induced myopathy: Secondary | ICD-10-CM | POA: Diagnosis not present

## 2016-10-13 ENCOUNTER — Telehealth: Payer: Self-pay | Admitting: *Deleted

## 2016-10-13 NOTE — Telephone Encounter (Signed)
I reviewed her medications and the only one I see that may be contributing to the diarrhea is the Protonix. I suggest she stop this and try Zantac 150 mg (OTC) instead.

## 2016-10-13 NOTE — Telephone Encounter (Signed)
Patient has an upcoming appointment with Dr Sarajane Jews for a hos follow up, and patient states she has had diarrhea since she has left the hospital and patient is taking some medications that she is unsure if it cause it. Patient doesn't want anything to be called in for this, patient states she would like to go over it with the nurse to see what she can do or stop taking whatever it could be making her have diarrhea. Please advise

## 2016-10-14 DIAGNOSIS — Z7952 Long term (current) use of systemic steroids: Secondary | ICD-10-CM | POA: Diagnosis not present

## 2016-10-14 DIAGNOSIS — S32030G Wedge compression fracture of third lumbar vertebra, subsequent encounter for fracture with delayed healing: Secondary | ICD-10-CM | POA: Diagnosis not present

## 2016-10-14 DIAGNOSIS — G729 Myopathy, unspecified: Secondary | ICD-10-CM | POA: Diagnosis not present

## 2016-10-15 ENCOUNTER — Encounter: Payer: Self-pay | Admitting: Family Medicine

## 2016-10-15 ENCOUNTER — Ambulatory Visit (INDEPENDENT_AMBULATORY_CARE_PROVIDER_SITE_OTHER): Payer: PPO | Admitting: Family Medicine

## 2016-10-15 VITALS — BP 114/58 | Temp 98.3°F

## 2016-10-15 DIAGNOSIS — R29898 Other symptoms and signs involving the musculoskeletal system: Secondary | ICD-10-CM | POA: Diagnosis not present

## 2016-10-15 DIAGNOSIS — M48061 Spinal stenosis, lumbar region without neurogenic claudication: Secondary | ICD-10-CM | POA: Diagnosis not present

## 2016-10-15 DIAGNOSIS — H6121 Impacted cerumen, right ear: Secondary | ICD-10-CM | POA: Diagnosis not present

## 2016-10-15 NOTE — Patient Instructions (Signed)
WE NOW OFFER   [facility] FAST TRACK!!!  SAME DAY Appointments for ACUTE CARE  Such as: Sprains, Injuries, cuts, abrasions, rashes, muscle pain, joint pain, back pain Colds, flu, sore throats, headache, allergies, cough, fever  Ear pain, sinus and eye infections Abdominal pain, nausea, vomiting, diarrhea, upset stomach Animal/insect bites  3 Easy Ways to Schedule: Walk-In Scheduling Call in scheduling Mychart Sign-up: [email or web address]         

## 2016-10-15 NOTE — Telephone Encounter (Signed)
I spoke with pt and went over information.

## 2016-10-15 NOTE — Progress Notes (Signed)
   Subjective:    Patient ID: Rachael [name], female    DOB: 17-May-1933, 83 y.o.   MRN: [id number]  HPI Here with her son to follow up after a hospital stay at [facility] from 10-09-16 to 10-11-16 for leg weakness, a flare of her PMR, and back pain. She had attempted to decrease her Prednsione dose from 2 mg daily to 1 mg, but her symptoms flared up. She was put back on 2 mg daily and told to take Tylenol. She is now taking Tylenol 500 mg 4 times a day. She has stopped Gabapentin. She has stopped Protonix because she was afraid this will thin her bones. Today she feels much better with more leg strength and less pain. She is able to walk with a walker. She does complain of decreased hearing in the right ear. No discomfort.    Review of Systems  HENT: Positive for hearing loss. Negative for ear discharge and ear pain.   Respiratory: Negative.   Cardiovascular: Negative.   Gastrointestinal: Negative.   Musculoskeletal: Positive for back pain.  Neurological: Positive for weakness.       Objective:   Physical Exam  Constitutional: She is oriented to person, place, and time. She appears well-developed and well-nourished.  HENT:  Right ear canal is blocked with cerumen. Left ear is clear   Neck: No thyromegaly present.  Cardiovascular: Normal rate, regular rhythm, normal heart sounds and intact distal pulses.   Pulmonary/Chest: Effort normal and breath sounds normal. No respiratory distress. She has no wheezes. She has no rales.  Lymphadenopathy:    She has no cervical adenopathy.  Neurological: She is alert and oriented to person, place, and time.          Assessment & Plan:  She has improved after a recent flare of back pain and PMR resulting from an attempt to wean off Prednisone. She is back on Prednisone 2 mg daily. She will see her Rheumatologist, Dr. Lorenza Cambridge, at [facility] on 10-19-16, and I believe she will try to come up with a way to wean Rachael [name] off steroids. She does have some underlying  lumbar stenosis but I think most of her leg weakness is the result of a rheumatologic process and not the stenosis. We has stopped Protonix, so I suggested she take Zantac 150 mg daily instead. Her ear cerumen was irrigated clear with water.  Rachael Penna, MD

## 2016-10-19 DIAGNOSIS — R29898 Other symptoms and signs involving the musculoskeletal system: Secondary | ICD-10-CM | POA: Diagnosis not present

## 2016-10-19 DIAGNOSIS — R531 Weakness: Secondary | ICD-10-CM | POA: Diagnosis not present

## 2016-10-19 DIAGNOSIS — Z7952 Long term (current) use of systemic steroids: Secondary | ICD-10-CM | POA: Diagnosis not present

## 2016-10-19 DIAGNOSIS — G720 Drug-induced myopathy: Secondary | ICD-10-CM | POA: Diagnosis not present

## 2016-10-19 DIAGNOSIS — T380X5A Adverse effect of glucocorticoids and synthetic analogues, initial encounter: Secondary | ICD-10-CM | POA: Diagnosis not present

## 2016-10-21 ENCOUNTER — Ambulatory Visit: Payer: PPO | Admitting: Oncology

## 2016-10-21 ENCOUNTER — Other Ambulatory Visit: Payer: PPO

## 2016-10-21 ENCOUNTER — Telehealth: Payer: Self-pay | Admitting: Oncology

## 2016-10-21 NOTE — Telephone Encounter (Signed)
Patient canceled 10/11 appointments.

## 2016-10-22 DIAGNOSIS — Z9889 Other specified postprocedural states: Secondary | ICD-10-CM | POA: Diagnosis not present

## 2016-10-22 DIAGNOSIS — R32 Unspecified urinary incontinence: Secondary | ICD-10-CM | POA: Diagnosis not present

## 2016-10-22 DIAGNOSIS — Z853 Personal history of malignant neoplasm of breast: Secondary | ICD-10-CM | POA: Diagnosis not present

## 2016-10-22 DIAGNOSIS — S32030G Wedge compression fracture of third lumbar vertebra, subsequent encounter for fracture with delayed healing: Secondary | ICD-10-CM | POA: Diagnosis not present

## 2016-10-22 DIAGNOSIS — R531 Weakness: Secondary | ICD-10-CM | POA: Diagnosis not present

## 2016-10-26 DIAGNOSIS — M48061 Spinal stenosis, lumbar region without neurogenic claudication: Secondary | ICD-10-CM | POA: Diagnosis not present

## 2016-10-26 DIAGNOSIS — M5416 Radiculopathy, lumbar region: Secondary | ICD-10-CM | POA: Diagnosis not present

## 2016-11-02 DIAGNOSIS — R7309 Other abnormal glucose: Secondary | ICD-10-CM | POA: Diagnosis not present

## 2016-11-02 DIAGNOSIS — E039 Hypothyroidism, unspecified: Secondary | ICD-10-CM | POA: Diagnosis not present

## 2016-11-02 DIAGNOSIS — E063 Autoimmune thyroiditis: Secondary | ICD-10-CM | POA: Diagnosis not present

## 2016-11-02 DIAGNOSIS — E559 Vitamin D deficiency, unspecified: Secondary | ICD-10-CM | POA: Diagnosis not present

## 2016-11-02 DIAGNOSIS — E538 Deficiency of other specified B group vitamins: Secondary | ICD-10-CM | POA: Diagnosis not present

## 2016-11-03 ENCOUNTER — Telehealth: Payer: Self-pay | Admitting: Family Medicine

## 2016-11-03 DIAGNOSIS — M8000XD Age-related osteoporosis with current pathological fracture, unspecified site, subsequent encounter for fracture with routine healing: Secondary | ICD-10-CM

## 2016-11-03 NOTE — Telephone Encounter (Addendum)
Pt states her dr,  Dr Georgiann Cocker at [facility] spine center (He did pt's cementing of the vertebra) Would like Dr Sarajane Jews to call him on his cell phone: Cell : [phone]  Would like Dr Sarajane Jews to call and discuss which test pt should have done concerning her long term use of prednisone and how it has affected her bones.  Pt states she will need an appt after Dr Sarajane Jews calls him, but will wait until after this conversation.

## 2016-11-03 NOTE — Telephone Encounter (Signed)
There is no need for me to speak to this provider. The most appropriate test would be a bone density test (DEXA) which she is due for anyway. I will put in the order for this, and our schedulers will set it up

## 2016-11-04 NOTE — Telephone Encounter (Signed)
I spoke with pt, explained to pt that we do not order lab work for other providers. She will contact that provider back.

## 2016-11-04 NOTE — Telephone Encounter (Signed)
Patient called stating that she has already had a bone density test performed in August and that  Dr. Georgiann Cocker is requesting another type of test be performed. She states Dr. Georgiann Cocker would like to receive a call at the number below.  [phone]

## 2016-11-04 NOTE — Telephone Encounter (Signed)
I left a voice message for pt with below information.  

## 2016-11-04 NOTE — Telephone Encounter (Signed)
Can you make sure pt is scheduled for bone density?

## 2016-11-08 ENCOUNTER — Telehealth: Payer: Self-pay | Admitting: Oncology

## 2016-11-08 NOTE — Telephone Encounter (Signed)
Rescheduled patients 10/11 appointments to 11/15.

## 2016-11-11 ENCOUNTER — Ambulatory Visit (INDEPENDENT_AMBULATORY_CARE_PROVIDER_SITE_OTHER): Payer: PPO | Admitting: Family Medicine

## 2016-11-11 ENCOUNTER — Encounter: Payer: Self-pay | Admitting: Family Medicine

## 2016-11-11 VITALS — BP 130/72 | HR 62 | Temp 98.3°F

## 2016-11-11 DIAGNOSIS — R29898 Other symptoms and signs involving the musculoskeletal system: Secondary | ICD-10-CM | POA: Diagnosis not present

## 2016-11-11 DIAGNOSIS — M353 Polymyalgia rheumatica: Secondary | ICD-10-CM | POA: Insufficient documentation

## 2016-11-11 DIAGNOSIS — M48061 Spinal stenosis, lumbar region without neurogenic claudication: Secondary | ICD-10-CM | POA: Diagnosis not present

## 2016-11-11 DIAGNOSIS — E039 Hypothyroidism, unspecified: Secondary | ICD-10-CM

## 2016-11-11 MED ORDER — TRAMADOL HCL 50 MG PO TABS: 50.0000 mg | ORAL_TABLET | Freq: Four times a day (QID) | ORAL | 2 refills | Status: DC | PRN

## 2016-11-11 NOTE — Progress Notes (Signed)
   Subjective:    Patient ID: Rachael [name], female    DOB: June 18, 1933, 83 y.o.   MRN: [id number]  HPI Here to follow up on several issues. She has been seeing Dr. Rogers Blocker, her rheumatologist, about the PMR. She is currently on 2 mg daily of prednisone and she has added Meloxicam 15 mg daily. This has helped her joint and back pain somewhat. They hope to slowly wean her off the prednisone. She has also seen Dr. Marcos Eke, her neurosurgeon, about the lumbar spinal stenosis. He has been very reluctant to perform surgery on her spine due to her age and the fact that she is on longterm prednisone. She had a bone density test per Dr. Helane Rima, her GYN, on 09-09-16 showing normal bone density. She is still struggling with whether to pursue surgery or not.    Review of Systems  Respiratory: Negative.   Cardiovascular: Negative.   Gastrointestinal: Negative.   Musculoskeletal: Positive for back pain and gait problem.  Neurological: Positive for weakness and numbness.       Objective:   Physical Exam  Constitutional: She is oriented to person, place, and time.  Walks with a walker   Neck: No thyromegaly present.  Cardiovascular: Normal rate, regular rhythm, normal heart sounds and intact distal pulses.   Pulmonary/Chest: Effort normal and breath sounds normal. No respiratory distress. She has no wheezes. She has no rales.  Lymphadenopathy:    She has no cervical adenopathy.  Neurological: She is alert and oriented to person, place, and time.          Assessment & Plan:  Her PMR is stable but they have had difficulty getting her off steroids. They will continue to work on this. As for lumbar spine surgery, we will have to see what the future brings. I agree a surgery would quite risky at this point. She plans to establish with a Dr. Ala Dach in [place], [place] for an Endocrine consultation on 11-23-16.  Alysia Penna, MD

## 2016-11-11 NOTE — Progress Notes (Signed)
Tramadol rx faxed to pharmacy as requested. Fax confirmation received.

## 2016-11-12 DIAGNOSIS — E039 Hypothyroidism, unspecified: Secondary | ICD-10-CM | POA: Diagnosis not present

## 2016-11-12 DIAGNOSIS — E559 Vitamin D deficiency, unspecified: Secondary | ICD-10-CM | POA: Diagnosis not present

## 2016-11-12 DIAGNOSIS — E063 Autoimmune thyroiditis: Secondary | ICD-10-CM | POA: Diagnosis not present

## 2016-11-12 DIAGNOSIS — R7309 Other abnormal glucose: Secondary | ICD-10-CM | POA: Diagnosis not present

## 2016-11-12 DIAGNOSIS — G729 Myopathy, unspecified: Secondary | ICD-10-CM | POA: Diagnosis not present

## 2016-11-12 DIAGNOSIS — C50819 Malignant neoplasm of overlapping sites of unspecified female breast: Secondary | ICD-10-CM | POA: Diagnosis not present

## 2016-11-15 ENCOUNTER — Telehealth: Payer: Self-pay | Admitting: Family Medicine

## 2016-11-15 ENCOUNTER — Other Ambulatory Visit: Payer: Self-pay | Admitting: Oncology

## 2016-11-15 DIAGNOSIS — Z853 Personal history of malignant neoplasm of breast: Secondary | ICD-10-CM

## 2016-11-15 NOTE — Telephone Encounter (Signed)
Pt is taking tramadol every 8 hrs for the pain and would like to know if she can take med more often due to back pain

## 2016-11-16 NOTE — Telephone Encounter (Signed)
Pt advised and voiced understanding.   

## 2016-11-16 NOTE — Telephone Encounter (Signed)
Yes she can take this every 6 hours prn

## 2016-11-16 NOTE — Telephone Encounter (Signed)
Called pt and left a VM to call back so I could relay Dr. Barbie Banner message with pt.

## 2016-11-18 ENCOUNTER — Other Ambulatory Visit: Payer: Self-pay | Admitting: *Deleted

## 2016-11-18 ENCOUNTER — Encounter: Payer: Self-pay | Admitting: *Deleted

## 2016-11-18 ENCOUNTER — Ambulatory Visit
Admission: RE | Admit: 2016-11-18 | Discharge: 2016-11-18 | Disposition: A | Discharge status: Home / Self Care | Payer: PPO | Source: Ambulatory Visit | Attending: Oncology | Admitting: Oncology

## 2016-11-18 DIAGNOSIS — Z853 Personal history of malignant neoplasm of breast: Secondary | ICD-10-CM

## 2016-11-18 DIAGNOSIS — R928 Other abnormal and inconclusive findings on diagnostic imaging of breast: Secondary | ICD-10-CM | POA: Diagnosis not present

## 2016-11-18 NOTE — Patient Outreach (Signed)
Pt has had high claims, out of network services. Unable to determine her support system would like [facility] staff to reach out to see if our services can benefit and support her.  Eulah Pont. Myrtie Neither, MSN, [facility] Gerontological Nurse Practitioner [facility] Care Management [phone]

## 2016-11-18 NOTE — Patient Outreach (Signed)
[facility]) Care Management  11/18/2016  DAYANARA SHERRILL [birth year] [id number]  Telephone Screen  Referral Date: 11/18/2016 Referral Source: Nurse Practicioner Reason for Referral: Utilizing out of network resources Insurance: [employer]   Outreach Attempt: Successful outreach to patient for telephone screening.  HIPAA verified. [facility] services discussed with patient.  Patient did not want to complete the screening process at this time and declines [facility] services.  States "I live alone and take care of myself".  Informed patient RN Health Coach will mail out letter and brochure describing [facility] services.  Patient appreciative.  Consent: Patient declines the screening process and declines [facility] services at this time.  Plan: RN Health Coach will send successful outreach letter to patient.  RN Health Coach will notify CMA of case closure status.   [place] [phone] [email or web address]

## 2016-11-24 ENCOUNTER — Telehealth: Payer: Self-pay | Admitting: Adult Health

## 2016-11-24 NOTE — Telephone Encounter (Signed)
Spoke with patient and rescheduled appt per them having back surgery soon. Patient said she will call back and reschedule once she is able to walk.

## 2016-12-01 ENCOUNTER — Other Ambulatory Visit: Payer: PPO

## 2016-12-01 ENCOUNTER — Ambulatory Visit: Payer: PPO | Admitting: Adult Health

## 2016-12-06 DIAGNOSIS — Z91013 Allergy to seafood: Secondary | ICD-10-CM | POA: Diagnosis not present

## 2016-12-06 DIAGNOSIS — Z79899 Other long term (current) drug therapy: Secondary | ICD-10-CM | POA: Diagnosis not present

## 2016-12-06 DIAGNOSIS — G709 Myoneural disorder, unspecified: Secondary | ICD-10-CM | POA: Diagnosis not present

## 2016-12-06 DIAGNOSIS — M1990 Unspecified osteoarthritis, unspecified site: Secondary | ICD-10-CM | POA: Diagnosis not present

## 2016-12-06 DIAGNOSIS — E039 Hypothyroidism, unspecified: Secondary | ICD-10-CM | POA: Diagnosis not present

## 2016-12-06 DIAGNOSIS — Z886 Allergy status to analgesic agent status: Secondary | ICD-10-CM | POA: Diagnosis not present

## 2016-12-06 DIAGNOSIS — M5416 Radiculopathy, lumbar region: Secondary | ICD-10-CM | POA: Diagnosis not present

## 2016-12-06 DIAGNOSIS — M48061 Spinal stenosis, lumbar region without neurogenic claudication: Secondary | ICD-10-CM | POA: Diagnosis not present

## 2016-12-06 DIAGNOSIS — Z853 Personal history of malignant neoplasm of breast: Secondary | ICD-10-CM | POA: Diagnosis not present

## 2016-12-06 DIAGNOSIS — Z888 Allergy status to other drugs, medicaments and biological substances status: Secondary | ICD-10-CM | POA: Diagnosis not present

## 2016-12-06 HISTORY — PX: BACK SURGERY: SHX140

## 2016-12-18 DIAGNOSIS — Z791 Long term (current) use of non-steroidal anti-inflammatories (NSAID): Secondary | ICD-10-CM | POA: Diagnosis not present

## 2016-12-18 DIAGNOSIS — Z96643 Presence of artificial hip joint, bilateral: Secondary | ICD-10-CM | POA: Diagnosis not present

## 2016-12-18 DIAGNOSIS — M48061 Spinal stenosis, lumbar region without neurogenic claudication: Secondary | ICD-10-CM | POA: Diagnosis not present

## 2016-12-18 DIAGNOSIS — Z4781 Encounter for orthopedic aftercare following surgical amputation: Secondary | ICD-10-CM | POA: Diagnosis not present

## 2016-12-18 DIAGNOSIS — Z9181 History of falling: Secondary | ICD-10-CM | POA: Diagnosis not present

## 2016-12-18 DIAGNOSIS — Z7952 Long term (current) use of systemic steroids: Secondary | ICD-10-CM | POA: Diagnosis not present

## 2016-12-18 DIAGNOSIS — G629 Polyneuropathy, unspecified: Secondary | ICD-10-CM | POA: Diagnosis not present

## 2016-12-18 DIAGNOSIS — Z79891 Long term (current) use of opiate analgesic: Secondary | ICD-10-CM | POA: Diagnosis not present

## 2016-12-22 DIAGNOSIS — Z9889 Other specified postprocedural states: Secondary | ICD-10-CM | POA: Diagnosis not present

## 2016-12-29 DIAGNOSIS — Z7952 Long term (current) use of systemic steroids: Secondary | ICD-10-CM | POA: Diagnosis not present

## 2016-12-29 DIAGNOSIS — R32 Unspecified urinary incontinence: Secondary | ICD-10-CM | POA: Diagnosis not present

## 2016-12-29 DIAGNOSIS — Z9181 History of falling: Secondary | ICD-10-CM | POA: Diagnosis not present

## 2016-12-29 DIAGNOSIS — G629 Polyneuropathy, unspecified: Secondary | ICD-10-CM | POA: Diagnosis not present

## 2016-12-29 DIAGNOSIS — Z981 Arthrodesis status: Secondary | ICD-10-CM | POA: Diagnosis not present

## 2016-12-29 DIAGNOSIS — Z4789 Encounter for other orthopedic aftercare: Secondary | ICD-10-CM | POA: Diagnosis not present

## 2016-12-29 DIAGNOSIS — M48061 Spinal stenosis, lumbar region without neurogenic claudication: Secondary | ICD-10-CM | POA: Diagnosis not present

## 2016-12-29 DIAGNOSIS — Z96643 Presence of artificial hip joint, bilateral: Secondary | ICD-10-CM | POA: Diagnosis not present

## 2017-01-20 DIAGNOSIS — Z9889 Other specified postprocedural states: Secondary | ICD-10-CM | POA: Diagnosis not present

## 2017-02-01 DIAGNOSIS — Z7952 Long term (current) use of systemic steroids: Secondary | ICD-10-CM | POA: Diagnosis not present

## 2017-02-01 DIAGNOSIS — Z79899 Other long term (current) drug therapy: Secondary | ICD-10-CM | POA: Diagnosis not present

## 2017-02-01 DIAGNOSIS — R29898 Other symptoms and signs involving the musculoskeletal system: Secondary | ICD-10-CM | POA: Diagnosis not present

## 2017-02-01 DIAGNOSIS — M48062 Spinal stenosis, lumbar region with neurogenic claudication: Secondary | ICD-10-CM | POA: Diagnosis not present

## 2017-02-24 DIAGNOSIS — Z9889 Other specified postprocedural states: Secondary | ICD-10-CM | POA: Diagnosis not present

## 2017-03-02 DIAGNOSIS — M48061 Spinal stenosis, lumbar region without neurogenic claudication: Secondary | ICD-10-CM | POA: Diagnosis not present

## 2017-03-02 DIAGNOSIS — R32 Unspecified urinary incontinence: Secondary | ICD-10-CM | POA: Diagnosis not present

## 2017-03-02 DIAGNOSIS — Z9181 History of falling: Secondary | ICD-10-CM | POA: Diagnosis not present

## 2017-03-02 DIAGNOSIS — Z96643 Presence of artificial hip joint, bilateral: Secondary | ICD-10-CM | POA: Diagnosis not present

## 2017-03-02 DIAGNOSIS — G629 Polyneuropathy, unspecified: Secondary | ICD-10-CM | POA: Diagnosis not present

## 2017-03-02 DIAGNOSIS — Z4789 Encounter for other orthopedic aftercare: Secondary | ICD-10-CM | POA: Diagnosis not present

## 2017-03-02 DIAGNOSIS — Z7952 Long term (current) use of systemic steroids: Secondary | ICD-10-CM | POA: Diagnosis not present

## 2017-03-02 DIAGNOSIS — Z981 Arthrodesis status: Secondary | ICD-10-CM | POA: Diagnosis not present

## 2017-03-24 ENCOUNTER — Telehealth: Payer: Self-pay | Admitting: Oncology

## 2017-03-24 NOTE — Telephone Encounter (Signed)
patient called to reschedule missed appointment

## 2017-03-27 NOTE — Progress Notes (Signed)
ID: Rachael [name]   DOB: [birth year]  MR#: [id number]  FWY#:[id number]  PCP: [name], MD GYN: Dian Queen, MD SU: Autumn Messing, MD OTHER MD:  Minus Breeding, MD;  Nena Polio, MD; Thea Silversmith, MD;  Lucio Edward, MD, Sabino Niemann MD  CHIEF COMPLAINT:  Right Breast Cancer  CURRENT TREATMENT: Observation  BREAST CANCER HISTORY: From the original intake note:  [name] had routine screening mammography at [facility] 05/06/2011, showing an area of focal asymmetry in the lower inner quadrant of the right breast. Additional views 05/10/2011 confirmed a 9 mm area of irregularity without associated microcalcifications. Ultrasound of the right breast confirmed a 9 mm hypoechoic mass with posterior acoustic shadowing. Biopsy of this mass was performed the next day, and showed ([id number]) and invasive lobular carcinoma, E-cadherin negative, which was estrogen receptor positive at 97%, and progesterone receptor positive at 81%. There was no HER-2 amplification, and MIB-1 was 12%.  On may 07/31/2011 him of the patient underwent breast MRI, which showed a 1.5 cm mildly irregular enhancing mass in the upper inner quadrant of the right breast. There were no other masses of concern and no abnormal appearing lymph nodes. Accordingly, on 06/16/2011 the patient underwent right lumpectomy and sentinel lymph node sampling under Dr. Star Age, the final pathology (SZ [id number]) showing an invasive lobular carcinoma, grade 1, measuring 1.5 cm, with both sentinel lymph nodes negative.   The patient's subsequent history is as detailed below.  INTERVAL HISTORY: [name] returns today for follow up of her estrogen receptor positive breast cancer.  She continues under observation alone, she is doing well overall.   Since her last visit to the office, she underwent a diagnostic mammogram at [facility] on 11/18/2016 with results showing: Breast density category B. No mammographic evidence of malignancy.   She also  had a DEXA scan completed at [facility] on 11/19/2016 which was normal   On 12/06/2016 she underwent surgery for spinal stenosis at [facility], followed by physical therapy.  She is recovering slowly but steadily.  She is hoping to be able to get back to dancing by the end of the year     REVIEW OF SYSTEMS: [name] reports that for exercise, she would like to start with water aerobics at [facility] and she recently completed physical therapy. She recently had back surgery for spinal stenosis in November 2018 performed at [facility] by Dr. Anastasia Fiedler. She reports that her legs are recuperating from her overuse of prednisone. She has had Trigenics completed at a Chiropractor that helped her to start walking and she was able to walk the [employer] store twice. She would love to have this procedure again, but it is costly at $200 a session. She denies unusual headaches, visual changes, nausea, vomiting, or dizziness. There has been no unusual cough, phlegm production, or pleurisy. This been no change in bowel or bladder habits. She denies unexplained fatigue or unexplained weight loss, bleeding, rash, or fever. A detailed review of systems was otherwise stable.    PAST MEDICAL HISTORY: Past Medical History:  Diagnosis Date  . Breast cancer (HCC)    R  . CAROTID STENOSIS   . Chronic venous insufficiency   . CONTACT DERMATITIS   . DUPUYTREN'S CONTRACTURE   . Edema    pt  states had edema of feet and legs has been on lasix per dr. to help  . GLUCOSE INTOLERANCE   . HLD (hyperlipidemia)   . Hypothyroidism    Dr. Ala Dach  in [place]   . MENOPAUSAL SYNDROME   . Obesity   . Osteoarthritis    bilat hips, severe  . PVD (peripheral vascular disease) ([place])    mild carotid 11/05  . VITAMIN D DEFICIENCY     PAST SURGICAL HISTORY: Past Surgical History:  Procedure Laterality Date  . 2D echo  11/29/03   normal LV size. normal EF   . [place]   lumbarectomy  . BREAST BIOPSY  05/11/11   right  breast  . BREAST LUMPECTOMY     right breast  . CHOLECYSTECTOMY    . FINGER SURGERY    . HERNIA REPAIR     incisional hernia  . INGUINAL HERNIA REPAIR    . JOINT REPLACEMENT Bilateral    Dr. Maureen Ralphs  . normal caronary arteries     per pt. 2003  . right rotator cuff surgery    . TONSILLECTOMY    . TOTAL HIP ARTHROPLASTY  05/2008 and 10/2008   B hip - alusio    FAMILY HISTORY Family History  Problem Relation Age of Onset  . Uterine cancer Mother 87  . Cancer Mother   . Breast cancer Daughter 3       BRCA negative (no BART)  . Cancer Daughter   . Pancreatic cancer Maternal Grandfather        diagnosed in his 57s  . Prostate cancer Maternal Uncle        diagnosed in his 35s  . Colon cancer Cousin   . Prostate cancer Paternal Uncle        diagnosed in his 64s  . Clotting disorder Maternal Grandmother   . Leukemia Cousin   . Heart disease Father        before age 45  . Varicose Veins Father   . Heart disease Brother        before age 22  . Coronary artery disease Unknown        family hx - female 1st degree relative <60  . Hypothyroidism Unknown        aunt   . Uterine cancer Cousin   . Prostate cancer Cousin   . Cancer Sister   . Anesthesia problems Neg Hx    the patient's father died at the age of 70 from heart disease. The patient's mother died in 30 at the age of 54. She had uterine cancer in her 31s. There is a significant cancer family on her side, to be detailed when the patient meets with her genetics counselor. The patient herself has no sisters, does have 3 brothers. One of the patient's daughters, [name], was diagnosed with breast cancer at the age of 28. She was tested for the BRCA gene and was negative. There is no other breast or ovarian cancer in the immediate family to her knowledge.  GYNECOLOGIC HISTORY: Menarche age 79, menopause age 77. The patient tried Prempro briefly but did not like it. She is GX P5, first live birth age 22.  SOCIAL HISTORY:    (Updated December 2014) [name] worked in Press photographer, currently is developing a [employer]. She is divorced and lives by herself. 3 of her 5 children live in town ([place], [place], and [place]; [name] is also followed here w a history of breast cancer) and 2 live in [place] and [place]). She has 7 grandchildren. She attends the [employer].   ADVANCED DIRECTIVES: Not in place  HEALTH MAINTENANCE:  (Updated December 2014)  Social History   Tobacco Use  . Smoking status: Never Smoker  . Smokeless tobacco: Never Used  Substance Use Topics  . Alcohol use: No    Alcohol/week: 0.0 oz  . Drug use: No     Colonoscopy: 2009/[facility]  PAP: Sept 2014/[facility]  [facility], Normal  Lipid panel: Sept 2014/ [facility]    Allergies  Allergen Reactions  . Shellfish Allergy Anaphylaxis and Hives    Hives all over the body.  . Percocet [Oxycodone-Acetaminophen] Hives  . Iodinated Diagnostic Agents     PT IS NOT AWARE OF IODINE ALLERGY, PREMEDICATED FOR PRIOR PREMEDS ONLY//A.C.  . Iodine Hives  . Prednisone     nausea  . Ultram [Tramadol Hcl]     Current Outpatient Medications  Medication Sig Dispense Refill  . Cholecalciferol 5000 units TABS Take 1 tablet by mouth daily.    . Cyanocobalamin (B-12 PO) Take 4,500 mcg by mouth daily.    Marland Kitchen levothyroxine (SYNTHROID, LEVOTHROID) 125 MCG tablet Take 125 mcg by mouth daily before breakfast.    . meloxicam (MOBIC) 15 MG tablet Take 15 mg by mouth daily.    . PredniSONE 2 MG TBEC Take 2 mg by mouth once.    . Soft Lens Products (REWETTING DROPS) SOLN Apply 1 drop to eye as needed (Dry eyes).    . traMADol (ULTRAM) 50 MG tablet Take 1 tablet (50 mg total) by mouth every 6 (six) hours as needed for moderate pain. 120 tablet 2   No current facility-administered medications for this visit.     OBJECTIVE: Middle-aged white woman using a seat walker  Vitals:   03/28/17 1516  BP: (!) 146/52  Pulse: 61   Resp: 18  Temp: 98.6 F (37 C)  SpO2: 94%     Body mass index is 26.26 kg/m.    ECOG FS: 2 Filed Weights   Sclerae unicteric  No cervical or supraclavicular adenopathy Lungs no rales or rhonchi Heart regular rate and rhythm Abd soft, obese, nontender, positive bowel sounds MSK no focal spinal tenderness, no upper extremity lymphedema Neuro: nonfocal, well oriented, appropriate affect Breasts: The right breast is undergone lumpectomy with no evidence of local recurrence the left breast is benign.  Both axillae are benign.  LAB RESULTS: Lab Results  Component Value Date   WBC 9.5 03/28/2017   NEUTROABS 6.7 (H) 03/28/2017   HGB 12.2 03/28/2017   HCT 37.3 03/28/2017   MCV 85.9 03/28/2017   PLT 422 (H) 03/28/2017      Chemistry      Component Value Date/Time   NA 141 02/27/2016   NA 143 10/15/2015 0943   K 4.0 02/27/2016   K 4.1 10/15/2015 0943   CL 107 03/19/2015 0340   CL 104 06/20/2012 1421   CO2 28 10/15/2015 0943   BUN 18 02/27/2016   BUN 21.4 10/15/2015 0943   CREATININE 0.8 02/27/2016   CREATININE 0.8 10/15/2015 0943   GLU 92 02/27/2016      Component Value Date/Time   CALCIUM 10.3 10/15/2015 0943   ALKPHOS 58 02/25/2016   ALKPHOS 77 10/15/2015 0943   AST 16 02/25/2016   AST 16 10/15/2015 0943   ALT 10 02/25/2016   ALT 12 10/15/2015 0943   BILITOT 1.44 (H) 10/15/2015 0943       STUDIES: Since her last visit to the office, she underwent a diagnostic mammogram at [facility] on 11/18/2016 with results showing: Breast density category B. No mammographic evidence of  malignancy.   She also had a DEXA scan completed at [facility] on 11/19/2016 which was normal  ASSESSMENT: 84 y.o. [place] woman   (1)  s/p Right lumpectomy and sentinel lymph node sampling 06/16/2011 for a T1c N0, stage IA invasive lobular breast cancer, grade 1, strongly estrogen and progesterone positive, with no HER-2 amplification, and an Mib-1 of 12%  (2) the patient  met with radiation oncology 07/14/2011, but opted against adjuvant radiation  (3) started anastrozole July 2013, briefly discontinued because of side effects but resumed October 2013. Stopped October 2016.  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 other genes tested were normal and included APC, ATM, BARD1, BMPR1A, BRCA1, BRCA2, BRIP1, CDH1, CHEK2, EPCAM, MLH1, MRE11A, MSH6, MUTYH, NBN, PALB2, PMS2, PTEN, RAD50, RAD51C, SMAD4, STK11 and TP53  PLAN:  Charletta is now just about 5 years out from definitive surgery for her breast cancer with no evidence of disease recurrence.  This is very favorable.  At this point I feel comfortable releasing her to her primary care physician.  All she will need in terms of breast cancer follow-up is yearly mammography with tomography and a yearly physician breast exam  I gave her information in our survivorship program.  She will consider whether she wishes to participate  I will be glad to see Brisa at any point in the future if and when she has a problem, but otherwise we are making no routine appointment for her here  Chauncey Cruel, MD  03/28/17 3:28 PM Medical Oncology and Hematology [facility] [place], [place] [address] Tel. [phone]    Fax. [phone]    This document serves as a record of services personally performed by Lurline Del, MD. It was created on his behalf by Steva Colder, a trained medical scribe. The creation of this record is based on the scribe's personal observations and the provider's statements to them.   I have reviewed the above documentation for accuracy and completeness, and I agree with the above.

## 2017-03-28 ENCOUNTER — Encounter: Payer: Self-pay | Admitting: Oncology

## 2017-03-28 ENCOUNTER — Inpatient Hospital Stay: Payer: PPO | Attending: Oncology | Admitting: Oncology

## 2017-03-28 ENCOUNTER — Inpatient Hospital Stay: Payer: PPO

## 2017-03-28 DIAGNOSIS — C50311 Malignant neoplasm of lower-inner quadrant of right female breast: Secondary | ICD-10-CM | POA: Insufficient documentation

## 2017-03-28 DIAGNOSIS — Z853 Personal history of malignant neoplasm of breast: Secondary | ICD-10-CM | POA: Diagnosis not present

## 2017-03-28 DIAGNOSIS — Z170 Estrogen receptor positive status [ER+]: Secondary | ICD-10-CM | POA: Insufficient documentation

## 2017-03-28 DIAGNOSIS — C50911 Malignant neoplasm of unspecified site of right female breast: Secondary | ICD-10-CM

## 2017-03-28 LAB — COMPREHENSIVE METABOLIC PANEL
ALBUMIN: 3.7 g/dL (ref 3.5–5.0)
ALK PHOS: 78 U/L (ref 40–150)
ALT: 8 U/L (ref 0–55)
ANION GAP: 11 (ref 3–11)
AST: 16 U/L (ref 5–34)
BILIRUBIN TOTAL: 0.7 mg/dL (ref 0.2–1.2)
BUN: 22 mg/dL (ref 7–26)
CALCIUM: 10.4 mg/dL (ref 8.4–10.4)
CO2: 26 mmol/L (ref 22–29)
Chloride: 106 mmol/L (ref 98–109)
Creatinine, Ser: 0.84 mg/dL (ref 0.60–1.10)
GFR calc Af Amer: >60 mL/min (ref >60)
GFR calc non Af Amer: >60 mL/min (ref >60)
GLUCOSE: 91 mg/dL (ref 70–140)
Potassium: 4 mmol/L (ref 3.5–5.1)
SODIUM: 143 mmol/L (ref 136–145)
TOTAL PROTEIN: 7.1 g/dL (ref 6.4–8.3)

## 2017-03-28 LAB — CBC WITH DIFFERENTIAL/PLATELET
BASOS ABS: 0 10*3/uL (ref 0.0–0.1)
BASOS PCT: 0 %
EOS ABS: 0.1 10*3/uL (ref 0.0–0.5)
Eosinophils Relative: 1 %
HEMATOCRIT: 37.3 % (ref 34.8–46.6)
HEMOGLOBIN: 12.2 g/dL (ref 11.6–15.9)
Lymphocytes Relative: 18 %
Lymphs Abs: 1.7 10*3/uL (ref 0.9–3.3)
MCH: 28 pg (ref 25.1–34.0)
MCHC: 32.6 g/dL (ref 31.5–36.0)
MCV: 85.9 fL (ref 79.5–101.0)
Monocytes Absolute: 0.9 10*3/uL (ref 0.1–0.9)
Monocytes Relative: 10 %
NEUTROS ABS: 6.7 10*3/uL — AB (ref 1.5–6.5)
NEUTROS PCT: 71 %
Platelets: 422 10*3/uL — ABNORMAL HIGH (ref 145–400)
RBC: 4.34 MIL/uL (ref 3.70–5.45)
RDW: 16.4 % — ABNORMAL HIGH (ref 11.2–14.5)
WBC: 9.5 10*3/uL (ref 3.9–10.3)

## 2017-03-29 ENCOUNTER — Telehealth: Payer: Self-pay | Admitting: Oncology

## 2017-03-29 NOTE — Telephone Encounter (Signed)
Per 3/18 no los °

## 2017-04-07 ENCOUNTER — Ambulatory Visit (INDEPENDENT_AMBULATORY_CARE_PROVIDER_SITE_OTHER): Payer: PPO | Admitting: Adult Health

## 2017-04-07 ENCOUNTER — Encounter: Payer: Self-pay | Admitting: Adult Health

## 2017-04-07 VITALS — BP 136/60 | HR 70 | Temp 98.3°F

## 2017-04-07 DIAGNOSIS — S0502XA Injury of conjunctiva and corneal abrasion without foreign body, left eye, initial encounter: Secondary | ICD-10-CM

## 2017-04-07 MED ORDER — ERYTHROMYCIN 5 MG/GM OP OINT: TOPICAL_OINTMENT | Freq: Three times a day (TID) | OPHTHALMIC | 0 refills | Status: DC

## 2017-04-07 NOTE — Progress Notes (Signed)
Subjective:    Patient ID: Rachael [name], female    DOB: [birth year], 84 y.o.   MRN: [id number]  HPI 84 year old female who  has a past medical history of Breast cancer ([place]), CAROTID STENOSIS, Chronic venous insufficiency, CONTACT DERMATITIS, DUPUYTREN'S CONTRACTURE, Edema, GLUCOSE INTOLERANCE, HLD (hyperlipidemia), Hypothyroidism, MENOPAUSAL SYNDROME, Obesity, Osteoarthritis, PVD (peripheral vascular disease) ([place]), and VITAMIN D DEFICIENCY.  She presents to the office today with the acute complaint of redness, burning and watering of left eye. Started yesterday afternoon after she put in a new contact. She reports her symptoms started about one hour after placing contact in eye. She is concerned that she may be allergic to the contact.   Denies any trauma to the eye. Her eye lids have not been matted shut and has not noticed any purulent drainage from the left eye  Review of Systems See HPI   Past Medical History:  Diagnosis Date  . Breast cancer (HCC)    R  . CAROTID STENOSIS   . Chronic venous insufficiency   . CONTACT DERMATITIS   . DUPUYTREN'S CONTRACTURE   . Edema    pt  states had edema of feet and legs has been on lasix per dr. to help  . GLUCOSE INTOLERANCE   . HLD (hyperlipidemia)   . Hypothyroidism    Dr. Ala Dach in [place]   . MENOPAUSAL SYNDROME   . Obesity   . Osteoarthritis    bilat hips, severe  . PVD (peripheral vascular disease) ([place])    mild carotid 11/05  . VITAMIN D DEFICIENCY     Social History   Socioeconomic History  . Marital status: Divorced    Spouse name: Not on file  . Number of children: Not on file  . Years of education: Not on file  . Highest education level: Not on file  Occupational History  . Not on file  Social Needs  . Financial resource strain: Not on file  . Food insecurity:    Worry: Not on file    Inability: Not on file  . Transportation needs:    Medical: Not on file    Non-medical: Not on file  Tobacco Use    . Smoking status: Never Smoker  . Smokeless tobacco: Never Used  Substance and Sexual Activity  . Alcohol use: No    Alcohol/week: 0.0 oz  . Drug use: No  . Sexual activity: Not Currently  Lifestyle  . Physical activity:    Days per week: Not on file    Minutes per session: Not on file  . Stress: Not on file  Relationships  . Social connections:    Talks on phone: Not on file    Gets together: Not on file    Attends religious service: Not on file    Active member of club or organization: Not on file    Attends meetings of clubs or organizations: Not on file    Relationship status: Not on file  . Intimate partner violence:    Fear of current or ex partner: Not on file    Emotionally abused: Not on file    Physically abused: Not on file    Forced sexual activity: Not on file  Other Topics Concern  . Not on file  Social History Narrative   Divorced - helps care for elderly mother.   Prior work-sales. Water aerobics 6/week- 3d water, 3d cardio.     Past Surgical History:  Procedure Laterality Date  .  2D echo  11/29/03   normal LV size. normal EF   . [place]   lumbarectomy  . BREAST BIOPSY  05/11/11   right breast  . BREAST LUMPECTOMY     right breast  . CHOLECYSTECTOMY    . FINGER SURGERY    . HERNIA REPAIR     incisional hernia  . INGUINAL HERNIA REPAIR    . JOINT REPLACEMENT Bilateral    Dr. Maureen Ralphs  . normal caronary arteries     per pt. 2003  . right rotator cuff surgery    . TONSILLECTOMY    . TOTAL HIP ARTHROPLASTY  05/2008 and 10/2008   B hip - alusio    Family History  Problem Relation Age of Onset  . Uterine cancer Mother 66  . Cancer Mother   . Breast cancer Daughter 79       BRCA negative (no BART)  . Cancer Daughter   . Pancreatic cancer Maternal Grandfather        diagnosed in his 77s  . Prostate cancer Maternal Uncle        diagnosed in his 58s  . Colon cancer Cousin   . Prostate cancer Paternal Uncle        diagnosed in his  42s  . Clotting disorder Maternal Grandmother   . Leukemia Cousin   . Heart disease Father        before age 52  . Varicose Veins Father   . Heart disease Brother        before age 20  . Coronary artery disease Unknown        family hx - female 1st degree relative <60  . Hypothyroidism Unknown        aunt   . Uterine cancer Cousin   . Prostate cancer Cousin   . Cancer Sister   . Anesthesia problems Neg Hx     Allergies  Allergen Reactions  . Shellfish Allergy Anaphylaxis and Hives    Hives all over the body.  . Percocet [Oxycodone-Acetaminophen] Hives  . Iodinated Diagnostic Agents     PT IS NOT AWARE OF IODINE ALLERGY, PREMEDICATED FOR PRIOR PREMEDS ONLY//A.C.  . Iodine Hives  . Prednisone     nausea  . Ultram [Tramadol Hcl]     Current Outpatient Medications on File Prior to Visit  Medication Sig Dispense Refill  . Cholecalciferol 5000 units TABS Take 1 tablet by mouth daily.    . Cyanocobalamin (B-12 PO) Take 4,500 mcg by mouth daily.    Marland Kitchen levothyroxine (SYNTHROID, LEVOTHROID) 125 MCG tablet Take 125 mcg by mouth daily before breakfast.    . PredniSONE 2 MG TBEC Take 2 mg by mouth once.    . Soft Lens Products (REWETTING DROPS) SOLN Apply 1 drop to eye as needed (Dry eyes).    . meloxicam (MOBIC) 15 MG tablet Take 15 mg by mouth daily.    . traMADol (ULTRAM) 50 MG tablet Take 1 tablet (50 mg total) by mouth every 6 (six) hours as needed for moderate pain. (Patient not taking: Reported on 04/07/2017) 120 tablet 2   No current facility-administered medications on file prior to visit.     BP 136/60 (BP Location: Left Arm)   Pulse 70   Temp 98.3 F (36.8 C) (Oral)   SpO2 98%       Objective:   Physical Exam  Constitutional: She is oriented to person, place, and time. She appears well-developed and well-nourished. No distress.  Eyes: Pupils are equal, round, and reactive to light. EOM are normal. Right eye exhibits no discharge. Left eye exhibits discharge. Right  conjunctiva is not injected. Right conjunctiva has no hemorrhage. Left conjunctiva is injected. Left conjunctiva has no hemorrhage. No scleral icterus.  Slit lamp exam:      The right eye shows no fluorescein uptake.       The left eye shows corneal abrasion and fluorescein uptake. The left eye shows no corneal ulcer.    Neurological: She is alert and oriented to person, place, and time.  Skin: Skin is warm and dry. No rash noted. She is not diaphoretic. No erythema.  Psychiatric: She has a normal mood and affect. Her behavior is normal. Judgment and thought content normal.  Nursing note and vitals reviewed.     Assessment & Plan:  1. Abrasion of left cornea, initial encounter - erythromycin (ROMYCIN) ophthalmic ointment; Place into the left eye 3 (three) times daily for 7 days.  Dispense: 1 g; Refill: 0 - Follow up if no improvement   Dorothyann Peng, NP

## 2017-04-13 ENCOUNTER — Telehealth: Payer: Self-pay

## 2017-04-13 ENCOUNTER — Ambulatory Visit (INDEPENDENT_AMBULATORY_CARE_PROVIDER_SITE_OTHER): Payer: PPO

## 2017-04-13 VITALS — BP 120/60 | HR 78 | Ht 68.0 in | Wt 196.0 lb

## 2017-04-13 DIAGNOSIS — Z0000 Encounter for general adult medical examination without abnormal findings: Secondary | ICD-10-CM | POA: Diagnosis not present

## 2017-04-13 MED ORDER — TOBRAMYCIN-DEXAMETHASONE 0.3-0.1 % OP SUSP: OPHTHALMIC | 0 refills | Status: DC

## 2017-04-13 NOTE — Telephone Encounter (Signed)
prescription has been sent. Called pt and left a detailed VM following Dr. Barbie Banner orders.

## 2017-04-13 NOTE — Telephone Encounter (Signed)
Pt was seen by [facility] 3/28 for corneal abrasion on the left eye. States her erythromycin ends tomorrow and feels like her eye is not better.  Just wondered if he wanted to reorder meds.   Sent to PCP to advise

## 2017-04-13 NOTE — Telephone Encounter (Signed)
Tell her to stop the erythromycin ointment. Instead try Tobradex eye drops, to apply 2 drops to the eye QID. Call in 10 ml with no rf

## 2017-04-13 NOTE — Progress Notes (Addendum)
Subjective:   Rachael [name] is a 84 y.o. female who presents for Medicare Annual (Subsequent) preventive examination.  Reports health as  Hx breast cancer but now released from oncology  Last OV Dr. Sarajane Jews 11/2016 Has surgery in Nov for spinal stenosis and Dr. Leane Platt dx  Lives alone dtr lives near 4 boys check on her  6 steps in garage Bathing independent  Dress yourself Groceries; does her shakes and gets her home grown vegetables    Diet Juice plus  This is a capsule, vegetable and berry  Omega;  Eats organic chicken and salmon Mostly fruits and vegetables No sugar  Protein shake with add'l fruits and vegetables Lost 64 lbs; even on thyroid medicine   BMI 29.8 but losing weight since prednisone is being tapered  Was on thyroid med since 84 yo    Exercise On walker today Surgery in November at [facility] Had home therapy  Stem cell chiro "triogenics"  States she could walk after this procedure   There are no preventive care reminders to display for this patient.   Declines shingrix at this time   Cardiac Risk Factors include: advanced age (>73mn, >>41women);obesity (BMI >30kg/m2)     Objective:     Vitals: BP 120/60   Pulse 78   Ht 5' 8"  (1.727 m)   Wt 196 lb (88.9 kg)   SpO2 91%   BMI 29.80 kg/m   Body mass index is 29.8 kg/m.  Advanced Directives 04/13/2017 08/27/2016 04/21/2016 02/06/2016 10/22/2015 05/20/2015 04/15/2015  Does Patient Have a Medical Advance Directive? Yes Yes Yes Yes No Yes No  Type of Advance Directive - [place] - - -  Does patient want to make changes to medical advance directive? - - - No - Patient declined - - -  Copy of [place]? - - Yes Yes - No - copy requested -  Would patient like information on creating a medical advance directive? - - - - - - -    Tobacco Social History   Tobacco Use    Smoking Status Never Smoker  Smokeless Tobacco Never Used     Counseling given: Yes   Clinical Intake:   Past Medical History:  Diagnosis Date  . Breast cancer (HCC)    R  . CAROTID STENOSIS   . Chronic venous insufficiency   . CONTACT DERMATITIS   . DUPUYTREN'S CONTRACTURE   . Edema    pt  states had edema of feet and legs has been on lasix per dr. to help  . GLUCOSE INTOLERANCE   . HLD (hyperlipidemia)   . [place]    [place]  . MENOPAUSAL SYNDROME   . Obesity   . Osteoarthritis    bilat hips, severe  . PVD (peripheral vascular disease) ([place]    mild carotid 11/05  . VITAMIN D DEFICIENCY    Past Surgical History:  Procedure Laterality Date  . 2D echo  11/29/03   normal LV size. normal EF   [place]  lumb[place]  . BREAST BIOPSY  05/11/11   right breast  . BREAST LUMPECTOMY     right breast  . CHOLECYSTECTOMY    . FINGER SURGERY    . HERNIA REPAIR     incisional hernia  . INGUINAL HERNIA REPAIR    . JOINT REPLACEMENT Bilateral  Dr. Maureen Ralphs  . normal caronary arteries     per pt. 2003  . right rotator cuff surgery    . TONSILLECTOMY    . TOTAL HIP ARTHROPLASTY  05/2008 and 10/2008   B hip - alusio   Family History  Problem Relation Age of Onset  . Uterine cancer Mother 70  . Cancer Mother   . Breast cancer Daughter 60       BRCA negative (no BART)  . Cancer Daughter   . Pancreatic cancer Maternal Grandfather        diagnosed in his 36s  . Prostate cancer Maternal Uncle        diagnosed in his 56s  . Colon cancer Cousin   . Prostate cancer Paternal Uncle        diagnosed in his 91s  . Clotting disorder Maternal Grandmother   . Leukemia Cousin   . Heart disease Father        before age 53  . Varicose Veins Father   . Heart disease Brother        before age 18  . Coronary artery disease Unknown        family hx - female 1st degree relative <60  . Hypothyroidism Unknown        aunt   . Uterine  cancer Cousin   . Prostate cancer Cousin   . Cancer Sister   . Anesthesia problems Neg Hx    Social History   Socioeconomic History  . Marital status: Divorced    Spouse name: Not on file  . Number of children: Not on file  . Years of education: Not on file  . Highest education level: Not on file  Occupational History  . Not on file  Social Needs  . Financial resource strain: Not on file  . Food insecurity:    Worry: Not on file    Inability: Not on file  . Transportation needs:    Medical: Not on file    Non-medical: Not on file  Tobacco Use  . Smoking status: Never Smoker  . Smokeless tobacco: Never Used  Substance and Sexual Activity  . Alcohol use: No    Alcohol/week: 0.0 oz  . Drug use: No  . Sexual activity: Not Currently  Lifestyle  . Physical activity:    Days per week: Not on file    Minutes per session: Not on file  . Stress: Not on file  Relationships  . Social connections:    Talks on phone: Not on file    Gets together: Not on file    Attends religious service: Not on file    Active member of club or organization: Not on file    Attends meetings of clubs or organizations: Not on file    Relationship status: Not on file  Other Topics Concern  . Not on file  Social History Narrative   Divorced - helps care for elderly mother.   Prior work-sales. Water aerobics 6/week- 3d water, 3d cardio.     Outpatient Encounter Medications as of 04/13/2017  Medication Sig  . Cholecalciferol 5000 units TABS Take 1 tablet by mouth daily.  . Cyanocobalamin (B-12 PO) Take 4,500 mcg by mouth daily.  Marland Kitchen levothyroxine (SYNTHROID, LEVOTHROID) 125 MCG tablet Take 125 mcg by mouth daily before breakfast.  . PredniSONE 2 MG TBEC Take 2 mg by mouth once.  . [DISCONTINUED] erythromycin [facility]) ophthalmic ointment Place into the left eye 3 (three) times daily for 7 days.  Marland Kitchen  meloxicam (MOBIC) 15 MG tablet Take 15 mg by mouth daily.  . Soft Lens Products (REWETTING DROPS) SOLN  Apply 1 drop to eye as needed (Dry eyes).  . traMADol (ULTRAM) 50 MG tablet Take 1 tablet (50 mg total) by mouth every 6 (six) hours as needed for moderate pain. (Patient not taking: Reported on 04/07/2017)   No facility-administered encounter medications on file as of 04/13/2017.     Activities of Daily Living In your present state of health, do you have any difficulty performing the following activities: 04/13/2017  Hearing? N  Vision? N  Difficulty concentrating or making decisions? N  Walking or climbing stairs? Y  Comment for now   Dressing or bathing? N  Doing errands, shopping? N  Preparing Food and eating ? N  Using the Toilet? N  In the past six months, have you accidently leaked urine? N  Do you have problems with loss of bowel control? N  Managing your Medications? N  Managing your Finances? N  Housekeeping or managing your Housekeeping? N  Some recent data might be hidden    Patient Care Team: [name], MD as PCP - General (Family Medicine) Gaynelle Arabian, MD as Consulting Physician (Orthopedic Surgery) Armandina Gemma, MD as Consulting Physician (General Surgery) Dian Queen, MD (Obstetrics and Gynecology) Jovita Kussmaul, MD (General Surgery) Magrinat, Virgie Dad, MD (Hematology and Oncology) Valinda Party, MD as Referring Physician (Rheumatology)    Assessment:   This is a routine wellness examination for Tayloranne.  Exercise Activities and Dietary recommendations Current Exercise Habits: Structured exercise class(recooperating from surgery )  Goals    . Exercise 150 min/wk Moderate Activity     To be able to walk again Ca next month       Fall Risk Fall Risk  04/13/2017 05/20/2015 07/08/2014 10/04/2012  Falls in the past year? No No No No  Risk for fall due to : - Impaired balance/gait - -    Depression Screen PHQ 2/9 Scores 04/13/2017 05/20/2015 07/08/2014 10/04/2012  PHQ - 2 Score 0 0 0 0     Cognitive Function MMSE - Mini Mental State Exam 04/13/2017    Not completed: (No Data)     Ad8 score reviewed for issues:  Issues making decisions:  Less interest in hobbies / activities:  Repeats questions, stories (family complaining):  Trouble using ordinary gadgets (microwave, computer, phone):  Forgets the month or year:   Mismanaging finances:   Remembering appts:  Daily problems with thinking and/or memory: Ad8 score is=0     Immunization History  Administered Date(s) Administered  . PPD Test 03/20/2015  . Td 02/11/1988, 11/14/2007, 02/22/2014     Screening Tests Health Maintenance  Topic Date Due  . INFLUENZA VACCINE  01/10/2098 (Originally 08/11/2017)  . PNA vac Low Risk Adult (1 of 2 - PCV13) 01/10/2098 (Originally 12/10/2000)  . TETANUS/TDAP  02/23/2024  . DEXA SCAN  Completed         Plan:      PCP Notes   Health Maintenance Educated regarding the shingrix Mammogram and Bone density completed  Uses Juice plus supplement and does not take vaccines  Did educated regarding the shingrix  Abnormal Screens  no  Referrals  None  Patient concerns; Was in to see Talbert Forest 3/28; " Abrasion of left cornea"  3 times a day for 7 days of antibiotic ointment Dr. Sarajane Jews ordered another antibiotic and the patient was told to pick this up  Nurse Concerns; Educated on Kegel exercise as she wears a pad but does not feel the need to discuss with Dr. Sarajane Jews at this time   Overall, doing much better States she is walking much better and only on 3 mg of prednisone and going to 2 mg soon. Recovery in process from back surgery in Nov. Mood bright and hopeful of full recovery   Next PCP apt TBS        I have personally reviewed and noted the following in the patient's chart:   . Medical and social history . Use of alcohol, tobacco or illicit drugs  . Current medications and supplements . Functional ability and status . Nutritional status . Physical activity . Advanced directives . List of other  physicians . Hospitalizations, surgeries, and ER visits in previous 12 months . Vitals . Screenings to include cognitive, depression, and falls . Referrals and appointments  In addition, I have reviewed and discussed with patient certain preventive protocols, quality metrics, and best practice recommendations. A written personalized care plan for preventive services as well as general preventive health recommendations were provided to patient.     KCCQF,JUVQQ, RN  04/13/2017  I have read this note and agree with its contents.  Alysia Penna, MD

## 2017-04-13 NOTE — Patient Instructions (Addendum)
Rachael [name] , Thank you for taking time to come for your Medicare Wellness Visit. I appreciate your ongoing commitment to your health goals. Please review the following plan we discussed and let me know if I can assist you in the future.   Shingrix is a vaccine for the prevention of Shingles in Adults 50 and older. Please check with your benefits regarding applicable copays or out of pocket expenses.  The Shingrix is given in 2 vaccines approx 8 weeks apart. You must receive the 2nd dose prior to 6 months from receipt of the first.  You can go to the CDC to learn more   Has mammogram every year and has been completed Bone density was improved by 10%   Bladder exercise To contract muscles used to void; x 20 rep x 3 times a day  Usually you will not see a benefit for 2 to 3 months   These are the goals we discussed: Goals    . Exercise 150 min/wk Moderate Activity     To be able to walk again [place] next month       This is a list of the screening recommended for you and due dates:  Health Maintenance  Topic Date Due  . Flu Shot  01/10/2098*  . Pneumonia vaccines (1 of 2 - PCV13) 01/10/2098*  . Tetanus Vaccine  02/23/2024  . DEXA scan (bone density measurement)  Completed  *Topic was postponed. The date shown is not the original due date.     Prevention of falls: Remove rugs or any tripping hazards in the home Use Non slip mats in bathtubs and showers Placing grab bars next to the toilet and or shower Placing handrails on both sides of the stair way Adding extra lighting in the home.   Personal safety issues reviewed:  1. Consider starting a community watch program per [facility] 2.  Changes batteries is smoke detector and/or carbon monoxide detector  3.  If you have firearms; keep them in a safe place 4.  Wear protection when in the sun; Always wear sunscreen or a hat; It is good to have your doctor check your skin annually or review any new areas of  concern 5. Driving safety; Keep in the right lane; stay 3 car lengths behind the car in front of you on the highway; look 3 times prior to pulling out; carry your cell phone everywhere you go!    Learn about the Yellow Dot program:  The program allows first responders at your emergency to have access to who your physician is, as well as your medications and medical conditions.  Citizens requesting the Yellow Dot Packages should contact Master Corporal Nunzio Cobbs at the [facility] [phone] for the first week of the program and beginning the week after Easter citizens should contact their Scientist, physiological.       Fall Prevention in the Home Falls can cause injuries. They can happen to people of all ages. There are many things you can do to make your home safe and to help prevent falls. What can I do on the outside of my home?  Regularly fix the edges of walkways and driveways and fix any cracks.  Remove anything that might make you trip as you walk through a door, such as a raised step or threshold.  Trim any bushes or trees on the path to your home.  Use bright outdoor lighting.  Clear any walking paths of anything  that might make someone trip, such as rocks or tools.  Regularly check to see if handrails are loose or broken. Make sure that both sides of any steps have handrails.  Any raised decks and porches should have guardrails on the edges.  Have any leaves, snow, or ice cleared regularly.  Use sand or salt on walking paths during winter.  Clean up any spills in your garage right away. This includes oil or grease spills. What can I do in the bathroom?  Use night lights.  Install grab bars by the toilet and in the tub and shower. Do not use towel bars as grab bars.  Use non-skid mats or decals in the tub or shower.  If you need to sit down in the shower, use a plastic, non-slip stool.  Keep the floor dry. Clean up any water that  spills on the floor as soon as it happens.  Remove soap buildup in the tub or shower regularly.  Attach bath mats securely with double-sided non-slip rug tape.  Do not have throw rugs and other things on the floor that can make you trip. What can I do in the bedroom?  Use night lights.  Make sure that you have a light by your bed that is easy to reach.  Do not use any sheets or blankets that are too big for your bed. They should not hang down onto the floor.  Have a firm chair that has side arms. You can use this for support while you get dressed.  Do not have throw rugs and other things on the floor that can make you trip. What can I do in the kitchen?  Clean up any spills right away.  Avoid walking on wet floors.  Keep items that you use a lot in easy-to-reach places.  If you need to reach something above you, use a strong step stool that has a grab bar.  Keep electrical cords out of the way.  Do not use floor polish or wax that makes floors slippery. If you must use wax, use non-skid floor wax.  Do not have throw rugs and other things on the floor that can make you trip. What can I do with my stairs?  Do not leave any items on the stairs.  Make sure that there are handrails on both sides of the stairs and use them. Fix handrails that are broken or loose. Make sure that handrails are as long as the stairways.  Check any carpeting to make sure that it is firmly attached to the stairs. Fix any carpet that is loose or worn.  Avoid having throw rugs at the top or bottom of the stairs. If you do have throw rugs, attach them to the floor with carpet tape.  Make sure that you have a light switch at the top of the stairs and the bottom of the stairs. If you do not have them, ask someone to add them for you. What else can I do to help prevent falls?  Wear shoes that: ? Do not have high heels. ? Have rubber bottoms. ? Are comfortable and fit you well. ? Are closed at the  toe. Do not wear sandals.  If you use a stepladder: ? Make sure that it is fully opened. Do not climb a closed stepladder. ? Make sure that both sides of the stepladder are locked into place. ? Ask someone to hold it for you, if possible.  Clearly mark and make sure that you  can see: ? Any grab bars or handrails. ? First and last steps. ? Where the edge of each step is.  Use tools that help you move around (mobility aids) if they are needed. These include: ? Canes. ? Walkers. ? Scooters. ? Crutches.  Turn on the lights when you go into a dark area. Replace any light bulbs as soon as they burn out.  Set up your furniture so you have a clear path. Avoid moving your furniture around.  If any of your floors are uneven, fix them.  If there are any pets around you, be aware of where they are.  Review your medicines with your doctor. Some medicines can make you feel dizzy. This can increase your chance of falling. Ask your doctor what other things that you can do to help prevent falls. This information is not intended to replace advice given to you by your health care provider. Make sure you discuss any questions you have with your health care provider. Document Released: 10/24/2008 Document Revised: 06/05/2015 Document Reviewed: 02/01/2014 Elsevier Interactive Patient Education  2018 [place] Maintenance, Female Adopting a healthy lifestyle and getting preventive care can go a long way to promote health and wellness. Talk with your health care provider about what schedule of regular examinations is right for you. This is a good chance for you to check in with your provider about disease prevention and staying healthy. In between checkups, there are plenty of things you can do on your own. Experts have done a lot of research about which lifestyle changes and preventive measures are most likely to keep you healthy. Ask your health care provider for more information. Weight  and diet Eat a healthy diet  Be sure to include plenty of vegetables, fruits, low-fat dairy products, and lean protein.  Do not eat a lot of foods high in solid fats, added sugars, or salt.  Get regular exercise. This is one of the most important things you can do for your health. ? Most adults should exercise for at least 150 minutes each week. The exercise should increase your heart rate and make you sweat (moderate-intensity exercise). ? Most adults should also do strengthening exercises at least twice a week. This is in addition to the moderate-intensity exercise.  Maintain a healthy weight  Body mass index (BMI) is a measurement that can be used to identify possible weight problems. It estimates body fat based on height and weight. Your health care provider can help determine your BMI and help you achieve or maintain a healthy weight.  For females 82 years of age and older: ? A BMI below 18.5 is considered underweight. ? A BMI of 18.5 to 24.9 is normal. ? A BMI of 25 to 29.9 is considered overweight. ? A BMI of 30 and above is considered obese.  Watch levels of cholesterol and blood lipids  You should start having your blood tested for lipids and cholesterol at 82 years of age, then have this test every 5 years.  You may need to have your cholesterol levels checked more often if: ? Your lipid or cholesterol levels are high. ? You are older than 82 years of age. ? You are at high risk for heart disease.  Cancer screening Lung Cancer  Lung cancer screening is recommended for adults 19-83 years old who are at high risk for lung cancer because of a history of smoking.  A yearly low-dose CT scan of the lungs is recommended for people  who: ? Currently smoke. ? Have quit within the past 15 years. ? Have at least a 30-pack-year history of smoking. A pack year is smoking an average of one pack of cigarettes a day for 1 year.  Yearly screening should continue until it has been 15  years since you quit.  Yearly screening should stop if you develop a health problem that would prevent you from having lung cancer treatment.  Breast Cancer  Practice breast self-awareness. This means understanding how your breasts normally appear and feel.  It also means doing regular breast self-exams. Let your health care provider know about any changes, no matter how small.  If you are in your 20s or 30s, you should have a clinical breast exam (CBE) by a health care provider every 1-3 years as part of a regular health exam.  If you are 101 or older, have a CBE every year. Also consider having a breast X-ray (mammogram) every year.  If you have a family history of breast cancer, talk to your health care provider about genetic screening.  If you are at high risk for breast cancer, talk to your health care provider about having an MRI and a mammogram every year.  Breast cancer gene (BRCA) assessment is recommended for women who have family members with BRCA-related cancers. BRCA-related cancers include: ? Breast. ? Ovarian. ? Tubal. ? Peritoneal cancers.  Results of the assessment will determine the need for genetic counseling and BRCA1 and BRCA2 testing.  Cervical Cancer Your health care provider may recommend that you be screened regularly for cancer of the pelvic organs (ovaries, uterus, and vagina). This screening involves a pelvic examination, including checking for microscopic changes to the surface of your cervix (Pap test). You may be encouraged to have this screening done every 3 years, beginning at age 62.  For women ages 35-65, health care providers may recommend pelvic exams and Pap testing every 3 years, or they may recommend the Pap and pelvic exam, combined with testing for human papilloma virus (HPV), every 5 years. Some types of HPV increase your risk of cervical cancer. Testing for HPV may also be done on women of any age with unclear Pap test results.  Other health  care providers may not recommend any screening for nonpregnant women who are considered low risk for pelvic cancer and who do not have symptoms. Ask your health care provider if a screening pelvic exam is right for you.  If you have had past treatment for cervical cancer or a condition that could lead to cancer, you need Pap tests and screening for cancer for at least 20 years after your treatment. If Pap tests have been discontinued, your risk factors (such as having a new sexual partner) need to be reassessed to determine if screening should resume. Some women have medical problems that increase the chance of getting cervical cancer. In these cases, your health care provider may recommend more frequent screening and Pap tests.  Colorectal Cancer  This type of cancer can be detected and often prevented.  Routine colorectal cancer screening usually begins at 82 years of age and continues through 82 years of age.  Your health care provider may recommend screening at an earlier age if you have risk factors for colon cancer.  Your health care provider may also recommend using home test kits to check for hidden blood in the stool.  A small camera at the end of a tube can be used to examine your colon directly (sigmoidoscopy  or colonoscopy). This is done to check for the earliest forms of colorectal cancer.  Routine screening usually begins at age 55.  Direct examination of the colon should be repeated every 5-10 years through 82 years of age. However, you may need to be screened more often if early forms of precancerous polyps or small growths are found.  Skin Cancer  Check your skin from head to toe regularly.  Tell your health care provider about any new moles or changes in moles, especially if there is a change in a mole's shape or color.  Also tell your health care provider if you have a mole that is larger than the size of a pencil eraser.  Always use sunscreen. Apply sunscreen liberally  and repeatedly throughout the day.  Protect yourself by wearing long sleeves, pants, a wide-brimmed hat, and sunglasses whenever you are outside.  Heart disease, diabetes, and high blood pressure  High blood pressure causes heart disease and increases the risk of stroke. High blood pressure is more likely to develop in: ? People who have blood pressure in the high end of the normal range (130-139/85-89 mm Hg). ? People who are overweight or obese. ? People who are African American.  If you are 70-51 years of age, have your blood pressure checked every 3-5 years. If you are 67 years of age or older, have your blood pressure checked every year. You should have your blood pressure measured twice-once when you are at a hospital or clinic, and once when you are not at a hospital or clinic. Record the average of the two measurements. To check your blood pressure when you are not at a hospital or clinic, you can use: ? An automated blood pressure machine at a pharmacy. ? A home blood pressure monitor.  If you are between 65 years and 24 years old, ask your health care provider if you should take aspirin to prevent strokes.  Have regular diabetes screenings. This involves taking a blood sample to check your fasting blood sugar level. ? If you are at a normal weight and have a low risk for diabetes, have this test once every three years after 82 years of age. ? If you are overweight and have a high risk for diabetes, consider being tested at a younger age or more often. Preventing infection Hepatitis B  If you have a higher risk for hepatitis B, you should be screened for this virus. You are considered at high risk for hepatitis B if: ? You were born in a country where hepatitis B is common. Ask your health care provider which countries are considered high risk. ? Your parents were born in a high-risk country, and you have not been immunized against hepatitis B (hepatitis B vaccine). ? You have HIV  or AIDS. ? You use needles to inject street drugs. ? You live with someone who has hepatitis B. ? You have had sex with someone who has hepatitis B. ? You get hemodialysis treatment. ? You take certain medicines for conditions, including cancer, organ transplantation, and autoimmune conditions.  Hepatitis C  Blood testing is recommended for: ? Everyone born from [birth year] through 1963. ? Anyone with known risk factors for hepatitis C.  Sexually transmitted infections (STIs)  You should be screened for sexually transmitted infections (STIs) including gonorrhea and chlamydia if: ? You are sexually active and are younger than 82 years of age. ? You are older than 81 years of age and your health care provider tells  you that you are at risk for this type of infection. ? Your sexual activity has changed since you were last screened and you are at an increased risk for chlamydia or gonorrhea. Ask your health care provider if you are at risk.  If you do not have HIV, but are at risk, it may be recommended that you take a prescription medicine daily to prevent HIV infection. This is called pre-exposure prophylaxis (PrEP). You are considered at risk if: ? You are sexually active and do not regularly use condoms or know the HIV status of your partner(s). ? You take drugs by injection. ? You are sexually active with a partner who has HIV.  Talk with your health care provider about whether you are at high risk of being infected with HIV. If you choose to begin PrEP, you should first be tested for HIV. You should then be tested every 3 months for as long as you are taking PrEP. Pregnancy  If you are premenopausal and you may become pregnant, ask your health care provider about preconception counseling.  If you may become pregnant, take 400 to 800 micrograms (mcg) of folic acid every day.  If you want to prevent pregnancy, talk to your health care provider about birth control  (contraception). Osteoporosis and menopause  Osteoporosis is a disease in which the bones lose minerals and strength with aging. This can result in serious bone fractures. Your risk for osteoporosis can be identified using a bone density scan.  If you are 4 years of age or older, or if you are at risk for osteoporosis and fractures, ask your health care provider if you should be screened.  Ask your health care provider whether you should take a calcium or vitamin D supplement to lower your risk for osteoporosis.  Menopause may have certain physical symptoms and risks.  Hormone replacement therapy may reduce some of these symptoms and risks. Talk to your health care provider about whether hormone replacement therapy is right for you. Follow these instructions at home:  Schedule regular health, dental, and eye exams.  Stay current with your immunizations.  Do not use any tobacco products including cigarettes, chewing tobacco, or electronic cigarettes.  If you are pregnant, do not drink alcohol.  If you are breastfeeding, limit how much and how often you drink alcohol.  Limit alcohol intake to no more than 1 drink per day for nonpregnant women. One drink equals 12 ounces of beer, 5 ounces of wine, or 1 ounces of hard liquor.  Do not use street drugs.  Do not share needles.  Ask your health care provider for help if you need support or information about quitting drugs.  Tell your health care provider if you often feel depressed.  Tell your health care provider if you have ever been abused or do not feel safe at home. This information is not intended to replace advice given to you by your health care provider. Make sure you discuss any questions you have with your health care provider. Document Released: 07/13/2010 Document Revised: 06/05/2015 Document Reviewed: 10/01/2014 Elsevier Interactive Patient Education  [employer].

## 2017-05-10 DIAGNOSIS — M48062 Spinal stenosis, lumbar region with neurogenic claudication: Secondary | ICD-10-CM | POA: Diagnosis not present

## 2017-05-10 DIAGNOSIS — Z7952 Long term (current) use of systemic steroids: Secondary | ICD-10-CM | POA: Diagnosis not present

## 2017-05-10 DIAGNOSIS — R29898 Other symptoms and signs involving the musculoskeletal system: Secondary | ICD-10-CM | POA: Diagnosis not present

## 2017-05-10 DIAGNOSIS — R531 Weakness: Secondary | ICD-10-CM | POA: Diagnosis not present

## 2017-05-13 DIAGNOSIS — M25551 Pain in right hip: Secondary | ICD-10-CM | POA: Diagnosis not present

## 2017-05-13 DIAGNOSIS — M160 Bilateral primary osteoarthritis of hip: Secondary | ICD-10-CM | POA: Diagnosis not present

## 2017-05-23 ENCOUNTER — Ambulatory Visit: Payer: PPO | Admitting: Family Medicine

## 2017-05-23 ENCOUNTER — Ambulatory Visit (INDEPENDENT_AMBULATORY_CARE_PROVIDER_SITE_OTHER): Payer: PPO | Admitting: Family Medicine

## 2017-05-23 ENCOUNTER — Encounter: Payer: Self-pay | Admitting: Family Medicine

## 2017-05-23 ENCOUNTER — Other Ambulatory Visit: Payer: Self-pay

## 2017-05-23 VITALS — BP 126/80 | HR 62 | Temp 98.1°F

## 2017-05-23 DIAGNOSIS — I872 Venous insufficiency (chronic) (peripheral): Secondary | ICD-10-CM | POA: Diagnosis not present

## 2017-05-23 DIAGNOSIS — R29898 Other symptoms and signs involving the musculoskeletal system: Secondary | ICD-10-CM | POA: Diagnosis not present

## 2017-05-23 NOTE — Progress Notes (Signed)
   Subjective:    Patient ID: Rachael [name], female    DOB: [birth year], 84 y.o.   MRN: [id number]  HPI Here to discuss several issues. First she had a lumbar laminectomy on 12-06-16 per Dr. Marcos Eke at [facility], and this went very well. Her back pain is gone and she is slowly regaining strength in her legs. She walks wit a walker. She is about to start water aerobics to increase her muscle tone. She does complain of intermittent swelling in the legs and they sometimes weep clear fluid. She cannot wear compression stockings because she cannot bend over to put them on by herself. She has had 6 sessions of a procedure per her chiropractor designed to strengthen her leg muscles. No SOB.    Review of Systems  Constitutional: Negative.   Respiratory: Negative.   Cardiovascular: Positive for leg swelling. Negative for chest pain and palpitations.  Neurological: Negative.        Objective:   Physical Exam  Constitutional: She appears well-developed and well-nourished.  Walks with her walker   Neck: No thyromegaly present.  Cardiovascular: Normal rate, regular rhythm, normal heart sounds and intact distal pulses.  Pulmonary/Chest: Effort normal and breath sounds normal.  Musculoskeletal:  Both lower legs have 3+ edema, no tenderness or warmth or erythema   Lymphadenopathy:    She has no cervical adenopathy.          Assessment & Plan:  She is recovering well from spinal surgery. I agree that she should start with water aerobics to strengthen her back first and then move up to more aggressive therapies like squats or leg extensions. She has venoud insufficiency in the legs. She should elevate them as much as possible. Increasing her activity will help. She should wear compression stockings as much as possible.  Alysia Penna, MD

## 2017-05-26 DIAGNOSIS — H43811 Vitreous degeneration, right eye: Secondary | ICD-10-CM | POA: Diagnosis not present

## 2017-05-26 DIAGNOSIS — H04123 Dry eye syndrome of bilateral lacrimal glands: Secondary | ICD-10-CM | POA: Diagnosis not present

## 2017-05-26 DIAGNOSIS — H25812 Combined forms of age-related cataract, left eye: Secondary | ICD-10-CM | POA: Diagnosis not present

## 2017-05-26 DIAGNOSIS — H35363 Drusen (degenerative) of macula, bilateral: Secondary | ICD-10-CM | POA: Diagnosis not present

## 2017-05-26 DIAGNOSIS — H25811 Combined forms of age-related cataract, right eye: Secondary | ICD-10-CM | POA: Diagnosis not present

## 2017-05-27 DIAGNOSIS — Z806 Family history of leukemia: Secondary | ICD-10-CM | POA: Diagnosis not present

## 2017-05-27 DIAGNOSIS — Z803 Family history of malignant neoplasm of breast: Secondary | ICD-10-CM | POA: Diagnosis not present

## 2017-05-27 DIAGNOSIS — Z8049 Family history of malignant neoplasm of other genital organs: Secondary | ICD-10-CM | POA: Diagnosis not present

## 2017-05-27 DIAGNOSIS — Z800 Family history of malignant neoplasm of digestive organs: Secondary | ICD-10-CM | POA: Diagnosis not present

## 2017-05-27 DIAGNOSIS — Z01419 Encounter for gynecological examination (general) (routine) without abnormal findings: Secondary | ICD-10-CM | POA: Diagnosis not present

## 2017-05-27 DIAGNOSIS — Z853 Personal history of malignant neoplasm of breast: Secondary | ICD-10-CM | POA: Diagnosis not present

## 2017-05-27 DIAGNOSIS — Z808 Family history of malignant neoplasm of other organs or systems: Secondary | ICD-10-CM | POA: Diagnosis not present

## 2017-05-30 ENCOUNTER — Other Ambulatory Visit: Payer: Self-pay | Admitting: Family Medicine

## 2017-05-30 NOTE — Telephone Encounter (Signed)
Pt last OV was 05/23/2017 and last refill was done on 11/11/2016 for 120 with 2 refills. Please advise if ok to refill

## 2017-05-31 NOTE — Telephone Encounter (Signed)
Call in #[phone] with 2 rf 

## 2017-06-09 ENCOUNTER — Encounter: Payer: Self-pay | Admitting: Family Medicine

## 2017-06-09 ENCOUNTER — Ambulatory Visit (INDEPENDENT_AMBULATORY_CARE_PROVIDER_SITE_OTHER): Payer: PPO | Admitting: Family Medicine

## 2017-06-09 VITALS — HR 67 | Temp 98.3°F | Ht 68.0 in

## 2017-06-09 DIAGNOSIS — S40022A Contusion of left upper arm, initial encounter: Secondary | ICD-10-CM | POA: Diagnosis not present

## 2017-06-09 NOTE — Progress Notes (Signed)
   Subjective:    Patient ID: Rachael [name], female    DOB: 1933-09-06, 84 y.o.   MRN: [id number]  HPI Here for a large tender bruise on the left upper arm that appeared 3 days ago. She woke up with it one morning. No recent falls or trauma. No swelling in the arm. She labs in March included normal liver enzymes and normal platelet count. She does take low dose Prednisone daily.    Review of Systems  Constitutional: Negative.   Respiratory: Negative.   Cardiovascular: Negative.   Musculoskeletal: Positive for myalgias.  Neurological: Negative.        Objective:   Physical Exam  Constitutional: She appears well-developed and well-nourished.  Cardiovascular: Normal rate, regular rhythm, normal heart sounds and intact distal pulses.  Pulmonary/Chest: Effort normal and breath sounds normal.  Musculoskeletal:  The left shoulder is tender over the deltoid bursa, and ROM is limited by pain. There is a large ecchymosis left anterior upper arm. No swelling in the arm or hand           Assessment & Plan:  Bruising and tenderness of uncertain etiology. Perhaps her daily Prednisone has thinned her blood a little. She will rest the arm, use Tramadol for pain, and apply ice packs 2-3 times daily. Recheck prn.  Alysia Penna, MD

## 2017-07-04 DIAGNOSIS — Z809 Family history of malignant neoplasm, unspecified: Secondary | ICD-10-CM | POA: Diagnosis not present

## 2017-07-11 ENCOUNTER — Telehealth: Payer: Self-pay | Admitting: *Deleted

## 2017-07-11 NOTE — Telephone Encounter (Signed)
Sent to PCP to set pt up for a referral for a [facility] Doctor @ [facility] [place]

## 2017-07-11 NOTE — Telephone Encounter (Signed)
Copied from [place] [id number]. Topic: Referral - Request >> Jul 11, 2017 10:35 AM Yvette Rack wrote: Reason for CRM: pt states that she has spoke with Dr Sarajane Jews about her veins and would like a referral to a Vein Doctor about them in her legs she also states that she has a knot on the rt leg that she is concerned about   >> Jul 11, 2017 12:07 PM Judyann Munson wrote: Patient called and stated she would like her referral to be sent to [facility] [place] at [place], [place], [place], [address]  (Ph: [phone])

## 2017-07-12 ENCOUNTER — Encounter: Payer: Self-pay | Admitting: Family Medicine

## 2017-07-12 ENCOUNTER — Ambulatory Visit (INDEPENDENT_AMBULATORY_CARE_PROVIDER_SITE_OTHER): Payer: PPO | Admitting: Family Medicine

## 2017-07-12 VITALS — BP 120/50 | HR 52 | Temp 98.3°F | Ht 68.0 in

## 2017-07-12 DIAGNOSIS — I83893 Varicose veins of bilateral lower extremities with other complications: Secondary | ICD-10-CM

## 2017-07-12 DIAGNOSIS — I872 Venous insufficiency (chronic) (peripheral): Secondary | ICD-10-CM

## 2017-07-12 NOTE — Telephone Encounter (Signed)
This referral was done during her OV today

## 2017-07-12 NOTE — Progress Notes (Signed)
   Subjective:    Patient ID: Rachael [name], female    DOB: Jan 22, 1933, 84 y.o.   MRN: [id number]  HPI Here to check her legs. She has had venous insufficiency in both legs for years, but over the past 2 weeks she has had more swelling than usual and she has felt painful knots in both calves. She is worried about possible blood clots. No chest pain or SOB.    Review of Systems  Constitutional: Negative.   Respiratory: Negative.   Cardiovascular: Positive for leg swelling. Negative for chest pain and palpitations.       Objective:   Physical Exam  Constitutional: She appears well-developed and well-nourished.  Walks with her walker   Cardiovascular: Normal rate, regular rhythm, normal heart sounds and intact distal pulses.  Pulmonary/Chest: Effort normal and breath sounds normal. No stridor. No respiratory distress. She has no wheezes. She has no rales.  Musculoskeletal:  Both lower legs have varicose veins with swelling. There are firm tender lumps just under the skin on both calves. Homans are negative. No erythema or warmth.            Assessment & Plan:  Venous insufficiency of both legs with varicose veins. We will set up dopplers asap to look for any DVT's. Per her request we will refer her to [facility] vascular surgery also.  Alysia Penna, MD

## 2017-07-13 ENCOUNTER — Ambulatory Visit (HOSPITAL_COMMUNITY)
Admission: RE | Admit: 2017-07-13 | Discharge: 2017-07-13 | Disposition: A | Discharge status: Home / Self Care | Payer: PPO | Source: Ambulatory Visit | Attending: Cardiology | Admitting: Cardiology

## 2017-07-13 ENCOUNTER — Telehealth: Payer: Self-pay | Admitting: Family Medicine

## 2017-07-13 DIAGNOSIS — I83893 Varicose veins of bilateral lower extremities with other complications: Secondary | ICD-10-CM | POA: Insufficient documentation

## 2017-07-13 NOTE — Telephone Encounter (Signed)
Copied from [place] [id number]. Topic: Quick Communication - See Telephone Encounter >> Jul 13, 2017  2:46 PM Bea Graff, NT wrote: CRM for notification. See Telephone encounter for: 07/13/17. Pt states she needs a note from Dr. Sarajane Jews that she is cleared to start working out again at her gym. Her gym requires a note prior to her being able to work out. Pt would like to pick the note up. Please call when ready.

## 2017-07-15 ENCOUNTER — Encounter: Payer: Self-pay | Admitting: *Deleted

## 2017-07-15 NOTE — Telephone Encounter (Signed)
This note is ready and in your folder

## 2017-07-15 NOTE — Telephone Encounter (Signed)
Letter completed and I called the pt and left a detailed message this was left at the front desk for pick up.

## 2017-07-19 DIAGNOSIS — L821 Other seborrheic keratosis: Secondary | ICD-10-CM | POA: Diagnosis not present

## 2017-07-19 DIAGNOSIS — L814 Other melanin hyperpigmentation: Secondary | ICD-10-CM | POA: Diagnosis not present

## 2017-07-19 DIAGNOSIS — I8310 Varicose veins of unspecified lower extremity with inflammation: Secondary | ICD-10-CM | POA: Diagnosis not present

## 2017-07-19 DIAGNOSIS — Z85828 Personal history of other malignant neoplasm of skin: Secondary | ICD-10-CM | POA: Diagnosis not present

## 2017-07-19 DIAGNOSIS — D225 Melanocytic nevi of trunk: Secondary | ICD-10-CM | POA: Diagnosis not present

## 2017-07-19 DIAGNOSIS — L819 Disorder of pigmentation, unspecified: Secondary | ICD-10-CM | POA: Diagnosis not present

## 2017-07-22 ENCOUNTER — Telehealth: Payer: Self-pay | Admitting: *Deleted

## 2017-07-22 DIAGNOSIS — I83813 Varicose veins of bilateral lower extremities with pain: Secondary | ICD-10-CM

## 2017-07-22 NOTE — Telephone Encounter (Signed)
Referral was placed on 7/3 to see a vascular surgeon.  Sent to PCP to advise

## 2017-07-22 NOTE — Telephone Encounter (Signed)
Called and spoke with pt she stated that [facility] did not take care of what her issues is for varosco veins. Will need Dr. Sarajane Jews to redo referral to someone else that Dr. Sarajane Jews recommends.

## 2017-07-22 NOTE — Telephone Encounter (Signed)
This is the referral I already did

## 2017-07-22 NOTE — Telephone Encounter (Signed)
Copied from [place] [id number]. Topic: Referral - Request >> Jul 11, 2017 10:35 AM Yvette Rack wrote: Reason for CRM: pt states that she has spoke with Dr Sarajane Jews about her veins and would like a referral to a Vein Doctor about them in her legs she also states that she has a knot on the rt leg that she is concerned about   >> Jul 11, 2017 12:07 PM Judyann Munson wrote: Patient called and stated she would like her referral to be sent to [facility] [place], [place], [place], [address]  (Ph: [phone])  >> Jul 22, 2017  3:17 PM Conception Chancy, NT wrote: Patient is calling to check on the status of this.

## 2017-07-25 NOTE — Telephone Encounter (Signed)
I understand. I will refer her to the [facility]

## 2017-07-25 NOTE — Telephone Encounter (Signed)
Called and spoke with pt. Pt advised and voiced understanding.  

## 2017-07-28 DIAGNOSIS — I8312 Varicose veins of left lower extremity with inflammation: Secondary | ICD-10-CM | POA: Diagnosis not present

## 2017-07-28 DIAGNOSIS — I83893 Varicose veins of bilateral lower extremities with other complications: Secondary | ICD-10-CM | POA: Diagnosis not present

## 2017-07-28 DIAGNOSIS — I83813 Varicose veins of bilateral lower extremities with pain: Secondary | ICD-10-CM | POA: Diagnosis not present

## 2017-07-28 DIAGNOSIS — I8311 Varicose veins of right lower extremity with inflammation: Secondary | ICD-10-CM | POA: Diagnosis not present

## 2017-08-09 DIAGNOSIS — Z7952 Long term (current) use of systemic steroids: Secondary | ICD-10-CM | POA: Diagnosis not present

## 2017-08-09 DIAGNOSIS — R202 Paresthesia of skin: Secondary | ICD-10-CM | POA: Diagnosis not present

## 2017-08-09 DIAGNOSIS — T380X5D Adverse effect of glucocorticoids and synthetic analogues, subsequent encounter: Secondary | ICD-10-CM | POA: Diagnosis not present

## 2017-08-09 DIAGNOSIS — Z888 Allergy status to other drugs, medicaments and biological substances status: Secondary | ICD-10-CM | POA: Diagnosis not present

## 2017-08-09 DIAGNOSIS — Z885 Allergy status to narcotic agent status: Secondary | ICD-10-CM | POA: Diagnosis not present

## 2017-08-09 DIAGNOSIS — R200 Anesthesia of skin: Secondary | ICD-10-CM | POA: Diagnosis not present

## 2017-08-09 DIAGNOSIS — M75102 Unspecified rotator cuff tear or rupture of left shoulder, not specified as traumatic: Secondary | ICD-10-CM | POA: Diagnosis not present

## 2017-08-09 DIAGNOSIS — Z886 Allergy status to analgesic agent status: Secondary | ICD-10-CM | POA: Diagnosis not present

## 2017-08-09 DIAGNOSIS — R531 Weakness: Secondary | ICD-10-CM | POA: Diagnosis not present

## 2017-08-10 DIAGNOSIS — R5383 Other fatigue: Secondary | ICD-10-CM | POA: Diagnosis not present

## 2017-08-10 DIAGNOSIS — R947 Abnormal results of other endocrine function studies: Secondary | ICD-10-CM | POA: Diagnosis not present

## 2017-08-10 DIAGNOSIS — E559 Vitamin D deficiency, unspecified: Secondary | ICD-10-CM | POA: Diagnosis not present

## 2017-08-10 DIAGNOSIS — R5382 Chronic fatigue, unspecified: Secondary | ICD-10-CM | POA: Diagnosis not present

## 2017-08-10 DIAGNOSIS — E018 Other iodine-deficiency related thyroid disorders and allied conditions: Secondary | ICD-10-CM | POA: Diagnosis not present

## 2017-08-16 DIAGNOSIS — I83893 Varicose veins of bilateral lower extremities with other complications: Secondary | ICD-10-CM | POA: Diagnosis not present

## 2017-08-16 DIAGNOSIS — I872 Venous insufficiency (chronic) (peripheral): Secondary | ICD-10-CM | POA: Diagnosis not present

## 2017-08-19 DIAGNOSIS — H2513 Age-related nuclear cataract, bilateral: Secondary | ICD-10-CM | POA: Diagnosis not present

## 2017-08-19 DIAGNOSIS — H25043 Posterior subcapsular polar age-related cataract, bilateral: Secondary | ICD-10-CM | POA: Diagnosis not present

## 2017-08-19 DIAGNOSIS — H18413 Arcus senilis, bilateral: Secondary | ICD-10-CM | POA: Diagnosis not present

## 2017-08-19 DIAGNOSIS — H25013 Cortical age-related cataract, bilateral: Secondary | ICD-10-CM | POA: Diagnosis not present

## 2017-08-19 DIAGNOSIS — H02834 Dermatochalasis of left upper eyelid: Secondary | ICD-10-CM | POA: Diagnosis not present

## 2017-08-24 ENCOUNTER — Ambulatory Visit: Payer: Self-pay | Admitting: Family Medicine

## 2017-08-24 NOTE — Telephone Encounter (Signed)
Pt called c/o left arm pain and warmth and neck pain after receiving a shingles vaccination. Pt stated that there is a small amount of redness at the injection site, pain at the site and pain that goes up the side of her neck and pain from elbow to her neck, warmth from the elbow to shoulder is warm.  Pain is a shooting pain and is a 6/10 but the closer it goes to the neck , the pain is a 10/10.  Pt denies fever. Pt stated she has never had a reaction to a vaccination before. Pt stated that the place where she was injected "seemed to high up my arm."  Home care advice given to the pt and pt stated understanding.   Reason for Disposition . Shingles (Herpes zoster; Shingrix) vaccine reactions  Answer Assessment - Initial Assessment Questions 1. SYMPTOMS: "What is the main symptom?" (e.g., redness, swelling, pain)   left arm  Small amount of Redness at injection site, pain at the site and pain that goes up the side of her neck, and pain from elbow to neck, warmth from the elbow up is warm or feverish 2. ONSET: "When was the vaccine (shot) given?" "How much later did the Shingles __ begin?" (e.g., hours, days ago)      Given yesterday  3. SEVERITY: "How bad is it?"      Pain is a shooting pain pain is 6-10 but the closer it goes to the neck the pain is a 10.  4. FEVER: "Is there a fever?" If so, ask: "What is it, how was it measured, and when did it start?"      no 5. IMMUNIZATIONS GIVEN: "What shots have you recently received?"     Shingles 6. PAST REACTIONS: "Have you reacted to immunizations before?" If so, ask: "What happened?"     no 7. OTHER SYMPTOMS: "Do you have any other symptoms?"     no  Protocols used: IMMUNIZATION REACTIONS-A-AH

## 2017-08-26 DIAGNOSIS — R7309 Other abnormal glucose: Secondary | ICD-10-CM | POA: Diagnosis not present

## 2017-08-26 DIAGNOSIS — E039 Hypothyroidism, unspecified: Secondary | ICD-10-CM | POA: Diagnosis not present

## 2017-08-26 DIAGNOSIS — E063 Autoimmune thyroiditis: Secondary | ICD-10-CM | POA: Diagnosis not present

## 2017-08-26 DIAGNOSIS — C50819 Malignant neoplasm of overlapping sites of unspecified female breast: Secondary | ICD-10-CM | POA: Diagnosis not present

## 2017-08-26 DIAGNOSIS — G729 Myopathy, unspecified: Secondary | ICD-10-CM | POA: Diagnosis not present

## 2017-08-26 DIAGNOSIS — E559 Vitamin D deficiency, unspecified: Secondary | ICD-10-CM | POA: Diagnosis not present

## 2017-08-29 ENCOUNTER — Encounter: Payer: Self-pay | Admitting: Family Medicine

## 2017-08-29 ENCOUNTER — Ambulatory Visit (INDEPENDENT_AMBULATORY_CARE_PROVIDER_SITE_OTHER): Payer: PPO | Admitting: Family Medicine

## 2017-08-29 VITALS — BP 120/56 | HR 73 | Temp 98.3°F | Ht 68.0 in

## 2017-08-29 DIAGNOSIS — S51811A Laceration without foreign body of right forearm, initial encounter: Secondary | ICD-10-CM | POA: Diagnosis not present

## 2017-08-29 MED ORDER — PREDNISONE 1 MG PO TABS: 1.0000 mg | ORAL_TABLET | Freq: Every day | ORAL | 1 refills | Status: DC

## 2017-08-29 NOTE — Progress Notes (Signed)
   Subjective:    Patient ID: Rachael [name], female    DOB: 1933/08/30, 84 y.o.   MRN: [id number]  HPI Here to check a wound on her right forearm. She has no idea how she got it but she woke up 3 mornings ago with a lceration that was bleeding. There is no pain. It was puffy yesterday but this has receded since then. She cleaned it initially with peroxide and now she is dressing it with Polysporin daily. Her last TD was on 2016.    Review of Systems  Constitutional: Negative.   Respiratory: Negative.   Cardiovascular: Negative.   Skin: Positive for wound.       Objective:   Physical Exam  Constitutional: She appears well-developed and well-nourished.  Cardiovascular: Normal rate, regular rhythm, normal heart sounds and intact distal pulses.  Pulmonary/Chest: Effort normal and breath sounds normal.  Skin:  The right forearm has a superficial linear laceration about 2 cm long. The wound is closed and looks clean. There is an area of ecchymosis around it. It is not tender           Assessment & Plan:  Laceration, it looks good today. She will continue to dress it daily with Polysporin. Recheck prn. Alysia Penna, MD

## 2017-09-07 ENCOUNTER — Telehealth: Payer: Self-pay

## 2017-09-07 DIAGNOSIS — G720 Drug-induced myopathy: Secondary | ICD-10-CM

## 2017-09-07 DIAGNOSIS — T380X5A Adverse effect of glucocorticoids and synthetic analogues, initial encounter: Principal | ICD-10-CM

## 2017-09-07 DIAGNOSIS — I83893 Varicose veins of bilateral lower extremities with other complications: Secondary | ICD-10-CM | POA: Diagnosis not present

## 2017-09-07 NOTE — Telephone Encounter (Signed)
Dr Sarajane Jews please advise on the referral to endocrinology per the pts request.  She is wanting someone that you recommend.

## 2017-09-07 NOTE — Telephone Encounter (Signed)
Copied from [place] [id number]. Topic: Referral - Request >> Sep 07, 2017  1:33 PM Bea Graff, NT wrote: Reason for CRM: Pt would like a referral to a endocrinologist to help her come off prednisone. She would like someone that Dr. Sarajane Jews recommends.

## 2017-09-08 NOTE — Telephone Encounter (Signed)
Called and spoke with pt. Pt advised and voiced understanding.  

## 2017-09-08 NOTE — Telephone Encounter (Signed)
The referral was done  

## 2017-10-17 ENCOUNTER — Other Ambulatory Visit: Payer: Self-pay | Admitting: Family Medicine

## 2017-10-17 DIAGNOSIS — Z1231 Encounter for screening mammogram for malignant neoplasm of breast: Secondary | ICD-10-CM

## 2017-10-19 DIAGNOSIS — I83893 Varicose veins of bilateral lower extremities with other complications: Secondary | ICD-10-CM | POA: Diagnosis not present

## 2017-11-08 ENCOUNTER — Encounter: Payer: Self-pay | Admitting: Gastroenterology

## 2017-11-17 DIAGNOSIS — R5382 Chronic fatigue, unspecified: Secondary | ICD-10-CM | POA: Diagnosis not present

## 2017-11-17 DIAGNOSIS — R947 Abnormal results of other endocrine function studies: Secondary | ICD-10-CM | POA: Diagnosis not present

## 2017-11-17 DIAGNOSIS — E018 Other iodine-deficiency related thyroid disorders and allied conditions: Secondary | ICD-10-CM | POA: Diagnosis not present

## 2017-11-17 DIAGNOSIS — E538 Deficiency of other specified B group vitamins: Secondary | ICD-10-CM | POA: Diagnosis not present

## 2017-11-17 DIAGNOSIS — E559 Vitamin D deficiency, unspecified: Secondary | ICD-10-CM | POA: Diagnosis not present

## 2017-11-17 DIAGNOSIS — M818 Other osteoporosis without current pathological fracture: Secondary | ICD-10-CM | POA: Diagnosis not present

## 2017-11-17 DIAGNOSIS — R5383 Other fatigue: Secondary | ICD-10-CM | POA: Diagnosis not present

## 2017-11-18 ENCOUNTER — Encounter: Payer: Self-pay | Admitting: Endocrinology

## 2017-11-18 ENCOUNTER — Ambulatory Visit (INDEPENDENT_AMBULATORY_CARE_PROVIDER_SITE_OTHER): Payer: PPO | Admitting: Endocrinology

## 2017-11-18 DIAGNOSIS — E279 Disorder of adrenal gland, unspecified: Secondary | ICD-10-CM | POA: Diagnosis not present

## 2017-11-18 LAB — CORTISOL
Cortisol, Plasma: 4.9 ug/dL
Cortisol, Plasma: 19.9 ug/dL

## 2017-11-18 MED ORDER — COSYNTROPIN NICU IV SYRINGE 0.25 MG/ML (STANDARD DOSE)
0.2500 mg | Freq: Once | INTRAVENOUS | Status: AC
  Administered 2017-11-18: 0.25 mg via INTRAMUSCULAR

## 2017-11-18 NOTE — Patient Instructions (Signed)
Please continue the same thyroid medication.   blood tests are requested for you today.  We'll let you know about the results.  If we find normal results, the next step is to see why you have symptoms when you stop the prednisone, because it may be partially treating some condition.

## 2017-11-18 NOTE — Progress Notes (Signed)
Subjective:    Patient ID: Rachael [name], female    DOB: 09-06-1933, 84 y.o.   MRN: [id number]  HPI Pt is referred by Dr Sarajane Jews. for HPA insuff.  She was rx'ed prednisone in 2017, for poss PMR.  She presented with severe weakness of the legs, and assoc back pain.  She has no h/o abdominal or brain injury.  No h/o seizures, hypoglycemia, amyloidosis, tuberculosis, or diabetes.   No h/o ketoconazole, rifampin, or dilantin.  She was once on 20 mg qd, but has been on current dosage of 1 mg qd x 6-12 months.  She says breast cancer is cured.  Severe leg weakness recurs if she misses even 2 days of prednisone.   Past Medical History:  Diagnosis Date  . Breast cancer (HCC)    R  . CAROTID STENOSIS   . Chronic venous insufficiency   . CONTACT DERMATITIS   . DUPUYTREN'S CONTRACTURE   . Edema    pt  states had edema of feet and legs has been on lasix per dr. to help  . GLUCOSE INTOLERANCE   . HLD (hyperlipidemia)   . Hypothyroidism    Dr. Ala Dach in [place]   . MENOPAUSAL SYNDROME   . Obesity   . Osteoarthritis    bilat hips, severe  . PVD (peripheral vascular disease) ([place])    mild carotid 11/05  . VITAMIN D DEFICIENCY     Past Surgical History:  Procedure Laterality Date  . 2D echo  11/29/03   normal LV size. normal EF   . [place]   lumbarectomy  . BREAST BIOPSY  05/11/11   right breast  . BREAST LUMPECTOMY     right breast  . CHOLECYSTECTOMY    . FINGER SURGERY    . HERNIA REPAIR     incisional hernia  . INGUINAL HERNIA REPAIR    . JOINT REPLACEMENT Bilateral    Dr. Maureen Ralphs  . normal caronary arteries     per pt. 2003  . right rotator cuff surgery    . TONSILLECTOMY    . TOTAL HIP ARTHROPLASTY  05/2008 and 10/2008   B hip - alusio    Social History   Socioeconomic History  . Marital status: Divorced    Spouse name: Not on file  . Number of children: Not on file  . Years of education: Not on file  . Highest education level: Not on file    Occupational History  . Not on file  Social Needs  . Financial resource strain: Not on file  . Food insecurity:    Worry: Not on file    Inability: Not on file  . Transportation needs:    Medical: Not on file    Non-medical: Not on file  Tobacco Use  . Smoking status: Never Smoker  . Smokeless tobacco: Never Used  Substance and Sexual Activity  . Alcohol use: No    Alcohol/week: 0.0 standard drinks  . Drug use: No  . Sexual activity: Not Currently  Lifestyle  . Physical activity:    Days per week: Not on file    Minutes per session: Not on file  . Stress: Not on file  Relationships  . Social connections:    Talks on phone: Not on file    Gets together: Not on file    Attends religious service: Not on file    Active member of club or organization: Not on file    Attends meetings of  clubs or organizations: Not on file    Relationship status: Not on file  . Intimate partner violence:    Fear of current or ex partner: Not on file    Emotionally abused: Not on file    Physically abused: Not on file    Forced sexual activity: Not on file  Other Topics Concern  . Not on file  Social History Narrative   Divorced - helps care for elderly mother.   Prior work-sales. Water aerobics 6/week- 3d water, 3d cardio.     Current Outpatient Medications on File Prior to Visit  Medication Sig Dispense Refill  . Cholecalciferol 5000 units TABS Take 1 tablet by mouth daily.    . Cyanocobalamin (B-12 PO) Take 4,500 mcg by mouth daily.    Marland Kitchen levothyroxine (SYNTHROID, LEVOTHROID) 112 MCG tablet     . predniSONE (DELTASONE) 1 MG tablet Take 1 tablet (1 mg total) by mouth daily with breakfast. 250 tablet 1  . traMADol (ULTRAM) 50 MG tablet TAKE 1 TABLET BY MOUTH EVERY 6 HOURS AS NEEDED FOR MODERATE PAIN  120 tablet 2   No current facility-administered medications on file prior to visit.     Allergies  Allergen Reactions  . Shellfish Allergy Anaphylaxis and Hives    Hives all over the  body.  . Percocet [Oxycodone-Acetaminophen] Hives  . Iodinated Diagnostic Agents     PT IS NOT AWARE OF IODINE ALLERGY, PREMEDICATED FOR PRIOR PREMEDS ONLY//A.C.  . Iodine Hives  . Prednisone     Causes nausea at higher doses   . Ultram [Tramadol Hcl]     Family History  Problem Relation Age of Onset  . Uterine cancer Mother 61  . Cancer Mother   . Breast cancer Daughter 48       BRCA negative (no BART)  . Cancer Daughter   . Pancreatic cancer Maternal Grandfather        diagnosed in his 33s  . Prostate cancer Maternal Uncle        diagnosed in his 14s  . Colon cancer Cousin   . Prostate cancer Paternal Uncle        diagnosed in his 75s  . Clotting disorder Maternal Grandmother   . Leukemia Cousin   . Heart disease Father        before age 43  . Varicose Veins Father   . Heart disease Brother        before age 31  . Coronary artery disease Unknown        family hx - female 1st degree relative <60  . Hypothyroidism Unknown        aunt   . Uterine cancer Cousin   . Prostate cancer Cousin   . Cancer Sister   . Anesthesia problems Neg Hx   . Adrenal disorder Neg Hx     BP 130/68 (BP Location: Left Arm, Patient Position: Sitting, Cuff Size: Large)   Pulse 71   Ht 5' 8"  (1.727 m)   Wt 199 lb 12.8 oz (90.6 kg)   SpO2 94%   BMI 30.38 kg/m    Review of Systems Denies fever, weight loss, fatigue, headache, dizziness, sob, palpitations, blurry vision, n/v, abd pain, memory loss, seizure, cold intolerance, anxiety, diarrhea, vitiligo, excessive sweating, or change in skin tone.  Muscle weakness is improved, but not back to normal.  She has easy bruising.     Objective:   Physical Exam VS: see vs page GEN: no distress HEAD: head: no deformity  eyes: no periorbital swelling, no proptosis external nose and ears are normal mouth: no lesion seen NECK: supple, thyroid is not enlarged CHEST WALL: no deformity LUNGS: clear to auscultation CV: reg rate and rhythm, no  murmur ABD: abdomen is soft, nontender.  no hepatosplenomegaly.  not distended.  no hernia MUSCULOSKELETAL: muscle bulk and strength are grossly normal.  no obvious joint swelling.  gait is steady with a walker EXTEMITIES: no deformity.  no ulcer on the feet.  feet are of normal color and temp.  Trace bilat leg edema.   PULSES: dorsalis pedis intact bilat.  no carotid bruit.   NEURO:  cn 2-12 grossly intact.   readily moves all 4's.  sensation is intact to touch on the feet SKIN:  Normal texture and temperature.  No rash or suspicious lesion is visible.  Ecchymoses of the hands and forearms.  NODES:  None palpable at the neck PSYCH: alert, well-oriented.  Does not appear anxious nor depressed.  outside test results are reviewed: TSH=0.8 Free T4=1.57  I have reviewed outside records, and summarized: Pt was noted to have chronic HPA suppression, and referred here.  She was seen by rheumatology.  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  ACTH stimulation test is done: baseline cortisol level=5 then Cosyntropin 250 mcg is given im 45 minutes later, cortisol level=20 (normal response)     Assessment & Plan:  HPA insuff, resolved.  She does not need prednisone for HPA replacement. Hypothyroidism: well-replaced  Patient Instructions  Please continue the same thyroid medication.   blood tests are requested for you today.  We'll let you know about the results.  If we find normal results, the next step is to see why you have symptoms when you stop the prednisone, because it may be partially treating some condition.

## 2017-11-21 ENCOUNTER — Inpatient Hospital Stay: Admission: RE | Admit: 2017-11-21 | Payer: PPO | Source: Ambulatory Visit

## 2017-11-23 ENCOUNTER — Telehealth: Payer: Self-pay | Admitting: Endocrinology

## 2017-11-23 NOTE — Telephone Encounter (Signed)
Patient has called in reference to her adrenal glands and was wondering what doctor was going to be filling her predniSONE (DELTASONE) 1 MG tablet. Please advise, thanks

## 2017-11-24 NOTE — Telephone Encounter (Signed)
Please call pt

## 2017-11-24 NOTE — Telephone Encounter (Signed)
Called pt and informed of Dr. Ellison's advice. Verbalized acceptance and understanding. 

## 2017-11-24 NOTE — Telephone Encounter (Signed)
In my opinion, you don't need the medication.  Who you would ask depends on what symptoms you ar having.

## 2017-11-24 NOTE — Telephone Encounter (Signed)
Please advise 

## 2017-11-25 ENCOUNTER — Encounter: Payer: Self-pay | Admitting: Family Medicine

## 2017-11-25 ENCOUNTER — Ambulatory Visit (INDEPENDENT_AMBULATORY_CARE_PROVIDER_SITE_OTHER): Payer: PPO | Admitting: Family Medicine

## 2017-11-25 VITALS — BP 110/58 | HR 98 | Temp 98.3°F | Ht 68.0 in

## 2017-11-25 DIAGNOSIS — R29898 Other symptoms and signs involving the musculoskeletal system: Secondary | ICD-10-CM | POA: Diagnosis not present

## 2017-11-25 DIAGNOSIS — E279 Disorder of adrenal gland, unspecified: Secondary | ICD-10-CM

## 2017-11-25 DIAGNOSIS — M48061 Spinal stenosis, lumbar region without neurogenic claudication: Secondary | ICD-10-CM | POA: Diagnosis not present

## 2017-11-25 DIAGNOSIS — M353 Polymyalgia rheumatica: Secondary | ICD-10-CM

## 2017-11-25 MED ORDER — LEVOTHYROXINE SODIUM 112 MCG PO TABS: 112.0000 ug | ORAL_TABLET | Freq: Every day | ORAL | 3 refills | Status: DC

## 2017-11-25 NOTE — Progress Notes (Signed)
   Subjective:    Patient ID: [name], female    DOB: 1933/04/12, 84 y.o.   MRN: [id number]  HPI Here to follow up on leg weakness. She has been tapering down on Prednisone as we discussed. She saw Dr. Loanne Drilling and had a normal ACTH suppression test. 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 She is currently taking 1 mg every other day. She works out with a Physiological scientist at [employer] one day a week and she does exercises like riding a stationary bike at home the other days. She feels her legs are getting stronger and she is walking muc better. Her goal is to be off the walker by the end of the year.    Review of Systems  Constitutional: Negative.   Respiratory: Negative.   Cardiovascular: Negative.   Musculoskeletal: Negative for back pain.  Neurological: Positive for weakness.       Objective:   Physical Exam  Constitutional: She is oriented to person, place, and time. She appears well-developed and well-nourished.  Cardiovascular: Normal rate, regular rhythm, normal heart sounds and intact distal pulses.  Pulmonary/Chest: Effort normal and breath sounds normal.  Neurological: She is alert and oriented to person, place, and time.          Assessment & Plan:  Leg weakness. She will slowly taper down on the prednisone. At this point she will go down to taking 1/2 tablet (0.5 mg) every other day.  Alysia Penna, MD

## 2017-12-02 ENCOUNTER — Ambulatory Visit
Admission: RE | Admit: 2017-12-02 | Discharge: 2017-12-02 | Disposition: A | Discharge status: Home / Self Care | Payer: PPO | Source: Ambulatory Visit | Attending: Family Medicine | Admitting: Family Medicine

## 2017-12-02 DIAGNOSIS — Z1231 Encounter for screening mammogram for malignant neoplasm of breast: Secondary | ICD-10-CM

## 2017-12-05 ENCOUNTER — Ambulatory Visit: Payer: Self-pay | Admitting: *Deleted

## 2017-12-05 NOTE — Telephone Encounter (Signed)
Pt called stating that she has been coming off her prednisone that she has been taking for several years. She is down to the every other day and missed her dose yesterday.  Her question was where or not she needed to take the prednisone anymore. [facility] Misty contacted. Misty will send question to Dr Sarajane Jews and office will reach out to patient with answer. Pt was made aware of plan.  Reason for Disposition . Caller has NON-URGENT medication question about med that PCP prescribed and triager unable to answer question  Protocols used: MEDICATION QUESTION CALL-A-AH

## 2017-12-05 NOTE — Telephone Encounter (Signed)
Dr. Fry please advise. Thanks  

## 2017-12-06 NOTE — Telephone Encounter (Signed)
Called and spoke with pt and she stated that she has been off the medication since Friday and has not had any issues.  Dr. Sarajane Jews is aware.

## 2017-12-06 NOTE — Telephone Encounter (Signed)
I think she can go ahead and stop it

## 2018-01-13 ENCOUNTER — Other Ambulatory Visit: Payer: Self-pay | Admitting: Family Medicine

## 2018-01-13 NOTE — Telephone Encounter (Signed)
Dr. Fry please advise on refill of medication.  Thanks  

## 2018-01-13 NOTE — Telephone Encounter (Signed)
Done

## 2018-02-24 ENCOUNTER — Emergency Department (HOSPITAL_COMMUNITY): Payer: PPO

## 2018-02-24 ENCOUNTER — Encounter (HOSPITAL_COMMUNITY): Payer: Self-pay | Admitting: Emergency Medicine

## 2018-02-24 ENCOUNTER — Observation Stay (HOSPITAL_COMMUNITY)
Admission: EM | Admit: 2018-02-24 | Discharge: 2018-02-26 | Disposition: A | Discharge status: Home / Self Care | Payer: PPO | Attending: Internal Medicine | Admitting: Internal Medicine

## 2018-02-24 DIAGNOSIS — R918 Other nonspecific abnormal finding of lung field: Secondary | ICD-10-CM | POA: Diagnosis not present

## 2018-02-24 DIAGNOSIS — Z853 Personal history of malignant neoplasm of breast: Secondary | ICD-10-CM | POA: Diagnosis not present

## 2018-02-24 DIAGNOSIS — F419 Anxiety disorder, unspecified: Secondary | ICD-10-CM | POA: Insufficient documentation

## 2018-02-24 DIAGNOSIS — G454 Transient global amnesia: Secondary | ICD-10-CM | POA: Diagnosis not present

## 2018-02-24 DIAGNOSIS — R531 Weakness: Secondary | ICD-10-CM | POA: Diagnosis not present

## 2018-02-24 DIAGNOSIS — E039 Hypothyroidism, unspecified: Secondary | ICD-10-CM | POA: Diagnosis present

## 2018-02-24 DIAGNOSIS — Z8673 Personal history of transient ischemic attack (TIA), and cerebral infarction without residual deficits: Secondary | ICD-10-CM | POA: Diagnosis not present

## 2018-02-24 DIAGNOSIS — R001 Bradycardia, unspecified: Secondary | ICD-10-CM | POA: Diagnosis not present

## 2018-02-24 DIAGNOSIS — N183 Chronic kidney disease, stage 3 unspecified: Secondary | ICD-10-CM | POA: Diagnosis present

## 2018-02-24 DIAGNOSIS — F411 Generalized anxiety disorder: Secondary | ICD-10-CM | POA: Diagnosis present

## 2018-02-24 DIAGNOSIS — G459 Transient cerebral ischemic attack, unspecified: Secondary | ICD-10-CM | POA: Diagnosis present

## 2018-02-24 DIAGNOSIS — I6529 Occlusion and stenosis of unspecified carotid artery: Secondary | ICD-10-CM | POA: Diagnosis present

## 2018-02-24 DIAGNOSIS — R4781 Slurred speech: Secondary | ICD-10-CM | POA: Diagnosis not present

## 2018-02-24 DIAGNOSIS — G92 Toxic encephalopathy: Secondary | ICD-10-CM | POA: Diagnosis not present

## 2018-02-24 DIAGNOSIS — G9340 Encephalopathy, unspecified: Secondary | ICD-10-CM | POA: Diagnosis present

## 2018-02-24 DIAGNOSIS — R404 Transient alteration of awareness: Secondary | ICD-10-CM | POA: Diagnosis not present

## 2018-02-24 DIAGNOSIS — Z79899 Other long term (current) drug therapy: Secondary | ICD-10-CM | POA: Diagnosis not present

## 2018-02-24 DIAGNOSIS — N1831 Chronic kidney disease, stage 3a: Secondary | ICD-10-CM | POA: Diagnosis present

## 2018-02-24 DIAGNOSIS — I739 Peripheral vascular disease, unspecified: Secondary | ICD-10-CM | POA: Diagnosis not present

## 2018-02-24 DIAGNOSIS — R4182 Altered mental status, unspecified: Secondary | ICD-10-CM | POA: Diagnosis not present

## 2018-02-24 DIAGNOSIS — C50911 Malignant neoplasm of unspecified site of right female breast: Secondary | ICD-10-CM | POA: Diagnosis present

## 2018-02-24 DIAGNOSIS — I10 Essential (primary) hypertension: Secondary | ICD-10-CM | POA: Diagnosis not present

## 2018-02-24 LAB — COMPREHENSIVE METABOLIC PANEL
ALBUMIN: 4.2 g/dL (ref 3.5–5.0)
ALT: 12 U/L (ref 0–44)
ANION GAP: 13 (ref 5–15)
AST: 24 U/L (ref 15–41)
Alkaline Phosphatase: 71 U/L (ref 38–126)
BILIRUBIN TOTAL: 1.2 mg/dL (ref 0.3–1.2)
BUN: 28 mg/dL — AB (ref 8–23)
CHLORIDE: 107 mmol/L (ref 98–111)
CO2: 22 mmol/L (ref 22–32)
Calcium: 10.2 mg/dL (ref 8.9–10.3)
Creatinine, Ser: 1.13 mg/dL — ABNORMAL HIGH (ref 0.44–1.00)
GFR calc Af Amer: 52 mL/min — ABNORMAL LOW (ref >60)
GFR calc non Af Amer: 45 mL/min — ABNORMAL LOW (ref >60)
GLUCOSE: 110 mg/dL — AB (ref 70–99)
POTASSIUM: 4 mmol/L (ref 3.5–5.1)
Sodium: 142 mmol/L (ref 135–145)
TOTAL PROTEIN: 7 g/dL (ref 6.5–8.1)

## 2018-02-24 LAB — CBC
HEMATOCRIT: 43.8 % (ref 36.0–46.0)
Hemoglobin: 14 g/dL (ref 12.0–15.0)
MCH: 28.3 pg (ref 26.0–34.0)
MCHC: 32 g/dL (ref 30.0–36.0)
MCV: 88.5 fL (ref 80.0–100.0)
NRBC: 0 % (ref 0.0–0.2)
PLATELETS: 338 10*3/uL (ref 150–400)
RBC: 4.95 MIL/uL (ref 3.87–5.11)
RDW: 14.3 % (ref 11.5–15.5)
WBC: 14.1 10*3/uL — AB (ref 4.0–10.5)

## 2018-02-24 LAB — URINALYSIS, ROUTINE W REFLEX MICROSCOPIC
Bilirubin Urine: NEGATIVE
GLUCOSE, UA: NEGATIVE mg/dL
HGB URINE DIPSTICK: NEGATIVE
Ketones, ur: 20 mg/dL — AB
LEUKOCYTE UA: NEGATIVE
Nitrite: NEGATIVE
PH: 9 — AB (ref 5.0–8.0)
PROTEIN: NEGATIVE mg/dL
SPECIFIC GRAVITY, URINE: 1.017 (ref 1.005–1.030)

## 2018-02-24 LAB — DIFFERENTIAL
ABS IMMATURE GRANULOCYTES: 0.05 10*3/uL (ref 0.00–0.07)
BASOS ABS: 0 10*3/uL (ref 0.0–0.1)
Basophils Relative: 0 %
EOS ABS: 0.1 10*3/uL (ref 0.0–0.5)
EOS PCT: 1 %
Immature Granulocytes: 0 %
LYMPHS ABS: 1.9 10*3/uL (ref 0.7–4.0)
Lymphocytes Relative: 13 %
Monocytes Absolute: 1.4 10*3/uL — ABNORMAL HIGH (ref 0.1–1.0)
Monocytes Relative: 10 %
Neutro Abs: 10.8 10*3/uL — ABNORMAL HIGH (ref 1.7–7.7)
Neutrophils Relative %: 76 %

## 2018-02-24 LAB — AMMONIA: Ammonia: 23 umol/L (ref 9–35)

## 2018-02-24 LAB — APTT: aPTT: 27 seconds (ref 24–36)

## 2018-02-24 LAB — PROTIME-INR
INR: 0.95
PROTHROMBIN TIME: 12.6 s (ref 11.4–15.2)

## 2018-02-24 LAB — TSH: TSH: 1.269 u[IU]/mL (ref 0.350–4.500)

## 2018-02-24 MED ORDER — SODIUM CHLORIDE 0.9% FLUSH
3.0000 mL | Freq: Once | INTRAVENOUS | Status: DC
Start: 2018-02-24 — End: 2018-02-26
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
## 2018-02-24 NOTE — ED Notes (Signed)
Family at bedside, pt talking with family

## 2018-02-24 NOTE — ED Notes (Signed)
[facility] called to cancel code stroke per Dr Rory Percy

## 2018-02-24 NOTE — ED Notes (Signed)
Labs collected.
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
## 2018-02-24 NOTE — ED Notes (Signed)
[facility] called @2 :23 to activate code stroke
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
## 2018-02-25 ENCOUNTER — Observation Stay (HOSPITAL_COMMUNITY): Payer: PPO

## 2018-02-25 ENCOUNTER — Encounter (HOSPITAL_COMMUNITY): Payer: Self-pay | Admitting: Internal Medicine

## 2018-02-25 ENCOUNTER — Observation Stay (HOSPITAL_BASED_OUTPATIENT_CLINIC_OR_DEPARTMENT_OTHER): Payer: PPO

## 2018-02-25 DIAGNOSIS — G9340 Encephalopathy, unspecified: Secondary | ICD-10-CM | POA: Diagnosis not present

## 2018-02-25 DIAGNOSIS — F419 Anxiety disorder, unspecified: Secondary | ICD-10-CM | POA: Diagnosis not present

## 2018-02-25 DIAGNOSIS — Z853 Personal history of malignant neoplasm of breast: Secondary | ICD-10-CM | POA: Diagnosis not present

## 2018-02-25 DIAGNOSIS — I361 Nonrheumatic tricuspid (valve) insufficiency: Secondary | ICD-10-CM | POA: Diagnosis not present

## 2018-02-25 DIAGNOSIS — E039 Hypothyroidism, unspecified: Secondary | ICD-10-CM

## 2018-02-25 DIAGNOSIS — I351 Nonrheumatic aortic (valve) insufficiency: Secondary | ICD-10-CM | POA: Diagnosis not present

## 2018-02-25 DIAGNOSIS — Z79899 Other long term (current) drug therapy: Secondary | ICD-10-CM | POA: Diagnosis not present

## 2018-02-25 DIAGNOSIS — I739 Peripheral vascular disease, unspecified: Secondary | ICD-10-CM | POA: Diagnosis not present

## 2018-02-25 DIAGNOSIS — G454 Transient global amnesia: Secondary | ICD-10-CM | POA: Diagnosis not present

## 2018-02-25 DIAGNOSIS — Z8673 Personal history of transient ischemic attack (TIA), and cerebral infarction without residual deficits: Secondary | ICD-10-CM | POA: Diagnosis not present

## 2018-02-25 DIAGNOSIS — R001 Bradycardia, unspecified: Secondary | ICD-10-CM | POA: Diagnosis not present

## 2018-02-25 LAB — ECHOCARDIOGRAM COMPLETE: Weight: 3051.17 oz

## 2018-02-25 LAB — RAPID URINE DRUG SCREEN, HOSP PERFORMED
Amphetamines: NOT DETECTED
BARBITURATES: NOT DETECTED
Benzodiazepines: NOT DETECTED
Cocaine: NOT DETECTED
Opiates: NOT DETECTED
Tetrahydrocannabinol: NOT DETECTED

## 2018-02-25 LAB — LIPID PANEL
CHOL/HDL RATIO: 2.6 ratio
Cholesterol: 176 mg/dL (ref 0–200)
HDL: 69 mg/dL (ref >40)
LDL Cholesterol: 98 mg/dL (ref 0–99)
Triglycerides: 43 mg/dL (ref <150)
VLDL: 9 mg/dL (ref 0–40)

## 2018-02-25 LAB — INFLUENZA PANEL BY PCR (TYPE A & B)
Influenza A By PCR: NEGATIVE
Influenza B By PCR: NEGATIVE

## 2018-02-25 MED ORDER — VITAMIN D3 25 MCG (1000 UNIT) PO TABS
1000.0000 [IU] | ORAL_TABLET | Freq: Every day | ORAL | Status: DC
  Administered 2018-02-25 – 2018-02-26 (×2): 1000 [IU] via ORAL
  Filled 2018-02-25 (×4): qty 1

## 2018-02-25 MED ORDER — SODIUM CHLORIDE 0.45 % IV SOLN
INTRAVENOUS | Status: DC
  Administered 2018-02-25: 17:00:00 via INTRAVENOUS

## 2018-02-25 MED ORDER — TRAMADOL HCL 50 MG PO TABS
50.0000 mg | ORAL_TABLET | Freq: Four times a day (QID) | ORAL | Status: DC | PRN
  Administered 2018-02-25: 50 mg via ORAL
  Filled 2018-02-25 (×2): qty 1

## 2018-02-25 MED ORDER — ATORVASTATIN CALCIUM 40 MG PO TABS
40.0000 mg | ORAL_TABLET | Freq: Every day | ORAL | Status: DC
  Filled 2018-02-25: qty 1

## 2018-02-25 MED ORDER — ACETAMINOPHEN 650 MG RE SUPP: 650.0000 mg | RECTAL | Status: DC | PRN

## 2018-02-25 MED ORDER — SODIUM CHLORIDE 0.9 % IV SOLN
INTRAVENOUS | Status: DC
  Administered 2018-02-25: 01:00:00 via INTRAVENOUS

## 2018-02-25 MED ORDER — ASPIRIN EC 81 MG PO TBEC
81.0000 mg | DELAYED_RELEASE_TABLET | Freq: Every day | ORAL | Status: DC
  Administered 2018-02-25 – 2018-02-26 (×2): 81 mg via ORAL
  Filled 2018-02-25 (×2): qty 1

## 2018-02-25 MED ORDER — SENNOSIDES-DOCUSATE SODIUM 8.6-50 MG PO TABS: 1.0000 | ORAL_TABLET | Freq: Every evening | ORAL | Status: DC | PRN

## 2018-02-25 MED ORDER — STROKE: EARLY STAGES OF RECOVERY BOOK
Freq: Once | Status: AC
  Administered 2018-02-25: 01:00:00
  Filled 2018-02-25: qty 1

## 2018-02-25 MED ORDER — LEVOTHYROXINE SODIUM 112 MCG PO TABS
112.0000 ug | ORAL_TABLET | Freq: Every day | ORAL | Status: DC
  Administered 2018-02-25 – 2018-02-26 (×2): 112 ug via ORAL
  Filled 2018-02-25 (×2): qty 1

## 2018-02-25 MED ORDER — ACETAMINOPHEN 160 MG/5ML PO SOLN: 650.0000 mg | ORAL | Status: DC | PRN

## 2018-02-25 MED ORDER — ACETAMINOPHEN 325 MG PO TABS
650.0000 mg | ORAL_TABLET | ORAL | Status: DC | PRN
  Administered 2018-02-25: 650 mg via ORAL
  Filled 2018-02-25: qty 2

## 2018-02-25 MED ORDER — ENOXAPARIN SODIUM 40 MG/0.4ML ~~LOC~~ SOLN
40.0000 mg | SUBCUTANEOUS | Status: DC
  Administered 2018-02-25 – 2018-02-26 (×2): 40 mg via SUBCUTANEOUS
  Filled 2018-02-25 (×2): qty 0.4

## 2018-02-25 MED ORDER — CLOPIDOGREL BISULFATE 75 MG PO TABS
75.0000 mg | ORAL_TABLET | Freq: Every day | ORAL | Status: DC
  Administered 2018-02-25 – 2018-02-26 (×2): 75 mg via ORAL
  Filled 2018-02-25 (×2): qty 1
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
## 2018-02-25 NOTE — Progress Notes (Signed)
OT Screen Note  Patient Details Name: Rachael [name] MRN: [id number] DOB: [birth year]   Cancelled Treatment:    Reason Eval/Treat Not Completed: OT screened, no needs identified, will sign off. Pt reporting she feels back to her baseline. Son reports that pt has good family support and lives with her daughter. PT confirms that pt is performing near baseline function. Please re-consult if status changes.   [place], OTR/L Acute Rehab Pager: [phone] Office: [phone] 02/25/2018, 2:50 PM

## 2018-02-25 NOTE — Evaluation (Signed)
Physical Therapy Evaluation Patient Details Name: Rachael [name] MRN: [id number] DOB: 07-25-1933 Today's Date: 02/25/2018   History of Present Illness  Pt is an 85 y/o female admitted secondary to confusion. MRI was negative for any acute findings. PMH including but not limited to breast cancer, hypothyroidism and PVD.    Clinical Impression  Pt presented supine in bed with HOB elevated, awake and willing to participate in therapy session. Pt's daughter was present throughout as well. Prior to admission, pt reported that she ambulated with either her cane or rollator and was independent with ADLs. She also stated that she enjoys going "out and about" and exercises at the gym 2-3x/week. Pt currently requires supervision for bed mobility, min A for transfers and min guard to ambulate with RW. PT will continue to follow acutely to progress mobility as tolerated and to ensure a safe d/c home.    Follow Up Recommendations No PT follow up;Other (comment)(pt wishes to continue with her personal trainer)    Equipment Recommendations  None recommended by PT    Recommendations for Other Services       Precautions / Restrictions Precautions Precautions: None Restrictions Weight Bearing Restrictions: No      Mobility  Bed Mobility Overal bed mobility: Needs Assistance Bed Mobility: Supine to Sit;Sit to Supine     Supine to sit: Supervision Sit to supine: Supervision   General bed mobility comments: HOB elevated, increased time  Transfers Overall transfer level: Needs assistance Equipment used: Rolling walker (2 wheeled) Transfers: Sit to/from Stand Sit to Stand: Min assist         General transfer comment: good technique, increased time, assist to fully power into standing from EOB  Ambulation/Gait Ambulation/Gait assistance: Min guard Gait Distance (Feet): 25 Feet Assistive device: Rolling walker (2 wheeled) Gait Pattern/deviations: Step-through pattern;Decreased step length  - right;Decreased step length - left;Decreased stride length Gait velocity: decreased   General Gait Details: pt with slow, steady gait with use of RW, mild instability but no overt LOB or need for physical assistance, min guard for safety  Stairs            Wheelchair Mobility    Modified Rankin (Stroke Patients Only) Modified Rankin (Stroke Patients Only) Pre-Morbid Rankin Score: No symptoms Modified Rankin: Moderate disability     Balance Overall balance assessment: Needs assistance Sitting-balance support: Feet supported Sitting balance-Leahy Scale: Good     Standing balance support: Bilateral upper extremity supported Standing balance-Leahy Scale: Poor                               Pertinent Vitals/Pain Pain Assessment: No/denies pain    Home Living Family/patient expects to be discharged to:: Private residence Living Arrangements: Children Available Help at Discharge: Family;Available PRN/intermittently;Other (Comment)(only alone for 2-3 hrs during the day) Type of Home: House Home Access: Stairs to enter   [employer] of Steps: 3 Home Layout: One level Home Equipment: Clinical cytogeneticist - 4 wheels;Cane - single point      Prior Function Level of Independence: Independent with assistive device(s)         Comments: pt ambulates with either her cane or rollator; drives; exercises 4-4Y/[employer] (has a Physiological scientist)     Hand Dominance   Dominant Hand: Right    Extremity/Trunk Assessment   Upper Extremity Assessment Upper Extremity Assessment: Generalized weakness    Lower Extremity Assessment Lower Extremity Assessment: Generalized weakness  Communication   Communication: No difficulties  Cognition Arousal/Alertness: Awake/alert Behavior During Therapy: WFL for tasks assessed/performed Overall Cognitive Status: Within Functional Limits for tasks assessed Area of Impairment: Memory                      Memory: Decreased short-term memory         General Comments: pt appears to be at baseline or very close to baseline      General Comments      Exercises     Assessment/Plan    PT Assessment Patient needs continued PT services  PT Problem List Decreased strength;Decreased balance;Decreased mobility;Decreased coordination       PT Treatment Interventions DME instruction;Gait training;Stair training;Therapeutic activities;Functional mobility training;Therapeutic exercise;Balance training;Neuromuscular re-education;Patient/family education    PT Goals (Current goals can be found in the Care Plan section)  Acute Rehab PT Goals Patient Stated Goal: get back to driving and going to the gym PT Goal Formulation: With patient/family Time For Goal Achievement: 03/11/18 Potential to Achieve Goals: Good    Frequency Min 3X/week   Barriers to discharge        Co-evaluation               AM-PAC PT "6 Clicks" Mobility  Outcome Measure Help needed turning from your back to your side while in a flat bed without using bedrails?: None Help needed moving from lying on your back to sitting on the side of a flat bed without using bedrails?: None Help needed moving to and from a bed to a chair (including a wheelchair)?: A Little Help needed standing up from a chair using your arms (e.g., wheelchair or bedside chair)?: A Little Help needed to walk in hospital room?: A Little Help needed climbing 3-5 steps with a railing? : A Lot 6 Click Score: 19    End of Session Equipment Utilized During Treatment: Gait belt Activity Tolerance: Patient tolerated treatment well Patient left: in bed;with call bell/phone within reach;with family/visitor present;Other (comment)(tech entering room to perform echo) Nurse Communication: Mobility status PT Visit Diagnosis: Other abnormalities of gait and mobility (R26.89);Muscle weakness (generalized) (M62.81)    Time: 6387-5643 PT Time Calculation  (min) (ACUTE ONLY): 21 min   Charges:   PT Evaluation $PT Eval Moderate Complexity: 1 Mod          Sherie Don, PT, DPT  Acute Rehabilitation Services Pager [phone] Office [place] 02/25/2018, 10:20 AM

## 2018-02-25 NOTE — Progress Notes (Signed)
  Echocardiogram 2D Echocardiogram has been performed.  Macon Sandiford T Henritta Mutz 02/25/2018, 8:48 AM

## 2018-02-25 NOTE — Progress Notes (Signed)
[facility] PROGRESS NOTE  Rachael [name] KGU:[id number] DOB: 24-Apr-1933 DOA: 02/24/2018 PCP: [name], MD  Assessment/Plan: #1  Acute metabolic encephalopathy/TIA:  resolved this am. presents with hypertensive urgency. Patient has risk factors for CVA including remote history of carotid stenosis, elevated blood pressure hypothyroidism. Evaluated by neurology who opine TGA vs hypertensive urgency and less likely stroke. MRI with no acute abnormality. Echo with EF 60% no regional wall abnormalities. PT evaluated no recommendations -follow EEG -appreciate neuro assistance  #2 hypertension: No prior history of hypertension but blood pressure elevated. On presentation. Today good control.  -monitor  #3 hypothyroidism: TRH 1.2. Replete with levothyroxine.  #4 history of breast cancer: Stable now under treatment.  Continue follow-up with her oncologist.  #5 anxiety disorder: Continue close monitoring.  #6 headache: resolved this am.  Patient may have complex migraine leading to her current symptoms.  No obvious cause on MRI.  Will treat symptomatically.  #7. Bradycardia. TSH as noted above. No home medications. HR increased with ambulation.  -monitor    Code Status: full Family Communication: daughter at bedside Disposition Plan: home in am   Consultants:  aror neurology  Procedures:  Echo  EEG pending  Antibiotics:    HPI/Subjective: Rachael [name] is a 85 y.o. female past medical history of carotid stenosis, chronic venous insufficiency, hyperlipidemia, admitted for acute encephalopathy. She drove back from [employer] into her driveway without a problem and then was found altered. She recalls not being able to get out of car. Two hours later daughter arrived and found her in car conscous disoriented repeating herself. When EMS arrived, she had difficulty naming objects and difficulty following commands.  She would not keep her left arm raised against gravity.     This am symptoms improved. Denies pain/discomfort but having difficulty remembering names of children  Objective: Vitals:   02/25/18 0715 02/25/18 1210  BP: (!) 121/55 (!) 126/52  Pulse: (!) 49 (!) 51  Resp: 20 18  Temp: 99.2 F (37.3 C) 97.6 F (36.4 C)  SpO2: 96% 97%    Intake/Output Summary (Last 24 hours) at 02/25/2018 1501 Last data filed at 02/25/2018 0300 Gross per 24 hour  Intake 115 ml  Output -  Net 115 ml   Filed Weights   02/24/18 2000  Weight: 86.5 kg    Exam:   General:  Awake alert oriented to self and place  Cardiovascular: rrr no mgr no LE edema  Respiratory: normal effort BS clear bilaterally no wheeze  Abdomen: obese soft +BS no guarding or rebounding  Musculoskeletal: joints without swelling/erythema   Data Reviewed: Basic Metabolic Panel: Recent Labs  Lab 02/24/18 2043  NA 142  K 4.0  CL 107  CO2 22  GLUCOSE 110*  BUN 28*  CREATININE 1.13*  CALCIUM 10.2   Liver Function Tests: Recent Labs  Lab 02/24/18 2043  AST 24  ALT 12  ALKPHOS 71  BILITOT 1.2  PROT 7.0  ALBUMIN 4.2   No results for input(s): LIPASE, AMYLASE in the last 168 hours. Recent Labs  Lab 02/24/18 2213  AMMONIA 23   CBC: Recent Labs  Lab 02/24/18 2043  WBC 14.1*  NEUTROABS 10.8*  HGB 14.0  HCT 43.8  MCV 88.5  PLT 338   Cardiac Enzymes: No results for input(s): CKTOTAL, CKMB, CKMBINDEX, TROPONINI in the last 168 hours. BNP (last 3 results) No results for input(s): BNP in the last 8760 hours.  ProBNP (last 3 results) No results for input(s): PROBNP in  the last 8760 hours.  CBG: No results for input(s): GLUCAP in the last 168 hours.  Recent Results (from the past 240 hour(s))  Blood culture (routine x 2)     Status: None (Preliminary result)   Collection Time: 02/24/18  9:15 PM  Result Value Ref Range Status   Specimen Description BLOOD RIGHT ANTECUBITAL  Final   Special Requests   Final    BOTTLES DRAWN AEROBIC AND ANAEROBIC Blood  Culture adequate volume   Culture   Final    NO GROWTH < 24 HOURS Performed at [facility], [address], [place], [place] [address]    Report Status PENDING  Incomplete     Studies: Mr Brain Wo Contrast  Result Date: 02/24/2018 CLINICAL DATA:  Altered mental status. Slurred speech and left-sided weakness. EXAM: MRI HEAD WITHOUT CONTRAST TECHNIQUE: Multiplanar, multiecho pulse sequences of the brain and surrounding structures were obtained without intravenous contrast. COMPARISON:  Head CT 02/24/2018 and MRI 03/19/2015 FINDINGS: Brain: There is no evidence of acute infarct, intracranial hemorrhage, mass, midline shift, or extra-axial fluid collection. Mild cerebral atrophy is within normal limits for age. There is a small chronic cortical infarct in the left parietal lobe which is new from the prior MRI. Scattered small foci of T2 hyperintensity in the cerebral white matter bilaterally are similar to the prior MRI and nonspecific but compatible with minimal chronic small vessel ischemic disease, not greater than expected for age. Vascular: Major intracranial vascular flow voids are preserved. Skull and upper cervical spine: Unremarkable bone marrow signal. Sinuses/Orbits: Unremarkable orbits. Posterior right ethmoid air cell opacification. Clear mastoid air cells. Other: None. IMPRESSION: 1. No acute intracranial abnormality. 2. Small chronic left parietal infarct, new from 2017. Electronically Signed   By: Logan Bores M.D.   On: 02/24/2018 21:35   Dg Chest Portable 1 View  Result Date: 02/24/2018 CLINICAL DATA:  Code stroke. EXAM: PORTABLE CHEST 1 VIEW COMPARISON:  Chest radiographs 03/17/2015 and CT 03/18/2015 FINDINGS: The cardiac silhouette is mildly enlarged. Aortic atherosclerosis is noted. Mild prominence of the interstitial markings is chronic in appearance. Minimal left basilar opacity may reflect atelectasis or scarring. There is slight chronic blunting of the left lateral  costophrenic angle without evidence of a sizable pleural effusion. No segmental consolidation, overt pulmonary edema, pneumothorax is identified. Right axillary surgical clips are noted. No acute osseous abnormality is seen. IMPRESSION: Minimal left basilar atelectasis or scarring. Electronically Signed   By: Logan Bores M.D.   On: 02/24/2018 21:19   Ct Head Code Stroke Wo Contrast  Result Date: 02/24/2018 CLINICAL DATA:  Code stroke. Slurred speech and left-sided weakness. EXAM: CT HEAD WITHOUT CONTRAST TECHNIQUE: Contiguous axial images were obtained from the base of the skull through the vertex without intravenous contrast. COMPARISON:  Brain MRI 03/19/2015 FINDINGS: Brain: There is no evidence of acute infarct, intracranial hemorrhage, mass, midline shift, or extra-axial fluid collection. Mild cerebral atrophy is not greater than expected for age. Vascular: Calcified atherosclerosis at the skull base. No hyperdense vessel. Skull: No fracture or focal osseous lesion. Sinuses/Orbits: Right posterior ethmoid air cell opacification. Clear mastoid air cells. Unremarkable orbits. Other: None. ASPECTS [facility] Stroke Program Early CT Score) - Ganglionic level infarction (caudate, lentiform nuclei, internal capsule, insula, M1-M3 cortex): 7 - Supraganglionic infarction (M4-M6 cortex): 3 Total score (0-10 with 10 being normal): 10 IMPRESSION: 1. No evidence of acute intracranial abnormality. 2. ASPECTS is 10. These results were communicated to Dr. Rory Percy at 8:55 pm on 02/24/2018 by  text page via the [facility] messaging system. Electronically Signed   By: Logan Bores M.D.   On: 02/24/2018 20:56    Scheduled Meds: . aspirin EC  81 mg Oral Daily  . atorvastatin  40 mg Oral q1800  . cholecalciferol  1,000 Units Oral Daily  . clopidogrel  75 mg Oral Daily  . enoxaparin (LOVENOX) injection  40 mg Subcutaneous Q24H  . levothyroxine  112 mcg Oral QAC breakfast  . sodium chloride flush  3 mL Intravenous Once    Continuous Infusions: . sodium chloride 75 mL/hr at 02/25/18 0128    Principal Problem:   Acute encephalopathy Active Problems:   CKD (chronic kidney disease), stage III (HCC)   TIA (transient ischemic attack)   Hypothyroidism   PERIPHERAL VASCULAR DISEASE   Carotid artery stenosis   Bradycardia   Anxiety state   Breast cancer, right breast ([place])    Time spent: 65    Sebring NP  [facility]  If 7PM-7AM, please contact night-coverage at [email or web address], password [facility] 02/25/2018, 3:01 PM  LOS: 0 days

## 2018-02-25 NOTE — Progress Notes (Signed)
OT Cancellation Note  Patient Details Name: Rachael [name] MRN: [id number] DOB: 1933/09/17   Cancelled Treatment:    Reason Eval/Treat Not Completed: Patient at procedure or test/ unavailable(ECHO). Will return as schedule allows. Thank you.  [place], OTR/L Acute Rehab Pager: [phone] Office: [phone] 02/25/2018, 8:46 AM

## 2018-02-25 NOTE — Progress Notes (Signed)
101/39 right arm

## 2018-02-26 ENCOUNTER — Observation Stay (HOSPITAL_BASED_OUTPATIENT_CLINIC_OR_DEPARTMENT_OTHER): Payer: PPO

## 2018-02-26 ENCOUNTER — Observation Stay (HOSPITAL_COMMUNITY): Payer: PPO

## 2018-02-26 DIAGNOSIS — G9340 Encephalopathy, unspecified: Secondary | ICD-10-CM | POA: Diagnosis not present

## 2018-02-26 DIAGNOSIS — E039 Hypothyroidism, unspecified: Secondary | ICD-10-CM | POA: Diagnosis not present

## 2018-02-26 LAB — BASIC METABOLIC PANEL
Anion gap: 7 (ref 5–15)
BUN: 21 mg/dL (ref 8–23)
CO2: 24 mmol/L (ref 22–32)
CREATININE: 0.89 mg/dL (ref 0.44–1.00)
Calcium: 9.3 mg/dL (ref 8.9–10.3)
Chloride: 108 mmol/L (ref 98–111)
GFR calc Af Amer: >60 mL/min (ref >60)
GFR calc non Af Amer: >60 mL/min (ref >60)
Glucose, Bld: 92 mg/dL (ref 70–99)
Potassium: 3.6 mmol/L (ref 3.5–5.1)
Sodium: 139 mmol/L (ref 135–145)

## 2018-02-26 LAB — CBC
HCT: 36 % (ref 36.0–46.0)
Hemoglobin: 11.4 g/dL — ABNORMAL LOW (ref 12.0–15.0)
MCH: 28.4 pg (ref 26.0–34.0)
MCHC: 31.7 g/dL (ref 30.0–36.0)
MCV: 89.6 fL (ref 80.0–100.0)
NRBC: 0 % (ref 0.0–0.2)
Platelets: 268 10*3/uL (ref 150–400)
RBC: 4.02 MIL/uL (ref 3.87–5.11)
RDW: 15.1 % (ref 11.5–15.5)
WBC: 7 10*3/uL (ref 4.0–10.5)

## 2018-02-26 LAB — CORTISOL-AM, BLOOD: Cortisol - AM: 15.2 ug/dL (ref 6.7–22.6)

## 2018-02-26 LAB — URINE CULTURE

## 2018-02-26 MED ORDER — ASPIRIN 81 MG PO TBEC: 81.0000 mg | DELAYED_RELEASE_TABLET | Freq: Every day | ORAL | Status: DC

## 2018-02-26 NOTE — Progress Notes (Signed)
Pt given discharge summary and was discharged via daughter's car as transportation.

## 2018-02-26 NOTE — Progress Notes (Signed)
EEG completed; results pending.    

## 2018-02-26 NOTE — Progress Notes (Signed)
VASCULAR LAB PRELIMINARY  PRELIMINARY  PRELIMINARY  PRELIMINARY  Carotid duplex completed.    Preliminary report:  See CV Proc  Bradie Lacock, RVT 02/26/2018, 8:26 AM
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
## 2018-02-26 NOTE — Care Management Obs Status (Signed)
[place] NOTIFICATION   Patient Details  Name: Rachael [name] MRN: [id number] Date of Birth: [birth year]   Medicare Observation Status Notification Given:  Yes    Carles Collet, RN 02/26/2018, 11:06 AM

## 2018-02-26 NOTE — Procedures (Signed)
History: 85 yo F being evaluated for transient mental state changes  Sedation: none  Technique: This is a 21 channel routine scalp EEG performed at the bedside with bipolar and monopolar montages arranged in accordance to the international 10/20 system of electrode placement. One channel was dedicated to EKG recording.    Background: The background consists of intermixed alpha and beta activities. There is a well defined posterior dominant rhythm of 8-9 Hz that attenuates with eye opening. There is increased delta associated with drowsiness. Sleep is recorded with normal appearing structures.   Photic stimulation: Physiologic driving is not performed  EEG Abnormalities: None  Clinical Interpretation: This normal EEG is recorded in the waking and sleep state. There was no seizure or seizure predisposition recorded on this study. Please note that lack of epileptiform activity on EEG does not preclude the possibility of epilepsy.   Roland Rack, MD [facility] [phone]  If 7pm- 7am, please page neurology on call as listed in [place].

## 2018-02-27 LAB — HEMOGLOBIN A1C
Hgb A1c MFr Bld: 5.1 % (ref 4.8–5.6)
Mean Plasma Glucose: 100 mg/dL

## 2018-03-01 LAB — CULTURE, BLOOD (ROUTINE X 2)
Culture: NO GROWTH
Special Requests: ADEQUATE

## 2018-03-02 ENCOUNTER — Telehealth: Payer: Self-pay | Admitting: *Deleted

## 2018-03-02 LAB — CULTURE, BLOOD (ROUTINE X 2)
Culture: NO GROWTH
Special Requests: ADEQUATE

## 2018-03-02 NOTE — Telephone Encounter (Signed)
Copied from [place] [id number]. Topic: General - Other >> Mar 01, 2018  5:23 PM Alanda Slim E wrote: Reason for CRM: Pt was seen in the hospital for dehydration and stress and passed out in her care and had a TGA. They wouldn't let her work out today until she got the ok from Dr. Sarajane Jews. Pt wants to know if she can work out on thursday/ please advise

## 2018-03-03 ENCOUNTER — Ambulatory Visit (INDEPENDENT_AMBULATORY_CARE_PROVIDER_SITE_OTHER): Payer: PPO | Admitting: Family Medicine

## 2018-03-03 ENCOUNTER — Telehealth: Payer: Self-pay | Admitting: *Deleted

## 2018-03-03 ENCOUNTER — Encounter: Payer: Self-pay | Admitting: Family Medicine

## 2018-03-03 VITALS — BP 128/62 | HR 56 | Temp 98.4°F | Wt 183.0 lb

## 2018-03-03 DIAGNOSIS — G459 Transient cerebral ischemic attack, unspecified: Secondary | ICD-10-CM

## 2018-03-03 DIAGNOSIS — F411 Generalized anxiety disorder: Secondary | ICD-10-CM

## 2018-03-03 DIAGNOSIS — N183 Chronic kidney disease, stage 3 unspecified: Secondary | ICD-10-CM

## 2018-03-03 DIAGNOSIS — Z8669 Personal history of other diseases of the nervous system and sense organs: Secondary | ICD-10-CM

## 2018-03-03 DIAGNOSIS — R29898 Other symptoms and signs involving the musculoskeletal system: Secondary | ICD-10-CM

## 2018-03-03 NOTE — Telephone Encounter (Signed)
Pt has an appt today at 2

## 2018-03-03 NOTE — Telephone Encounter (Signed)
Dr. Sarajane Jews please advise on when she is able to return to her work outs.  Thanks
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
## 2018-03-03 NOTE — Telephone Encounter (Signed)
Tell her NOT to work out until she sees me OV for a hospital follow up

## 2018-03-15 ENCOUNTER — Ambulatory Visit: Payer: Self-pay
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
## 2018-03-17 NOTE — Telephone Encounter (Signed)
Called and spoke with pt and she is aware of Dr. Fry's recs.  

## 2018-03-17 NOTE — Telephone Encounter (Signed)
Yes she can travel to [place], just observe proper precautions

## 2018-04-05 ENCOUNTER — Telehealth: Payer: Self-pay
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
## 2018-04-19 ENCOUNTER — Ambulatory Visit: Payer: PPO

## 2018-06-08 DIAGNOSIS — H25043 Posterior subcapsular polar age-related cataract, bilateral: Secondary | ICD-10-CM | POA: Diagnosis not present

## 2018-06-08 DIAGNOSIS — H18413 Arcus senilis, bilateral: Secondary | ICD-10-CM | POA: Diagnosis not present

## 2018-06-08 DIAGNOSIS — H2511 Age-related nuclear cataract, right eye: Secondary | ICD-10-CM | POA: Diagnosis not present

## 2018-06-08 DIAGNOSIS — H25013 Cortical age-related cataract, bilateral: Secondary | ICD-10-CM | POA: Diagnosis not present

## 2018-06-08 DIAGNOSIS — H2513 Age-related nuclear cataract, bilateral: Secondary | ICD-10-CM | POA: Diagnosis not present

## 2018-06-19 ENCOUNTER — Encounter: Payer: Self-pay | Admitting: Family Medicine

## 2018-06-19 ENCOUNTER — Other Ambulatory Visit: Payer: Self-pay

## 2018-06-19 ENCOUNTER — Ambulatory Visit (INDEPENDENT_AMBULATORY_CARE_PROVIDER_SITE_OTHER): Payer: PPO | Admitting: Family Medicine

## 2018-06-19 VITALS — BP 130/60 | HR 58 | Temp 98.9°F | Wt 184.0 lb

## 2018-06-19 DIAGNOSIS — L2389 Allergic contact dermatitis due to other agents: Secondary | ICD-10-CM | POA: Diagnosis not present

## 2018-06-19 MED ORDER — TRIAMCINOLONE ACETONIDE 0.1 % EX CREA: 1.0000 "application " | TOPICAL_CREAM | Freq: Three times a day (TID) | CUTANEOUS | 1 refills | Status: DC
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
## 2018-06-22 DIAGNOSIS — Z6828 Body mass index (BMI) 28.0-28.9, adult: Secondary | ICD-10-CM | POA: Diagnosis not present

## 2018-06-22 DIAGNOSIS — N3281 Overactive bladder: Secondary | ICD-10-CM | POA: Diagnosis not present

## 2018-06-22 DIAGNOSIS — Z124 Encounter for screening for malignant neoplasm of cervix: Secondary | ICD-10-CM | POA: Diagnosis not present

## 2018-06-23 ENCOUNTER — Telehealth: Payer: Self-pay

## 2018-06-23 NOTE — Telephone Encounter (Signed)
I am unaware of any need for f/u, as the test was normal.

## 2018-06-23 NOTE — Telephone Encounter (Signed)
LOV 11/18/17. Office note does not indicate need for follow up. Message routed to Dr. Loanne Drilling for clarification.

## 2018-07-20 DIAGNOSIS — D229 Melanocytic nevi, unspecified: Secondary | ICD-10-CM | POA: Diagnosis not present

## 2018-07-20 DIAGNOSIS — D1801 Hemangioma of skin and subcutaneous tissue: Secondary | ICD-10-CM | POA: Diagnosis not present

## 2018-07-20 DIAGNOSIS — L819 Disorder of pigmentation, unspecified: Secondary | ICD-10-CM | POA: Diagnosis not present

## 2018-07-20 DIAGNOSIS — L814 Other melanin hyperpigmentation: Secondary | ICD-10-CM | POA: Diagnosis not present

## 2018-07-20 DIAGNOSIS — L821 Other seborrheic keratosis: Secondary | ICD-10-CM | POA: Diagnosis not present

## 2018-08-24 ENCOUNTER — Ambulatory Visit: Payer: PPO

## 2018-08-30 DIAGNOSIS — E039 Hypothyroidism, unspecified: Secondary | ICD-10-CM | POA: Diagnosis not present

## 2018-08-30 DIAGNOSIS — R7309 Other abnormal glucose: Secondary | ICD-10-CM | POA: Diagnosis not present

## 2018-08-30 DIAGNOSIS — E063 Autoimmune thyroiditis: Secondary | ICD-10-CM | POA: Diagnosis not present

## 2018-08-30 DIAGNOSIS — E559 Vitamin D deficiency, unspecified: Secondary | ICD-10-CM | POA: Diagnosis not present

## 2018-09-05 DIAGNOSIS — E063 Autoimmune thyroiditis: Secondary | ICD-10-CM | POA: Diagnosis not present

## 2018-09-05 DIAGNOSIS — G729 Myopathy, unspecified: Secondary | ICD-10-CM | POA: Diagnosis not present

## 2018-09-05 DIAGNOSIS — R7309 Other abnormal glucose: Secondary | ICD-10-CM | POA: Diagnosis not present

## 2018-09-05 DIAGNOSIS — C50819 Malignant neoplasm of overlapping sites of unspecified female breast: Secondary | ICD-10-CM | POA: Diagnosis not present

## 2018-09-05 DIAGNOSIS — E039 Hypothyroidism, unspecified: Secondary | ICD-10-CM | POA: Diagnosis not present

## 2018-09-05 DIAGNOSIS — E559 Vitamin D deficiency, unspecified: Secondary | ICD-10-CM | POA: Diagnosis not present

## 2018-09-14 DIAGNOSIS — N958 Other specified menopausal and perimenopausal disorders: Secondary | ICD-10-CM | POA: Diagnosis not present

## 2018-09-29 ENCOUNTER — Telehealth: Payer: Self-pay

## 2018-09-29 NOTE — Telephone Encounter (Signed)
Copied from [place] [id number]. Topic: General - Other >> Sep 29, 2018 11:12 AM Leward Quan A wrote: Reason for CRM: Pateint called to request that Dr Sarajane Jews refer her out for a Thyroid Ultrasound scan. Asking for Dr Sarajane Jews to refer her to someone in [place]. Ph# [phone]

## 2018-09-29 NOTE — Telephone Encounter (Signed)
Please advise 

## 2018-10-02 NOTE — Telephone Encounter (Signed)
Spoke to pt and she stated that her Dr. Kelvin Cellar her up for this but she did not want to go where they were sending her. Pt stated she wants to have this done in [place]. Pt stated she will call after Wed. To schedule an appt. With Dr. Sarajane Jews after she comes back from out of town.

## 2018-10-02 NOTE — Telephone Encounter (Signed)
No I cannot order a test that another provider wants her to have (for a problem that I am not managing)

## 2018-10-02 NOTE — Telephone Encounter (Signed)
Patient notified of update  and verbalized understanding. 

## 2018-10-02 NOTE — Telephone Encounter (Signed)
Make her an OV to discuss this. I don't know why she wants this. She had been seeing Dr. Ala Dach for her thyroid management

## 2018-11-10 ENCOUNTER — Ambulatory Visit (INDEPENDENT_AMBULATORY_CARE_PROVIDER_SITE_OTHER): Payer: PPO | Admitting: Family Medicine

## 2018-11-10 ENCOUNTER — Other Ambulatory Visit: Payer: Self-pay

## 2018-11-10 ENCOUNTER — Encounter: Payer: Self-pay | Admitting: Family Medicine

## 2018-11-10 DIAGNOSIS — I8392 Asymptomatic varicose veins of left lower extremity: Secondary | ICD-10-CM

## 2018-11-10 DIAGNOSIS — E010 Iodine-deficiency related diffuse (endemic) goiter: Secondary | ICD-10-CM

## 2018-11-10 NOTE — Progress Notes (Signed)
   Subjective:    Patient ID: Rachael [name], female    DOB: Jun 06, 1933, 85 y.o.   MRN: [id number]  HPI Here for 2 reasons. First she noticed a firm lump in the left lower leg a week ago and she wants to know if this is a blood clot. It gets sore at times. No redness or swelling. She has a hx of varicose veins in the legs, and she has been seen at the [facility] in the past. Also she sees Dr. Ala Dach in [place] for her hypothyroidism. He wants her to have an US of the thyroid but her insurance will not pay for this unless it is done here in [place].    Review of Systems  Constitutional: Negative.   Respiratory: Negative.   Cardiovascular: Negative.        Objective:   Physical Exam Constitutional:      Appearance: Normal appearance. She is not ill-appearing.     Comments: Walks with a cane   Cardiovascular:     Rate and Rhythm: Normal rate and regular rhythm.     Pulses: Normal pulses.     Heart sounds: Normal heart sounds.  Pulmonary:     Effort: Pulmonary effort is normal.     Breath sounds: Normal breath sounds.  Musculoskeletal:     Comments: The left lower leg has several small lumps just under the skin which are mobile, slightly tender, and very firm. No edema   Neurological:     Mental Status: She is alert.           Assessment & Plan:  The lumps in her leg seem to be sclerosed varicose veins. I told her that they are unlikely to contain clots, but if they did these would not be dangerous and would not require treatment. I suggested she go back to the [facility] for an [place] to evaluate these further. As for the thyroid US, we will arrange to have this done at [place] and we will sent the results to Dr. Ala Dach. Alysia Penna, MD

## 2018-11-10 NOTE — Patient Instructions (Signed)
There are no preventive care reminders to display for this patient.  Depression screen [facility] 2/9 04/13/2017 05/20/2015 07/08/2014  Decreased Interest 0 0 0  Down, Depressed, Hopeless 0 0 0  PHQ - 2 Score 0 0 0  Some recent data might be hidden

## 2018-11-27 ENCOUNTER — Other Ambulatory Visit: Payer: Self-pay | Admitting: Obstetrics and Gynecology

## 2018-11-27 DIAGNOSIS — Z1231 Encounter for screening mammogram for malignant neoplasm of breast: Secondary | ICD-10-CM

## 2018-11-30 ENCOUNTER — Ambulatory Visit
Admission: RE | Admit: 2018-11-30 | Discharge: 2018-11-30 | Disposition: A | Discharge status: Home / Self Care | Payer: PPO | Source: Ambulatory Visit | Attending: Family Medicine | Admitting: Family Medicine

## 2018-11-30 DIAGNOSIS — E010 Iodine-deficiency related diffuse (endemic) goiter: Secondary | ICD-10-CM

## 2018-11-30 DIAGNOSIS — E049 Nontoxic goiter, unspecified: Secondary | ICD-10-CM | POA: Diagnosis not present

## 2018-12-27 ENCOUNTER — Ambulatory Visit (INDEPENDENT_AMBULATORY_CARE_PROVIDER_SITE_OTHER): Payer: PPO

## 2018-12-27 VITALS — Ht 68.0 in | Wt 184.0 lb

## 2018-12-27 DIAGNOSIS — Z1211 Encounter for screening for malignant neoplasm of colon: Secondary | ICD-10-CM | POA: Diagnosis not present

## 2018-12-27 DIAGNOSIS — Z13820 Encounter for screening for osteoporosis: Secondary | ICD-10-CM

## 2018-12-27 DIAGNOSIS — Z780 Asymptomatic menopausal state: Secondary | ICD-10-CM

## 2018-12-27 DIAGNOSIS — Z0000 Encounter for general adult medical examination without abnormal findings: Secondary | ICD-10-CM | POA: Diagnosis not present

## 2018-12-27 NOTE — Progress Notes (Signed)
This visit is being conducted via phone call due to the COVID-19 pandemic. This patient has given me verbal consent via phone to conduct this visit, patient states they are participating from their home address. Some vital signs may be absent or patient reported.   Patient identification: identified by name, DOB, and current address.  Location provider:  [facility], Office Persons participating in the virtual visit: Ms. Crystalyn Delia and Franne Forts, LPN.     Subjective:   Rachael [name] is a 85 y.o. female who presents for Medicare Annual (Subsequent) preventive examination.   Review of Systems:       Objective:    Vitals: Ht _0  (1.727 m)   Wt 184 lb (83.5 kg)   BMI 27.98 kg/m   Body mass index is 27.98 kg/m.  Advanced Directives 12/27/2018 04/13/2017 08/27/2016 04/21/2016 02/06/2016 10/22/2015 05/20/2015  Does Patient Have a Medical Advance Directive? _1  No Yes  Type of Paramedic of [place];Living will - [place];Living will [place];Living will [place];Living will - -  Does patient want to make changes to medical advance directive? No - Patient declined - - - No - Patient declined - -  Copy of [place] in Chart? No - copy requested - - Yes Yes - No - copy requested  Would patient like information on creating a medical advance directive? - - - - - - -    Tobacco Social History   Tobacco Use  Smoking Status Never Smoker  Smokeless Tobacco Never Used     Counseling given: Not Answered   Clinical Intake:  Pre-visit preparation completed: Yes  Pain : No/denies pain     Nutritional Status: BMI 25 -29 Overweight Diabetes: No  How often do you need to have someone help you when you read instructions, pamphlets, or other written materials from your doctor or pharmacy?: 1 - Never What is the last grade level you completed in school?: 1 year  college  Interpreter Needed?: No  Information entered by :: Franne Forts, LPN.  Past Medical History:  Diagnosis Date  . Breast cancer (HCC)    R  . CAROTID STENOSIS   . Chronic venous insufficiency   . CONTACT DERMATITIS   . DUPUYTREN'S CONTRACTURE   . Edema    pt  states had edema of feet and legs has been on lasix per dr. to help  . GLUCOSE INTOLERANCE   . HLD (hyperlipidemia)   . Hypothyroidism    Dr. Ala Dach in [place]   . MENOPAUSAL SYNDROME   . Obesity   . Osteoarthritis    bilat hips, severe  . PVD (peripheral vascular disease) ([place])    mild carotid 11/05  . VITAMIN D DEFICIENCY    Past Surgical History:  Procedure Laterality Date  . 2D echo  11/29/03   normal LV size. normal EF   . [place]   lumbarectomy  . BREAST BIOPSY  05/11/11   right breast  . BREAST LUMPECTOMY     right breast  . CHOLECYSTECTOMY    . FINGER SURGERY    . HERNIA REPAIR     incisional hernia  . INGUINAL HERNIA REPAIR    . JOINT REPLACEMENT Bilateral    Dr. Maureen Ralphs  . normal caronary arteries     per pt. 2003  . right rotator cuff surgery    . TONSILLECTOMY    . TOTAL  HIP ARTHROPLASTY  05/2008 and 10/2008   B hip - alusio   Family History  Problem Relation Age of Onset  . Uterine cancer Mother 27  . Cancer Mother   . Breast cancer Daughter 5       BRCA negative (no BART)  . Cancer Daughter   . Pancreatic cancer Maternal Grandfather        diagnosed in his 10s  . Prostate cancer Maternal Uncle        diagnosed in his 43s  . Colon cancer Cousin   . Prostate cancer Paternal Uncle        diagnosed in his 33s  . Clotting disorder Maternal Grandmother   . Leukemia Cousin   . Heart disease Father        before age 72  . Varicose Veins Father   . Heart disease Brother        before age 69  . Coronary artery disease Other        family hx - female 1st degree relative <60  . Hypothyroidism Other        aunt   . Uterine cancer Cousin   . Prostate  cancer Cousin   . Cancer Sister   . Anesthesia problems Neg Hx   . Adrenal disorder Neg Hx    Social History   Socioeconomic History  . Marital status: Divorced    Spouse name: Not on file  . Number of children: 5  . Years of education: 1 year college  . Highest education level: High school graduate  Occupational History  . Not on file  Tobacco Use  . Smoking status: Never Smoker  . Smokeless tobacco: Never Used  Substance and Sexual Activity  . Alcohol use: No    Alcohol/week: 0.0 standard drinks  . Drug use: No  . Sexual activity: Not Currently  Other Topics Concern  . Not on file  Social History Narrative   Divorced; works part time   5 children   Social Determinants of Radio broadcast assistant Strain:   . Difficulty of Paying Living Expenses: Not on file  Food Insecurity:   . Worried About Charity fundraiser in the Last Year: Not on file  . Ran Out of Food in the Last Year: Not on file  Transportation Needs:   . Lack of Transportation (Medical): Not on file  . Lack of Transportation (Non-Medical): Not on file  Physical Activity:   . Days of Exercise per Week: Not on file  . Minutes of Exercise per Session: Not on file  Stress:   . Feeling of Stress : Not on file  Social Connections:   . Frequency of Communication with Friends and Family: Not on file  . Frequency of Social Gatherings with Friends and Family: Not on file  . Attends Religious Services: Not on file  . Active Member of Clubs or Organizations: Not on file  . Attends Archivist Meetings: Not on file  . Marital Status: Not on file    Outpatient Encounter Medications as of 12/27/2018  Medication Sig  . aspirin EC 81 MG EC tablet Take 1 tablet (81 mg total) by mouth daily.  . Cholecalciferol (VITAMIN D3 PO) Take 1 tablet by mouth daily.  . Cyanocobalamin (B-12 PO) Take 1 tablet by mouth daily.   [place] levothyroxine (SYNTHROID, LEVOTHROID) 112 MCG tablet Take 1 tablet (112 mcg total) by  mouth daily before breakfast.  . OVER THE COUNTER MEDICATION Take 2  capsules by mouth every evening. Juice Plus - fruit  . OVER THE COUNTER MEDICATION Take 2 capsules by mouth every evening. Juice Plus - vegetables  . OVER THE COUNTER MEDICATION Take 2 capsules by mouth every evening. Juice Plus - berries  . OVER THE COUNTER MEDICATION Take 2 capsules by mouth every evening. Juice Plus - plant based omega  . traMADol (ULTRAM) 50 MG tablet TAKE ONE TABLET BY MOUTH EVERY SIX HOURS AS NEEDED FOR MODERATE PAIN (Patient not taking: No sig reported)  . triamcinolone cream (KENALOG) 0.1 % Apply 1 application topically 3 (three) times daily. (Patient not taking: Reported on 12/27/2018)   No facility-administered encounter medications on file as of 12/27/2018.    Activities of Daily Living In your present state of health, do you have any difficulty performing the following activities: 12/27/2018  Hearing? N  Vision? N  Difficulty concentrating or making decisions? N  Walking or climbing stairs? N  Dressing or bathing? N  Doing errands, shopping? N  Preparing Food and eating ? N  Using the Toilet? N  In the past six months, have you accidently leaked urine? N  Do you have problems with loss of bowel control? N  Managing your Medications? N  Managing your Finances? N  Housekeeping or managing your Housekeeping? N  Some recent data might be hidden    Patient Care Team: [name], MD as PCP - General (Family Medicine) Gaynelle Arabian, MD as Consulting Physician (Orthopedic Surgery) Armandina Gemma, MD as Consulting Physician (General Surgery) Dian Queen, MD (Obstetrics and Gynecology) Jovita Kussmaul, MD (General Surgery) Magrinat, Virgie Dad, MD (Hematology and Oncology) Valinda Party, MD as Referring Physician (Rheumatology)    Assessment:   This is a routine wellness examination for Rachael [name].  Exercise Activities and Dietary recommendations    Goals    . Exercise 150 min/wk  Moderate Activity     To be able to walk again Ca next month       Fall Risk Fall Risk  12/27/2018 04/13/2017 05/20/2015 07/08/2014 10/04/2012  Falls in the past year? 0 No No No No  Risk for fall due to : - - Impaired balance/gait - -   Is the patient's home free of loose throw rugs in walkways, pet beds, electrical cords, etc?   yes      Grab bars in the bathroom? yes      Handrails on the stairs?   yes      Adequate lighting?   yes  Timed Get Up and Go performed: N/A due to telephone visit  Depression Screen PHQ 2/9 Scores 12/27/2018 04/13/2017 05/20/2015 07/08/2014  PHQ - 2 Score 0 0 0 0     Cognitive Function MMSE - Mini Mental State Exam 04/13/2017  Not completed: (No Data)        Immunization History  Administered Date(s) Administered  . PPD Test 03/20/2015  . Td 02/11/1988, 11/14/2007, 02/22/2014  . Zoster Recombinat (Shingrix) 05/10/2017, 08/23/2017    Qualifies for Shingles Vaccine?Patient has already completed  Screening Tests Health Maintenance  Topic Date Due  . INFLUENZA VACCINE  01/10/2098 (Originally 08/12/2018)  . PNA vac Low Risk Adult (1 of 2 - PCV13) 01/10/2098 (Originally 12/10/2000)  . TETANUS/TDAP  02/23/2024  . DEXA SCAN  Completed    Cancer Screenings: Lung: Low Dose CT Chest recommended if Age 38-80 years, 30 pack-year currently smoking OR have quit w/in 15years. Patient does not qualify. Breast:  Up to date on Mammogram? Yes  Up to date of Bone Density/Dexa? Yes Colorectal: yes  Additional Screenings:  Hepatitis C Screening: N/A due to age     Plan:   Ms. Teagle will repeat a colonoscopy and DEXA scan. These orders have been placed and sent today. She declines influenza and pneumonia vaccines.    I have personally reviewed and noted the following in the patient's chart:   . Medical and social history . Use of alcohol, tobacco or illicit drugs  . Current medications and supplements . Functional ability and status . Nutritional  status . Physical activity . Advanced directives . List of other physicians . Hospitalizations, surgeries, and ER visits in previous 12 months . Vitals . Screenings to include cognitive, depression, and falls . Referrals and appointments  In addition, I have reviewed and discussed with patient certain preventive protocols, quality metrics, and best practice recommendations. A written personalized care plan for preventive services as well as general preventive health recommendations were provided to patient.     Franne Forts, LPN  [id number]

## 2018-12-27 NOTE — Patient Instructions (Addendum)
Rachael [name] , Thank you for taking time to participate in your Medicare Wellness Visit. I appreciate your ongoing commitment to your health goals. Please review the following plan we discussed and let me know if I can assist you in the future.   Screening recommendations/referrals: Colorectal Screening: performed 11/10/2007; [facility] will contact you to schedule.  Mammogram: completed 12/02/2017. Next appointment scheduled 01/19/2019.  Bone Density: completed 09/09/2016; due again. Order sent to the [facility] and they will contact you to schedule.   Vision and Dental Exams: Recommended annual ophthalmology exams for early detection of glaucoma and other disorders of the eye Recommended annual dental exams for proper oral hygiene  Diabetic Exams: Diabetic Eye Exam: N/A Diabetic Foot Exam: N/A  Vaccinations: Influenza vaccine: patient declines Pneumococcal vaccine: patient declines Tdap vaccine: completed 02/22/2014; due again 02/23/2024. Shingles vaccine: Completed 05/10/2017 and 08/23/2017.   Advanced directives: Advance directives discussed with you today. Please bring a copy of your POA (Power of [place]) and/or Living Will to your next appointment.  Goals: Recommend to drink at least 6-8 8oz glasses of water per day.  Recommend to remove any items from the home that may cause slips or trips. Recommend to decrease portion sizes by eating 3 small healthy meals and at least 2 healthy snacks per day.  Next appointment: Please schedule your Annual Wellness Visit with your Nurse Health Advisor in one year.  Preventive Care 83 Years and Older, Female Preventive care refers to lifestyle choices and visits with your health care provider that can promote health and wellness. What does preventive care include?  A yearly physical exam. This is also called an annual well check.  Dental exams once or twice a year.  Routine eye exams. Ask your health care  provider how often you should have your eyes checked.  Personal lifestyle choices, including:  Daily care of your teeth and gums.  Regular physical activity.  Eating a healthy diet.  Avoiding tobacco and drug use.  Limiting alcohol use.  Practicing safe sex.  Taking low-dose aspirin every day if recommended by your health care provider.  Taking vitamin and mineral supplements as recommended by your health care provider. What happens during an annual well check? The services and screenings done by your health care provider during your annual well check will depend on your age, overall health, lifestyle risk factors, and family history of disease. Counseling  Your health care provider may ask you questions about your:  Alcohol use.  Tobacco use.  Drug use.  Emotional well-being.  Home and relationship well-being.  Sexual activity.  Eating habits.  History of falls.  Memory and ability to understand (cognition).  Work and work Statistician.  Reproductive health. Screening  You may have the following tests or measurements:  Height, weight, and BMI.  Blood pressure.  Lipid and cholesterol levels. These may be checked every 5 years, or more frequently if you are over 61 years old.  Skin check.  Lung cancer screening. You may have this screening every year starting at age 4 if you have a 30-pack-year history of smoking and currently smoke or have quit within the past 15 years.  Fecal occult blood test (FOBT) of the stool. You may have this test every year starting at age 63.  Flexible sigmoidoscopy or colonoscopy. You may have a sigmoidoscopy every 5 years or a colonoscopy every 10 years starting at age 40.  Hepatitis C blood test.  Hepatitis B blood test.  Sexually transmitted disease (STD) testing.  Diabetes screening. This is done by checking your blood sugar (glucose) after you have not eaten for a while (fasting). You may have this done every 1-3  years.  Bone density scan. This is done to screen for osteoporosis. You may have this done starting at age 66.  Mammogram. This may be done every 1-2 years. Talk to your health care provider about how often you should have regular mammograms. Talk with your health care provider about your test results, treatment options, and if necessary, the need for more tests. Vaccines  Your health care provider may recommend certain vaccines, such as:  Influenza vaccine. This is recommended every year.  Tetanus, diphtheria, and acellular pertussis (Tdap, Td) vaccine. You may need a Td booster every 10 years.  Zoster vaccine. You may need this after age 85.  Pneumococcal 13-valent conjugate (PCV13) vaccine. One dose is recommended after age 69.  Pneumococcal polysaccharide (PPSV23) vaccine. One dose is recommended after age 38. Talk to your health care provider about which screenings and vaccines you need and how often you need them. This information is not intended to replace advice given to you by your health care provider. Make sure you discuss any questions you have with your health care provider. Document Released: 01/24/2015 Document Revised: 09/17/2015 Document Reviewed: 10/29/2014 Elsevier Interactive Patient Education  2017 [place] Prevention in the Home Falls can cause injuries. They can happen to people of all ages. There are many things you can do to make your home safe and to help prevent falls. What can I do on the outside of my home?  Regularly fix the edges of walkways and driveways and fix any cracks.  Remove anything that might make you trip as you walk through a door, such as a raised step or threshold.  Trim any bushes or trees on the path to your home.  Use bright outdoor lighting.  Clear any walking paths of anything that might make someone trip, such as rocks or tools.  Regularly check to see if handrails are loose or broken. Make sure that both sides of any  steps have handrails.  Any raised decks and porches should have guardrails on the edges.  Have any leaves, snow, or ice cleared regularly.  Use sand or salt on walking paths during winter.  Clean up any spills in your garage right away. This includes oil or grease spills. What can I do in the bathroom?  Use night lights.  Install grab bars by the toilet and in the tub and shower. Do not use towel bars as grab bars.  Use non-skid mats or decals in the tub or shower.  If you need to sit down in the shower, use a plastic, non-slip stool.  Keep the floor dry. Clean up any water that spills on the floor as soon as it happens.  Remove soap buildup in the tub or shower regularly.  Attach bath mats securely with double-sided non-slip rug tape.  Do not have throw rugs and other things on the floor that can make you trip. What can I do in the bedroom?  Use night lights.  Make sure that you have a light by your bed that is easy to reach.  Do not use any sheets or blankets that are too big for your bed. They should not hang down onto the floor.  Have a firm chair that has side arms. You can use this for support while you get  dressed.  Do not have throw rugs and other things on the floor that can make you trip. What can I do in the kitchen?  Clean up any spills right away.  Avoid walking on wet floors.  Keep items that you use a lot in easy-to-reach places.  If you need to reach something above you, use a strong step stool that has a grab bar.  Keep electrical cords out of the way.  Do not use floor polish or wax that makes floors slippery. If you must use wax, use non-skid floor wax.  Do not have throw rugs and other things on the floor that can make you trip. What can I do with my stairs?  Do not leave any items on the stairs.  Make sure that there are handrails on both sides of the stairs and use them. Fix handrails that are broken or loose. Make sure that handrails are  as long as the stairways.  Check any carpeting to make sure that it is firmly attached to the stairs. Fix any carpet that is loose or worn.  Avoid having throw rugs at the top or bottom of the stairs. If you do have throw rugs, attach them to the floor with carpet tape.  Make sure that you have a light switch at the top of the stairs and the bottom of the stairs. If you do not have them, ask someone to add them for you. What else can I do to help prevent falls?  Wear shoes that:  Do not have high heels.  Have rubber bottoms.  Are comfortable and fit you well.  Are closed at the toe. Do not wear sandals.  If you use a stepladder:  Make sure that it is fully opened. Do not climb a closed stepladder.  Make sure that both sides of the stepladder are locked into place.  Ask someone to hold it for you, if possible.  Clearly mark and make sure that you can see:  Any grab bars or handrails.  First and last steps.  Where the edge of each step is.  Use tools that help you move around (mobility aids) if they are needed. These include:  Canes.  Walkers.  Scooters.  Crutches.  Turn on the lights when you go into a dark area. Replace any light bulbs as soon as they burn out.  Set up your furniture so you have a clear path. Avoid moving your furniture around.  If any of your floors are uneven, fix them.  If there are any pets around you, be aware of where they are.  Review your medicines with your doctor. Some medicines can make you feel dizzy. This can increase your chance of falling. Ask your doctor what other things that you can do to help prevent falls. This information is not intended to replace advice given to you by your health care provider. Make sure you discuss any questions you have with your health care provider. Document Released: 10/24/2008 Document Revised: 06/05/2015 Document Reviewed: 02/01/2014 Elsevier Interactive Patient Education  2017 [employer].

## 2019-01-08 ENCOUNTER — Encounter: Payer: Self-pay | Admitting: Gastroenterology

## 2019-01-17 ENCOUNTER — Ambulatory Visit: Payer: Self-pay | Admitting: *Deleted

## 2019-01-17 NOTE — Telephone Encounter (Signed)
Patient noticed she feels dizzy when she goes from lying to a sitting position first thing in the mornings. This began Monday. Resolves quickly and she is able to continue to get up and walk without dizziness. Yesterday she stood up from sitting and noticed the lightheadedness without vertigo, it immediately resolved. No other symptoms. Denies CP/SOB/nausea/sweating/fever. Reviewed her daily fluid intake over the last several days.Care Advice including increasing fluid intake to 5 glasses minimum daily/rise up and change positions slowly. Offered appointment-patient declines stating she will increase her daily fluids and if no improvement she will call back. Reviewed urgent symptoms that require immediate evaluation Stated she understood.  Reason for Disposition . Dizziness caused by poor fluid intake  Answer Assessment - Initial Assessment Questions 1. DESCRIPTION: "Describe your dizziness."      2. LIGHTHEADED: "Do you feel lightheaded?" (e.g., somewhat faint, woozy, weak upon standing)     From lying to sitting first thing in the morning 3. VERTIGO: "Do you feel like either you or the room is spinning or tilting?" (i.e. vertigo)    May seem slightly like the room is moving.  4. SEVERITY: "How bad is it?"  "Do you feel like you are going to faint?" "Can you stand and walk?"   - MILD - walking normally   - MODERATE - interferes with normal activities (e.g., work, school)    - SEVERE - unable to stand, requires support to walk, feels like passing out now.      Mild. Goes about her daily activities 5. ONSET:  "When did the dizziness begin?"    Monday 6. AGGRAVATING FACTORS: "Does anything make it worse?" (e.g., standing, change in head position)     From lying to sitting and once yesterday going from sitting to standing. 7. HEART RATE: "Can you tell me your heart rate?" "How many beats in 15 seconds?"  (Note: not all patients can do this)       no 8. CAUSE: "What do you think is causing the  dizziness?"    unsure 9. RECURRENT SYMPTOM: "Have you had dizziness before?" If so, ask: "When was the last time?" "What happened that time?"     no 10. OTHER SYMPTOMS: "Do you have any other symptoms?" (e.g., fever, chest pain, vomiting, diarrhea, bleeding)       none 11. PREGNANCY: "Is there any chance you are pregnant?" "When was your last menstrual period?"       na  Protocols used: DIZZINESS [facility]

## 2019-01-19 ENCOUNTER — Ambulatory Visit: Payer: PPO

## 2019-02-01 ENCOUNTER — Encounter: Payer: PPO | Admitting: Gastroenterology

## 2019-02-14 ENCOUNTER — Other Ambulatory Visit: Payer: Self-pay

## 2019-02-14 ENCOUNTER — Telehealth: Payer: Self-pay | Admitting: Family Medicine

## 2019-02-14 MED ORDER — LEVOTHYROXINE SODIUM 112 MCG PO TABS: 112.0000 ug | ORAL_TABLET | Freq: Every day | ORAL | 3 refills | Status: DC

## 2019-02-14 NOTE — Telephone Encounter (Signed)
Rx sent to [facility]

## 2019-02-14 NOTE — Telephone Encounter (Signed)
pt is requesting a refill  for her levothyroxine (SYNTHROID, LEVOTHROID) 112 MCG tablet 90 day supply   [id number] - [place], [place]  Phone: [phone] Fax:  [phone]  contact  number  [phone]

## 2019-02-16 ENCOUNTER — Telehealth: Payer: Self-pay | Admitting: Family Medicine

## 2019-02-16 NOTE — Telephone Encounter (Signed)
Pt would like her thyroid results that she had done at [facility] on 11/30/2018. Thanks  Pt's # [phone]

## 2019-02-22 DIAGNOSIS — E018 Other iodine-deficiency related thyroid disorders and allied conditions: Secondary | ICD-10-CM | POA: Diagnosis not present

## 2019-02-22 DIAGNOSIS — R5382 Chronic fatigue, unspecified: Secondary | ICD-10-CM | POA: Diagnosis not present

## 2019-02-22 DIAGNOSIS — R5383 Other fatigue: Secondary | ICD-10-CM | POA: Diagnosis not present

## 2019-02-22 DIAGNOSIS — E559 Vitamin D deficiency, unspecified: Secondary | ICD-10-CM | POA: Diagnosis not present

## 2019-02-27 ENCOUNTER — Ambulatory Visit
Admission: RE | Admit: 2019-02-27 | Discharge: 2019-02-27 | Disposition: A | Discharge status: Home / Self Care | Payer: PPO | Source: Ambulatory Visit | Attending: Obstetrics and Gynecology | Admitting: Obstetrics and Gynecology

## 2019-02-27 ENCOUNTER — Other Ambulatory Visit: Payer: Self-pay

## 2019-02-27 DIAGNOSIS — Z1231 Encounter for screening mammogram for malignant neoplasm of breast: Secondary | ICD-10-CM

## 2019-03-06 DIAGNOSIS — E559 Vitamin D deficiency, unspecified: Secondary | ICD-10-CM | POA: Diagnosis not present

## 2019-03-06 DIAGNOSIS — R7309 Other abnormal glucose: Secondary | ICD-10-CM | POA: Diagnosis not present

## 2019-03-06 DIAGNOSIS — E039 Hypothyroidism, unspecified: Secondary | ICD-10-CM | POA: Diagnosis not present

## 2019-03-06 DIAGNOSIS — G729 Myopathy, unspecified: Secondary | ICD-10-CM | POA: Diagnosis not present

## 2019-03-06 DIAGNOSIS — E063 Autoimmune thyroiditis: Secondary | ICD-10-CM | POA: Diagnosis not present

## 2019-03-06 DIAGNOSIS — C50819 Malignant neoplasm of overlapping sites of unspecified female breast: Secondary | ICD-10-CM | POA: Diagnosis not present

## 2019-03-07 DIAGNOSIS — H524 Presbyopia: Secondary | ICD-10-CM | POA: Diagnosis not present

## 2019-03-07 DIAGNOSIS — H2513 Age-related nuclear cataract, bilateral: Secondary | ICD-10-CM | POA: Diagnosis not present

## 2019-03-19 ENCOUNTER — Telehealth: Payer: Self-pay | Admitting: Family Medicine

## 2019-03-19 NOTE — Telephone Encounter (Signed)
Pt knuckle is swollen red, painful and she can not close her hand. She would like advice for what she can do for it.   Patient Phone: [phone]

## 2019-03-20 NOTE — Telephone Encounter (Signed)
Spoke with the patient. She is aware of Dr. Fry's message below.  

## 2019-03-20 NOTE — Telephone Encounter (Signed)
Try ice and Ibuprofen. It this does not help, make an OV for me to check it

## 2019-04-05 DIAGNOSIS — H25811 Combined forms of age-related cataract, right eye: Secondary | ICD-10-CM | POA: Diagnosis not present

## 2019-04-05 DIAGNOSIS — H2511 Age-related nuclear cataract, right eye: Secondary | ICD-10-CM | POA: Diagnosis not present

## 2019-04-20 ENCOUNTER — Other Ambulatory Visit: Payer: Self-pay

## 2019-04-23 ENCOUNTER — Ambulatory Visit (INDEPENDENT_AMBULATORY_CARE_PROVIDER_SITE_OTHER): Payer: PPO | Admitting: Family Medicine

## 2019-04-23 ENCOUNTER — Encounter: Payer: Self-pay | Admitting: Family Medicine

## 2019-04-23 ENCOUNTER — Other Ambulatory Visit: Payer: Self-pay

## 2019-04-23 VITALS — BP 130/70 | HR 78 | Temp 97.8°F

## 2019-04-23 DIAGNOSIS — N3281 Overactive bladder: Secondary | ICD-10-CM

## 2019-04-23 MED ORDER — SOLIFENACIN SUCCINATE 10 MG PO TABS: 10.0000 mg | ORAL_TABLET | Freq: Every day | ORAL | 0 refills | Status: DC

## 2019-04-23 NOTE — Progress Notes (Signed)
   Subjective:    Patient ID: Rachael [name], female    DOB: Aug 04, 1933, 86 y.o.   MRN: [id number]  HPI Here for bladder issues. This has been going on for several years, but lately it has been worse. She has the sudden urge to urinate and sometimes she has an accident before she can get to the bathroom. There is no discomfort. This does not occur at night. She cannot produce a sample today.    Review of Systems  Constitutional: Negative.   Respiratory: Negative.   Cardiovascular: Negative.   Gastrointestinal: Negative.   Genitourinary: Positive for urgency. Negative for difficulty urinating, dysuria, flank pain, frequency and hematuria.       Objective:   Physical Exam Constitutional:      Appearance: Normal appearance.  Cardiovascular:     Rate and Rhythm: Normal rate and regular rhythm.     Pulses: Normal pulses.     Heart sounds: Normal heart sounds.  Pulmonary:     Effort: Pulmonary effort is normal.     Breath sounds: Normal breath sounds.  Abdominal:     General: Abdomen is flat. Bowel sounds are normal. There is no distension.     Palpations: Abdomen is soft. There is no mass.     Tenderness: There is no abdominal tenderness. There is no guarding or rebound.     Hernia: No hernia is present.  Neurological:     Mental Status: She is alert.           Assessment & Plan:  OAB, try Vesicare 10 mg daily. Refer to Urology.  Alysia Penna, MD

## 2019-04-30 DIAGNOSIS — H25812 Combined forms of age-related cataract, left eye: Secondary | ICD-10-CM | POA: Diagnosis not present

## 2019-04-30 DIAGNOSIS — H2512 Age-related nuclear cataract, left eye: Secondary | ICD-10-CM | POA: Diagnosis not present

## 2019-06-27 DIAGNOSIS — N3941 Urge incontinence: Secondary | ICD-10-CM | POA: Diagnosis not present

## 2019-06-27 DIAGNOSIS — R350 Frequency of micturition: Secondary | ICD-10-CM | POA: Diagnosis not present

## 2019-06-27 DIAGNOSIS — R3914 Feeling of incomplete bladder emptying: Secondary | ICD-10-CM | POA: Diagnosis not present

## 2019-06-27 DIAGNOSIS — R351 Nocturia: Secondary | ICD-10-CM | POA: Diagnosis not present

## 2019-07-02 ENCOUNTER — Other Ambulatory Visit: Payer: Self-pay

## 2019-07-02 ENCOUNTER — Ambulatory Visit (INDEPENDENT_AMBULATORY_CARE_PROVIDER_SITE_OTHER): Payer: PPO | Admitting: Family Medicine

## 2019-07-02 ENCOUNTER — Encounter: Payer: Self-pay | Admitting: Family Medicine

## 2019-07-02 VITALS — BP 140/70 | HR 67 | Temp 97.6°F

## 2019-07-02 DIAGNOSIS — L03032 Cellulitis of left toe: Secondary | ICD-10-CM | POA: Diagnosis not present

## 2019-07-02 DIAGNOSIS — E785 Hyperlipidemia, unspecified: Secondary | ICD-10-CM | POA: Diagnosis not present

## 2019-07-02 DIAGNOSIS — E039 Hypothyroidism, unspecified: Secondary | ICD-10-CM

## 2019-07-02 DIAGNOSIS — E739 Lactose intolerance, unspecified: Secondary | ICD-10-CM

## 2019-07-02 DIAGNOSIS — B351 Tinea unguium: Secondary | ICD-10-CM

## 2019-07-02 DIAGNOSIS — L600 Ingrowing nail: Secondary | ICD-10-CM

## 2019-07-02 LAB — T3, FREE: T3, Free: 3 pg/mL (ref 2.3–4.2)

## 2019-07-02 LAB — CBC WITH DIFFERENTIAL/PLATELET
Basophils Absolute: 0 10*3/uL (ref 0.0–0.1)
Basophils Relative: 0.5 % (ref 0.0–3.0)
Eosinophils Absolute: 0.2 10*3/uL (ref 0.0–0.7)
Eosinophils Relative: 2.8 % (ref 0.0–5.0)
HCT: 39.2 % (ref 36.0–46.0)
Hemoglobin: 13.2 g/dL (ref 12.0–15.0)
Lymphocytes Relative: 26.9 % (ref 12.0–46.0)
Lymphs Abs: 2.2 10*3/uL (ref 0.7–4.0)
MCHC: 33.6 g/dL (ref 30.0–36.0)
MCV: 88 fl (ref 78.0–100.0)
Monocytes Absolute: 0.9 10*3/uL (ref 0.1–1.0)
Monocytes Relative: 10.7 % (ref 3.0–12.0)
Neutro Abs: 4.8 10*3/uL (ref 1.4–7.7)
Neutrophils Relative %: 59.1 % (ref 43.0–77.0)
Platelets: 304 10*3/uL (ref 150.0–400.0)
RBC: 4.46 Mil/uL (ref 3.87–5.11)
RDW: 14.9 % (ref 11.5–15.5)
WBC: 8.1 10*3/uL (ref 4.0–10.5)

## 2019-07-02 LAB — HEMOGLOBIN A1C: Hgb A1c MFr Bld: 5.6 % (ref 4.6–6.5)

## 2019-07-02 LAB — T4, FREE: Free T4: 1.44 ng/dL (ref 0.60–1.60)

## 2019-07-02 LAB — HEPATIC FUNCTION PANEL
ALT: 13 U/L (ref 0–35)
AST: 20 U/L (ref 0–37)
Albumin: 4.1 g/dL (ref 3.5–5.2)
Alkaline Phosphatase: 66 U/L (ref 39–117)
Bilirubin, Direct: 0.2 mg/dL (ref 0.0–0.3)
Total Bilirubin: 1.1 mg/dL (ref 0.2–1.2)
Total Protein: 6.4 g/dL (ref 6.0–8.3)

## 2019-07-02 LAB — BASIC METABOLIC PANEL
BUN: 23 mg/dL (ref 6–23)
CO2: 25 mEq/L (ref 19–32)
Calcium: 9.5 mg/dL (ref 8.4–10.5)
Chloride: 108 mEq/L (ref 96–112)
Creatinine, Ser: 0.89 mg/dL (ref 0.40–1.20)
GFR: 60.49 mL/min (ref >60.00)
Glucose, Bld: 102 mg/dL — ABNORMAL HIGH (ref 70–99)
Potassium: 4 mEq/L (ref 3.5–5.1)
Sodium: 141 mEq/L (ref 135–145)

## 2019-07-02 LAB — LIPID PANEL
Cholesterol: 189 mg/dL (ref 0–200)
HDL: 69.4 mg/dL (ref >39.00)
LDL Cholesterol: 102 mg/dL — ABNORMAL HIGH (ref 0–99)
NonHDL: 119.29
Total CHOL/HDL Ratio: 3
Triglycerides: 86 mg/dL (ref 0.0–149.0)
VLDL: 17.2 mg/dL (ref 0.0–40.0)

## 2019-07-02 LAB — TSH: TSH: 0.56 u[IU]/mL (ref 0.35–4.50)

## 2019-07-02 MED ORDER — CEPHALEXIN 500 MG PO CAPS: 500.0000 mg | ORAL_CAPSULE | Freq: Three times a day (TID) | ORAL | 0 refills | Status: DC

## 2019-07-02 MED ORDER — TERBINAFINE HCL 250 MG PO TABS: 250.0000 mg | ORAL_TABLET | Freq: Every day | ORAL | 1 refills | Status: DC

## 2019-07-02 NOTE — Progress Notes (Signed)
   Subjective:    Patient ID: Rachael [name], female    DOB: 04-Mar-1933, 86 y.o.   MRN: [id number]  HPI Here for several issues. First she has had toenail fungus in most of her toenails for years, but they are getting so thick she has trouble trimming them. Also the great toenails have become more curved, and the left is beginning to dig into the skin along the edge on the left great toe. This has become painful and it is red. She also asks to see a specialist about her thyroid.    Review of Systems  Constitutional: Negative.   Respiratory: Negative.   Cardiovascular: Negative.        Objective:   Physical Exam Constitutional:      Appearance: Normal appearance.  Cardiovascular:     Rate and Rhythm: Normal rate and regular rhythm.     Pulses: Normal pulses.     Heart sounds: Normal heart sounds.  Pulmonary:     Effort: Pulmonary effort is normal.     Breath sounds: Normal breath sounds.  Skin:    Comments: Multiple toenails show fungal involvement and they are thick and curled. The left great toenail is ingrown on the lateral edge, and the skin is pink and tender   Neurological:     Mental Status: She is alert.           Assessment & Plan:  We will treat the onychomycosis with Terbinafine. Refer to Podiatry to trim the nails. Treat the paronychia with Keflex and warm soaks. Refer to Endocrine for the hypothyroidism. Alysia Penna, MD

## 2019-07-05 ENCOUNTER — Other Ambulatory Visit: Payer: Self-pay

## 2019-07-05 ENCOUNTER — Ambulatory Visit: Payer: PPO | Admitting: Podiatry

## 2019-07-05 ENCOUNTER — Encounter: Payer: Self-pay | Admitting: Podiatry

## 2019-07-05 DIAGNOSIS — L600 Ingrowing nail: Secondary | ICD-10-CM | POA: Diagnosis not present

## 2019-07-05 DIAGNOSIS — M79674 Pain in right toe(s): Secondary | ICD-10-CM

## 2019-07-05 DIAGNOSIS — B351 Tinea unguium: Secondary | ICD-10-CM

## 2019-07-05 DIAGNOSIS — M79675 Pain in left toe(s): Secondary | ICD-10-CM

## 2019-07-06 ENCOUNTER — Encounter: Payer: Self-pay | Admitting: Podiatry

## 2019-07-06 NOTE — Progress Notes (Signed)
  Subjective:  Patient ID: Rachael [name], female    DOB: 05/17/1933,  MRN: [id number]  Chief Complaint  Patient presents with  . Nail Problem    i have some thick toenails     86 y.o. female presents with the above complaint.  She was referred to [place] for this by her PCP, Dr. Sarajane Jews.  He is going to be treating her with terbinafine, and he is concerned that she is developing an ingrown toenail   Review of Systems: Negative except as noted in the HPI. Denies N/V/F/Ch.  Past Medical History:  Diagnosis Date  . Breast cancer (HCC)    R  . CAROTID STENOSIS   . Chronic venous insufficiency   . CONTACT DERMATITIS   . DUPUYTREN'S CONTRACTURE   . Edema    pt  states had edema of feet and legs has been on lasix per dr. to help  . GLUCOSE INTOLERANCE   . HLD (hyperlipidemia)   . Hypothyroidism    Dr. Ala Dach in [place]   . MENOPAUSAL SYNDROME   . Obesity   . Osteoarthritis    bilat hips, severe  . PVD (peripheral vascular disease) ([place])    mild carotid 11/05  . VITAMIN D DEFICIENCY     Current Outpatient Medications:  .  aspirin EC 81 MG EC tablet, Take 1 tablet (81 mg total) by mouth daily., Disp: , Rfl:  .  Cholecalciferol (VITAMIN D3 PO), Take 1 tablet by mouth daily., Disp: , Rfl:  .  Cyanocobalamin (B-12 PO), Take 1 tablet by mouth daily. , Disp: , Rfl:  .  levothyroxine (SYNTHROID) 112 MCG tablet, Take 1 tablet (112 mcg total) by mouth daily before breakfast., Disp: 90 tablet, Rfl: 3  Social History   Tobacco Use  Smoking Status Never Smoker  Smokeless Tobacco Never Used    Allergies  Allergen Reactions  . Shellfish Allergy Anaphylaxis and Hives    Hives all over the body. Hives all over the body  . Percocet [Oxycodone-Acetaminophen] Hives  . Iodinated Diagnostic Agents Other (See Comments)    PT IS NOT AWARE OF IODINE ALLERGY, PREMEDICATED FOR PRIOR PREMEDS ONLY//A.C. - unknown reaction  . Iodine Hives  . Prednisone Other (See Comments)    Muscle weakness      Objective:  There were no vitals filed for this visit. There is no height or weight on file to calculate BMI. Constitutional Well developed. Well nourished.  Vascular Dorsalis pedis pulses palpable bilaterally. Posterior tibial pulses palpable bilaterally. Capillary refill normal to all digits.  No cyanosis or clubbing noted. Pedal hair growth normal.  Neurologic Normal speech. Oriented to person, place, and time. Epicritic sensation to light touch grossly present bilaterally.  Dermatologic Nails elongated, thickened, discolored toenails x10. No open wounds. No skin lesions.  Orthopedic: Normal joint ROM without pain or crepitus bilaterally. No visible deformities. No bony tenderness.   Assessment:  No diagnosis found. Plan:  Patient was evaluated and treated and all questions answered.  Onychomycosis -Educated on etiology of nail fungus. -Nail sample taken for microbiology and histology. -Left great toenail was beginning to ingrow without paronychia present currently.  Offending border was removed in a slant back fashion without it with a sharp nail nipper  Lanae Crumbly, DPM 07/06/2019    Return in about 6 weeks (around 08/16/2019).

## 2019-07-10 NOTE — Addendum Note (Signed)
Addended by: Cranford Mon R on: 07/10/2019 08:08 AM   Modules accepted: Orders

## 2019-07-13 NOTE — Progress Notes (Signed)
Name: Rachael [name]  MRN/ DOB: [id number], 01/15/1933    Age/ Sex: 86 y.o., female    PCP: [name], MD   Reason for Endocrinology Evaluation: Hypothyroidism     Date of Initial Endocrinology Evaluation: 07/17/2019     HPI: Rachael [name] is a 86 y.o. female with a past medical history of Breast Ca and PMR. The patient presented for initial endocrinology clinic visit on 07/17/2019 for consultative assistance with her hypothyroidism.   She was diagnosed with hypothyroidism years ago. She has been on Levothyroxine since her diagnosis. She has been on 137 mcg but this was reduce to 112 mcg a year ago .    He main issue today is 40 lbs weight gain over the past 6 months. Pt denies changing her diet recently, she admits to limited physical ability due to leg/back issues, has not been able to go to the Gym due to [place].    Used to see Dr. Loanne Drilling, and saw another endocrinologist in [place], [place]   Started  liquid collagen twice a day for the past 7 months   She has been noted with elevated BP. No glucose abnormality  Bruises easily mainly on arms.  Denies fatigue but has chronic LE swelling     HISTORY:  Past Medical History:  Past Medical History:  Diagnosis Date   Breast cancer ([place])    R   CAROTID STENOSIS    Chronic venous insufficiency    CONTACT DERMATITIS    DUPUYTREN'S CONTRACTURE    Edema    pt  states had edema of feet and legs has been on lasix per dr. to help   GLUCOSE INTOLERANCE    HLD (hyperlipidemia)    Hypothyroidism    Dr. Ala Dach in [place]    Obesity    Osteoarthritis    bilat hips, severe   PVD (peripheral vascular disease) ([place])    mild carotid 11/05   VITAMIN D DEFICIENCY    Past Surgical History:  Past Surgical History:  Procedure Laterality Date   2D echo  11/29/03   normal LV size. normal EF    BACK SURGERY  1983   lumbarectomy   BREAST BIOPSY  05/11/11   right breast   BREAST  LUMPECTOMY     right breast   CHOLECYSTECTOMY     FINGER SURGERY     HERNIA REPAIR     incisional hernia   INGUINAL HERNIA REPAIR     JOINT REPLACEMENT Bilateral    Dr. Maureen Ralphs   normal caronary arteries     per pt. 2003   right rotator cuff surgery     TONSILLECTOMY     TOTAL HIP ARTHROPLASTY  05/2008 and 10/2008   B hip - alusio      Social History:  reports that she has never smoked. She has never used smokeless tobacco. She reports that she does not drink alcohol and does not use drugs.  Family History: family history includes Breast cancer (age of onset: 38) in her daughter; Cancer in her daughter, mother, and sister; Clotting disorder in her maternal grandmother; Colon cancer in her cousin; Coronary artery disease in an other family member; Heart disease in her brother and father; Hypothyroidism in an other family member; Leukemia in her cousin; Pancreatic cancer in her maternal grandfather; Prostate cancer in her cousin, maternal uncle, and paternal uncle; Uterine cancer in her cousin; Uterine cancer (age of onset: 46) in  her mother; Varicose Veins in her father.   HOME MEDICATIONS: Allergies as of 07/17/2019      Reactions   Shellfish Allergy Anaphylaxis, Hives   Hives all over the body. Hives all over the body   Percocet [oxycodone-acetaminophen] Hives   Iodinated Diagnostic Agents Other (See Comments)   PT IS NOT AWARE OF IODINE ALLERGY, PREMEDICATED FOR PRIOR PREMEDS ONLY//A.C. - unknown reaction   Iodine Hives   Prednisone Other (See Comments)   Muscle weakness       Medication List       Accurate as of July 17, 2019  3:29 PM. If you have any questions, ask your nurse or doctor.        aspirin 81 MG EC tablet Take 1 tablet (81 mg total) by mouth daily.   B-12 PO Take 1 tablet by mouth daily.   levothyroxine 112 MCG tablet Commonly known as: SYNTHROID Take 1 tablet (112 mcg total) by mouth daily before breakfast.   VITAMIN D3 PO Take 1 tablet by  mouth daily.         OBJECTIVE:  VS: BP 140/70 (BP Location: Left Arm)    Pulse 61    Ht 5\' 8"  (1.727 m)    Wt 224 lb 9.6 oz (101.9 kg)    SpO2 98%    BMI 34.15 kg/m    Wt Readings from Last 3 Encounters:  07/17/19 224 lb 9.6 oz (101.9 kg)  12/27/18 184 lb (83.5 kg)  06/19/18 184 lb (83.5 kg)     EXAM: General: Pt appears well and is in NAD  Neck: General: Supple without adenopathy. Thyroid: Thyroid size normal.   Lungs: Clear with good BS bilat with no rales, rhonchi, or wheezes  Heart: Auscultation: RRR.  Abdomen: Normoactive bowel sounds, soft, nontender, without masses or organomegaly palpable  Extremities:  BL LE:1+  pretibial edema   Skin: Hair: Texture and amount normal with gender appropriate distribution Skin Inspection: Ecchymotic lesions over the upper extremities   Neuro: Cranial nerves: II - XII grossly intact  DTRs: 2+ and symmetric in UE without delay in relaxation phase  Mental Status: Judgment, insight: Intact Orientation: Oriented to time, place, and person Mood and affect: No depression, anxiety, or agitation     DATA REVIEWED: Results for Rachael [name], Rachael [name] (MRN [id number]) as of 07/17/2019 14:56  Ref. Range 07/02/2019 12:15  TSH Latest Ref Range: 0.35 - 4.50 uIU/mL 0.56  Triiodothyronine,Free,Serum Latest Ref Range: 2.3 - 4.2 pg/mL 3.0  T4,Free(Direct) Latest Ref Range: 0.60 - 1.60 ng/dL 1.44     ASSESSMENT/PLAN/RECOMMENDATIONS:   1. Weight Gain:    - I have assured her that her thyroid is normal and its not contributing to weight gain - We discussed D/D of hereditary causes for weight gain ( pt weighed 232 lbs in 2016), decreased activity , fluid retention or cushing syndrome.  - Given unexplained weight loss, elevated BP , will proceed with 24-hr urine cortisol   2. Hypothyroidism :    - Pt is biochemically euthyroid  - She takes it appropriately     Medications : Continue Levothyroxine 112 mcg daily    F/U in 1 yr  Signed  electronically by: Mack Guise, MD  [facility]  [address] [place], [place] [address] Phone: [phone] FAX: [phone]   CC: [name], [place] [address] Phone: [phone] Fax: [phone]   Return to Endocrinology clinic as below: Future  Appointments  Date Time Provider [place]  08/16/2019  2:15 PM McDonald, Stephan Minister, DPM [facility]

## 2019-07-17 ENCOUNTER — Encounter: Payer: Self-pay | Admitting: Internal Medicine

## 2019-07-17 ENCOUNTER — Other Ambulatory Visit: Payer: Self-pay

## 2019-07-17 ENCOUNTER — Ambulatory Visit (INDEPENDENT_AMBULATORY_CARE_PROVIDER_SITE_OTHER): Payer: PPO | Admitting: Internal Medicine

## 2019-07-17 VITALS — BP 140/70 | HR 61 | Ht 68.0 in | Wt 224.6 lb

## 2019-07-17 DIAGNOSIS — E039 Hypothyroidism, unspecified: Secondary | ICD-10-CM

## 2019-07-17 DIAGNOSIS — R635 Abnormal weight gain: Secondary | ICD-10-CM | POA: Diagnosis not present

## 2019-07-17 NOTE — Patient Instructions (Signed)

## 2019-07-18 DIAGNOSIS — E039 Hypothyroidism, unspecified: Secondary | ICD-10-CM | POA: Insufficient documentation

## 2019-07-18 DIAGNOSIS — Z6833 Body mass index (BMI) 33.0-33.9, adult: Secondary | ICD-10-CM | POA: Diagnosis not present

## 2019-07-18 DIAGNOSIS — R635 Abnormal weight gain: Secondary | ICD-10-CM | POA: Insufficient documentation

## 2019-07-18 DIAGNOSIS — Z01419 Encounter for gynecological examination (general) (routine) without abnormal findings: Secondary | ICD-10-CM | POA: Diagnosis not present

## 2019-07-27 DIAGNOSIS — D485 Neoplasm of uncertain behavior of skin: Secondary | ICD-10-CM | POA: Diagnosis not present

## 2019-07-27 DIAGNOSIS — L814 Other melanin hyperpigmentation: Secondary | ICD-10-CM | POA: Diagnosis not present

## 2019-07-27 DIAGNOSIS — C44319 Basal cell carcinoma of skin of other parts of face: Secondary | ICD-10-CM | POA: Diagnosis not present

## 2019-07-27 DIAGNOSIS — Z85828 Personal history of other malignant neoplasm of skin: Secondary | ICD-10-CM | POA: Diagnosis not present

## 2019-07-27 DIAGNOSIS — D692 Other nonthrombocytopenic purpura: Secondary | ICD-10-CM | POA: Diagnosis not present

## 2019-07-27 DIAGNOSIS — L821 Other seborrheic keratosis: Secondary | ICD-10-CM | POA: Diagnosis not present

## 2019-08-02 DIAGNOSIS — R3914 Feeling of incomplete bladder emptying: Secondary | ICD-10-CM | POA: Diagnosis not present

## 2019-08-02 DIAGNOSIS — R351 Nocturia: Secondary | ICD-10-CM | POA: Diagnosis not present

## 2019-08-02 DIAGNOSIS — M62838 Other muscle spasm: Secondary | ICD-10-CM | POA: Diagnosis not present

## 2019-08-02 DIAGNOSIS — M6281 Muscle weakness (generalized): Secondary | ICD-10-CM | POA: Diagnosis not present

## 2019-08-07 DIAGNOSIS — H02831 Dermatochalasis of right upper eyelid: Secondary | ICD-10-CM | POA: Diagnosis not present

## 2019-08-07 DIAGNOSIS — H02132 Senile ectropion of right lower eyelid: Secondary | ICD-10-CM | POA: Diagnosis not present

## 2019-08-07 DIAGNOSIS — H0279 Other degenerative disorders of eyelid and periocular area: Secondary | ICD-10-CM | POA: Diagnosis not present

## 2019-08-07 DIAGNOSIS — H02135 Senile ectropion of left lower eyelid: Secondary | ICD-10-CM | POA: Diagnosis not present

## 2019-08-07 DIAGNOSIS — H02413 Mechanical ptosis of bilateral eyelids: Secondary | ICD-10-CM | POA: Diagnosis not present

## 2019-08-07 DIAGNOSIS — H53483 Generalized contraction of visual field, bilateral: Secondary | ICD-10-CM | POA: Diagnosis not present

## 2019-08-07 DIAGNOSIS — H57813 Brow ptosis, bilateral: Secondary | ICD-10-CM | POA: Diagnosis not present

## 2019-08-07 DIAGNOSIS — H02423 Myogenic ptosis of bilateral eyelids: Secondary | ICD-10-CM | POA: Diagnosis not present

## 2019-08-07 DIAGNOSIS — H02834 Dermatochalasis of left upper eyelid: Secondary | ICD-10-CM | POA: Diagnosis not present

## 2019-08-15 DIAGNOSIS — H53481 Generalized contraction of visual field, right eye: Secondary | ICD-10-CM | POA: Diagnosis not present

## 2019-08-15 DIAGNOSIS — H53483 Generalized contraction of visual field, bilateral: Secondary | ICD-10-CM | POA: Diagnosis not present

## 2019-08-15 DIAGNOSIS — H53482 Generalized contraction of visual field, left eye: Secondary | ICD-10-CM | POA: Diagnosis not present

## 2019-08-16 ENCOUNTER — Ambulatory Visit: Payer: PPO | Admitting: Podiatry

## 2019-09-21 DIAGNOSIS — E018 Other iodine-deficiency related thyroid disorders and allied conditions: Secondary | ICD-10-CM | POA: Diagnosis not present

## 2019-09-25 DIAGNOSIS — C50819 Malignant neoplasm of overlapping sites of unspecified female breast: Secondary | ICD-10-CM | POA: Diagnosis not present

## 2019-09-25 DIAGNOSIS — E063 Autoimmune thyroiditis: Secondary | ICD-10-CM | POA: Diagnosis not present

## 2019-09-25 DIAGNOSIS — E039 Hypothyroidism, unspecified: Secondary | ICD-10-CM | POA: Diagnosis not present

## 2019-09-25 DIAGNOSIS — G729 Myopathy, unspecified: Secondary | ICD-10-CM | POA: Diagnosis not present

## 2019-09-25 DIAGNOSIS — E559 Vitamin D deficiency, unspecified: Secondary | ICD-10-CM | POA: Diagnosis not present

## 2019-09-25 DIAGNOSIS — R7309 Other abnormal glucose: Secondary | ICD-10-CM | POA: Diagnosis not present

## 2019-10-11 ENCOUNTER — Telehealth: Payer: Self-pay | Admitting: Family Medicine

## 2019-10-11 NOTE — Telephone Encounter (Signed)
Please advise 

## 2019-10-11 NOTE — Telephone Encounter (Signed)
The patient wants Dr. Sarajane Jews to send in Ivermectin to the pharmacy just so she can have it on hand in case she needs it.  I advised the patient that she will probably have to schedule an appointment with Dr. Sarajane Jews before he will be able to prescribe this medication.  She wanted to make sure with Dr. Sarajane Jews first before scheduling an appointment.  Please advise

## 2019-10-12 NOTE — Telephone Encounter (Signed)
Patient called, left VM to return the call to the office. Will need to be made aware of Dr. Barbie Banner recommendation below.

## 2019-10-12 NOTE — Telephone Encounter (Signed)
NO I will not prescribe Ivermectin. There is NO scientific evidence that it helps and it can have serious side effects.

## 2019-10-18 NOTE — Telephone Encounter (Signed)
Pt called checking status of request for Ivermectin. Per instruction from RN & Dr. Sarajane Jews, informed pt provider will not prescribe Ivermectin. Pt verbalized understanding.

## 2019-11-01 ENCOUNTER — Ambulatory Visit: Payer: PPO | Admitting: Podiatrist

## 2019-11-03 DIAGNOSIS — L03115 Cellulitis of right lower limb: Secondary | ICD-10-CM | POA: Diagnosis not present

## 2019-11-05 ENCOUNTER — Other Ambulatory Visit: Payer: Self-pay

## 2019-11-05 ENCOUNTER — Encounter: Payer: Self-pay | Admitting: Family Medicine

## 2019-11-05 ENCOUNTER — Ambulatory Visit (INDEPENDENT_AMBULATORY_CARE_PROVIDER_SITE_OTHER): Payer: PPO | Admitting: Family Medicine

## 2019-11-05 VITALS — BP 140/70 | HR 55 | Temp 98.4°F | Ht 68.0 in | Wt 224.6 lb

## 2019-11-05 DIAGNOSIS — L03115 Cellulitis of right lower limb: Secondary | ICD-10-CM | POA: Diagnosis not present

## 2019-11-05 NOTE — Progress Notes (Signed)
   Subjective:    Patient ID: Rachael [name], female    DOB: [birth year], 86 y.o.   MRN: [id number]  HPI Here with her daughter to follow up an urgent visit to the [facility] after hours clinic on 11-03-19 for cellulitis in the right lower leg. On 10-30-19 she had a pedicure, and the next day she noticed redness and mild pain in the right foot. This then spread up the lower leg, so she went to the clinic. No fever. Xrays of the right lower leg and foot revealed no osteomyelitis. She was started on a 10 day course of Cephalexin 500 mg QID. Since then her leg and foot have improved dramatically. The swelling is down and the redness have almost disappeared. The pain is now only slight.    Review of Systems  Constitutional: Negative.   Respiratory: Negative.   Cardiovascular: Negative.   Musculoskeletal: Positive for myalgias.  Skin: Positive for color change.       Objective:   Physical Exam Constitutional:      General: She is not in acute distress.    Comments: In a wheelchair  Neurological:     Mental Status: She is alert.           Assessment & Plan:  She has a cellulitis which is responding well to Keflex. She will take the entire 10 day supply and then report back to me. If there are any lingering symptoms at that time we will extend the course of treatment.  Alysia Penna, MD

## 2019-11-13 DIAGNOSIS — H02831 Dermatochalasis of right upper eyelid: Secondary | ICD-10-CM | POA: Diagnosis not present

## 2019-11-13 DIAGNOSIS — H02834 Dermatochalasis of left upper eyelid: Secondary | ICD-10-CM | POA: Diagnosis not present

## 2019-11-14 ENCOUNTER — Ambulatory Visit (INDEPENDENT_AMBULATORY_CARE_PROVIDER_SITE_OTHER): Payer: PPO | Admitting: Family Medicine

## 2019-11-14 ENCOUNTER — Encounter: Payer: Self-pay | Admitting: Family Medicine

## 2019-11-14 ENCOUNTER — Other Ambulatory Visit: Payer: Self-pay

## 2019-11-14 VITALS — BP 120/62 | HR 52

## 2019-11-14 DIAGNOSIS — L03115 Cellulitis of right lower limb: Secondary | ICD-10-CM

## 2019-11-14 MED ORDER — DOXYCYCLINE HYCLATE 100 MG PO CAPS: 100.0000 mg | ORAL_CAPSULE | Freq: Two times a day (BID) | ORAL | 0 refills | Status: AC

## 2019-11-14 NOTE — Progress Notes (Signed)
   Subjective:    Patient ID: Rachael [name], female    DOB: 1933/11/01, 86 y.o.   MRN: [id number]  HPI Here with concerns about possible lingering infection in the legs. She was seen here on 11-05-19 with a cellulitis in the lower right leg, and she was given 10 days of Cephalexin. The leg seemed to be clearing up, but it never returned to what she thinks is normal. She finished the Cephalexin yesterday. She feels fine in general, no fever.    Review of Systems  Constitutional: Negative.   Respiratory: Negative.   Cardiovascular: Positive for leg swelling. Negative for chest pain and palpitations.  Skin: Positive for color change.       Objective:   Physical Exam Constitutional:      Appearance: Normal appearance. She is not ill-appearing.     Comments: Walks with a walker   Cardiovascular:     Rate and Rhythm: Normal rate and regular rhythm.     Pulses: Normal pulses.     Heart sounds: Normal heart sounds.  Pulmonary:     Effort: Pulmonary effort is normal.     Breath sounds: Normal breath sounds.  Musculoskeletal:     Comments: 2+ edema in both lower legs. The right lower leg is still faintly red and slightly warm. Not tender   Neurological:     Mental Status: She is alert.           Assessment & Plan:  Cellulitis, treat with 30 days of Doxycycline 100 mg BID. Recheck prn.  Alysia Penna, MD

## 2019-12-03 ENCOUNTER — Other Ambulatory Visit: Payer: Self-pay

## 2019-12-03 ENCOUNTER — Ambulatory Visit (INDEPENDENT_AMBULATORY_CARE_PROVIDER_SITE_OTHER): Payer: PPO | Admitting: Family Medicine

## 2019-12-03 ENCOUNTER — Encounter: Payer: Self-pay | Admitting: Family Medicine

## 2019-12-03 VITALS — BP 118/76 | HR 80 | Temp 98.9°F | Ht 68.0 in | Wt 224.0 lb

## 2019-12-03 DIAGNOSIS — B372 Candidiasis of skin and nail: Secondary | ICD-10-CM | POA: Diagnosis not present

## 2019-12-03 DIAGNOSIS — I872 Venous insufficiency (chronic) (peripheral): Secondary | ICD-10-CM

## 2019-12-03 DIAGNOSIS — R600 Localized edema: Secondary | ICD-10-CM

## 2019-12-03 DIAGNOSIS — L03115 Cellulitis of right lower limb: Secondary | ICD-10-CM

## 2019-12-03 MED ORDER — KETOCONAZOLE 2 % EX CREA: 1.0000 "application " | TOPICAL_CREAM | Freq: Every day | CUTANEOUS | 2 refills | Status: DC

## 2019-12-03 MED ORDER — FUROSEMIDE 40 MG PO TABS: 40.0000 mg | ORAL_TABLET | Freq: Every day | ORAL | 5 refills | Status: DC

## 2019-12-03 NOTE — Progress Notes (Signed)
   Subjective:    Patient ID: Rachael [name], female    DOB: 04-Mar-1933, 86 y.o.   MRN: [id number]  HPI Here with her daughter to follow up on leg edema and cellulitis in the right leg. She has almost finished the Doxycycline we had given her. She thought the infection in the right leg was going away, but now she is not sure. There is no pain in the leg. The redness has improved. She still struggles with swelling in both leg, and she sometimes has mild SOB. No cough or fever. She elevates the legs for several hours a day, and she wears support stockings most of the time. Also for the past week she has had a rash in in the lower left abdomen and is worried the infection in her leg has spread to her body. Of note she has recently been on Keflex and now Doxycycline.    Review of Systems  Constitutional: Positive for fatigue.  Respiratory: Positive for shortness of breath. Negative for cough, chest tightness and wheezing.   Cardiovascular: Positive for leg swelling. Negative for chest pain and palpitations.  Skin: Positive for color change.       Objective:   Physical Exam Constitutional:      Comments: In a wheelchair   Cardiovascular:     Rate and Rhythm: Normal rate and regular rhythm.     Pulses: Normal pulses.     Heart sounds: Normal heart sounds.  Pulmonary:     Effort: Pulmonary effort is normal. No respiratory distress.     Breath sounds: Normal breath sounds. No stridor. No wheezing or rhonchi.     Comments: Faint rales at both bases  Musculoskeletal:     Comments: 2+ edema in both lower legs. The right leg actually looks better. There is some blanchable erythema but not warmth or tenderness.   Skin:    Comments: The crease beneath the left lower abdomen fold has a macular red rash  Neurological:     Mental Status: She is alert.           Assessment & Plan:  For the leg edema, we will start her back on Lasix 40 mg daily. I also asked her to see Dr. Renaldo Reel again  soon to check the varicose veins in her legs (it has been 2 years since she saw him). Today there is no evidence of a cellulitis, so this has resolved. I asked her to finish out the last few days of Doxycycline. The abdominal rash is a Candidal rash so we will treat this with Ketoconazole cream.  Alysia Penna, MD

## 2019-12-11 ENCOUNTER — Telehealth: Payer: Self-pay | Admitting: Family Medicine

## 2019-12-11 MED ORDER — FLUCONAZOLE 150 MG PO TABS: ORAL_TABLET | ORAL | 0 refills | Status: DC

## 2019-12-11 NOTE — Telephone Encounter (Signed)
Patient is calling and stated that she was seen for a rash that still hasn't gotten any better and is requesting a call back, please advise. CB is [phone]

## 2019-12-11 NOTE — Telephone Encounter (Signed)
Continue the Ketoconazole cream but add Diflucan 150 mg daily for 10 days

## 2019-12-11 NOTE — Telephone Encounter (Signed)
Patient was informed of the message below and aware the Rx was sent to [facility].

## 2019-12-17 DIAGNOSIS — I83893 Varicose veins of bilateral lower extremities with other complications: Secondary | ICD-10-CM | POA: Diagnosis not present

## 2019-12-17 DIAGNOSIS — I8312 Varicose veins of left lower extremity with inflammation: Secondary | ICD-10-CM | POA: Diagnosis not present

## 2019-12-17 DIAGNOSIS — I8311 Varicose veins of right lower extremity with inflammation: Secondary | ICD-10-CM | POA: Diagnosis not present

## 2019-12-20 DIAGNOSIS — H02831 Dermatochalasis of right upper eyelid: Secondary | ICD-10-CM | POA: Diagnosis not present

## 2019-12-20 DIAGNOSIS — H02834 Dermatochalasis of left upper eyelid: Secondary | ICD-10-CM | POA: Diagnosis not present

## 2019-12-31 ENCOUNTER — Telehealth: Payer: Self-pay | Admitting: Family Medicine

## 2019-12-31 NOTE — Telephone Encounter (Signed)
Left message for patient to call back and schedule Medicare Annual Wellness Visit (AWV) either virtually or in office.   Last AWV 12/27/18  please schedule at anytime with [facility] Nurse Health Advisor 1 or 2   This should be a 45 minute visit.

## 2020-01-09 ENCOUNTER — Telehealth: Payer: Self-pay | Admitting: Family Medicine

## 2020-01-09 DIAGNOSIS — R918 Other nonspecific abnormal finding of lung field: Secondary | ICD-10-CM | POA: Diagnosis not present

## 2020-01-09 DIAGNOSIS — R0902 Hypoxemia: Secondary | ICD-10-CM | POA: Diagnosis not present

## 2020-01-09 DIAGNOSIS — U071 COVID-19: Secondary | ICD-10-CM | POA: Diagnosis not present

## 2020-01-09 DIAGNOSIS — Z79899 Other long term (current) drug therapy: Secondary | ICD-10-CM | POA: Diagnosis not present

## 2020-01-09 DIAGNOSIS — R4182 Altered mental status, unspecified: Secondary | ICD-10-CM | POA: Diagnosis not present

## 2020-01-09 DIAGNOSIS — Z79890 Hormone replacement therapy (postmenopausal): Secondary | ICD-10-CM | POA: Diagnosis not present

## 2020-01-09 DIAGNOSIS — R2689 Other abnormalities of gait and mobility: Secondary | ICD-10-CM | POA: Diagnosis not present

## 2020-01-09 DIAGNOSIS — R531 Weakness: Secondary | ICD-10-CM | POA: Diagnosis not present

## 2020-01-09 DIAGNOSIS — E039 Hypothyroidism, unspecified: Secondary | ICD-10-CM | POA: Diagnosis not present

## 2020-01-09 DIAGNOSIS — R410 Disorientation, unspecified: Secondary | ICD-10-CM | POA: Diagnosis not present

## 2020-01-09 DIAGNOSIS — R509 Fever, unspecified: Secondary | ICD-10-CM | POA: Diagnosis not present

## 2020-01-09 DIAGNOSIS — R7989 Other specified abnormal findings of blood chemistry: Secondary | ICD-10-CM | POA: Diagnosis not present

## 2020-01-09 NOTE — Telephone Encounter (Signed)
Patient son is calling and stated that patient has just tested positive for covid and wanted to see if she was a candidate for the monoclonal infusion, please advise. CB is [phone]

## 2020-01-09 NOTE — Telephone Encounter (Signed)
Please advise 

## 2020-01-10 DIAGNOSIS — R7989 Other specified abnormal findings of blood chemistry: Secondary | ICD-10-CM | POA: Diagnosis not present

## 2020-01-10 DIAGNOSIS — R531 Weakness: Secondary | ICD-10-CM | POA: Diagnosis not present

## 2020-01-10 DIAGNOSIS — U071 COVID-19: Secondary | ICD-10-CM | POA: Diagnosis not present

## 2020-01-10 DIAGNOSIS — R918 Other nonspecific abnormal finding of lung field: Secondary | ICD-10-CM | POA: Diagnosis not present

## 2020-01-10 DIAGNOSIS — Z79890 Hormone replacement therapy (postmenopausal): Secondary | ICD-10-CM | POA: Diagnosis not present

## 2020-01-10 DIAGNOSIS — R410 Disorientation, unspecified: Secondary | ICD-10-CM | POA: Diagnosis not present

## 2020-01-10 DIAGNOSIS — Z79899 Other long term (current) drug therapy: Secondary | ICD-10-CM | POA: Diagnosis not present

## 2020-01-10 DIAGNOSIS — R001 Bradycardia, unspecified: Secondary | ICD-10-CM | POA: Diagnosis not present

## 2020-01-10 DIAGNOSIS — E039 Hypothyroidism, unspecified: Secondary | ICD-10-CM | POA: Diagnosis not present

## 2020-01-11 DIAGNOSIS — R079 Chest pain, unspecified: Secondary | ICD-10-CM | POA: Diagnosis not present

## 2020-01-11 DIAGNOSIS — E039 Hypothyroidism, unspecified: Secondary | ICD-10-CM | POA: Diagnosis not present

## 2020-01-11 DIAGNOSIS — R4182 Altered mental status, unspecified: Secondary | ICD-10-CM | POA: Diagnosis not present

## 2020-01-11 DIAGNOSIS — R001 Bradycardia, unspecified: Secondary | ICD-10-CM | POA: Diagnosis not present

## 2020-01-11 DIAGNOSIS — U071 COVID-19: Secondary | ICD-10-CM | POA: Diagnosis not present

## 2020-01-14 DIAGNOSIS — R001 Bradycardia, unspecified: Secondary | ICD-10-CM | POA: Diagnosis not present

## 2020-01-15 NOTE — Telephone Encounter (Signed)
Right now there is a shortage of infusion materials so I would advise this only for the sickest patients

## 2020-01-16 NOTE — Telephone Encounter (Signed)
Left a message for the pt to return my call.  

## 2020-01-16 NOTE — Telephone Encounter (Signed)
Patient informed of the message below and stated she is feeling better at this time.  Message sent to PCP.

## 2020-01-17 ENCOUNTER — Ambulatory Visit: Payer: PPO

## 2020-01-18 DIAGNOSIS — M1990 Unspecified osteoarthritis, unspecified site: Secondary | ICD-10-CM | POA: Diagnosis not present

## 2020-01-18 DIAGNOSIS — E039 Hypothyroidism, unspecified: Secondary | ICD-10-CM | POA: Diagnosis not present

## 2020-01-18 DIAGNOSIS — J1282 Pneumonia due to coronavirus disease 2019: Secondary | ICD-10-CM | POA: Diagnosis not present

## 2020-01-18 DIAGNOSIS — M4800 Spinal stenosis, site unspecified: Secondary | ICD-10-CM | POA: Diagnosis not present

## 2020-01-18 DIAGNOSIS — Z9181 History of falling: Secondary | ICD-10-CM | POA: Diagnosis not present

## 2020-01-18 DIAGNOSIS — U071 COVID-19: Secondary | ICD-10-CM | POA: Diagnosis not present

## 2020-01-18 DIAGNOSIS — Z87440 Personal history of urinary (tract) infections: Secondary | ICD-10-CM | POA: Diagnosis not present

## 2020-01-18 DIAGNOSIS — Z96649 Presence of unspecified artificial hip joint: Secondary | ICD-10-CM | POA: Diagnosis not present

## 2020-01-19 DIAGNOSIS — R410 Disorientation, unspecified: Secondary | ICD-10-CM | POA: Diagnosis not present

## 2020-01-19 DIAGNOSIS — Z79899 Other long term (current) drug therapy: Secondary | ICD-10-CM | POA: Diagnosis not present

## 2020-01-19 DIAGNOSIS — Z8616 Personal history of COVID-19: Secondary | ICD-10-CM | POA: Diagnosis not present

## 2020-01-19 DIAGNOSIS — R4182 Altered mental status, unspecified: Secondary | ICD-10-CM | POA: Diagnosis not present

## 2020-01-19 DIAGNOSIS — E079 Disorder of thyroid, unspecified: Secondary | ICD-10-CM | POA: Diagnosis not present

## 2020-01-19 DIAGNOSIS — L03113 Cellulitis of right upper limb: Secondary | ICD-10-CM | POA: Diagnosis not present

## 2020-01-19 DIAGNOSIS — L03011 Cellulitis of right finger: Secondary | ICD-10-CM | POA: Diagnosis not present

## 2020-01-23 ENCOUNTER — Ambulatory Visit (INDEPENDENT_AMBULATORY_CARE_PROVIDER_SITE_OTHER): Payer: PPO | Admitting: Family Medicine

## 2020-01-23 ENCOUNTER — Other Ambulatory Visit: Payer: Self-pay

## 2020-01-23 ENCOUNTER — Encounter: Payer: Self-pay | Admitting: Family Medicine

## 2020-01-23 VITALS — BP 110/50 | HR 93 | Temp 98.8°F

## 2020-01-23 DIAGNOSIS — U071 COVID-19: Secondary | ICD-10-CM

## 2020-01-23 DIAGNOSIS — N390 Urinary tract infection, site not specified: Secondary | ICD-10-CM | POA: Diagnosis not present

## 2020-01-23 DIAGNOSIS — L03113 Cellulitis of right upper limb: Secondary | ICD-10-CM

## 2020-01-23 NOTE — Progress Notes (Signed)
   Subjective:    Patient ID: Rachael [name], female    DOB: [birth year], 87 y.o.   MRN: [id number]  HPI Here with her daughter follow up a hospital stay from 01-09-20 to 01-11-20 for a Covid infection. She presented with a cough, SOB, weakness, and mental status changes. She is not vaccinated against the Covid virus. Her CXR revealed scattered bilateral infiltrates. She declined treatment with Remdesivir or steroids, so she was given IV fluids and oxygen. She was also found to have a UTI with Enterococcus and Klebsiella, so this was treated with Augmentin. She improved, so she was DC'd to live with her daughter for awhile. Then she developed a cellulitis in the right hand, no doubt from an IV site, and she was seen at the ER on 01-19-20. She was given Keflex and Bactrim DS for this, and she will finish these up in 2 days. The hand looks and feels much better, though there is still some slight pain. No fever. Her appetite is good and she is drinking fluids. She will begin PT next week. Her daughter says her confusion has resolved.    Review of Systems  Constitutional: Positive for fatigue. Negative for chills, diaphoresis and fever.  Respiratory: Negative.   Cardiovascular: Negative.   Gastrointestinal: Negative.        Objective:   Physical Exam Constitutional:      Appearance: Normal appearance.     Comments: In a wheelchair   Cardiovascular:     Rate and Rhythm: Normal rate and regular rhythm.     Pulses: Normal pulses.     Heart sounds: Normal heart sounds.  Pulmonary:     Effort: Pulmonary effort is normal.     Breath sounds: Normal breath sounds.  Skin:    Comments: The 3rd finger on the right hand is slightly swollen and tender, no erythema or warmth   Neurological:     General: No focal deficit present.     Mental Status: She is alert and oriented to person, place, and time. Mental status is at baseline.           Assessment & Plan:  She is recovering from a Covid  pneumonia, from a UTI, and from cellulitis in the right hand. She is back to baseline as far as her breathing and her cognition. She will finish out the antibiotics. She will start PT next week. Recheck prn. Alysia Penna, MD

## 2020-01-25 DIAGNOSIS — U071 COVID-19: Secondary | ICD-10-CM | POA: Diagnosis not present

## 2020-01-25 DIAGNOSIS — E039 Hypothyroidism, unspecified: Secondary | ICD-10-CM | POA: Diagnosis not present

## 2020-01-25 DIAGNOSIS — Z96649 Presence of unspecified artificial hip joint: Secondary | ICD-10-CM | POA: Diagnosis not present

## 2020-01-25 DIAGNOSIS — M4800 Spinal stenosis, site unspecified: Secondary | ICD-10-CM | POA: Diagnosis not present

## 2020-01-25 DIAGNOSIS — Z87440 Personal history of urinary (tract) infections: Secondary | ICD-10-CM | POA: Diagnosis not present

## 2020-01-25 DIAGNOSIS — J1282 Pneumonia due to coronavirus disease 2019: Secondary | ICD-10-CM | POA: Diagnosis not present

## 2020-01-25 DIAGNOSIS — Z9181 History of falling: Secondary | ICD-10-CM | POA: Diagnosis not present

## 2020-01-25 DIAGNOSIS — M1990 Unspecified osteoarthritis, unspecified site: Secondary | ICD-10-CM | POA: Diagnosis not present

## 2020-01-28 ENCOUNTER — Ambulatory Visit: Payer: PPO | Admitting: Family Medicine

## 2020-01-29 ENCOUNTER — Ambulatory Visit: Payer: PPO | Admitting: Family Medicine

## 2020-01-30 ENCOUNTER — Ambulatory Visit: Admission: EM | Admit: 2020-01-30 | Discharge: 2020-01-30 | Discharge status: Against Medical Advice | Payer: PPO

## 2020-01-30 ENCOUNTER — Other Ambulatory Visit: Payer: Self-pay

## 2020-02-05 ENCOUNTER — Other Ambulatory Visit: Payer: Self-pay

## 2020-02-05 ENCOUNTER — Encounter: Payer: Self-pay | Admitting: Family Medicine

## 2020-02-05 ENCOUNTER — Ambulatory Visit (INDEPENDENT_AMBULATORY_CARE_PROVIDER_SITE_OTHER): Payer: PPO | Admitting: Family Medicine

## 2020-02-05 VITALS — BP 122/72 | HR 64

## 2020-02-05 DIAGNOSIS — L03113 Cellulitis of right upper limb: Secondary | ICD-10-CM

## 2020-02-05 DIAGNOSIS — L03115 Cellulitis of right lower limb: Secondary | ICD-10-CM | POA: Diagnosis not present

## 2020-02-05 DIAGNOSIS — R001 Bradycardia, unspecified: Secondary | ICD-10-CM | POA: Diagnosis not present

## 2020-02-05 MED ORDER — SULFAMETHOXAZOLE-TRIMETHOPRIM 800-160 MG PO TABS: 1.0000 | ORAL_TABLET | Freq: Two times a day (BID) | ORAL | 0 refills | Status: AC

## 2020-02-05 MED ORDER — CEPHALEXIN 500 MG PO CAPS: 500.0000 mg | ORAL_CAPSULE | Freq: Three times a day (TID) | ORAL | 0 refills | Status: AC

## 2020-02-05 NOTE — Progress Notes (Unsigned)
   Subjective:    Patient ID: Rachael [name], female    DOB: 08-13-1933, 87 y.o.   MRN: [id number]  HPI Here with her son for another bout of cellulitis. We treated her for recurrent cellulitis in the right leg in November, first with Keflex and then with Doxycycline. This seemed to be effective. Then she was hospitalized in December with Covid pneumonia, and this involved having an IV line in the right arm. She developed redness and swelling in the right hand, and on 01-19-20 she was treated at [facility] for cellulitis with 7 days of Bactrim DS and Keflex. The hand seemed to improve. Then yesterday she again noticed some redness and tenderness coming back in the right hand and on the right lower leg. No fever. She feels well in general.    Review of Systems  Constitutional: Negative.   Respiratory: Negative.   Cardiovascular: Negative.   Skin: Positive for color change.       Objective:   Physical Exam Constitutional:      Appearance: She is not ill-appearing.     Comments: In her wheelchair   Cardiovascular:     Rate and Rhythm: Normal rate and regular rhythm.     Pulses: Normal pulses.     Heart sounds: Normal heart sounds.  Pulmonary:     Effort: Pulmonary effort is normal.     Breath sounds: Normal breath sounds.  Skin:    Comments: There is erythema and warmth and swelling and tenderness over the right thumb and hand. There is also erythema and warmth and tenderness (no swelling) in the right lower leg   Neurological:     Mental Status: She is alert.           Assessment & Plan:  Recurrent cellulitis in the right hand and right leg. Treat with 14 days of Keflex and Bactrim DS. Recheck in 14 days. Alysia Penna, MD

## 2020-02-15 ENCOUNTER — Telehealth: Payer: Self-pay | Admitting: Family Medicine

## 2020-02-15 NOTE — Telephone Encounter (Signed)
Please okay these orders  ?

## 2020-02-15 NOTE — Telephone Encounter (Signed)
Mark w/[facility] is calling in to get [facility] nursing and would like to have the orders faxed to [phone].

## 2020-02-19 ENCOUNTER — Telehealth: Payer: Self-pay | Admitting: Family Medicine

## 2020-02-19 ENCOUNTER — Other Ambulatory Visit: Payer: Self-pay

## 2020-02-19 ENCOUNTER — Other Ambulatory Visit: Payer: Self-pay | Admitting: Family Medicine

## 2020-02-19 DIAGNOSIS — Z742 Need for assistance at home and no other household member able to render care: Secondary | ICD-10-CM

## 2020-02-19 NOTE — Telephone Encounter (Signed)
No longer needed

## 2020-02-19 NOTE — Telephone Encounter (Signed)
Please okay these orders  ?

## 2020-02-19 NOTE — Telephone Encounter (Signed)
Rachael [name] w/[facility] is calling to get orders that his colleague had called to get on 02/15/2020.  Called was transferred to [place] to assist with this.

## 2020-02-20 ENCOUNTER — Encounter: Payer: Self-pay | Admitting: Family Medicine

## 2020-02-20 ENCOUNTER — Other Ambulatory Visit: Payer: Self-pay

## 2020-02-20 ENCOUNTER — Ambulatory Visit (INDEPENDENT_AMBULATORY_CARE_PROVIDER_SITE_OTHER): Payer: PPO | Admitting: Family Medicine

## 2020-02-20 VITALS — BP 132/62 | HR 86 | Temp 97.3°F | Wt 220.0 lb

## 2020-02-20 DIAGNOSIS — L03115 Cellulitis of right lower limb: Secondary | ICD-10-CM

## 2020-02-20 DIAGNOSIS — E039 Hypothyroidism, unspecified: Secondary | ICD-10-CM | POA: Diagnosis not present

## 2020-02-20 DIAGNOSIS — Z9981 Dependence on supplemental oxygen: Secondary | ICD-10-CM | POA: Diagnosis not present

## 2020-02-20 DIAGNOSIS — M1990 Unspecified osteoarthritis, unspecified site: Secondary | ICD-10-CM | POA: Diagnosis not present

## 2020-02-20 DIAGNOSIS — I080 Rheumatic disorders of both mitral and aortic valves: Secondary | ICD-10-CM | POA: Diagnosis not present

## 2020-02-20 DIAGNOSIS — U071 COVID-19: Secondary | ICD-10-CM | POA: Diagnosis not present

## 2020-02-20 DIAGNOSIS — Z87440 Personal history of urinary (tract) infections: Secondary | ICD-10-CM | POA: Diagnosis not present

## 2020-02-20 MED ORDER — CEPHALEXIN 500 MG PO CAPS: 500.0000 mg | ORAL_CAPSULE | Freq: Three times a day (TID) | ORAL | 0 refills | Status: AC

## 2020-02-20 MED ORDER — SULFAMETHOXAZOLE-TRIMETHOPRIM 800-160 MG PO TABS: 1.0000 | ORAL_TABLET | Freq: Two times a day (BID) | ORAL | 0 refills | Status: DC

## 2020-02-20 NOTE — Progress Notes (Signed)
   Subjective:    Patient ID: Rachael [name], female    DOB: 04-06-1933, 87 y.o.   MRN: [id number]  HPI Here with concerns about recurrent cellulitis in the right leg. She was here on 02-05-20 with swelling and redness in the right hand and right leg. We treated her for cellulitis with a course of Bactrim DS and Keflex. Everything seemed to clear up, but yesterday she work up with redness in the right leg again. No pain or fever. She made this appt, but this morning she saw the redness in the leg has completely resolved. She is not sure what to do at this point.    Review of Systems  Constitutional: Negative.   Respiratory: Negative.   Cardiovascular: Positive for leg swelling. Negative for chest pain and palpitations.  Skin: Positive for color change.       Objective:   Physical Exam Constitutional:      Appearance: Normal appearance.     Comments: Walks with a walker   Cardiovascular:     Rate and Rhythm: Normal rate and regular rhythm.     Pulses: Normal pulses.     Heart sounds: Normal heart sounds.  Pulmonary:     Effort: Pulmonary effort is normal.     Breath sounds: Normal breath sounds.  Skin:    Comments: Right lower leg appears normal, no warmth or swelling or erythema   Neurological:     Mental Status: She is alert.           Assessment & Plan:  Recurrent cellulitis. This seems to be quiescent today, but I gave her prescriptions for 10 more days of Bactrim DS and Keflex to get filled if the redness returns. Follow up prn.  Alysia Penna, MD

## 2020-02-21 ENCOUNTER — Other Ambulatory Visit: Payer: Self-pay | Admitting: Obstetrics and Gynecology

## 2020-02-21 DIAGNOSIS — Z1231 Encounter for screening mammogram for malignant neoplasm of breast: Secondary | ICD-10-CM

## 2020-02-21 NOTE — Telephone Encounter (Signed)
Called Mark to confirm if orders had been received.  He stated that he was covering for a coworker he will reach out to coworker to be sure have received orders.

## 2020-02-22 ENCOUNTER — Telehealth: Payer: Self-pay | Admitting: Family Medicine

## 2020-02-22 NOTE — Telephone Encounter (Signed)
FYI

## 2020-02-22 NOTE — Telephone Encounter (Signed)
Calvin from [facility] is calling to advise he did see the patient on 02/20/20 for nursing and physical therapy.  FYI

## 2020-02-27 ENCOUNTER — Other Ambulatory Visit: Payer: Self-pay

## 2020-02-27 ENCOUNTER — Ambulatory Visit (INDEPENDENT_AMBULATORY_CARE_PROVIDER_SITE_OTHER): Payer: PPO | Admitting: Family Medicine

## 2020-02-27 ENCOUNTER — Encounter: Payer: Self-pay | Admitting: Family Medicine

## 2020-02-27 VITALS — BP 132/60 | HR 88 | Temp 98.4°F | Ht <= 58 in

## 2020-02-27 DIAGNOSIS — Z87440 Personal history of urinary (tract) infections: Secondary | ICD-10-CM | POA: Diagnosis not present

## 2020-02-27 DIAGNOSIS — L989 Disorder of the skin and subcutaneous tissue, unspecified: Secondary | ICD-10-CM

## 2020-02-27 DIAGNOSIS — E039 Hypothyroidism, unspecified: Secondary | ICD-10-CM | POA: Diagnosis not present

## 2020-02-27 DIAGNOSIS — L03115 Cellulitis of right lower limb: Secondary | ICD-10-CM

## 2020-02-27 DIAGNOSIS — R059 Cough, unspecified: Secondary | ICD-10-CM

## 2020-02-27 DIAGNOSIS — U071 COVID-19: Secondary | ICD-10-CM | POA: Diagnosis not present

## 2020-02-27 DIAGNOSIS — I080 Rheumatic disorders of both mitral and aortic valves: Secondary | ICD-10-CM | POA: Diagnosis not present

## 2020-02-27 DIAGNOSIS — M1990 Unspecified osteoarthritis, unspecified site: Secondary | ICD-10-CM | POA: Diagnosis not present

## 2020-02-27 DIAGNOSIS — Z9981 Dependence on supplemental oxygen: Secondary | ICD-10-CM | POA: Diagnosis not present

## 2020-02-27 NOTE — Progress Notes (Signed)
   Subjective:    Patient ID: Rachael [name], female    DOB: 04-25-1933, 87 y.o.   MRN: [id number]  HPI Here for 3 issues. First she wants [place] to recheck her right lower leg. We saw her on 02-20-20 for cellulitis and gave her 10 day courses of Bactrim DS and Keflex. She says it has been doing well but she saw a small blister on it this morning and this alarmed her. Now it seems to be gone. Also she still has a dry cough that has persisted since her Covid infection in December. No fever or SOB. Lastly she has had a bump on the skin of her neck for at least 6 months. It does not bother her but it seems to be slowly getting larger.    Review of Systems  Constitutional: Negative.   Respiratory: Positive for cough. Negative for chest tightness, shortness of breath and wheezing.   Cardiovascular: Negative.   Skin: Positive for rash.       Objective:   Physical Exam Constitutional:      Appearance: Normal appearance.  Cardiovascular:     Rate and Rhythm: Normal rate and regular rhythm.     Pulses: Normal pulses.     Heart sounds: Normal heart sounds.  Pulmonary:     Effort: Pulmonary effort is normal. No respiratory distress.     Breath sounds: Normal breath sounds. No stridor. No wheezing, rhonchi or rales.  Skin:    Comments: The lower right leg actually looks good. No blisters are seen. No erythema or warmth or tenderness. There is a 4 mm papular smooth lesion on the left neck with pearly borders   Neurological:     Mental Status: She is alert.           Assessment & Plan:  The leg cellulitis appears to be under control. She will finish up the antibiotics and follow up as needed. The cough seems to be benign, and it is likely a hold over from the Covid infection. The neck lesion is a possible basal cell cancer, so we will refer her to Dermatology to evaluate.  Alysia Penna, MD

## 2020-03-07 DIAGNOSIS — Z85828 Personal history of other malignant neoplasm of skin: Secondary | ICD-10-CM | POA: Diagnosis not present

## 2020-03-07 DIAGNOSIS — I872 Venous insufficiency (chronic) (peripheral): Secondary | ICD-10-CM | POA: Diagnosis not present

## 2020-03-07 DIAGNOSIS — L821 Other seborrheic keratosis: Secondary | ICD-10-CM | POA: Diagnosis not present

## 2020-03-07 DIAGNOSIS — C4441 Basal cell carcinoma of skin of scalp and neck: Secondary | ICD-10-CM | POA: Diagnosis not present

## 2020-03-07 DIAGNOSIS — I8311 Varicose veins of right lower extremity with inflammation: Secondary | ICD-10-CM | POA: Diagnosis not present

## 2020-03-07 DIAGNOSIS — I8312 Varicose veins of left lower extremity with inflammation: Secondary | ICD-10-CM | POA: Diagnosis not present

## 2020-03-15 DIAGNOSIS — Z87440 Personal history of urinary (tract) infections: Secondary | ICD-10-CM | POA: Diagnosis not present

## 2020-03-15 DIAGNOSIS — U071 COVID-19: Secondary | ICD-10-CM | POA: Diagnosis not present

## 2020-03-15 DIAGNOSIS — M1990 Unspecified osteoarthritis, unspecified site: Secondary | ICD-10-CM | POA: Diagnosis not present

## 2020-03-15 DIAGNOSIS — Z9981 Dependence on supplemental oxygen: Secondary | ICD-10-CM | POA: Diagnosis not present

## 2020-03-15 DIAGNOSIS — E039 Hypothyroidism, unspecified: Secondary | ICD-10-CM | POA: Diagnosis not present

## 2020-03-15 DIAGNOSIS — I080 Rheumatic disorders of both mitral and aortic valves: Secondary | ICD-10-CM | POA: Diagnosis not present

## 2020-04-11 ENCOUNTER — Other Ambulatory Visit: Payer: Self-pay

## 2020-04-11 ENCOUNTER — Ambulatory Visit
Admission: RE | Admit: 2020-04-11 | Discharge: 2020-04-11 | Disposition: A | Discharge status: Home / Self Care | Payer: PPO | Source: Ambulatory Visit | Attending: Obstetrics and Gynecology | Admitting: Obstetrics and Gynecology

## 2020-04-11 DIAGNOSIS — Z1231 Encounter for screening mammogram for malignant neoplasm of breast: Secondary | ICD-10-CM

## 2020-04-29 ENCOUNTER — Ambulatory Visit (INDEPENDENT_AMBULATORY_CARE_PROVIDER_SITE_OTHER): Payer: PPO | Admitting: Family Medicine

## 2020-04-29 ENCOUNTER — Other Ambulatory Visit: Payer: Self-pay

## 2020-04-29 ENCOUNTER — Encounter: Payer: Self-pay | Admitting: Family Medicine

## 2020-04-29 VITALS — BP 128/76 | HR 86 | Temp 97.6°F

## 2020-04-29 DIAGNOSIS — M353 Polymyalgia rheumatica: Secondary | ICD-10-CM | POA: Diagnosis not present

## 2020-04-29 DIAGNOSIS — R29898 Other symptoms and signs involving the musculoskeletal system: Secondary | ICD-10-CM

## 2020-04-29 DIAGNOSIS — M8949 Other hypertrophic osteoarthropathy, multiple sites: Secondary | ICD-10-CM

## 2020-04-29 DIAGNOSIS — M159 Polyosteoarthritis, unspecified: Secondary | ICD-10-CM

## 2020-04-29 MED ORDER — PREDNISONE 5 MG PO TABS: 5.0000 mg | ORAL_TABLET | Freq: Every day | ORAL | 3 refills | Status: DC

## 2020-04-29 NOTE — Progress Notes (Signed)
   Subjective:    Patient ID: Rachael [name], female    DOB: 06/11/1933, 87 y.o.   MRN: [id number]  HPI Here to discuss if she can get back on a low maintenance dose of Prednisone. She has been working hard at home with exercises to strengthen her hips and legs, and she is able to walk without her cane around the house. She still brings it with her when she leaves the house just for support. She had taken 10 mg a day of Prednisone in the past with good results. Otherwise she feels well.    Review of Systems  Constitutional: Negative.   Respiratory: Negative.   Cardiovascular: Negative.   Neurological: Positive for weakness.       Objective:   Physical Exam Constitutional:      Appearance: Normal appearance.     Comments: Walks unassisted and she mostly carries a cane up in the air   Cardiovascular:     Rate and Rhythm: Normal rate and regular rhythm.     Pulses: Normal pulses.     Heart sounds: Normal heart sounds.  Pulmonary:     Effort: Pulmonary effort is normal.     Breath sounds: Normal breath sounds.  Neurological:     Mental Status: She is alert.           Assessment & Plan:  PMR and OA, stable. We will get her back on 5 mg a day of Prednisone. Hopefully this will improve her mobility and stamina.  Alysia Penna, MD

## 2020-05-13 ENCOUNTER — Ambulatory Visit (INDEPENDENT_AMBULATORY_CARE_PROVIDER_SITE_OTHER): Payer: PPO

## 2020-05-13 ENCOUNTER — Other Ambulatory Visit: Payer: Self-pay

## 2020-05-13 VITALS — Wt 220.0 lb

## 2020-05-13 DIAGNOSIS — Z0000 Encounter for general adult medical examination without abnormal findings: Secondary | ICD-10-CM

## 2020-05-13 NOTE — Patient Instructions (Addendum)
Ms. Stanczak , Thank you for taking time to come for your Medicare Wellness Visit. I appreciate your ongoing commitment to your health goals. Please review the following plan we discussed and let me know if I can assist you in the future.   Screening recommendations/referrals: Colonoscopy: No longer required  Mammogram: Done 04/11/20 Bone Density: Done 09/09/16 Recommended yearly ophthalmology/optometry visit for glaucoma screening and checkup Recommended yearly dental visit for hygiene and checkup  Vaccinations: Influenza vaccine: Declined Pneumococcal vaccine: Declined Tdap vaccine: Up to date Shingles vaccine: Completed 4/30 & 08/23/17   Covid-19:Declined  Advanced directives: Please bring a copy of your health care power of attorney and living will to the office at your convenience.  Conditions/risks identified: Get back to line dancing without the walker  Next appointment: Follow up in one year for your annual wellness visit    Preventive Care 65 Years and Older, Female Preventive care refers to lifestyle choices and visits with your health care provider that can promote health and wellness. What does preventive care include?  A yearly physical exam. This is also called an annual well check.  Dental exams once or twice a year.  Routine eye exams. Ask your health care provider how often you should have your eyes checked.  Personal lifestyle choices, including:  Daily care of your teeth and gums.  Regular physical activity.  Eating a healthy diet.  Avoiding tobacco and drug use.  Limiting alcohol use.  Practicing safe sex.  Taking low-dose aspirin every day.  Taking vitamin and mineral supplements as recommended by your health care provider. What happens during an annual well check? The services and screenings done by your health care provider during your annual well check will depend on your age, overall health, lifestyle risk factors, and family history of  disease. Counseling  Your health care provider may ask you questions about your:  Alcohol use.  Tobacco use.  Drug use.  Emotional well-being.  Home and relationship well-being.  Sexual activity.  Eating habits.  History of falls.  Memory and ability to understand (cognition).  Work and work Statistician.  Reproductive health. Screening  You may have the following tests or measurements:  Height, weight, and BMI.  Blood pressure.  Lipid and cholesterol levels. These may be checked every 5 years, or more frequently if you are over 69 years old.  Skin check.  Lung cancer screening. You may have this screening every year starting at age 31 if you have a 30-pack-year history of smoking and currently smoke or have quit within the past 15 years.  Fecal occult blood test (FOBT) of the stool. You may have this test every year starting at age 55.  Flexible sigmoidoscopy or colonoscopy. You may have a sigmoidoscopy every 5 years or a colonoscopy every 10 years starting at age 33.  Hepatitis C blood test.  Hepatitis B blood test.  Sexually transmitted disease (STD) testing.  Diabetes screening. This is done by checking your blood sugar (glucose) after you have not eaten for a while (fasting). You may have this done every 1-3 years.  Bone density scan. This is done to screen for osteoporosis. You may have this done starting at age 31.  Mammogram. This may be done every 1-2 years. Talk to your health care provider about how often you should have regular mammograms. Talk with your health care provider about your test results, treatment options, and if necessary, the need for more tests. Vaccines  Your health care provider may recommend  certain vaccines, such as:  Influenza vaccine. This is recommended every year.  Tetanus, diphtheria, and acellular pertussis (Tdap, Td) vaccine. You may need a Td booster every 10 years.  Zoster vaccine. You may need this after age  52.  Pneumococcal 13-valent conjugate (PCV13) vaccine. One dose is recommended after age 28.  Pneumococcal polysaccharide (PPSV23) vaccine. One dose is recommended after age 13. Talk to your health care provider about which screenings and vaccines you need and how often you need them. This information is not intended to replace advice given to you by your health care provider. Make sure you discuss any questions you have with your health care provider. Document Released: 01/24/2015 Document Revised: 09/17/2015 Document Reviewed: 10/29/2014 Elsevier Interactive Patient Education  2017 [place] Prevention in the Home Falls can cause injuries. They can happen to people of all ages. There are many things you can do to make your home safe and to help prevent falls. What can I do on the outside of my home?  Regularly fix the edges of walkways and driveways and fix any cracks.  Remove anything that might make you trip as you walk through a door, such as a raised step or threshold.  Trim any bushes or trees on the path to your home.  Use bright outdoor lighting.  Clear any walking paths of anything that might make someone trip, such as rocks or tools.  Regularly check to see if handrails are loose or broken. Make sure that both sides of any steps have handrails.  Any raised decks and porches should have guardrails on the edges.  Have any leaves, snow, or ice cleared regularly.  Use sand or salt on walking paths during winter.  Clean up any spills in your garage right away. This includes oil or grease spills. What can I do in the bathroom?  Use night lights.  Install grab bars by the toilet and in the tub and shower. Do not use towel bars as grab bars.  Use non-skid mats or decals in the tub or shower.  If you need to sit down in the shower, use a plastic, non-slip stool.  Keep the floor dry. Clean up any water that spills on the floor as soon as it happens.  Remove  soap buildup in the tub or shower regularly.  Attach bath mats securely with double-sided non-slip rug tape.  Do not have throw rugs and other things on the floor that can make you trip. What can I do in the bedroom?  Use night lights.  Make sure that you have a light by your bed that is easy to reach.  Do not use any sheets or blankets that are too big for your bed. They should not hang down onto the floor.  Have a firm chair that has side arms. You can use this for support while you get dressed.  Do not have throw rugs and other things on the floor that can make you trip. What can I do in the kitchen?  Clean up any spills right away.  Avoid walking on wet floors.  Keep items that you use a lot in easy-to-reach places.  If you need to reach something above you, use a strong step stool that has a grab bar.  Keep electrical cords out of the way.  Do not use floor polish or wax that makes floors slippery. If you must use wax, use non-skid floor wax.  Do not have throw rugs and other  things on the floor that can make you trip. What can I do with my stairs?  Do not leave any items on the stairs.  Make sure that there are handrails on both sides of the stairs and use them. Fix handrails that are broken or loose. Make sure that handrails are as long as the stairways.  Check any carpeting to make sure that it is firmly attached to the stairs. Fix any carpet that is loose or worn.  Avoid having throw rugs at the top or bottom of the stairs. If you do have throw rugs, attach them to the floor with carpet tape.  Make sure that you have a light switch at the top of the stairs and the bottom of the stairs. If you do not have them, ask someone to add them for you. What else can I do to help prevent falls?  Wear shoes that:  Do not have high heels.  Have rubber bottoms.  Are comfortable and fit you well.  Are closed at the toe. Do not wear sandals.  If you use a  stepladder:  Make sure that it is fully opened. Do not climb a closed stepladder.  Make sure that both sides of the stepladder are locked into place.  Ask someone to hold it for you, if possible.  Clearly mark and make sure that you can see:  Any grab bars or handrails.  First and last steps.  Where the edge of each step is.  Use tools that help you move around (mobility aids) if they are needed. These include:  Canes.  Walkers.  Scooters.  Crutches.  Turn on the lights when you go into a dark area. Replace any light bulbs as soon as they burn out.  Set up your furniture so you have a clear path. Avoid moving your furniture around.  If any of your floors are uneven, fix them.  If there are any pets around you, be aware of where they are.  Review your medicines with your doctor. Some medicines can make you feel dizzy. This can increase your chance of falling. Ask your doctor what other things that you can do to help prevent falls. This information is not intended to replace advice given to you by your health care provider. Make sure you discuss any questions you have with your health care provider. Document Released: 10/24/2008 Document Revised: 06/05/2015 Document Reviewed: 02/01/2014 Elsevier Interactive Patient Education  2017 [employer].

## 2020-05-13 NOTE — Progress Notes (Signed)
Virtual Visit via Telephone Note  I connected with  Rachael [name] on 05/13/20 at  3:15 PM EDT by telephone and verified that I am speaking with the correct person using two identifiers.  Location: Patient: Home Provider: Office  Persons participating in the virtual visit: patient/Nurse Health Advisor   I discussed the limitations, risks, security and privacy concerns of performing an evaluation and management service by telephone and the availability of in person appointments. The patient expressed understanding and agreed to proceed.  Interactive audio and video telecommunications were attempted between this nurse and patient, however failed, due to patient having technical difficulties OR patient did not have access to video capability.  We continued and completed visit with audio only.  Some vital signs may be absent or patient reported.   Willette Brace, LPN    Subjective:   Rachael [name] is a 87 y.o. female who presents for Medicare Annual (Subsequent) preventive examination.  Review of Systems     Cardiac Risk Factors include: advanced age (>109mn, >>45women);obesity (BMI >30kg/m2);dyslipidemia     Objective:    Today's Vitals   05/13/20 1515  Weight: 220 lb 0.3 oz (99.8 kg)   Body mass index is 1,074.24 kg/m.  Advanced Directives 05/13/2020 12/27/2018 04/13/2017 08/27/2016 04/21/2016 02/06/2016 10/22/2015  Does Patient Have a Medical Advance Directive? Yes Yes Yes Yes Yes Yes No  Type of AParamedicof AMonticelloLiving will H[place]linLiving will - HArgyleLiving will HGrandfatherLiving will HHackleburgLiving will -  Does patient want to make changes to medical advance directive? - No - Patient declined - - - No - Patient declined -  Copy of H[place]in Chart? No - copy requested No - copy requested - - Yes Yes -  Would patient like information on creating a  medical advance directive? - - - - - - -    Current Medications (verified) Outpatient Encounter Medications as of 05/13/2020  Medication Sig  . aspirin EC 81 MG EC tablet Take 1 tablet (81 mg total) by mouth daily.  . Cholecalciferol (VITAMIN D3 PO) Take 1 tablet by mouth daily.  . Cyanocobalamin (B-12 PO) Take 1 tablet by mouth daily.   .Marland Kitchenlevothyroxine (SYNTHROID) 112 MCG tablet Take 1 tablet (112 mcg total) by mouth daily before breakfast.  . Nutritional Supplements (JUICE PLUS FIBRE PO) Juice Plus  . predniSONE (DELTASONE) 5 MG tablet Take 1 tablet (5 mg total) by mouth daily with breakfast.   No facility-administered encounter medications on file as of 05/13/2020.    Allergies (verified) Prednisone, Shellfish allergy, Percocet [oxycodone-acetaminophen], Iodinated diagnostic agents, and Iodine   History: Past Medical History:  Diagnosis Date  . Breast cancer (HCC)    R  . CAROTID STENOSIS   . Chronic venous insufficiency   . CONTACT DERMATITIS   . DUPUYTREN'S CONTRACTURE   . Edema    pt  states had edema of feet and legs has been on lasix per dr. to help  . GLUCOSE INTOLERANCE   . HLD (hyperlipidemia)   . Hypothyroidism    Dr. RAla Dachin FWadley  . MENOPAUSAL SYNDROME   . Obesity   . Osteoarthritis    bilat hips, severe  . PVD (peripheral vascular disease) ([facility]    mild carotid 11/05  . VITAMIN D DEFICIENCY    Past Surgical History:  Procedure Laterality Date  . 2D echo  11/29/03  normal LV size. normal EF   . [place]   lumbarectomy  . BREAST BIOPSY  05/11/11   right breast  . BREAST LUMPECTOMY     right breast  . CHOLECYSTECTOMY    . FINGER SURGERY    . HERNIA REPAIR     incisional hernia  . INGUINAL HERNIA REPAIR    . JOINT REPLACEMENT Bilateral    Dr. Maureen Ralphs  . normal caronary arteries     per pt. 2003  . right rotator cuff surgery    . TONSILLECTOMY    . TOTAL HIP ARTHROPLASTY  05/2008 and 10/2008   B hip - alusio   Family  History  Problem Relation Age of Onset  . Uterine cancer Mother 90  . Cancer Mother   . Breast cancer Daughter 58       BRCA negative (no BART)  . Cancer Daughter   . Pancreatic cancer Maternal Grandfather        diagnosed in his 60s  . Prostate cancer Maternal Uncle        diagnosed in his 69s  . Colon cancer Cousin   . Prostate cancer Paternal Uncle        diagnosed in his 86s  . Clotting disorder Maternal Grandmother   . Leukemia Cousin   . Heart disease Father        before age 4  . Varicose Veins Father   . Heart disease Brother        before age 67  . Coronary artery disease Other        family hx - female 1st degree relative <60  . Hypothyroidism Other        aunt   . Uterine cancer Cousin   . Prostate cancer Cousin   . Cancer Sister   . Anesthesia problems Neg Hx   . Adrenal disorder Neg Hx    Social History   Socioeconomic History  . Marital status: Divorced    Spouse name: Not on file  . Number of children: 5  . Years of education: 1 year college  . Highest education level: High school graduate  Occupational History  . Not on file  Tobacco Use  . Smoking status: Never Smoker  . Smokeless tobacco: Never Used  Substance and Sexual Activity  . Alcohol use: No    Alcohol/week: 0.0 standard drinks  . Drug use: No  . Sexual activity: Not Currently  Other Topics Concern  . Not on file  Social History Narrative   Divorced; works part time   5 children   Social Determinants of Radio broadcast assistant Strain: [place]   . Difficulty of Paying Living Expenses: Not hard at all  Food Insecurity: No Food Insecurity  . Worried About Charity fundraiser in the Last Year: Never true  . Ran Out of Food in the Last Year: Never true  Transportation Needs: No Transportation Needs  . Lack of Transportation (Medical): No  . Lack of Transportation (Non-Medical): No  Physical Activity: Sufficiently Active  . Days of Exercise per Week: 3 days  . Minutes of  Exercise per Session: 60 min  Stress: No Stress Concern Present  . Feeling of Stress : Not at all  Social Connections: Moderately Isolated  . Frequency of Communication with Friends and Family: More than three times a week  . Frequency of Social Gatherings with Friends and Family: More than three times a week  . Attends Religious Services:  More than 4 times per year  . Active Member of Clubs or Organizations: No  . Attends Archivist Meetings: Never  . Marital Status: Divorced    Tobacco Counseling Counseling given: Not Answered   Clinical Intake:  Pre-visit preparation completed: Yes  Pain : No/denies pain     BMI - recorded: 1074.24 Nutritional Status: BMI > 30  Obese Diabetes: No  How often do you need to have someone help you when you read instructions, pamphlets, or other written materials from your doctor or pharmacy?: 1 - Never  Diabetic?No  Interpreter Needed?: No  Information entered by :: Charlott Rakes, LPN   Activities of Daily Living In your present state of health, do you have any difficulty performing the following activities: 05/13/2020  Hearing? N  Vision? N  Difficulty concentrating or making decisions? N  Walking or climbing stairs? N  Dressing or bathing? N  Doing errands, shopping? N  Preparing Food and eating ? N  Using the Toilet? N  In the past six months, have you accidently leaked urine? N  Comment urgency at times  Do you have problems with loss of bowel control? N  Managing your Medications? N  Managing your Finances? N  Housekeeping or managing your Housekeeping? N  Some recent data might be hidden    Patient Care Team: [name], MD as PCP - General (Family Medicine) Gaynelle Arabian, MD as Consulting Physician (Orthopedic Surgery) Armandina Gemma, MD as Consulting Physician (General Surgery) Dian Queen, MD (Obstetrics and Gynecology) Jovita Kussmaul, MD (General Surgery) Magrinat, Virgie Dad, MD (Hematology and  Oncology) Valinda Party, MD as Referring Physician (Rheumatology)  Indicate any recent Medical Services you may have received from other than [facility] providers in the past year (date may be approximate).     Assessment:   This is a routine wellness examination for Rachael [name].  Hearing/Vision screen  Hearing Screening   125Hz  250Hz  500Hz  1000Hz  2000Hz  3000Hz  4000Hz  6000Hz  8000Hz   Right ear:           Left ear:           Comments: Pt denies any hearing issues   Vision Screening Comments: Pt follows up with Dr Marica Otter and Dr Celene Squibb for eye exams   Dietary issues and exercise activities discussed: Current Exercise Habits: Home exercise routine;Structured exercise class, Type of exercise: Other - see comments (line dances and [employer])  Goals Addressed            This Visit's Progress   . Patient Stated       Get back to line dancing       Depression Screen PHQ 2/9 Scores 05/13/2020 02/27/2020 12/27/2018 04/13/2017 05/20/2015 07/08/2014 10/04/2012  PHQ - 2 Score 0 0 0 0 0 0 0    Fall Risk Fall Risk  05/13/2020 02/27/2020 02/20/2020 12/27/2018 04/13/2017  Falls in the past year? 0 0 0 0 No  Number falls in past yr: 0 0 0 - -  Injury with Fall? 0 0 0 - -  Risk for fall due to : Impaired vision - - - -  Follow up Falls prevention discussed Falls evaluation completed - - -    FALL RISK PREVENTION PERTAINING TO THE HOME:  Any stairs in or around the home? Yes  If so, are there any without handrails? No  Home free of loose throw rugs in walkways, pet beds, electrical cords, etc? Yes  Adequate lighting in your home to reduce  risk of falls? Yes   ASSISTIVE DEVICES UTILIZED TO PREVENT FALLS:  Life alert? No  Use of a cane, walker or w/c? No  Grab bars in the bathroom? No  Shower chair or bench in shower? No  Elevated toilet seat or a handicapped toilet? No   TIMED UP AND GO:  Was the test performed? No     Cognitive Function: MMSE - Mini Mental State Exam 04/13/2017  Not  completed: (No Data)     6CIT Screen 05/13/2020  What Year? 0 points  What month? 0 points    Immunizations Immunization History  Administered Date(s) Administered  . PPD Test 03/20/2015  . Td 02/11/1988, 11/14/2007, 02/22/2014  . Zoster Recombinat (Shingrix) 05/10/2017, 08/23/2017    TDAP status: Up to date  Flu Vaccine status: Declined, Education has been provided regarding the importance of this vaccine but patient still declined. Advised may receive this vaccine at local pharmacy or Health Dept. Aware to provide a copy of the vaccination record if obtained from local pharmacy or Health Dept. Verbalized acceptance and understanding.  Pneumococcal vaccine status: Declined,  Education has been provided regarding the importance of this vaccine but patient still declined. Advised may receive this vaccine at local pharmacy or Health Dept. Aware to provide a copy of the vaccination record if obtained from local pharmacy or Health Dept. Verbalized acceptance and understanding.   Covid-19 vaccine status: Declined, Education has been provided regarding the importance of this vaccine but patient still declined. Advised may receive this vaccine at local pharmacy or Health Dept.or vaccine clinic. Aware to provide a copy of the vaccination record if obtained from local pharmacy or Health Dept. Verbalized acceptance and understanding.  Qualifies for Shingles Vaccine? Yes   Zostavax completed Yes   Shingrix Completed?: Yes  Screening Tests Health Maintenance  Topic Date Due  . COVID-19 Vaccine (1) 05/29/2020 (Originally 12/11/1945)  . INFLUENZA VACCINE  01/10/2098 (Originally 08/11/2020)  . PNA vac Low Risk Adult (1 of 2 - PCV13) 01/10/2098 (Originally 12/10/2000)  . TETANUS/TDAP  02/23/2024  . DEXA SCAN  Completed  . HPV VACCINES  Aged Out    Health Maintenance  There are no preventive care reminders to display for this patient.  Colorectal cancer screening: No longer required.    Mammogram status: Completed 04/11/20. Repeat every year  Bone Density status: Completed 09/09/16. Results reflect: Bone density results: NORMAL. Repeat every 0 years.   Additional Screening:   Vision Screening: Recommended annual ophthalmology exams for early detection of glaucoma and other disorders of the eye. Is the patient up to date with their annual eye exam?  Yes  Who is the provider or what is the name of the office in which the patient attends annual eye exams? Dr Marica Otter  If pt is not established with a provider, would they like to be referred to a provider to establish care? No .   Dental Screening: Recommended annual dental exams for proper oral hygiene  Community Resource Referral / Chronic Care Management: CRR required this visit?  No   CCM required this visit?  No      Plan:     I have personally reviewed and noted the following in the patient's chart:   . Medical and social history . Use of alcohol, tobacco or illicit drugs  . Current medications and supplements including opioid prescriptions.  . Functional ability and status . Nutritional status . Physical activity . Advanced directives . List of other physicians . Hospitalizations, surgeries,  and ER visits in previous 12 months . Vitals . Screenings to include cognitive, depression, and falls . Referrals and appointments  In addition, I have reviewed and discussed with patient certain preventive protocols, quality metrics, and best practice recommendations. A written personalized care plan for preventive services as well as general preventive health recommendations were provided to patient.     Willette Brace, LPN   07/12/9424   Nurse Notes: None

## 2020-05-21 ENCOUNTER — Telehealth: Payer: Self-pay | Admitting: Family Medicine

## 2020-05-21 NOTE — Telephone Encounter (Signed)
Patient is calling and wanted to see if it was ok for her to take [place] with prednisone, please advise. CB is [phone]

## 2020-05-21 NOTE — Telephone Encounter (Signed)
Yes she may safely take these together

## 2020-05-22 NOTE — Telephone Encounter (Signed)
Spoke with pt verbalized understanding of Dr Fry advise 

## 2020-06-04 DIAGNOSIS — Z961 Presence of intraocular lens: Secondary | ICD-10-CM | POA: Diagnosis not present

## 2020-06-06 ENCOUNTER — Telehealth: Payer: Self-pay | Admitting: Family Medicine

## 2020-06-06 NOTE — Telephone Encounter (Signed)
Want dr.Fry to know that she is walking on her on without the walker she want him to know the medication  Work.

## 2020-06-06 NOTE — Telephone Encounter (Signed)
FYI

## 2020-06-11 DIAGNOSIS — Z4789 Encounter for other orthopedic aftercare: Secondary | ICD-10-CM | POA: Diagnosis not present

## 2020-06-11 DIAGNOSIS — Z7952 Long term (current) use of systemic steroids: Secondary | ICD-10-CM | POA: Diagnosis not present

## 2020-06-11 DIAGNOSIS — Z9889 Other specified postprocedural states: Secondary | ICD-10-CM | POA: Diagnosis not present

## 2020-06-11 DIAGNOSIS — R200 Anesthesia of skin: Secondary | ICD-10-CM | POA: Diagnosis not present

## 2020-06-11 DIAGNOSIS — M549 Dorsalgia, unspecified: Secondary | ICD-10-CM | POA: Diagnosis not present

## 2020-06-18 ENCOUNTER — Ambulatory Visit: Payer: PPO | Admitting: Family Medicine

## 2020-07-03 DIAGNOSIS — I83813 Varicose veins of bilateral lower extremities with pain: Secondary | ICD-10-CM | POA: Diagnosis not present

## 2020-07-03 DIAGNOSIS — I8311 Varicose veins of right lower extremity with inflammation: Secondary | ICD-10-CM | POA: Diagnosis not present

## 2020-07-03 DIAGNOSIS — I8312 Varicose veins of left lower extremity with inflammation: Secondary | ICD-10-CM | POA: Diagnosis not present

## 2020-07-03 DIAGNOSIS — I83893 Varicose veins of bilateral lower extremities with other complications: Secondary | ICD-10-CM | POA: Diagnosis not present

## 2020-07-16 ENCOUNTER — Ambulatory Visit: Payer: PPO | Admitting: Internal Medicine

## 2020-07-17 DIAGNOSIS — I8312 Varicose veins of left lower extremity with inflammation: Secondary | ICD-10-CM | POA: Diagnosis not present

## 2020-07-17 DIAGNOSIS — I8311 Varicose veins of right lower extremity with inflammation: Secondary | ICD-10-CM | POA: Diagnosis not present

## 2020-07-17 DIAGNOSIS — I83893 Varicose veins of bilateral lower extremities with other complications: Secondary | ICD-10-CM | POA: Diagnosis not present

## 2020-07-21 ENCOUNTER — Telehealth: Payer: Self-pay | Admitting: Family Medicine

## 2020-07-21 NOTE — Telephone Encounter (Signed)
Pt is calling in stating that she has a bad case of poison ivy and it is spreading to other parts of her body.  Pt stated that she is already taking prednisone 5 MG for another dx and wanted to know if it is okay for her to up the prednisone to help with the poison ivy.  Pt would like to have a call back.

## 2020-07-21 NOTE — Telephone Encounter (Signed)
Last office visit- 04/29/20.  Please advise 

## 2020-07-21 NOTE — Telephone Encounter (Signed)
Call in a 4 mg Medrol dose pack (21 tablets) to take on top of her usual Prednisone

## 2020-07-23 ENCOUNTER — Other Ambulatory Visit: Payer: Self-pay

## 2020-07-23 MED ORDER — METHYLPREDNISOLONE 4 MG PO TBPK: ORAL_TABLET | ORAL | 0 refills | Status: DC

## 2020-07-23 NOTE — Telephone Encounter (Signed)
Left pt detailed message with Dr Sarajane Jews advise, Rx sent to pt pharmacy

## 2020-07-29 DIAGNOSIS — C4441 Basal cell carcinoma of skin of scalp and neck: Secondary | ICD-10-CM | POA: Diagnosis not present

## 2020-07-29 DIAGNOSIS — D1801 Hemangioma of skin and subcutaneous tissue: Secondary | ICD-10-CM | POA: Diagnosis not present

## 2020-07-29 DIAGNOSIS — I8311 Varicose veins of right lower extremity with inflammation: Secondary | ICD-10-CM | POA: Diagnosis not present

## 2020-07-29 DIAGNOSIS — D2261 Melanocytic nevi of right upper limb, including shoulder: Secondary | ICD-10-CM | POA: Diagnosis not present

## 2020-07-29 DIAGNOSIS — I872 Venous insufficiency (chronic) (peripheral): Secondary | ICD-10-CM | POA: Diagnosis not present

## 2020-07-29 DIAGNOSIS — Z85828 Personal history of other malignant neoplasm of skin: Secondary | ICD-10-CM | POA: Diagnosis not present

## 2020-07-29 DIAGNOSIS — D692 Other nonthrombocytopenic purpura: Secondary | ICD-10-CM | POA: Diagnosis not present

## 2020-07-29 DIAGNOSIS — L821 Other seborrheic keratosis: Secondary | ICD-10-CM | POA: Diagnosis not present

## 2020-07-29 DIAGNOSIS — L814 Other melanin hyperpigmentation: Secondary | ICD-10-CM | POA: Diagnosis not present

## 2020-07-29 DIAGNOSIS — D2262 Melanocytic nevi of left upper limb, including shoulder: Secondary | ICD-10-CM | POA: Diagnosis not present

## 2020-07-29 DIAGNOSIS — I8312 Varicose veins of left lower extremity with inflammation: Secondary | ICD-10-CM | POA: Diagnosis not present

## 2020-08-05 ENCOUNTER — Ambulatory Visit: Payer: PPO | Admitting: Internal Medicine

## 2020-08-08 DIAGNOSIS — E018 Other iodine-deficiency related thyroid disorders and allied conditions: Secondary | ICD-10-CM | POA: Diagnosis not present

## 2020-08-08 DIAGNOSIS — R947 Abnormal results of other endocrine function studies: Secondary | ICD-10-CM | POA: Diagnosis not present

## 2020-08-08 DIAGNOSIS — R5383 Other fatigue: Secondary | ICD-10-CM | POA: Diagnosis not present

## 2020-08-08 DIAGNOSIS — R5382 Chronic fatigue, unspecified: Secondary | ICD-10-CM | POA: Diagnosis not present

## 2020-08-08 DIAGNOSIS — E559 Vitamin D deficiency, unspecified: Secondary | ICD-10-CM | POA: Diagnosis not present

## 2020-08-26 DIAGNOSIS — C4441 Basal cell carcinoma of skin of scalp and neck: Secondary | ICD-10-CM | POA: Diagnosis not present

## 2020-08-26 DIAGNOSIS — Z85828 Personal history of other malignant neoplasm of skin: Secondary | ICD-10-CM | POA: Diagnosis not present

## 2020-09-01 DIAGNOSIS — Z124 Encounter for screening for malignant neoplasm of cervix: Secondary | ICD-10-CM | POA: Diagnosis not present

## 2020-09-01 DIAGNOSIS — Z01419 Encounter for gynecological examination (general) (routine) without abnormal findings: Secondary | ICD-10-CM | POA: Diagnosis not present

## 2020-09-16 DIAGNOSIS — N958 Other specified menopausal and perimenopausal disorders: Secondary | ICD-10-CM | POA: Diagnosis not present

## 2020-09-29 ENCOUNTER — Telehealth: Payer: Self-pay | Admitting: Family Medicine

## 2020-09-29 NOTE — Telephone Encounter (Signed)
Patient called today stating that she is needing to ask Dr. Sarajane Jews or his nurse a question. Patient would not elaborate on what the question was when asked, but said that she would like to speak to one of them to see if she will need to come in for an appointment for the issue she is having.  Patient's contact number is [phone].  Please advise.

## 2020-09-30 NOTE — Telephone Encounter (Signed)
Spoke with pt advised to schedule appointment with Dr Sarajane Jews, appointment scheduled for tomorrow

## 2020-10-01 ENCOUNTER — Encounter: Payer: Self-pay | Admitting: Family Medicine

## 2020-10-01 ENCOUNTER — Other Ambulatory Visit: Payer: Self-pay

## 2020-10-01 ENCOUNTER — Ambulatory Visit (INDEPENDENT_AMBULATORY_CARE_PROVIDER_SITE_OTHER): Payer: PPO | Admitting: Family Medicine

## 2020-10-01 VITALS — BP 118/64 | HR 57 | Temp 98.7°F

## 2020-10-01 DIAGNOSIS — R159 Full incontinence of feces: Secondary | ICD-10-CM | POA: Diagnosis not present

## 2020-10-01 NOTE — Progress Notes (Signed)
   Subjective:    Patient ID: Rachael [name], female    DOB: December 23, 1933, 87 y.o.   MRN: [id number]  HPI Here for incontinence of stool that started 5 days ago. She has never had this before. The stools are soft and mushy, but not watery. They have a normal brown color. Sometimes a little stool will leak out of the rectum without her feeling this, though she denies any urgency. No cramps or pain or nausea. No urinary symptoms. No pain or numbness or weakness in the legs. She started taking Prednisone 5 mg daily in April for PMR pain, and she thinks the stool problem is the result of taking the Prednisone. She no longer has any muscle pains. She wants to stop it and she asks for advice on how to do this. She has been wearing protective undergarments. Of note she had a L3-L5 laminectomy on 12-06-16 for lumbar spinal stenosis.    Review of Systems  Constitutional: Negative.   Respiratory: Negative.    Cardiovascular: Negative.   Gastrointestinal:  Negative for abdominal distention, abdominal pain, anal bleeding, blood in stool, constipation, diarrhea, nausea, rectal pain and vomiting.  Genitourinary: Negative.       Objective:   Physical Exam Constitutional:      Appearance: Normal appearance.     Comments: Walks with a walker   Cardiovascular:     Rate and Rhythm: Normal rate and regular rhythm.     Pulses: Normal pulses.     Heart sounds: Normal heart sounds.  Pulmonary:     Effort: Pulmonary effort is normal.     Breath sounds: Normal breath sounds.  Abdominal:     General: Abdomen is flat. Bowel sounds are normal. There is no distension.     Palpations: Abdomen is soft. There is no mass.     Tenderness: There is no abdominal tenderness. There is no guarding or rebound.     Hernia: No hernia is present.  Musculoskeletal:     Cervical back: No rigidity or tenderness.  Lymphadenopathy:     Cervical: No cervical adenopathy.  Neurological:     Mental Status: She is alert.           Assessment & Plan:  Stool incontinence, likely a side effect of the Prednisone. We agreed to stop this. She will taper off it by taking one pill every other day for 10 days and then stopping it. If the problem persists past the 10 days, we will re-evaluate and probably get a lumber spine MRI to rule out cauda equina syndrome.  Alysia Penna, MD

## 2020-10-06 ENCOUNTER — Telehealth: Payer: Self-pay | Admitting: Family Medicine

## 2020-10-06 DIAGNOSIS — R159 Full incontinence of feces: Secondary | ICD-10-CM

## 2020-10-06 NOTE — Telephone Encounter (Signed)
Pt call and stated she want Rachael [name] to give her a call back she want to talk about the prednisone.

## 2020-10-07 NOTE — Telephone Encounter (Signed)
Called and spoke patient. She is currently still experiencing incontinence around 1 to 2 times per day and sometimes more.   She has currently been off the prednisone for around 2 days.  Patient is seeking advisement as next steps.

## 2020-10-08 NOTE — Telephone Encounter (Signed)
Left detailed message for pt to call the office with any questions

## 2020-10-08 NOTE — Telephone Encounter (Signed)
I suggest she see GI. I will refer her to [facility] unless she prefers someone else

## 2020-10-14 ENCOUNTER — Telehealth: Payer: Self-pay | Admitting: Family Medicine

## 2020-10-14 NOTE — Telephone Encounter (Signed)
Patient stated that she haven't heard anything back from GI about scheduling an appointment. Patient is requesting a phone call back for updates regarding that.   Patient could be contacted at [phone].  Please advise.

## 2020-10-14 NOTE — Telephone Encounter (Signed)
Called patient lvm, that referral has been placed, may take a few days for call.

## 2020-10-14 NOTE — Telephone Encounter (Signed)
Spoke with pt state that she has appointment scheduled in November with  GI

## 2020-10-21 ENCOUNTER — Ambulatory Visit: Payer: PPO | Admitting: Gastroenterology

## 2020-10-22 ENCOUNTER — Telehealth: Payer: Self-pay

## 2020-10-22 NOTE — Telephone Encounter (Signed)
Pt has been in office for acute visit; annual exam needed. Pt consents & appt made for 10/27/20 at 1045.

## 2020-10-27 ENCOUNTER — Ambulatory Visit (INDEPENDENT_AMBULATORY_CARE_PROVIDER_SITE_OTHER): Payer: PPO | Admitting: Family Medicine

## 2020-10-27 ENCOUNTER — Other Ambulatory Visit: Payer: Self-pay

## 2020-10-27 ENCOUNTER — Encounter: Payer: Self-pay | Admitting: Family Medicine

## 2020-10-27 VITALS — BP 118/64 | HR 56 | Temp 98.6°F | Ht 66.0 in

## 2020-10-27 DIAGNOSIS — E039 Hypothyroidism, unspecified: Secondary | ICD-10-CM

## 2020-10-27 DIAGNOSIS — Z0000 Encounter for general adult medical examination without abnormal findings: Secondary | ICD-10-CM | POA: Diagnosis not present

## 2020-10-27 DIAGNOSIS — I499 Cardiac arrhythmia, unspecified: Secondary | ICD-10-CM | POA: Diagnosis not present

## 2020-10-27 DIAGNOSIS — R001 Bradycardia, unspecified: Secondary | ICD-10-CM | POA: Diagnosis not present

## 2020-10-27 MED ORDER — LEVOTHYROXINE SODIUM 112 MCG PO TABS: 112.0000 ug | ORAL_TABLET | Freq: Every day | ORAL | 3 refills | Status: DC

## 2020-10-27 NOTE — Progress Notes (Signed)
Subjective:    Patient ID: Rachael [name], female    DOB: August 14, 1933, 87 y.o.   MRN: [id number]  HPI Here for a well exam. She feels great in general. Her only concern is urgency and occasional incontinence of her bowels. No pain or cramping. She wears protective undergarments most of the time now. She is scheduled to see Dr. Fuller Plan in GI on 11-27-20. She sees her GYN and has mammograms regularly.    Review of Systems  Constitutional: Negative.   HENT: Negative.    Eyes: Negative.   Respiratory: Negative.    Cardiovascular: Negative.   Gastrointestinal:  Positive for diarrhea.  Genitourinary:  Negative for decreased urine volume, difficulty urinating, dyspareunia, dysuria, enuresis, flank pain, frequency, hematuria, pelvic pain and urgency.  Musculoskeletal: Negative.   Skin: Negative.   Neurological: Negative.  Negative for headaches.  Psychiatric/Behavioral: Negative.        Objective:   Physical Exam Constitutional:      General: She is not in acute distress.    Appearance: She is well-developed.     Comments: Using a walker   HENT:     Head: Normocephalic and atraumatic.     Right Ear: External ear normal.     Left Ear: External ear normal.     Nose: Nose normal.     Mouth/Throat:     Pharynx: No oropharyngeal exudate.  Eyes:     General: No scleral icterus.    Conjunctiva/sclera: Conjunctivae normal.     Pupils: Pupils are equal, round, and reactive to light.  Neck:     Thyroid: No thyromegaly.     Vascular: No JVD.  Cardiovascular:     Heart sounds: Normal heart sounds. No murmur heard.   No friction rub. No gallop.     Comments: Heart rate is slow. On exam she drops every 3rd or 4th beat. The  EKG today shows sinus bradycardia with a rate of 52. QRS complexes are wide.  Pulmonary:     Effort: Pulmonary effort is normal. No respiratory distress.     Breath sounds: Normal breath sounds. No wheezing or rales.  Chest:     Chest wall: No tenderness.  Abdominal:      General: Bowel sounds are normal. There is no distension.     Palpations: Abdomen is soft. There is no mass.     Tenderness: There is no abdominal tenderness. There is no guarding or rebound.  Musculoskeletal:        General: No tenderness. Normal range of motion.     Cervical back: Normal range of motion and neck supple.  Lymphadenopathy:     Cervical: No cervical adenopathy.  Skin:    General: Skin is warm and dry.     Findings: No erythema or rash.  Neurological:     Mental Status: She is alert and oriented to person, place, and time.     Cranial Nerves: No cranial nerve deficit.     Motor: No abnormal muscle tone.     Coordination: Coordination normal.     Deep Tendon Reflexes: Reflexes are normal and symmetric. Reflexes normal.  Psychiatric:        Behavior: Behavior normal.        Thought Content: Thought content normal.        Judgment: Judgment normal.          Assessment & Plan:  Well exam. We discussed diet and exercise. Set up fasting labs soon. We will refer her  to Cardiology for bradycardia and apparent AV conduction delays.  Alysia Penna, MD

## 2020-10-28 ENCOUNTER — Other Ambulatory Visit (INDEPENDENT_AMBULATORY_CARE_PROVIDER_SITE_OTHER): Payer: PPO

## 2020-10-28 DIAGNOSIS — Z0000 Encounter for general adult medical examination without abnormal findings: Secondary | ICD-10-CM | POA: Diagnosis not present

## 2020-10-28 DIAGNOSIS — E039 Hypothyroidism, unspecified: Secondary | ICD-10-CM

## 2020-10-28 LAB — TSH: TSH: 0.55 u[IU]/mL (ref 0.35–5.50)

## 2020-10-28 LAB — HEPATIC FUNCTION PANEL
ALT: 11 U/L (ref 0–35)
AST: 20 U/L (ref 0–37)
Albumin: 4.2 g/dL (ref 3.5–5.2)
Alkaline Phosphatase: 65 U/L (ref 39–117)
Bilirubin, Direct: 0.2 mg/dL (ref 0.0–0.3)
Total Bilirubin: 1.1 mg/dL (ref 0.2–1.2)
Total Protein: 7 g/dL (ref 6.0–8.3)

## 2020-10-28 LAB — BASIC METABOLIC PANEL
BUN: 24 mg/dL — ABNORMAL HIGH (ref 6–23)
CO2: 26 mEq/L (ref 19–32)
Calcium: 10.1 mg/dL (ref 8.4–10.5)
Chloride: 105 mEq/L (ref 96–112)
Creatinine, Ser: 1.09 mg/dL (ref 0.40–1.20)
GFR: 46.53 mL/min — ABNORMAL LOW (ref >60.00)
Glucose, Bld: 95 mg/dL (ref 70–99)
Potassium: 4.1 mEq/L (ref 3.5–5.1)
Sodium: 142 mEq/L (ref 135–145)

## 2020-10-28 LAB — CBC WITH DIFFERENTIAL/PLATELET
Basophils Absolute: 0 10*3/uL (ref 0.0–0.1)
Basophils Relative: 0.5 % (ref 0.0–3.0)
Eosinophils Absolute: 0.3 10*3/uL (ref 0.0–0.7)
Eosinophils Relative: 3.2 % (ref 0.0–5.0)
HCT: 41.5 % (ref 36.0–46.0)
Hemoglobin: 13.8 g/dL (ref 12.0–15.0)
Lymphocytes Relative: 25.7 % (ref 12.0–46.0)
Lymphs Abs: 2 10*3/uL (ref 0.7–4.0)
MCHC: 33.4 g/dL (ref 30.0–36.0)
MCV: 88.5 fl (ref 78.0–100.0)
Monocytes Absolute: 0.9 10*3/uL (ref 0.1–1.0)
Monocytes Relative: 11.2 % (ref 3.0–12.0)
Neutro Abs: 4.7 10*3/uL (ref 1.4–7.7)
Neutrophils Relative %: 59.4 % (ref 43.0–77.0)
Platelets: 322 10*3/uL (ref 150.0–400.0)
RBC: 4.69 Mil/uL (ref 3.87–5.11)
RDW: 14.2 % (ref 11.5–15.5)
WBC: 7.9 10*3/uL (ref 4.0–10.5)

## 2020-10-28 LAB — LIPID PANEL
Cholesterol: 197 mg/dL (ref 0–200)
HDL: 78.5 mg/dL (ref >39.00)
LDL Cholesterol: 96 mg/dL (ref 0–99)
NonHDL: 118.58
Total CHOL/HDL Ratio: 3
Triglycerides: 113 mg/dL (ref 0.0–149.0)
VLDL: 22.6 mg/dL (ref 0.0–40.0)

## 2020-10-28 LAB — T3, FREE: T3, Free: 2.4 pg/mL (ref 2.3–4.2)

## 2020-10-28 LAB — HEMOGLOBIN A1C: Hgb A1c MFr Bld: 5.8 % (ref 4.6–6.5)

## 2020-10-28 LAB — T4, FREE: Free T4: 1.15 ng/dL (ref 0.60–1.60)

## 2020-10-31 ENCOUNTER — Other Ambulatory Visit: Payer: Self-pay | Admitting: Family Medicine

## 2020-11-04 ENCOUNTER — Telehealth: Payer: Self-pay | Admitting: Family Medicine

## 2020-11-04 NOTE — Telephone Encounter (Signed)
Patient called after hours line--states she has not heard back about her blood work.

## 2020-11-04 NOTE — Telephone Encounter (Signed)
Spoke with patient about lab results.

## 2020-11-10 ENCOUNTER — Emergency Department (HOSPITAL_COMMUNITY)
Admission: EM | Admit: 2020-11-10 | Discharge: 2020-11-10 | Disposition: A | Discharge status: Home / Self Care | Payer: PPO | Attending: Emergency Medicine | Admitting: Emergency Medicine

## 2020-11-10 ENCOUNTER — Emergency Department (HOSPITAL_COMMUNITY): Payer: PPO

## 2020-11-10 ENCOUNTER — Encounter (HOSPITAL_COMMUNITY): Payer: Self-pay | Admitting: Emergency Medicine

## 2020-11-10 DIAGNOSIS — I517 Cardiomegaly: Secondary | ICD-10-CM | POA: Diagnosis not present

## 2020-11-10 DIAGNOSIS — Z96643 Presence of artificial hip joint, bilateral: Secondary | ICD-10-CM | POA: Diagnosis not present

## 2020-11-10 DIAGNOSIS — R001 Bradycardia, unspecified: Secondary | ICD-10-CM | POA: Diagnosis not present

## 2020-11-10 DIAGNOSIS — R2243 Localized swelling, mass and lump, lower limb, bilateral: Secondary | ICD-10-CM | POA: Insufficient documentation

## 2020-11-10 DIAGNOSIS — E039 Hypothyroidism, unspecified: Secondary | ICD-10-CM | POA: Insufficient documentation

## 2020-11-10 DIAGNOSIS — Z7982 Long term (current) use of aspirin: Secondary | ICD-10-CM | POA: Diagnosis not present

## 2020-11-10 DIAGNOSIS — Z20822 Contact with and (suspected) exposure to covid-19: Secondary | ICD-10-CM | POA: Diagnosis not present

## 2020-11-10 DIAGNOSIS — N183 Chronic kidney disease, stage 3 unspecified: Secondary | ICD-10-CM | POA: Insufficient documentation

## 2020-11-10 DIAGNOSIS — Z79899 Other long term (current) drug therapy: Secondary | ICD-10-CM | POA: Insufficient documentation

## 2020-11-10 DIAGNOSIS — R0602 Shortness of breath: Secondary | ICD-10-CM | POA: Insufficient documentation

## 2020-11-10 DIAGNOSIS — Z853 Personal history of malignant neoplasm of breast: Secondary | ICD-10-CM | POA: Insufficient documentation

## 2020-11-10 DIAGNOSIS — R070 Pain in throat: Secondary | ICD-10-CM | POA: Insufficient documentation

## 2020-11-10 DIAGNOSIS — Z9011 Acquired absence of right breast and nipple: Secondary | ICD-10-CM | POA: Insufficient documentation

## 2020-11-10 LAB — COMPREHENSIVE METABOLIC PANEL
ALT: 14 U/L (ref 0–44)
AST: 22 U/L (ref 15–41)
Albumin: 4.3 g/dL (ref 3.5–5.0)
Alkaline Phosphatase: 65 U/L (ref 38–126)
Anion gap: 10 (ref 5–15)
BUN: 25 mg/dL — ABNORMAL HIGH (ref 8–23)
CO2: 24 mmol/L (ref 22–32)
Calcium: 10 mg/dL (ref 8.9–10.3)
Chloride: 104 mmol/L (ref 98–111)
Creatinine, Ser: 0.94 mg/dL (ref 0.44–1.00)
GFR, Estimated: 60 mL/min — ABNORMAL LOW (ref >60)
Glucose, Bld: 101 mg/dL — ABNORMAL HIGH (ref 70–99)
Potassium: 4 mmol/L (ref 3.5–5.1)
Sodium: 138 mmol/L (ref 135–145)
Total Bilirubin: 1.8 mg/dL — ABNORMAL HIGH (ref 0.3–1.2)
Total Protein: 7.3 g/dL (ref 6.5–8.1)

## 2020-11-10 LAB — CBC WITH DIFFERENTIAL/PLATELET
Abs Immature Granulocytes: 0.04 10*3/uL (ref 0.00–0.07)
Basophils Absolute: 0.1 10*3/uL (ref 0.0–0.1)
Basophils Relative: 1 %
Eosinophils Absolute: 0.3 10*3/uL (ref 0.0–0.5)
Eosinophils Relative: 3 %
HCT: 45.4 % (ref 36.0–46.0)
Hemoglobin: 14.7 g/dL (ref 12.0–15.0)
Immature Granulocytes: 0 %
Lymphocytes Relative: 26 %
Lymphs Abs: 2.8 10*3/uL (ref 0.7–4.0)
MCH: 29.3 pg (ref 26.0–34.0)
MCHC: 32.4 g/dL (ref 30.0–36.0)
MCV: 90.4 fL (ref 80.0–100.0)
Monocytes Absolute: 1 10*3/uL (ref 0.1–1.0)
Monocytes Relative: 9 %
Neutro Abs: 6.5 10*3/uL (ref 1.7–7.7)
Neutrophils Relative %: 61 %
Platelets: 292 10*3/uL (ref 150–400)
RBC: 5.02 MIL/uL (ref 3.87–5.11)
RDW: 14.3 % (ref 11.5–15.5)
WBC: 10.7 10*3/uL — ABNORMAL HIGH (ref 4.0–10.5)
nRBC: 0 % (ref 0.0–0.2)

## 2020-11-10 LAB — RESP PANEL BY RT-PCR (FLU A&B, COVID) ARPGX2
Influenza A by PCR: NEGATIVE
Influenza B by PCR: NEGATIVE
SARS Coronavirus 2 by RT PCR: NEGATIVE

## 2020-11-10 LAB — TROPONIN I (HIGH SENSITIVITY)
Troponin I (High Sensitivity): 26 ng/L — ABNORMAL HIGH (ref <18)
Troponin I (High Sensitivity): 26 ng/L — ABNORMAL HIGH (ref <18)

## 2020-11-10 LAB — BRAIN NATRIURETIC PEPTIDE: B Natriuretic Peptide: 379.9 pg/mL — ABNORMAL HIGH (ref 0.0–100.0)

## 2020-11-10 NOTE — ED Notes (Signed)
Walked patient and oxygen saturation dropped to 92% heart rate stayed in the 60's

## 2020-11-10 NOTE — ED Provider Notes (Signed)
[facility] DEPT Provider Note   CSN: [id number] Arrival date & time: 11/10/20  1205     History Chief Complaint  Patient presents with   Shortness of Breath    Rachael [name] is a 87 y.o. female.  Pt presents to the ED today with sob.  Pt said it happens periodically.  She said it's been going on for a week.  She said she does not feel bad.  She does feel a little scratchy in her throat.  No fevers.  She has not been vaccinated for covid or flu.  She has been referred to cardiology for what she thinks is something wrong with her ventricle.  When looking at Dr. Barbie Banner note, she was referred due to bradycardia and ? AV conduction delays.  It looks like that appt is not until 12/2 (scheduled with Dr. Johnsie Cancel)      Past Medical History:  Diagnosis Date   Breast cancer ([facility])    R   CAROTID STENOSIS    Chronic venous insufficiency    CONTACT DERMATITIS    DUPUYTREN'S CONTRACTURE    Edema    pt  states had edema of feet and legs has been on lasix per dr. to help   GLUCOSE INTOLERANCE    HLD (hyperlipidemia)    Hypothyroidism    Dr. Ala Dach in [place]    Obesity    Osteoarthritis    bilat hips, severe   PVD (peripheral vascular disease) ([facility])    mild carotid 11/05   VITAMIN D DEFICIENCY     Patient Active Problem List   Diagnosis Date Noted   Cellulitis of right leg 11/14/2019   Acquired hypothyroidism 07/18/2019   Weight gain 07/18/2019   OAB (overactive bladder) 04/23/2019   Acute encephalopathy 02/25/2018   Bradycardia 02/25/2018   TIA (transient ischemic attack) 02/24/2018   Carotid artery stenosis 02/24/2018   Malignant neoplasm of lower-inner quadrant of right breast of female, estrogen receptor positive ([facility]) 03/28/2017   PMR (polymyalgia rheumatica) ([facility]) 11/11/2016   Spinal stenosis, lumbar 08/26/2016   SIRS (systemic inflammatory response syndrome) ([facility])    Weakness of both lower extremities     Arthritis    Gout attack 10/23/2014   Anemia in neoplastic disease 04/02/2014   CKD (chronic kidney disease), stage III ([facility]) 04/02/2014   Acute renal failure ([facility]) 03/18/2014   Leg edema, right    Breast cancer, right breast ([facility]) 05/13/2011   Incisional hernia, incarcerated, right upper quadrant 07/13/2010   HEADACHE 11/06/2009   FATIGUE 06/13/2009   EDEMA 05/01/2008   Backache 03/29/2008   CAROTID STENOSIS 03/16/2008   Anxiety state 11/14/2007   MENOPAUSAL SYNDROME 09/27/2007   GLUCOSE INTOLERANCE 08/10/2007   Dyslipidemia 08/10/2007   PERIPHERAL VASCULAR DISEASE 08/10/2007   Venous (peripheral) insufficiency 08/10/2007   Vitamin D deficiency 12/26/2006   OBESITY 12/26/2006   Hypothyroidism 11/05/2006   Osteoarthritis 11/05/2006    Past Surgical History:  Procedure Laterality Date   2D echo  11/29/2003   normal LV size. normal EF    BACK SURGERY  12/06/2016   L3-L5 laminectomies per Dr. Maryellen Pile at [facility]  05/11/2011   right breast   BREAST LUMPECTOMY     right breast   CHOLECYSTECTOMY     FINGER SURGERY     HERNIA REPAIR     incisional hernia   INGUINAL HERNIA REPAIR     JOINT REPLACEMENT Bilateral  Dr. Maureen Ralphs   normal caronary arteries     per pt. 2003   right rotator cuff surgery     [facility]  05/2008 and 10/2008   B hip - alusio     OB History   No obstetric history on file.     Family History  Problem Relation Age of Onset   Uterine cancer Mother 49   Cancer Mother    Breast cancer Daughter 63       BRCA negative (no BART)   Cancer Daughter    Pancreatic cancer Maternal Grandfather        diagnosed in his 69s   Prostate cancer Maternal Uncle        diagnosed in his 66s   Colon cancer Cousin    Prostate cancer Paternal Uncle        diagnosed in his 22s   Clotting disorder Maternal Grandmother    Leukemia Cousin    Heart disease Father        before age 69    Varicose Veins Father    Heart disease Brother        before age 40   Coronary artery disease Other        family hx - female 1st degree relative <60   Hypothyroidism Other        aunt    Uterine cancer Cousin    Prostate cancer Cousin    Cancer Sister    Anesthesia problems Neg Hx    Adrenal disorder Neg Hx     Social History   Tobacco Use   Smoking status: Never   Smokeless tobacco: Never  Substance Use Topics   Alcohol use: No    Alcohol/week: 0.0 standard drinks   Drug use: No    Home Medications Prior to Admission medications   Medication Sig Start Date End Date Taking? Authorizing Provider  aspirin EC 81 MG EC tablet Take 1 tablet (81 mg total) by mouth daily. 02/27/18   Black, Lezlie Octave, NP  Cholecalciferol (VITAMIN D3 PO) Take 1 tablet by mouth daily.    [provider]  Cyanocobalamin (B-12 PO) Take 1 tablet by mouth daily.     [provider]  levothyroxine (SYNTHROID) 112 MCG tablet Take 1 tablet (112 mcg total) by mouth daily before breakfast. 10/27/20   [name], MD  Nutritional Supplements (JUICE PLUS FIBRE PO) Juice Plus    [provider]  predniSONE (DELTASONE) 5 MG tablet Take 1 tablet (5 mg total) by mouth daily with breakfast. 04/29/20   [name], MD    Allergies    Prednisone, Shellfish allergy, Percocet [oxycodone-acetaminophen], Iodinated diagnostic agents, and Iodine  Review of Systems   Review of Systems  Respiratory:  Positive for shortness of breath.   All other systems reviewed and are negative.  Physical Exam Updated Vital Signs BP 121/64   Pulse (!) 50   Temp 98.4 F (36.9 C) (Oral)   Resp 14   SpO2 97%   Physical Exam Vitals and nursing note reviewed.  Constitutional:      Appearance: She is well-developed. She is obese.  HENT:     Head: Normocephalic and atraumatic.     Mouth/Throat:     Mouth: Mucous membranes are moist.     Pharynx: Oropharynx is clear.  Eyes:     Extraocular  Movements: Extraocular movements intact.     Pupils:  Pupils are equal, round, and reactive to light.  Cardiovascular:     Rate and Rhythm: Regular rhythm. Bradycardia present.  Pulmonary:     Effort: Pulmonary effort is normal.     Breath sounds: Normal breath sounds.  Abdominal:     General: Bowel sounds are normal.     Palpations: Abdomen is soft.  Musculoskeletal:        General: Normal range of motion.     Cervical back: Normal range of motion and neck supple.     Right lower leg: Edema present.     Left lower leg: Edema present.  Skin:    General: Skin is warm.     Capillary Refill: Capillary refill takes less than 2 seconds.  Neurological:     General: No focal deficit present.     Mental Status: She is alert and oriented to person, place, and time.  Psychiatric:        Mood and Affect: Mood normal.        Behavior: Behavior normal.    ED Results / Procedures / Treatments   Labs (all labs ordered are listed, but only abnormal results are displayed) Labs Reviewed  CBC WITH DIFFERENTIAL/PLATELET - Abnormal; Notable for the following components:      Result Value   WBC 10.7 (*)    All other components within normal limits  COMPREHENSIVE METABOLIC PANEL - Abnormal; Notable for the following components:   Glucose, Bld 101 (*)    BUN 25 (*)    Total Bilirubin 1.8 (*)    GFR, Estimated 60 (*)    All other components within normal limits  BRAIN NATRIURETIC PEPTIDE - Abnormal; Notable for the following components:   B Natriuretic Peptide 379.9 (*)    All other components within normal limits  TROPONIN I (HIGH SENSITIVITY) - Abnormal; Notable for the following components:   Troponin I (High Sensitivity) 26 (*)    All other components within normal limits  TROPONIN I (HIGH SENSITIVITY) - Abnormal; Notable for the following components:   Troponin I (High Sensitivity) 26 (*)    All other components within normal limits  RESP PANEL BY RT-PCR (FLU A&B, COVID) [id number]     EKG EKG Interpretation  Date/Time:  Monday November 10 2020 13:16:34 EDT Ventricular Rate:  40 PR Interval:  161 QRS Duration: 108 QT Interval:  443 QTC Calculation: 362 R Axis:   73 Text Interpretation: Sinus bradycardia Atrial premature complex Minimal ST depression, lateral leads No significant change since last tracing Confirmed by Isla Pence [phone]) on 11/10/2020 7:21:33 PM  Radiology DG Chest 2 View  Result Date: 11/10/2020 CLINICAL DATA:  Shortness of breath beginning 1 week ago. EXAM: CHEST - 2 VIEW COMPARISON:  12/25/2018 FINDINGS: Mild cardiomegaly and aortic tortuosity. The lungs are clear. No evidence of heart failure, infiltrate, collapse or effusion. IMPRESSION: Mild cardiomegaly and aortic tortuosity. No active disease otherwise. Electronically Signed   By: Nelson Chimes M.D.   On: 11/10/2020 12:55    Procedures Procedures   Medications Ordered in ED Medications - No data to display  ED Course  I have reviewed the triage vital signs and the nursing notes.  Pertinent labs & imaging results that were available during my care of the patient were reviewed by me and considered in my medical decision making (see chart for details).    MDM Rules/Calculators/A&P  Pt is able to ambulate with O2 sat 92%.  HR in the 60s. No other cause for sob is found today other than possibly the HR dropping.  Pt is not on any meds which would cause this.  I did speak with the cards fellow to see if we could pt in to get a Zio patch or see cards sooner.  He said he will try to arrange.  Pt is stable for d/c.  She is to f/u with cards.  Return if worse.    Final Clinical Impression(s) / ED Diagnoses Final diagnoses:  Bradycardia    Rx / DC Orders ED Discharge Orders     None        Isla Pence, MD 11/10/20 2149

## 2020-11-10 NOTE — ED Notes (Signed)
Pt care taken, no complaints at this time. 

## 2020-11-10 NOTE — ED Provider Notes (Addendum)
Emergency Medicine Provider Triage Evaluation Note  Rachael [name] , a 87 y.o. female  was evaluated in triage.  Pt complains of shortness of breath.  Started a week ago, worsened today.  Its constant, feels like she cannot take deep breaths.  No chest pain.  Has had associated fatigue this week as well.  Does not smoke cigarettes, no history of COPD or asthma.  Referred to cardiology due to an abnormal EKG, first appointment in January no history of heart failure..  Endorses swelling in her legs bilaterally, wears compression stockings due to venous insufficiency.  Review of Systems  Positive: Shortness of breath, fatigue Negative: Chest pain, nausea, vomiting, cough, fever  Physical Exam  BP (!) 167/78 (BP Location: Left Arm)   Pulse 66   Temp 98.4 F (36.9 C) (Oral)   Resp 18   SpO2 99%  Gen:   Awake, no distress   Resp:  Normal effort-satting at 100%.  No wheezing auscultated MSK:   Moves extremities without difficulty  Other:  Bradycardia  Medical Decision Making  Medically screening exam initiated at 12:39 PM.  Appropriate orders placed.  [name] was informed that the remainder of the evaluation will be completed by another provider, this initial triage assessment does not replace that evaluation, and the importance of remaining in the ED until their evaluation is complete.  Shortness of breath work-up   Sherrill Raring, PA-C 11/10/20 1241    Sherrill Raring, PA-C 11/10/20 1242    Varney Biles, MD 11/11/20 1609

## 2020-11-10 NOTE — Discharge Instructions (Signed)
Follow up in the cardiology office.  It is recommended that you get a Zio patch which they can set up.

## 2020-11-10 NOTE — ED Triage Notes (Signed)
Per pt, states she is having trouble breathing-states she was recently seen by PCP and they found something wrong with one of her ventricles-has an appointment with a cardiologist in December-denies pain-appears to be having some anxiety associated with her symptoms

## 2020-11-11 ENCOUNTER — Ambulatory Visit: Payer: PPO | Admitting: Adult Health

## 2020-11-11 ENCOUNTER — Telehealth: Payer: Self-pay | Admitting: Cardiovascular Disease

## 2020-11-11 NOTE — Telephone Encounter (Signed)
Spoke to the patient who sounds like she is very anxious. Stating that the doctor at the hospital wanted her to be seen at our office today and she was concerned because no one has called her and it is after lunch.  Reviewed ED visit and discussed test results for reassurance. Used calming techniques and the patient slowly calmed down.   From ED note: Pt is able to ambulate with O2 sat 92%.  HR in the 60s. No other cause for sob is found today other than possibly the HR dropping.  Pt is not on any meds which would cause this.  I did speak with the cards fellow to see if we could pt in to get a Zio patch or see cards sooner.  He said he will try to arrange.  Open new patient appointment 11/12/20 with Dr. Rico Ala offered to patient.  Patient thankful and accepted sooner appt. ED precautions given.  Verbalized understanding.

## 2020-11-11 NOTE — Telephone Encounter (Signed)
New Message:      Patient was seen in [facility] yesterday for Shortness of breath and low heart rate. See noted uin Epic. She said the doctor was supposed to call and talked to somebody here, because she wanted herr seen today.    Pt c/o Shortness Of Breath: STAT if SOB developed within the last 24 hours or pt is noticeably SOB on the phone  1. Are you currently SOB (can you hear that pt is SOB on the phone)? She says it is hard for her to breathe   2. How long have you been experiencing SOB? A week- yesterday it really got bad, she went to the ER  3. Are you SOB when sitting or when up moving around? Both  4. Are you currently experiencing any other symptoms? Low heart rate , last night it was 42

## 2020-11-12 ENCOUNTER — Ambulatory Visit: Payer: PPO | Admitting: Internal Medicine

## 2020-11-12 ENCOUNTER — Other Ambulatory Visit: Payer: Self-pay

## 2020-11-12 ENCOUNTER — Encounter: Payer: Self-pay | Admitting: Internal Medicine

## 2020-11-12 ENCOUNTER — Ambulatory Visit: Payer: PPO

## 2020-11-12 VITALS — BP 130/68 | HR 50 | Ht 66.0 in | Wt 184.0 lb

## 2020-11-12 DIAGNOSIS — R001 Bradycardia, unspecified: Secondary | ICD-10-CM

## 2020-11-12 DIAGNOSIS — R0600 Dyspnea, unspecified: Secondary | ICD-10-CM

## 2020-11-12 DIAGNOSIS — I6523 Occlusion and stenosis of bilateral carotid arteries: Secondary | ICD-10-CM

## 2020-11-12 DIAGNOSIS — E785 Hyperlipidemia, unspecified: Secondary | ICD-10-CM

## 2020-11-12 MED ORDER — FUROSEMIDE 20 MG PO TABS: 20.0000 mg | ORAL_TABLET | Freq: Two times a day (BID) | ORAL | 3 refills | Status: DC

## 2020-11-12 MED ORDER — ATORVASTATIN CALCIUM 40 MG PO TABS: 40.0000 mg | ORAL_TABLET | Freq: Every day | ORAL | 3 refills | Status: DC

## 2020-11-12 NOTE — Assessment & Plan Note (Addendum)
She should continue aspirin.  Given the presence of mild carotid disease, her LDL goal is less than 70, we will start atorvastatin 40 mg and check a lipid panel and CMP in 6 weeks time.

## 2020-11-12 NOTE — Assessment & Plan Note (Addendum)
This is being followed by her primary care provider.  Given the presence of mild carotid artery disease her LDL goal is less than 70.  Her most recent LDL was 96.  We will start atorvastatin 40 mg.

## 2020-11-12 NOTE — Patient Instructions (Addendum)
Medication Instructions:  Your physician has recommended you make the following change in your medication:  1.) start furosemide (Lasix) 20 mg - take one tablet twice a day 2.) start atorvastatin (Lipitor) 40 mg - take one tablet daily In one week please call or use MyChart to let Dr. Ali Lowe know how your breathing is on Lasix. *If you need a refill on your cardiac medications before your next appointment, please call your pharmacy*   Lab Work: Please return for BMET in one week Please return in 3 months for lipids/liver function (blood work)  If you have labs (blood work) drawn today and your tests are completely normal, you will receive your results only by: [facility] (if you have MyChart) OR A paper copy in the mail If you have any lab test that is abnormal or we need to change your treatment, we will call you to review the results.   Testing/Procedures: Your physician has requested that you have an echocardiogram. Echocardiography is a painless test that uses sound waves to create images of your heart. It provides your doctor with information about the size and shape of your heart and how well your heart's chambers and valves are working. This procedure takes approximately one hour. There are no restrictions for this procedure.   Follow-Up: At [facility], you and your health needs are our priority.  As part of our continuing mission to provide you with exceptional heart care, we have created designated Provider Care Teams.  These Care Teams include your primary Cardiologist (physician) and Advanced Practice Providers (APPs -  Physician Assistants and Nurse Practitioners) who all work together to provide you with the care you need, when you need it.   Your next appointment:   3 month(s)  The format for your next appointment:   In Person  Provider:   Lenna Sciara, MD   Other Instructions

## 2020-11-12 NOTE — Assessment & Plan Note (Signed)
We will place a Zio patch but given her sinus bradycardia which has persisted for the last several years and her symptoms I believe an EP opinion would be important here and she may be a candidate for permanent pacemaker given symptomatic bradycardia.  I will obtain an echocardiogram as well.

## 2020-11-12 NOTE — Assessment & Plan Note (Addendum)
I will obtain an echocardiogram.  It is certainly possible of the patient's dyspnea is due to diastolic dysfunction.  We will start Lasix 20 mg twice daily.  I have asked her to contact our office in a week's time to see if this has improved her breathing.  We will also place a monitor on to look for symptomatic bradycardia.  If the studies are unremarkable we will proceed to a CTA to evaluate for obstructive coronary artery disease.  The other possibility here is this is residua from her coronavirus infection.  She was hospitalized in December 2021 with low saturations.  If we cannot establish a diagnosis after this collection of testing we may proceed to a right heart catheterization.  I will see her back in 3 months or earlier if needed.

## 2020-11-12 NOTE — Progress Notes (Unsigned)
Applied a 7 day Zio XT monitor to patient in the office 

## 2020-11-12 NOTE — Progress Notes (Signed)
Cardiology Office Note:    Date:  11/12/2020   ID:  Rachael [name], DOB Jun 23, 1933, MRN [id number]  PCP:  [name], MD   [facility] Providers Cardiologist:  None     Referring MD: [name], MD   Chief Complaint:  Dyspnea  History of Present Illness:    PROBLEM LIST: 1.  Mild carotid stenosis  2.  Hyperlipidemia 3.  Hypothyroidism 4.  Iodine allergy 5.  Unvaccinated for COVID; hospitalized for COVID 12/2019   Rachael [name] is a 87 y.o. female with the indicated medical problems here for recommendations regarding shortness of breath.  The patient recently presented to the emergency department with shortness of breath.  Laboratory evaluation was relatively unremarkable with a high sensitive troponin of around 26 and a moderately elevated BNP of 380.  Her EKG demonstrated sinus bradycardia.  She was eventually discharged home with plans for outpatient cardiology evaluation. Previous to this she was seen by her PCP.  Her EKGs of the last few years have shown sinus bradycardia.  A TSH was drawn within the month which was within normal limits.  She is accompanied here today with her son.  She tells me that she did all of her usual activities on Saturday.  Then a few days later when she was sitting at her table she became acutely short of breath.  She tells me she could not take a deep breath.  She denies any antecedent chest pain or palpitations.  She went to the emergency department where an EKG demonstrated sinus bradycardia.  Her other laboratories were within normal limits.  Her chest x-ray showed no evidence of infection or pulmonary edema.  She was ultimately discharged.  She tells me that she has had some orthopnea yesterday.  She denies any palpitations.  She denies any presyncope or syncope.   Previous Medical/Surgical History:    Past Medical History:  Diagnosis Date   Breast cancer ([name])    R   CAROTID STENOSIS    Chronic venous insufficiency    CONTACT  DERMATITIS    DUPUYTREN'S CONTRACTURE    Edema    pt  states had edema of feet and legs has been on lasix per dr. to help   GLUCOSE INTOLERANCE    HLD (hyperlipidemia)    Hypothyroidism    Dr. Ala Dach in [place]    Obesity    Osteoarthritis    bilat hips, severe   PVD (peripheral vascular disease) ([place])    mild carotid 11/05   VITAMIN D DEFICIENCY     Past Surgical History:  Procedure Laterality Date   2D echo  11/29/2003   normal LV size. normal EF    BACK SURGERY  12/06/2016   L3-L5 laminectomies per Dr. Maryellen Pile at [place]  05/11/2011   right breast   BREAST LUMPECTOMY     right breast   [place]     incisional hernia   INGUINAL HERNIA REPAIR     JOINT REPLACEMENT Bilateral    Dr. Maureen Ralphs   normal caronary arteries     per pt. 2003   right rotator cuff surgery     SPINE SURGERY     TONSILLECTOMY     TOTAL HIP ARTHROPLASTY  05/2008 and 10/2008   B hip - alusio    Current Medications: Current Meds  Medication Sig  aspirin EC 81 MG EC tablet Take 1 tablet (81 mg total) by mouth daily.   atorvastatin (LIPITOR) 40 MG tablet Take 1 tablet (40 mg total) by mouth daily.   Cholecalciferol (VITAMIN D3 PO) Take 250 mg by mouth 4 (four) times daily.   Cyanocobalamin (B-12 PO) Take 1 tablet by mouth daily.    furosemide (LASIX) 20 MG tablet Take 1 tablet (20 mg total) by mouth 2 (two) times daily.   levothyroxine (SYNTHROID) 112 MCG tablet Take 1 tablet (112 mcg total) by mouth daily before breakfast.   Nutritional Supplements (JUICE PLUS FIBRE PO) Juice Plus   predniSONE (DELTASONE) 5 MG tablet Take 1 tablet (5 mg total) by mouth daily with breakfast.     Allergies:   Prednisone, Shellfish allergy, Percocet [oxycodone-acetaminophen], Iodinated diagnostic agents, and Iodine   Social History:    Social History   Socioeconomic History   Marital status: Divorced     Spouse name: Not on file   Number of children: 5   Years of education: 1 year college   Highest education level: High school graduate  Occupational History   Not on file  Tobacco Use   Smoking status: Never   Smokeless tobacco: Never  Substance and Sexual Activity   Alcohol use: No    Alcohol/week: 0.0 standard drinks   Drug use: No   Sexual activity: Not Currently  Other Topics Concern   Not on file  Social History Narrative   Divorced; works part time   5 children   Social Determinants of Radio broadcast assistant Strain: Low Risk    Difficulty of Paying Living Expenses: Not hard at all  Food Insecurity: No Food Insecurity   Worried About Charity fundraiser in the Last Year: Never true   Arboriculturist in the Last Year: Never true  Transportation Needs: No Transportation Needs   Lack of Transportation (Medical): No   Lack of Transportation (Non-Medical): No  Physical Activity: Sufficiently Active   Days of Exercise per Week: 3 days   Minutes of Exercise per Session: 60 min  Stress: No Stress Concern Present   Feeling of Stress : Not at all  Social Connections: Moderately Isolated   Frequency of Communication with Friends and Family: More than three times a week   Frequency of Social Gatherings with Friends and Family: More than three times a week   Attends Religious Services: More than 4 times per year   Active Member of [employer] or Organizations: No   Attends Music therapist: Never   Marital Status: Divorced     Family History:  The patient's family history includes Breast cancer (age of onset: 86) in her daughter; Cancer in her daughter, mother, and sister; Clotting disorder in her maternal grandmother; Colon cancer in her cousin; Coronary artery disease in an other family member; Heart disease in her brother and father; Hypothyroidism in an other family member; Leukemia in her cousin; Pancreatic cancer in her maternal grandfather; Prostate cancer in  her cousin, maternal uncle, and paternal uncle; Uterine cancer in her cousin; Uterine cancer (age of onset: 99) in her mother; Varicose Veins in her father. There is no history of Anesthesia problems or Adrenal disorder.  ROS:  Please see the history of present illness.  All other systems reviewed and are negative.  EKGs/Labs/Other Studies Reviewed:    The following studies were reviewed today: I reviewed her echocardiogram from 2020 which demonstrated normal LV function with no significant  valve abnormalities. I viewed EKGs from last several years which demonstrate sinus bradycardia.  I reviewed her recent labs which were relatively unremarkable notably with a normal TSH and mildly elevated high-sensitivity troponin.  EKG:   EKG done yesterday demonstrates likely sinus bradycardia with artifact  Recent Labs: 10/28/2020: TSH 0.55 11/10/2020: ALT 14; B Natriuretic Peptide 379.9; BUN 25; Creatinine, Ser 0.94; Hemoglobin 14.7; Platelets 292; Potassium 4.0; Sodium 138   Recent Lipid Panel    Component Value Date/Time   CHOL 197 10/28/2020 0952   TRIG 113.0 10/28/2020 0952   HDL 78.50 10/28/2020 0952   CHOLHDL 3 10/28/2020 0952   VLDL 22.6 10/28/2020 0952   LDLCALC 96 10/28/2020 0952   LDLDIRECT 115.8 11/03/2011 1329     Risk Assessment/Calculations:           Physical Exam:    VS:  BP 130/68   Pulse (!) 50   Ht 5\' 6"  (1.676 m)   Wt 184 lb (83.5 kg) Comment: PT STATED WEIGHT  SpO2 96%   BMI 29.70 kg/m     Wt Readings from Last 3 Encounters:  11/12/20 184 lb (83.5 kg)  05/13/20 220 lb 0.3 oz (99.8 kg)  02/20/20 220 lb (99.8 kg)     GEN:  Frail, in no acute distress HEENT: Normal NECK: No JVD; No carotid bruits LYMPHATICS: No lymphadenopathy CARDIAC: RRR, no murmurs, rubs, gallops RESPIRATORY:  Clear to auscultation without rales, wheezing or rhonchi  ABDOMEN: Soft, non-tender, non-distended MUSCULOSKELETAL:  +1 edema; No deformity  SKIN: Warm and dry NEUROLOGIC:   Alert and oriented x 3 PSYCHIATRIC:  Normal affect   ASSESSMENT and PLAN   Dyspnea I will obtain an echocardiogram.  It is certainly possible of the patient's dyspnea is due to diastolic dysfunction.  We will start Lasix 20 mg twice daily.  I have asked her to contact our office in a week's time to see if this has improved her breathing.  We will also place a monitor on to look for symptomatic bradycardia.  If the studies are unremarkable we will proceed to a CTA to evaluate for obstructive coronary artery disease.  The other possibility here is this is residua from her coronavirus infection.  She was hospitalized in December 2021 with low saturations.  If we cannot establish a diagnosis after this collection of testing we may proceed to a right heart catheterization.  I will see her back in 3 months or earlier if needed.  Dyslipidemia This is being followed by her primary care provider.  Given the presence of mild carotid artery disease her LDL goal is less than 70.  Her most recent LDL was 96.  We will start atorvastatin 40 mg.  Carotid artery stenosis She should continue aspirin.  Given the presence of mild carotid disease, her LDL goal is less than 70, we will start atorvastatin 40 mg and check a lipid panel and CMP in 6 weeks time.  Bradycardia We will place a Zio patch but given her sinus bradycardia which has persisted for the last several years and her symptoms I believe an EP opinion would be important here and she may be a candidate for permanent pacemaker given symptomatic bradycardia.  I will obtain an echocardiogram as well.              Medication Adjustments/Labs and Tests Ordered: Current medicines are reviewed at length with the patient today.  Concerns regarding medicines are outlined above.  Orders Placed This Encounter  Procedures  Lipid Profile   Hepatic function panel   Basic metabolic panel   LONG TERM MONITOR (3-14 DAYS)   ECHOCARDIOGRAM COMPLETE    Meds  ordered this encounter  Medications   furosemide (LASIX) 20 MG tablet    Sig: Take 1 tablet (20 mg total) by mouth 2 (two) times daily.    Dispense:  180 tablet    Refill:  3   atorvastatin (LIPITOR) 40 MG tablet    Sig: Take 1 tablet (40 mg total) by mouth daily.    Dispense:  90 tablet    Refill:  3     Patient Instructions  Medication Instructions:  Your physician has recommended you make the following change in your medication:  1.) start furosemide (Lasix) 20 mg - take one tablet twice a day 2.) start atorvastatin (Lipitor) 40 mg - take one tablet daily In one week please call or use MyChart to let Dr. Ali Lowe know how your breathing is on Lasix. *If you need a refill on your cardiac medications before your next appointment, please call your pharmacy*   Lab Work: Please return for BMET in one week Please return in 3 months for lipids/liver function (blood work)  If you have labs (blood work) drawn today and your tests are completely normal, you will receive your results only by: [place] (if you have MyChart) OR A paper copy in the mail If you have any lab test that is abnormal or we need to change your treatment, we will call you to review the results.   Testing/Procedures: Your physician has requested that you have an echocardiogram. Echocardiography is a painless test that uses sound waves to create images of your heart. It provides your doctor with information about the size and shape of your heart and how well your heart's chambers and valves are working. This procedure takes approximately one hour. There are no restrictions for this procedure.   Follow-Up: At [facility], you and your health needs are our priority.  As part of our continuing mission to provide you with exceptional heart care, we have created designated Provider Care Teams.  These Care Teams include your primary Cardiologist (physician) and Advanced Practice Providers (APPs -  Physician  Assistants and Nurse Practitioners) who all work together to provide you with the care you need, when you need it.   Your next appointment:   3 month(s)  The format for your next appointment:   In Person  Provider:   Lenna Sciara, MD   Other Instructions Bryn Gulling- Long Term Monitor Instructions  Your physician has requested you wear a ZIO patch monitor for 14 days.  This is a single patch monitor. [employer] supplies one patch monitor per enrollment. Additional stickers are not available. Please do not apply patch if you will be having a Nuclear Stress Test,  Echocardiogram, Cardiac CT, MRI, or Chest Xray during the period you would be wearing the  monitor. The patch cannot be worn during these tests. You cannot remove and re-apply the  ZIO XT patch monitor.  Your ZIO patch monitor will be mailed 3 day USPS to your address on file. It may take 3-5 days  to receive your monitor after you have been enrolled.  Once you have received your monitor, please review the enclosed instructions. Your monitor  has already been registered assigning a specific monitor serial # to you.  Billing and Patient Assistance Program Information  We have supplied [employer] with any of your insurance information on  file for billing purposes. [employer] offers a sliding scale Patient Assistance Program for patients that do not have  insurance, or whose insurance does not completely cover the cost of the ZIO monitor.  You must apply for the Patient Assistance Program to qualify for this discounted rate.  To apply, please call [employer] at [phone], select option 4, select option 2, ask to apply for  Patient Assistance Program. Theodore Demark will ask your household income, and how many people  are in your household. They will quote your out-of-pocket cost based on that information.  [employer] will also be able to set up a 27-month, interest-free payment plan if needed.  Applying the monitor   Shave hair from upper left  chest.  Hold abrader disc by orange tab. Rub abrader in 40 strokes over the upper left chest as  indicated in your monitor instructions.  Clean area with 4 enclosed alcohol pads. Let dry.  Apply patch as indicated in monitor instructions. Patch will be placed under collarbone on left  side of chest with arrow pointing upward.  Rub patch adhesive wings for 2 minutes. Remove white label marked "1". Remove the white  label marked "2". Rub patch adhesive wings for 2 additional minutes.  While looking in a mirror, press and release button in center of patch. A small green light will  flash 3-4 times. This will be your only indicator that the monitor has been turned on.  Do not shower for the first 24 hours. You may shower after the first 24 hours.  Press the button if you feel a symptom. You will hear a small click. Record Date, Time and  Symptom in the Patient Logbook.  When you are ready to remove the patch, follow instructions on the last 2 pages of Patient  Logbook. Stick patch monitor onto the last page of Patient Logbook.  Place Patient Logbook in the blue and white box. Use locking tab on box and tape box closed  securely. The blue and white box has prepaid postage on it. Please place it in the mailbox as  soon as possible. Your physician should have your test results approximately 7 days after the  monitor has been mailed back to [facility].  Call [facility] at [phone] if you have questions regarding  your ZIO XT patch monitor. Call them immediately if you see an orange light blinking on your  monitor.  If your monitor falls off in less than 4 days, contact our Monitor department at [phone].  If your monitor becomes loose or falls off after 4 days call [employer] at [phone] for  suggestions on securing your monitor     Signed, Early Osmond, MD  11/12/2020 9:59 AM    [place]

## 2020-11-13 ENCOUNTER — Telehealth: Payer: Self-pay | Admitting: Internal Medicine

## 2020-11-13 NOTE — Telephone Encounter (Signed)
Per pt is not tolerating the Furosemide up every 10 minutes last night urinating and finally just stayed up Also has continued with urinating every 10 minutes throughout today Per pt has taken a total of 3 doses  Pt states no burning with urinating  and color is clear Will forward to Dr Konrad Felix for review and recommendations ./cy

## 2020-11-13 NOTE — Telephone Encounter (Signed)
Pt c/o medication issue:  1. Name of Medication: furosemide (LASIX) 20 MG tablet  2. How are you currently taking this medication (dosage and times per day)? 1 tablet twice a day  3. Are you having a reaction (difficulty breathing--STAT)? no  4. What is your medication issue? Patient states she has been going to the bathroom every 10 minutes. She states cannot even go for a walk outside. She says she stopped drinking water this morning, but is still going to the bathroom. She also says she was up all night.

## 2020-11-14 NOTE — Telephone Encounter (Signed)
Spoke with pt and is doing better with not urinating quite so much today .Per pt will continue taking Furosemide twice daily as pt does not see much difference with breathing at this time and will call with update Will forward to Dr Ali Lowe for review .Adonis Housekeeper

## 2020-11-14 NOTE — Telephone Encounter (Signed)
Rachael [name] is returning Rachael [name]'s call.

## 2020-11-14 NOTE — Telephone Encounter (Signed)
Lm to call back ./cy 

## 2020-11-15 ENCOUNTER — Telehealth: Payer: Self-pay | Admitting: Student

## 2020-11-15 NOTE — Telephone Encounter (Addendum)
   Patient called Answering Service with concerns about shortness of breath. Called and spoke with patient. She was recently seen by Dr. Ali Lowe for shortness of breath. Echo was ordered and she was prescribed Lasix. She states this morning she developed sudden onset of more significant shortness of breath. She does sound short of breath on the phone and the longer we were on the phone the more significant this got. She denies any chest pain or palpitations. Given how short of breath she sounded at the end of the call, I advised she hang up and call 911. She voiced understanding and agreed.  Darreld Mclean, PA-C 11/15/2020 9:28 AM  ADDENDUM 11/15/2020 at 11:23AM: Patient recalled Answering Service.  She states called EMS as instructed.  Her vitals were normal when they arrived.  BP was 113/63.  Heart rate was in the 50s which is her baseline.  Blood glucose was 114.  O2 sat was 99%.  She states EMS told her that she was "borderline" on whether or not to take her to to the hospital.  However, her shortness of breath resolved so she decided not to go.  She does not sound short of breath at all on the phone now.  Discussed this morning's events in more detail.  She states she got up and went to the bathroom this morning.  She had a bowel movement and then was walking back to her room when she got acutely short of breath.  She states this is the second time this is happened.  She denies any chest pain, lightheadedness, dizziness, syncope.  Her breathing is now back to baseline.  Not exactly sure what caused her acute shortness of breath.  Wonder if she could of had a vagal episode after having a bowel movement but she did not have typical lightheadedness, dizziness, near syncope/syncope.  Given her shortness of breath has resolved and vitals were stable by EMS, I think it is okay to continue to monitor her for now.  Continue home Lasix.  She is currently wearing a 14-day ZIO monitor so we will wait to see what  that shows (she is not wearing a live monitor).  Also has Echo ordered. I emphasized going to the ED if she has any recurrent episodes today. She voiced understanding and agreed.  Darreld Mclean, PA-C 11/15/2020 11:29 AM

## 2020-11-16 ENCOUNTER — Telehealth: Payer: Self-pay | Admitting: Student

## 2020-11-16 NOTE — Telephone Encounter (Signed)
   Patient called Answering Service with concerns about Zio Monitor. Called and spoke with patient. She states she woke up this morning and her Zio monitor was flashing a red light on the side and the tap on both sides were coming up. I am unsure what the red light means but advised patient to call the 1-800 number on the box for assistance. I will also route note to Markus Daft to see if patient can contact patient on Monday to make sure she got everything worked out to see if patient needs any additional adhesive since tape is starting to come off. Patient voiced understanding and thanked me for calling.  Darreld Mclean, PA-C 11/16/2020 9:11 AM

## 2020-11-17 ENCOUNTER — Other Ambulatory Visit: Payer: Self-pay | Admitting: *Deleted

## 2020-11-17 ENCOUNTER — Other Ambulatory Visit (INDEPENDENT_AMBULATORY_CARE_PROVIDER_SITE_OTHER): Payer: PPO

## 2020-11-17 ENCOUNTER — Other Ambulatory Visit: Payer: Self-pay

## 2020-11-17 DIAGNOSIS — R001 Bradycardia, unspecified: Secondary | ICD-10-CM

## 2020-11-17 DIAGNOSIS — R0609 Other forms of dyspnea: Secondary | ICD-10-CM

## 2020-11-17 NOTE — Progress Notes (Unsigned)
Redo monitor from 11/12/20.  New serial # [id number].

## 2020-11-17 NOTE — Telephone Encounter (Signed)
Patient came into [facility] office to have a replacement 7 day ZIO XT monitor applied.  First monitor mailed back for processing. Patient stated may have 3 days of data on it before it started malfunctioning.

## 2020-11-21 NOTE — Telephone Encounter (Signed)
Per pt is breathing better Does have to stop drinking around 6 in the evening other wise would be up 3-4 times during the night Will forward to Dr Ali Lowe fro review .Adonis Housekeeper

## 2020-11-24 ENCOUNTER — Other Ambulatory Visit: Payer: Self-pay

## 2020-11-24 ENCOUNTER — Other Ambulatory Visit: Payer: PPO

## 2020-11-24 ENCOUNTER — Ambulatory Visit (HOSPITAL_COMMUNITY): Payer: PPO | Attending: Internal Medicine

## 2020-11-24 DIAGNOSIS — E785 Hyperlipidemia, unspecified: Secondary | ICD-10-CM | POA: Diagnosis not present

## 2020-11-24 DIAGNOSIS — I6523 Occlusion and stenosis of bilateral carotid arteries: Secondary | ICD-10-CM

## 2020-11-24 DIAGNOSIS — R0600 Dyspnea, unspecified: Secondary | ICD-10-CM

## 2020-11-24 DIAGNOSIS — I5030 Unspecified diastolic (congestive) heart failure: Secondary | ICD-10-CM

## 2020-11-24 DIAGNOSIS — R001 Bradycardia, unspecified: Secondary | ICD-10-CM | POA: Insufficient documentation

## 2020-11-24 LAB — ECHOCARDIOGRAM COMPLETE
Area-P 1/2: 1.93 cm2
P 1/2 time: 806 msec
S' Lateral: 3.3 cm

## 2020-11-26 ENCOUNTER — Telehealth: Payer: Self-pay | Admitting: *Deleted

## 2020-11-26 LAB — BASIC METABOLIC PANEL
BUN/Creatinine Ratio: 20 (ref 12–28)
BUN: 25 mg/dL (ref 8–27)
CO2: 25 mmol/L (ref 20–29)
Calcium: 9.8 mg/dL (ref 8.7–10.3)
Chloride: 104 mmol/L (ref 96–106)
Creatinine, Ser: 1.23 mg/dL — ABNORMAL HIGH (ref 0.57–1.00)
Glucose: 126 mg/dL — ABNORMAL HIGH (ref 70–99)
Potassium: 4.3 mmol/L (ref 3.5–5.2)
Sodium: 148 mmol/L — ABNORMAL HIGH (ref 134–144)
eGFR: 43 mL/min/{1.73_m2} — ABNORMAL LOW (ref >59)

## 2020-11-26 MED ORDER — FUROSEMIDE 20 MG PO TABS: ORAL_TABLET | ORAL | 3 refills | Status: DC

## 2020-11-26 NOTE — Telephone Encounter (Signed)
-----   Message from Early Osmond, MD sent at 11/25/2020  8:38 AM EST ----- Let her know echo shows good heart function and the relaxing function is a little weak as I suspected so the lasix would help. It looks like a lot of fluid has come off so she would go to every other day lasix if she feels like that is ok with her breathing.

## 2020-11-26 NOTE — Telephone Encounter (Signed)
Patient informed of lab results and recommendation.  Her breathing has improved so much.  Today was her best day in 2 years.  The swelling has improved.  She is going to reduce lasix to twice a day EVERY OTHER DAY.  She will call if any questions or concerns before her next visit.

## 2020-11-27 ENCOUNTER — Encounter: Payer: Self-pay | Admitting: Gastroenterology

## 2020-11-27 ENCOUNTER — Ambulatory Visit (INDEPENDENT_AMBULATORY_CARE_PROVIDER_SITE_OTHER): Payer: PPO | Admitting: Gastroenterology

## 2020-11-27 VITALS — BP 126/58 | HR 50 | Ht 66.0 in | Wt 227.0 lb

## 2020-11-27 DIAGNOSIS — R159 Full incontinence of feces: Secondary | ICD-10-CM | POA: Diagnosis not present

## 2020-11-27 DIAGNOSIS — K5900 Constipation, unspecified: Secondary | ICD-10-CM

## 2020-11-27 NOTE — Progress Notes (Signed)
History of Present Illness: This is an 87 year old female referred by [name], MD for the evaluation of constipation. Patient reports a weight gain from 184 to 222 and constipation since August. Weight today is 227 and review of Epic shows her weight is stable for the past 2 years in the 220-224 range. There is one weight of 184 which appears to be an error. She was evaluated in [facility] on 10/31 for SOB without a clear cause found. She relates constipation with pasty small stools and frequent fecal incontinence.  She notes frequent abdominal bloating.  No medication or diet changes. Denies weight loss, abdominal pain, diarrhea, change in stool caliber, melena, hematochezia, nausea, vomiting, dysphagia, reflux symptoms, chest pain.   Colonoscopy 2009 showed an IC valve ulcer    Allergies  Allergen Reactions   Prednisone Other (See Comments)    Muscle weakness  Caused severe numbness and weakness of  extremity   Shellfish Allergy Anaphylaxis and Hives    Hives all over the body. Hives all over the body   Percocet [Oxycodone-Acetaminophen] Hives   Iodinated Diagnostic Agents Other (See Comments)    PT IS NOT AWARE OF IODINE ALLERGY, PREMEDICATED FOR PRIOR PREMEDS ONLY//A.C. - unknown reaction   Iodine Hives   Outpatient Medications Prior to Visit  Medication Sig Dispense Refill   aspirin EC 81 MG EC tablet Take 1 tablet (81 mg total) by mouth daily.     atorvastatin (LIPITOR) 40 MG tablet Take 1 tablet (40 mg total) by mouth daily. 90 tablet 3   Cholecalciferol (VITAMIN D3 PO) Take 250 mg by mouth 4 (four) times daily.     Cyanocobalamin (B-12 PO) Take 1 tablet by mouth daily.      furosemide (LASIX) 20 MG tablet Take one tablet twice a day EVERY OTHER DAY 180 tablet 3   levothyroxine (SYNTHROID) 112 MCG tablet Take 1 tablet (112 mcg total) by mouth daily before breakfast. 90 tablet 3   Nutritional Supplements (JUICE PLUS FIBRE PO) Juice Plus     predniSONE (DELTASONE) 5 MG tablet  Take 1 tablet (5 mg total) by mouth daily with breakfast. 90 tablet 3   No facility-administered medications prior to visit.   Past Medical History:  Diagnosis Date   Breast cancer ([place])    R   CAROTID STENOSIS    Chronic venous insufficiency    CONTACT DERMATITIS    DUPUYTREN'S CONTRACTURE    Edema    pt  states had edema of feet and legs has been on lasix per dr. to help   GLUCOSE INTOLERANCE    HLD (hyperlipidemia)    Hypothyroidism    Dr. Ala Dach in [place]    Obesity    Osteoarthritis    bilat hips, severe   PVD (peripheral vascular disease) ([place])    mild carotid 11/05   VITAMIN D DEFICIENCY    Past Surgical History:  Procedure Laterality Date   2D echo  11/29/2003   normal LV size. normal EF    BACK SURGERY  12/06/2016   L3-L5 laminectomies per Dr. Maryellen Pile at [facility]  05/11/2011   right breast   BREAST LUMPECTOMY     right breast   [place]     incisional hernia   INGUINAL HERNIA REPAIR     JOINT REPLACEMENT Bilateral    Dr. Maureen Ralphs  normal caronary arteries     per pt. 2003   right rotator cuff surgery     SPINE SURGERY     TONSILLECTOMY     TOTAL HIP ARTHROPLASTY  05/2008 and 10/2008   B hip - alusio   Social History   Socioeconomic History   Marital status: Divorced    Spouse name: Not on file   Number of children: 5   Years of education: 1 year college   Highest education level: High school graduate  Occupational History   Not on file  Tobacco Use   Smoking status: Never   Smokeless tobacco: Never  Vaping Use   Vaping Use: Never used  Substance and Sexual Activity   Alcohol use: No    Alcohol/week: 0.0 standard drinks   Drug use: No   Sexual activity: Not Currently  Other Topics Concern   Not on file  Social History Narrative   Divorced; works part time   5 children   Social Determinants of Radio broadcast assistant  Strain: Low Risk    Difficulty of Paying Living Expenses: Not hard at all  Food Insecurity: No Food Insecurity   Worried About Charity fundraiser in the Last Year: Never true   Arboriculturist in the Last Year: Never true  Transportation Needs: No Transportation Needs   Lack of Transportation (Medical): No   Lack of Transportation (Non-Medical): No  Physical Activity: Sufficiently Active   Days of Exercise per Week: 3 days   Minutes of Exercise per Session: 60 min  Stress: No Stress Concern Present   Feeling of Stress : Not at all  Social Connections: Moderately Isolated   Frequency of Communication with Friends and Family: More than three times a week   Frequency of Social Gatherings with Friends and Family: More than three times a week   Attends Religious Services: More than 4 times per year   Active Member of [employer] or Organizations: No   Attends Music therapist: Never   Marital Status: Divorced   Family History  Problem Relation Age of Onset   Uterine cancer Mother 38   Cancer Mother    Ovarian cancer Mother    Heart disease Father        before age 82   Varicose Veins Father    Cancer Sister    Heart disease Brother        before age 43   Prostate cancer Maternal Uncle        diagnosed in his 58s   Prostate cancer Paternal Uncle        diagnosed in his 5s   Clotting disorder Maternal Grandmother    Pancreatic cancer Maternal Grandfather        diagnosed in his 66s   Breast cancer Daughter 92       BRCA negative (no BART)   Cancer Daughter    Colon cancer Cousin    Leukemia Cousin    Uterine cancer Cousin    Prostate cancer Cousin    Coronary artery disease Other        family hx - female 1st degree relative <60   Hypothyroidism Other        aunt    Diabetes Son    Anesthesia problems Neg Hx    Adrenal disorder Neg Hx    Esophageal cancer Neg Hx    Stomach cancer Neg Hx        Review of Systems: Pertinent  positive and negative review of  systems were noted in the above HPI section. All other review of systems were otherwise negative.   Physical Exam: General: Well developed, well nourished, no acute distress, ambulates with a walker, required assistant to position on the exam table Head: Normocephalic and atraumatic Eyes: Sclerae anicteric, EOMI Ears: Normal auditory acuity Mouth: Not examined, mask on during Covid-19 pandemic Neck: Supple, no masses or thyromegaly Lungs: Clear throughout to auscultation Heart: Regular rate and rhythm; no murmurs, rubs or bruits Abdomen: Soft, non tender and non distended. No masses, hepatosplenomegaly or hernias noted. Normal Bowel sounds Rectal: Decreased sphincter tone and decreased anal squeeze, no lesions, soft brown heme neg stool Musculoskeletal: Symmetrical with no gross deformities  Skin: No lesions on visible extremities Pulses:  Normal pulses noted Extremities: No clubbing, cyanosis, edema or deformities noted Neurological: Alert oriented x 4, grossly nonfocal Cervical Nodes:  No significant cervical adenopathy Inguinal Nodes: No significant inguinal adenopathy Psychological:  Alert and cooperative. Normal mood and affect   Assessment and Recommendations:  Constipation.  Abdominal bloating.  Frequent fecal incontinence. Miralax once or twice daily titrated for a complete BM daily.  At least 3 scheduled times on the commode each day prior to the urge for bowel movement.  If abdominal bloating is not improved begin Gas-X or similar product 3 times daily.  Kegel exercises 5 times a day long term. She is not good candidate for colonoscopy.  She is advised to keep a food and beverage diary to help her avoid foods and beverages that may lead to softer or looser stools.  Call if symptoms not controlled with above measures.  GI follow up as needed.  Follow up with PCP.  Her weight appears to be stable in the 220-225 range for the past 2 years.   cc: [name], MD [address] [place],  [place] [address]

## 2020-11-27 NOTE — Patient Instructions (Signed)
Take over the counter Miralax mixing 17 grams in 8 oz of water/juice 1-2 x daily. Titrate the Miralax depending on your bowe movements.   Fuller Plan Kegel exercises five times a day.  Call our office if your symptoms are not better.   Thank you for choosing me and [facility].  Pricilla Riffle. Dagoberto Ligas., MD., [facility]   Kegel Exercises Kegel exercises can help strengthen your pelvic floor muscles. The pelvic floor is a group of muscles that support your rectum, small intestine, and bladder. In females, pelvic floor muscles also help support the uterus. These muscles help you control the flow of urine and stool (feces). Kegel exercises are painless and simple. They do not require any equipment. Your provider may suggest Kegel exercises to: Improve bladder and bowel control. Improve sexual response. Improve weak pelvic floor muscles after surgery to remove the uterus (hysterectomy) or after pregnancy, in females. Improve weak pelvic floor muscles after prostate gland removal or surgery, in males. Kegel exercises involve squeezing your pelvic floor muscles. These are the same muscles you squeeze when you try to stop the flow of urine or keep from passing gas. The exercises can be done while sitting, standing, or lying down, but it is best to vary your position. Ask your health care provider which exercises are safe for you. Do exercises exactly as told by your health care provider and adjust them as directed. Do not begin these exercises until told by your health care provider. Exercises How to do Kegel exercises: Squeeze your pelvic floor muscles tight. You should feel a tight lift in your rectal area. If you are a female, you should also feel a tightness in your vaginal area. Keep your stomach, buttocks, and legs relaxed. Hold the muscles tight for up to 10 seconds. Breathe normally. Relax your muscles for up to 10 seconds. Repeat as told by your health care provider. Repeat this exercise  daily as told by your health care provider. Continue to do this exercise for at least 4-6 weeks, or for as long as told by your health care provider. You may be referred to a physical therapist who can help you learn more about how to do Kegel exercises. Depending on your condition, your health care provider may recommend: Varying how long you squeeze your muscles. Doing several sets of exercises every day. Doing exercises for several weeks. Making Kegel exercises a part of your regular exercise routine. This information is not intended to replace advice given to you by your health care provider. Make sure you discuss any questions you have with your health care provider. Document Revised: 05/08/2020 Document Reviewed: 05/08/2020 Elsevier Patient Education  2022 [employer].

## 2020-11-28 ENCOUNTER — Telehealth: Payer: Self-pay | Admitting: Gastroenterology

## 2020-11-28 DIAGNOSIS — R0609 Other forms of dyspnea: Secondary | ICD-10-CM | POA: Diagnosis not present

## 2020-11-28 DIAGNOSIS — R001 Bradycardia, unspecified: Secondary | ICD-10-CM | POA: Diagnosis not present

## 2020-11-28 NOTE — Telephone Encounter (Signed)
Received call from patient who saw Dr. Fuller Plan yesterday.  She said since them she's taken Metamucil 4 times with no results and wants something stronger before the weekend.  Please call.,

## 2020-12-02 ENCOUNTER — Telehealth: Payer: Self-pay | Admitting: *Deleted

## 2020-12-02 DIAGNOSIS — R001 Bradycardia, unspecified: Secondary | ICD-10-CM

## 2020-12-02 DIAGNOSIS — I471 Supraventricular tachycardia: Secondary | ICD-10-CM

## 2020-12-02 NOTE — Telephone Encounter (Signed)
Called patient and notified of results and recommendation to EP.  She said both of her brothers have slow rhythms and one has a pacemaker.  Will await call from EP scheduling.

## 2020-12-02 NOTE — Telephone Encounter (Signed)
-----   Message from Early Osmond, MD sent at 12/02/2020  7:33 AM EST ----- Given fast and slow episodes on monitor, lets have her see EP.  Thx.

## 2020-12-02 NOTE — Telephone Encounter (Signed)
I left a detailed message for the patient that Dr. Fuller Plan had recommended Miralax 1-2 times a day at the office visit on 11/17.  She is asked to call back if she has any additional questions or concerns.

## 2020-12-12 ENCOUNTER — Ambulatory Visit: Payer: PPO | Admitting: Cardiovascular Disease

## 2020-12-24 ENCOUNTER — Telehealth: Payer: Self-pay | Admitting: Family Medicine

## 2020-12-24 NOTE — Telephone Encounter (Signed)
Patient calling in with respiratory symptoms: Shortness of breath, chest pain, palpitations or other red words send to Triage  Does the patient have a fever over 100, cough, congestion, sore throat, runny nose, lost of taste/smell (please list symptoms that patient has)?cough x 5 day   What date did symptoms start?12-19-2020 (If over 5 days ago, pt may be scheduled for in person visit)  Have you tested for Covid in the last 5 days? No   If yes, was it positive []  OR negative [] ? If positive in the last 5 days, please schedule virtual visit now. If negative, schedule for an in person OV with the next available provider if PCP has no openings. Please also let patient know they will be tested again (follow the script below)  "you will have to arrive 27mins prior to your appt time to be Covid tested. Please park in back of office at the [facility] & call [phone] to let the staff know you have arrived. A staff member will meet you at your car to do a rapid covid test. Once the test has resulted you will be notified by phone of your results to determine if appt will remain an in person visit or be converted to a virtual/phone visit. If you arrive less than 16mins before your appt time, your visit will be automatically converted to virtual & any recommended testing will happen AFTER the visit." Pt has an appt with dr fry on 12-30-2020  THINGS TO REMEMBER  If no availability for virtual visit in office,  please schedule another [place] office  If no availability at another [place] office, please instruct patient that they can schedule an evisit or virtual visit through their mychart account. Visits up to 8pm  patients can be seen in office 5 days after positive COVID test

## 2020-12-25 ENCOUNTER — Telehealth: Payer: Self-pay | Admitting: Family Medicine

## 2020-12-25 NOTE — Telephone Encounter (Signed)
Patient calling in with respiratory symptoms: Shortness of breath, chest pain, palpitations or other red words send to Triage  Does the patient have a fever over 100, cough, congestion, sore throat, runny nose, lost of taste/smell (please list symptoms that patient has)? YES  What date did symptoms start? Over 5 days ago (If over 5 days ago, pt may be scheduled for in person visit)  Have you tested for Covid in the last 5 days? No   If yes, was it positive []  OR negative [] ? If positive in the last 5 days, please schedule virtual visit now. If negative, schedule for an in person OV with the next available provider if PCP has no openings. Please also let patient know they will be tested again (follow the script below)  "you will have to arrive 18mins prior to your appt time to be Covid tested. Please park in back of office at the [facility] & call [phone] to let the staff know you have arrived. A staff member will meet you at your car to do a rapid covid test. Once the test has resulted you will be notified by phone of your results to determine if appt will remain an in person visit or be converted to a virtual/phone visit. If you arrive less than 35mins before your appt time, your visit will be automatically converted to virtual & any recommended testing will happen AFTER the visit."   [place]  If no availability for virtual visit in office,  please schedule another [place] office  If no availability at another [place] office, please instruct patient that they can schedule an evisit or virtual visit through their mychart account. Visits up to 8pm  patients can be seen in office 5 days after positive COVID test

## 2020-12-26 ENCOUNTER — Other Ambulatory Visit: Payer: Self-pay

## 2020-12-26 ENCOUNTER — Encounter: Payer: Self-pay | Admitting: Family Medicine

## 2020-12-26 ENCOUNTER — Ambulatory Visit (INDEPENDENT_AMBULATORY_CARE_PROVIDER_SITE_OTHER): Payer: PPO

## 2020-12-26 ENCOUNTER — Ambulatory Visit (INDEPENDENT_AMBULATORY_CARE_PROVIDER_SITE_OTHER): Payer: PPO | Admitting: Family Medicine

## 2020-12-26 VITALS — BP 140/58 | HR 72 | Temp 98.5°F | Ht 66.0 in

## 2020-12-26 DIAGNOSIS — R0989 Other specified symptoms and signs involving the circulatory and respiratory systems: Secondary | ICD-10-CM | POA: Diagnosis not present

## 2020-12-26 DIAGNOSIS — R051 Acute cough: Secondary | ICD-10-CM | POA: Diagnosis not present

## 2020-12-26 DIAGNOSIS — R7989 Other specified abnormal findings of blood chemistry: Secondary | ICD-10-CM | POA: Diagnosis not present

## 2020-12-26 DIAGNOSIS — R0602 Shortness of breath: Secondary | ICD-10-CM

## 2020-12-26 DIAGNOSIS — I517 Cardiomegaly: Secondary | ICD-10-CM | POA: Diagnosis not present

## 2020-12-26 DIAGNOSIS — R059 Cough, unspecified: Secondary | ICD-10-CM | POA: Diagnosis not present

## 2020-12-26 NOTE — Patient Instructions (Addendum)
Start taking the lasix daily. You should note increased release of urine with this. If you do not feel that you have a significant amount of urination with the 20mg , then please increase to your 40mg .   I suspect with taking the lasix daily that it will help with coughing symptoms. Continue with compression stockings. Minimize salt in diet.   Keep follow up with cardiology. I will be in touch once I get bloodwork results and I will also forward those results to cardiology.

## 2020-12-26 NOTE — Progress Notes (Signed)
[name] DOB: [birth year] Encounter date: 12/26/2020  This is a 87 y.o. female who presents with Chief Complaint  Patient presents with   Cough    Non-productive x5 days, states cough occurs with breathing changes, she has undergone testing which reveals elevated and decreased heart rate, has an appointment with new cardiologist on Monday, denies recent Covid testing   Shortness of Breath    X1 month, patient states she is due to see a cardiologist on Monday   excessive sleepiness noted when she sits down    History of present illness:  Wore 7 day monitor. Notes that she starts coughing when she gets the changes in heart rate.   Ended up in ER because she was coughing, breath went away. She can feel it speed up. Sleeping more with this situation. Takes breath away with speeding up and then when it slows down she starts coughing.   Gets in coughing fits and then can't stop.    Sees EP on Monday. Taking fluid pill just once in awhile. Doesn't take if going out because leads to incontinence.   Bp: doesn't have cuff at home but states that usually around 118/68.   She does state that weight has gone up without her changing what she is eating. Protein shake for breakfast, salad for lunch and chicken/fish and salad for dinner.    Allergies  Allergen Reactions   Prednisone Other (See Comments)    Muscle weakness  Caused severe numbness and weakness of  extremity   Shellfish Allergy Anaphylaxis and Hives    Hives all over the body. Hives all over the body   Percocet [Oxycodone-Acetaminophen] Hives   Iodinated Diagnostic Agents Other (See Comments)    PT IS NOT AWARE OF IODINE ALLERGY, PREMEDICATED FOR PRIOR PREMEDS ONLY//A.C. - unknown reaction   Iodine Hives   Current Meds  Medication Sig   aspirin EC 81 MG EC tablet Take 1 tablet (81 mg total) by mouth daily.   atorvastatin (LIPITOR) 40 MG tablet Take 1 tablet (40 mg total) by mouth daily.   Cholecalciferol (VITAMIN D3  PO) Take 250 mg by mouth 4 (four) times daily.   Cyanocobalamin (B-12 PO) Take 1 tablet by mouth daily.    furosemide (LASIX) 20 MG tablet Take one tablet twice a day EVERY OTHER DAY   levothyroxine (SYNTHROID) 112 MCG tablet Take 1 tablet (112 mcg total) by mouth daily before breakfast.   Nutritional Supplements (JUICE PLUS FIBRE PO) Juice Plus   predniSONE (DELTASONE) 5 MG tablet Take 1 tablet (5 mg total) by mouth daily with breakfast.    Review of Systems  Constitutional:  Positive for unexpected weight change (when asked; states she has noted gain in weight). Negative for chills, fatigue and fever.  Respiratory:  Positive for cough and shortness of breath (with activities; has noted since having issues with heart rhythm.). Negative for chest tightness and wheezing.   Cardiovascular:  Positive for leg swelling (chronic; not really worse than baseline. wears compression). Negative for chest pain and palpitations.   Objective:  BP (!) 140/58 (BP Location: Left Arm, Patient Position: Sitting, Cuff Size: Large)    Pulse 72    Temp 98.5 F (36.9 C) (Oral)    Ht 5\' 6"  (1.676 m)    SpO2 95%    BMI 36.64 kg/m       BP Readings from Last 3 Encounters:  12/26/20 (!) 140/58  11/27/20 (!) 126/58  11/12/20 130/68   Wt Readings from  Last 3 Encounters:  11/27/20 227 lb (103 kg)  11/12/20 184 lb (83.5 kg)  05/13/20 220 lb 0.3 oz (99.8 kg)    Physical Exam Constitutional:      General: She is not in acute distress.    Appearance: She is well-developed. She is not ill-appearing, toxic-appearing or diaphoretic.  Cardiovascular:     Rate and Rhythm: Normal rate and regular rhythm.     Heart sounds: Normal heart sounds. No murmur heard.   No friction rub.  Pulmonary:     Effort: Pulmonary effort is normal. No respiratory distress.     Breath sounds: Examination of the right-lower field reveals rales. Examination of the left-lower field reveals rales. Rales present. No decreased breath sounds  or wheezing.  Musculoskeletal:     Right lower leg: 2+ Edema present.     Left lower leg: 2+ Edema present.  Neurological:     Mental Status: She is alert and oriented to person, place, and time.  Psychiatric:        Behavior: Behavior normal.    Assessment/Plan  1. Acute cough Suspect related to fluid overloud.  Have asked her to take diuretic on a daily basis.  Continue to monitor symptoms.  Needs to reach out if any worsening of symptoms.  2. Shortness of breath EKG stable, no acute changes, bradycardia. - DG Chest 2 View; Future - EKG 12-Lead - Brain natriuretic peptide; Future - CBC with Differential/Platelet; Future - CBC with Differential/Platelet - Brain natriuretic peptide  3. Elevated serum creatinine We will recheck today.  Last creatinine was slightly elevated and want to make sure this is stable with suggestion of increasing her Lasix frequency.  Instructed to take 40 mg of Lasix if she is not noticing increased urination with 20 mg.  Suspect that this will also help with her cough.  Keep follow-up visit with cardiology on Monday. - Comprehensive metabolic panel; Future - Comprehensive metabolic panel   Return if symptoms worsen or fail to improve.     Micheline Rough, MD

## 2020-12-27 LAB — COMPREHENSIVE METABOLIC PANEL
AG Ratio: 1.6 (calc) (ref 1.0–2.5)
ALT: 14 U/L (ref 6–29)
AST: 19 U/L (ref 10–35)
Albumin: 4.2 g/dL (ref 3.6–5.1)
Alkaline phosphatase (APISO): 68 U/L (ref 37–153)
BUN/Creatinine Ratio: 23 (calc) — ABNORMAL HIGH (ref 6–22)
BUN: 28 mg/dL — ABNORMAL HIGH (ref 7–25)
CO2: 24 mmol/L (ref 20–32)
Calcium: 9.8 mg/dL (ref 8.6–10.4)
Chloride: 105 mmol/L (ref 98–110)
Creat: 1.21 mg/dL — ABNORMAL HIGH (ref 0.60–0.95)
Globulin: 2.7 g/dL (calc) (ref 1.9–3.7)
Glucose, Bld: 99 mg/dL (ref 65–99)
Potassium: 4.6 mmol/L (ref 3.5–5.3)
Sodium: 142 mmol/L (ref 135–146)
Total Bilirubin: 1 mg/dL (ref 0.2–1.2)
Total Protein: 6.9 g/dL (ref 6.1–8.1)

## 2020-12-27 LAB — BRAIN NATRIURETIC PEPTIDE: Brain Natriuretic Peptide: 370 pg/mL — ABNORMAL HIGH (ref <100)

## 2020-12-27 LAB — CBC WITH DIFFERENTIAL/PLATELET
Absolute Monocytes: 928 cells/uL (ref 200–950)
Basophils Absolute: 44 cells/uL (ref 0–200)
Basophils Relative: 0.3 %
Eosinophils Absolute: 131 cells/uL (ref 15–500)
Eosinophils Relative: 0.9 %
HCT: 44.6 % (ref 35.0–45.0)
Hemoglobin: 15 g/dL (ref 11.7–15.5)
Lymphs Abs: 2306 cells/uL (ref 850–3900)
MCH: 29.9 pg (ref 27.0–33.0)
MCHC: 33.6 g/dL (ref 32.0–36.0)
MCV: 88.8 fL (ref 80.0–100.0)
MPV: 8.5 fL (ref 7.5–12.5)
Monocytes Relative: 6.4 %
Neutro Abs: 11093 cells/uL — ABNORMAL HIGH (ref 1500–7800)
Neutrophils Relative %: 76.5 %
Platelets: 342 10*3/uL (ref 140–400)
RBC: 5.02 10*6/uL (ref 3.80–5.10)
RDW: 13.3 % (ref 11.0–15.0)
Total Lymphocyte: 15.9 %
WBC: 14.5 10*3/uL — ABNORMAL HIGH (ref 3.8–10.8)

## 2020-12-28 ENCOUNTER — Encounter: Payer: Self-pay | Admitting: *Deleted

## 2020-12-28 ENCOUNTER — Other Ambulatory Visit: Payer: Self-pay

## 2020-12-28 ENCOUNTER — Ambulatory Visit
Admission: EM | Admit: 2020-12-28 | Discharge: 2020-12-28 | Disposition: A | Discharge status: Home / Self Care | Payer: PPO

## 2020-12-28 DIAGNOSIS — R059 Cough, unspecified: Secondary | ICD-10-CM

## 2020-12-28 DIAGNOSIS — R0602 Shortness of breath: Secondary | ICD-10-CM

## 2020-12-28 NOTE — ED Provider Notes (Addendum)
[facility]    CSN: [id number] Arrival date & time: 12/28/20  1319      History   Chief Complaint Chief Complaint  Patient presents with   Shortness of Breath    HPI Rachael [name] is a 87 y.o. female.   Patient presents with shortness of breath and cough that has been present since September.  Patient reports that she came to the urgent care today due to her cough becoming productive with green mucus over the past few days.  Denies any upper respiratory symptoms or fever.  Shortness of breath has been consistent and has not worsened per patient.  She reports that shortness of breath occurs mainly with exertion.  Patient was seen by PCP approximately 3 days ago and a chest x-ray was completed.  She reports that the PCP told her that there was "fluid on her lungs". She does take Lasix for this.  She has an appointment with cardiologist tomorrow for further evaluation.   Shortness of Breath  Past Medical History:  Diagnosis Date   Breast cancer ([place])    R   CAROTID STENOSIS    Chronic venous insufficiency    CONTACT DERMATITIS    DUPUYTREN'S CONTRACTURE    Edema    pt  states had edema of feet and legs has been on lasix per dr. to help   GLUCOSE INTOLERANCE    HLD (hyperlipidemia)    Hypothyroidism    Dr. Ala Dach in [place]    Obesity    Osteoarthritis    bilat hips, severe   PVD (peripheral vascular disease) ([place])    mild carotid 11/05   VITAMIN D DEFICIENCY     Patient Active Problem List   Diagnosis Date Noted   Dyspnea 11/12/2020   Cellulitis of right leg 11/14/2019   Acquired hypothyroidism 07/18/2019   Weight gain 07/18/2019   OAB (overactive bladder) 04/23/2019   Acute encephalopathy 02/25/2018   Bradycardia 02/25/2018   TIA (transient ischemic attack) 02/24/2018   Carotid artery stenosis 02/24/2018   Malignant neoplasm of lower-inner quadrant of right breast of female, estrogen receptor positive ([place])  03/28/2017   PMR (polymyalgia rheumatica) ([place]) 11/11/2016   Spinal stenosis, lumbar 08/26/2016   SIRS (systemic inflammatory response syndrome) ([facility])    Weakness of both lower extremities    Arthritis    Gout attack 10/23/2014   Anemia in neoplastic disease 04/02/2014   CKD (chronic kidney disease), stage III ([place]) 04/02/2014   Acute renal failure ([place]) 03/18/2014   Leg edema, right    Breast cancer, right breast ([place]) 05/13/2011   Incisional hernia, incarcerated, right upper quadrant 07/13/2010   HEADACHE 11/06/2009   FATIGUE 06/13/2009   EDEMA 05/01/2008   Backache 03/29/2008   CAROTID STENOSIS 03/16/2008   Anxiety state 11/14/2007   MENOPAUSAL SYNDROME 09/27/2007   GLUCOSE INTOLERANCE 08/10/2007   Dyslipidemia 08/10/2007   PERIPHERAL VASCULAR DISEASE 08/10/2007   Venous (peripheral) insufficiency 08/10/2007   Vitamin D deficiency 12/26/2006   OBESITY 12/26/2006   Hypothyroidism 11/05/2006   Osteoarthritis 11/05/2006    Past Surgical History:  Procedure Laterality Date   2D echo  11/29/2003   normal LV size. normal EF    BACK SURGERY  12/06/2016   L3-L5 laminectomies per Dr. Maryellen Pile at [place]  05/11/2011   right breast   BREAST LUMPECTOMY     right breast   CHOLECYSTECTOMY     FINGER SURGERY  HERNIA REPAIR     incisional hernia   INGUINAL HERNIA REPAIR     JOINT REPLACEMENT Bilateral    Dr. Maureen Ralphs   normal caronary arteries     per pt. 2003   right rotator cuff surgery     [place]  05/2008 and 10/2008   B hip - alusio    OB History   No obstetric history on file.      Home Medications    Prior to Admission medications   Medication Sig Start Date End Date Taking? Authorizing Provider  aspirin EC 81 MG EC tablet Take 1 tablet (81 mg total) by mouth daily. 02/27/18   Black, Lezlie Octave, NP  atorvastatin (LIPITOR) 40 MG tablet Take 1 tablet (40 mg total) by mouth daily.  11/12/20   Early Osmond, MD  Cholecalciferol (VITAMIN D3 PO) Take 250 mg by mouth 4 (four) times daily.    [provider]  Cyanocobalamin (B-12 PO) Take 1 tablet by mouth daily.     [provider]  furosemide (LASIX) 20 MG tablet Take one tablet twice a day EVERY OTHER DAY 11/26/20   Early Osmond, MD  levothyroxine (SYNTHROID) 112 MCG tablet Take 1 tablet (112 mcg total) by mouth daily before breakfast. 10/27/20   [name], MD  Nutritional Supplements (JUICE PLUS FIBRE PO) Juice Plus    [provider]  predniSONE (DELTASONE) 5 MG tablet Take 1 tablet (5 mg total) by mouth daily with breakfast. 04/29/20   [name], MD    Family History Family History  Problem Relation Age of Onset   Uterine cancer Mother 38   Cancer Mother    Ovarian cancer Mother    Heart disease Father        before age 74   Varicose Veins Father    Cancer Sister    Heart disease Brother        before age 84   Prostate cancer Maternal Uncle        diagnosed in his 85s   Prostate cancer Paternal Uncle        diagnosed in his 85s   Clotting disorder Maternal Grandmother    Pancreatic cancer Maternal Grandfather        diagnosed in his 40s   Breast cancer Daughter 105       BRCA negative (no BART)   Cancer Daughter    Colon cancer Cousin    Leukemia Cousin    Uterine cancer Cousin    Prostate cancer Cousin    Coronary artery disease Other        family hx - female 1st degree relative <60   Hypothyroidism Other        aunt    Diabetes Son    Anesthesia problems Neg Hx    Adrenal disorder Neg Hx    Esophageal cancer Neg Hx    Stomach cancer Neg Hx     Social History Social History   Tobacco Use   Smoking status: Never   Smokeless tobacco: Never  Vaping Use   Vaping Use: Never used  Substance Use Topics   Alcohol use: No    Alcohol/week: 0.0 standard drinks   Drug use: No     Allergies   Prednisone, Shellfish allergy, Percocet  [oxycodone-acetaminophen], Iodinated diagnostic agents, and Iodine   Review of Systems Review of Systems Per HPI  Physical Exam Triage Vital  Signs ED Triage Vitals  Enc Vitals Group     BP 12/28/20 1351 130/70     Pulse Rate 12/28/20 1351 (!) 57     Resp 12/28/20 1351 (!) 22     Temp 12/28/20 1351 98.1 F (36.7 C)     Temp src --      SpO2 12/28/20 1351 96 %     Weight --      Height --      Head Circumference --      Peak Flow --      Pain Score 12/28/20 1353 0     Pain Loc --      Pain Edu? --      Excl. in [place]? --    No data found.  Updated Vital Signs BP 130/70    Pulse (!) 57    Temp 98.1 F (36.7 C)    Resp (!) 22    SpO2 96%   Visual Acuity Right Eye Distance:   Left Eye Distance:   Bilateral Distance:    Right Eye Near:   Left Eye Near:    Bilateral Near:     Physical Exam Constitutional:      General: She is not in acute distress.    Appearance: Normal appearance. She is not toxic-appearing or diaphoretic.  HENT:     Head: Normocephalic and atraumatic.  Eyes:     Extraocular Movements: Extraocular movements intact.     Conjunctiva/sclera: Conjunctivae normal.  Cardiovascular:     Rate and Rhythm: Normal rate and regular rhythm.     Pulses: Normal pulses.     Heart sounds: Normal heart sounds.  Pulmonary:     Effort: Pulmonary effort is normal.     Breath sounds: Normal breath sounds.     Comments: Mild tachypnea noted with exertion on physical exam. Neurological:     General: No focal deficit present.     Mental Status: She is alert and oriented to person, place, and time. Mental status is at baseline.  Psychiatric:        Mood and Affect: Mood normal.        Behavior: Behavior normal.        Thought Content: Thought content normal.        Judgment: Judgment normal.     UC Treatments / Results  Labs (all labs ordered are listed, but only abnormal results are displayed) Labs Reviewed - No data to display  EKG   Radiology DG Chest 2  View  Result Date: 12/28/2020 CLINICAL DATA:  Shortness of breath.  Cough.  Rales. EXAM: CHEST - 2 VIEW COMPARISON:  Radiograph 11/10/2020 FINDINGS: Chronic cardiomegaly which is unchanged from prior exam. Stable aortic tortuosity and atherosclerosis. Diffuse bronchial thickening. There is subpleural opacities in the periphery of the left lung base. No confluent consolidation. No pleural effusion or pneumothorax. Mild thoracic spondylosis. IMPRESSION: 1. Diffuse bronchial thickening can be seen in the setting of bronchitis. 2. Peripheral subpleural opacities at the left lung base. Nonspecific radiographic appearance, however may represent sequela of interstitial lung disease. Should symptoms persist, consider further assessment with high-resolution chest CT. Electronically Signed   By: Keith Rake M.D.   On: 12/28/2020 11:21    Procedures Procedures (including critical care time)  Medications Ordered in UC Medications - No data to display  Initial Impression / Assessment and Plan / UC Course  I have reviewed the triage vital signs and the nursing notes.  Pertinent labs & imaging results  that were available during my care of the patient were reviewed by me and considered in my medical decision making (see chart for details).     After further review of patient's PCP visit, radiology suggested further evaluation with CT scan of the chest if symptoms persist or worsen. Do not have x-ray tech in urgent care today and do not think that outpatient imaging with an x-ray would benefit patient.  It appears that patient may need CT scan of the chest.  Patient also has some tachypnea noted with exertion on physical exam.  Patient will need further evaluation and management at the hospital.  There are no benefits that I can provide here at the urgent care setting.  Advised patient to go to the hospital.  Patient voiced understanding and was agreeable with plan.  Vital signs stable at discharge.  Agree  with patient's family member transporting her to the hospital. Final Clinical Impressions(s) / UC Diagnoses   Final diagnoses:  Shortness of breath  Cough, unspecified type     Discharge Instructions      Please go to the emergency department as soon as you leave urgent care for further evaluation and management.    ED Prescriptions   None    PDMP not reviewed this encounter.   Teodora Medici, [place] 12/28/20 1415    Teodora Medici, [place] 12/28/20 1416

## 2020-12-28 NOTE — ED Triage Notes (Signed)
Pt reports she was seen at her PCP on Friday and was told she had fluid on her lungs. Pt reports SHOB is new and called her MD and was instructed to come to UC.

## 2020-12-28 NOTE — Discharge Instructions (Signed)
Please go to the emergency department as soon as you leave urgent care for further evaluation and management. ?

## 2020-12-29 ENCOUNTER — Encounter: Payer: Self-pay | Admitting: Internal Medicine

## 2020-12-29 ENCOUNTER — Ambulatory Visit: Payer: PPO | Admitting: Internal Medicine

## 2020-12-29 ENCOUNTER — Other Ambulatory Visit: Payer: Self-pay

## 2020-12-29 VITALS — BP 134/70 | HR 57 | Ht 66.0 in

## 2020-12-29 DIAGNOSIS — R001 Bradycardia, unspecified: Secondary | ICD-10-CM | POA: Diagnosis not present

## 2020-12-29 DIAGNOSIS — R0600 Dyspnea, unspecified: Secondary | ICD-10-CM | POA: Diagnosis not present

## 2020-12-29 NOTE — Patient Instructions (Addendum)
Medication Instructions:  Your physician recommends that you continue on your current medications as directed. Please refer to the Current Medication list given to you today.  Labwork: None ordered.  Testing/Procedures: Your physician has recommended that you have a pulmonary function test. Pulmonary Function Tests are a group of tests that measure how well air moves in and out of your lungs.   Please schedule for PFT's  Follow-Up: Your physician wants you to follow-up with [name], MD based on results of your breathing test.   Any Other Special Instructions Will Be Listed Below (If Applicable).  If you need a refill on your cardiac medications before your next appointment, please call your pharmacy.

## 2020-12-29 NOTE — Progress Notes (Signed)
HPI Rachael [name] is referred by Rachael [name] for evaluation of tachy-brady syndrome. She c/o sob and has had a cough. Her cough is intermittently productive. She has worn a cardiac monitor demonstrating NSR and sinus brady with over 3 second pauses as well as SVT, likely AT at rates of over 120/min. She has minimal palpitations and does not have syncope. She mainly c/o dyspnea with exertion. A 2D echo demonstrates normal LV function with normal RV pressures.  Allergies  Allergen Reactions   Prednisone Other (See Comments)    Muscle weakness  Caused severe numbness and weakness of  extremity   Shellfish Allergy Anaphylaxis and Hives    Hives all over the body. Hives all over the body   Percocet [Oxycodone-Acetaminophen] Hives   Iodinated Diagnostic Agents Other (See Comments)    PT IS NOT AWARE OF IODINE ALLERGY, PREMEDICATED FOR PRIOR PREMEDS ONLY//A.C. - unknown reaction   Iodine Hives     Current Outpatient Medications  Medication Sig Dispense Refill   aspirin EC 81 MG EC tablet Take 1 tablet (81 mg total) by mouth daily.     atorvastatin (LIPITOR) 40 MG tablet Take 1 tablet (40 mg total) by mouth daily. 90 tablet 3   Cholecalciferol (VITAMIN D3 PO) Take 250 mg by mouth 4 (four) times daily.     Cyanocobalamin (B-12 PO) Take 1 tablet by mouth daily.      furosemide (LASIX) 20 MG tablet Take one tablet twice a day EVERY OTHER DAY 180 tablet 3   levothyroxine (SYNTHROID) 112 MCG tablet Take 1 tablet (112 mcg total) by mouth daily before breakfast. 90 tablet 3   Nutritional Supplements (JUICE PLUS FIBRE PO) Juice Plus     predniSONE (DELTASONE) 5 MG tablet Take 1 tablet (5 mg total) by mouth daily with breakfast. 90 tablet 3   No current facility-administered medications for this visit.     Past Medical History:  Diagnosis Date   Breast cancer ([place])    R   CAROTID STENOSIS    Chronic venous insufficiency    CONTACT DERMATITIS    DUPUYTREN'S CONTRACTURE    Edema    pt   states had edema of feet and legs has been on lasix per dr. to help   GLUCOSE INTOLERANCE    HLD (hyperlipidemia)    Hypothyroidism    Rachael [name] in [place]    Obesity    Osteoarthritis    bilat hips, severe   PVD (peripheral vascular disease) ([place])    mild carotid 11/05   VITAMIN D DEFICIENCY     ROS:   All systems reviewed and negative except as noted in the HPI.   Past Surgical History:  Procedure Laterality Date   2D echo  11/29/2003   normal LV size. normal EF    BACK SURGERY  12/06/2016   L3-L5 laminectomies per Rachael [name] at [place]  05/11/2011   right breast   BREAST LUMPECTOMY     right breast   [place]     incisional hernia   INGUINAL HERNIA REPAIR     JOINT REPLACEMENT Bilateral    Rachael [name]   normal caronary arteries     per pt. 2003   right rotator cuff surgery     [place]  05/2008 and 10/2008   B hip - alusio     Family History  Problem Relation Age of Onset   Uterine cancer Mother 31   Cancer Mother    Ovarian cancer Mother    Heart disease Father        before age 32   Varicose Veins Father    Cancer Sister    Heart disease Brother        before age 21   Prostate cancer Maternal Uncle        diagnosed in his 17s   Prostate cancer Paternal Uncle        diagnosed in his 3s   Clotting disorder Maternal Grandmother    Pancreatic cancer Maternal Grandfather        diagnosed in his 19s   Breast cancer Daughter 76       BRCA negative (no BART)   Cancer Daughter    Colon cancer Cousin    Leukemia Cousin    Uterine cancer Cousin    Prostate cancer Cousin    Coronary artery disease Other        family hx - female 1st degree relative <60   Hypothyroidism Other        aunt    Diabetes Son    Anesthesia problems Neg Hx    Adrenal disorder Neg Hx    Esophageal cancer Neg Hx     Stomach cancer Neg Hx      Social History   Socioeconomic History   Marital status: Divorced    Spouse name: Not on file   Number of children: 5   Years of education: 1 year college   Highest education level: High school graduate  Occupational History   Not on file  Tobacco Use   Smoking status: Never   Smokeless tobacco: Never  Vaping Use   Vaping Use: Never used  Substance and Sexual Activity   Alcohol use: No    Alcohol/week: 0.0 standard drinks   Drug use: No   Sexual activity: Not Currently  Other Topics Concern   Not on file  Social History Narrative   Divorced; works part time   5 children   Social Determinants of Radio broadcast assistant Strain: Low Risk    Difficulty of Paying Living Expenses: Not hard at all  Food Insecurity: No Food Insecurity   Worried About Charity fundraiser in the Last Year: Never true   Arboriculturist in the Last Year: Never true  Transportation Needs: No Transportation Needs   Lack of Transportation (Medical): No   Lack of Transportation (Non-Medical): No  Physical Activity: Sufficiently Active   Days of Exercise per Week: 3 days   Minutes of Exercise per Session: 60 min  Stress: No Stress Concern Present   Feeling of Stress : Not at all  Social Connections: Moderately Isolated   Frequency of Communication with Friends and Family: More than three times a week   Frequency of Social Gatherings with Friends and Family: More than three times a week   Attends Religious Services: More than 4 times per year   Active Member of [employer] or Organizations: No   Attends Archivist Meetings: Never   Marital Status: Divorced  Human resources officer Violence: Not At Risk   Fear of Current or Ex-Partner: No   Emotionally Abused: No   Physically Abused: No   Sexually Abused: No     BP 134/70    Pulse Marland Kitchen)  57    Ht 5' 6"  (1.676 m)    SpO2 97%    BMI 36.64 kg/m   Physical Exam:  Well appearing NAD HEENT: Unremarkable Neck:  No  JVD, no thyromegally Lymphatics:  No adenopathy Back:  No CVA tenderness Lungs:  Clear with no wheezes HEART:  Regular rate rhythm, no murmurs, no rubs, no clicks Abd:  soft, positive bowel sounds, no organomegally, no rebound, no guarding Ext:  2 plus pulses, no edema, no cyanosis, no clubbing Skin:  No rashes no nodules Neuro:  CN II through XII intact, motor grossly intact  Cardiac monitor - reviewed  Assess/Plan:  Sinus node dysfunction - we discussed the findings and I suspect that she will require PPM insertion in the future.  SVT - she is minimally symptomatic. I suspect that she will continue to have more of this. Ideally we would add a beta blocker or flecainide. Dyspnea - this is her biggest problem and a CXR suggests bronchitis. I have asked her to undergo PFT's. She is pending a primary care visit tomorrow and I will defer workup of bronchitis to her primary MD.  Rachael [name]

## 2020-12-30 ENCOUNTER — Ambulatory Visit: Payer: PPO | Admitting: Family Medicine

## 2020-12-30 ENCOUNTER — Telehealth: Payer: Self-pay | Admitting: Family Medicine

## 2020-12-30 NOTE — Telephone Encounter (Signed)
Patient calling in with respiratory symptoms: Shortness of breath, chest pain, palpitations or other red words send to Triage  Does the patient have a fever over 100, cough, congestion, sore throat, runny nose, lost of taste/smell (please list symptoms that patient has)? Heart issues that she saw cardiologist that is causing tickle in her throat and cough  symptoms start? (If over 5 days ago, pt may be scheduled for in person visit)  Have you tested for Covid in the last 5 days? No   If yes, was it positive []  OR negative [] ? If positive in the last 5 days, please schedule virtual visit now. If negative, schedule for an in person OV with the next available provider if PCP has no openings. Please also let patient know they will be tested again (follow the script below)  "you will have to arrive 49mins prior to your appt time to be Covid tested. Please park in back of office at the cone & call [phone] to let the staff know you have arrived. A staff member will meet you at your car to do a rapid covid test. Once the test has resulted you will be notified by phone of your results to determine if appt will remain an in person visit or be converted to a virtual/phone visit. If you arrive less than 15mins before your appt time, your visit will be automatically converted to virtual & any recommended testing will happen AFTER the visit." Pt said her cardiologist told her she has bronchitis and to see her pcp. Pt has an appt on Friday 01-02-2021 at 86 am  [place]  If no availability for virtual visit in office,  please schedule another [place] office  If no availability at another [place] office, please instruct patient that they can schedule an evisit or virtual visit through their mychart account. Visits up to 8pm  patients can be seen in office 5 days after positive COVID test

## 2020-12-31 ENCOUNTER — Telehealth: Payer: Self-pay | Admitting: Internal Medicine

## 2020-12-31 NOTE — Telephone Encounter (Signed)
Returned call to Pt.  Advised she would need to have treatment per her PCP.  Pt indicates understanding

## 2020-12-31 NOTE — Telephone Encounter (Signed)
Patient states she was in to see Dr. Lovena Le on Monday, and he referred her back to her PCP.  They PCP doesn't doesn't have anything until Friday.  She is wondering if Dr. Lovena Le could prescribe her something for her bronchitis.

## 2021-01-01 ENCOUNTER — Ambulatory Visit (INDEPENDENT_AMBULATORY_CARE_PROVIDER_SITE_OTHER): Payer: PPO | Admitting: Internal Medicine

## 2021-01-01 ENCOUNTER — Other Ambulatory Visit: Payer: Self-pay

## 2021-01-01 DIAGNOSIS — R0600 Dyspnea, unspecified: Secondary | ICD-10-CM

## 2021-01-01 LAB — PULMONARY FUNCTION TEST
DL/VA % pred: 79 %
DL/VA: 3.13 ml/min/mmHg/L
DLCO cor % pred: 65 %
DLCO cor: 13.47 ml/min/mmHg
DLCO unc % pred: 68 %
DLCO unc: 14.09 ml/min/mmHg
FEF 25-75 Post: 2 L/sec
FEF 25-75 Pre: 1.46 L/sec
FEF2575-%Change-Post: 36 %
FEF2575-%Pred-Post: 151 %
FEF2575-%Pred-Pre: 110 %
FEV1-%Change-Post: 8 %
FEV1-%Pred-Post: 98 %
FEV1-%Pred-Pre: 90 %
FEV1-Post: 2.02 L
FEV1-Pre: 1.86 L
FEV1FVC-%Change-Post: 7 %
FEV1FVC-%Pred-Pre: 103 %
FEV6-%Change-Post: 0 %
FEV6-%Pred-Post: 95 %
FEV6-%Pred-Pre: 94 %
FEV6-Post: 2.5 L
FEV6-Pre: 2.47 L
FEV6FVC-%Change-Post: 0 %
FEV6FVC-%Pred-Post: 105 %
FEV6FVC-%Pred-Pre: 105 %
FVC-%Change-Post: 0 %
FVC-%Pred-Post: 90 %
FVC-%Pred-Pre: 89 %
FVC-Post: 2.5 L
FVC-Pre: 2.47 L
Post FEV1/FVC ratio: 81 %
Post FEV6/FVC ratio: 100 %
Pre FEV1/FVC ratio: 75 %
Pre FEV6/FVC Ratio: 100 %
RV % pred: 113 %
RV: 2.99 L
TLC % pred: 100 %
TLC: 5.54 L

## 2021-01-01 LAB — SARS CORONAVIRUS 2 (TAT 6-24 HRS): SARS Coronavirus 2: NEGATIVE

## 2021-01-01 NOTE — Progress Notes (Signed)
Full PFT completed today ? ?

## 2021-01-02 ENCOUNTER — Ambulatory Visit (INDEPENDENT_AMBULATORY_CARE_PROVIDER_SITE_OTHER): Payer: PPO | Admitting: Family Medicine

## 2021-01-02 ENCOUNTER — Encounter: Payer: Self-pay | Admitting: Family Medicine

## 2021-01-02 VITALS — BP 124/70 | HR 61 | Temp 98.6°F | Wt 227.0 lb

## 2021-01-02 DIAGNOSIS — J40 Bronchitis, not specified as acute or chronic: Secondary | ICD-10-CM | POA: Diagnosis not present

## 2021-01-02 MED ORDER — DOXYCYCLINE HYCLATE 100 MG PO CAPS: 100.0000 mg | ORAL_CAPSULE | Freq: Two times a day (BID) | ORAL | 0 refills | Status: AC

## 2021-01-02 NOTE — Progress Notes (Signed)
° °  Subjective:    Patient ID: Rachael [name], female    DOB: 10-04-1933, 87 y.o.   MRN: [id number]  HPI Here for several weeks of chest congestion and coughing up yellow sputum. No fever or chest pain or SOB. She went to urgent care on 12-28-20 and they felt shehad a viral infection. She then saw Dr. Lovena Le, her cardiologist, on 12-29-20 and he felt she may have bronchitis. He asks her to see [place]. The coughing continues today.    Review of Systems  Constitutional: Negative.   HENT: Negative.    Eyes: Negative.   Respiratory:  Positive for cough and chest tightness. Negative for shortness of breath and wheezing.   Cardiovascular: Negative.       Objective:   Physical Exam Constitutional:      Appearance: Normal appearance. She is not ill-appearing.  Cardiovascular:     Rate and Rhythm: Normal rate and regular rhythm.     Pulses: Normal pulses.     Heart sounds: Normal heart sounds.  Pulmonary:     Effort: Pulmonary effort is normal. No respiratory distress.     Breath sounds: Normal breath sounds. No stridor. No wheezing, rhonchi or rales.  Lymphadenopathy:     Cervical: No cervical adenopathy.  Neurological:     Mental Status: She is alert.          Assessment & Plan:  Bronchitis, treat with Doxycycline and Mucinex.  Alysia Penna, MD

## 2021-01-08 ENCOUNTER — Telehealth: Payer: Self-pay

## 2021-01-08 DIAGNOSIS — R0600 Dyspnea, unspecified: Secondary | ICD-10-CM

## 2021-01-08 NOTE — Telephone Encounter (Signed)
Reiterated earlier instruction.

## 2021-01-08 NOTE — Telephone Encounter (Signed)
Per previous office visit, had advised Pt that Dr. Lovena Le would discuss PFT results with Pt in person to determine further plans.  Results of PFT's was abnormal-Dr. Lovena Le requests referral to pulmonary for further evaluation.  Advised Pt first available to discuss with [facility] was 01/31/2020-or can place referral now to pulmonary.  Pt requests referral.  Will wait to schedule f/u with Dr. Lovena Le.  Pt in agreement with plan.

## 2021-01-08 NOTE — Telephone Encounter (Signed)
-----   Message from Evans Lance, MD sent at 01/03/2021 11:03 AM EST ----- Please refer her to pulmonary for eval of dyspnea and abnormal PFTs/DLCO.

## 2021-01-08 NOTE — Telephone Encounter (Signed)
Follow Up:     Patient said she talker to Sonia Baller earlier and needs to talk to her again please.

## 2021-01-09 ENCOUNTER — Ambulatory Visit (INDEPENDENT_AMBULATORY_CARE_PROVIDER_SITE_OTHER): Payer: PPO | Admitting: Internal Medicine

## 2021-01-09 ENCOUNTER — Other Ambulatory Visit: Payer: Self-pay

## 2021-01-09 ENCOUNTER — Encounter: Payer: Self-pay | Admitting: Internal Medicine

## 2021-01-09 VITALS — BP 122/62 | HR 59 | Temp 98.2°F | Ht 67.0 in | Wt 227.0 lb

## 2021-01-09 DIAGNOSIS — R0600 Dyspnea, unspecified: Secondary | ICD-10-CM

## 2021-01-09 DIAGNOSIS — D630 Anemia in neoplastic disease: Secondary | ICD-10-CM | POA: Diagnosis not present

## 2021-01-09 DIAGNOSIS — J849 Interstitial pulmonary disease, unspecified: Secondary | ICD-10-CM

## 2021-01-09 LAB — D-DIMER, QUANTITATIVE: D-Dimer, Quant: 1.17 mcg/mL FEU — ABNORMAL HIGH (ref <0.50)

## 2021-01-09 NOTE — Progress Notes (Signed)
01/09/21- 87 yoF never smoker for pulmonary evaluation courtesy of Greg Taylor, MD with concern of abnormal PFT. °Medical problem list includes  Chronic Venous Insufficiency, Peripheral Vascular Disease, TIA, Carotid Artery Stenosis, Hypothyroid, CKD3, Hyperlipidemia, Hx R Beast Cancer, Vit D Deficiency, Obesity, Lumbar Spine Stenosis, Covid infection Dec2021 °Recent bronchitis > doxcycline by Dr Fry. °Tachy-brady> saw cardiology 12/19. °Echo> nl LV, nl RV pressures.   BNP 12/16= 370 °WBC 12/16 was 14.5 K  Hgb 15. °Covid vax- no °Flu vax-no °-----Patient has been having shortness of breath with exertion since September. Had PFT done and is here to go over it. States that has been having issues with her heart and that is when she also has shortness of breath. No cough at this time. Had bronchitis about 2 weeks ago. °She clearly associates episodes of exertional dyspnea with episodes of palpitation. Only noted with exertion and only occasional. Denies wheeze or cough. Wears elastic hose for peripheral edema, but denies leg pain or hx VTE.  °Not anemic. No hx XRT for her breast cancer. No amiodarone. Had Covid infection Dec2021 with complete resolution, no lingering cough or dyspnea then. °PFT 01/01/21- Mild Diffusion Defect °CXR 12/28/20- °IMPRESSION: °1. Diffuse bronchial thickening can be seen in the setting of °bronchitis. °2. Peripheral subpleural opacities at the left lung base. °Nonspecific radiographic appearance, however may represent sequela °of interstitial lung disease. Should symptoms persist, consider °further assessment with high-resolution chest CT. ° °Prior to Admission medications   °Medication Sig Start Date End Date Taking? Authorizing Provider  °aspirin EC 81 MG EC tablet Take 1 tablet (81 mg total) by mouth daily. 02/27/18  Yes Black, Karen M, NP  °atorvastatin (LIPITOR) 40 MG tablet Take 1 tablet (40 mg total) by mouth daily. 11/12/20  Yes Thukkani, Arun K, MD  °Cholecalciferol (VITAMIN D3 PO) Take  250 mg by mouth 4 (four) times daily.   Yes [provider]  °Cyanocobalamin (B-12 PO) Take 1 tablet by mouth daily.    Yes [provider]  °doxycycline (VIBRAMYCIN) 100 MG capsule Take 1 capsule (100 mg total) by mouth 2 (two) times daily for 10 days. 01/02/21 01/12/21 Yes Fry, Stephen A, MD  °furosemide (LASIX) 20 MG tablet Take one tablet twice a day EVERY OTHER DAY 11/26/20  Yes Thukkani, Arun K, MD  °levothyroxine (SYNTHROID) 112 MCG tablet Take 1 tablet (112 mcg total) by mouth daily before breakfast. 10/27/20  Yes Fry, Stephen A, MD  °Nutritional Supplements (JUICE PLUS FIBRE PO) Juice Plus   Yes [provider]  °predniSONE (DELTASONE) 5 MG tablet Take 1 tablet (5 mg total) by mouth daily with breakfast. 04/29/20  Yes Fry, Stephen A, MD  ° °Past Medical History:  °Diagnosis Date  ° Breast cancer (HCC)   ° R  ° CAROTID STENOSIS   ° Chronic venous insufficiency   ° CONTACT DERMATITIS   ° DUPUYTREN'S CONTRACTURE   ° Edema   ° pt  states had edema of feet and legs has been on lasix per dr. to help  ° GLUCOSE INTOLERANCE   ° HLD (hyperlipidemia)   ° Hypothyroidism   ° Dr. Rizvi in [place] endo   ° MENOPAUSAL SYNDROME   ° Obesity   ° Osteoarthritis   ° bilat hips, severe  ° PVD (peripheral vascular disease) (HCC)   ° mild carotid 11/05  ° VITAMIN D DEFICIENCY   ° °Past Surgical History:  °Procedure Laterality Date  ° 2D echo  11/29/2003  ° normal LV size. normal EF   °   BACK SURGERY  12/06/2016   L3-L5 laminectomies per Dr. Maryellen Pile at [place]  05/11/2011   right breast   BREAST LUMPECTOMY     right breast   [place]     incisional hernia   INGUINAL HERNIA REPAIR     JOINT REPLACEMENT Bilateral    Dr. Maureen Ralphs   normal caronary arteries     per pt. 2003   right rotator cuff surgery     [place]  05/2008 and 10/2008   B hip - alusio   Family  History  Problem Relation Age of Onset   Uterine cancer Mother 4   Cancer Mother    Ovarian cancer Mother    Heart disease Father        before age 49   Varicose Veins Father    Cancer Sister    Heart disease Brother        before age 97   Prostate cancer Maternal Uncle        diagnosed in his 57s   Prostate cancer Paternal Uncle        diagnosed in his 23s   Clotting disorder Maternal Grandmother    Pancreatic cancer Maternal Grandfather        diagnosed in his 75s   Breast cancer Daughter 43       BRCA negative (no BART)   Cancer Daughter    Colon cancer Cousin    Leukemia Cousin    Uterine cancer Cousin    Prostate cancer Cousin    Coronary artery disease Other        family hx - female 1st degree relative <60   Hypothyroidism Other        aunt    Diabetes Son    Anesthesia problems Neg Hx    Adrenal disorder Neg Hx    Esophageal cancer Neg Hx    Stomach cancer Neg Hx    Social History   Socioeconomic History   Marital status: Divorced    Spouse name: Not on file   Number of children: 5   Years of education: 1 year college   Highest education level: High school graduate  Occupational History   Not on file  Tobacco Use   Smoking status: Never    Passive exposure: Past   Smokeless tobacco: Never  Vaping Use   Vaping Use: Never used  Substance and Sexual Activity   Alcohol use: No    Alcohol/week: 0.0 standard drinks   Drug use: No   Sexual activity: Not Currently  Other Topics Concern   Not on file  Social History Narrative   Divorced; works part time   5 children   Social Determinants of Radio broadcast assistant Strain: Low Risk    Difficulty of Paying Living Expenses: Not hard at all  Food Insecurity: No Food Insecurity   Worried About Charity fundraiser in the Last Year: Never true   Arboriculturist in the Last Year: Never true  Transportation Needs: No Transportation Needs   Lack of Transportation (Medical): No   Lack of Transportation  (Non-Medical): No  Physical Activity: Sufficiently Active   Days of Exercise per Week: 3 days   Minutes of Exercise per Session: 60 min  Stress: No Stress Concern Present   Feeling of Stress :  Not at all  Social Connections: Moderately Isolated   Frequency of Communication with Friends and Family: More than three times a week   Frequency of Social Gatherings with Friends and Family: More than three times a week   Attends Religious Services: More than 4 times per year   Active Member of [employer] or Organizations: No   Attends Archivist Meetings: Never   Marital Status: Divorced  Human resources officer Violence: Not At Risk   Fear of Current or Ex-Partner: No   Emotionally Abused: No   Physically Abused: No   Sexually Abused: No   ROS-see HPI   += positive Constitutional:    weight loss, night sweats, fevers, chills, fatigue, lassitude. HEENT:    headaches, difficulty swallowing, tooth/dental problems, sore throat,       sneezing, itching, ear ache, nasal congestion, post nasal drip, snoring CV:    chest pain, orthopnea, PND, +swelling in lower extremities, anasarca,                                   dizziness, +palpitations Resp:  + shortness of breath with exertion or at rest.                +productive cough,   +non-productive cough, coughing up of blood.              +change in color of mucus.  wheezing.   Skin:    rash or lesions. GI:  No-   heartburn, indigestion, abdominal pain, nausea, vomiting, diarrhea,                 change in bowel habits, loss of appetite GU: dysuria, change in color of urine, no urgency or frequency.   flank pain. MS:   joint pain, stiffness, decreased range of motion, back pain.  OBJ- Physical Exam General- Alert, Oriented, Affect-appropriate, Distress- none acute, + obese Skin- rash-none, lesions- none, excoriation- none Lymphadenopathy- none Head- atraumatic            Eyes- Gross vision intact, PERRLA, conjunctivae and secretions clear             Ears- Hearing, canals-normal            Nose- Clear, no-Septal dev, mucus, polyps, erosion, perforation             Throat- Mallampati II , mucosa clear , drainage- none, tonsils- atrophic Neck- flexible , trachea midline, no stridor , thyroid nl, carotid no bruit Chest - symmetrical excursion , unlabored           Heart/CV- RRR , no murmur , no gallop  , no rub, nl s1 s2                           - JVD- none , edema- none, stasis changes- none, varices- none           Lung- +bibasilar crackles, wheeze- none, cough- none , dullness-none, rub- none           Chest wall-  Abd-  Br/ Gen/ Rectal- Not done, not indicated Extrem- +elastic hose Neuro- grossly intact to observation

## 2021-01-09 NOTE — Patient Instructions (Signed)
Order- lab   D-dimer       dx Dyspnea on exertion  Order- schedule HRCT chest ILD protocol     dx ILD

## 2021-01-11 ENCOUNTER — Encounter: Payer: Self-pay | Admitting: Internal Medicine

## 2021-01-11 NOTE — Assessment & Plan Note (Signed)
She describes occasional episodes of exertional dyspnea associated with episodes of tachypalpitation.  That doesn't seem to fit with basilar crackles and abnormal Diffusion, which could refect interstitial lung disease. Showers or pulmonary emboli might do some of this. Plan- D-dimer, which mght lead to CTa chest for PE          HRCT, looking for ILD.   May have to decide how to consolidate or prioritize these.

## 2021-01-11 NOTE — Assessment & Plan Note (Signed)
No longer anemic at last checck

## 2021-01-13 ENCOUNTER — Telehealth: Payer: Self-pay | Admitting: Adult Health

## 2021-01-13 ENCOUNTER — Telehealth: Payer: Self-pay | Admitting: Internal Medicine

## 2021-01-13 ENCOUNTER — Other Ambulatory Visit: Payer: Self-pay

## 2021-01-13 ENCOUNTER — Other Ambulatory Visit: Payer: Self-pay | Admitting: Internal Medicine

## 2021-01-13 DIAGNOSIS — R0600 Dyspnea, unspecified: Secondary | ICD-10-CM

## 2021-01-13 DIAGNOSIS — R7989 Other specified abnormal findings of blood chemistry: Secondary | ICD-10-CM

## 2021-01-13 NOTE — Progress Notes (Signed)
Orders placed for CTA per Dr.Young.  I have called the patient to make her aware as well and left a message.

## 2021-01-13 NOTE — Telephone Encounter (Signed)
Patient called in with questions regarding D Dimer and CT scan . Her son was on the line as well. Read over Dr. Annamaria Boots  notes, advised they are ordering scan to look for cause of dyspnea. This will check to see if PE as D Dimer is elevated. Explained that D Dimer only says more testing is indicated not that she has a PE. Advised our office will reach out tomorrow with scan appointment date and time   If she develops increased dyspnea, chest pain, hemoptysis she is to see emergency care  Please contact office for sooner follow up if symptoms do not improve or worsen or seek emergency care   Call our office tomorrow if she has not heard from them at lunch time.

## 2021-01-13 NOTE — Telephone Encounter (Signed)
I called and spoke with patient and she voices understanding of her results and she did not have any questions.   She is aware that we are getting a scan for the possible blood clot. Noting further needed.

## 2021-01-13 NOTE — Addendum Note (Signed)
Addended by: Dessie Coma on: 01/13/2021 11:51 AM   Modules accepted: Orders

## 2021-01-14 ENCOUNTER — Ambulatory Visit (HOSPITAL_COMMUNITY)
Admission: RE | Admit: 2021-01-14 | Discharge: 2021-01-14 | Disposition: A | Discharge status: Home / Self Care | Payer: Medicare HMO | Source: Ambulatory Visit | Attending: Internal Medicine | Admitting: Internal Medicine

## 2021-01-14 ENCOUNTER — Other Ambulatory Visit: Payer: Self-pay

## 2021-01-14 DIAGNOSIS — I517 Cardiomegaly: Secondary | ICD-10-CM | POA: Diagnosis not present

## 2021-01-14 DIAGNOSIS — R7989 Other specified abnormal findings of blood chemistry: Secondary | ICD-10-CM

## 2021-01-14 DIAGNOSIS — R0600 Dyspnea, unspecified: Secondary | ICD-10-CM | POA: Diagnosis not present

## 2021-01-14 DIAGNOSIS — R0602 Shortness of breath: Secondary | ICD-10-CM | POA: Diagnosis not present

## 2021-01-14 MED ORDER — TECHNETIUM TO 99M ALBUMIN AGGREGATED
3.9500 | Freq: Once | INTRAVENOUS | Status: AC | PRN
  Administered 2021-01-14: 3.95 via INTRAVENOUS

## 2021-01-14 NOTE — Telephone Encounter (Signed)
Patient has already had scan completed.

## 2021-01-19 ENCOUNTER — Encounter: Payer: Self-pay | Admitting: Family Medicine

## 2021-01-19 ENCOUNTER — Ambulatory Visit (INDEPENDENT_AMBULATORY_CARE_PROVIDER_SITE_OTHER): Payer: Medicare HMO | Admitting: Family Medicine

## 2021-01-19 VITALS — BP 118/64 | HR 55 | Temp 98.5°F | Wt 227.0 lb

## 2021-01-19 DIAGNOSIS — H02403 Unspecified ptosis of bilateral eyelids: Secondary | ICD-10-CM | POA: Diagnosis not present

## 2021-01-19 NOTE — Progress Notes (Signed)
° °  Subjective:    Patient ID: Rachael [name], female    DOB: 1933/05/25, 88 y.o.   MRN: [id number]  HPI Here to discuss a referral to have her drooping eyelids fixed. Over the years this has progressed to the point that they are now in her visual fields. She has seen Dr. Luberta Mutter in the past for cataract surgery.   Review of Systems  Constitutional: Negative.   Eyes:  Positive for visual disturbance.  Respiratory:  Positive for shortness of breath.   Cardiovascular:  Positive for leg swelling.      Objective:   Physical Exam Constitutional:      Appearance: Normal appearance.     Comments: Walks with her walker   Eyes:     Comments: Both eyelids droop to the tops of the irises   Cardiovascular:     Rate and Rhythm: Normal rate and regular rhythm.     Pulses: Normal pulses.     Heart sounds: Normal heart sounds.  Pulmonary:     Effort: Pulmonary effort is normal.     Breath sounds: Normal breath sounds.  Neurological:     Mental Status: She is alert.          Assessment & Plan:  Ptosis, we will refer her to Dr. Ellie Lunch to discuss surgery. Alysia Penna, MD

## 2021-01-21 NOTE — Addendum Note (Signed)
Addended by: Alysia Penna A on: 01/21/2021 02:50 PM   Modules accepted: Orders

## 2021-01-23 ENCOUNTER — Telehealth: Payer: Self-pay | Admitting: Family Medicine

## 2021-01-23 NOTE — Telephone Encounter (Signed)
[facility] phone #- [phone].    Please advise

## 2021-01-23 NOTE — Addendum Note (Signed)
Addended by: Alysia Penna A on: 01/23/2021 04:28 PM   Modules accepted: Orders

## 2021-01-23 NOTE — Telephone Encounter (Signed)
In that case Rachael [name] could call them directly. No referral is needed

## 2021-01-23 NOTE — Telephone Encounter (Signed)
[facility] called in regards to the referral sent in for patients. She stated that patient would not be able to be seen in their office as they do not treat drooping eyelids. She said that she could go to [facility], aesthetic surgery in [place].     Please advise

## 2021-01-23 NOTE — Telephone Encounter (Signed)
Actually I can refer her to [facility], so I did this

## 2021-02-06 ENCOUNTER — Telehealth: Payer: Self-pay

## 2021-02-06 DIAGNOSIS — H53483 Generalized contraction of visual field, bilateral: Secondary | ICD-10-CM | POA: Diagnosis not present

## 2021-02-06 DIAGNOSIS — H02835 Dermatochalasis of left lower eyelid: Secondary | ICD-10-CM | POA: Diagnosis not present

## 2021-02-06 DIAGNOSIS — H02413 Mechanical ptosis of bilateral eyelids: Secondary | ICD-10-CM | POA: Diagnosis not present

## 2021-02-06 DIAGNOSIS — H02834 Dermatochalasis of left upper eyelid: Secondary | ICD-10-CM | POA: Diagnosis not present

## 2021-02-06 DIAGNOSIS — H02831 Dermatochalasis of right upper eyelid: Secondary | ICD-10-CM | POA: Diagnosis not present

## 2021-02-06 DIAGNOSIS — H57813 Brow ptosis, bilateral: Secondary | ICD-10-CM | POA: Diagnosis not present

## 2021-02-06 DIAGNOSIS — H02423 Myogenic ptosis of bilateral eyelids: Secondary | ICD-10-CM | POA: Diagnosis not present

## 2021-02-06 DIAGNOSIS — H02832 Dermatochalasis of right lower eyelid: Secondary | ICD-10-CM | POA: Diagnosis not present

## 2021-02-06 DIAGNOSIS — H0279 Other degenerative disorders of eyelid and periocular area: Secondary | ICD-10-CM | POA: Diagnosis not present

## 2021-02-06 NOTE — Telephone Encounter (Signed)
Patient called requesting to stop taking Prednisone.   Patient asked is it okay to just stop or should she taper down?   Patient is going out of town on Sunday, will call office when return for reply. Please advise

## 2021-02-09 NOTE — Telephone Encounter (Signed)
Tell her to take one tablet every other day for one week, and then stop it

## 2021-02-11 NOTE — Telephone Encounter (Signed)
Left pt  a detailed message with Dr Sarajane Jews advise

## 2021-02-12 ENCOUNTER — Ambulatory Visit: Payer: PPO | Admitting: Internal Medicine

## 2021-02-16 DIAGNOSIS — M79672 Pain in left foot: Secondary | ICD-10-CM | POA: Diagnosis not present

## 2021-02-16 DIAGNOSIS — M79671 Pain in right foot: Secondary | ICD-10-CM | POA: Diagnosis not present

## 2021-02-16 DIAGNOSIS — S92515A Nondisplaced fracture of proximal phalanx of left lesser toe(s), initial encounter for closed fracture: Secondary | ICD-10-CM | POA: Diagnosis not present

## 2021-02-16 DIAGNOSIS — L84 Corns and callosities: Secondary | ICD-10-CM | POA: Diagnosis not present

## 2021-02-17 ENCOUNTER — Ambulatory Visit: Payer: Self-pay | Admitting: Internal Medicine

## 2021-02-19 ENCOUNTER — Other Ambulatory Visit: Payer: PPO

## 2021-02-19 ENCOUNTER — Other Ambulatory Visit: Payer: Self-pay

## 2021-02-19 DIAGNOSIS — I6523 Occlusion and stenosis of bilateral carotid arteries: Secondary | ICD-10-CM | POA: Diagnosis not present

## 2021-02-19 DIAGNOSIS — R001 Bradycardia, unspecified: Secondary | ICD-10-CM

## 2021-02-19 DIAGNOSIS — R0600 Dyspnea, unspecified: Secondary | ICD-10-CM | POA: Diagnosis not present

## 2021-02-19 DIAGNOSIS — E785 Hyperlipidemia, unspecified: Secondary | ICD-10-CM

## 2021-02-20 ENCOUNTER — Encounter: Payer: Self-pay | Admitting: Family Medicine

## 2021-02-20 ENCOUNTER — Ambulatory Visit (INDEPENDENT_AMBULATORY_CARE_PROVIDER_SITE_OTHER): Payer: Medicare HMO | Admitting: Family Medicine

## 2021-02-20 VITALS — BP 126/72 | HR 54 | Temp 98.8°F | Resp 16 | Ht 67.0 in

## 2021-02-20 DIAGNOSIS — R059 Cough, unspecified: Secondary | ICD-10-CM

## 2021-02-20 DIAGNOSIS — J069 Acute upper respiratory infection, unspecified: Secondary | ICD-10-CM | POA: Diagnosis not present

## 2021-02-20 DIAGNOSIS — J029 Acute pharyngitis, unspecified: Secondary | ICD-10-CM | POA: Diagnosis not present

## 2021-02-20 LAB — LIPID PANEL
Chol/HDL Ratio: 2.5 ratio (ref 0.0–4.4)
Cholesterol, Total: 170 mg/dL (ref 100–199)
HDL: 69 mg/dL (ref >39)
LDL Chol Calc (NIH): 83 mg/dL (ref 0–99)
Triglycerides: 101 mg/dL (ref 0–149)
VLDL Cholesterol Cal: 18 mg/dL (ref 5–40)

## 2021-02-20 LAB — HEPATIC FUNCTION PANEL
ALT: 11 IU/L (ref 0–32)
AST: 18 IU/L (ref 0–40)
Albumin: 3.9 g/dL (ref 3.6–4.6)
Alkaline Phosphatase: 81 IU/L (ref 44–121)
Bilirubin Total: 1.1 mg/dL (ref 0.0–1.2)
Bilirubin, Direct: 0.28 mg/dL (ref 0.00–0.40)
Total Protein: 6 g/dL (ref 6.0–8.5)

## 2021-02-20 LAB — POCT RAPID STREP A (OFFICE): Rapid Strep A Screen: NEGATIVE

## 2021-02-20 LAB — POCT INFLUENZA A/B: Influenza A, POC: NEGATIVE

## 2021-02-20 LAB — POC COVID19 BINAXNOW: SARS Coronavirus 2 Ag: NEGATIVE

## 2021-02-20 MED ORDER — FLUTICASONE PROPIONATE 50 MCG/ACT NA SUSP
1.0000 | Freq: Two times a day (BID) | NASAL | 0 refills | Status: DC
Start: 2021-02-20 — End: 2021-06-01

## 2021-02-20 MED ORDER — BENZONATATE 100 MG PO CAPS: 100.0000 mg | ORAL_CAPSULE | Freq: Two times a day (BID) | ORAL | 0 refills | Status: AC | PRN

## 2021-02-20 NOTE — Patient Instructions (Addendum)
A few things to remember from today's visit:  Cough, unspecified type - Plan: POC COVID-19, POC Influenza A/B, POC Rapid Strep A, DG Chest 2 View, benzonatate (TESSALON) 100 MG capsule  Sore throat - Plan: POC COVID-19, POC Influenza A/B, POC Rapid Strep A  URI, acute  If you need refills please call your pharmacy. Do not use My Chart to request refills or for acute issues that need immediate attention.   Nasal saline irrigations as needed. Flonase to help with nasal congestion. Throat lozenges. Caution with cold meds. Cold may become more productive, plain Mucinex may help. Monitor for new symptoms and fever.  Please be sure medication list is accurate. If a new problem present, please set up appointment sooner than planned today.
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
## 2021-02-23 ENCOUNTER — Ambulatory Visit: Payer: PPO | Admitting: Internal Medicine

## 2021-02-23 ENCOUNTER — Telehealth: Payer: Self-pay

## 2021-02-23 DIAGNOSIS — E785 Hyperlipidemia, unspecified: Secondary | ICD-10-CM

## 2021-02-23 MED ORDER — ATORVASTATIN CALCIUM 80 MG PO TABS: 80.0000 mg | ORAL_TABLET | Freq: Every day | ORAL | 3 refills | Status: DC

## 2021-02-23 NOTE — Telephone Encounter (Signed)
Patient made made aware of results. Pt verbalized understanding and is agreeable to increasing Lipitor and seeing pharmacy.

## 2021-02-23 NOTE — Telephone Encounter (Signed)
-----   Message from Early Osmond, MD sent at 02/20/2021  7:19 AM EST ----- Let her know LDL needs to be less than 70.  Have her increase atorva to 80 and see pharmacy for lipid mgmt.

## 2021-02-24 ENCOUNTER — Encounter: Payer: Self-pay | Admitting: Family Medicine

## 2021-02-24 ENCOUNTER — Ambulatory Visit (INDEPENDENT_AMBULATORY_CARE_PROVIDER_SITE_OTHER): Payer: Medicare HMO | Admitting: Family Medicine

## 2021-02-24 VITALS — BP 118/60 | HR 57 | Temp 99.1°F

## 2021-02-24 DIAGNOSIS — J40 Bronchitis, not specified as acute or chronic: Secondary | ICD-10-CM | POA: Diagnosis not present

## 2021-02-24 MED ORDER — HYDROCODONE BIT-HOMATROP MBR 5-1.5 MG/5ML PO SOLN: 5.0000 mL | ORAL | 0 refills | Status: DC | PRN

## 2021-02-24 MED ORDER — AZITHROMYCIN 250 MG PO TABS: ORAL_TABLET | ORAL | 0 refills | Status: DC

## 2021-02-24 MED ORDER — ASPIRIN 81 MG PO TBEC: DELAYED_RELEASE_TABLET | ORAL | Status: DC

## 2021-02-24 NOTE — Progress Notes (Signed)
° °  Subjective:    Patient ID: Rachael [name], female    DOB: 1933-08-13, 88 y.o.   MRN: [id number]  HPI Here for 7 days of stuffy head, PND, ST, and coughing up green sputum which is sometimes flecked with blood. No fever or chest pain or SOB. She had a lot of eye irritation with mucus production at first, but this has improved. She was seen here a few days ago and it was felt she had a viral URI. She was given Benzonatate for the cough, but this has not helped. She is drinking fluids.    Review of Systems  Constitutional: Negative.   HENT:  Positive for congestion, postnasal drip and sore throat.   Eyes:  Positive for discharge and redness. Negative for photophobia, pain and visual disturbance.  Respiratory:  Positive for cough. Negative for shortness of breath and wheezing.   Cardiovascular: Negative.       Objective:   Physical Exam Constitutional:      Appearance: Normal appearance.  HENT:     Right Ear: Tympanic membrane, ear canal and external ear normal.     Left Ear: Tympanic membrane, ear canal and external ear normal.     Nose: Nose normal.     Mouth/Throat:     Pharynx: Oropharynx is clear.  Eyes:     Comments: Both conjunctivae are pink, no mucus is seen   Pulmonary:     Effort: Pulmonary effort is normal.     Breath sounds: Normal breath sounds.  Lymphadenopathy:     Cervical: No cervical adenopathy.  Neurological:     Mental Status: She is alert.          Assessment & Plan:  Bronchitis, treat with a Zpack. Use Hycodan for the cough.  Alysia Penna, MD

## 2021-03-10 ENCOUNTER — Telehealth: Payer: Self-pay | Admitting: Family Medicine

## 2021-03-10 ENCOUNTER — Encounter: Payer: Self-pay | Admitting: Family Medicine

## 2021-03-10 ENCOUNTER — Ambulatory Visit (INDEPENDENT_AMBULATORY_CARE_PROVIDER_SITE_OTHER): Payer: Medicare HMO | Admitting: Family Medicine

## 2021-03-10 VITALS — HR 56 | Temp 99.6°F

## 2021-03-10 DIAGNOSIS — M10032 Idiopathic gout, left wrist: Secondary | ICD-10-CM | POA: Diagnosis not present

## 2021-03-10 MED ORDER — COLCHICINE 0.6 MG PO TABS: 0.6000 mg | ORAL_TABLET | Freq: Four times a day (QID) | ORAL | 1 refills | Status: DC | PRN

## 2021-03-10 MED ORDER — TRAMADOL HCL 50 MG PO TABS: 100.0000 mg | ORAL_TABLET | Freq: Three times a day (TID) | ORAL | 2 refills | Status: DC | PRN

## 2021-03-10 NOTE — Telephone Encounter (Signed)
Patient called to follow up that a her colchicine will have a reaction to her atorvastatin and the pharmacy is requesting a call before they fill it       Please advise

## 2021-03-10 NOTE — Telephone Encounter (Signed)
Please advise 

## 2021-03-10 NOTE — Telephone Encounter (Signed)
Rachael [name] with [facility] is calling and there is a drug interactions colchicine 0.6 MG tablet and atorvastatin/liptor there is increase chance of muscle pain and breakdown. Pt was seen today. Please advise

## 2021-03-10 NOTE — Telephone Encounter (Signed)
Please call her pharmacy and instruct her to stop taking the Atorvastatin until she is finished taking the Colchicine. Then she can resume taking it after that

## 2021-03-10 NOTE — Progress Notes (Signed)
° °  Subjective:    Patient ID: Rachael [name], female    DOB: May 12, 1933, 88 y.o.   MRN: [id number]  HPI Here for the sudden onset yesterday of swelling and severe pain in the left wrist. No recent trauma. This happened once before in October 2016, and this was diagnosed as gout.    Review of Systems  Constitutional: Negative.   Respiratory: Negative.    Cardiovascular: Negative.   Musculoskeletal:  Positive for arthralgias.      Objective:   Physical Exam Constitutional:      Appearance: Normal appearance.     Comments: In a wheelchair   Cardiovascular:     Rate and Rhythm: Normal rate and regular rhythm.     Pulses: Normal pulses.     Heart sounds: Normal heart sounds.  Pulmonary:     Effort: Pulmonary effort is normal.     Breath sounds: No wheezing or rales.     Comments: Bibasilar crackles  Musculoskeletal:     Comments: The left wrist is red, warm, swollen, and very tender   Neurological:     Mental Status: She is alert.          Assessment & Plan:  Acute gout. Treat with Colchicine 0.6 mg every 6 hours as needed. Add Tramadol for pain relief.  Alysia Penna, MD

## 2021-03-11 NOTE — Telephone Encounter (Signed)
Spoke with pt pharmacy regarding new Rx for Colchicine, advised per Dr Sarajane Jews to fill Rx since pt is aware to stop taking Atorvastatin until she is finished taking Colchicine, Spoke with pt daughter advised of Dr Sarajane Jews recommendations , verbalized understanding ?

## 2021-03-11 NOTE — Telephone Encounter (Signed)
Patient daughter Rachael [name] called in requesting to speak with Rachael [name] regarding the medication interaction. ? ?Rachael [name] could be contacted at [phone]. ? ?Please advise. ?

## 2021-03-12 ENCOUNTER — Telehealth: Payer: Self-pay | Admitting: Family Medicine

## 2021-03-12 NOTE — Telephone Encounter (Signed)
Please advise 

## 2021-03-12 NOTE — Telephone Encounter (Signed)
Dr Sarajane Jews put pt on Colchicine and it has given her diarrhea.  She is requesting to take Predisone instead and she wants to know how much to take of that medication as well.   ?

## 2021-03-13 ENCOUNTER — Telehealth: Payer: Self-pay

## 2021-03-13 ENCOUNTER — Other Ambulatory Visit: Payer: Self-pay

## 2021-03-13 DIAGNOSIS — M10032 Idiopathic gout, left wrist: Secondary | ICD-10-CM

## 2021-03-13 MED ORDER — METHYLPREDNISOLONE 4 MG PO TBPK: ORAL_TABLET | ORAL | 0 refills | Status: DC

## 2021-03-13 NOTE — Telephone Encounter (Signed)
Spoke with patient Rachael [name].   Colchicine 0.6 tabs was giving patient diarrhea.   Medrol dosepak was sent to [facility]. ?

## 2021-03-13 NOTE — Telephone Encounter (Signed)
Daughter called asking if another Rx can be sent to the pharmacy the prednisone is giving Patient diarrhea please call [phone] ?

## 2021-03-13 NOTE — Telephone Encounter (Signed)
Stop the Colchicine and call in a 4 mg Medrol dose pack to take as directed  ?

## 2021-03-13 NOTE — Telephone Encounter (Signed)
Pt is calling back and she is aware dr fry in office and would like a callback concerning the below message ?

## 2021-03-13 NOTE — Telephone Encounter (Signed)
Spoke with patient. Medication sent to  [facility] ?

## 2021-03-16 DIAGNOSIS — S92515A Nondisplaced fracture of proximal phalanx of left lesser toe(s), initial encounter for closed fracture: Secondary | ICD-10-CM | POA: Diagnosis not present

## 2021-03-18 ENCOUNTER — Ambulatory Visit: Payer: Medicare HMO | Admitting: Family Medicine

## 2021-03-19 ENCOUNTER — Ambulatory Visit (INDEPENDENT_AMBULATORY_CARE_PROVIDER_SITE_OTHER): Payer: Medicare HMO | Admitting: Family Medicine

## 2021-03-19 ENCOUNTER — Encounter: Payer: Self-pay | Admitting: Family Medicine

## 2021-03-19 VITALS — BP 120/68 | HR 56 | Temp 98.0°F

## 2021-03-19 DIAGNOSIS — R635 Abnormal weight gain: Secondary | ICD-10-CM | POA: Diagnosis not present

## 2021-03-19 DIAGNOSIS — M10032 Idiopathic gout, left wrist: Secondary | ICD-10-CM

## 2021-03-19 DIAGNOSIS — E039 Hypothyroidism, unspecified: Secondary | ICD-10-CM | POA: Diagnosis not present

## 2021-03-19 DIAGNOSIS — J40 Bronchitis, not specified as acute or chronic: Secondary | ICD-10-CM

## 2021-03-19 DIAGNOSIS — E739 Lactose intolerance, unspecified: Secondary | ICD-10-CM

## 2021-03-19 LAB — CBC WITH DIFFERENTIAL/PLATELET
Basophils Absolute: 0.1 10*3/uL (ref 0.0–0.1)
Basophils Relative: 0.6 % (ref 0.0–3.0)
Eosinophils Absolute: 0.3 10*3/uL (ref 0.0–0.7)
Eosinophils Relative: 2.9 % (ref 0.0–5.0)
HCT: 40.7 % (ref 36.0–46.0)
Hemoglobin: 13.6 g/dL (ref 12.0–15.0)
Lymphocytes Relative: 26 % (ref 12.0–46.0)
Lymphs Abs: 2.7 10*3/uL (ref 0.7–4.0)
MCHC: 33.3 g/dL (ref 30.0–36.0)
MCV: 88.1 fl (ref 78.0–100.0)
Monocytes Absolute: 1 10*3/uL (ref 0.1–1.0)
Monocytes Relative: 9.7 % (ref 3.0–12.0)
Neutro Abs: 6.4 10*3/uL (ref 1.4–7.7)
Neutrophils Relative %: 60.8 % (ref 43.0–77.0)
Platelets: 451 10*3/uL — ABNORMAL HIGH (ref 150.0–400.0)
RBC: 4.62 Mil/uL (ref 3.87–5.11)
RDW: 13.7 % (ref 11.5–15.5)
WBC: 10.5 10*3/uL (ref 4.0–10.5)

## 2021-03-19 LAB — BASIC METABOLIC PANEL
BUN: 23 mg/dL (ref 6–23)
CO2: 28 mEq/L (ref 19–32)
Calcium: 10.2 mg/dL (ref 8.4–10.5)
Chloride: 104 mEq/L (ref 96–112)
Creatinine, Ser: 1 mg/dL (ref 0.40–1.20)
GFR: 51.46 mL/min — ABNORMAL LOW (ref >60.00)
Glucose, Bld: 93 mg/dL (ref 70–99)
Potassium: 4.3 mEq/L (ref 3.5–5.1)
Sodium: 142 mEq/L (ref 135–145)

## 2021-03-19 LAB — T4, FREE: Free T4: 1.85 ng/dL — ABNORMAL HIGH (ref 0.60–1.60)

## 2021-03-19 LAB — HEMOGLOBIN A1C: Hgb A1c MFr Bld: 6.1 % (ref 4.6–6.5)

## 2021-03-19 LAB — TSH: TSH: 0.7 u[IU]/mL (ref 0.35–5.50)

## 2021-03-19 LAB — T3, FREE: T3, Free: 3 pg/mL (ref 2.3–4.2)

## 2021-03-19 MED ORDER — PREDNISONE 10 MG PO TABS: ORAL_TABLET | ORAL | 0 refills | Status: DC

## 2021-03-19 MED ORDER — DOXYCYCLINE HYCLATE 100 MG PO CAPS: 100.0000 mg | ORAL_CAPSULE | Freq: Two times a day (BID) | ORAL | 0 refills | Status: AC

## 2021-03-19 NOTE — Progress Notes (Signed)
? ?  Subjective:  ? ? Patient ID: Rachael [name], female    DOB: 25-Feb-1933, 88 y.o.   MRN: [id number] ? ?HPI ?Here for ongoing issues. First she continues to have a cough that produces green sputum. This started 5 weeks ago after she got home from a cruise. She was given a Zpack on 02-24-21 with no effect. No fever or chest pain or SOB. Her last CXR on 01-14-21 was clear. Her ECHO on 11-24-20 showed an EF of 60-65% with Grade I diastolic dysfunction. Also she saw [place] on 03-10-21 for a gout attack in the left wrisr. She was given a Medrol dose pack, and this helped to some degree. The swelling is down and the pain is not as intense, but she still has significant pain. Lastly she says she has gained 20 lbs in the past 6 months for no apparent reason. Her diet has not changed. Her last TSH was in November.  ? ? ?Review of Systems  ?Constitutional:  Positive for unexpected weight change. Negative for chills, diaphoresis and fever.  ?HENT: Negative.    ?Eyes: Negative.   ?Respiratory:  Positive for cough. Negative for shortness of breath and wheezing.   ?Cardiovascular: Negative.   ?Musculoskeletal:  Positive for arthralgias.  ? ?   ?Objective:  ? Physical Exam ?Constitutional:   ?   Appearance: She is not ill-appearing.  ?   Comments: Using her walker   ?Cardiovascular:  ?   Rate and Rhythm: Normal rate and regular rhythm.  ?   Pulses: Normal pulses.  ?   Heart sounds: Normal heart sounds.  ?Pulmonary:  ?   Effort: Pulmonary effort is normal.  ?   Breath sounds: Normal breath sounds.  ?Musculoskeletal:  ?   Comments: The left wrist looks normal with no erythema or swelling. She is still very tender over the first MTP joint and the wrist   ?Lymphadenopathy:  ?   Cervical: No cervical adenopathy.  ?Neurological:  ?   Mental Status: She is alert.  ? ? ? ? ? ?   ?Assessment & Plan:  ?The cough seems to be from an atypical infection, so we will treat this with 10 days of Doxycycline. Treat the gout with a 12 day taper of Prednisone  beginning with 40 mg a day. For the weight gain, we will check a CBC, BMET, and a thyroid panel. We spent a total of ( 30  ) minutes reviewing records and discussing these issues.  ?Alysia Penna, MD ? ? ?

## 2021-03-24 ENCOUNTER — Ambulatory Visit: Payer: Medicare HMO

## 2021-03-26 ENCOUNTER — Ambulatory Visit: Payer: Self-pay | Admitting: Internal Medicine

## 2021-04-01 ENCOUNTER — Telehealth: Payer: Self-pay | Admitting: Family Medicine

## 2021-04-01 DIAGNOSIS — R001 Bradycardia, unspecified: Secondary | ICD-10-CM

## 2021-04-01 DIAGNOSIS — I471 Supraventricular tachycardia: Secondary | ICD-10-CM

## 2021-04-01 NOTE — Telephone Encounter (Signed)
Dr. Rex Kras at [facility]  ?

## 2021-04-01 NOTE — Telephone Encounter (Signed)
Pt has irregular heart beat and does not want to go back dr Lovena Le and would like a referral to dr Rex Kras phone number [phone]  and address [place] has [employer] insurance ?

## 2021-04-02 NOTE — Telephone Encounter (Signed)
Spoke with pt verbalized understanding that Dr Sarajane Jews placed referral as requested, advised that  Dr Elayne Snare office will call her for scheduling ?

## 2021-04-02 NOTE — Telephone Encounter (Signed)
I did the referral 

## 2021-04-03 ENCOUNTER — Ambulatory Visit: Payer: Medicare HMO | Admitting: Internal Medicine

## 2021-04-07 DIAGNOSIS — E039 Hypothyroidism, unspecified: Secondary | ICD-10-CM | POA: Diagnosis not present

## 2021-04-07 DIAGNOSIS — E559 Vitamin D deficiency, unspecified: Secondary | ICD-10-CM | POA: Diagnosis not present

## 2021-04-07 DIAGNOSIS — E063 Autoimmune thyroiditis: Secondary | ICD-10-CM | POA: Diagnosis not present

## 2021-04-08 DIAGNOSIS — H53483 Generalized contraction of visual field, bilateral: Secondary | ICD-10-CM | POA: Diagnosis not present

## 2021-04-09 ENCOUNTER — Ambulatory Visit
Admission: RE | Admit: 2021-04-09 | Discharge: 2021-04-09 | Disposition: A | Discharge status: Home / Self Care | Payer: Medicare HMO | Source: Ambulatory Visit | Attending: Cardiology | Admitting: Cardiology

## 2021-04-09 ENCOUNTER — Ambulatory Visit: Payer: Medicare HMO | Admitting: Cardiology

## 2021-04-09 ENCOUNTER — Encounter: Payer: Self-pay | Admitting: Cardiology

## 2021-04-09 VITALS — BP 128/82 | HR 77 | Temp 98.0°F | Resp 17 | Ht 67.0 in | Wt 220.0 lb

## 2021-04-09 DIAGNOSIS — I471 Supraventricular tachycardia: Secondary | ICD-10-CM

## 2021-04-09 DIAGNOSIS — E039 Hypothyroidism, unspecified: Secondary | ICD-10-CM | POA: Diagnosis not present

## 2021-04-09 DIAGNOSIS — I495 Sick sinus syndrome: Secondary | ICD-10-CM

## 2021-04-09 DIAGNOSIS — R001 Bradycardia, unspecified: Secondary | ICD-10-CM

## 2021-04-09 DIAGNOSIS — I5189 Other ill-defined heart diseases: Secondary | ICD-10-CM

## 2021-04-09 DIAGNOSIS — E782 Mixed hyperlipidemia: Secondary | ICD-10-CM | POA: Diagnosis not present

## 2021-04-09 DIAGNOSIS — R0609 Other forms of dyspnea: Secondary | ICD-10-CM

## 2021-04-09 DIAGNOSIS — R0602 Shortness of breath: Secondary | ICD-10-CM | POA: Diagnosis not present

## 2021-04-09 NOTE — Progress Notes (Addendum)
? ?Date:  04/09/2021  ? ?ID:  [name], DOB [birth year], MRN [id number] ? ?PCP:  [name], MD  ?Cardiologist:  Rex Kras, DO, [facility] (established care 04/09/2021) ?Former Cardiology Providers: Dr. Ali Lowe and Dr. Lovena Le ? ?REASON FOR CONSULT: Bradycardia/SVT ? ?REQUESTING PHYSICIAN:  ?[name], MD ?[facility] ?[place],  [place] [id number] ? ?Chief Complaint  ?Patient presents with  ? Bradycardia  ? Shortness of Breath  ? Near Syncope  ? ? ?HPI  ?Rachael [name] is a 88 y.o. Caucasian female whose past medical history and cardiovascular risk factors include: Hypothyroidism, hyperlipidemia, diastolic dysfunction, sinus node dysfunction, advanced age, postmenopausal female. ? ?She is referred to the office at the request of [name], MD for evaluation of bradycardia/SVT. ? ?Since November/December 2022 patient has noted 2 episodes where she notes palpitations (fast heart rate) and soon after notices a slow heart rate.  During these episodes she has noticed the symptoms of lightheaded / dizziness and near syncope but has never passed out.  After these episode she has experienced shortness of breath for a short period of time which then resolves. She has seen Dr. Lovena Le in December 2022 for tachybrady syndrome and sinus pause greater than 3 second.  It was felt that she does have sinus node dysfunction and may require pacemaker in the future. ? ?Patient denies any chest pain at rest or with effort related activities. ? ?She ambulates with a walker and is able to do her activities of daily living, enjoys shopping, and recently went on a cruise.  When she overexerts herself she does not get short of breath which usually resolves with resting.  She has bilateral chronic venous insufficiency and wears compression stockings to help alleviate the swelling.  She plans to have some vein work done on April 28, 2021 with vascular and vein specialist. ? ?Does not check her blood pressures at home.  Office  blood pressure are elevated.  ? ?FUNCTIONAL STATUS: ?Ambulates with a walker.  No structured exercise program or daily routine.  But remains very active member of society. ? ?ALLERGIES: ?Allergies  ?Allergen Reactions  ? Prednisone Other (See Comments)  ?  Muscle weakness  ?Caused severe numbness and weakness of  extremity  ? Shellfish Allergy Anaphylaxis and Hives  ?  Hives all over the body. ?Hives all over the body  ? Percocet [Oxycodone-Acetaminophen] Hives  ? Iodinated Contrast Media Other (See Comments)  ?  PT IS NOT AWARE OF IODINE ALLERGY, PREMEDICATED FOR PRIOR PREMEDS ONLY//A.C. - unknown reaction  ? Iodine Hives  ? ? ?MEDICATION LIST PRIOR TO VISIT: ?Current Meds  ?Medication Sig  ? atorvastatin (LIPITOR) 80 MG tablet Take 1 tablet (80 mg total) by mouth daily.  ? Cholecalciferol (VITAMIN D3 PO) Take 250 mg by mouth 4 (four) times daily.  ? Cyanocobalamin (B-12 PO) Take 1 tablet by mouth daily.   ? furosemide (LASIX) 20 MG tablet Take one tablet twice a day EVERY OTHER DAY  ? levothyroxine (SYNTHROID) 112 MCG tablet Take 1 tablet (112 mcg total) by mouth daily before breakfast.  ? Nutritional Supplements (JUICE PLUS FIBRE PO) Juice Plus  ? predniSONE (DELTASONE) 10 MG tablet Take 4 tabs a day for 3 days, then 3 tabs a day for 3 days, then 2 tabs a day for 3 days, then 1 tab a day for 3 days, then stop  ? traMADol (ULTRAM) 50 MG tablet Take 2 tablets (100 mg total) by mouth every 8 (  eight) hours as needed for moderate pain.  ?  ? ?PAST MEDICAL HISTORY: ?Past Medical History:  ?Diagnosis Date  ? Breast cancer ([place])   ? R  ? CAROTID STENOSIS   ? Chronic venous insufficiency   ? CONTACT DERMATITIS   ? DUPUYTREN'S CONTRACTURE   ? Edema   ? pt  states had edema of feet and legs has been on lasix per dr. to help  ? GLUCOSE INTOLERANCE   ? HLD (hyperlipidemia)   ? Hypothyroidism   ? Dr. Ala Dach in [place]   ? MENOPAUSAL SYNDROME   ? Obesity   ? Osteoarthritis   ? bilat hips, severe  ? PVD (peripheral  vascular disease) ([place])   ? mild carotid 11/05  ? VITAMIN D DEFICIENCY   ? ? ?PAST SURGICAL HISTORY: ?Past Surgical History:  ?Procedure Laterality Date  ? 2D echo  11/29/2003  ? normal LV size. normal EF   ? BACK SURGERY  12/06/2016  ? L3-L5 laminectomies per Dr. Maryellen Pile at [facility]  ? BREAST BIOPSY  05/11/2011  ? right breast  ? BREAST LUMPECTOMY    ? right breast  ? CHOLECYSTECTOMY    ? FINGER SURGERY    ? HERNIA REPAIR    ? incisional hernia  ? INGUINAL HERNIA REPAIR    ? JOINT REPLACEMENT Bilateral   ? Dr. Maureen Ralphs  ? normal caronary arteries    ? per pt. 2003  ? right rotator cuff surgery    ? SPINE SURGERY    ? TONSILLECTOMY    ? TOTAL HIP ARTHROPLASTY  05/2008 and 10/2008  ? B hip - alusio  ? ? ?FAMILY HISTORY: ?The patient family history includes Breast cancer (age of onset: 38) in her daughter; Cancer in her daughter and mother; Clotting disorder in her maternal grandmother; Colon cancer in her cousin; Coronary artery disease in an other family member; Diabetes in her son; Heart disease in her brother and father; Hypothyroidism in an other family member; Leukemia in her cousin; Ovarian cancer in her mother; Pancreatic cancer in her maternal grandfather; Prostate cancer in her cousin, maternal uncle, and paternal uncle; Uterine cancer in her cousin; Uterine cancer (age of onset: 63) in her mother; Varicose Veins in her father. ? ?SOCIAL HISTORY:  ?The patient  reports that she has never smoked. She has been exposed to tobacco smoke. She has never used smokeless tobacco. She reports that she does not drink alcohol and does not use drugs. ? ?REVIEW OF SYSTEMS: ?Review of Systems  ?Cardiovascular:  Positive for dyspnea on exertion, leg swelling and palpitations. Negative for chest pain, orthopnea and syncope.  ?Respiratory:  Positive for shortness of breath.   ? ?PHYSICAL EXAM: ? ?  04/09/2021  ? 12:20 PM 04/09/2021  ? 11:04 AM 03/19/2021  ?  8:42 AM  ?Vitals with BMI  ?Height  '5\' 7"'$    ?Weight  220 lbs    ?BMI  34.45   ?Systolic 166 063 016  ?Diastolic 82 73 68  ?Pulse  77 56  ? ?Orthostatic VS for the past 72 hrs (Last 3 readings): ? Orthostatic BP Patient Position BP Location Cuff Size Orthostatic Pulse  ?04/09/21 1220 -- Sitting Left Arm Large --  ?04/09/21 1113 133/61 Sitting Left Arm Large 87  ?04/09/21 1112 117/57 Sitting Left Arm Large 70  ?04/09/21 1110 119/60 Supine Left Arm Large 64  ? ?CONSTITUTIONAL: Age-appropriate female, well-developed and well-nourished. No acute distress.  Ambulates with a walker. ?SKIN: Skin is warm and  dry. No rash noted. No cyanosis. No pallor. No jaundice ?HEAD: Normocephalic and atraumatic.  ?EYES: No scleral icterus ?MOUTH/THROAT: Moist oral membranes.  ?NECK: No JVD present. No thyromegaly noted. No carotid bruits  ?LYMPHATIC: No visible cervical adenopathy.  ?CHEST Normal respiratory effort. No intercostal retractions  ?LUNGS: Clear to auscultation bilaterally in the upper lung fields with rhonchi and wheezes noted bilaterally at the bases.  Equal rise and fall of chest cavity.   ?CARDIOVASCULAR: Regular rate and rhythm, positive S1-S2, no murmurs rubs or gallops appreciated. ?ABDOMINAL: obese, soft, nontender, nondistended, positive bowel sounds in all 4 quadrants, no apparent ascites.  ?EXTREMITIES: Compression stockings present bilaterally up to the knees, +1 pitting edema, tender to touch, warm, dry skin.  ?HEMATOLOGIC: No significant bruising ?NEUROLOGIC: Oriented to person, place, and time. Nonfocal. Normal muscle tone.  ?PSYCHIATRIC: Normal mood and affect. Normal behavior. Cooperative ? ?CARDIAC DATABASE: ?EKG: ?04/09/2021: Normal sinus rhythm, 66 bpm, normal axis, without underlying ischemia or injury pattern. ? ?Echocardiogram: ?11/24/2020: ?LVEF 60-65%, no regional wall motion abnormalities, mild LVH, grade 1 diastolic dysfunction, moderately dilated left atrium, trivial MR, trivial AR, aortic root at 39 mm, proximal ascending aorta 40 mm, estimated RAP 3 mmHg,  small PFO with left-to-right shunting. ?  ? ?Stress Testing: ?No results found for this or any previous visit from the past 1095 days. ? ? ?Heart Catheterization: ?None ? ?Cardiac monitor: ?November 2022 ?P

## 2021-04-10 DIAGNOSIS — E063 Autoimmune thyroiditis: Secondary | ICD-10-CM | POA: Diagnosis not present

## 2021-04-10 DIAGNOSIS — R7309 Other abnormal glucose: Secondary | ICD-10-CM | POA: Diagnosis not present

## 2021-04-10 DIAGNOSIS — E559 Vitamin D deficiency, unspecified: Secondary | ICD-10-CM | POA: Diagnosis not present

## 2021-04-10 DIAGNOSIS — C50819 Malignant neoplasm of overlapping sites of unspecified female breast: Secondary | ICD-10-CM | POA: Diagnosis not present

## 2021-04-10 DIAGNOSIS — G729 Myopathy, unspecified: Secondary | ICD-10-CM | POA: Diagnosis not present

## 2021-04-10 DIAGNOSIS — E039 Hypothyroidism, unspecified: Secondary | ICD-10-CM | POA: Diagnosis not present

## 2021-04-10 LAB — BASIC METABOLIC PANEL
BUN/Creatinine Ratio: 17 (ref 12–28)
BUN: 18 mg/dL (ref 8–27)
CO2: 24 mmol/L (ref 20–29)
Calcium: 10.2 mg/dL (ref 8.7–10.3)
Chloride: 104 mmol/L (ref 96–106)
Creatinine, Ser: 1.05 mg/dL — ABNORMAL HIGH (ref 0.57–1.00)
Glucose: 99 mg/dL (ref 70–99)
Potassium: 4.5 mmol/L (ref 3.5–5.2)
Sodium: 143 mmol/L (ref 134–144)
eGFR: 52 mL/min/{1.73_m2} — ABNORMAL LOW (ref >59)

## 2021-04-10 LAB — PRO B NATRIURETIC PEPTIDE: NT-Pro BNP: 1048 pg/mL — ABNORMAL HIGH (ref 0–738)

## 2021-04-10 LAB — MAGNESIUM: Magnesium: 1.9 mg/dL (ref 1.6–2.3)

## 2021-04-13 DIAGNOSIS — S92515A Nondisplaced fracture of proximal phalanx of left lesser toe(s), initial encounter for closed fracture: Secondary | ICD-10-CM | POA: Diagnosis not present

## 2021-04-13 DIAGNOSIS — L84 Corns and callosities: Secondary | ICD-10-CM | POA: Diagnosis not present

## 2021-04-15 NOTE — Progress Notes (Signed)
Called and spoke to patient she is aware of results

## 2021-04-15 NOTE — Progress Notes (Signed)
Called pt no answer, could not leave a vm due to vm being full

## 2021-04-15 NOTE — Progress Notes (Signed)
Patient called back, I have discussed results with her and confirmed appointment for 05/08/21 @ 11:45

## 2021-04-16 ENCOUNTER — Telehealth: Payer: Self-pay | Admitting: Family Medicine

## 2021-04-16 ENCOUNTER — Encounter: Payer: Self-pay | Admitting: Family Medicine

## 2021-04-16 ENCOUNTER — Ambulatory Visit (INDEPENDENT_AMBULATORY_CARE_PROVIDER_SITE_OTHER): Payer: Medicare HMO | Admitting: Family Medicine

## 2021-04-16 VITALS — BP 124/80 | HR 64 | Temp 98.1°F | Ht 67.0 in

## 2021-04-16 DIAGNOSIS — R052 Subacute cough: Secondary | ICD-10-CM | POA: Diagnosis not present

## 2021-04-16 MED ORDER — PREDNISONE 1 MG PO TBEC: DELAYED_RELEASE_TABLET | ORAL | 0 refills | Status: DC

## 2021-04-16 NOTE — Telephone Encounter (Signed)
Katie pharm tech with [facility] is calling and needs directions on prednisone ?

## 2021-04-16 NOTE — Telephone Encounter (Signed)
Spoke with Rachael [name] and informed her per Dr Sarajane Jews the patient was given handwritten instructions on how to take the Prednisone.  Katie stated after reviewing the Rx, this was written for the delayed release pill and will cost $7000.  Dr Sarajane Jews was informed, stated he intended to write for the plain Prednisone '1mg'$  and Rachael [name] stated the Rx will be changed. ?

## 2021-04-16 NOTE — Progress Notes (Signed)
? ?  Subjective:  ? ? Patient ID: Rachael [name], female    DOB: 1933/05/15, 88 y.o.   MRN: [id number] ? ?HPI ?Here to check a lingering cough that started a few weeks ago. She was here on 02-24-21 for a bronchitis and was given a Zpack. This did not help so she returned on 03-19-21 ans was given a course of Doxycycline. This worked well and the cough resolved. Now for several weeks she has had a mild dry cough. No fever or SOB. She saw Dr. Terri Skains in Cardiology on 04-09-21 for her bradycardia/SVT syndrome, and he sent her for a CXR. This showed mild emphysema and chronic bronchitis changes. No pulmonary edema. Her ECHO on 11-24-20 showed an EF of 60-65% with mild LVH and Grade I diastolic dysfunction.  ? ? ?Review of Systems  ?Constitutional: Negative.   ?HENT: Negative.    ?Respiratory:  Positive for cough. Negative for chest tightness, shortness of breath and wheezing.   ?Cardiovascular:  Positive for leg swelling. Negative for chest pain.  ? ?   ?Objective:  ? Physical Exam ?Constitutional:   ?   Appearance: Normal appearance. She is not ill-appearing.  ?Cardiovascular:  ?   Rate and Rhythm: Normal rate and regular rhythm.  ?   Pulses: Normal pulses.  ?   Heart sounds: Normal heart sounds.  ?Pulmonary:  ?   Effort: Pulmonary effort is normal. No respiratory distress.  ?   Breath sounds: No wheezing or rales.  ?   Comments: Slight crackles at both bases  ?Neurological:  ?   Mental Status: She is alert.  ? ? ? ? ? ?   ?Assessment & Plan:  ?Her cough is likely related to seasonal allergies. She can take Delsum as needed and follow up if it gets any worse.  ?Alysia Penna, MD ? ? ?

## 2021-04-22 ENCOUNTER — Encounter: Payer: Self-pay | Admitting: Family Medicine

## 2021-04-22 ENCOUNTER — Ambulatory Visit (INDEPENDENT_AMBULATORY_CARE_PROVIDER_SITE_OTHER): Payer: Medicare HMO | Admitting: Family Medicine

## 2021-04-22 VITALS — BP 120/62 | HR 71 | Temp 98.8°F

## 2021-04-22 DIAGNOSIS — J40 Bronchitis, not specified as acute or chronic: Secondary | ICD-10-CM

## 2021-04-22 MED ORDER — BENZONATATE 200 MG PO CAPS: 200.0000 mg | ORAL_CAPSULE | Freq: Four times a day (QID) | ORAL | 0 refills | Status: DC | PRN

## 2021-04-22 MED ORDER — LEVOFLOXACIN 500 MG PO TABS: 500.0000 mg | ORAL_TABLET | Freq: Every day | ORAL | 0 refills | Status: DC

## 2021-04-22 NOTE — Progress Notes (Signed)
? ?  Subjective:  ? ? Patient ID: Rachael [name], female    DOB: July 24, 1933, 88 y.o.   MRN: [id number] ? ?HPI ?Here for PND, chest congestion, and coughing up yellow sputum that started about 6 weeks ago. On 02-24-21 she was given a Zpack, and on 03-19-21 she was given 10 days of Doxycycline. Each time she started to fel better butht hen the cough returned. No chest pain or SOB or fever.  ? ? ?Review of Systems  ?Constitutional: Negative.   ?HENT:  Positive for postnasal drip. Negative for congestion, ear pain, facial swelling, sinus pain and sore throat.   ?Eyes: Negative.   ?Respiratory:  Positive for cough. Negative for shortness of breath and wheezing.   ? ?   ?Objective:  ? Physical Exam ?Constitutional:   ?   Appearance: Normal appearance.  ?HENT:  ?   Right Ear: Tympanic membrane, ear canal and external ear normal.  ?   Left Ear: Tympanic membrane, ear canal and external ear normal.  ?   Nose: Nose normal.  ?   Mouth/Throat:  ?   Pharynx: Oropharynx is clear.  ?Eyes:  ?   Conjunctiva/sclera: Conjunctivae normal.  ?Pulmonary:  ?   Effort: Pulmonary effort is normal. No respiratory distress.  ?   Breath sounds: Normal breath sounds. No stridor. No wheezing, rhonchi or rales.  ?Lymphadenopathy:  ?   Cervical: No cervical adenopathy.  ?Neurological:  ?   Mental Status: She is alert.  ? ? ? ? ? ?   ?Assessment & Plan:  ?Bronchitis, we will treat this with 14 days of Levaquin. Use Benzonatate prn cough. Recheck as needed.  ?Alysia Penna, MD ? ? ?

## 2021-04-27 ENCOUNTER — Other Ambulatory Visit: Payer: Self-pay | Admitting: Obstetrics and Gynecology

## 2021-04-27 DIAGNOSIS — Z1231 Encounter for screening mammogram for malignant neoplasm of breast: Secondary | ICD-10-CM

## 2021-04-28 DIAGNOSIS — I83891 Varicose veins of right lower extremities with other complications: Secondary | ICD-10-CM | POA: Diagnosis not present

## 2021-04-29 ENCOUNTER — Ambulatory Visit
Admission: RE | Admit: 2021-04-29 | Discharge: 2021-04-29 | Disposition: A | Discharge status: Home / Self Care | Payer: Medicare HMO | Source: Ambulatory Visit | Attending: Obstetrics and Gynecology | Admitting: Obstetrics and Gynecology

## 2021-04-29 ENCOUNTER — Telehealth: Payer: Self-pay | Admitting: Family Medicine

## 2021-04-29 DIAGNOSIS — Z1231 Encounter for screening mammogram for malignant neoplasm of breast: Secondary | ICD-10-CM

## 2021-04-29 NOTE — Telephone Encounter (Signed)
Patient cal;ed to inform Dr Sarajane Jews tat the Prednisone  that she is currently taking Is working wonderful. She states she has her feeling back after 5 yrs. And walking around with her walker... she also wanted me to mention that she is doing the happy dance.  ?Patient wanted to ensure that Dr Sarajane Jews how wonderful her results are. ?

## 2021-04-29 NOTE — Telephone Encounter (Signed)
FYI

## 2021-05-01 DIAGNOSIS — Z09 Encounter for follow-up examination after completed treatment for conditions other than malignant neoplasm: Secondary | ICD-10-CM | POA: Diagnosis not present

## 2021-05-01 DIAGNOSIS — I87391 Chronic venous hypertension (idiopathic) with other complications of right lower extremity: Secondary | ICD-10-CM | POA: Diagnosis not present

## 2021-05-05 ENCOUNTER — Ambulatory Visit: Payer: Medicare HMO | Admitting: Cardiology

## 2021-05-05 ENCOUNTER — Encounter: Payer: Self-pay | Admitting: Cardiology

## 2021-05-05 VITALS — BP 137/68 | HR 56 | Resp 16 | Ht 67.0 in | Wt 220.0 lb

## 2021-05-05 DIAGNOSIS — E785 Hyperlipidemia, unspecified: Secondary | ICD-10-CM

## 2021-05-05 DIAGNOSIS — I455 Other specified heart block: Secondary | ICD-10-CM

## 2021-05-05 DIAGNOSIS — R001 Bradycardia, unspecified: Secondary | ICD-10-CM

## 2021-05-05 DIAGNOSIS — I495 Sick sinus syndrome: Secondary | ICD-10-CM | POA: Diagnosis not present

## 2021-05-05 DIAGNOSIS — I5031 Acute diastolic (congestive) heart failure: Secondary | ICD-10-CM | POA: Diagnosis not present

## 2021-05-05 MED ORDER — DAPAGLIFLOZIN PROPANEDIOL 10 MG PO TABS: 10.0000 mg | ORAL_TABLET | Freq: Every morning | ORAL | 0 refills | Status: AC

## 2021-05-05 MED ORDER — FUROSEMIDE 20 MG PO TABS: 20.0000 mg | ORAL_TABLET | Freq: Every day | ORAL | 0 refills | Status: DC

## 2021-05-05 NOTE — Progress Notes (Signed)
? ?ID:  Rachael [name], DOB 13-Oct-1933, MRN [id number] ? ?PCP:  [name], MD  ?Cardiologist:  Rex Kras, DO, [facility] (established care 04/09/2021) ?Former Cardiology Providers: Dr. Ali Lowe and Dr. Lovena Le ? ?Date: 05/05/21 ?Last Office Visit: 04/09/2021 ? ?Chief Complaint  ?Patient presents with  ? Shortness of Breath  ? Results  ? Follow-up  ? ? ?HPI  ?Rachael [name] is a 88 y.o. Caucasian female whose past medical history and cardiovascular risk factors include: Hypothyroidism, hyperlipidemia, diastolic dysfunction, sinus node dysfunction, advanced age, postmenopausal female. ? ?She is referred to the office at the request of [name], MD for evaluation of bradycardia/SVT. ? ?Since November/December 2022 she has had 2 episodes of palpitations followed by bradycardia and during these events that she is experience lightheaded/dizziness/near syncope.  She has been evaluated by cardiac electrophysiology Dr. Lovena Le as her Holter monitor was concerning for tachybradycardia syndrome.  It was felt that she may need a pacemaker in the future given his sinus node dysfunction. ? ?She establish care with our practice given her bradycardia/SVT and shortness of breath.  She recently was treated for upper respiratory tract infection after recent cruise.  At last office visit patient was noted to have rhonchi's and wheezes bilaterally and was requested to follow-up with pulmonary medicine along with obtaining a chest x-ray and labs to evaluate and NT proBNP.  She has been on Lasix for diuresis.  Her NT proBNP was elevated and now presents to the office for reevaluation of shortness of breath. ? ?This shared decision at last office visit was to proceed with ischemic work-up depending on her coronary calcium score.  Her coronary calcium score is still pending.  Patient states that her shortness of breath is getting progressively worse.  She followed up with her PCP since last office visit and was given Levaquin for  additional 14 days to treat for bronchitis and her cough is essentially resolved.  She continues to have bilateral lower extremity swelling, dyspnea on exertion, unable to comment on orthopnea or PND as she sleeps on her side. ? ? ?FUNCTIONAL STATUS: ?Ambulates with a walker.  No structured exercise program or daily routine.  But remains very active member of society. ? ?ALLERGIES: ?Allergies  ?Allergen Reactions  ? Prednisone Other (See Comments)  ?  Muscle weakness  ?Caused severe numbness and weakness of  extremity  ? Shellfish Allergy Anaphylaxis and Hives  ?  Hives all over the body. ?Hives all over the body  ? Percocet [Oxycodone-Acetaminophen] Hives  ? Iodinated Contrast Media Other (See Comments)  ?  PT IS NOT AWARE OF IODINE ALLERGY, PREMEDICATED FOR PRIOR PREMEDS ONLY//A.C. - unknown reaction  ? Iodine Hives  ? ? ?MEDICATION LIST PRIOR TO VISIT: ?Current Meds  ?Medication Sig  ? dapagliflozin propanediol (FARXIGA) 10 MG TABS tablet Take 1 tablet (10 mg total) by mouth every morning.  ?  ? ?PAST MEDICAL HISTORY: ?Past Medical History:  ?Diagnosis Date  ? Breast cancer ([place])   ? R  ? CAROTID STENOSIS   ? Chronic venous insufficiency   ? CONTACT DERMATITIS   ? DUPUYTREN'S CONTRACTURE   ? Edema   ? pt  states had edema of feet and legs has been on lasix per dr. to help  ? GLUCOSE INTOLERANCE   ? HLD (hyperlipidemia)   ? Hypothyroidism   ? Dr. Ala Dach in [place]   ? MENOPAUSAL SYNDROME   ? Obesity   ? Osteoarthritis   ? bilat hips, severe  ?  PVD (peripheral vascular disease) ([place])   ? mild carotid 11/05  ? VITAMIN D DEFICIENCY   ? ? ?PAST SURGICAL HISTORY: ?Past Surgical History:  ?Procedure Laterality Date  ? 2D echo  11/29/2003  ? normal LV size. normal EF   ? BACK SURGERY  12/06/2016  ? L3-L5 laminectomies per Dr. Maryellen Pile at [facility]  ? BREAST BIOPSY  05/11/2011  ? right breast  ? BREAST LUMPECTOMY    ? right breast  ? CHOLECYSTECTOMY    ? FINGER SURGERY    ? HERNIA REPAIR    ?  incisional hernia  ? INGUINAL HERNIA REPAIR    ? JOINT REPLACEMENT Bilateral   ? Dr. Maureen Ralphs  ? normal caronary arteries    ? per pt. 2003  ? right rotator cuff surgery    ? SPINE SURGERY    ? TONSILLECTOMY    ? TOTAL HIP ARTHROPLASTY  05/2008 and 10/2008  ? B hip - alusio  ? ? ?FAMILY HISTORY: ?The patient family history includes Breast cancer (age of onset: 62) in her daughter; Cancer in her daughter and mother; Clotting disorder in her maternal grandmother; Colon cancer in her cousin; Coronary artery disease in an other family member; Diabetes in her son; Heart disease in her brother and father; Hypothyroidism in an other family member; Leukemia in her cousin; Ovarian cancer in her mother; Pancreatic cancer in her maternal grandfather; Prostate cancer in her cousin, maternal uncle, and paternal uncle; Uterine cancer in her cousin; Uterine cancer (age of onset: 44) in her mother; Varicose Veins in her father. ? ?SOCIAL HISTORY:  ?The patient  reports that she has never smoked. She has been exposed to tobacco smoke. She has never used smokeless tobacco. She reports that she does not drink alcohol and does not use drugs. ? ?REVIEW OF SYSTEMS: ?Review of Systems  ?Cardiovascular:  Positive for dyspnea on exertion, leg swelling and palpitations. Negative for chest pain, orthopnea and syncope.  ?Respiratory:  Positive for shortness of breath.   ? ?PHYSICAL EXAM: ? ?  05/05/2021  ?  9:54 AM 04/22/2021  ?  1:38 PM 04/16/2021  ? 10:15 AM  ?Vitals with BMI  ?Height '5\' 7"'$   '5\' 7"'$   ?Weight 220 lbs    ?BMI 34.45    ?Systolic 967 893 810  ?Diastolic 68 62 80  ?Pulse 56 71 64  ? ?CONSTITUTIONAL: Age-appropriate female, well-developed and well-nourished. No acute distress.  Ambulates with a walker. ?SKIN: Skin is warm and dry. No rash noted. No cyanosis. No pallor. No jaundice ?HEAD: Normocephalic and atraumatic.  ?EYES: No scleral icterus ?MOUTH/THROAT: Moist oral membranes.  ?NECK: No JVD present. No thyromegaly noted. No carotid  bruits  ?CHEST Normal respiratory effort. No intercostal retractions  ?LUNGS: Clear to auscultation bilaterally.  Equal rise and fall of chest cavity.   ?CARDIOVASCULAR: Regular rate and rhythm, positive S1-S2, no murmurs rubs or gallops appreciated. ?ABDOMINAL: obese, soft, nontender, nondistended, positive bowel sounds in all 4 quadrants, no apparent ascites.  ?EXTREMITIES: +1 pitting edema bilaterally, tender to touch, warm, dry skin.  ?HEMATOLOGIC: No significant bruising ?NEUROLOGIC: Oriented to person, place, and time. Nonfocal. Normal muscle tone.  ?PSYCHIATRIC: Normal mood and affect. Normal behavior. Cooperative ? ?CARDIAC DATABASE: ?EKG: ?04/09/2021: Normal sinus rhythm, 66 bpm, normal axis, without underlying ischemia or injury pattern. ?May 05, 2021: Sinus bradycardia 57 bpm, without underlying ischemia or injury pattern. ? ?Echocardiogram: ?11/24/2020: ?LVEF 60-65%, no regional wall motion abnormalities, mild LVH, grade 1 diastolic dysfunction, moderately dilated  left atrium, trivial MR, trivial AR, aortic root at 39 mm, proximal ascending aorta 40 mm, estimated RAP 3 mmHg, small PFO with left-to-right shunting. ?  ?Stress Testing: ?No results found for this or any previous visit from the past 1095 days. ? ? ?Heart Catheterization: ?None ? ?Cardiac monitor: ?November 2022 ?Patch Wear Time:  3 days and 23 hours  ?  ?Patient had a min HR of 38 bpm, max HR of 171 bpm, and avg HR of 57 bpm.  ?  ?Predominant underlying rhythm was Sinus Rhythm.  ?  ?EVENTS: ?43 Supraventricular Tachycardia runs occurred, the run with the fastest interval lasting 7 beats with a max rate of 171 bpm, the longest lasting 1 min 20 secs with an avg rate of 139 bpm.  ?  ?3 Pauses occurred, the longest lasting 3.1 secs (19 bpm).  ?3 pauses were noted during the monitoring period. ?11/19/2020 at 6:28 AM 3.1-second pause. ?11/18/2020 at 4:36 PM 3.1-second pause. ?11/18/2020 at 3:14 PM 3-second pause.   ?Sinus bradycardia burden 38%.   ?   ?Some episodes of Supraventricular Tachycardia may be possible Atrial Tachycardia with variable block. Isolated SVEs were rare (<1.0%), SVE Couplets were rare (<1.0%), and SVE Triplets were rare (<1.0%). Isolated VEs w

## 2021-05-07 ENCOUNTER — Encounter: Payer: Self-pay | Admitting: Family Medicine

## 2021-05-07 ENCOUNTER — Ambulatory Visit (INDEPENDENT_AMBULATORY_CARE_PROVIDER_SITE_OTHER): Payer: Medicare HMO

## 2021-05-07 ENCOUNTER — Ambulatory Visit (INDEPENDENT_AMBULATORY_CARE_PROVIDER_SITE_OTHER): Payer: Medicare HMO | Admitting: Family Medicine

## 2021-05-07 VITALS — BP 110/60 | HR 62 | Temp 98.2°F | Wt 221.0 lb

## 2021-05-07 DIAGNOSIS — M25562 Pain in left knee: Secondary | ICD-10-CM

## 2021-05-07 DIAGNOSIS — M11262 Other chondrocalcinosis, left knee: Secondary | ICD-10-CM | POA: Diagnosis not present

## 2021-05-07 NOTE — Progress Notes (Signed)
? ?  Subjective:  ? ? Patient ID: Rachael [name], female    DOB: Nov 08, 1933, 88 y.o.   MRN: [id number] ? ?HPI ?Here for tenderness in the anterior left knee. About 4 weeks ago she tripped and fell forward, landing ln her knees. The anterior left knee was painful at first and had significant bruising. No real swelling. She has not seen anyone to examine it until today. Over the past few weeks the pain has resolved and she can walk on the leg with no pain at all. The bruising has almost resolved. However the front of the knee is still very tender to touch.  ? ? ?Review of Systems  ?Constitutional: Negative.   ?Respiratory: Negative.    ?Cardiovascular: Negative.   ?Musculoskeletal:  Positive for arthralgias.  ? ?   ?Objective:  ? Physical Exam ?Constitutional:   ?   General: She is not in acute distress. ?   Comments: She walks with her walker easily and without pain   ?Cardiovascular:  ?   Rate and Rhythm: Normal rate and regular rhythm.  ?   Pulses: Normal pulses.  ?   Heart sounds: Normal heart sounds.  ?Pulmonary:  ?   Effort: Pulmonary effort is normal.  ?   Breath sounds: Normal breath sounds.  ?Musculoskeletal:  ?   Comments: The anterior left knee has some traces of ecchymoses. There is no swelling or warmth. She is tender over the inferior patella and the tibial tubercle. ROM is full with no crepitus   ?Neurological:  ?   Mental Status: She is alert.  ? ? ? ? ? ?   ?Assessment & Plan:  ?Left knee  contusion. I think this is slowly healing up, but we will get Xrays today to rule out fractures, etc.  ?Alysia Penna, MD ? ? ?

## 2021-05-08 ENCOUNTER — Ambulatory Visit: Payer: Medicare HMO | Admitting: Cardiology

## 2021-05-12 ENCOUNTER — Other Ambulatory Visit: Payer: Self-pay | Admitting: Cardiology

## 2021-05-12 DIAGNOSIS — E559 Vitamin D deficiency, unspecified: Secondary | ICD-10-CM | POA: Diagnosis not present

## 2021-05-12 DIAGNOSIS — I5031 Acute diastolic (congestive) heart failure: Secondary | ICD-10-CM | POA: Diagnosis not present

## 2021-05-13 ENCOUNTER — Ambulatory Visit: Payer: Medicare HMO

## 2021-05-13 DIAGNOSIS — I5031 Acute diastolic (congestive) heart failure: Secondary | ICD-10-CM | POA: Diagnosis not present

## 2021-05-13 LAB — BASIC METABOLIC PANEL
BUN/Creatinine Ratio: 19 (ref 12–28)
BUN: 18 mg/dL (ref 8–27)
CO2: 26 mmol/L (ref 20–29)
Calcium: 9.9 mg/dL (ref 8.7–10.3)
Chloride: 102 mmol/L (ref 96–106)
Creatinine, Ser: 0.93 mg/dL (ref 0.57–1.00)
Glucose: 101 mg/dL — ABNORMAL HIGH (ref 70–99)
Potassium: 4.4 mmol/L (ref 3.5–5.2)
Sodium: 142 mmol/L (ref 134–144)
eGFR: 60 mL/min/{1.73_m2} (ref >59)

## 2021-05-13 LAB — PRO B NATRIURETIC PEPTIDE: NT-Pro BNP: 718 pg/mL (ref 0–738)

## 2021-05-13 LAB — MAGNESIUM: Magnesium: 2 mg/dL (ref 1.6–2.3)

## 2021-05-15 ENCOUNTER — Ambulatory Visit: Payer: Medicare HMO | Admitting: Family Medicine

## 2021-05-15 ENCOUNTER — Telehealth: Payer: Self-pay

## 2021-05-15 NOTE — Telephone Encounter (Signed)
Informed pt that Per Dr. Terri Skains to take 1/2 pill of lasix a total of '30mg'$  of lasix. Informed pt to take a total of '30mg'$  of lasix until she gets to her normal weight to to start bananas. Pt understood ?

## 2021-05-15 NOTE — Telephone Encounter (Signed)
Pt called to inform us that she gain 1.7lb since yesterday. PT is having SOB, left foot swelling. Pt denise cp. Pt bp is 140/62 pulse 55. Please advise ?

## 2021-05-18 ENCOUNTER — Other Ambulatory Visit: Payer: Medicare HMO

## 2021-05-19 ENCOUNTER — Ambulatory Visit (INDEPENDENT_AMBULATORY_CARE_PROVIDER_SITE_OTHER): Payer: Medicare HMO | Admitting: Family Medicine

## 2021-05-19 ENCOUNTER — Telehealth: Payer: Self-pay

## 2021-05-19 ENCOUNTER — Ambulatory Visit: Payer: Medicare HMO | Admitting: Family Medicine

## 2021-05-19 ENCOUNTER — Encounter: Payer: Self-pay | Admitting: Family Medicine

## 2021-05-19 ENCOUNTER — Ambulatory Visit (INDEPENDENT_AMBULATORY_CARE_PROVIDER_SITE_OTHER): Payer: Medicare HMO

## 2021-05-19 VITALS — BP 110/70 | Ht 67.0 in | Wt 219.0 lb

## 2021-05-19 VITALS — BP 110/70 | HR 73 | Temp 99.0°F | Wt 219.0 lb

## 2021-05-19 DIAGNOSIS — J449 Chronic obstructive pulmonary disease, unspecified: Secondary | ICD-10-CM

## 2021-05-19 DIAGNOSIS — J4489 Other specified chronic obstructive pulmonary disease: Secondary | ICD-10-CM | POA: Insufficient documentation

## 2021-05-19 DIAGNOSIS — Z0000 Encounter for general adult medical examination without abnormal findings: Secondary | ICD-10-CM | POA: Diagnosis not present

## 2021-05-19 DIAGNOSIS — R0609 Other forms of dyspnea: Secondary | ICD-10-CM | POA: Diagnosis not present

## 2021-05-19 NOTE — Patient Instructions (Signed)
[name] , ?Thank you for taking time to come for your Medicare Wellness Visit. I appreciate your ongoing commitment to your health goals. Please review the following plan we discussed and let me know if I can assist you in the future.  ? ?Screening recommendations/referrals: ?Colonoscopy: not required ?Mammogram: completed 04/29/2021 ?Bone Density: completed 09/09/2016 ?Recommended yearly ophthalmology/optometry visit for glaucoma screening and checkup ?Recommended yearly dental visit for hygiene and checkup ? ?Vaccinations: ?Influenza vaccine: decline ?Pneumococcal vaccine: decline ?Tdap vaccine: completed 02/22/2014, due 02/23/2024 ?Shingles vaccine: completed   ?Covid-19: decline ? ?Advanced directives: Please bring a copy of your POA (Power of Attorney) and/or Living Will to your next appointment.  ? ?Conditions/risks identified: none ? ?Next appointment: Follow up in one year for your annual wellness visit  ? ? ?Preventive Care 5 Years and Older, Female ?Preventive care refers to lifestyle choices and visits with your health care provider that can promote health and wellness. ?What does preventive care include? ?A yearly physical exam. This is also called an annual well check. ?Dental exams once or twice a year. ?Routine eye exams. Ask your health care provider how often you should have your eyes checked. ?Personal lifestyle choices, including: ?Daily care of your teeth and gums. ?Regular physical activity. ?Eating a healthy diet. ?Avoiding tobacco and drug use. ?Limiting alcohol use. ?Practicing safe sex. ?Taking low-dose aspirin every day. ?Taking vitamin and mineral supplements as recommended by your health care provider. ?What happens during an annual well check? ?The services and screenings done by your health care provider during your annual well check will depend on your age, overall health, lifestyle risk factors, and family history of disease. ?Counseling  ?Your health care provider may ask you  questions about your: ?Alcohol use. ?Tobacco use. ?Drug use. ?Emotional well-being. ?Home and relationship well-being. ?Sexual activity. ?Eating habits. ?History of falls. ?Memory and ability to understand (cognition). ?Work and work Statistician. ?Reproductive health. ?Screening  ?You may have the following tests or measurements: ?Height, weight, and BMI. ?Blood pressure. ?Lipid and cholesterol levels. These may be checked every 5 years, or more frequently if you are over 44 years old. ?Skin check. ?Lung cancer screening. You may have this screening every year starting at age 74 if you have a 30-pack-year history of smoking and currently smoke or have quit within the past 15 years. ?Fecal occult blood test (FOBT) of the stool. You may have this test every year starting at age 23. ?Flexible sigmoidoscopy or colonoscopy. You may have a sigmoidoscopy every 5 years or a colonoscopy every 10 years starting at age 85. ?Hepatitis C blood test. ?Hepatitis B blood test. ?Sexually transmitted disease (STD) testing. ?Diabetes screening. This is done by checking your blood sugar (glucose) after you have not eaten for a while (fasting). You may have this done every 1-3 years. ?Bone density scan. This is done to screen for osteoporosis. You may have this done starting at age 48. ?Mammogram. This may be done every 1-2 years. Talk to your health care provider about how often you should have regular mammograms. ?Talk with your health care provider about your test results, treatment options, and if necessary, the need for more tests. ?Vaccines  ?Your health care provider may recommend certain vaccines, such as: ?Influenza vaccine. This is recommended every year. ?Tetanus, diphtheria, and acellular pertussis (Tdap, Td) vaccine. You may need a Td booster every 10 years. ?Zoster vaccine. You may need this after age 28. ?Pneumococcal 13-valent conjugate (PCV13) vaccine. One dose is recommended after age  65. ?Pneumococcal polysaccharide  (PPSV23) vaccine. One dose is recommended after age 72. ?Talk to your health care provider about which screenings and vaccines you need and how often you need them. ?This information is not intended to replace advice given to you by your health care provider. Make sure you discuss any questions you have with your health care provider. ?Document Released: 01/24/2015 Document Revised: 09/17/2015 Document Reviewed: 10/29/2014 ?Elsevier Interactive Patient Education ? 2017 [place]. ? ?Fall Prevention in the Home ?Falls can cause injuries. They can happen to people of all ages. There are many things you can do to make your home safe and to help prevent falls. ?What can I do on the outside of my home? ?Regularly fix the edges of walkways and driveways and fix any cracks. ?Remove anything that might make you trip as you walk through a door, such as a raised step or threshold. ?Trim any bushes or trees on the path to your home. ?Use bright outdoor lighting. ?Clear any walking paths of anything that might make someone trip, such as rocks or tools. ?Regularly check to see if handrails are loose or broken. Make sure that both sides of any steps have handrails. ?Any raised decks and porches should have guardrails on the edges. ?Have any leaves, snow, or ice cleared regularly. ?Use sand or salt on walking paths during winter. ?Clean up any spills in your garage right away. This includes oil or grease spills. ?What can I do in the bathroom? ?Use night lights. ?Install grab bars by the toilet and in the tub and shower. Do not use towel bars as grab bars. ?Use non-skid mats or decals in the tub or shower. ?If you need to sit down in the shower, use a plastic, non-slip stool. ?Keep the floor dry. Clean up any water that spills on the floor as soon as it happens. ?Remove soap buildup in the tub or shower regularly. ?Attach bath mats securely with double-sided non-slip rug tape. ?Do not have throw rugs and other things on the  floor that can make you trip. ?What can I do in the bedroom? ?Use night lights. ?Make sure that you have a light by your bed that is easy to reach. ?Do not use any sheets or blankets that are too big for your bed. They should not hang down onto the floor. ?Have a firm chair that has side arms. You can use this for support while you get dressed. ?Do not have throw rugs and other things on the floor that can make you trip. ?What can I do in the kitchen? ?Clean up any spills right away. ?Avoid walking on wet floors. ?Keep items that you use a lot in easy-to-reach places. ?If you need to reach something above you, use a strong step stool that has a grab bar. ?Keep electrical cords out of the way. ?Do not use floor polish or wax that makes floors slippery. If you must use wax, use non-skid floor wax. ?Do not have throw rugs and other things on the floor that can make you trip. ?What can I do with my stairs? ?Do not leave any items on the stairs. ?Make sure that there are handrails on both sides of the stairs and use them. Fix handrails that are broken or loose. Make sure that handrails are as long as the stairways. ?Check any carpeting to make sure that it is firmly attached to the stairs. Fix any carpet that is loose or worn. ?Avoid having throw rugs at  the top or bottom of the stairs. If you do have throw rugs, attach them to the floor with carpet tape. ?Make sure that you have a light switch at the top of the stairs and the bottom of the stairs. If you do not have them, ask someone to add them for you. ?What else can I do to help prevent falls? ?Wear shoes that: ?Do not have high heels. ?Have rubber bottoms. ?Are comfortable and fit you well. ?Are closed at the toe. Do not wear sandals. ?If you use a stepladder: ?Make sure that it is fully opened. Do not climb a closed stepladder. ?Make sure that both sides of the stepladder are locked into place. ?Ask someone to hold it for you, if possible. ?Clearly mark and make  sure that you can see: ?Any grab bars or handrails. ?First and last steps. ?Where the edge of each step is. ?Use tools that help you move around (mobility aids) if they are needed. These include: ?Canes. ?Walkers.

## 2021-05-19 NOTE — Progress Notes (Signed)
?I connected with Rachael [name] today by telephone and verified that I am speaking with the correct person using two identifiers. ?Location patient: home ?Location provider: work ?Persons participating in the virtual visit: Zehava, Turski LPN. ?  ?I discussed the limitations, risks, security and privacy concerns of performing an evaluation and management service by telephone and the availability of in person appointments. I also discussed with the patient that there may be a patient responsible charge related to this service. The patient expressed understanding and verbally consented to this telephonic visit.  ?  ?Interactive audio and video telecommunications were attempted between this provider and patient, however failed, due to patient having technical difficulties OR patient did not have access to video capability.  We continued and completed visit with audio only. ? ?  ? ?Vital signs may be patient reported or missing. ? ?Subjective:  ? Rachael [name] is a 88 y.o. female who presents for Medicare Annual (Subsequent) preventive examination. ? ?Review of Systems    ? ?Cardiac Risk Factors include: advanced age (>51mn, >>64women);dyslipidemia ? ?   ?Objective:  ?  ?Today's Vitals  ? 05/19/21 1632  ?BP: 110/70  ?Weight: 219 lb (99.3 kg)  ?Height: 5' 7"  (1.702 m)  ? ?Body mass index is 34.3 kg/m?. ? ? ?  05/19/2021  ?  3:51 PM 05/13/2020  ?  3:24 PM 12/27/2018  ?  1:56 PM 04/13/2017  ?  3:39 PM 08/27/2016  ? 11:13 AM 04/21/2016  ?  2:51 PM 02/06/2016  ? 10:14 AM  ?Advanced Directives  ?Does Patient Have a Medical Advance Directive? Yes Yes Yes Yes Yes Yes Yes  ?Type of AParamedicof AMad RiverLiving will HFlat Top MountainLiving will HBig [place]Living will  [place]Living will [place]osh[place]Living will [place]ad[place]Living will  ?Does patient want to make changes to medical advance directive?   No - Patient  declined    No - Patient declined  ?Copy of HCouncil Bluffsin Chart? No - copy requested No - copy requested No - copy requested   Yes Yes  ? ? ?Current Medications (verified) ?Outpatient Encounter Medications as of 05/19/2021  ?Medication Sig  ? aspirin 81 MG EC tablet Take one every other day  ? atorvastatin (LIPITOR) 80 MG tablet Take 1 tablet (80 mg total) by mouth daily.  ? Cholecalciferol (VITAMIN D3 PO) Take 250 mg by mouth 4 (four) times daily.  ? Cyanocobalamin (B-12 PO) Take 1 tablet by mouth daily.   ? dapagliflozin propanediol (FARXIGA) 10 MG TABS tablet Take 1 tablet (10 mg total) by mouth every morning.  ? fluticasone (FLONASE) 50 MCG/ACT nasal spray Place 1 spray into both nostrils 2 (two) times daily.  ? furosemide (LASIX) 20 MG tablet Take 1 tablet (20 mg total) by mouth daily. Take one tablet twice a day EVERY OTHER DAY (Patient taking differently: Take 20 mg by mouth daily. Take one tablet twice a day EVERY OTHER DAY)  ? levothyroxine (SYNTHROID) 112 MCG tablet Take 1 tablet (112 mcg total) by mouth daily before breakfast.  ? Nutritional Supplements (JUICE PLUS FIBRE PO) Juice Plus  ? ?No facility-administered encounter medications on file as of 05/19/2021.  ? ? ?Allergies (verified) ?Prednisone, Shellfish allergy, Percocet [oxycodone-acetaminophen], Iodinated contrast media, and Iodine  ? ?History: ?Past Medical History:  ?Diagnosis Date  ? Breast cancer ([place]urin   ? R  ? CAROTID STENOSIS   ? Chronic venous  insufficiency   ? CONTACT DERMATITIS   ? DUPUYTREN'S CONTRACTURE   ? Edema   ? pt  states had edema of feet and legs has been on lasix per dr. to help  ? GLUCOSE INTOLERANCE   ? HLD (hyperlipidemia)   ? Hypothyroidism   ? Dr. Ala Dach in [place]   ? MENOPAUSAL SYNDROME   ? Obesity   ? Osteoarthritis   ? bilat hips, severe  ? PVD (peripheral vascular disease) ([place])   ? mild carotid 11/05  ? VITAMIN D DEFICIENCY   ? ?Past Surgical History:  ?Procedure Laterality Date  ? 2D echo   11/29/2003  ? normal LV size. normal EF   ? BACK SURGERY  12/06/2016  ? L3-L5 laminectomies per Dr. Maryellen Pile at [facility]  ? BREAST BIOPSY  05/11/2011  ? right breast  ? BREAST LUMPECTOMY    ? right breast  ? CHOLECYSTECTOMY    ? FINGER SURGERY    ? HERNIA REPAIR    ? incisional hernia  ? INGUINAL HERNIA REPAIR    ? JOINT REPLACEMENT Bilateral   ? Dr. Maureen Ralphs  ? normal caronary arteries    ? per pt. 2003  ? right rotator cuff surgery    ? SPINE SURGERY    ? TONSILLECTOMY    ? TOTAL HIP ARTHROPLASTY  05/2008 and 10/2008  ? B hip - alusio  ? ?Family History  ?Problem Relation Age of Onset  ? Uterine cancer Mother 102  ? Cancer Mother   ? Ovarian cancer Mother   ? Heart disease Father   ?     before age 89  ? Varicose Veins Father   ? Heart disease Brother   ?     before age 51  ? Prostate cancer Maternal Uncle   ?     diagnosed in his 8s  ? Prostate cancer Paternal Uncle   ?     diagnosed in his 68s  ? Clotting disorder Maternal Grandmother   ? Pancreatic cancer Maternal Grandfather   ?     diagnosed in his 19s  ? Breast cancer Daughter 19  ?     BRCA negative (no BART)  ? Cancer Daughter   ? Diabetes Son   ? Colon cancer Cousin   ? Leukemia Cousin   ? Uterine cancer Cousin   ? Prostate cancer Cousin   ? Coronary artery disease Other   ?     family hx - female 1st degree relative <60  ? Hypothyroidism Other   ?     aunt   ? Anesthesia problems Neg Hx   ? Adrenal disorder Neg Hx   ? Esophageal cancer Neg Hx   ? Stomach cancer Neg Hx   ? ?Social History  ? ?Socioeconomic History  ? Marital status: Divorced  ?  Spouse name: Not on file  ? Number of children: 5  ? Years of education: 1 year college  ? Highest education level: High school graduate  ?Occupational History  ? Not on file  ?Tobacco Use  ? Smoking status: Never  ?  Passive exposure: Past  ? Smokeless tobacco: Never  ?Vaping Use  ? Vaping Use: Never used  ?Substance and Sexual Activity  ? Alcohol use: No  ?  Alcohol/week: 0.0 standard drinks  ? Drug  use: No  ? Sexual activity: Not Currently  ?Other Topics Concern  ? Not on file  ?Social History Narrative  ? Divorced; works part time  ?  5 children  ? ?Social Determinants of Health  ? ?Financial Resource Strain: Low Risk   ? Difficulty of Paying Living Expenses: Not hard at all  ?Food Insecurity: No Food Insecurity  ? Worried About Charity fundraiser in the Last Year: Never true  ? Ran Out of Food in the Last Year: Never true  ?Transportation Needs: No Transportation Needs  ? Lack of Transportation (Medical): No  ? Lack of Transportation (Non-Medical): No  ?Physical Activity: Sufficiently Active  ? Days of Exercise per Week: 7 days  ? Minutes of Exercise per Session: 30 min  ?Stress: No Stress Concern Present  ? Feeling of Stress : Not at all  ?Social Connections: Not on file  ? ? ?Tobacco Counseling ?Counseling given: Not Answered ? ? ?Clinical Intake: ? ?Pre-visit preparation completed: Yes ? ?Pain : No/denies pain ? ?  ? ?Nutritional Status: BMI > 30  Obese ?Nutritional Risks: None ?Diabetes: No ? ?How often do you need to have someone help you when you read instructions, pamphlets, or other written materials from your doctor or pharmacy?: 1 - Never ?What is the last grade level you completed in school?: 50yrcollege ? ?Diabetic?no ? ?Interpreter Needed?: No ? ?Information entered by :: NAllen LPN ? ? ?Activities of Daily Living ? ?  05/19/2021  ?  3:52 PM 10/27/2020  ? 11:03 AM  ?In your present state of health, do you have any difficulty performing the following activities:  ?Hearing? 0 0  ?Vision? 0 0  ?Difficulty concentrating or making decisions? 0 0  ?Walking or climbing stairs? 0 0  ?Comment  USES A CANE  ?Dressing or bathing? 0 0  ?Doing errands, shopping? 0 0  ?Preparing Food and eating ? N   ?Using the Toilet? N   ?In the past six months, have you accidently leaked urine? Y   ?Do you have problems with loss of bowel control? N   ?Managing your Medications? N   ?Managing your Finances? N   ?Housekeeping  or managing your Housekeeping? N   ? ? ?Patient Care Team: ?FLaurey Morale MD as PCP - General (Family Medicine) ?AGaynelle Arabian MD as Consulting Physician (Orthopedic Surgery) ?GArmandina Gemma MD as CErin Fulling

## 2021-05-19 NOTE — Telephone Encounter (Signed)
Pt called to inform us that she has with SOB since yesterday and this morning her breathing got worse. Pt would like to know what should he do ?

## 2021-05-19 NOTE — Telephone Encounter (Signed)
Her NT-proBNP has improved and recent stress test did not illustrate reversible ischemia.  ?Please have her follow up w/ PCP for other non-cardiac causes of shortness of breath otherwise please have her go to ER for an expedited evaluation.  ? ?Dr. Terri Skains.

## 2021-05-19 NOTE — Telephone Encounter (Signed)
Pt called asking if she still needs to take farxiga '10mg'$  due to her stress being normal. She does not want to take medication if she does not have any heart problems due to stress test being ok. Please advise ?

## 2021-05-19 NOTE — Telephone Encounter (Signed)
Pt was informed and understood.

## 2021-05-19 NOTE — Progress Notes (Signed)
? ?  Subjective:  ? ? Patient ID: Rachael [name], female    DOB: 1933-09-02, 88 y.o.   MRN: [id number] ? ?HPI ?Here to follow up on SOB after her cardiac workup with Dr. Rex Kras. She recently had a myocardial perfusion scan which showed clean coronary arteries. This also showed an EF of 44% and global hypokinesis of the LV. This was in contrast to the ECHO which showed normal LV function and an EF of 60-65%. Also her 7 day monitor showed mostly sinus rhythm with 3 pauses (the longest being 3.1 seconds) and 43 episodes of SVT (with average rates of 153). Dr. Terri Skains summed all this up by saying that her heart is functioning perfectly well. Therefore her SOB on exertion was due to "something else" and he sent her back to see [place]. Of note a CXR she had on 04-09-21 showed emphysematous changes and chronic bronchitis. We was referred to see Dr. Baird Lyons of Pulmonary, and he ordered a CTA of the chest, but this has not been set up yet. Rachael [name] says she did not feel comfortable with this referral, and she asks if she can see a pulmonologist in the [facility] system. She has never smoked but she was directly exposed to a lot of second hand smoke as a child.  ? ? ?Review of Systems  ?Constitutional: Negative.   ?Respiratory:  Positive for shortness of breath. Negative for cough, choking and wheezing.   ?Cardiovascular: Negative.   ?Gastrointestinal: Negative.   ?Genitourinary: Negative.   ? ?   ?Objective:  ? Physical Exam ?Constitutional:   ?   General: She is not in acute distress. ?   Appearance: Normal appearance.  ?   Comments: Walks with a walker   ?Cardiovascular:  ?   Rate and Rhythm: Normal rate and regular rhythm.  ?   Pulses: Normal pulses.  ?   Heart sounds: Normal heart sounds.  ?Pulmonary:  ?   Effort: Pulmonary effort is normal.  ?   Breath sounds: Normal breath sounds.  ?Musculoskeletal:  ?   Comments: Trace edema in both ankles   ?Neurological:  ?   Mental Status: She is alert.  ? ? ? ? ? ?   ?Assessment & Plan:   ?SOB on exertion apparently due to emphysema and chronic bronchitis. Since her heart status has been cleared, she can stop taking [place]. We will refer her to [facility]. We spent a total of ( 34  ) minutes reviewing records and discussing these issues.  ?Alysia Penna, MD ? ? ?

## 2021-05-19 NOTE — Telephone Encounter (Signed)
Yes, please have her continue the current meds it will help her breathing.  ?Labs from May 2nd illustrate stable renal function and fluid markers have improved when compared to prior.  ?I will see her on 05/28/2021. ? ?Dr. Terri Skains

## 2021-05-20 ENCOUNTER — Encounter (HOSPITAL_COMMUNITY): Payer: Self-pay

## 2021-05-20 ENCOUNTER — Emergency Department (HOSPITAL_COMMUNITY): Payer: Medicare HMO

## 2021-05-20 ENCOUNTER — Emergency Department (HOSPITAL_COMMUNITY)
Admission: EM | Admit: 2021-05-20 | Discharge: 2021-05-20 | Disposition: A | Discharge status: Home / Self Care | Payer: Medicare HMO | Attending: Emergency Medicine | Admitting: Emergency Medicine

## 2021-05-20 DIAGNOSIS — R778 Other specified abnormalities of plasma proteins: Secondary | ICD-10-CM | POA: Insufficient documentation

## 2021-05-20 DIAGNOSIS — L03115 Cellulitis of right lower limb: Secondary | ICD-10-CM | POA: Insufficient documentation

## 2021-05-20 DIAGNOSIS — I7789 Other specified disorders of arteries and arterioles: Secondary | ICD-10-CM | POA: Diagnosis not present

## 2021-05-20 DIAGNOSIS — R0600 Dyspnea, unspecified: Secondary | ICD-10-CM | POA: Diagnosis not present

## 2021-05-20 DIAGNOSIS — R7989 Other specified abnormal findings of blood chemistry: Secondary | ICD-10-CM | POA: Diagnosis not present

## 2021-05-20 DIAGNOSIS — E039 Hypothyroidism, unspecified: Secondary | ICD-10-CM | POA: Diagnosis not present

## 2021-05-20 DIAGNOSIS — Z20822 Contact with and (suspected) exposure to covid-19: Secondary | ICD-10-CM | POA: Insufficient documentation

## 2021-05-20 DIAGNOSIS — R0602 Shortness of breath: Secondary | ICD-10-CM | POA: Diagnosis not present

## 2021-05-20 DIAGNOSIS — Z7982 Long term (current) use of aspirin: Secondary | ICD-10-CM | POA: Insufficient documentation

## 2021-05-20 DIAGNOSIS — J449 Chronic obstructive pulmonary disease, unspecified: Secondary | ICD-10-CM | POA: Diagnosis not present

## 2021-05-20 DIAGNOSIS — J8410 Pulmonary fibrosis, unspecified: Secondary | ICD-10-CM | POA: Diagnosis not present

## 2021-05-20 DIAGNOSIS — Z7951 Long term (current) use of inhaled steroids: Secondary | ICD-10-CM | POA: Diagnosis not present

## 2021-05-20 DIAGNOSIS — I2510 Atherosclerotic heart disease of native coronary artery without angina pectoris: Secondary | ICD-10-CM | POA: Diagnosis not present

## 2021-05-20 LAB — CBC WITH DIFFERENTIAL/PLATELET
Abs Immature Granulocytes: 0.04 10*3/uL (ref 0.00–0.07)
Basophils Absolute: 0 10*3/uL (ref 0.0–0.1)
Basophils Relative: 0 %
Eosinophils Absolute: 0.1 10*3/uL (ref 0.0–0.5)
Eosinophils Relative: 1 %
HCT: 40.8 % (ref 36.0–46.0)
Hemoglobin: 14 g/dL (ref 12.0–15.0)
Immature Granulocytes: 0 %
Lymphocytes Relative: 18 %
Lymphs Abs: 2 10*3/uL (ref 0.7–4.0)
MCH: 30.4 pg (ref 26.0–34.0)
MCHC: 34.3 g/dL (ref 30.0–36.0)
MCV: 88.5 fL (ref 80.0–100.0)
Monocytes Absolute: 1.8 10*3/uL — ABNORMAL HIGH (ref 0.1–1.0)
Monocytes Relative: 16 %
Neutro Abs: 7.5 10*3/uL (ref 1.7–7.7)
Neutrophils Relative %: 65 %
Platelets: 334 10*3/uL (ref 150–400)
RBC: 4.61 MIL/uL (ref 3.87–5.11)
RDW: 14.5 % (ref 11.5–15.5)
WBC: 11.5 10*3/uL — ABNORMAL HIGH (ref 4.0–10.5)
nRBC: 0 % (ref 0.0–0.2)

## 2021-05-20 LAB — COMPREHENSIVE METABOLIC PANEL
ALT: 18 U/L (ref 0–44)
AST: 25 U/L (ref 15–41)
Albumin: 4 g/dL (ref 3.5–5.0)
Alkaline Phosphatase: 74 U/L (ref 38–126)
Anion gap: 11 (ref 5–15)
BUN: 23 mg/dL (ref 8–23)
CO2: 26 mmol/L (ref 22–32)
Calcium: 10 mg/dL (ref 8.9–10.3)
Chloride: 100 mmol/L (ref 98–111)
Creatinine, Ser: 1.01 mg/dL — ABNORMAL HIGH (ref 0.44–1.00)
GFR, Estimated: 55 mL/min — ABNORMAL LOW (ref >60)
Glucose, Bld: 115 mg/dL — ABNORMAL HIGH (ref 70–99)
Potassium: 3.8 mmol/L (ref 3.5–5.1)
Sodium: 137 mmol/L (ref 135–145)
Total Bilirubin: 2.4 mg/dL — ABNORMAL HIGH (ref 0.3–1.2)
Total Protein: 7.5 g/dL (ref 6.5–8.1)

## 2021-05-20 LAB — TROPONIN I (HIGH SENSITIVITY)
Troponin I (High Sensitivity): 29 ng/L — ABNORMAL HIGH (ref <18)
Troponin I (High Sensitivity): 27 ng/L — ABNORMAL HIGH (ref <18)

## 2021-05-20 LAB — BRAIN NATRIURETIC PEPTIDE: B Natriuretic Peptide: 161.2 pg/mL — ABNORMAL HIGH (ref 0.0–100.0)

## 2021-05-20 LAB — RESP PANEL BY RT-PCR (FLU A&B, COVID) ARPGX2
Influenza A by PCR: NEGATIVE
Influenza B by PCR: NEGATIVE
SARS Coronavirus 2 by RT PCR: NEGATIVE

## 2021-05-20 MED ORDER — ONDANSETRON HCL 4 MG/2ML IJ SOLN
4.0000 mg | Freq: Once | INTRAMUSCULAR | Status: DC
  Filled 2021-05-20: qty 2

## 2021-05-20 MED ORDER — CEPHALEXIN 500 MG PO CAPS
500.0000 mg | ORAL_CAPSULE | Freq: Once | ORAL | Status: AC
  Administered 2021-05-20: 500 mg via ORAL
  Filled 2021-05-20: qty 1

## 2021-05-20 MED ORDER — CEPHALEXIN 500 MG PO CAPS: 500.0000 mg | ORAL_CAPSULE | Freq: Four times a day (QID) | ORAL | 0 refills | Status: AC

## 2021-05-20 MED ORDER — METHYLPREDNISOLONE SODIUM SUCC 40 MG IJ SOLR
40.0000 mg | Freq: Once | INTRAMUSCULAR | Status: AC
  Administered 2021-05-20: 40 mg via INTRAVENOUS
  Filled 2021-05-20: qty 1

## 2021-05-20 MED ORDER — DIPHENHYDRAMINE HCL 25 MG PO CAPS
50.0000 mg | ORAL_CAPSULE | Freq: Once | ORAL | Status: AC
  Filled 2021-05-20: qty 2

## 2021-05-20 MED ORDER — DIPHENHYDRAMINE HCL 50 MG/ML IJ SOLN
50.0000 mg | Freq: Once | INTRAMUSCULAR | Status: AC
  Administered 2021-05-20: 50 mg via INTRAVENOUS
  Filled 2021-05-20: qty 1

## 2021-05-20 MED ORDER — IOHEXOL 350 MG/ML SOLN
80.0000 mL | Freq: Once | INTRAVENOUS | Status: AC | PRN
  Administered 2021-05-20: 80 mL via INTRAVENOUS

## 2021-05-20 MED ORDER — MORPHINE SULFATE (PF) 4 MG/ML IV SOLN
4.0000 mg | Freq: Once | INTRAVENOUS | Status: DC
  Filled 2021-05-20: qty 1

## 2021-05-20 NOTE — ED Provider Notes (Signed)
?[facility] DEPT ?Provider Note ? ? ?CSN: [id number] ?Arrival date & time: 05/20/21  1353 ? ?  ? ?History ? ?Chief Complaint  ?Patient presents with  ? Shortness of Breath  ? ? ?Rachael [name] is a 88 y.o. female with a history of COPD, hyperlipidemia, hypothyroidism, peripheral vascular disease.  Presents to the emergency department with a chief plaint of shortness of breath.  Patient reports that she has been dealing with shortness of breath since October 2022.  Patient states that shortness of breath has been intermittent since then.  Patient is that she had worsening of shortness of breath this morning.  States that she woke from sleep and felt "out of breath."  This continued throughout the morning which prompted patient to call her doctor's office advised her to come to the emergency department for further evaluation. ? ?Patient denies any worsening of symptoms with exertion or supine position.  Patient denies any paroxysmal nocturnal dyspnea, fever, chills, cough, palpitations. ? ? ?Shortness of Breath ? ?  ? ?Home Medications ?Prior to Admission medications   ?Medication Sig Start Date End Date Taking? Authorizing Provider  ?aspirin 81 MG EC tablet Take one every other day 02/24/21   [name], MD  ?atorvastatin (LIPITOR) 80 MG tablet Take 1 tablet (80 mg total) by mouth daily. 02/23/21   Early Osmond, MD  ?Cholecalciferol (VITAMIN D3 PO) Take 250 mg by mouth 4 (four) times daily.    [provider]  ?Cyanocobalamin (B-12 PO) Take 1 tablet by mouth daily.     [provider]  ?dapagliflozin propanediol (FARXIGA) 10 MG TABS tablet Take 1 tablet (10 mg total) by mouth every morning. 05/05/21 06/04/21  Tolia, Sunit, DO  ?fluticasone (FLONASE) 50 MCG/ACT nasal spray Place 1 spray into both nostrils 2 (two) times daily. 02/20/21 03/22/21  Martinique, Betty G, MD  ?furosemide (LASIX) 20 MG tablet Take 1 tablet (20 mg total) by mouth daily. Take one tablet twice a day  EVERY OTHER DAY ?Patient taking differently: Take 20 mg by mouth daily. Take one tablet twice a day EVERY OTHER DAY 05/05/21 06/04/21  Rex Kras, DO  ?levothyroxine (SYNTHROID) 112 MCG tablet Take 1 tablet (112 mcg total) by mouth daily before breakfast. 10/27/20   [name], MD  ?Nutritional Supplements (JUICE PLUS FIBRE PO) Juice Plus    [provider]  ?   ? ?Allergies    ?Prednisone, Shellfish allergy, Percocet [oxycodone-acetaminophen], Iodinated contrast media, and Iodine   ? ?Review of Systems   ?Review of Systems  ?Respiratory:  Positive for shortness of breath.   ? ?Physical Exam ?Updated Vital Signs ?BP 133/73   Pulse 62   Temp 98.5 ?F (36.9 ?C) (Oral)   Resp 18   SpO2 100%  ?Physical Exam ? ?ED Results / Procedures / Treatments   ?Labs ?(all labs ordered are listed, but only abnormal results are displayed) ?Labs Reviewed  ?BRAIN NATRIURETIC PEPTIDE - Abnormal; Notable for the following components:  ?    Result Value  ? B Natriuretic Peptide 161.2 (*)   ? All other components within normal limits  ?COMPREHENSIVE METABOLIC PANEL - Abnormal; Notable for the following components:  ? Glucose, Bld 115 (*)   ? Creatinine, Ser 1.01 (*)   ? Total Bilirubin 2.4 (*)   ? GFR, Estimated 55 (*)   ? All other components within normal limits  ?CBC WITH DIFFERENTIAL/PLATELET - Abnormal; Notable for the following components:  ? WBC 11.5 (*)   ?  Monocytes Absolute 1.8 (*)   ? All other components within normal limits  ?TROPONIN I (HIGH SENSITIVITY) - Abnormal; Notable for the following components:  ? Troponin I (High Sensitivity) 29 (*)   ? All other components within normal limits  ?TROPONIN I (HIGH SENSITIVITY) - Abnormal; Notable for the following components:  ? Troponin I (High Sensitivity) 27 (*)   ? All other components within normal limits  ?RESP PANEL BY RT-PCR (FLU A&B, COVID) [id number]  ? ? ?EKG ?None ? ?Radiology ?DG Chest 2 View ? ?Result Date: 05/20/2021 ?CLINICAL DATA:  Shortness of breath,  intermittent since October. EXAM: CHEST - 2 VIEW COMPARISON:  04/09/2021 FINDINGS: Heart size upper limits of normal. Aortic atherosclerosis. Emphysema and pulmonary scarring. No sign of consolidation, collapse or measurable effusion. No acute bone finding. IMPRESSION: Chronic lung disease. Borderline cardiomegaly. Aortic atherosclerosis. No acute process identified. Electronically Signed   By: Nelson Chimes M.D.   On: 05/20/2021 14:25  ? ?CT Angio Chest PE W and/or Wo Contrast ? ?Result Date: 05/20/2021 ?CLINICAL DATA:  Pulmonary embolism (PE) suspected, high prob. Dyspnea EXAM: CT ANGIOGRAPHY CHEST WITH CONTRAST TECHNIQUE: Multidetector CT imaging of the chest was performed using the standard protocol during bolus administration of intravenous contrast. Multiplanar CT image reconstructions and MIPs were obtained to evaluate the vascular anatomy. RADIATION DOSE REDUCTION: This exam was performed according to the departmental dose-optimization program which includes automated exposure control, adjustment of the mA and/or kV according to patient size and/or use of iterative reconstruction technique. CONTRAST:  71m OMNIPAQUE IOHEXOL 350 MG/ML SOLN COMPARISON:  03/18/2015 FINDINGS: Cardiovascular: Extensive multi-vessel coronary artery calcification. Mild cardiomegaly. No pericardial effusion. The pulmonary arterial tree is adequately opacified. No intraluminal filling defect identified to suggest acute pulmonary embolism. The central pulmonary arteries are mildly enlarged suggesting changes of pulmonary arterial hypertension. Mild atherosclerotic calcification within the thoracic aorta. No aortic aneurysm. Aberrant origin of the right subclavian artery noted. Wide patency of the arch vasculature proximally. Mediastinum/Nodes: Visualized thyroid unremarkable. No pathologic thoracic adenopathy. Esophagus unremarkable. Lungs/Pleura: Progressive subpleural reticulation and sparse ground-glass pulmonary infiltrate and the  costophrenic angles bilaterally, most in keeping with progressive mild subpleural pulmonary fibrosis. No pneumothorax or pleural effusion. Central airways are widely patent. Upper Abdomen: No acute abnormality. Musculoskeletal: No acute bone abnormality. Osseous structures are age-appropriate. Review of the MIP images confirms the above findings. IMPRESSION: No pulmonary embolism. Extensive multi-vessel coronary artery calcification. Mild cardiomegaly. Morphologic changes in keeping with pulmonary arterial hypertension. Mild progressive subpleural pulmonary fibrosis. Aortic Atherosclerosis (ICD10-I70.0). Electronically Signed   By: AFidela SalisburyM.D.   On: 05/20/2021 20:21   ? ?Procedures ?Procedures  ? ? ?Medications Ordered in ED ?Medications  ?morphine (PF) 4 MG/ML injection 4 mg (4 mg Intravenous Not Given 05/20/21 1943)  ?ondansetron (ZOFRAN) injection 4 mg (4 mg Intravenous Not Given 05/20/21 1943)  ?methylPREDNISolone sodium succinate (SOLU-MEDROL) 40 mg/mL injection 40 mg (40 mg Intravenous Given 05/20/21 1602)  ?diphenhydrAMINE (BENADRYL) capsule 50 mg ( Oral See Alternative 05/20/21 1941)  ?  Or  ?diphenhydrAMINE (BENADRYL) injection 50 mg (50 mg Intravenous Given 05/20/21 1941)  ?iohexol (OMNIPAQUE) 350 MG/ML injection 80 mL (80 mLs Intravenous Contrast Given 05/20/21 1955)  ?cephALEXin (KEFLEX) capsule 500 mg (500 mg Oral Given 05/20/21 2057)  ? ? ?ED Course/ Medical Decision Making/ A&P ?  ?                        ?Medical Decision Making ?Amount and/or Complexity of  Data Reviewed ?Labs: ordered. ?Radiology: ordered. ? ?Risk ?Prescription drug management. ? ? ?Alert 88 year old female in no acute distress, nontoxic-appearing.  Presents to the emergency department with a chief complaint of shortness of breath. ? ?Information is obtained from patient and patient's daughter at bedside.  Past medical records were reviewed including previous provider notes, labs, and imaging.  Patient's medical history as outlined  in HPI which complicates her care ? ?Per chart review patient has had recent extensive cardiac work-up to look for potential cardiac source of patient's shortness of breath.  Cardiac work-up was reassuring and

## 2021-05-20 NOTE — Progress Notes (Signed)
Patient is aware of results.

## 2021-05-20 NOTE — Discharge Instructions (Addendum)
You came to the emergency department today to be evaluated for your shortness of breath.  Your CT scan did not show any blood clots or signs of infection.  The remainder of your lab results were reassuring.  The exact cause of your shortness of breath is unknown at this time.  Please follow-up with your primary care doctor and pulmonologist in the outpatient setting. ? ?There is concern for possible infection to your right lower leg.  Due to this you were started on the antibiotic Keflex.  Please take 1 pill 4 times a day for the next 7 days. ? ?You may have diarrhea from the antibiotics.  It is very important that you continue to take the antibiotics even if you get diarrhea unless a medical professional tells you that you may stop taking them.  If you stop too early the bacteria you are being treated for will become stronger and you may need different, more powerful antibiotics that have more side effects and worsening diarrhea.  Please stay well hydrated and consider probiotics as they may decrease the severity of your diarrhea.  ? ?Get help right away if: ?Your shortness of breath gets worse. ?You have shortness of breath when you are resting. ?You feel light-headed or you faint. ?You have a cough that is not controlled with medicines. ?You cough up blood. ?You have pain with breathing. ?You have pain in your chest, arms, shoulders, or abdomen. ?You have a fever. ?

## 2021-05-20 NOTE — ED Triage Notes (Signed)
Pt arrives for shob X2 weeks that was worse when she woke up this morning.  ? ?Reports being recently diagnosed with emphysema and has no meds.  ? ?Denies using any inhalers.  ?

## 2021-05-20 NOTE — ED Provider Triage Note (Signed)
Emergency Medicine Provider Triage Evaluation Note ? ?Rachael [name] , a 88 y.o. female  was evaluated in triage.  Pt complains of shortness of breath.  Reports that she has been dealing with shortness of breath intermittently since October 2022.  States that she woke this morning with severe shortness of breath.  Patient states that feels like she cannot catch her breath.  Patient denies any worsening shortness of breath with exertion or supine position.  Patient reports that she has constant swelling to the bilateral lower extremities states that she has had significant weight loss since being started on Farxiga and Lasix.  Patient has been taking these medications as prescribed. ? ?Review of Systems  ?Positive: Shortness of breath, leg swelling ?Negative: Chest pain, palpitations, lightheadedness, syncope, fever, chills ? ?Physical Exam  ?BP (!) 144/84   Pulse 66   Temp 98.5 ?F (36.9 ?C) (Oral)   Resp (!) 25   SpO2 100%  ?Gen:   Awake, no distress   ?Resp:  Patient has increased effort of breathing, lungs clear to auscultation bilaterally ?MSK:   Moves extremities without difficulty  ?Other:   ? ?Medical Decision Making  ?Medically screening exam initiated at 2:05 PM.  Appropriate orders placed.  Rachael [name] was informed that the remainder of the evaluation will be completed by another provider, this initial triage assessment does not replace that evaluation, and the importance of remaining in the ED until their evaluation is complete. ? ? ?  ?Loni Beckwith, PA-C ?05/20/21 1406 ? ?

## 2021-05-21 ENCOUNTER — Telehealth: Payer: Self-pay

## 2021-05-21 NOTE — Telephone Encounter (Signed)
Transition Care Management Follow-up Telephone Call ?Date of discharge and from where: 05/20/21 [facility] ED ?How have you been since you were released from the hospital? Pt states she still feels intermittently SOB. ?Any questions or concerns? No ? ?Items Reviewed: ?Did the pt receive and understand the discharge instructions provided? Yes   ?Medications obtained and verified? Yes  ?Other?  Pt states she was told by "someone" (not in the ED) to restart the [place] ?Any new allergies since your discharge? No  ?Dietary orders reviewed? No ?Do you have support at home? Yes  ? ?Home Care and Equipment/Supplies: ?Were home health services ordered? not applicable ? ? ?Functional Questionnaire: (I = Independent and D = Dependent) ?ADLs: I ? ?Bathing/Dressing- I ? ?Meal Prep- I ? ?Eating- I ? ?Maintaining continence- I ? ?Transferring/Ambulation- I ? ?Managing Meds- I ? ?Follow up appointments reviewed: ? ?PCP Hospital f/u appt confirmed? Yes  Scheduled to see Dr Sarajane Jews on 05/27/21 @ 1045. ?[facility] f/u appt confirmed? No  Pt is waiting to hear from pulmonology ?Are transportation arrangements needed? No  ?If their condition worsens, is the pt aware to call PCP or go to the Emergency Dept.? Yes ?Was the patient provided with contact information for the PCP's office or ED? Yes ?Was to pt encouraged to call back with questions or concerns? Yes  ?

## 2021-05-21 NOTE — Telephone Encounter (Signed)
Patient stated that that she was diagnosed with Emphysema, and her SOB was not heart related and wanted to know if she needs to stay on Farxiga. Please advise.  ?

## 2021-05-22 ENCOUNTER — Other Ambulatory Visit: Payer: Self-pay | Admitting: Family Medicine

## 2021-05-22 ENCOUNTER — Ambulatory Visit (INDEPENDENT_AMBULATORY_CARE_PROVIDER_SITE_OTHER): Payer: Medicare HMO | Admitting: Family Medicine

## 2021-05-22 ENCOUNTER — Encounter: Payer: Self-pay | Admitting: Family Medicine

## 2021-05-22 VITALS — BP 96/60 | HR 70 | Temp 98.6°F | Wt 220.6 lb

## 2021-05-22 DIAGNOSIS — T7840XD Allergy, unspecified, subsequent encounter: Secondary | ICD-10-CM

## 2021-05-22 DIAGNOSIS — J8410 Pulmonary fibrosis, unspecified: Secondary | ICD-10-CM | POA: Diagnosis not present

## 2021-05-22 DIAGNOSIS — J449 Chronic obstructive pulmonary disease, unspecified: Secondary | ICD-10-CM | POA: Diagnosis not present

## 2021-05-22 DIAGNOSIS — Z961 Presence of intraocular lens: Secondary | ICD-10-CM | POA: Diagnosis not present

## 2021-05-22 MED ORDER — ALBUTEROL SULFATE HFA 108 (90 BASE) MCG/ACT IN AERS: 2.0000 | INHALATION_SPRAY | RESPIRATORY_TRACT | 2 refills | Status: DC | PRN

## 2021-05-22 NOTE — Progress Notes (Signed)
? ?  Subjective:  ? ? Patient ID: Rachael [name], female    DOB: [birth year], 88 y.o.   MRN: [id number] ? ?HPI ?Here to follow up on an ED visit on 05-20-21. We have been working her up for SOB for several months, but that day she felt more SOB than usual. No chest pain. She went to the ED where she seemed to be in no distress and her oxygen sats were good. A CXR showed chronic fibrotic changes as usual. Her EKG was normal. Her labs showed a BNP of 161 and a creatinine of 1.01. A CT angiogram of the chest obtained which showed no PE's, but it did show pulmonary fibrosis and pulmonary HTN. Due to a prior hx of reactions to contrast media, she was premedicated with Solumedrol and Benadryl, and she seemed to tolerate the scan well. She was sent home to follow up with [place]. Then yesterday morning after waking up she discovered her face was swollen and red, and she had tingling in her hands and feet. The facial swelling and redness slowly faded away over the next 24 hours. Today she feels fine except for some tingling in the feet. We had referred her to [facility], and she will be seeing Windy Canny NP sometime soon.  ? ? ?Review of Systems  ?Constitutional: Negative.   ?HENT:  Positive for facial swelling.   ?Eyes: Negative.   ?Respiratory:  Positive for shortness of breath. Negative for cough and wheezing.   ?Cardiovascular: Negative.   ?Gastrointestinal: Negative.   ?Genitourinary: Negative.   ?Neurological:  Positive for numbness.  ? ?   ?Objective:  ? Physical Exam ?Constitutional:   ?   Appearance: She is not ill-appearing.  ?   Comments: Walking with her walker   ?HENT:  ?   Mouth/Throat:  ?   Comments: There is no swelling or erythema on the face  ?Cardiovascular:  ?   Rate and Rhythm: Normal rate and regular rhythm.  ?   Pulses: Normal pulses.  ?   Heart sounds: Normal heart sounds.  ?Pulmonary:  ?   Effort: Pulmonary effort is normal.  ?   Breath sounds: Normal breath sounds.  ?Neurological:  ?   General:  No focal deficit present.  ?   Mental Status: She is alert. Mental status is at baseline.  ? ? ? ? ? ?   ?Assessment & Plan:  ?Her facial swelling and erythema, as well as the tingling in the hands and feet, are minor delayed allergic reactions to the contrast dye that was used in the ED. She is over all this now and back to baseline. She will see [facility] as above for the lung fibrosis. We will also give her an albuterol inhaler to use as needed, and I instructed her in the proper technique to use it.We spent a total of ( 33  ) minutes reviewing records and discussing these issues.  ?Alysia Penna, MD ? ? ?

## 2021-05-25 ENCOUNTER — Telehealth: Payer: Self-pay

## 2021-05-25 ENCOUNTER — Telehealth: Payer: Self-pay | Admitting: Family Medicine

## 2021-05-25 DIAGNOSIS — I83891 Varicose veins of right lower extremities with other complications: Secondary | ICD-10-CM | POA: Diagnosis not present

## 2021-05-25 NOTE — Telephone Encounter (Signed)
She does have diastolic dysfunction on the recent echocardiogram which affects overall relaxation of the heart and therefore would benefit from [place]. ? ?I am glad she was evaluated from a pulmonary standpoint and being treated for emphysema. ? ?Her shortness of breath is a combination of both heart and lungs, lungs being predominant. ? ?Hope this helps answers your questions. ? ?Rachael [name] Terri Skains, DO, [facility]

## 2021-05-25 NOTE — Telephone Encounter (Signed)
Patient called and cancel her appointment because per patient her issue was not her heart it was her Lungs. ?

## 2021-05-25 NOTE — Telephone Encounter (Signed)
Pt is calling and would like a referral to see pulmonologist dr byrum for emphysema  ?

## 2021-05-26 NOTE — Telephone Encounter (Signed)
Called and spoke with patient regarding her questions on continuing Farxiga. Patient will be in to pick up samples and PA paperwork for Farxiga.

## 2021-05-26 NOTE — Telephone Encounter (Signed)
Pt needs an appt on 5/18

## 2021-05-26 NOTE — Telephone Encounter (Signed)
Called spoke with patient.   Informed patient that referral has been sent to [facility].   Patient was pleased, and stated will wait on call from [facility] office.  ?

## 2021-05-27 ENCOUNTER — Inpatient Hospital Stay: Payer: Medicare HMO | Admitting: Family Medicine

## 2021-05-28 ENCOUNTER — Ambulatory Visit: Payer: Medicare HMO | Admitting: Cardiology

## 2021-05-29 ENCOUNTER — Telehealth: Payer: Self-pay | Admitting: Family Medicine

## 2021-05-29 DIAGNOSIS — M7989 Other specified soft tissue disorders: Secondary | ICD-10-CM | POA: Diagnosis not present

## 2021-05-29 DIAGNOSIS — Z09 Encounter for follow-up examination after completed treatment for conditions other than malignant neoplasm: Secondary | ICD-10-CM | POA: Diagnosis not present

## 2021-05-29 NOTE — Telephone Encounter (Signed)
[place] spoke to the pharmacist tech.   Dr. Terri Skains was last prescribing provider for this medication.   Pharmacy will forward to correct office.

## 2021-05-29 NOTE — Telephone Encounter (Signed)
Pt requesting clarity on how to take  furosemide (LASIX) 20 MG tablet  the directions are unclear and pharmacy will not fill and advised her to reach out to [place] before they fill it.  [facility] # [place], [place] Phone:  [phone]  Fax:  [phone]     Pt also asking about a referral to a pulmonary provider at [facility]

## 2021-06-01 ENCOUNTER — Encounter: Payer: Self-pay | Admitting: Family

## 2021-06-01 ENCOUNTER — Telehealth: Payer: Self-pay

## 2021-06-01 ENCOUNTER — Ambulatory Visit (INDEPENDENT_AMBULATORY_CARE_PROVIDER_SITE_OTHER): Payer: Medicare HMO | Admitting: Family

## 2021-06-01 VITALS — BP 100/68 | HR 58 | Temp 98.0°F | Ht 67.0 in | Wt 217.0 lb

## 2021-06-01 DIAGNOSIS — F064 Anxiety disorder due to known physiological condition: Secondary | ICD-10-CM

## 2021-06-01 DIAGNOSIS — J449 Chronic obstructive pulmonary disease, unspecified: Secondary | ICD-10-CM | POA: Diagnosis not present

## 2021-06-01 MED ORDER — LORAZEPAM 0.5 MG PO TABS: 0.5000 mg | ORAL_TABLET | Freq: Every day | ORAL | 0 refills | Status: DC | PRN

## 2021-06-01 MED ORDER — BUDESONIDE-FORMOTEROL FUMARATE 80-4.5 MCG/ACT IN AERO: 2.0000 | INHALATION_SPRAY | Freq: Two times a day (BID) | RESPIRATORY_TRACT | 0 refills | Status: DC

## 2021-06-01 NOTE — Patient Instructions (Signed)
It was very nice to see you today!  I have sent over Symbicort inhaler for you to use for your breathing. Take 1 puff, hold in, then take the 2nd puff twice a day. This is a maintenance inhaler to use every day.  Rinse your mouth with water after using. OK to continue using the short acting Albuterol inhaler if still needed.   I also have sent over a few pills of Ativan to help calm your anxiety over not being able to breathe well.  ONLY take this as needed, not daily.      PLEASE NOTE:  If you had any lab tests please let us know if you have not heard back within a few days. You may see your results on MyChart before we have a chance to review them but we will give you a call once they are reviewed by [place]. If we ordered any referrals today, please let us know if you have not heard from their office within the next week.

## 2021-06-01 NOTE — Progress Notes (Unsigned)
Subjective:     Patient ID: Rachael [name], female    DOB: 07-Apr-1933, 88 y.o.   MRN: [id number]  Chief Complaint  Patient presents with   Shortness of Breath    Pt c/o SOB, difficulty breathing. Has been getting progressively worse.     HPI: Shortness of breath:    Assessment & Plan:   Problem List Items Addressed This Visit   None   Outpatient Medications Prior to Visit  Medication Sig Dispense Refill   albuterol (VENTOLIN HFA) 108 (90 Base) MCG/ACT inhaler Inhale 2 puffs into the lungs every 4 (four) hours as needed for wheezing or shortness of breath. 18 g 2   aspirin 81 MG EC tablet Take one every other day 30 tablet    atorvastatin (LIPITOR) 80 MG tablet Take 1 tablet (80 mg total) by mouth daily. 90 tablet 3   Cholecalciferol (VITAMIN D3 PO) Take 250 mg by mouth 4 (four) times daily.     Cyanocobalamin (B-12 PO) Take 1 tablet by mouth daily.      dapagliflozin propanediol (FARXIGA) 10 MG TABS tablet Take 1 tablet (10 mg total) by mouth every morning. 30 tablet 0   furosemide (LASIX) 20 MG tablet Take 1 tablet (20 mg total) by mouth daily. Take one tablet twice a day EVERY OTHER DAY (Patient taking differently: Take 20 mg by mouth daily. Take one tablet twice a day EVERY OTHER DAY) 30 tablet 0   levothyroxine (SYNTHROID) 112 MCG tablet Take 1 tablet (112 mcg total) by mouth daily before breakfast. 90 tablet 3   Nutritional Supplements (JUICE PLUS FIBRE PO) Juice Plus     fluticasone (FLONASE) 50 MCG/ACT nasal spray Place 1 spray into both nostrils 2 (two) times daily. 16 g 0   No facility-administered medications prior to visit.    Past Medical History:  Diagnosis Date   Breast cancer ([place])    R   CAROTID STENOSIS    Chronic venous insufficiency    CONTACT DERMATITIS    DUPUYTREN'S CONTRACTURE    Edema    pt  states had edema of feet and legs has been on lasix per dr. to help   GLUCOSE INTOLERANCE    HLD (hyperlipidemia)    Hypothyroidism    Dr. [place] Dach in  [place]    Obesity    Osteoarthritis    bilat hips, severe   PVD (peripheral vascular disease) ([place])    mild carotid 11/05   VITAMIN D DEFICIENCY     Past Surgical History:  Procedure Laterality Date   2D echo  11/29/2003   normal LV size. normal EF    BACK SURGERY  12/06/2016   L3-L5 laminectomies per Dr. Maryellen Pile at [place]  05/11/2011   right breast   BREAST LUMPECTOMY     right breast   [place]     incisional hernia   INGUINAL HERNIA REPAIR     JOINT REPLACEMENT Bilateral    Dr. Maureen Ralphs   normal caronary arteries     per pt. 2003   right rotator cuff surgery     SPINE SURGERY     TONSILLECTOMY     TOTAL HIP ARTHROPLASTY  05/2008 and 10/2008   B hip - alusio    Allergies  Allergen Reactions   Prednisone Other (See Comments)    Muscle weakness  Caused severe numbness  and weakness of  extremity   Shellfish Allergy Anaphylaxis and Hives    Hives all over the body. Hives all over the body   Percocet [Oxycodone-Acetaminophen] Hives   Iodinated Contrast Media Other (See Comments)    PT IS NOT AWARE OF IODINE ALLERGY, PREMEDICATED FOR PRIOR PREMEDS ONLY//A.C. - unknown reaction   Iodine Hives       Objective:    Physical Exam  BP 100/68 (BP Location: Left Arm, Patient Position: Sitting, Cuff Size: Large)   Pulse (!) 58   Temp 98 F (36.7 C) (Temporal)   Ht '5\' 7"'$  (1.702 m)   Wt 217 lb (98.4 kg)   SpO2 91%   BMI 33.99 kg/m  Wt Readings from Last 3 Encounters:  06/01/21 217 lb (98.4 kg)  05/22/21 220 lb 9 oz (100 kg)  05/19/21 219 lb (99.3 kg)        No orders of the defined types were placed in this encounter.   Jeanie Sewer, NP

## 2021-06-01 NOTE — Telephone Encounter (Signed)
Pt message was sent to PCP for advise 

## 2021-06-01 NOTE — Telephone Encounter (Signed)
--  Caller states she is having trouble breathing that has been going and getting worse, has albuterol inhaler, last used at 900 am took 2 puffs, instructed to take 2 puffs now, ,Hx emphysema, denies other symptoms,  06/01/2021 12:35:49 PM See HCP within 4 Hours (or PCP triage) Nicki Reaper, RN, [facility]  Comments User: Burna Sis, RN Date/Time Eilene Ghazi Time): 06/01/2021 12:35:31 PM Caller seems SOB instructed to take 2 puffs of albuterol, continued to be SOB and she stated albuterol helped her and denies SOB at rest, Called office and spoke with Hinton Dyer and warm transferred caller to her and appt was made at 220 pm today  Referrals REFERRED TO PCP OFFICE

## 2021-06-02 ENCOUNTER — Encounter: Payer: Self-pay | Admitting: Family

## 2021-06-02 NOTE — Assessment & Plan Note (Signed)
pt having increased SOB, breathing very shallow today during visit, lungs with mild expiratory wheezing. Has appt with [facility] in Sept, starting symbicort, advised pt and dtr present on use & SE. pt dtr concerned if weaning off her prednisone (taken for chronic back pain)  may have been masking lung sx., advised to discuss with PCP. OK to continue to use rescue inhaler prn.

## 2021-06-02 NOTE — Telephone Encounter (Signed)
She should be taking 20 mg daily

## 2021-06-03 ENCOUNTER — Ambulatory Visit (INDEPENDENT_AMBULATORY_CARE_PROVIDER_SITE_OTHER): Payer: Medicare HMO | Admitting: Family Medicine

## 2021-06-03 ENCOUNTER — Encounter: Payer: Self-pay | Admitting: Family Medicine

## 2021-06-03 VITALS — BP 110/60 | HR 63 | Temp 98.8°F | Wt 215.3 lb

## 2021-06-03 DIAGNOSIS — M159 Polyosteoarthritis, unspecified: Secondary | ICD-10-CM | POA: Diagnosis not present

## 2021-06-03 DIAGNOSIS — J449 Chronic obstructive pulmonary disease, unspecified: Secondary | ICD-10-CM

## 2021-06-03 MED ORDER — PREDNISONE 5 MG PO TABS: 5.0000 mg | ORAL_TABLET | Freq: Every day | ORAL | 2 refills | Status: DC

## 2021-06-03 NOTE — Progress Notes (Signed)
   Subjective:    Patient ID: Rachael [name], female    DOB: Oct 18, 1933, 88 y.o.   MRN: [id number]  HPI Here to ask about getting back on Prednisone. She had been taking 5 mg daily and she says she felt so much better while taking it. Now that she has stopped her legs hurt worse, she has more trouble walking, and she is more SOB. She saw [facility] a few days ago and they did not have too much to offer her. She is scheduled to see [facility] in September. She has emphysema and chronic bronchitis. She is very resistant to using inhalers.   Review of Systems  Constitutional: Negative.   Respiratory:  Positive for shortness of breath. Negative for cough and wheezing.   Cardiovascular:  Positive for leg swelling. Negative for chest pain and palpitations.  Neurological:  Positive for weakness.      Objective:   Physical Exam Constitutional:      Appearance: Normal appearance.     Comments: Using her walker   Cardiovascular:     Rate and Rhythm: Normal rate and regular rhythm.     Pulses: Normal pulses.     Heart sounds: Normal heart sounds.  Pulmonary:     Effort: Pulmonary effort is normal.     Breath sounds: Normal breath sounds.  Musculoskeletal:     Comments: 2+ edema in both ankles   Neurological:     Mental Status: She is alert.          Assessment & Plan:  She has COPD and OA, and I agree these would all improve on low dose Prednisone. We will start her back on 5 mg daily. Recheck in 3 months.  Alysia Penna, MD

## 2021-06-04 ENCOUNTER — Ambulatory Visit: Payer: Medicare HMO | Admitting: Nurse Practitioner

## 2021-06-04 ENCOUNTER — Encounter: Payer: Self-pay | Admitting: Nurse Practitioner

## 2021-06-04 VITALS — BP 128/62 | HR 62 | Temp 97.9°F | Ht 67.0 in | Wt 215.0 lb

## 2021-06-04 DIAGNOSIS — J40 Bronchitis, not specified as acute or chronic: Secondary | ICD-10-CM

## 2021-06-04 DIAGNOSIS — J410 Simple chronic bronchitis: Secondary | ICD-10-CM | POA: Insufficient documentation

## 2021-06-04 DIAGNOSIS — R0683 Snoring: Secondary | ICD-10-CM

## 2021-06-04 DIAGNOSIS — I5033 Acute on chronic diastolic (congestive) heart failure: Secondary | ICD-10-CM | POA: Insufficient documentation

## 2021-06-04 DIAGNOSIS — R0609 Other forms of dyspnea: Secondary | ICD-10-CM | POA: Diagnosis not present

## 2021-06-04 DIAGNOSIS — J849 Interstitial pulmonary disease, unspecified: Secondary | ICD-10-CM | POA: Diagnosis not present

## 2021-06-04 DIAGNOSIS — I5032 Chronic diastolic (congestive) heart failure: Secondary | ICD-10-CM

## 2021-06-04 LAB — NITRIC OXIDE: Nitric Oxide: 5

## 2021-06-04 NOTE — Assessment & Plan Note (Signed)
G1DD on echo from November. Chronic LE edema r/t venous insufficiency, overall stable per pt. Follow up with cardiology as scheduled.

## 2021-06-04 NOTE — Assessment & Plan Note (Deleted)
Chronic bronchitis/frequent bronchi

## 2021-06-04 NOTE — Assessment & Plan Note (Addendum)
Started back on daily low dose prednisone by her PCP for her chronic back pain as well as frequent bronchitis/DOE.  She also had some midflow reversibility on previous PFTs and evidence of wheezing on exam per note by provider at her PCP's office. She is prednisone responsive as well and good response to albuterol. Could have an asthmatic component- advised that she trial Symbicort to see if she received any benefit. FeNO was normal today but she is on chronic low dose daily prednisone

## 2021-06-04 NOTE — Assessment & Plan Note (Signed)
ILD identified on CTA chest; findings suggestive of mild pulmonary fibrosis. No significant exposure hx or evidence of connective tissue diseases. She will need a HRCT at some point; will schedule at her follow up given she just had a CTA chest. Most recent PFTs were from December with normal FVC and TLC; did have a moderate diffusion defect - repeat Spiro/DLCO to assess for possible progression. She was referred to [facility] by her PCP; however, has not heard anything. Referred her to Dr. [name] or Dr. Chase Caller with our clinic for ILD consult.

## 2021-06-04 NOTE — Assessment & Plan Note (Addendum)
Suspect her DOE is multifactorial given her cardiac history, recent findings of pulmonary fibrosis on CTA, deconditioning, and obesity. Walking oximetry today on room air without desaturations. She was previous told she has emphysema but she is a never smoker, no evidence of emphysematous changes on CTA, and she had no obstruction on her PFTs to correlate to this diagnosis, which we discussed today.  Patient Instructions  Trial Symbicort inhaler 2 puffs Twice daily. Brush tongue and rinse mouth afterwards Continue Albuterol inhaler 2 puffs every 6 hours as needed for shortness of breath or wheezing. Notify if symptoms persist despite rescue inhaler/neb use. Continue prednisone 5 mg daily  Home sleep study ordered today  Spirometry and DLCO scheduled today   Follow up with Dr. Chase Caller or Dr. [name] for ILD consult after spirometry and DLCO. If symptoms do not improve or worsen, please contact office for sooner follow up or seek emergency care.

## 2021-06-04 NOTE — Patient Instructions (Addendum)
Trial Symbicort inhaler 2 puffs Twice daily. Brush tongue and rinse mouth afterwards Continue Albuterol inhaler 2 puffs every 6 hours as needed for shortness of breath or wheezing. Notify if symptoms persist despite rescue inhaler/neb use. Continue prednisone 5 mg daily  Home sleep study ordered today  Spirometry and DLCO scheduled today   Follow up with Dr. Chase Caller or Dr. [name] for ILD consult after spirometry and DLCO. If symptoms do not improve or worsen, please contact office for sooner follow up or seek emergency care.

## 2021-06-04 NOTE — Progress Notes (Signed)
$'@Patient'G$  ID: Rachael [name], female    DOB: 1933/09/02, 88 y.o.   MRN: [id number]  Chief Complaint  Patient presents with   Follow-up    Pt states she has been told that she has emphysema even though she is never a smoker. States that she does become SOB with exertion.    Referring provider: [name], MD  HPI: 88 year old female, never smoker followed for DOE and frequent episodes of bronchitis.  She is a patient Dr. Janee Morn and last seen in office on 01/09/2021 for pulmonary consult.  Past medical history significant for chronic venous insufficiency, PVD, TIA, carotid artery stenosis, hypothyroid, CKD 3, HLD, history of right breast cancer, vitamin D deficiency, obesity, lumbar spine stenosis, COVID infection December 2021, tachybradycardia syndrome, hypothyroidism, PFO.  TEST/EVENTS:  11/24/2020 echocardiogram: EF 66 5%.  G1 DD.  RV size and function is normal.  There is normal PASP.  LA is moderately dilated.  Trivial MR.  Trivial AR.  There is a small PFO with predominantly left-to-right shunting 12/22/2020 PFTs: FVC 90, FEV1 98, ratio 81, TLC 100, DLCOcor 65.  No significant BD; did have mid flow reversibility.  Normal spirometry with moderate diffusion defect 05/20/2021 CTA chest: There is extensive multivessel coronary artery felt calcification.  Mild cardiomegaly is present.  No evidence of PE.  Central pulmonary arteries are mildly enlarged.  There is progressive subpleural reticulation and sparse groundglass pulmonary infiltrates and the costophrenic angles bilaterally, most in keeping with progressive mild subpleural pulmonary fibrosis.  01/09/2021: OV with Dr. Annamaria Boots for pulmonary consult.  Having DOE since September.  Had PFT done prior to visit which just showed a mild diffusion defect.  She did report that she has been having some issues with her heart and feels like this occurred at the same time that her increased dyspnea did; clearly has episodes of exertional dyspnea with  episodes of palpitation. No current cough; tx for bronchitis a few weeks prior. D dimer positive - perfusion scan neg for PE.   06/04/2021: Today-follow-up Patient presents today with granddaughter for overdue follow-up.  She continues to have DOE even upon minimal exertion, relatively unchanged when compared to how she was feeling in December.  She did have a recent flare and was seen in the ED on 5/10 with full workup which did not identifiy any acute process. Her CTA did show evidence of mild pulmonary fibrosis. She then saw a provider in her PCP's office and was noted to have wheezing upon exam and was advised to start Symbicort twice daily.  She reports that she ended up seeing her primary PCP yesterday, who started her back on daily low dose prednisone and advised her to not start the Symbicort. She does have chronic back pain which prednisone has helped in the past with. She has an occasional dry cough. Denies any hemoptysis, night sweats, fevers, weight loss. She has chronic leg swelling and wears TED hose for this; denies any recent worsening. She has a rescue inhaler at home, which helps with her breathing sometimes.   Her granddaughter also notes that she snores at night and occasionally will gasp for air when she is sleeping. She sleeps the entire night and wakes once to use the restroom. She wakes feeling well rested in the morning but gets easily fatigued as the day progresses. She denies morning headaches, narcolepsy or cataplexy. She does not currently drive.   She is a never smoker, was exposed to heavy cigarette smoke from her father as  a child, and has no significant occupational or home exposures. Denies joint pain/swelling or redness, dry eyes or mouth, skin lesions or rashes.   Ep[place] 6  Allergies  Allergen Reactions   Prednisone Other (See Comments)    Muscle weakness  Caused severe numbness and weakness of  extremity   Shellfish Allergy Anaphylaxis and Hives    Hives all over  the body. Hives all over the body   Percocet [Oxycodone-Acetaminophen] Hives   Iodinated Contrast Media Other (See Comments)    PT IS NOT AWARE OF IODINE ALLERGY, PREMEDICATED FOR PRIOR PREMEDS ONLY//A.C. - unknown reaction   Iodine Hives    Immunization History  Administered Date(s) Administered   PPD Test 03/20/2015   Td 02/11/1988, 11/14/2007, 02/22/2014   Zoster Recombinat (Shingrix) 05/10/2017, 08/23/2017    Past Medical History:  Diagnosis Date   Anxiety state 11/14/2007   Qualifier: Diagnosis of  By: Jenny Reichmann MD, Hunt Oris    Breast cancer [facility])    R   CAROTID STENOSIS    Chronic venous insufficiency    CONTACT DERMATITIS    DUPUYTREN'S CONTRACTURE    Edema    pt  states had edema of feet and legs has been on lasix per dr. to help   GLUCOSE INTOLERANCE    HLD (hyperlipidemia)    Hypothyroidism    Dr. Ala Dach in [place]    Obesity    Osteoarthritis    bilat hips, severe   PVD (peripheral vascular disease) ([place])    mild carotid 11/05   VITAMIN D DEFICIENCY     Tobacco History: Social History   Tobacco Use  Smoking Status Never   Passive exposure: Past  Smokeless Tobacco Never   Counseling given: Not Answered   Outpatient Medications Prior to Visit  Medication Sig Dispense Refill   albuterol (VENTOLIN HFA) 108 (90 Base) MCG/ACT inhaler Inhale 2 puffs into the lungs every 4 (four) hours as needed for wheezing or shortness of breath. 18 g 2   aspirin 81 MG EC tablet Take one every other day 30 tablet    atorvastatin (LIPITOR) 80 MG tablet Take 1 tablet (80 mg total) by mouth daily. 90 tablet 3   budesonide-formoterol (SYMBICORT) 80-4.5 MCG/ACT inhaler Inhale 2 puffs into the lungs 2 (two) times daily. 1 each 0   Cholecalciferol (VITAMIN D3 PO) Take 250 mg by mouth 4 (four) times daily.     Cyanocobalamin (B-12 PO) Take 1 tablet by mouth daily.      dapagliflozin propanediol (FARXIGA) 10 MG TABS tablet Take 1 tablet (10 mg total) by  mouth every morning. 30 tablet 0   furosemide (LASIX) 20 MG tablet Take 1 tablet (20 mg total) by mouth daily. Take one tablet twice a day EVERY OTHER DAY (Patient taking differently: Take 20 mg by mouth daily. Take one tablet twice a day EVERY OTHER DAY) 30 tablet 0   levothyroxine (SYNTHROID) 112 MCG tablet Take 1 tablet (112 mcg total) by mouth daily before breakfast. 90 tablet 3   LORazepam (ATIVAN) 0.5 MG tablet Take 1 tablet (0.5 mg total) by mouth daily as needed for anxiety (to relax breathing). 15 tablet 0   Nutritional Supplements (JUICE PLUS FIBRE PO) Juice Plus     predniSONE (DELTASONE) 5 MG tablet Take 1 tablet (5 mg total) by mouth daily with breakfast. 30 tablet 2   No facility-administered medications prior to visit.     Review of Systems:   Constitutional: No weight  loss or gain, night sweats, fevers, chills. +daytime fatigue HEENT: No headaches, difficulty swallowing, tooth/dental problems, or sore throat. No sneezing, itching, ear ache, nasal congestion, or post nasal drip CV:  +swelling in lower extremities (chronic, stable). No chest pain, orthopnea, PND, anasarca, dizziness, palpitations, syncope Resp: +shortness of breath with exertion; dry cough. No excess mucus or change in color of mucus. No hemoptysis. No wheezing.  No chest wall deformity GI:  No heartburn, indigestion, abdominal pain, nausea, vomiting, diarrhea, change in bowel habits, loss of appetite, bloody stools.  Skin: No rash, lesions, ulcerations MSK:  No joint pain or swelling.  No decreased range of motion.  No back pain. Neuro: No dizziness or lightheadedness.  Psych: No depression or anxiety. Mood stable.     Physical Exam:  BP 128/62 (BP Location: Left Wrist, Patient Position: Sitting, Cuff Size: Normal)   Pulse 62   Temp 97.9 F (36.6 C) (Oral)   Ht '5\' 7"'$  (1.702 m)   Wt 215 lb (97.5 kg)   SpO2 98% Comment: RA  BMI 33.67 kg/m   GEN: Pleasant, interactive, chronically-ill appearing;  obese; in no acute distress. HEENT:  Normocephalic and atraumatic. PERRLA. Sclera white. Nasal turbinates pink, moist and patent bilaterally. No rhinorrhea present. Oropharynx pink and moist, without exudate or edema. No lesions, ulcerations, or postnasal drip.  NECK:  Supple w/ fair ROM. No JVD present. Normal carotid impulses w/o bruits. Thyroid symmetrical with no goiter or nodules palpated. No lymphadenopathy.   CV: RRR, no m/r/g, no peripheral edema. Pulses intact, +2 bilaterally. No cyanosis, pallor or clubbing. PULMONARY:  Unlabored, regular breathing. Bibasilar crackles bilaterally; otherwise clear A&P w/o wheezes/rales/rhonchi. No accessory muscle use. No dullness to percussion. GI: BS present and normoactive. Soft, non-tender to palpation. No organomegaly or masses detected. No CVA tenderness. MSK: No erythema, warmth or tenderness. Cap refil <2 sec all extrem. No deformities or joint swelling noted.  Neuro: A/Ox3. No focal deficits noted.   Skin: Warm, no lesions or rashe Psych: Normal affect and behavior. Judgement and thought content appropriate.     Lab Results:  CBC    Component Value Date/Time   WBC 11.5 (H) 05/20/2021 1405   RBC 4.61 05/20/2021 1405   HGB 14.0 05/20/2021 1405   HGB 13.1 10/15/2015 0943   HCT 40.8 05/20/2021 1405   HCT 40.5 10/15/2015 0943   PLT 334 05/20/2021 1405   PLT 238 10/15/2015 0943   MCV 88.5 05/20/2021 1405   MCV 87.3 10/15/2015 0943   MCH 30.4 05/20/2021 1405   MCHC 34.3 05/20/2021 1405   RDW 14.5 05/20/2021 1405   RDW 14.8 (H) 10/15/2015 0943   LYMPHSABS 2.0 05/20/2021 1405   LYMPHSABS 2.1 10/15/2015 0943   MONOABS 1.8 (H) 05/20/2021 1405   MONOABS 1.1 (H) 10/15/2015 0943   EOSABS 0.1 05/20/2021 1405   EOSABS 0.3 10/15/2015 0943   BASOSABS 0.0 05/20/2021 1405   BASOSABS 0.0 10/15/2015 0943    BMET    Component Value Date/Time   NA 137 05/20/2021 1405   NA 142 05/12/2021 1259   NA 143 10/15/2015 0943   K 3.8 05/20/2021 1405    K 4.1 10/15/2015 0943   CL 100 05/20/2021 1405   CL 104 06/20/2012 1421   CO2 26 05/20/2021 1405   CO2 28 10/15/2015 0943   GLUCOSE 115 (H) 05/20/2021 1405   GLUCOSE 94 10/15/2015 0943   GLUCOSE 107 (H) 06/20/2012 1421   BUN 23 05/20/2021 1405   BUN 18 05/12/2021 1259  BUN 21.4 10/15/2015 0943   CREATININE 1.01 (H) 05/20/2021 1405   CREATININE 1.21 (H) 12/26/2020 1511   CREATININE 0.8 10/15/2015 0943   CALCIUM 10.0 05/20/2021 1405   CALCIUM 10.3 10/15/2015 0943   GFRNONAA 55 (L) 05/20/2021 1405   GFRAA >60 02/26/2018 0713    BNP    Component Value Date/Time   BNP 161.2 (H) 05/20/2021 1405   BNP 370 (H) 12/26/2020 1511     Imaging:  DG Chest 2 View  Result Date: 05/20/2021 CLINICAL DATA:  Shortness of breath, intermittent since October. EXAM: CHEST - 2 VIEW COMPARISON:  04/09/2021 FINDINGS: Heart size upper limits of normal. Aortic atherosclerosis. Emphysema and pulmonary scarring. No sign of consolidation, collapse or measurable effusion. No acute bone finding. IMPRESSION: Chronic lung disease. Borderline cardiomegaly. Aortic atherosclerosis. No acute process identified. Electronically Signed   By: Nelson Chimes M.D.   On: 05/20/2021 14:25   CT Angio Chest PE W and/or Wo Contrast  Result Date: 05/20/2021 CLINICAL DATA:  Pulmonary embolism (PE) suspected, high prob. Dyspnea EXAM: CT ANGIOGRAPHY CHEST WITH CONTRAST TECHNIQUE: Multidetector CT imaging of the chest was performed using the standard protocol during bolus administration of intravenous contrast. Multiplanar CT image reconstructions and MIPs were obtained to evaluate the vascular anatomy. RADIATION DOSE REDUCTION: This exam was performed according to the departmental dose-optimization program which includes automated exposure control, adjustment of the mA and/or kV according to patient size and/or use of iterative reconstruction technique. CONTRAST:  72m OMNIPAQUE IOHEXOL 350 MG/ML SOLN COMPARISON:  03/18/2015  FINDINGS: Cardiovascular: Extensive multi-vessel coronary artery calcification. Mild cardiomegaly. No pericardial effusion. The pulmonary arterial tree is adequately opacified. No intraluminal filling defect identified to suggest acute pulmonary embolism. The central pulmonary arteries are mildly enlarged suggesting changes of pulmonary arterial hypertension. Mild atherosclerotic calcification within the thoracic aorta. No aortic aneurysm. Aberrant origin of the right subclavian artery noted. Wide patency of the arch vasculature proximally. Mediastinum/Nodes: Visualized thyroid unremarkable. No pathologic thoracic adenopathy. Esophagus unremarkable. Lungs/Pleura: Progressive subpleural reticulation and sparse ground-glass pulmonary infiltrate and the costophrenic angles bilaterally, most in keeping with progressive mild subpleural pulmonary fibrosis. No pneumothorax or pleural effusion. Central airways are widely patent. Upper Abdomen: No acute abnormality. Musculoskeletal: No acute bone abnormality. Osseous structures are age-appropriate. Review of the MIP images confirms the above findings. IMPRESSION: No pulmonary embolism. Extensive multi-vessel coronary artery calcification. Mild cardiomegaly. Morphologic changes in keeping with pulmonary arterial hypertension. Mild progressive subpleural pulmonary fibrosis. Aortic Atherosclerosis (ICD10-I70.0). Electronically Signed   By: AFidela SalisburyM.D.   On: 05/20/2021 20:21   DG Knee Complete 4 Views Left  Result Date: 05/07/2021 CLINICAL DATA:  Pain after fall 4 weeks ago. EXAM: LEFT KNEE - COMPLETE 4+ VIEW COMPARISON:  None. FINDINGS: The bones are diffusely under mineralized. No acute fracture. Normal alignment, no dislocation. Tricompartmental osteoarthritis with joint space narrowing and spurring. Spurring of the tibial spines. There is chondrocalcinosis. No significant knee joint effusion. Advanced vascular calcifications are seen. IMPRESSION: 1. No fracture  or dislocation of the left knee. 2. Tricompartmental osteoarthritis with chondrocalcinosis. 3. Advanced vascular calcifications. Electronically Signed   By: MKeith RakeM.D.   On: 05/07/2021 21:37   PCV MYOCARDIAL PERFUSION WITH LEXISCAN  Result Date: 05/17/2021 Lexiscan Nuclear stress test 05/13/2021: Nondiagnostic ECG stress. The heart rate response was consistent with Lexiscan. Myocardial perfusion is normal. Normal left ventricle.  LV rest volume 109 ml.  LV stress volume 128 ml. TID ratio 1.17. Overall LV systolic function is mildly abnormal without regional  wall motion abnormalities. Stress LV EF: 44%.  Global hypokinesis of the left ventricle. No previous exam available for comparison. Intermediate risk study due to reduced LVEF. Correlate with echocardiogram.         Latest Ref Rng & Units 01/01/2021    9:46 AM  PFT Results  FVC-Pre L 2.47    FVC-Predicted Pre % 89    FVC-Post L 2.50    FVC-Predicted Post % 90    Pre FEV1/FVC % % 75    Post FEV1/FCV % % 81    FEV1-Pre L 1.86    FEV1-Predicted Pre % 90    FEV1-Post L 2.02    DLCO uncorrected ml/min/mmHg 14.09    DLCO UNC% % 68    DLCO corrected ml/min/mmHg 13.47    DLCO COR %Predicted % 65    DLVA Predicted % 79    TLC L 5.54    TLC % Predicted % 100    RV % Predicted % 113      Lab Results  Component Value Date   NITRICOXIDE 5 06/04/2021        Assessment & Plan:   Dyspnea Suspect her DOE is multifactorial given her cardiac history, recent findings of pulmonary fibrosis on CTA, deconditioning, and obesity. Walking oximetry today on room air without desaturations. She was previous told she has emphysema but she is a never smoker, no evidence of emphysematous changes on CTA, and she had no obstruction on her PFTs to correlate to this diagnosis, which we discussed today.  Patient Instructions  Trial Symbicort inhaler 2 puffs Twice daily. Brush tongue and rinse mouth afterwards Continue Albuterol inhaler 2 puffs  every 6 hours as needed for shortness of breath or wheezing. Notify if symptoms persist despite rescue inhaler/neb use. Continue prednisone 5 mg daily  Home sleep study ordered today  Spirometry and DLCO scheduled today   Follow up with Dr. Chase Caller or Dr. [name] for ILD consult after spirometry and DLCO. If symptoms do not improve or worsen, please contact office for sooner follow up or seek emergency care.    ILD (interstitial lung disease) ([place]) ILD identified on CTA chest; findings suggestive of mild pulmonary fibrosis. No significant exposure hx or evidence of connective tissue diseases. She will need a HRCT at some point; will schedule at her follow up given she just had a CTA chest. Most recent PFTs were from December with normal FVC and TLC; did have a moderate diffusion defect - repeat Spiro/DLCO to assess for possible progression. She was [facility] by her PCP; however, has not heard anything. Referred her to Dr. [name] or Dr. Chase Caller with our clinic for ILD consult.   Frequent episodes of bronchitis Started back on daily low dose prednisone by her PCP for her chronic back pain as well as frequent bronchitis/DOE.  She also had some midflow reversibility on previous PFTs and evidence of wheezing on exam per note by provider at her PCP's office. She is prednisone responsive as well and good response to albuterol. Could have an asthmatic component- advised that she trial Symbicort to see if she received any benefit. FeNO was normal today but she is on chronic low dose daily prednisone  Chronic diastolic CHF (congestive heart failure) ([place]) G1DD on echo from November. Chronic LE edema r/t venous insufficiency, overall stable per pt. Follow up with cardiology as scheduled.   Snoring With snoring, witnessed gasping, obesity, and DOE, I am concerned she could have OSA. HST ordered for further  evaluation. We discussed how untreated sleep apnea puts an individual at risk  for cardiac arrhthymias, pulm HTN, DM, stroke and increases their risk for daytime accidents. We also briefly reviewed treatment options including weight loss, side sleeping position, oral appliance, CPAP therapy or referral to ENT for possible surgical options   I spent 45 minutes of dedicated to the care of this patient on the date of this encounter to include pre-visit review of records, face-to-face time with the patient discussing conditions above, post visit ordering of testing, clinical documentation with the electronic health record, making appropriate referrals as documented, and communicating necessary findings to members of the patients care team.  Clayton Bibles, NP 06/04/2021  Pt aware and understands NP's role.

## 2021-06-04 NOTE — Assessment & Plan Note (Signed)
With snoring, witnessed gasping, obesity, and DOE, I am concerned she could have OSA. HST ordered for further evaluation. We discussed how untreated sleep apnea puts an individual at risk for cardiac arrhthymias, pulm HTN, DM, stroke and increases their risk for daytime accidents. We also briefly reviewed treatment options including weight loss, side sleeping position, oral appliance, CPAP therapy or referral to ENT for possible surgical options

## 2021-06-05 ENCOUNTER — Other Ambulatory Visit: Payer: Self-pay

## 2021-06-05 DIAGNOSIS — I5031 Acute diastolic (congestive) heart failure: Secondary | ICD-10-CM

## 2021-06-05 MED ORDER — FUROSEMIDE 20 MG PO TABS: ORAL_TABLET | ORAL | 0 refills | Status: DC

## 2021-06-05 NOTE — Telephone Encounter (Signed)
Spoke with pt pharmacy clarified directions for pt Lasix Rx to once daily per Dr Sarajane Jews, [facility] with pt state that she has been taking 1 tab daily

## 2021-06-10 ENCOUNTER — Ambulatory Visit (INDEPENDENT_AMBULATORY_CARE_PROVIDER_SITE_OTHER): Payer: Medicare HMO | Admitting: Family Medicine

## 2021-06-10 ENCOUNTER — Emergency Department (HOSPITAL_COMMUNITY): Payer: Medicare HMO

## 2021-06-10 ENCOUNTER — Encounter (HOSPITAL_COMMUNITY): Payer: Self-pay

## 2021-06-10 ENCOUNTER — Other Ambulatory Visit: Payer: Self-pay

## 2021-06-10 ENCOUNTER — Inpatient Hospital Stay (HOSPITAL_COMMUNITY)
Admission: EM | Admit: 2021-06-10 | Discharge: 2021-06-12 | DRG: 189 | Disposition: A | Discharge status: Home / Self Care | Payer: Medicare HMO | Attending: Internal Medicine | Admitting: Internal Medicine

## 2021-06-10 ENCOUNTER — Encounter: Payer: Self-pay | Admitting: Family Medicine

## 2021-06-10 VITALS — HR 76 | Temp 100.2°F

## 2021-06-10 DIAGNOSIS — R778 Other specified abnormalities of plasma proteins: Secondary | ICD-10-CM | POA: Diagnosis present

## 2021-06-10 DIAGNOSIS — M79602 Pain in left arm: Secondary | ICD-10-CM | POA: Diagnosis not present

## 2021-06-10 DIAGNOSIS — I130 Hypertensive heart and chronic kidney disease with heart failure and stage 1 through stage 4 chronic kidney disease, or unspecified chronic kidney disease: Secondary | ICD-10-CM | POA: Diagnosis present

## 2021-06-10 DIAGNOSIS — Z888 Allergy status to other drugs, medicaments and biological substances status: Secondary | ICD-10-CM | POA: Diagnosis not present

## 2021-06-10 DIAGNOSIS — N1831 Chronic kidney disease, stage 3a: Secondary | ICD-10-CM | POA: Diagnosis not present

## 2021-06-10 DIAGNOSIS — I2510 Atherosclerotic heart disease of native coronary artery without angina pectoris: Secondary | ICD-10-CM | POA: Diagnosis not present

## 2021-06-10 DIAGNOSIS — R0600 Dyspnea, unspecified: Secondary | ICD-10-CM | POA: Diagnosis not present

## 2021-06-10 DIAGNOSIS — Z7951 Long term (current) use of inhaled steroids: Secondary | ICD-10-CM | POA: Diagnosis not present

## 2021-06-10 DIAGNOSIS — J449 Chronic obstructive pulmonary disease, unspecified: Secondary | ICD-10-CM | POA: Diagnosis not present

## 2021-06-10 DIAGNOSIS — D72829 Elevated white blood cell count, unspecified: Secondary | ICD-10-CM | POA: Diagnosis present

## 2021-06-10 DIAGNOSIS — R4182 Altered mental status, unspecified: Secondary | ICD-10-CM | POA: Diagnosis not present

## 2021-06-10 DIAGNOSIS — J984 Other disorders of lung: Secondary | ICD-10-CM | POA: Diagnosis not present

## 2021-06-10 DIAGNOSIS — Z8249 Family history of ischemic heart disease and other diseases of the circulatory system: Secondary | ICD-10-CM

## 2021-06-10 DIAGNOSIS — E559 Vitamin D deficiency, unspecified: Secondary | ICD-10-CM | POA: Diagnosis not present

## 2021-06-10 DIAGNOSIS — K8689 Other specified diseases of pancreas: Secondary | ICD-10-CM | POA: Diagnosis not present

## 2021-06-10 DIAGNOSIS — E669 Obesity, unspecified: Secondary | ICD-10-CM | POA: Diagnosis not present

## 2021-06-10 DIAGNOSIS — Q2112 Patent foramen ovale: Secondary | ICD-10-CM

## 2021-06-10 DIAGNOSIS — I10 Essential (primary) hypertension: Secondary | ICD-10-CM | POA: Diagnosis not present

## 2021-06-10 DIAGNOSIS — Z96643 Presence of artificial hip joint, bilateral: Secondary | ICD-10-CM | POA: Diagnosis present

## 2021-06-10 DIAGNOSIS — Z91013 Allergy to seafood: Secondary | ICD-10-CM

## 2021-06-10 DIAGNOSIS — Z79899 Other long term (current) drug therapy: Secondary | ICD-10-CM | POA: Diagnosis not present

## 2021-06-10 DIAGNOSIS — M25512 Pain in left shoulder: Secondary | ICD-10-CM | POA: Diagnosis not present

## 2021-06-10 DIAGNOSIS — I872 Venous insufficiency (chronic) (peripheral): Secondary | ICD-10-CM | POA: Diagnosis present

## 2021-06-10 DIAGNOSIS — K5730 Diverticulosis of large intestine without perforation or abscess without bleeding: Secondary | ICD-10-CM | POA: Diagnosis not present

## 2021-06-10 DIAGNOSIS — R410 Disorientation, unspecified: Secondary | ICD-10-CM | POA: Diagnosis not present

## 2021-06-10 DIAGNOSIS — E039 Hypothyroidism, unspecified: Secondary | ICD-10-CM | POA: Diagnosis not present

## 2021-06-10 DIAGNOSIS — Z7982 Long term (current) use of aspirin: Secondary | ICD-10-CM

## 2021-06-10 DIAGNOSIS — M549 Dorsalgia, unspecified: Secondary | ICD-10-CM | POA: Diagnosis present

## 2021-06-10 DIAGNOSIS — I739 Peripheral vascular disease, unspecified: Secondary | ICD-10-CM | POA: Diagnosis not present

## 2021-06-10 DIAGNOSIS — J9601 Acute respiratory failure with hypoxia: Secondary | ICD-10-CM | POA: Diagnosis not present

## 2021-06-10 DIAGNOSIS — Z91041 Radiographic dye allergy status: Secondary | ICD-10-CM

## 2021-06-10 DIAGNOSIS — Z853 Personal history of malignant neoplasm of breast: Secondary | ICD-10-CM | POA: Diagnosis not present

## 2021-06-10 DIAGNOSIS — I5032 Chronic diastolic (congestive) heart failure: Secondary | ICD-10-CM | POA: Diagnosis not present

## 2021-06-10 DIAGNOSIS — G8929 Other chronic pain: Secondary | ICD-10-CM | POA: Diagnosis present

## 2021-06-10 DIAGNOSIS — Q630 Accessory kidney: Secondary | ICD-10-CM | POA: Diagnosis not present

## 2021-06-10 DIAGNOSIS — J84112 Idiopathic pulmonary fibrosis: Secondary | ICD-10-CM | POA: Diagnosis present

## 2021-06-10 DIAGNOSIS — I5033 Acute on chronic diastolic (congestive) heart failure: Secondary | ICD-10-CM | POA: Diagnosis not present

## 2021-06-10 DIAGNOSIS — A419 Sepsis, unspecified organism: Secondary | ICD-10-CM | POA: Diagnosis not present

## 2021-06-10 DIAGNOSIS — Z885 Allergy status to narcotic agent status: Secondary | ICD-10-CM

## 2021-06-10 DIAGNOSIS — Z8616 Personal history of COVID-19: Secondary | ICD-10-CM

## 2021-06-10 DIAGNOSIS — J849 Interstitial pulmonary disease, unspecified: Secondary | ICD-10-CM | POA: Diagnosis not present

## 2021-06-10 DIAGNOSIS — Z79890 Hormone replacement therapy (postmenopausal): Secondary | ICD-10-CM | POA: Diagnosis not present

## 2021-06-10 DIAGNOSIS — J8410 Pulmonary fibrosis, unspecified: Secondary | ICD-10-CM | POA: Diagnosis not present

## 2021-06-10 DIAGNOSIS — M2578 Osteophyte, vertebrae: Secondary | ICD-10-CM | POA: Diagnosis not present

## 2021-06-10 DIAGNOSIS — Z7952 Long term (current) use of systemic steroids: Secondary | ICD-10-CM

## 2021-06-10 DIAGNOSIS — Z9049 Acquired absence of other specified parts of digestive tract: Secondary | ICD-10-CM

## 2021-06-10 DIAGNOSIS — F419 Anxiety disorder, unspecified: Secondary | ICD-10-CM | POA: Diagnosis not present

## 2021-06-10 DIAGNOSIS — E785 Hyperlipidemia, unspecified: Secondary | ICD-10-CM | POA: Diagnosis present

## 2021-06-10 DIAGNOSIS — R9431 Abnormal electrocardiogram [ECG] [EKG]: Secondary | ICD-10-CM | POA: Diagnosis not present

## 2021-06-10 DIAGNOSIS — M19012 Primary osteoarthritis, left shoulder: Secondary | ICD-10-CM | POA: Diagnosis not present

## 2021-06-10 LAB — RESP PANEL BY RT-PCR (FLU A&B, COVID) ARPGX2
Influenza A by PCR: NEGATIVE
Influenza B by PCR: NEGATIVE
SARS Coronavirus 2 by RT PCR: NEGATIVE

## 2021-06-10 LAB — COMPREHENSIVE METABOLIC PANEL
ALT: 15 U/L (ref 0–44)
AST: 18 U/L (ref 15–41)
Albumin: 3.4 g/dL — ABNORMAL LOW (ref 3.5–5.0)
Alkaline Phosphatase: 73 U/L (ref 38–126)
Anion gap: 11 (ref 5–15)
BUN: 16 mg/dL (ref 8–23)
CO2: 21 mmol/L — ABNORMAL LOW (ref 22–32)
Calcium: 9.8 mg/dL (ref 8.9–10.3)
Chloride: 104 mmol/L (ref 98–111)
Creatinine, Ser: 1.12 mg/dL — ABNORMAL HIGH (ref 0.44–1.00)
GFR, Estimated: 48 mL/min — ABNORMAL LOW (ref >60)
Glucose, Bld: 123 mg/dL — ABNORMAL HIGH (ref 70–99)
Potassium: 3.7 mmol/L (ref 3.5–5.1)
Sodium: 136 mmol/L (ref 135–145)
Total Bilirubin: 2.8 mg/dL — ABNORMAL HIGH (ref 0.3–1.2)
Total Protein: 6.7 g/dL (ref 6.5–8.1)

## 2021-06-10 LAB — CBC WITH DIFFERENTIAL/PLATELET
Abs Immature Granulocytes: 0.09 10*3/uL — ABNORMAL HIGH (ref 0.00–0.07)
Basophils Absolute: 0 10*3/uL (ref 0.0–0.1)
Basophils Relative: 0 %
Eosinophils Absolute: 0 10*3/uL (ref 0.0–0.5)
Eosinophils Relative: 0 %
HCT: 41.7 % (ref 36.0–46.0)
Hemoglobin: 13.6 g/dL (ref 12.0–15.0)
Immature Granulocytes: 1 %
Lymphocytes Relative: 7 %
Lymphs Abs: 1.1 10*3/uL (ref 0.7–4.0)
MCH: 29.1 pg (ref 26.0–34.0)
MCHC: 32.6 g/dL (ref 30.0–36.0)
MCV: 89.3 fL (ref 80.0–100.0)
Monocytes Absolute: 1.3 10*3/uL — ABNORMAL HIGH (ref 0.1–1.0)
Monocytes Relative: 9 %
Neutro Abs: 12.2 10*3/uL — ABNORMAL HIGH (ref 1.7–7.7)
Neutrophils Relative %: 83 %
Platelets: 386 10*3/uL (ref 150–400)
RBC: 4.67 MIL/uL (ref 3.87–5.11)
RDW: 14.2 % (ref 11.5–15.5)
WBC: 14.6 10*3/uL — ABNORMAL HIGH (ref 4.0–10.5)
nRBC: 0 % (ref 0.0–0.2)

## 2021-06-10 LAB — PROTIME-INR
INR: 1.2 (ref 0.8–1.2)
Prothrombin Time: 15.1 seconds (ref 11.4–15.2)

## 2021-06-10 LAB — LACTIC ACID, PLASMA
Lactic Acid, Venous: 1.6 mmol/L (ref 0.5–1.9)
Lactic Acid, Venous: 0.7 mmol/L (ref 0.5–1.9)

## 2021-06-10 LAB — TROPONIN I (HIGH SENSITIVITY)
Troponin I (High Sensitivity): 41 ng/L — ABNORMAL HIGH (ref <18)
Troponin I (High Sensitivity): 57 ng/L — ABNORMAL HIGH (ref <18)

## 2021-06-10 LAB — BRAIN NATRIURETIC PEPTIDE: B Natriuretic Peptide: 291.6 pg/mL — ABNORMAL HIGH (ref 0.0–100.0)

## 2021-06-10 MED ORDER — DIPHENHYDRAMINE HCL 25 MG PO CAPS
50.0000 mg | ORAL_CAPSULE | Freq: Once | ORAL | Status: AC
  Filled 2021-06-10: qty 2

## 2021-06-10 MED ORDER — SODIUM CHLORIDE 0.9 % IV SOLN
500.0000 mg | INTRAVENOUS | Status: DC
  Administered 2021-06-10: 500 mg via INTRAVENOUS
  Filled 2021-06-10: qty 5

## 2021-06-10 MED ORDER — MORPHINE SULFATE (PF) 4 MG/ML IV SOLN
4.0000 mg | Freq: Once | INTRAVENOUS | Status: AC
  Administered 2021-06-10: 4 mg via INTRAVENOUS
  Filled 2021-06-10: qty 1

## 2021-06-10 MED ORDER — LACTATED RINGERS IV SOLN: INTRAVENOUS | Status: DC

## 2021-06-10 MED ORDER — LACTATED RINGERS IV BOLUS (SEPSIS)
1000.0000 mL | Freq: Once | INTRAVENOUS | Status: AC
  Administered 2021-06-10: 1000 mL via INTRAVENOUS

## 2021-06-10 MED ORDER — DIPHENHYDRAMINE HCL 50 MG/ML IJ SOLN
50.0000 mg | Freq: Once | INTRAMUSCULAR | Status: AC
  Administered 2021-06-10: 50 mg via INTRAVENOUS
  Filled 2021-06-10: qty 1

## 2021-06-10 MED ORDER — METHYLPREDNISOLONE SODIUM SUCC 40 MG IJ SOLR
40.0000 mg | Freq: Once | INTRAMUSCULAR | Status: AC
  Administered 2021-06-10: 40 mg via INTRAVENOUS
  Filled 2021-06-10: qty 1

## 2021-06-10 MED ORDER — SODIUM CHLORIDE 0.9 % IV SOLN
2.0000 g | INTRAVENOUS | Status: DC
  Administered 2021-06-10: 2 g via INTRAVENOUS
  Filled 2021-06-10 (×2): qty 20

## 2021-06-10 NOTE — Sepsis Progress Note (Signed)
ELink tracking the code sepsis.  

## 2021-06-10 NOTE — Sepsis Progress Note (Signed)
Notified bedside nurse of need to administer antibiotics.  

## 2021-06-10 NOTE — ED Provider Notes (Signed)
[facility] EMERGENCY DEPARTMENT Provider Note   CSN: [id number] Arrival date & time: 06/10/21  1717     History  Chief Complaint  Patient presents with   Altered Mental Status   Shortness of Breath    Rachael [name] is a 88 y.o. female.   Altered Mental Status Associated symptoms: abdominal pain and fever   Shortness of Breath Associated symptoms: abdominal pain and fever   Associated symptoms: no chest pain    88 year old female with medical history significant for peripheral vascular disease, chronic venous insufficiency, osteoarthritis, obesity, carotid stenosis, HLD, hypothyroidism, breast cancer, anxiety, recent finding of pulmonary hypertension on CT angiogram presenting to the emergency department with multiple complaints.  The history is by the patient, the patient's daughter and the electronic medical record.  The patient was seen at her PCP office and was found to be dyspneic and hypoxic with O2 saturations 84%.  She has had a low-grade fever, generalized weakness, left arm pain and swelling for the last several days with some confusion.  Her temperature was reportedly 100.2 at her PCP office.  Given her hypoxia, left arm pain and swelling, dyspnea, there was some concern for PE and left upper extremity DVT and so she was sent to the emergency department for further evaluation.  On arrival, the patient continued to complain of dyspnea, worse when lying flat.  She denies any new bilateral lower extremity edema.  She does endorse low-grade fevers and chills.  She denies any active chest pain.  She endorses pain in her left shoulder radiating down her left arm with associated swelling.  She endorses difficulty with range of motion of the left arm.  She denies any recent falls or trauma.  She additionally endorses mild nausea and abdominal discomfort.  Home Medications Prior to Admission medications   Medication Sig Start Date End Date Taking? Authorizing Provider   albuterol (VENTOLIN HFA) 108 (90 Base) MCG/ACT inhaler Inhale 2 puffs into the lungs every 4 (four) hours as needed for wheezing or shortness of breath. 05/22/21   [name], MD  aspirin 81 MG EC tablet Take one every other day 02/24/21   [name], MD  atorvastatin (LIPITOR) 80 MG tablet Take 1 tablet (80 mg total) by mouth daily. 02/23/21   Early Osmond, MD  budesonide-formoterol (SYMBICORT) 80-4.5 MCG/ACT inhaler Inhale 2 puffs into the lungs 2 (two) times daily. Patient not taking: Reported on 06/10/2021 06/01/21   Jeanie Sewer, NP  Cholecalciferol (VITAMIN D3 PO) Take 250 mg by mouth 4 (four) times daily.    [provider]  Cyanocobalamin (B-12 PO) Take 1 tablet by mouth daily.     [provider]  furosemide (LASIX) 20 MG tablet Take one tablet once a day 06/05/21   [name], MD  levothyroxine (SYNTHROID) 112 MCG tablet Take 1 tablet (112 mcg total) by mouth daily before breakfast. 10/27/20   [name], MD  LORazepam (ATIVAN) 0.5 MG tablet Take 1 tablet (0.5 mg total) by mouth daily as needed for anxiety (to relax breathing). 06/01/21   Jeanie Sewer, NP  Nutritional Supplements (JUICE PLUS FIBRE PO) Juice Plus    [provider]  predniSONE (DELTASONE) 5 MG tablet Take 1 tablet (5 mg total) by mouth daily with breakfast. 06/03/21   [name], MD      Allergies    Prednisone, Shellfish allergy, Percocet [oxycodone-acetaminophen], Iodinated contrast media, and Iodine    Review of Systems  Review of Systems  Constitutional:  Positive for chills, fatigue and fever.  Respiratory:  Positive for shortness of breath.   Cardiovascular:  Negative for chest pain and leg swelling.  Gastrointestinal:  Positive for abdominal pain.  Musculoskeletal:  Positive for arthralgias and myalgias.  All other systems reviewed and are negative.  Physical Exam Updated Vital Signs BP (!) 124/55   Pulse 60   Temp 100 F (37.8 C) (Oral)   Resp  (!) 21   Ht '5\' 7"'$  (1.702 m)   Wt 97.5 kg   SpO2 96%   BMI 33.67 kg/m  Physical Exam Vitals and nursing note reviewed.  Constitutional:      General: She is not in acute distress.    Appearance: She is well-developed. She is obese. She is ill-appearing.  HENT:     Head: Normocephalic and atraumatic.  Eyes:     Conjunctiva/sclera: Conjunctivae normal.  Cardiovascular:     Rate and Rhythm: Normal rate and regular rhythm.     Heart sounds: No murmur heard. Pulmonary:     Effort: Pulmonary effort is normal. Tachypnea present. No respiratory distress.     Breath sounds: Normal breath sounds.     Comments: Clear lung sounds bilaterally in all lung fields Abdominal:     Palpations: Abdomen is soft.     Tenderness: There is abdominal tenderness in the epigastric area.     Comments: Tenderness to palpation of the epigastrium, no rebound or guarding  Musculoskeletal:        General: No swelling.     Cervical back: Neck supple.     Right lower leg: No edema.     Left lower leg: No edema.     Comments: Left upper arm swelling, tenderness to palpation about the left shoulder with pain with any attempts at range of motion, intact 2+ distal radial pulses, symmetric bilaterally, no erythema.     Skin:    General: Skin is warm and dry.     Capillary Refill: Capillary refill takes less than 2 seconds.  Neurological:     Mental Status: She is alert.     Comments: Alert and oriented x3, GCS 15, cranial nerves II through XII intact, sensation grossly intact all 4 extremities, motor function 5 out of 5 strength in all 4 extremities with some diminished motor function due to pain in the left shoulder.  Psychiatric:        Mood and Affect: Mood normal.    ED Results / Procedures / Treatments   Labs (all labs ordered are listed, but only abnormal results are displayed) Labs Reviewed  COMPREHENSIVE METABOLIC PANEL - Abnormal; Notable for the following components:      Result Value   CO2 21 (*)     Glucose, Bld 123 (*)    Creatinine, Ser 1.12 (*)    Albumin 3.4 (*)    Total Bilirubin 2.8 (*)    GFR, Estimated 48 (*)    All other components within normal limits  CBC WITH DIFFERENTIAL/PLATELET - Abnormal; Notable for the following components:   WBC 14.6 (*)    Neutro Abs 12.2 (*)    Monocytes Absolute 1.3 (*)    Abs Immature Granulocytes 0.09 (*)    All other components within normal limits  TROPONIN I (HIGH SENSITIVITY) - Abnormal; Notable for the following components:   Troponin I (High Sensitivity) 41 (*)    All other components within normal limits  RESP PANEL BY RT-PCR (FLU A&B, COVID) ARPGX2  CULTURE, BLOOD (ROUTINE X 2)  CULTURE, BLOOD (ROUTINE X 2)  LACTIC ACID, PLASMA  PROTIME-INR  LACTIC ACID, PLASMA  URINALYSIS, ROUTINE W REFLEX MICROSCOPIC  BRAIN NATRIURETIC PEPTIDE  TROPONIN I (HIGH SENSITIVITY)    EKG None  Radiology DG Chest 2 View  Result Date: 06/10/2021 CLINICAL DATA:  Sepsis EXAM: CHEST - 2 VIEW COMPARISON:  05/20/2021 FINDINGS: Frontal and lateral views of the chest demonstrates stable enlargement of the cardiac silhouette. No acute airspace disease, effusion, or pneumothorax. Chronic interstitial scarring. No acute bony abnormalities. IMPRESSION: 1. Stable enlarged cardiac silhouette.  No acute airspace disease. Electronically Signed   By: Randa Ngo M.D.   On: 06/10/2021 18:22   DG Shoulder Left  Result Date: 06/10/2021 CLINICAL DATA:  Left shoulder pain, decreased range of motion EXAM: LEFT SHOULDER - 2+ VIEW COMPARISON:  None Available. FINDINGS: Internal rotation, external rotation, and transscapular views of the left shoulder are obtained. There is severe glenohumeral and acromioclavicular joint osteoarthritis. Narrowing of the acromial humeral interval suggests chronic longstanding rotator cuff tear. No acute fracture, subluxation, or dislocation. Visualized portions of the left chest are clear. IMPRESSION: 1. Severe degenerative changes of  the left shoulder. No acute fracture. Electronically Signed   By: Randa Ngo M.D.   On: 06/10/2021 20:00    Procedures .Critical Care Performed by: Regan Lemming, MD Authorized by: Regan Lemming, MD   Critical care provider statement:    Critical care time (minutes):  30   Critical care was necessary to treat or prevent imminent or life-threatening deterioration of the following conditions:  Respiratory failure and sepsis   Critical care was time spent personally by me on the following activities:  Development of treatment plan with patient or surrogate, discussions with consultants, evaluation of patient's response to treatment, examination of patient, ordering and review of laboratory studies, ordering and review of radiographic studies, ordering and performing treatments and interventions, pulse oximetry, re-evaluation of patient's condition and review of old charts   Care discussed with: admitting provider   Ultrasound ED Echo  Date/Time: 06/10/2021 8:28 PM Performed by: Regan Lemming, MD Authorized by: Regan Lemming, MD   Procedure details:    Indications: dyspnea     Views: parasternal long axis view, parasternal short axis view and IVC view     Images: not archived     Limitations:  Body habitus, patient compliance and acoustic shadowing Findings:    Pericardium: no pericardial effusion     IVC: collapsed      Medications Ordered in ED Medications  lactated ringers infusion ( Intravenous New Bag/Given 06/10/21 1939)  cefTRIAXone (ROCEPHIN) 2 g in sodium chloride 0.9 % 100 mL IVPB (0 g Intravenous Stopped 06/10/21 1920)  azithromycin (ZITHROMAX) 500 mg in sodium chloride 0.9 % 250 mL IVPB (0 mg Intravenous Stopped 06/10/21 2249)  diphenhydrAMINE (BENADRYL) capsule 50 mg (has no administration in time range)    Or  diphenhydrAMINE (BENADRYL) injection 50 mg (has no administration in time range)  lactated ringers bolus 1,000 mL (0 mLs Intravenous Stopped 06/10/21 2249)   morphine (PF) 4 MG/ML injection 4 mg (4 mg Intravenous Given 06/10/21 1853)  methylPREDNISolone sodium succinate (SOLU-MEDROL) 40 mg/mL injection 40 mg (40 mg Intravenous Given 06/10/21 2043)    ED Course/ Medical Decision Making/ A&P Clinical Course as of 06/10/21 2259  Wed Jun 10, 2021  1802 WBC(!): 14.6 [JL]  1802 Resp(!): 24 [JL]  1802 Temp: 100 F (37.8 C) [JL]  1846 Troponin I (High Sensitivity)(!): 41 [JL]  Clinical Course User Index [JL] Regan Lemming, MD                           Medical Decision Making Amount and/or Complexity of Data Reviewed Labs: ordered. Decision-making details documented in ED Course. Radiology: ordered.  Risk Prescription drug management.    88 year old female with medical history significant for peripheral vascular disease, chronic venous insufficiency, osteoarthritis, obesity, carotid stenosis, HLD, hypothyroidism, breast cancer, anxiety, recent finding of pulmonary hypertension on CT angiogram presenting to the emergency department with multiple complaints.  The history is by the patient, the patient's daughter and the electronic medical record.  The patient was seen at her PCP office and was found to be dyspneic and hypoxic with O2 saturations 84%.  She has had a low-grade fever, generalized weakness, left arm pain and swelling for the last several days with some confusion.  Her temperature was reportedly 100.2 at her PCP office.  Given her hypoxia, left arm pain and swelling, dyspnea, there was some concern for PE and left upper extremity DVT and so she was sent to the emergency department for further evaluation.  On arrival, the patient continued to complain of dyspnea, worse when lying flat.  She denies any new bilateral lower extremity edema.  She does endorse low-grade fevers and chills.  She denies any active chest pain.  She endorses pain in her left shoulder radiating down her left arm with associated swelling.  She endorses difficulty with  range of motion of the left arm.  She denies any recent falls or trauma.  She additionally endorses mild nausea and abdominal discomfort.  On arrival, the patient was afebrile, temperature 100, not tachycardic, pulse 73, tachypneic RR 24, blood pressure 147/41, hypoxic, subsequently placed on 3 L O2 via nasal cannula with improvement in O2 saturations to 100%.  Sinus rhythm noted on cardiac telemetry.  Physical exam concerning for epigastric abdominal discomfort, pain upon palpation of the left shoulder with pain on attempts at range of motion, some swelling to the left upper extremity noted, distal pulses 2+ and symmetric, lungs clear to auscultation bilaterally, neuro exam nonfocal.  The patient laboratory evaluation was concerning for a leukocytosis to 14.6 and with her tachypnea and concern for respiratory source of infection, code sepsis was initiated on patient arrival.  The patient was covered with antibiotics to include Rocephin and azithromycin and an IV fluid bolus was administered. Given the patient's acute confusion, a comprehensive neurologic exam was performed and revealed the patient to be alert and oriented x3, GCS 15, no cranial nerve deficit, 5 out of 5 strength and intact sensation to light touch all 4 extremities.    Additional differential considerations include pulmonary embolism, left upper extremity DVT, interstitial lung disease and subsequent pulmonary hypertensive crisis, myocarditis/pericarditis.  Additionally considered aortic dissection as the etiology of the patient's presentation.  A POCUS echocardiogram was performed which revealed no evidence of pericardial effusion.  No evidence for cardiac tamponade.  Remainder of echocardiogram was limited by patient body habitus, acoustic shadowing and patient compliance due to epigastric tenderness on attempts at subxiphoid view.  Chest x-ray was performed which revealed no evidence of pulmonary edema, no other focal airspace opacity  noted, x-ray imaging of the left shoulder revealed severe degenerative changes of the left shoulder with no evidence of fracture or malalignment.  EKG revealed normal sinus rhythm, ventricular rate 6 7, no abnormal intervals, no ST segment changes to indicate active ischemia.  The patient unfortunately has a contrast allergy and needs to be premedicated in order to receive contrast.  Per our radiology department protocols, IV Solu-Medrol and Benadryl was ordered.  We will plan for CTA imaging to rule out dissection given the patient's shortness of breath and pain down her left arm.  Will order DVT ultrasound of the left upper extremity to evaluate for left upper extremity DVT.  Additionally, given the patient's shortness of breath and findings concerning for possible DVT, will obtain VQ scan to rule out acute PE.  Given the patient's hypoxic respiratory failure, will plan for admission to the hospital for further evaluation and treatment.   Laboratory evaluation significant for COVID-19 influenza PCR testing negative, CMP without significant electrolyte abnormality, mild non-anion gap acidosis with a bicarbonate of 21, creatinine mildly elevated to 1.12, mildly elevated T. bili to 2.8, otherwise generally unremarkable, lactic acid normal at 1.6, CBC with a leukocytosis to 14.6, hemoglobin stable at 13.6, initial troponin elevated at 41, repeat troponin pending.  The patient denies any chest pain indicative of ACS at this time.  Her EKG revealed no evidence of STEMI.  Imaging evaluation pending at time of signout.  Plan for imaging evaluation and admit the patient.  Signout given to Dr. Leonette Monarch at 2330.    Final Clinical Impression(s) / ED Diagnoses Final diagnoses:  Acute respiratory failure with hypoxia (HCC)  Confusion  Dyspnea, unspecified type    Rx / DC Orders ED Discharge Orders     None         Regan Lemming, MD 06/10/21 2259

## 2021-06-10 NOTE — Progress Notes (Signed)
Per Dr. Barbie Banner order. Contacted [facility]. Made them aware that we have sent patient  to [facility] ER for SOB and severe L arm pain.

## 2021-06-10 NOTE — ED Triage Notes (Signed)
Patient seen at PCP and sats were 84% confusion low grade fever, weak shaking.  Sent to ER

## 2021-06-10 NOTE — ED Notes (Signed)
Pt transported to radiology.

## 2021-06-10 NOTE — Progress Notes (Signed)
   Subjective:    Patient ID: Rachael [name], female    DOB: 1933-12-07, 88 y.o.   MRN: [id number]  HPI Here with hr daughter for one week of pain in the left arm which has gotten dramatically worse. The pain started in the right upper arm and has now spread to involve the entire arm from the shoulder to the hand. She has had some mild swelling in the arm. No recent trauma. She has been SOB for the past few months, and she has been diagnosed with pulmonary fibrosis. She saw Marland Kitchen NP in the pulmonology clinic on 06-04-21 and she was started on Symbicort BID in addition to the low dsoe Prednisone she has been taking. However she has become much more SOB than usual the past 24 hours. Then this morning the left arm pain became quite severe, so they came in today. She denies any chest pain. No nausea or sweats.    Review of Systems  Constitutional:  Negative for chills and diaphoresis.  HENT: Negative.    Eyes: Negative.   Respiratory:  Positive for shortness of breath. Negative for cough and wheezing.   Cardiovascular:  Negative for chest pain, palpitations and leg swelling.  Gastrointestinal: Negative.   Genitourinary: Negative.   Musculoskeletal:  Positive for myalgias.  Neurological: Negative.       Objective:   Physical Exam Constitutional:      Comments: In a wheelchair. She is shaking and in obvious distress. She is splinting the left arm to her body.   Cardiovascular:     Rate and Rhythm: Normal rate and regular rhythm.     Pulses: Normal pulses.     Heart sounds: Normal heart sounds.  Pulmonary:     Effort: Pulmonary effort is normal.     Breath sounds: Normal breath sounds.     Comments: Her oxygen sat on arrival on RA was 82%. On 3 liters of oxygen it came up to 95%.  Musculoskeletal:     Comments: She is very tender along the entire length of the left arm from the shoulder to the hand. There is mild swelling. No cords are felt. She was resistant to [place] moving the left  arm at all.   Neurological:     General: No focal deficit present.     Mental Status: She is alert and oriented to person, place, and time.          Assessment & Plan:  This is a patient with known pulmonary fibrosis and mild SOB on exertion who now is much more SOB than usual and is hypoxic. Combined with her left arm pain, I am concerned about the possibility of a left arm DVT and pulmonary emboli. We agreed that her daughter would transport the patient to [facility] ED immediately. We sent the oxygen tank with them.  Alysia Penna, MD

## 2021-06-11 ENCOUNTER — Encounter (HOSPITAL_COMMUNITY): Payer: Self-pay | Admitting: Internal Medicine

## 2021-06-11 ENCOUNTER — Emergency Department (HOSPITAL_COMMUNITY): Payer: Medicare HMO

## 2021-06-11 ENCOUNTER — Inpatient Hospital Stay (HOSPITAL_COMMUNITY): Payer: Medicare HMO

## 2021-06-11 DIAGNOSIS — Z79890 Hormone replacement therapy (postmenopausal): Secondary | ICD-10-CM | POA: Diagnosis not present

## 2021-06-11 DIAGNOSIS — M549 Dorsalgia, unspecified: Secondary | ICD-10-CM | POA: Diagnosis present

## 2021-06-11 DIAGNOSIS — I872 Venous insufficiency (chronic) (peripheral): Secondary | ICD-10-CM | POA: Diagnosis present

## 2021-06-11 DIAGNOSIS — J84112 Idiopathic pulmonary fibrosis: Secondary | ICD-10-CM | POA: Diagnosis not present

## 2021-06-11 DIAGNOSIS — N1831 Chronic kidney disease, stage 3a: Secondary | ICD-10-CM | POA: Diagnosis not present

## 2021-06-11 DIAGNOSIS — E669 Obesity, unspecified: Secondary | ICD-10-CM | POA: Diagnosis not present

## 2021-06-11 DIAGNOSIS — M79602 Pain in left arm: Secondary | ICD-10-CM

## 2021-06-11 DIAGNOSIS — I130 Hypertensive heart and chronic kidney disease with heart failure and stage 1 through stage 4 chronic kidney disease, or unspecified chronic kidney disease: Secondary | ICD-10-CM | POA: Diagnosis not present

## 2021-06-11 DIAGNOSIS — R778 Other specified abnormalities of plasma proteins: Secondary | ICD-10-CM

## 2021-06-11 DIAGNOSIS — I10 Essential (primary) hypertension: Secondary | ICD-10-CM | POA: Diagnosis not present

## 2021-06-11 DIAGNOSIS — D72829 Elevated white blood cell count, unspecified: Secondary | ICD-10-CM

## 2021-06-11 DIAGNOSIS — I5033 Acute on chronic diastolic (congestive) heart failure: Secondary | ICD-10-CM

## 2021-06-11 DIAGNOSIS — E559 Vitamin D deficiency, unspecified: Secondary | ICD-10-CM | POA: Diagnosis not present

## 2021-06-11 DIAGNOSIS — J9601 Acute respiratory failure with hypoxia: Secondary | ICD-10-CM | POA: Diagnosis not present

## 2021-06-11 DIAGNOSIS — Z7951 Long term (current) use of inhaled steroids: Secondary | ICD-10-CM | POA: Diagnosis not present

## 2021-06-11 DIAGNOSIS — E039 Hypothyroidism, unspecified: Secondary | ICD-10-CM

## 2021-06-11 DIAGNOSIS — Z8616 Personal history of COVID-19: Secondary | ICD-10-CM | POA: Diagnosis not present

## 2021-06-11 DIAGNOSIS — Z888 Allergy status to other drugs, medicaments and biological substances status: Secondary | ICD-10-CM | POA: Diagnosis not present

## 2021-06-11 DIAGNOSIS — Z853 Personal history of malignant neoplasm of breast: Secondary | ICD-10-CM | POA: Diagnosis not present

## 2021-06-11 DIAGNOSIS — G8929 Other chronic pain: Secondary | ICD-10-CM | POA: Diagnosis present

## 2021-06-11 DIAGNOSIS — Z7982 Long term (current) use of aspirin: Secondary | ICD-10-CM | POA: Diagnosis not present

## 2021-06-11 DIAGNOSIS — Q2112 Patent foramen ovale: Secondary | ICD-10-CM | POA: Diagnosis not present

## 2021-06-11 DIAGNOSIS — I739 Peripheral vascular disease, unspecified: Secondary | ICD-10-CM | POA: Diagnosis not present

## 2021-06-11 DIAGNOSIS — J849 Interstitial pulmonary disease, unspecified: Secondary | ICD-10-CM

## 2021-06-11 DIAGNOSIS — R410 Disorientation, unspecified: Secondary | ICD-10-CM | POA: Diagnosis present

## 2021-06-11 DIAGNOSIS — Z79899 Other long term (current) drug therapy: Secondary | ICD-10-CM | POA: Diagnosis not present

## 2021-06-11 DIAGNOSIS — F419 Anxiety disorder, unspecified: Secondary | ICD-10-CM | POA: Diagnosis not present

## 2021-06-11 DIAGNOSIS — E785 Hyperlipidemia, unspecified: Secondary | ICD-10-CM | POA: Diagnosis present

## 2021-06-11 DIAGNOSIS — Z96643 Presence of artificial hip joint, bilateral: Secondary | ICD-10-CM | POA: Diagnosis present

## 2021-06-11 LAB — CBC WITH DIFFERENTIAL/PLATELET
Abs Immature Granulocytes: 0.06 10*3/uL (ref 0.00–0.07)
Basophils Absolute: 0 10*3/uL (ref 0.0–0.1)
Basophils Relative: 0 %
Eosinophils Absolute: 0 10*3/uL (ref 0.0–0.5)
Eosinophils Relative: 0 %
HCT: 40.8 % (ref 36.0–46.0)
Hemoglobin: 13.6 g/dL (ref 12.0–15.0)
Immature Granulocytes: 1 %
Lymphocytes Relative: 15 %
Lymphs Abs: 1.7 10*3/uL (ref 0.7–4.0)
MCH: 29.5 pg (ref 26.0–34.0)
MCHC: 33.3 g/dL (ref 30.0–36.0)
MCV: 88.5 fL (ref 80.0–100.0)
Monocytes Absolute: 0.6 10*3/uL (ref 0.1–1.0)
Monocytes Relative: 5 %
Neutro Abs: 9 10*3/uL — ABNORMAL HIGH (ref 1.7–7.7)
Neutrophils Relative %: 79 %
Platelets: 378 10*3/uL (ref 150–400)
RBC: 4.61 MIL/uL (ref 3.87–5.11)
RDW: 14.5 % (ref 11.5–15.5)
WBC: 11.3 10*3/uL — ABNORMAL HIGH (ref 4.0–10.5)
nRBC: 0 % (ref 0.0–0.2)

## 2021-06-11 LAB — RESPIRATORY PANEL BY PCR

## 2021-06-11 LAB — URINALYSIS, ROUTINE W REFLEX MICROSCOPIC
Bacteria, UA: NONE SEEN
Bilirubin Urine: NEGATIVE
Glucose, UA: 150 mg/dL — AB
Ketones, ur: 5 mg/dL — AB
Leukocytes,Ua: NEGATIVE
Nitrite: NEGATIVE
Protein, ur: NEGATIVE mg/dL
Specific Gravity, Urine: 1.019 (ref 1.005–1.030)
pH: 5 (ref 5.0–8.0)

## 2021-06-11 LAB — COMPREHENSIVE METABOLIC PANEL
ALT: 15 U/L (ref 0–44)
AST: 24 U/L (ref 15–41)
Albumin: 3 g/dL — ABNORMAL LOW (ref 3.5–5.0)
Alkaline Phosphatase: 67 U/L (ref 38–126)
Anion gap: 10 (ref 5–15)
BUN: 16 mg/dL (ref 8–23)
CO2: 21 mmol/L — ABNORMAL LOW (ref 22–32)
Calcium: 9.8 mg/dL (ref 8.9–10.3)
Chloride: 108 mmol/L (ref 98–111)
Creatinine, Ser: 1.01 mg/dL — ABNORMAL HIGH (ref 0.44–1.00)
GFR, Estimated: 55 mL/min — ABNORMAL LOW (ref >60)
Glucose, Bld: 158 mg/dL — ABNORMAL HIGH (ref 70–99)
Potassium: 3.9 mmol/L (ref 3.5–5.1)
Sodium: 139 mmol/L (ref 135–145)
Total Bilirubin: 1.1 mg/dL (ref 0.3–1.2)
Total Protein: 6.3 g/dL — ABNORMAL LOW (ref 6.5–8.1)

## 2021-06-11 LAB — I-STAT VENOUS BLOOD GAS, ED
Acid-base deficit: 2 mmol/L (ref 0.0–2.0)
Bicarbonate: 22.9 mmol/L (ref 20.0–28.0)
Calcium, Ion: 1.29 mmol/L (ref 1.15–1.40)
HCT: 34 % — ABNORMAL LOW (ref 36.0–46.0)
Hemoglobin: 11.6 g/dL — ABNORMAL LOW (ref 12.0–15.0)
O2 Saturation: 99 %
Potassium: 3.9 mmol/L (ref 3.5–5.1)
Sodium: 137 mmol/L (ref 135–145)
TCO2: 24 mmol/L (ref 22–32)
pCO2, Ven: 37.5 mmHg — ABNORMAL LOW (ref 44–60)
pH, Ven: 7.393 (ref 7.25–7.43)
pO2, Ven: 148 mmHg — ABNORMAL HIGH (ref 32–45)

## 2021-06-11 LAB — PROCALCITONIN: Procalcitonin: 0.41 ng/mL

## 2021-06-11 LAB — GLUCOSE, CAPILLARY: Glucose-Capillary: 142 mg/dL — ABNORMAL HIGH (ref 70–99)

## 2021-06-11 LAB — D-DIMER, QUANTITATIVE: D-Dimer, Quant: 1.41 ug/mL-FEU — ABNORMAL HIGH (ref 0.00–0.50)

## 2021-06-11 LAB — MAGNESIUM: Magnesium: 1.8 mg/dL (ref 1.7–2.4)

## 2021-06-11 MED ORDER — SODIUM CHLORIDE 0.9 % IV SOLN
1.0000 g | INTRAVENOUS | Status: DC
  Administered 2021-06-11: 1 g via INTRAVENOUS
  Filled 2021-06-11: qty 10

## 2021-06-11 MED ORDER — DAPAGLIFLOZIN PROPANEDIOL 10 MG PO TABS
10.0000 mg | ORAL_TABLET | Freq: Every day | ORAL | Status: DC
  Administered 2021-06-12: 10 mg via ORAL
  Filled 2021-06-11: qty 1

## 2021-06-11 MED ORDER — ONDANSETRON HCL 4 MG PO TABS: 4.0000 mg | ORAL_TABLET | Freq: Four times a day (QID) | ORAL | Status: DC | PRN

## 2021-06-11 MED ORDER — SODIUM CHLORIDE 0.9 % IV SOLN
500.0000 mg | INTRAVENOUS | Status: DC
  Administered 2021-06-11: 500 mg via INTRAVENOUS
  Filled 2021-06-11: qty 5

## 2021-06-11 MED ORDER — ACETAMINOPHEN 325 MG PO TABS: 650.0000 mg | ORAL_TABLET | Freq: Four times a day (QID) | ORAL | Status: DC | PRN

## 2021-06-11 MED ORDER — IPRATROPIUM-ALBUTEROL 0.5-2.5 (3) MG/3ML IN SOLN
3.0000 mL | Freq: Two times a day (BID) | RESPIRATORY_TRACT | Status: DC
  Administered 2021-06-11 – 2021-06-12 (×2): 3 mL via RESPIRATORY_TRACT
  Filled 2021-06-11 (×2): qty 3

## 2021-06-11 MED ORDER — FUROSEMIDE 10 MG/ML IJ SOLN
20.0000 mg | Freq: Two times a day (BID) | INTRAMUSCULAR | Status: DC
  Administered 2021-06-11 – 2021-06-12 (×2): 20 mg via INTRAVENOUS
  Filled 2021-06-11 (×2): qty 2

## 2021-06-11 MED ORDER — IOHEXOL 350 MG/ML SOLN
80.0000 mL | Freq: Once | INTRAVENOUS | Status: AC | PRN
  Administered 2021-06-11: 80 mL via INTRAVENOUS

## 2021-06-11 MED ORDER — ONDANSETRON HCL 4 MG/2ML IJ SOLN: 4.0000 mg | Freq: Four times a day (QID) | INTRAMUSCULAR | Status: DC | PRN

## 2021-06-11 MED ORDER — LEVOTHYROXINE SODIUM 112 MCG PO TABS
112.0000 ug | ORAL_TABLET | Freq: Every day | ORAL | Status: DC
  Administered 2021-06-11 – 2021-06-12 (×2): 112 ug via ORAL
  Filled 2021-06-11 (×2): qty 1

## 2021-06-11 MED ORDER — ACETAMINOPHEN 650 MG RE SUPP: 650.0000 mg | Freq: Four times a day (QID) | RECTAL | Status: DC | PRN

## 2021-06-11 MED ORDER — POLYETHYLENE GLYCOL 3350 17 G PO PACK: 17.0000 g | PACK | Freq: Every day | ORAL | Status: DC | PRN

## 2021-06-11 MED ORDER — IPRATROPIUM-ALBUTEROL 0.5-2.5 (3) MG/3ML IN SOLN: 3.0000 mL | Freq: Four times a day (QID) | RESPIRATORY_TRACT | Status: DC

## 2021-06-11 MED ORDER — ATORVASTATIN CALCIUM 80 MG PO TABS
80.0000 mg | ORAL_TABLET | Freq: Every day | ORAL | Status: DC
  Administered 2021-06-11 – 2021-06-12 (×2): 80 mg via ORAL
  Filled 2021-06-11 (×2): qty 1

## 2021-06-11 MED ORDER — ASPIRIN 81 MG PO TBEC
81.0000 mg | DELAYED_RELEASE_TABLET | Freq: Every day | ORAL | Status: DC
  Administered 2021-06-11 – 2021-06-12 (×2): 81 mg via ORAL
  Filled 2021-06-11 (×2): qty 1

## 2021-06-11 MED ORDER — FUROSEMIDE 20 MG PO TABS
20.0000 mg | ORAL_TABLET | Freq: Every day | ORAL | Status: DC
  Filled 2021-06-11: qty 1

## 2021-06-11 MED ORDER — FUROSEMIDE 10 MG/ML IJ SOLN
20.0000 mg | Freq: Once | INTRAMUSCULAR | Status: AC
  Administered 2021-06-11: 20 mg via INTRAVENOUS
  Filled 2021-06-11: qty 2

## 2021-06-11 MED ORDER — HYDRALAZINE HCL 20 MG/ML IJ SOLN: 10.0000 mg | Freq: Four times a day (QID) | INTRAMUSCULAR | Status: DC | PRN

## 2021-06-11 MED ORDER — ENOXAPARIN SODIUM 40 MG/0.4ML IJ SOSY
40.0000 mg | PREFILLED_SYRINGE | INTRAMUSCULAR | Status: DC
  Administered 2021-06-11 – 2021-06-12 (×2): 40 mg via SUBCUTANEOUS
  Filled 2021-06-11 (×2): qty 0.4

## 2021-06-11 MED ORDER — FLUTICASONE FUROATE-VILANTEROL 100-25 MCG/ACT IN AEPB
1.0000 | INHALATION_SPRAY | Freq: Every day | RESPIRATORY_TRACT | Status: DC
  Administered 2021-06-12: 1 via RESPIRATORY_TRACT
  Filled 2021-06-11: qty 28

## 2021-06-11 MED ORDER — PREDNISONE 5 MG PO TABS
5.0000 mg | ORAL_TABLET | Freq: Every day | ORAL | Status: DC
  Administered 2021-06-11 – 2021-06-12 (×2): 5 mg via ORAL
  Filled 2021-06-11 (×2): qty 1

## 2021-06-11 MED ORDER — IPRATROPIUM-ALBUTEROL 0.5-2.5 (3) MG/3ML IN SOLN: 3.0000 mL | RESPIRATORY_TRACT | Status: DC | PRN

## 2021-06-11 NOTE — ED Notes (Signed)
Pt assisted to [facility]

## 2021-06-11 NOTE — Hospital Course (Addendum)
  88 year old female with recent diagnosis of pulmonary fibrosis visit on 5/25,pulm HTN?,  Chronic diastolic CHF EF 60 to 35%, G1 DD, PFO, CKD stage III and hypothyroidism, hypertension, history of breast cancer, anxiety disorder, HLD presents with shortness of breath and hypoxia 83% on room air at PCP office on 5/31 and also febrile 100.2.  Patient was also C/O Left arm pain.  Not requiring O2 at baseline but now requiring 3lpm here.  CTA chest shows no dissection or PE or new infiltrate,Troponins slightly elevated 41, 57.  ECG no change.  Patient admitted for hypoxia being treated for empiric pneumonia, [place] ordered of LUE to R/O DVT

## 2021-06-11 NOTE — Progress Notes (Signed)
Patient seen and examined personally, I reviewed the chart, history and physical and admission note, done by admitting physician this morning and agree with the same with following addendum.  Please refer to the morning admission note for more detailed plan of care.  Briefly,   88 year old female with recent diagnosis of pulmonary fibrosis visit on 5/25,pulm HTN?,  Chronic diastolic CHF EF 60 to 28%, G1 DD, PFO, CKD stage III and hypothyroidism, hypertension, history of breast cancer, anxiety disorder, HLD presents with shortness of breath and hypoxia 83% on room air at PCP office on 5/31 and also febrile 100.2.  Patient was also C/O Left arm pain.  Not requiring O2 at baseline but now requiring 3lpm here.  CTA chest shows no dissection or PE or new infiltrate,Troponins slightly elevated 41, 57.  ECG no change.  Patient admitted for hypoxia being treated for empiric pneumonia, [place] ordered of LUE to R/O DVT   On my exam patient reports feels much better on oxygen.  Denies chest pain. Lungs diminished bilaterally  A/P  Acute respiratory failure with hypoxia ILD: CTA no acute finding.  VQ scan / duplex LE pending.  Continue bronchodilators, inhalers, empiric Lasix.  Continue home prednisone 5 mg.  We will consult pulmonary Check procalcitonin to rule out infectious etiology, check RVP.  Continue supplemental oxygen to maintain saturation at least 93% or above  Elevated troponin level not due myocardial infarction: Suspect demand ischemia due to hypoxia.  No chest pain.  Troponin flat Leukocytosis: Mild. Chronic diastolic CHF: No obvious CHF or fluid overload BNP slightly elevated from baseline.  Trial dose of Lasix monitor intake output Daily weight Chronic kidney disease, stage 3a : At baseline Essential hypertension: Not on meds at home.  Monitor Hypothyroidism-continue home Synthroid

## 2021-06-11 NOTE — Discharge Summary (Signed)
Physician Discharge Summary  Rachael [name] PNT:[id number] DOB: 1933-04-28 DOA: 06/10/2021  PCP: [name], MD  Admit date: 06/10/2021 Discharge date: 06/12/2021 Recommendations for Outpatient Follow-up:  Follow up with PCP in 1 weeks-call for appointment Please obtain BMP/CBC in one week  Discharge Dispo: Home Discharge Condition: Stable Code Status:   Code Status: Full Code Diet recommendation:  Diet Order             Diet Heart Room service appropriate? Yes; Fluid consistency: Thin  Diet effective now                   Brief/Interim Summary: 88 year old female with recent diagnosis of pulmonary fibrosis visit on 5/25,pulm HTN?,  Chronic diastolic CHF EF 60 to 08%, G1 DD, PFO, CKD stage III and hypothyroidism, hypertension, history of breast cancer, anxiety disorder, HLD presents with shortness of breath and hypoxia 83% on room air at PCP office on 5/31 and also febrile 100.2.  Patient was also C/O Left arm pain.  Not requiring O2 at baseline but now requiring 3lpm here.  CTA chest shows no dissection or PE or new infiltrate,Troponins slightly elevated 41, 57.  ECG no change.  Patient admitted for hypoxia being treated for empiric pneumonia, [place] ordered of LUE to R/O DVT  duplex negative for DVT.  Seen by pulmonary, treated with IV antibiotics and IV Lasix, at this time patient is clinically improved but will need to go home on oxygen with monitoring of saturation with pulse ox at home.  Discussed with  PCCM this morning okay for discharge home.  Discharge Diagnoses:  Principal Problem:   Acute respiratory failure with hypoxia (HCC) Active Problems:   ILD (interstitial lung disease) (HCC)   Acute on chronic diastolic CHF (congestive heart failure) (HCC)   Elevated troponin level not due myocardial infarction   Leukocytosis   Chronic kidney disease, stage 3a ([place])   Essential hypertension   Hypothyroidism  Acute respiratory failure with hypoxia ILD: CTA no acute finding.VQ  scan pending. Duplex LE NEG.  Continue bronchodilators, inhalers, empiric Lasix.  Continue home prednisone 5 mg.  seen by PCCM. Checked procalcitonin- at 0.4. RVP neg.  Continue supplemental oxygen to maintain saturation at least 93% or above. Will need hoem o2.  S/p iv lasix. We will continue empiric antibiotics.   Elevated troponin level not due myocardial infarction: Suspect demand ischemia due to hypoxia. No chest pain.  Troponin flat  Leukocytosis: Mild.cont antibiotics as 1 Chronic diastolic CHF:No obvious CHF or fluid overload BNP slightly elevated from baseline. Trial dose of Lasix down to help with hypoxia.  Follow-up with outpatient PCP/cardiology  Chronic kidney disease, stage 3a : creat at baseline Essential hypertension:Not on meds at home.Monitor Hypothyroidism-continue home Synthroid  Consults: PCCM  Subjective: Alert awake oriented resting comfortably.  Reports shortness of breath is much improved today and would like to go home today. Has been anxious to go home since yesterday evening.   Discharge Exam: Vitals:   06/12/21 0725 06/12/21 1000  BP: (!) 108/53 128/61  Pulse: (!) 55 (!) 53  Resp: 17 16  Temp: (!) 97.4 F (36.3 C) 97.8 F (36.6 C)  SpO2: 99% 100%   General: Pt is alert, awake, not in acute distress Cardiovascular: RRR, S1/S2 +, no rubs, no gallops Respiratory: CTA bilaterally, no wheezing, no rhonchi Abdominal: Soft, NT, ND, bowel sounds + Extremities: no edema, no cyanosis  Discharge Instructions  Discharge Instructions     Discharge instructions   Complete by:  As directed    Please call call MD or return to ER for similar or worsening recurring problem that brought you to hospital or if any fever,nausea/vomiting,abdominal pain, uncontrolled pain, chest pain,  shortness of breath or any other alarming symptoms. Check your oxygen at home with pulse oximeter anytime you have shortness of breath cough and check it at least 3 times a day  regularly Follow-up with your pulmonary doctor.  Please follow-up your doctor as instructed in a week time and call the office for appointment.  Please avoid alcohol, smoking, or any other illicit substance and maintain healthy habits including taking your regular medications as prescribed.  You were cared for by a hospitalist during your hospital stay. If you have any questions about your discharge medications or the care you received while you were in the hospital after you are discharged, you can call the unit and ask to speak with the hospitalist on call if the hospitalist that took care of you is not available.  Once you are discharged, your primary care physician will handle any further medical issues. Please note that NO REFILLS for any discharge medications will be authorized once you are discharged, as it is imperative that you return to your primary care physician (or establish a relationship with a primary care physician if you do not have one) for your aftercare needs so that they can reassess your need for medications and monitor your lab values   Increase activity slowly   Complete by: As directed       Allergies as of 06/12/2021       Reactions   Prednisone Other (See Comments)   Muscle weakness  Caused severe numbness and weakness of  extremity   Shellfish Allergy Anaphylaxis, Hives   Hives all over the body. Hives all over the body   Percocet [oxycodone-acetaminophen] Hives   Iodinated Contrast Media Other (See Comments)   PT IS NOT AWARE OF IODINE ALLERGY, PREMEDICATED FOR PRIOR PREMEDS ONLY//A.C. - unknown reaction   Iodine Hives        Medication List     TAKE these medications    albuterol 108 (90 Base) MCG/ACT inhaler Commonly known as: VENTOLIN HFA Inhale 2 puffs into the lungs every 4 (four) hours as needed for wheezing or shortness of breath.   aspirin EC 81 MG tablet Take one every other day What changed:  how much to take how to take this when to  take this additional instructions   atorvastatin 80 MG tablet Commonly known as: LIPITOR Take 1 tablet (80 mg total) by mouth daily.   azithromycin 500 MG tablet Commonly known as: Zithromax Take 1 tablet (500 mg total) by mouth daily for 2 days. Take 1 tablet daily for 2 days.   B-12 PO Take 1 tablet by mouth daily.   budesonide-formoterol 80-4.5 MCG/ACT inhaler Commonly known as: SYMBICORT Inhale 2 puffs into the lungs 2 (two) times daily.   cefadroxil 500 MG capsule Commonly known as: DURICEF Take 1 capsule (500 mg total) by mouth 2 (two) times daily for 5 days.   dapagliflozin propanediol 10 MG Tabs tablet Commonly known as: FARXIGA Take 10 mg by mouth daily.   furosemide 20 MG tablet Commonly known as: LASIX Take one tablet once a day What changed:  how much to take how to take this when to take this additional instructions   levothyroxine 112 MCG tablet Commonly known as: SYNTHROID Take 1 tablet (112 mcg total) by mouth daily before breakfast.  LORazepam 0.5 MG tablet Commonly known as: ATIVAN Take 1 tablet (0.5 mg total) by mouth daily as needed for anxiety (to relax breathing).   OVER THE COUNTER MEDICATION Take 1 Dose by mouth daily. Juice Plus Berry Blend   OVER THE COUNTER MEDICATION Take 1 Dose by mouth daily. Juice Plus Omega Blend   OVER THE COUNTER MEDICATION Take 1 Dose by mouth daily. Juice Plus Vegetable Blend   OVER THE COUNTER MEDICATION Take 1 Dose by mouth daily. Juice Plus Fruit Blend   predniSONE 5 MG tablet Commonly known as: DELTASONE Take 1 tablet (5 mg total) by mouth daily with breakfast.   VITAMIN D3 PO Take 1 capsule by mouth daily.               Durable Medical Equipment  (From admission, onward)           Start     Ordered   06/11/21 1606  For home use only DME oxygen  Once       Comments: Home o2 on ambulation  Question Answer Comment  Length of Need Lifetime   Mode or (Route) Nasal cannula   Liters  per Minute 4   Oxygen delivery system Gas      06/11/21 1605            Follow-up Information     [name], MD Follow up in 1 week(s).   Specialty: Family Medicine Contact information: [place] [address] [phone]                Allergies  Allergen Reactions   Prednisone Other (See Comments)    Muscle weakness  Caused severe numbness and weakness of  extremity   Shellfish Allergy Anaphylaxis and Hives    Hives all over the body. Hives all over the body   Percocet [Oxycodone-Acetaminophen] Hives   Iodinated Contrast Media Other (See Comments)    PT IS NOT AWARE OF IODINE ALLERGY, PREMEDICATED FOR PRIOR PREMEDS ONLY//A.C. - unknown reaction   Iodine Hives    The results of significant diagnostics from this hospitalization (including imaging, microbiology, ancillary and laboratory) are listed below for reference.    Microbiology: Recent Results (from the past 240 hour(s))  Culture, blood (Routine x 2)     Status: None (Preliminary result)   Collection Time: 06/10/21  5:41 PM   Specimen: BLOOD  Result Value Ref Range Status   Specimen Description BLOOD RIGHT ANTECUBITAL  Final   Special Requests   Final    BOTTLES DRAWN AEROBIC AND ANAEROBIC Blood Culture results may not be optimal due to an excessive volume of blood received in culture bottles   Culture   Final    NO GROWTH < 24 HOURS Performed at [facility], [place] [address], [place], [place] [address]    Report Status PENDING  Incomplete  Culture, blood (Routine x 2)     Status: None (Preliminary result)   Collection Time: 06/10/21  6:42 PM   Specimen: BLOOD  Result Value Ref Range Status   Specimen Description BLOOD RIGHT ANTECUBITAL  Final   Special Requests   Final    BOTTLES DRAWN AEROBIC AND ANAEROBIC Blood Culture results may not be optimal due to an inadequate volume of blood received in culture bottles   Culture   Final    NO GROWTH < 24 HOURS Performed  at [facility], [place] [address] [place], [place] [address]    Report  Status PENDING  Incomplete  Resp Panel by RT-PCR (Flu A&B, Covid) Anterior Nasal Swab     Status: None   Collection Time: 06/10/21  8:50 PM   Specimen: Anterior Nasal Swab  Result Value Ref Range Status   SARS Coronavirus 2 by RT PCR NEGATIVE NEGATIVE Final    Comment: (NOTE) SARS-CoV-2 target nucleic acids are NOT DETECTED.  The SARS-CoV-2 RNA is generally detectable in upper respiratory specimens during the acute phase of infection. The lowest concentration of SARS-CoV-2 viral copies this assay can detect is 138 copies/mL. A negative result does not preclude SARS-Cov-2 infection and should not be used as the sole basis for treatment or other patient management decisions. A negative result may occur with  improper specimen collection/handling, submission of specimen other than nasopharyngeal swab, presence of viral mutation(s) within the areas targeted by this assay, and inadequate number of viral copies(<138 copies/mL). A negative result must be combined with clinical observations, patient history, and epidemiological information. The expected result is Negative.  Fact Sheet for Patients:  EntrepreneurPulse.com.au  Fact Sheet for Healthcare Providers:  IncredibleEmployment.be  This test is no t yet approved or cleared by the [place] FDA and  has been authorized for detection and/or diagnosis of SARS-CoV-2 by FDA under an Emergency Use Authorization (EUA). This EUA will remain  in effect (meaning this test can be used) for the duration of the COVID-19 declaration under Section 564(b)(1) of the Act, 21 U.S.C.section 360bbb-3(b)(1), unless the authorization is terminated  or revoked sooner.       Influenza A by PCR NEGATIVE NEGATIVE Final   Influenza B by PCR NEGATIVE NEGATIVE Final    Comment: (NOTE) The Xpert Xpress SARS-CoV-2/FLU/RSV plus assay is intended  as an aid in the diagnosis of influenza from Nasopharyngeal swab specimens and should not be used as a sole basis for treatment. Nasal washings and aspirates are unacceptable for Xpert Xpress SARS-CoV-2/FLU/RSV testing.  Fact Sheet for Patients: EntrepreneurPulse.com.au  Fact Sheet for Healthcare Providers: IncredibleEmployment.be  This test is not yet approved or cleared by the [place] FDA and has been authorized for detection and/or diagnosis of SARS-CoV-2 by FDA under an Emergency Use Authorization (EUA). This EUA will remain in effect (meaning this test can be used) for the duration of the COVID-19 declaration under Section 564(b)(1) of the Act, 21 U.S.C. section 360bbb-3(b)(1), unless the authorization is terminated or revoked.  Performed at [facility], [place] [address] [place], [place] [address]   Respiratory (~20 pathogens) panel by PCR     Status: None   Collection Time: 06/11/21  7:41 AM   Specimen: Nasopharyngeal Swab; Respiratory  Result Value Ref Range Status   Adenovirus NOT DETECTED NOT DETECTED Final   Coronavirus 229E NOT DETECTED NOT DETECTED Final    Comment: (NOTE) The Coronavirus on the Respiratory Panel, DOES NOT test for the novel  Coronavirus (2019 nCoV)    Coronavirus HKU1 NOT DETECTED NOT DETECTED Final   Coronavirus NL63 NOT DETECTED NOT DETECTED Final   Coronavirus OC43 NOT DETECTED NOT DETECTED Final   Metapneumovirus NOT DETECTED NOT DETECTED Final   Rhinovirus / Enterovirus NOT DETECTED NOT DETECTED Final   Influenza A NOT DETECTED NOT DETECTED Final   Influenza B NOT DETECTED NOT DETECTED Final   Parainfluenza Virus 1 NOT DETECTED NOT DETECTED Final   Parainfluenza Virus 2 NOT DETECTED NOT DETECTED Final   Parainfluenza Virus 3 NOT DETECTED NOT DETECTED Final   Parainfluenza Virus 4 NOT DETECTED NOT DETECTED  Final   Respiratory Syncytial Virus NOT DETECTED NOT DETECTED Final   Bordetella  pertussis NOT DETECTED NOT DETECTED Final   Bordetella Parapertussis NOT DETECTED NOT DETECTED Final   Chlamydophila pneumoniae NOT DETECTED NOT DETECTED Final   Mycoplasma pneumoniae NOT DETECTED NOT DETECTED Final    Comment: Performed at [facility], [place] [address], [place], [place] [address]    Procedures/Studies: DG Chest 2 View  Result Date: 06/10/2021 CLINICAL DATA:  Sepsis EXAM: CHEST - 2 VIEW COMPARISON:  05/20/2021 FINDINGS: Frontal and lateral views of the chest demonstrates stable enlargement of the cardiac silhouette. No acute airspace disease, effusion, or pneumothorax. Chronic interstitial scarring. No acute bony abnormalities. IMPRESSION: 1. Stable enlarged cardiac silhouette.  No acute airspace disease. Electronically Signed   By: Randa Ngo M.D.   On: 06/10/2021 18:22   DG Chest 2 View  Result Date: 05/20/2021 CLINICAL DATA:  Shortness of breath, intermittent since October. EXAM: CHEST - 2 VIEW COMPARISON:  04/09/2021 FINDINGS: Heart size upper limits of normal. Aortic atherosclerosis. Emphysema and pulmonary scarring. No sign of consolidation, collapse or measurable effusion. No acute bone finding. IMPRESSION: Chronic lung disease. Borderline cardiomegaly. Aortic atherosclerosis. No acute process identified. Electronically Signed   By: Nelson Chimes M.D.   On: 05/20/2021 14:25   CT HEAD WO CONTRAST (5MM)  Result Date: 06/11/2021 CLINICAL DATA:  Mental status changes with unknown cause. EXAM: CT HEAD WITHOUT CONTRAST TECHNIQUE: Contiguous axial images were obtained from the base of the skull through the vertex without intravenous contrast. RADIATION DOSE REDUCTION: This exam was performed according to the departmental dose-optimization program which includes automated exposure control, adjustment of the mA and/or kV according to patient size and/or use of iterative reconstruction technique. COMPARISON:  Head CT 02/24/2018, MR head 02/24/2018 FINDINGS: Brain: There is mild  cerebral atrophy, small-vessel disease and atrophic ventriculomegaly with unremarkable cerebellum and brainstem. A small chronic left parietal cortical infarct is again seen. No focal asymmetry is noted consistent with an acute infarct, hemorrhage or mass. There is no midline shift. Basal cisterns are clear. Vascular: There are calcifications of the carotid siphons but no hyperdense vessel is seen. Skull: No calvarial fracture or focal lesion is evident. Sinuses/Orbits: No acute findings. Other: None. IMPRESSION: No acute intracranial CT findings or interval changes. Mild atrophy and small-vessel disease. Small chronic left parietal cortical infarct. Electronically Signed   By: Telford Nab M.D.   On: 06/11/2021 02:03   CT Angio Chest PE W and/or Wo Contrast  Result Date: 05/20/2021 CLINICAL DATA:  Pulmonary embolism (PE) suspected, high prob. Dyspnea EXAM: CT ANGIOGRAPHY CHEST WITH CONTRAST TECHNIQUE: Multidetector CT imaging of the chest was performed using the standard protocol during bolus administration of intravenous contrast. Multiplanar CT image reconstructions and MIPs were obtained to evaluate the vascular anatomy. RADIATION DOSE REDUCTION: This exam was performed according to the departmental dose-optimization program which includes automated exposure control, adjustment of the mA and/or kV according to patient size and/or use of iterative reconstruction technique. CONTRAST:  33m OMNIPAQUE IOHEXOL 350 MG/ML SOLN COMPARISON:  03/18/2015 FINDINGS: Cardiovascular: Extensive multi-vessel coronary artery calcification. Mild cardiomegaly. No pericardial effusion. The pulmonary arterial tree is adequately opacified. No intraluminal filling defect identified to suggest acute pulmonary embolism. The central pulmonary arteries are mildly enlarged suggesting changes of pulmonary arterial hypertension. Mild atherosclerotic calcification within the thoracic aorta. No aortic aneurysm. Aberrant origin of the  right subclavian artery noted. Wide patency of the arch vasculature proximally. Mediastinum/Nodes: Visualized thyroid unremarkable. No pathologic  thoracic adenopathy. Esophagus unremarkable. Lungs/Pleura: Progressive subpleural reticulation and sparse ground-glass pulmonary infiltrate and the costophrenic angles bilaterally, most in keeping with progressive mild subpleural pulmonary fibrosis. No pneumothorax or pleural effusion. Central airways are widely patent. Upper Abdomen: No acute abnormality. Musculoskeletal: No acute bone abnormality. Osseous structures are age-appropriate. Review of the MIP images confirms the above findings. IMPRESSION: No pulmonary embolism. Extensive multi-vessel coronary artery calcification. Mild cardiomegaly. Morphologic changes in keeping with pulmonary arterial hypertension. Mild progressive subpleural pulmonary fibrosis. Aortic Atherosclerosis (ICD10-I70.0). Electronically Signed   By: Fidela Salisbury M.D.   On: 05/20/2021 20:21   DG Shoulder Left  Result Date: 06/10/2021 CLINICAL DATA:  Left shoulder pain, decreased range of motion EXAM: LEFT SHOULDER - 2+ VIEW COMPARISON:  None Available. FINDINGS: Internal rotation, external rotation, and transscapular views of the left shoulder are obtained. There is severe glenohumeral and acromioclavicular joint osteoarthritis. Narrowing of the acromial humeral interval suggests chronic longstanding rotator cuff tear. No acute fracture, subluxation, or dislocation. Visualized portions of the left chest are clear. IMPRESSION: 1. Severe degenerative changes of the left shoulder. No acute fracture. Electronically Signed   By: Randa Ngo M.D.   On: 06/10/2021 20:00   CT Angio Chest/Abd/Pel for Dissection W and/or Wo Contrast  Result Date: 06/11/2021 CLINICAL DATA:  Left shoulder and arm pain. Acute aortic syndrome suspected. EXAM: CT ANGIOGRAPHY CHEST, ABDOMEN AND PELVIS TECHNIQUE: Initial chest CT without contrast was performed  axially. Multidetector CT imaging through the chest, abdomen and pelvis was performed using the standard protocol during bolus administration of intravenous contrast. Multiplanar reconstructed images and MIPs were obtained and reviewed to evaluate the vascular anatomy. RADIATION DOSE REDUCTION: This exam was performed according to the departmental dose-optimization program which includes automated exposure control, adjustment of the mA and/or kV according to patient size and/or use of iterative reconstruction technique. CONTRAST:  22m OMNIPAQUE IOHEXOL 350 MG/ML SOLN COMPARISON:  PA Lat chest yesterday chest x-ray 05/20/2021, CTA chest 05/20/2021, and chest, abdomen and pelvis CT with oral contrast only on 03/18/2015 FINDINGS: CTA CHEST FINDINGS Cardiovascular: There is moderate panchamber cardiomegaly with a slight left chamber predominance no pericardial fluid, and patchy three-vessel calcific CAD. There is a slightly prominent pulmonary trunk, main pulmonary arteries indicating arterial hypertension. There is no evidence of acute right heart strain. Pulmonary arteries are well opacified through the segmental level and no embolic filling defects are seen. There is mild aortic and great vessel atherosclerosis without aneurysm, dissection or penetrating ulcer with mild descending tortuosity and aberrant right subclavian artery origin again shown. The great vessels are widely patent. Mediastinum/Nodes: No adenopathy or thyroid mass is seen. There is no tracheal filling defect. There is no esophageal thickening. Lungs/Pleura: There is again noted subpleural reticulation with basal gradient and scattered areas of single-layer subpleural honeycombing in the bases, findings consistent with UIP pattern fibrosis. There is scattered peripheral ground-glass disease in the lower zones without interval improvement or worsening consistent with chronic change. No active lung infiltrate is suspected. The central airways are  clear. There is no pleural effusion thickening or pneumothorax. Musculoskeletal: There is osteopenia with degenerative changes and bridging enthesopathy of the thoracic spine. No worrisome bone lesion is seen. No focal chest wall abnormality. Review of the MIP images confirms the above findings. CTA ABDOMEN AND PELVIS FINDINGS VASCULAR Aorta: Abdominal aorta is tortuous with moderate patchy calcification without evidence of aneurysm, penetrating ulcer or dissection. Celiac: There are ostial calcifications causing a 75% origin stenosis. The remainder of the celiac artery  is clear. There is mild scattered calcification of the splenic artery. SMA: Patent without evidence of aneurysm, dissection, vasculitis or significant stenosis. There are nonstenosing ostial calcifications superiorly. Renals: Both renal arteries are duplicated. On the left, the nondominant artery arises first from the posterolateral aortic wall with ostial calcifications causing up to 60% origin stenosis. This artery feeds in the upper pole. The dominant mid/lower pole artery also has ostial calcifications and up to 75% origin stenosis. The remainder of both left renal arteries are otherwise unremarkable. On the right, the hypoplastic upper pole artery arises first and is clear. The dominant mid/lower polar also demonstrates ostial calcifications but only about 40% ostial stenosis with remainder of both vessels widely clear. IMA: Patent without evidence of aneurysm, dissection, vasculitis or significant stenosis. Inflow: There patchy calcifications in the common iliac and internal iliac arteries less patchy more scattered calcification in both external iliac arteries but no flow-limiting stenoses in the inflow vessels. Veins: The veins are unopacified and not evaluated. No hyperdense filling defects are suspected. Evaluation of the pelvic deep veins limited by metal artifact from bilateral hip replacements. Review of the MIP images confirms the above  findings. NON-VASCULAR Hepatobiliary: No focal abnormality seen in the liver. Status post cholecystectomy. No significant biliary prominence. Pancreas: Partially atrophic and otherwise unremarkable. Spleen: No focal abnormality or splenomegaly. Adrenals/Urinary Tract: There is no adrenal mass. Bilateral renal cysts are again noted largest on the left 7.5 cm in the inferior pole previously 6.3 cm in 2017. Largest on the right is in the medial upper to midpole currently 7 cm, previously 5 cm. No renal mass enhancement, calculus or hydronephrosis is seen. The ureters are unremarkable down to the level of the hip replacements which obscure both of them. There is no obvious focal bladder abnormality with bladder not well seen due to the metallic artifact. Stomach/Bowel: No dilatation or wall thickening. An appendix is not seen. There is sigmoid diverticulosis without evidence of diverticulitis. Lymphatic: No adenopathy is seen. Reproductive: The uterus is intact. The ovaries are not enlarged. The uterus is not well seen due to hip replacement artifacts. Other: There is no free air, hemorrhage or fluid. A broad-based ventral mesh hernia repair remains intact. Musculoskeletal: Hip replacements. There is osteopenia and degenerative change of the spine. There is interval new mild-to-moderate anterior wedge compression fracture of the L3 vertebral body treated with kyphoplasty, moderate wedging without kyphoplasty at L4 with slight retropulsion, and anterior bridging osteophytes between these segments extending to L5 consistent with a chronic process. A chronic ovoid 3 cm dense foreign body-like structure is seen in the subcutaneous plane dorsally overlying the right aspect of S5. There is advanced degenerative change of the lumbar spine with multilevel acquired spinal canal stenosis. Review of the MIP images confirms the above findings. IMPRESSION: 1. Aortic and coronary artery atherosclerosis without evidence of acute  aortic syndrome. 2. Aberrant retroesophageal right subclavian artery. 3. Slightly prominent pulmonary trunk, chronically noted without evidence of central embolus. 4. Cardiomegaly without evidence of acute CHF. 5. Subpleural fibrosis with a basal gradient and chronic ground-glass disease. 6. Bilaterally duplicated renal arteries with stenoses described above. 7. 75% calcific stenosis of the celiac artery origin. 8. Chronic findings in the abdomen and pelvis with no acute abnormality demonstrated. 9. Multilevel acquired lumbar spinal canal stenosis with chronic compression fractures of L3 and 4, L3 treated with kyphoplasty. This was not seen in 2017. 10. Bilateral hip replacements mostly obscuring the pelvic structures. Electronically Signed   By: Lanny Hurst  Chesser M.D.   On: 06/11/2021 01:57   VAS [place] UPPER EXTREMITY VENOUS DUPLEX  Result Date: 06/11/2021 UPPER VENOUS STUDY  Patient Name:  KIMBERLIE CSASZAR [place]  Date of Exam:   06/11/2021 Medical Rec #: [id number]      Accession #:    [id number] Date of Birth: 01-Sep-1933     Patient Gender: F Patient Age:   88 years Exam Location:  [facility] Procedure:      VAS [place] UPPER EXTREMITY VENOUS DUPLEX Referring Phys: Inda Merlin --------------------------------------------------------------------------------  Indications: Pain Risk Factors: None identified. Comparison Study: No prior studies. Performing Technologist: Oliver Hum RVT  Examination Guidelines: A complete evaluation includes B-mode imaging, spectral Doppler, color Doppler, and power Doppler as needed of all accessible portions of each vessel. Bilateral testing is considered an integral part of a complete examination. Limited examinations for reoccurring indications may be performed as noted.  Right Findings: +----------+------------+---------+-----------+----------+-------+ RIGHT     CompressiblePhasicitySpontaneousPropertiesSummary +----------+------------+---------+-----------+----------+-------+  Subclavian    Full       Yes       Yes                      +----------+------------+---------+-----------+----------+-------+  Left Findings: +----------+------------+---------+-----------+----------+-------+ LEFT      CompressiblePhasicitySpontaneousPropertiesSummary +----------+------------+---------+-----------+----------+-------+ IJV           Full       Yes       Yes                      +----------+------------+---------+-----------+----------+-------+ Subclavian    Full       Yes       Yes                      +----------+------------+---------+-----------+----------+-------+ Axillary      Full       Yes       Yes                      +----------+------------+---------+-----------+----------+-------+ Brachial      Full       Yes       Yes                      +----------+------------+---------+-----------+----------+-------+ Radial        Full                                          +----------+------------+---------+-----------+----------+-------+ Ulnar         Full                                          +----------+------------+---------+-----------+----------+-------+ Cephalic      Full                                          +----------+------------+---------+-----------+----------+-------+ Basilic       Full                                          +----------+------------+---------+-----------+----------+-------+  Summary:  Right:  No evidence of thrombosis in the subclavian.  Left: No evidence of deep vein thrombosis in the upper extremity. No evidence of superficial vein thrombosis in the upper extremity.  *See table(s) above for measurements and observations.  Diagnosing physician: Orlie Pollen Electronically signed by Orlie Pollen on 06/11/2021 at 5:22:20 PM.    Final     Labs: BNP (last 3 results) Recent Labs    12/26/20 1511 05/20/21 1405 06/10/21 2215  BNP 370* 161.2* 681.2*   Basic Metabolic Panel: Recent Labs   Lab 06/10/21 1741 06/11/21 0337 06/11/21 0719  NA 136 137 139  K 3.7 3.9 3.9  CL 104  --  108  CO2 21*  --  21*  GLUCOSE 123*  --  158*  BUN 16  --  16  CREATININE 1.12*  --  1.01*  CALCIUM 9.8  --  9.8  MG  --   --  1.8   Liver Function Tests: Recent Labs  Lab 06/10/21 1741 06/11/21 0719  AST 18 24  ALT 15 15  ALKPHOS 73 67  BILITOT 2.8* 1.1  PROT 6.7 6.3*  ALBUMIN 3.4* 3.0*   No results for input(s): LIPASE, AMYLASE in the last 168 hours. No results for input(s): AMMONIA in the last 168 hours. CBC: Recent Labs  Lab 06/10/21 1741 06/11/21 0337 06/11/21 0719  WBC 14.6*  --  11.3*  NEUTROABS 12.2*  --  9.0*  HGB 13.6 11.6* 13.6  HCT 41.7 34.0* 40.8  MCV 89.3  --  88.5  PLT 386  --  378   Cardiac Enzymes: No results for input(s): CKTOTAL, CKMB, CKMBINDEX, TROPONINI in the last 168 hours. BNP: Invalid input(s): POCBNP CBG: Recent Labs  Lab 06/11/21 1553  GLUCAP 142*   D-Dimer Recent Labs    06/11/21 0322  DDIMER 1.41*   Hgb A1c No results for input(s): HGBA1C in the last 72 hours. Lipid Profile No results for input(s): CHOL, HDL, LDLCALC, TRIG, CHOLHDL, LDLDIRECT in the last 72 hours. Thyroid function studies No results for input(s): TSH, T4TOTAL, T3FREE, THYROIDAB in the last 72 hours.  Invalid input(s): FREET3 Anemia work up No results for input(s): VITAMINB12, FOLATE, FERRITIN, TIBC, IRON, RETICCTPCT in the last 72 hours. Urinalysis    Component Value Date/Time   COLORURINE YELLOW 06/11/2021 0255   APPEARANCEUR CLEAR 06/11/2021 0255   LABSPEC 1.019 06/11/2021 0255   PHURINE 5.0 06/11/2021 0255   GLUCOSEU 150 (A) 06/11/2021 0255   GLUCOSEU NEGATIVE 08/28/2010 1548   HGBUR SMALL (A) 06/11/2021 0255   BILIRUBINUR NEGATIVE 06/11/2021 0255   KETONESUR 5 (A) 06/11/2021 0255   PROTEINUR NEGATIVE 06/11/2021 0255   UROBILINOGEN 0.2 08/28/2010 1548   NITRITE NEGATIVE 06/11/2021 0255   LEUKOCYTESUR NEGATIVE 06/11/2021 0255   Sepsis  Labs Invalid input(s): PROCALCITONIN,  WBC,  LACTICIDVEN Microbiology Recent Results (from the past 240 hour(s))  Culture, blood (Routine x 2)     Status: None (Preliminary result)   Collection Time: 06/10/21  5:41 PM   Specimen: BLOOD  Result Value Ref Range Status   Specimen Description BLOOD RIGHT ANTECUBITAL  Final   Special Requests   Final    BOTTLES DRAWN AEROBIC AND ANAEROBIC Blood Culture results may not be optimal due to an excessive volume of blood received in culture bottles   Culture   Final    NO GROWTH < 24 HOURS Performed at [facility] [address], [place], [place] [address]    Report Status PENDING  Incomplete  Culture, blood (  Routine x 2)     Status: None (Preliminary result)   Collection Time: 06/10/21  6:42 PM   Specimen: BLOOD  Result Value Ref Range Status   Specimen Description BLOOD RIGHT ANTECUBITAL  Final   Special Requests   Final    BOTTLES DRAWN AEROBIC AND ANAEROBIC Blood Culture results may not be optimal due to an inadequate volume of blood received in culture bottles   Culture   Final    NO GROWTH < 24 HOURS Performed at [facility], [address] , [place] [address]    Report Status PENDING  Incomplete  Resp Panel by RT-PCR (Flu A&B, Covid) Anterior Nasal Swab     Status: None   Collection Time: 06/10/21  8:50 PM   Specimen: Anterior Nasal Swab  Result Value Ref Range Status   SARS Coronavirus 2 by RT PCR NEGATIVE NEGATIVE Final    Comment: (NOTE) SARS-CoV-2 target nucleic acids are NOT DETECTED.  The SARS-CoV-2 RNA is generally detectable in upper respiratory specimens during the acute phase of infection. The lowest concentration of SARS-CoV-2 viral copies this assay can detect is 138 copies/mL. A negative result does not preclude SARS-Cov-2 infection and should not be used as the sole basis for treatment or other patient management decisions. A negative result may occur with  improper specimen  collection/handling, submission of specimen other than nasopharyngeal swab, presence of viral mutation(s) within the areas targeted by this assay, and inadequate number of viral copies(<138 copies/mL). A negative result must be combined with clinical observations, patient history, and epidemiological information. The expected result is Negative.  Fact Sheet for Patients:  EntrepreneurPulse.com.au  Fact Sheet for Healthcare Providers:  IncredibleEmployment.be  This test is no t yet approved or cleared by the [place] FDA and  has been authorized for detection and/or diagnosis of SARS-CoV-2 by FDA under an Emergency Use Authorization (EUA). This EUA will remain  in effect (meaning this test can be used) for the duration of the COVID-19 declaration under Section 564(b)(1) of the Act, 21 U.S.C.section 360bbb-3(b)(1), unless the authorization is terminated  or revoked sooner.       Influenza A by PCR NEGATIVE NEGATIVE Final   Influenza B by PCR NEGATIVE NEGATIVE Final    Comment: (NOTE) The Xpert Xpress SARS-CoV-2/FLU/RSV plus assay is intended as an aid in the diagnosis of influenza from Nasopharyngeal swab specimens and should not be used as a sole basis for treatment. Nasal washings and aspirates are unacceptable for Xpert Xpress SARS-CoV-2/FLU/RSV testing.  Fact Sheet for Patients: EntrepreneurPulse.com.au  Fact Sheet for Healthcare Providers: IncredibleEmployment.be  This test is not yet approved or cleared by the [place] FDA and has been authorized for detection and/or diagnosis of SARS-CoV-2 by FDA under an Emergency Use Authorization (EUA). This EUA will remain in effect (meaning this test can be used) for the duration of the COVID-19 declaration under Section 564(b)(1) of the Act, 21 U.S.C. section 360bbb-3(b)(1), unless the authorization is terminated or revoked.  Performed at [facility], [place] [address], [place], [place] [address]   Respiratory (~20 pathogens) panel by PCR     Status: None   Collection Time: 06/11/21  7:41 AM   Specimen: Nasopharyngeal Swab; Respiratory  Result Value Ref Range Status   Adenovirus NOT DETECTED NOT DETECTED Final   Coronavirus 229E NOT DETECTED NOT DETECTED Final    Comment: (NOTE) The Coronavirus on the Respiratory Panel, DOES NOT test for the novel  Coronavirus (2019 nCoV)  Coronavirus HKU1 NOT DETECTED NOT DETECTED Final   Coronavirus NL63 NOT DETECTED NOT DETECTED Final   Coronavirus OC43 NOT DETECTED NOT DETECTED Final   Metapneumovirus NOT DETECTED NOT DETECTED Final   Rhinovirus / Enterovirus NOT DETECTED NOT DETECTED Final   Influenza A NOT DETECTED NOT DETECTED Final   Influenza B NOT DETECTED NOT DETECTED Final   Parainfluenza Virus 1 NOT DETECTED NOT DETECTED Final   Parainfluenza Virus 2 NOT DETECTED NOT DETECTED Final   Parainfluenza Virus 3 NOT DETECTED NOT DETECTED Final   Parainfluenza Virus 4 NOT DETECTED NOT DETECTED Final   Respiratory Syncytial Virus NOT DETECTED NOT DETECTED Final   Bordetella pertussis NOT DETECTED NOT DETECTED Final   Bordetella Parapertussis NOT DETECTED NOT DETECTED Final   Chlamydophila pneumoniae NOT DETECTED NOT DETECTED Final   Mycoplasma pneumoniae NOT DETECTED NOT DETECTED Final    Comment: Performed at [facility], [place] [address], [place], [place] [address]     Time coordinating discharge: 25 minutes  SIGNED: Antonieta Pert, MD  [facility] 06/12/2021, 10:16 AM  If 7PM-7AM, please contact night-coverage [email or web address]

## 2021-06-11 NOTE — Progress Notes (Signed)
Left upper extremity venous duplex has been completed. Preliminary results can be found in CV Proc through chart review.  Results were given to Dr. Cyd Silence.  06/11/21 1:57 PM Rachael [name] RVT

## 2021-06-11 NOTE — ED Notes (Signed)
Pt did not receive tray this AM. Breakfast tray reordered.

## 2021-06-11 NOTE — Assessment & Plan Note (Signed)
   Mild leukocytosis likely secondary to ongoing prednisone use  CRP elevated, obtaining procalcitonin  No definitive infectious process noted on CT imaging of the chest

## 2021-06-11 NOTE — Consult Note (Signed)
NAME:  Rachael [name], MRN:  [id number], DOB:  [birth year], LOS: 0 ADMISSION DATE:  06/10/2021, CONSULTATION DATE: 06/11/2021 REFERRING MD: [facility], CHIEF COMPLAINT: Shortness of breath  History of Present Illness:  88 year old female with a plethora of health issues as documented below.  She is followed by cardiology for bradycardia.  He has a history of COVID 2021.  She established with pulmonology 12/30/2020 Dr. Annamaria Boots.  She is having approximately 10 days of increasing shortness of breath orthopnea fevers chills or sweats and has a known history of pulmonary fibrosis by CT scan.  She is a never smoker but was exposed to heavy cigarette smoking child for her father.  Pulmonary critical care asked to evaluate.  She is currently O2 dependent at 2 L nasal  Pertinent  Medical History   Past Medical History:  Diagnosis Date   Anxiety state 11/14/2007   Qualifier: Diagnosis of  By: Jenny Reichmann MD, Hunt Oris    Breast cancer [facility])    R   CAROTID STENOSIS    Chronic venous insufficiency    CONTACT DERMATITIS    DUPUYTREN'S CONTRACTURE    Edema    pt  states had edema of feet and legs has been on lasix per dr. to help   GLUCOSE INTOLERANCE    HLD (hyperlipidemia)    Hypothyroidism    Dr. Ala Dach in [place]    Obesity    Osteoarthritis    bilat hips, severe   PVD (peripheral vascular disease) ([place])    mild carotid 11/05   VITAMIN D DEFICIENCY      Significant Hospital Events: Including procedures, antibiotic start and stop dates in addition to other pertinent events   06/11/2021 pulmonary consult  Interim History / Subjective:  88 year old history of COVID and full pulmonary fibrosis with increasing shortness of breath.  Objective   Blood pressure 132/63, pulse (!) 47, temperature 100 F (37.8 C), temperature source Oral, resp. rate 13, height '5\' 7"'$  (1.702 m), weight 97.5 kg, SpO2 97 %.        Intake/Output Summary (Last 24 hours) at 06/11/2021 6063 Last data  filed at 06/11/2021 0841 Gross per 24 hour  Intake 3102.5 ml  Output 800 ml  Net 2302.5 ml   Filed Weights   06/10/21 1725  Weight: 97.5 kg    Examination: General: Elderly female who appears in no acute distress HENT: No JVD or lymphadenopathy is appreciated Lungs: Diminished in the bases no wheezes or crackles heard at this time Cardiovascular: Sinus bradycardia regular Abdomen: Soft nontender positive bowel sounds Extremities: 2-3+ edema lower extremities Neuro: Grossly intact without focal defect GU: Voids  Resolved Hospital Problem list     Assessment & Plan:  Dyspnea on exertion, orthopnea ongoing for period of 10 days in the setting of pulmonary fibrosis history of COVID-19 followed by Dr. Annamaria Boots with PFTs demonstrating mild diffusion defect and CT demonstrating pulmonary fibrosis. Agree with Lasix as she appears to be volume overloaded Currently on Solu-Medrol for episode of wheezing Currently on bronchodilators She will most likely benefit most from aggressive diuresis Oxygen as needed As tolerated   Congestive heart failure I agree with diuresis  Bradycardia which is normal for her, followed by cardiology has a known PFO Monitor  Hypothyroidism Monitor    Best Practice (right click and "Reselect all SmartList Selections" daily)   Diet/type: Regular consistency (see orders) DVT prophylaxis: LMWH GI prophylaxis: PPI Lines: N/A Foley:  N/A Code Status:  full code  Last date of multidisciplinary goals of care discussion [tbd]  Labs   CBC: Recent Labs  Lab 06/10/21 1741 06/11/21 0337 06/11/21 0719  WBC 14.6*  --  11.3*  NEUTROABS 12.2*  --  9.0*  HGB 13.6 11.6* 13.6  HCT 41.7 34.0* 40.8  MCV 89.3  --  88.5  PLT 386  --  782    Basic Metabolic Panel: Recent Labs  Lab 06/10/21 1741 06/11/21 0337 06/11/21 0719  NA 136 137 139  K 3.7 3.9 3.9  CL 104  --  108  CO2 21*  --  21*  GLUCOSE 123*  --  158*  BUN 16  --  16  CREATININE 1.12*  --   1.01*  CALCIUM 9.8  --  9.8  MG  --   --  1.8   GFR: Estimated Creatinine Clearance: 48.9 mL/min (A) (by C-G formula based on SCr of 1.01 mg/dL (H)). Recent Labs  Lab 06/10/21 1741 06/10/21 2215 06/11/21 0719  PROCALCITON  --   --  0.41  WBC 14.6*  --  11.3*  LATICACIDVEN 1.6 0.7  --     Liver Function Tests: Recent Labs  Lab 06/10/21 1741 06/11/21 0719  AST 18 24  ALT 15 15  ALKPHOS 73 67  BILITOT 2.8* 1.1  PROT 6.7 6.3*  ALBUMIN 3.4* 3.0*   No results for input(s): LIPASE, AMYLASE in the last 168 hours. No results for input(s): AMMONIA in the last 168 hours.  ABG    Component Value Date/Time   HCO3 22.9 06/11/2021 0337   TCO2 24 06/11/2021 0337   ACIDBASEDEF 2.0 06/11/2021 0337   O2SAT 99 06/11/2021 0337     Coagulation Profile: Recent Labs  Lab 06/10/21 1741  INR 1.2    Cardiac Enzymes: No results for input(s): CKTOTAL, CKMB, CKMBINDEX, TROPONINI in the last 168 hours.  HbA1C: Hgb A1c MFr Bld  Date/Time Value Ref Range Status  03/19/2021 09:25 AM 6.1 4.6 - 6.5 % Final    Comment:    Glycemic Control Guidelines for People with Diabetes:Non Diabetic:  <6%Goal of Therapy: <7%Additional Action Suggested:  >8%   10/28/2020 09:52 AM 5.8 4.6 - 6.5 % Final    Comment:    Glycemic Control Guidelines for People with Diabetes:Non Diabetic:  <6%Goal of Therapy: <7%Additional Action Suggested:  >8%     CBG: No results for input(s): GLUCAP in the last 168 hours.  Review of Systems:   10 point review of system taken, please see HPI for positives and negatives. Positive for orthopnea Positive for shortness of breath with exertion Negative for cough Negative for chest pain No recent injuries to lower extremity  Past Medical History:  She,  has a past medical history of Anxiety state (11/14/2007), Breast cancer ([place]), CAROTID STENOSIS, Chronic venous insufficiency, CONTACT DERMATITIS, DUPUYTREN'S CONTRACTURE, Edema, GLUCOSE INTOLERANCE, HLD (hyperlipidemia),  Hypothyroidism, MENOPAUSAL SYNDROME, Obesity, Osteoarthritis, PVD (peripheral vascular disease) ([place]), and VITAMIN D DEFICIENCY.   Surgical History:   Past Surgical History:  Procedure Laterality Date   2D echo  11/29/2003   normal LV size. normal EF    BACK SURGERY  12/06/2016   L3-L5 laminectomies per Dr. Maryellen Pile at [facility]  05/11/2011   right breast   BREAST LUMPECTOMY     right breast   CHOLECYSTECTOMY     FINGER SURGERY     HERNIA REPAIR     incisional hernia   INGUINAL HERNIA REPAIR     JOINT REPLACEMENT  Bilateral    Dr. Maureen Ralphs   normal caronary arteries     per pt. 2003   right rotator cuff surgery     [place]  05/2008 and 10/2008   B hip - alusio     Social History:   reports that she has never smoked. She has been exposed to tobacco smoke. She has never used smokeless tobacco. She reports that she does not drink alcohol and does not use drugs.   Family History:  Her family history includes Breast cancer (age of onset: 56) in her daughter; Cancer in her daughter and mother; Clotting disorder in her maternal grandmother; Colon cancer in her cousin; Coronary artery disease in an other family member; Diabetes in her son; Heart disease in her brother and father; Hypothyroidism in an other family member; Leukemia in her cousin; Ovarian cancer in her mother; Pancreatic cancer in her maternal grandfather; Prostate cancer in her cousin, maternal uncle, and paternal uncle; Uterine cancer in her cousin; Uterine cancer (age of onset: 51) in her mother; Varicose Veins in her father. There is no history of Anesthesia problems, Adrenal disorder, Esophageal cancer, or Stomach cancer.   Allergies Allergies  Allergen Reactions   Prednisone Other (See Comments)    Muscle weakness  Caused severe numbness and weakness of  extremity   Shellfish Allergy Anaphylaxis and Hives    Hives all over the  body. Hives all over the body   Percocet [Oxycodone-Acetaminophen] Hives   Iodinated Contrast Media Other (See Comments)    PT IS NOT AWARE OF IODINE ALLERGY, PREMEDICATED FOR PRIOR PREMEDS ONLY//A.C. - unknown reaction   Iodine Hives     Home Medications  Prior to Admission medications   Medication Sig Start Date End Date Taking? Authorizing Provider  albuterol (VENTOLIN HFA) 108 (90 Base) MCG/ACT inhaler Inhale 2 puffs into the lungs every 4 (four) hours as needed for wheezing or shortness of breath. 05/22/21   [name], MD  aspirin 81 MG EC tablet Take one every other day 02/24/21   [name], MD  atorvastatin (LIPITOR) 80 MG tablet Take 1 tablet (80 mg total) by mouth daily. 02/23/21   Early Osmond, MD  budesonide-formoterol (SYMBICORT) 80-4.5 MCG/ACT inhaler Inhale 2 puffs into the lungs 2 (two) times daily. Patient not taking: Reported on 06/10/2021 06/01/21   Jeanie Sewer, NP  Cholecalciferol (VITAMIN D3 PO) Take 250 mg by mouth 4 (four) times daily.    [provider]  Cyanocobalamin (B-12 PO) Take 1 tablet by mouth daily.     [provider]  furosemide (LASIX) 20 MG tablet Take one tablet once a day 06/05/21   [name], MD  levothyroxine (SYNTHROID) 112 MCG tablet Take 1 tablet (112 mcg total) by mouth daily before breakfast. 10/27/20   [name], MD  LORazepam (ATIVAN) 0.5 MG tablet Take 1 tablet (0.5 mg total) by mouth daily as needed for anxiety (to relax breathing). 06/01/21   Jeanie Sewer, NP  Nutritional Supplements (JUICE PLUS FIBRE PO) Juice Plus    [provider]  predniSONE (DELTASONE) 5 MG tablet Take 1 tablet (5 mg total) by mouth daily with breakfast. 06/03/21   [name], MD     Critical care time: Ferol Luz Melynda Krzywicki ACNP Acute Care Nurse Practitioner [place] Please consult Amion 06/11/2021, 9:42 AM

## 2021-06-11 NOTE — ED Notes (Signed)
[name] daughter [phone] requesting an update

## 2021-06-11 NOTE — Assessment & Plan Note (Addendum)
   Possible mild superimposed acute on chronic diastolic congestive heart failure.  BNP slightly elevated compared to baseline  Treating with course of intravenous Lasix  Strict input and output monitoring  Daily weights  Please see remainder of assessment and plan above.

## 2021-06-11 NOTE — Assessment & Plan Note (Signed)
·   Please see assessment and plan above °

## 2021-06-11 NOTE — Progress Notes (Signed)
SATURATION QUALIFICATIONS: (This note is used to comply with regulatory documentation for home oxygen)  Patient Saturations on Room Air at Rest = 96%  Patient Saturations on Room Air while Ambulating = 85%  Patient Saturations on 4 Liters of oxygen while Ambulating = 97%  Please briefly explain why patient needs home oxygen:

## 2021-06-11 NOTE — Assessment & Plan Note (Addendum)
   Patient presenting with several day history of worsening shortness of breath and hypoxia patient also complaining of left upper extremity pain and swelling.  Patient now exhibiting an oxygen requirement, currently on 3 L of oxygen via nasal cannula  Patient has recent diagnosis of interstitial lung disease, likely idiopathic pulmonary fibrosis and has recently established care with [facility] pulmonology  At this point considering patient's elevated D-dimer there is concern for a pulmonary embolism.  CT a dissection study did not confirm a PE and therefore the ER provider ordered a follow-up VQ scan which will need to be followed up on.  Furthermore a left upper extremity ultrasound to evaluate for any evidence of DVT has been ordered.  Procalcitonin ordered.  In the meantime continue to provide patient with supplemental oxygen  Continuing bronchodilator therapy  Continuing home regimen of maintenance inhalers  Additionally patient may be suffering from superimposed acute diastolic congestive heart failure.  Providing patient with a course of intravenous Lasix considering increasing BNP compared to baseline even in the absence of significant pulmonary edema on chest imaging.   While there are notations of emphysema in the patient's chart, according to the patient's last pulmonology note there is no CT evidence of emphysema and pulmonary function testing performed 12/2020 revealed normal spirometry with moderate diffusion defect

## 2021-06-11 NOTE — Assessment & Plan Note (Signed)
Strict intake and output monitoring Creatinine near baseline Minimizing nephrotoxic agents as much as possible Serial chemistries to monitor renal function and electrolytes  

## 2021-06-11 NOTE — Assessment & Plan Note (Signed)
·   Slightly elevated serial troponins with flat trajectory of elevation °· Patient is chest pain-free °· Likely secondary to underlying illness, plaque rupture is unlikely °· Monitoring patient on telemetry ° ° ° ° ° ° °

## 2021-06-11 NOTE — H&P (Addendum)
History and Physical    Patient: Rachael [name] MRN: [id number] DOA: 06/10/2021  Date of Service: the patient was seen and examined on 06/11/2021  Patient coming from:  PCP office with family  Chief Complaint:  Chief Complaint  Patient presents with   Altered Mental Status   Shortness of Breath    HPI:   88 year old female with past medical history of recent diagnosis of pulmonary fibrosis (recently seen by [facility] 6/37), diastolic congestive heart failure ( Echo 11/2020 EF 60-65% with G1DD), patent foramen ovale, chronic kidney disease stage IIIa,  hypothyroidism, hypertension, breast cancer, anxiety disorder, hyperlipidemia presenting to [facility] emergency department from her primary care provider's office with shortness of breath and arm pain.  For the past several days the patient has been experiencing shortness of breath.  Shortness of breath was initially mild in intensity but progressively has become more more severe.  Shortness of breath is worse with exertion and improved with rest.  Patient is also been complaining of left arm pain and swelling for the past several days.  Patient has not been experiencing any fevers, sick contacts, recent travel or contact with confirmed COVID-19 infection.  Due to patient's progressively worsening symptoms she presented to see her primary care provider on 5/31.  In her PCPs office patient was found to have a temperature of 100.2 F and was also found to be hypoxic with oxygen saturation 84% on room air.  Patient was therefore sent over to [facility] emergency department immediately by private vehicle with a supplemental oxygen tank.  Upon evaluation in the emergency department CT angiogram of the chest abdomen pelvis with dissection protocol was performed revealing no evidence of dissection.  The skin confirmed  subpleural fibrosis but revealed no evidence of superimposed bacterial infection or pulmonary edema.  Patient  continued to exhibit shortness of breath and hypoxia and therefore the hospitalist group was called to assess the patient for admission to the hospital.  Review of Systems: Review of Systems  Respiratory:  Positive for shortness of breath.   Musculoskeletal:        Left arm pain  All other systems reviewed and are negative.   Past Medical History:  Diagnosis Date   Anxiety state 11/14/2007   Qualifier: Diagnosis of  By: Jenny Reichmann MD, Hunt Oris    Breast cancer [facility])    R   CAROTID STENOSIS    Chronic venous insufficiency    CONTACT DERMATITIS    DUPUYTREN'S CONTRACTURE    Edema    pt  states had edema of feet and legs has been on lasix per dr. to help   GLUCOSE INTOLERANCE    HLD (hyperlipidemia)    Hypothyroidism    Dr. Ala Dach in [place]    Obesity    Osteoarthritis    bilat hips, severe   PVD (peripheral vascular disease) ([place])    mild carotid 11/05   VITAMIN D DEFICIENCY     Past Surgical History:  Procedure Laterality Date   2D echo  11/29/2003   normal LV size. normal EF    BACK SURGERY  12/06/2016   L3-L5 laminectomies per Dr. Maryellen Pile at [place]  05/11/2011   right breast   BREAST LUMPECTOMY     right breast   CHOLECYSTECTOMY     FINGER SURGERY     HERNIA REPAIR     incisional hernia   INGUINAL HERNIA REPAIR  JOINT REPLACEMENT Bilateral    Dr. Maureen Ralphs   normal caronary arteries     per pt. 2003   right rotator cuff surgery     [place]  05/2008 and 10/2008   B hip - alusio    Social History:  reports that she has never smoked. She has been exposed to tobacco smoke. She has never used smokeless tobacco. She reports that she does not drink alcohol and does not use drugs.  Allergies  Allergen Reactions   Prednisone Other (See Comments)    Muscle weakness  Caused severe numbness and weakness of  extremity   Shellfish Allergy Anaphylaxis and  Hives    Hives all over the body. Hives all over the body   Percocet [Oxycodone-Acetaminophen] Hives   Iodinated Contrast Media Other (See Comments)    PT IS NOT AWARE OF IODINE ALLERGY, PREMEDICATED FOR PRIOR PREMEDS ONLY//A.C. - unknown reaction   Iodine Hives    Family History  Problem Relation Age of Onset   Uterine cancer Mother 48   Cancer Mother    Ovarian cancer Mother    Heart disease Father        before age 88   Varicose Veins Father    Heart disease Brother        before age 73   Prostate cancer Maternal Uncle        diagnosed in his 26s   Prostate cancer Paternal Uncle        diagnosed in his 49s   Clotting disorder Maternal Grandmother    Pancreatic cancer Maternal Grandfather        diagnosed in his 94s   Breast cancer Daughter 30       BRCA negative (no BART)   Cancer Daughter    Diabetes Son    Colon cancer Cousin    Leukemia Cousin    Uterine cancer Cousin    Prostate cancer Cousin    Coronary artery disease Other        family hx - female 1st degree relative <60   Hypothyroidism Other        aunt    Anesthesia problems Neg Hx    Adrenal disorder Neg Hx    Esophageal cancer Neg Hx    Stomach cancer Neg Hx     Prior to Admission medications   Medication Sig Start Date End Date Taking? Authorizing Provider  albuterol (VENTOLIN HFA) 108 (90 Base) MCG/ACT inhaler Inhale 2 puffs into the lungs every 4 (four) hours as needed for wheezing or shortness of breath. 05/22/21   [name], MD  aspirin 81 MG EC tablet Take one every other day 02/24/21   [name], MD  atorvastatin (LIPITOR) 80 MG tablet Take 1 tablet (80 mg total) by mouth daily. 02/23/21   Early Osmond, MD  budesonide-formoterol (SYMBICORT) 80-4.5 MCG/ACT inhaler Inhale 2 puffs into the lungs 2 (two) times daily. Patient not taking: Reported on 06/10/2021 06/01/21   Jeanie Sewer, NP  Cholecalciferol (VITAMIN D3 PO) Take 250 mg by mouth 4 (four) times daily.    [provider]  Cyanocobalamin (B-12 PO) Take 1 tablet by mouth daily.     [provider]  furosemide (LASIX) 20 MG tablet Take one tablet once a day 06/05/21   [name], MD  levothyroxine (SYNTHROID) 112 MCG tablet Take 1 tablet (112 mcg total) by mouth daily before breakfast.  10/27/20   [name], MD  LORazepam (ATIVAN) 0.5 MG tablet Take 1 tablet (0.5 mg total) by mouth daily as needed for anxiety (to relax breathing). 06/01/21   Jeanie Sewer, NP  Nutritional Supplements (JUICE PLUS FIBRE PO) Juice Plus    [provider]  predniSONE (DELTASONE) 5 MG tablet Take 1 tablet (5 mg total) by mouth daily with breakfast. 06/03/21   [name], MD    Physical Exam:  Vitals:   06/11/21 0300 06/11/21 0330 06/11/21 0630 06/11/21 0900  BP: 136/60 128/61 132/63 133/82  Pulse: (!) 49 (!) 45 (!) 47 (!) 45  Resp: 14 14 13 17   Temp:      TempSrc:      SpO2: 96% 96% 97% 95%  Weight:      Height:       \ Constitutional: Awake alert and oriented x3, no associated distress.   Skin: Marked hyperpigmentation of the anterior surfaces of the bilateral lower extremities.  No rashes, no lesions, good skin turgor noted. Eyes: Pupils are equally reactive to light.  No evidence of scleral icterus or conjunctival pallor.  ENMT: Moist mucous membranes noted.  Posterior pharynx clear of any exudate or lesions.   Neck: normal, supple, no masses, no thyromegaly.  Slight elevation of the jugular venous pulse of 45 degrees  Respiratory: Scattered rhonchi bilaterally with mild bibasilar and mid field rales.   Normal respiratory effort. No accessory muscle use.  Cardiovascular: Regular rate and rhythm, no murmurs / rubs / gallops.  Pitting edema of the distal bilateral lower extremities that tracks to just below the knees.. 2+ pedal pulses. No carotid bruits.  Chest:   Nontender without crepitus or deformity.   Back:   Nontender without crepitus or deformity. Abdomen: Abdomen is soft  and nontender.  No evidence of intra-abdominal masses.  Positive bowel sounds noted in all quadrants.   Musculoskeletal: Significant pain with both passive and active range of motion of the left shoulder and left bicep.  No associated deformity noted.  no contractures. Normal muscle tone.  Neurologic: CN 2-12 grossly intact. Sensation intact.  Patient moving all 4 extremities spontaneously.  Patient is following all commands.  Patient is responsive to verbal stimuli.   Psychiatric: Patient exhibits normal mood with appropriate affect.  Patient seems to possess insight as to their current situation.    Data Reviewed:  I have personally reviewed and interpreted labs, imaging.  Significant findings are:  Chemistry revealing glucose 123, BUN 16, creatinine 1.12.  Lactic acid 1.6.   CBC revealing white blood cell count 14.6, hemoglobin 13.6, hematocrit 41.7, platelet count 386.   Coagulation profile revealing INR of 1.2.   2 sets of troponins were obtained and were found to be 41 and 57. CT angiogram of the chest dissection protocol revealing no evidence of dissection.  Revealing slightly prominent pulmonary trunk without evidence of central embolus.  Cardiomegaly without evidence of acute congestive heart failure.  Subpleural fibrosis with basal gradient and chronic groundglass disease.  EKG: Personally reviewed.  Rhythm is normal sinus rhythm with heart rate of 67 bpm.  No dynamic ST segment changes appreciated.   Assessment and Plan: * Acute respiratory failure with hypoxia [facility]) Patient presenting with several day history of worsening shortness of breath and hypoxia patient also complaining of left upper extremity pain and swelling. Patient now exhibiting an oxygen requirement, currently on 3 L of oxygen via nasal cannula Patient has recent diagnosis of interstitial lung disease, likely idiopathic  pulmonary fibrosis and has recently established care with [facility] At this point  considering patient's elevated D-dimer there is concern for a pulmonary embolism.  CT a dissection study did not confirm a PE and therefore the ER provider ordered a follow-up VQ scan which will need to be followed up on.  Furthermore a left upper extremity ultrasound to evaluate for any evidence of DVT has been ordered. Procalcitonin ordered. In the meantime continue to provide patient with supplemental oxygen Continuing bronchodilator therapy Continuing home regimen of maintenance inhalers Additionally patient may be suffering from superimposed acute diastolic congestive heart failure.  Providing patient with a course of intravenous Lasix considering increasing BNP compared to baseline even in the absence of significant pulmonary edema on chest imaging.  While there are notations of emphysema in the patient's chart, according to the patient's last pulmonology note there is no CT evidence of emphysema and pulmonary function testing performed 12/2020 revealed normal spirometry with moderate diffusion defect   ILD (interstitial lung disease) ([place]) Please see assessment and plan above  Elevated troponin level not due myocardial infarction Slightly elevated serial troponins with flat trajectory of elevation Patient is chest pain-free Likely secondary to underlying illness, plaque rupture is unlikely Monitoring patient on telemetry   Acute on chronic diastolic CHF (congestive heart failure) (HCC) Possible mild superimposed acute on chronic diastolic congestive heart failure. BNP slightly elevated compared to baseline Treating with course of intravenous Lasix Strict input and output monitoring Daily weights Please see remainder of assessment and plan above.  Leukocytosis Mild leukocytosis likely secondary to ongoing prednisone use CRP elevated, obtaining procalcitonin No definitive infectious process noted on CT imaging of the chest   Chronic kidney disease, stage 3a (HCC) Strict intake  and output monitoring Creatinine near baseline Minimizing nephrotoxic agents as much as possible Serial chemistries to monitor renal function and electrolytes   Essential hypertension Documented history of hypertension although patient is not on any scheduled medications in the outpatient setting As needed intravenous antihypertensives for markedly elevated blood pressure We will institute scheduled oral therapy as necessary    Hypothyroidism Resume home regimen of Synthroid         Code Status:  Full code  code status decision has been confirmed with: patient Family Communication: Daughter at bedside who has been updated on plan of care  Consults: None  Severity of Illness:  The appropriate patient status for this patient is INPATIENT. Inpatient status is judged to be reasonable and necessary in order to provide the required intensity of service to ensure the patient's safety. The patient's presenting symptoms, physical exam findings, and initial radiographic and laboratory data in the context of their chronic comorbidities is felt to place them at high risk for further clinical deterioration. Furthermore, it is not anticipated that the patient will be medically stable for discharge from the hospital within 2 midnights of admission.   * I certify that at the point of admission it is my clinical judgment that the patient will require inpatient hospital care spanning beyond 2 midnights from the point of admission due to high intensity of service, high risk for further deterioration and high frequency of surveillance required.*  Author:  Vernelle Emerald MD  06/11/2021 11:21 AM

## 2021-06-11 NOTE — ED Provider Notes (Signed)
I assumed care of this patient.  Please see previous provider note for further details of Hx, PE.  Briefly patient is a 88 y.o. female who presented SOB found to be hypoxic to 82% on RA.  Patient was also complaining of left arm pain and swelling with chest pain.  CXR negative Pending additional imaging  CTA was ordered to assess for dissection and central PEs which were negative. Patient is requiring 3 L nasal cannula.  VQ scan and left upper extremity duplex ordered for the morning.  Case discussed with Dr. Cyd Silence from the hospitalist service who agreed to admit patient for further work-up and management.     .Critical Care Performed by: Fatima Blank, MD Authorized by: Fatima Blank, MD   Critical care provider statement:    Critical care time (minutes):  30   Critical care time was exclusive of:  Separately billable procedures and treating other patients   Critical care was necessary to treat or prevent imminent or life-threatening deterioration of the following conditions:  Respiratory failure   Critical care was time spent personally by me on the following activities:  Development of treatment plan with patient or surrogate, discussions with consultants, evaluation of patient's response to treatment, examination of patient, obtaining history from patient or surrogate, review of old charts, re-evaluation of patient's condition, pulse oximetry, ordering and review of radiographic studies, ordering and review of laboratory studies and ordering and performing treatments and interventions   Care discussed with: admitting provider      Fatima Blank, MD 06/11/21 [id number]

## 2021-06-11 NOTE — ED Notes (Signed)
Lab called to add on respiratory panel.  

## 2021-06-11 NOTE — ED Notes (Signed)
Pt urinated on the bedside commode, but stool was visible in the specimen.

## 2021-06-11 NOTE — Assessment & Plan Note (Signed)
.   Resume home regimen of Synthroid 

## 2021-06-11 NOTE — Assessment & Plan Note (Signed)
.   Documented history of hypertension although patient is not on any scheduled medications in the outpatient setting . As needed intravenous antihypertensives for markedly elevated blood pressure . We will institute scheduled oral therapy as necessary

## 2021-06-11 NOTE — ED Notes (Signed)
ED TO INPATIENT HANDOFF REPORT  ED Nurse Name and Phone #: [phone] Caryl Pina RN   S Name/Age/Gender Rachael [name] 88 y.o. female Room/Bed: 043C/043C  [name] Status   [name] Status: Full [name]  Home/SNF/Other Home Patient oriented to: self, place, time, and situation Is this baseline? Yes   Triage Complete: Triage complete  Chief Complaint Acute respiratory failure with hypoxia ([place]) [J96.01]  Triage Note Patient seen at PCP and sats were 84% confusion low grade fever, weak shaking.  Sent to ER   Allergies Allergies  Allergen Reactions   Prednisone Other (See Comments)    Muscle weakness  Caused severe numbness and weakness of  extremity   Shellfish Allergy Anaphylaxis and Hives    Hives all over the body. Hives all over the body   Percocet [Oxycodone-Acetaminophen] Hives   Iodinated Contrast Media Other (See Comments)    PT IS NOT AWARE OF IODINE ALLERGY, PREMEDICATED FOR PRIOR PREMEDS ONLY//A.C. - unknown reaction   Iodine Hives    Level of Care/Admitting Diagnosis ED Disposition     ED Disposition  Admit   Condition  --   Comment  Hospital Area: [facility] [[id number]]  Level of Care: Telemetry Medical [104]  May admit patient to [facility] or [facility] if equivalent level of care is available:: No  Covid Evaluation: Asymptomatic - no recent exposure (last 10 days) testing not required  Diagnosis: Acute respiratory failure with hypoxia [facility]) [[id number]]  Admitting Physician: Vernelle Emerald [[id number]]  Attending Physician: Vernelle Emerald [[id number]]  Estimated length of stay: 3 - 4 days  Certification:: I certify this patient will need inpatient services for at least 2 midnights          B Medical/Surgery History Past Medical History:  Diagnosis Date   Anxiety state 11/14/2007   Qualifier: Diagnosis of  By: Jenny Reichmann MD, Hunt Oris    Breast cancer [facility])    R   CAROTID STENOSIS    Chronic venous insufficiency    CONTACT DERMATITIS     DUPUYTREN'S CONTRACTURE    Edema    pt  states had edema of feet and legs has been on lasix per dr. to help   GLUCOSE INTOLERANCE    HLD (hyperlipidemia)    Hypothyroidism    Dr. Ala Dach in [place]    Obesity    Osteoarthritis    bilat hips, severe   PVD (peripheral vascular disease) ([place])    mild carotid 11/05   VITAMIN D DEFICIENCY    Past Surgical History:  Procedure Laterality Date   2D echo  11/29/2003   normal LV size. normal EF    BACK SURGERY  12/06/2016   L3-L5 laminectomies per Dr. Maryellen Pile at [place]  05/11/2011   right breast   BREAST LUMPECTOMY     right breast   [place]     incisional hernia   INGUINAL HERNIA REPAIR     JOINT REPLACEMENT Bilateral    Dr. Maureen Ralphs   normal caronary arteries     per pt. 2003   right rotator cuff surgery     [place]  05/2008 and 10/2008   B hip - alusio     A IV Location/Drains/Wounds Patient Lines/Drains/Airways Status     Active Line/Drains/Airways  Name Placement date Placement time Site Days   Peripheral IV 06/10/21 20 G Right Antecubital 06/10/21  1845  Antecubital  1            Intake/Output Last 24 hours  Intake/Output Summary (Last 24 hours) at 06/11/2021 1206 Last data filed at 06/11/2021 0841 Gross per 24 hour  Intake 3102.5 ml  Output 800 ml  Net 2302.5 ml    Labs/Imaging Results for orders placed or performed during the hospital encounter of 06/10/21 (from the past 48 hour(s))  Comprehensive metabolic panel     Status: Abnormal   Collection Time: 06/10/21  5:41 PM  Result Value Ref Range   Sodium 136 135 - 145 mmol/L   Potassium 3.7 3.5 - 5.1 mmol/L   Chloride 104 98 - 111 mmol/L   CO2 21 (L) 22 - 32 mmol/L   Glucose, Bld 123 (H) 70 - 99 mg/dL    Comment: Glucose reference range applies only to samples taken after fasting for at least  8 hours.   BUN 16 8 - 23 mg/dL   Creatinine, Ser 1.12 (H) 0.44 - 1.00 mg/dL   Calcium 9.8 8.9 - 10.3 mg/dL   Total Protein 6.7 6.5 - 8.1 g/dL   Albumin 3.4 (L) 3.5 - 5.0 g/dL   AST 18 15 - 41 U/L   ALT 15 0 - 44 U/L   Alkaline Phosphatase 73 38 - 126 U/L   Total Bilirubin 2.8 (H) 0.3 - 1.2 mg/dL   GFR, Estimated 48 (L) >60 mL/min    Comment: (NOTE) Calculated using the CKD-EPI Creatinine Equation (2021)    Anion gap 11 5 - 15    Comment: Performed at [facility] [address], [place], [place] [address]  Lactic acid, plasma     Status: None   Collection Time: 06/10/21  5:41 PM  Result Value Ref Range   Lactic Acid, Venous 1.6 0.5 - 1.9 mmol/L    Comment: Performed at [facility] [address], [place], [place] [address]  CBC with Differential     Status: Abnormal   Collection Time: 06/10/21  5:41 PM  Result Value Ref Range   WBC 14.6 (H) 4.0 - 10.5 K/uL   RBC 4.67 3.87 - 5.11 MIL/uL   Hemoglobin 13.6 12.0 - 15.0 g/dL   HCT 41.7 36.0 - 46.0 %   MCV 89.3 80.0 - 100.0 fL   MCH 29.1 26.0 - 34.0 pg   MCHC 32.6 30.0 - 36.0 g/dL   RDW 14.2 11.5 - 15.5 %   Platelets 386 150 - 400 K/uL   nRBC 0.0 0.0 - 0.2 %   Neutrophils Relative % 83 %   Neutro Abs 12.2 (H) 1.7 - 7.7 K/uL   Lymphocytes Relative 7 %   Lymphs Abs 1.1 0.7 - 4.0 K/uL   Monocytes Relative 9 %   Monocytes Absolute 1.3 (H) 0.1 - 1.0 K/uL   Eosinophils Relative 0 %   Eosinophils Absolute 0.0 0.0 - 0.5 K/uL   Basophils Relative 0 %   Basophils Absolute 0.0 0.0 - 0.1 K/uL   Immature Granulocytes 1 %   Abs Immature Granulocytes 0.09 (H) 0.00 - 0.07 K/uL    Comment: Performed at [facility] [address], [place], [place] [address]  Protime-INR     Status: None   Collection Time: 06/10/21  5:41 PM  Result Value Ref Range   Prothrombin Time 15.1 11.4 - 15.2 seconds   INR  1.2 0.8 - 1.2    Comment: (NOTE) INR goal varies based on device and disease states. Performed at [facility], [place] [address], [place], [place] [address]   Troponin I (High Sensitivity)     Status: Abnormal   Collection Time: 06/10/21  5:41 PM  Result Value Ref Range   Troponin I (High Sensitivity) 41 (H) <18 ng/L    Comment: (NOTE) Elevated high sensitivity troponin I (hsTnI) values and significant  changes across serial measurements may suggest ACS but many other  chronic and acute conditions are known to elevate hsTnI results.  Refer to the "Links" section for chest pain algorithms and additional  guidance. Performed at [facility], [place] [address], [place], [place] [address]   Resp Panel by RT-PCR (Flu A&B, Covid) Anterior Nasal Swab     Status: None   Collection Time: 06/10/21  8:50 PM   Specimen: Anterior Nasal Swab  Result Value Ref Range   SARS Coronavirus 2 by RT PCR NEGATIVE NEGATIVE    Comment: (NOTE) SARS-CoV-2 target nucleic acids are NOT DETECTED.  The SARS-CoV-2 RNA is generally detectable in upper respiratory specimens during the acute phase of infection. The lowest concentration of SARS-CoV-2 viral copies this assay can detect is 138 copies/mL. A negative result does not preclude SARS-Cov-2 infection and should not be used as the sole basis for treatment or other patient management decisions. A negative result may occur with  improper specimen collection/handling, submission of specimen other than nasopharyngeal swab, presence of viral mutation(s) within the areas targeted by this assay, and inadequate number of viral copies(<138 copies/mL). A negative result must be combined with clinical observations, patient history, and epidemiological information. The expected result is Negative.  Fact Sheet for Patients:  EntrepreneurPulse.com.au  Fact Sheet for Healthcare Providers:  IncredibleEmployment.be  This test is no t yet approved or cleared by the [place] FDA and  has been authorized for detection and/or diagnosis of  SARS-CoV-2 by FDA under an Emergency Use Authorization (EUA). This EUA will remain  in effect (meaning this test can be used) for the duration of the COVID-19 declaration under Section 564(b)(1) of the Act, 21 U.S.C.section 360bbb-3(b)(1), unless the authorization is terminated  or revoked sooner.       Influenza A by PCR NEGATIVE NEGATIVE   Influenza B by PCR NEGATIVE NEGATIVE    Comment: (NOTE) The Xpert Xpress SARS-CoV-2/FLU/RSV plus assay is intended as an aid in the diagnosis of influenza from Nasopharyngeal swab specimens and should not be used as a sole basis for treatment. Nasal washings and aspirates are unacceptable for Xpert Xpress SARS-CoV-2/FLU/RSV testing.  Fact Sheet for Patients: EntrepreneurPulse.com.au  Fact Sheet for Healthcare Providers: IncredibleEmployment.be  This test is not yet approved or cleared by the [place] FDA and has been authorized for detection and/or diagnosis of SARS-CoV-2 by FDA under an Emergency Use Authorization (EUA). This EUA will remain in effect (meaning this test can be used) for the duration of the COVID-19 declaration under Section 564(b)(1) of the Act, 21 U.S.C. section 360bbb-3(b)(1), unless the authorization is terminated or revoked.  Performed at [facility], [place] [address] [place], [place] [address]   Lactic acid, plasma     Status: None   Collection Time: 06/10/21 10:15 PM  Result Value Ref Range   Lactic Acid, Venous 0.7 0.5 - 1.9 mmol/L    Comment: Performed at [facility] [address] [place], [place] [address]  Troponin  I (High Sensitivity)     Status: Abnormal   Collection Time: 06/10/21 10:15 PM  Result Value Ref Range   Troponin I (High Sensitivity) 57 (H) <18 ng/L    Comment: (NOTE) Elevated high sensitivity troponin I (hsTnI) values and significant  changes across serial measurements may suggest ACS but many other  chronic and acute conditions are  known to elevate hsTnI results.  Refer to the "Links" section for chest pain algorithms and additional  guidance. Performed at [facility], [place] [address], [place], [place] [address]   Brain natriuretic peptide     Status: Abnormal   Collection Time: 06/10/21 10:15 PM  Result Value Ref Range   B Natriuretic Peptide 291.6 (H) 0.0 - 100.0 pg/mL    Comment: Performed at [facility] [address] [place], [place] [address]  Urinalysis, Routine w reflex microscopic     Status: Abnormal   Collection Time: 06/11/21  2:55 AM  Result Value Ref Range   Color, Urine YELLOW YELLOW   APPearance CLEAR CLEAR   Specific Gravity, Urine 1.019 1.005 - 1.030   pH 5.0 5.0 - 8.0   Glucose, UA 150 (A) NEGATIVE mg/dL   Hgb urine dipstick SMALL (A) NEGATIVE   Bilirubin Urine NEGATIVE NEGATIVE   Ketones, ur 5 (A) NEGATIVE mg/dL   Protein, ur NEGATIVE NEGATIVE mg/dL   Nitrite NEGATIVE NEGATIVE   Leukocytes,Ua NEGATIVE NEGATIVE   RBC / HPF 0-5 0 - 5 RBC/hpf   WBC, UA 0-5 0 - 5 WBC/hpf   Bacteria, UA NONE SEEN NONE SEEN   Squamous Epithelial / LPF 0-5 0 - 5   Mucus PRESENT     Comment: Performed at [facility], [place] [address], [place], [place] [address]  D-dimer, quantitative     Status: Abnormal   Collection Time: 06/11/21  3:22 AM  Result Value Ref Range   D-Dimer, Quant 1.41 (H) 0.00 - 0.50 ug/mL-FEU    Comment: (NOTE) At the manufacturer cut-off value of 0.5 g/mL FEU, this assay has a negative predictive value of 95-100%.This assay is intended for use in conjunction with a clinical pretest probability (PTP) assessment model to exclude pulmonary embolism (PE) and deep venous thrombosis (DVT) in outpatients suspected of PE or DVT. Results should be correlated with clinical presentation. Performed at [facility], [place] [address] [place], [place] [address]   I-Stat venous blood gas, [facility] ED only)     Status: Abnormal   Collection Time: 06/11/21  3:37 AM  Result Value  Ref Range   pH, Ven 7.393 7.25 - 7.43   pCO2, Ven 37.5 (L) 44 - 60 mmHg   pO2, Ven 148 (H) 32 - 45 mmHg   Bicarbonate 22.9 20.0 - 28.0 mmol/L   TCO2 24 22 - 32 mmol/L   O2 Saturation 99 %   Acid-base deficit 2.0 0.0 - 2.0 mmol/L   Sodium 137 135 - 145 mmol/L   Potassium 3.9 3.5 - 5.1 mmol/L   Calcium, Ion 1.29 1.15 - 1.40 mmol/L   HCT 34.0 (L) 36.0 - 46.0 %   Hemoglobin 11.6 (L) 12.0 - 15.0 g/dL   Sample type VENOUS   Comprehensive metabolic panel     Status: Abnormal   Collection Time: 06/11/21  7:19 AM  Result Value Ref Range   Sodium 139 135 - 145 mmol/L   Potassium 3.9 3.5 - 5.1 mmol/L   Chloride 108 98 - 111 mmol/L   CO2 21 (L)  22 - 32 mmol/L   Glucose, Bld 158 (H) 70 - 99 mg/dL    Comment: Glucose reference range applies only to samples taken after fasting for at least 8 hours.   BUN 16 8 - 23 mg/dL   Creatinine, Ser 1.01 (H) 0.44 - 1.00 mg/dL   Calcium 9.8 8.9 - 10.3 mg/dL   Total Protein 6.3 (L) 6.5 - 8.1 g/dL   Albumin 3.0 (L) 3.5 - 5.0 g/dL   AST 24 15 - 41 U/L   ALT 15 0 - 44 U/L   Alkaline Phosphatase 67 38 - 126 U/L   Total Bilirubin 1.1 0.3 - 1.2 mg/dL   GFR, Estimated 55 (L) >60 mL/min    Comment: (NOTE) Calculated using the CKD-EPI Creatinine Equation (2021)    Anion gap 10 5 - 15    Comment: Performed at [facility], [place] [address], [place], [place] [address]  Magnesium     Status: None   Collection Time: 06/11/21  7:19 AM  Result Value Ref Range   Magnesium 1.8 1.7 - 2.4 mg/dL    Comment: Performed at [facility] [address], [place], [place] [address]  CBC with Differential/Platelet     Status: Abnormal   Collection Time: 06/11/21  7:19 AM  Result Value Ref Range   WBC 11.3 (H) 4.0 - 10.5 K/uL   RBC 4.61 3.87 - 5.11 MIL/uL   Hemoglobin 13.6 12.0 - 15.0 g/dL   HCT 40.8 36.0 - 46.0 %   MCV 88.5 80.0 - 100.0 fL   MCH 29.5 26.0 - 34.0 pg   MCHC 33.3 30.0 - 36.0 g/dL   RDW 14.5 11.5 - 15.5 %   Platelets 378 150 - 400 K/uL   nRBC  0.0 0.0 - 0.2 %   Neutrophils Relative % 79 %   Neutro Abs 9.0 (H) 1.7 - 7.7 K/uL   Lymphocytes Relative 15 %   Lymphs Abs 1.7 0.7 - 4.0 K/uL   Monocytes Relative 5 %   Monocytes Absolute 0.6 0.1 - 1.0 K/uL   Eosinophils Relative 0 %   Eosinophils Absolute 0.0 0.0 - 0.5 K/uL   Basophils Relative 0 %   Basophils Absolute 0.0 0.0 - 0.1 K/uL   Immature Granulocytes 1 %   Abs Immature Granulocytes 0.06 0.00 - 0.07 K/uL    Comment: Performed at [facility] [address], [place], [place] [address]  Procalcitonin - Baseline     Status: None   Collection Time: 06/11/21  7:19 AM  Result Value Ref Range   Procalcitonin 0.41 ng/mL    Comment:        Interpretation: PCT (Procalcitonin) <= 0.5 ng/mL: Systemic infection (sepsis) is not likely. Local bacterial infection is possible. (NOTE)       Sepsis PCT Algorithm           Lower Respiratory Tract                                      Infection PCT Algorithm    ----------------------------     ----------------------------         PCT < 0.25 ng/mL                PCT < 0.10 ng/mL          Strongly encourage  Strongly discourage   discontinuation of antibiotics    initiation of antibiotics    ----------------------------     -----------------------------       PCT 0.25 - 0.50 ng/mL            PCT 0.10 - 0.25 ng/mL               OR       >80% decrease in PCT            Discourage initiation of                                            antibiotics      Encourage discontinuation           of antibiotics    ----------------------------     -----------------------------         PCT >= 0.50 ng/mL              PCT 0.26 - 0.50 ng/mL               AND        <80% decrease in PCT             Encourage initiation of                                             antibiotics       Encourage continuation           of antibiotics    ----------------------------     -----------------------------        PCT >= 0.50 ng/mL                   PCT > 0.50 ng/mL               AND         increase in PCT                  Strongly encourage                                      initiation of antibiotics    Strongly encourage escalation           of antibiotics                                     -----------------------------                                           PCT <= 0.25 ng/mL                                                 OR                                        >  80% decrease in PCT                                      Discontinue / Do not initiate                                             antibiotics  Performed at [facility], [place] [address], [place], [place] [address]    DG Chest 2 View  Result Date: 06/10/2021 CLINICAL DATA:  Sepsis EXAM: CHEST - 2 VIEW COMPARISON:  05/20/2021 FINDINGS: Frontal and lateral views of the chest demonstrates stable enlargement of the cardiac silhouette. No acute airspace disease, effusion, or pneumothorax. Chronic interstitial scarring. No acute bony abnormalities. IMPRESSION: 1. Stable enlarged cardiac silhouette.  No acute airspace disease. Electronically Signed   By: Randa Ngo M.D.   On: 06/10/2021 18:22   CT HEAD WO CONTRAST (5MM)  Result Date: 06/11/2021 CLINICAL DATA:  Mental status changes with unknown cause. EXAM: CT HEAD WITHOUT CONTRAST TECHNIQUE: Contiguous axial images were obtained from the base of the skull through the vertex without intravenous contrast. RADIATION DOSE REDUCTION: This exam was performed according to the departmental dose-optimization program which includes automated exposure control, adjustment of the mA and/or kV according to patient size and/or use of iterative reconstruction technique. COMPARISON:  Head CT 02/24/2018, MR head 02/24/2018 FINDINGS: Brain: There is mild cerebral atrophy, small-vessel disease and atrophic ventriculomegaly with unremarkable cerebellum and brainstem. A small chronic left parietal cortical infarct is again seen. No  focal asymmetry is noted consistent with an acute infarct, hemorrhage or mass. There is no midline shift. Basal cisterns are clear. Vascular: There are calcifications of the carotid siphons but no hyperdense vessel is seen. Skull: No calvarial fracture or focal lesion is evident. Sinuses/Orbits: No acute findings. Other: None. IMPRESSION: No acute intracranial CT findings or interval changes. Mild atrophy and small-vessel disease. Small chronic left parietal cortical infarct. Electronically Signed   By: Telford Nab M.D.   On: 06/11/2021 02:03   DG Shoulder Left  Result Date: 06/10/2021 CLINICAL DATA:  Left shoulder pain, decreased range of motion EXAM: LEFT SHOULDER - 2+ VIEW COMPARISON:  None Available. FINDINGS: Internal rotation, external rotation, and transscapular views of the left shoulder are obtained. There is severe glenohumeral and acromioclavicular joint osteoarthritis. Narrowing of the acromial humeral interval suggests chronic longstanding rotator cuff tear. No acute fracture, subluxation, or dislocation. Visualized portions of the left chest are clear. IMPRESSION: 1. Severe degenerative changes of the left shoulder. No acute fracture. Electronically Signed   By: Randa Ngo M.D.   On: 06/10/2021 20:00   CT Angio Chest/Abd/Pel for Dissection W and/or Wo Contrast  Result Date: 06/11/2021 CLINICAL DATA:  Left shoulder and arm pain. Acute aortic syndrome suspected. EXAM: CT ANGIOGRAPHY CHEST, ABDOMEN AND PELVIS TECHNIQUE: Initial chest CT without contrast was performed axially. Multidetector CT imaging through the chest, abdomen and pelvis was performed using the standard protocol during bolus administration of intravenous contrast. Multiplanar reconstructed images and MIPs were obtained and reviewed to evaluate the vascular anatomy. RADIATION DOSE REDUCTION: This exam was performed according to the departmental dose-optimization program which includes automated exposure control, adjustment of  the mA and/or kV according to patient size and/or use of iterative reconstruction technique. CONTRAST:  52m OMNIPAQUE  IOHEXOL 350 MG/ML SOLN COMPARISON:  PA Lat chest yesterday chest x-ray 05/20/2021, CTA chest 05/20/2021, and chest, abdomen and pelvis CT with oral contrast only on 03/18/2015 FINDINGS: CTA CHEST FINDINGS Cardiovascular: There is moderate panchamber cardiomegaly with a slight left chamber predominance no pericardial fluid, and patchy three-vessel calcific CAD. There is a slightly prominent pulmonary trunk, main pulmonary arteries indicating arterial hypertension. There is no evidence of acute right heart strain. Pulmonary arteries are well opacified through the segmental level and no embolic filling defects are seen. There is mild aortic and great vessel atherosclerosis without aneurysm, dissection or penetrating ulcer with mild descending tortuosity and aberrant right subclavian artery origin again shown. The great vessels are widely patent. Mediastinum/Nodes: No adenopathy or thyroid mass is seen. There is no tracheal filling defect. There is no esophageal thickening. Lungs/Pleura: There is again noted subpleural reticulation with basal gradient and scattered areas of single-layer subpleural honeycombing in the bases, findings consistent with UIP pattern fibrosis. There is scattered peripheral ground-glass disease in the lower zones without interval improvement or worsening consistent with chronic change. No active lung infiltrate is suspected. The central airways are clear. There is no pleural effusion thickening or pneumothorax. Musculoskeletal: There is osteopenia with degenerative changes and bridging enthesopathy of the thoracic spine. No worrisome bone lesion is seen. No focal chest wall abnormality. Review of the MIP images confirms the above findings. CTA ABDOMEN AND PELVIS FINDINGS VASCULAR Aorta: Abdominal aorta is tortuous with moderate patchy calcification without evidence of aneurysm,  penetrating ulcer or dissection. Celiac: There are ostial calcifications causing a 75% origin stenosis. The remainder of the celiac artery is clear. There is mild scattered calcification of the splenic artery. SMA: Patent without evidence of aneurysm, dissection, vasculitis or significant stenosis. There are nonstenosing ostial calcifications superiorly. Renals: Both renal arteries are duplicated. On the left, the nondominant artery arises first from the posterolateral aortic wall with ostial calcifications causing up to 60% origin stenosis. This artery feeds in the upper pole. The dominant mid/lower pole artery also has ostial calcifications and up to 75% origin stenosis. The remainder of both left renal arteries are otherwise unremarkable. On the right, the hypoplastic upper pole artery arises first and is clear. The dominant mid/lower polar also demonstrates ostial calcifications but only about 40% ostial stenosis with remainder of both vessels widely clear. IMA: Patent without evidence of aneurysm, dissection, vasculitis or significant stenosis. Inflow: There patchy calcifications in the common iliac and internal iliac arteries less patchy more scattered calcification in both external iliac arteries but no flow-limiting stenoses in the inflow vessels. Veins: The veins are unopacified and not evaluated. No hyperdense filling defects are suspected. Evaluation of the pelvic deep veins limited by metal artifact from bilateral hip replacements. Review of the MIP images confirms the above findings. NON-VASCULAR Hepatobiliary: No focal abnormality seen in the liver. Status post cholecystectomy. No significant biliary prominence. Pancreas: Partially atrophic and otherwise unremarkable. Spleen: No focal abnormality or splenomegaly. Adrenals/Urinary Tract: There is no adrenal mass. Bilateral renal cysts are again noted largest on the left 7.5 cm in the inferior pole previously 6.3 cm in 2017. Largest on the right is in  the medial upper to midpole currently 7 cm, previously 5 cm. No renal mass enhancement, calculus or hydronephrosis is seen. The ureters are unremarkable down to the level of the hip replacements which obscure both of them. There is no obvious focal bladder abnormality with bladder not well seen due to the metallic artifact. Stomach/Bowel: No dilatation or  wall thickening. An appendix is not seen. There is sigmoid diverticulosis without evidence of diverticulitis. Lymphatic: No adenopathy is seen. Reproductive: The uterus is intact. The ovaries are not enlarged. The uterus is not well seen due to hip replacement artifacts. Other: There is no free air, hemorrhage or fluid. A broad-based ventral mesh hernia repair remains intact. Musculoskeletal: Hip replacements. There is osteopenia and degenerative change of the spine. There is interval new mild-to-moderate anterior wedge compression fracture of the L3 vertebral body treated with kyphoplasty, moderate wedging without kyphoplasty at L4 with slight retropulsion, and anterior bridging osteophytes between these segments extending to L5 consistent with a chronic process. A chronic ovoid 3 cm dense foreign body-like structure is seen in the subcutaneous plane dorsally overlying the right aspect of S5. There is advanced degenerative change of the lumbar spine with multilevel acquired spinal canal stenosis. Review of the MIP images confirms the above findings. IMPRESSION: 1. Aortic and coronary artery atherosclerosis without evidence of acute aortic syndrome. 2. Aberrant retroesophageal right subclavian artery. 3. Slightly prominent pulmonary trunk, chronically noted without evidence of central embolus. 4. Cardiomegaly without evidence of acute CHF. 5. Subpleural fibrosis with a basal gradient and chronic ground-glass disease. 6. Bilaterally duplicated renal arteries with stenoses described above. 7. 75% calcific stenosis of the celiac artery origin. 8. Chronic findings in  the abdomen and pelvis with no acute abnormality demonstrated. 9. Multilevel acquired lumbar spinal canal stenosis with chronic compression fractures of L3 and 4, L3 treated with kyphoplasty. This was not seen in 2017. 10. Bilateral hip replacements mostly obscuring the pelvic structures. Electronically Signed   By: Telford Nab M.D.   On: 06/11/2021 01:57    Pending Labs Unresulted Labs (From admission, onward)     Start     Ordered   06/12/21 0500  Procalcitonin  Daily,   R      06/11/21 0740   06/11/21 0741  Procalcitonin - Baseline  Add-on,   AD        06/11/21 0740   06/11/21 0741  Respiratory (~20 pathogens) panel by PCR  (Respiratory panel by PCR (~20 pathogens, ~24 hr TAT)  w precautions)  Once,   R        06/11/21 0740   06/10/21 1730  Culture, blood (Routine x 2)  BLOOD CULTURE X 2,   R (with STAT occurrences)      06/10/21 1729            Vitals/Pain Today's Vitals   06/11/21 0300 06/11/21 0330 06/11/21 0630 06/11/21 0900  BP: 136/60 128/61 132/63 133/82  Pulse: (!) 49 (!) 45 (!) 47 (!) 45  Resp: '14 14 13 17  '$ Temp:      TempSrc:      SpO2: 96% 96% 97% 95%  Weight:      Height:      PainSc:        Isolation Precautions No active isolations  Medications Medications  aspirin EC tablet 81 mg (81 mg Oral Given 06/11/21 0856)  atorvastatin (LIPITOR) tablet 80 mg (80 mg Oral Given 06/11/21 0855)  fluticasone furoate-vilanterol (BREO ELLIPTA) 100-25 MCG/ACT 1 puff (1 puff Inhalation Not Given 06/11/21 0858)  furosemide (LASIX) tablet 20 mg (has no administration in time range)  levothyroxine (SYNTHROID) tablet 112 mcg (112 mcg Oral Given 06/11/21 0647)  predniSONE (DELTASONE) tablet 5 mg (5 mg Oral Given 06/11/21 0856)  enoxaparin (LOVENOX) injection 40 mg (40 mg Subcutaneous Given 06/11/21 0856)  acetaminophen (TYLENOL) tablet 650 mg (has  no administration in time range)    Or  acetaminophen (TYLENOL) suppository 650 mg (has no administration in time range)  polyethylene  glycol (MIRALAX / GLYCOLAX) packet 17 g (has no administration in time range)  ondansetron (ZOFRAN) tablet 4 mg (has no administration in time range)    Or  ondansetron (ZOFRAN) injection 4 mg (has no administration in time range)  ipratropium-albuterol (DUONEB) 0.5-2.5 (3) MG/3ML nebulizer solution 3 mL (has no administration in time range)  hydrALAZINE (APRESOLINE) injection 10 mg (has no administration in time range)  ipratropium-albuterol (DUONEB) 0.5-2.5 (3) MG/3ML nebulizer solution 3 mL (has no administration in time range)  furosemide (LASIX) injection 20 mg (has no administration in time range)  lactated ringers bolus 1,000 mL (0 mLs Intravenous Stopped 06/10/21 2249)  morphine (PF) 4 MG/ML injection 4 mg (4 mg Intravenous Given 06/10/21 1853)  methylPREDNISolone sodium succinate (SOLU-MEDROL) 40 mg/mL injection 40 mg (40 mg Intravenous Given 06/10/21 2043)  diphenhydrAMINE (BENADRYL) capsule 50 mg ( Oral See Alternative 06/10/21 2343)    Or  diphenhydrAMINE (BENADRYL) injection 50 mg (50 mg Intravenous Given 06/10/21 2343)  iohexol (OMNIPAQUE) 350 MG/ML injection 80 mL (80 mLs Intravenous Contrast Given 06/11/21 0116)  furosemide (LASIX) injection 20 mg (20 mg Intravenous Given 06/11/21 0648)    Mobility walks with person assist High fall risk   Focused Assessments Pulmonary Assessment Handoff:  Lung sounds: L Breath Sounds: Diminished R Breath Sounds: Diminished O2 Device: Nasal Cannula O2 Flow Rate (L/min): 3 L/min    R Recommendations: See Admitting Provider Note  Report given to:   Additional Notes: Pt A&Ox4, stands with assistance to [facility], 3LNC

## 2021-06-12 ENCOUNTER — Other Ambulatory Visit (HOSPITAL_COMMUNITY): Payer: Self-pay

## 2021-06-12 DIAGNOSIS — J9601 Acute respiratory failure with hypoxia: Secondary | ICD-10-CM | POA: Diagnosis not present

## 2021-06-12 LAB — PROCALCITONIN: Procalcitonin: 0.43 ng/mL

## 2021-06-12 MED ORDER — AZITHROMYCIN 500 MG PO TABS
500.0000 mg | ORAL_TABLET | Freq: Every day | ORAL | 0 refills | Status: AC
  Filled 2021-06-12: qty 2, 2d supply, fill #0

## 2021-06-12 MED ORDER — CEFADROXIL 500 MG PO CAPS
500.0000 mg | ORAL_CAPSULE | Freq: Two times a day (BID) | ORAL | 0 refills | Status: AC
  Filled 2021-06-12: qty 10, 5d supply, fill #0

## 2021-06-12 NOTE — Progress Notes (Signed)
TOC meds at the bedside.

## 2021-06-12 NOTE — Progress Notes (Signed)
RN went over discharge instructions with patient and patient's daughter at the bedside. Patient made aware of medication changes and follow up visits. TOC meds at bedside. Oxygen and concentrator at bedside. Patient verbalize understanding and has no questions for the RN. PIV removed by NT and patient tolerated well. Tele monitor removed and CCMD notified of d/c. Patient is getting dressed. Transportation is in the room.

## 2021-06-12 NOTE — Progress Notes (Signed)
Mobility Specialist Progress Note:   06/12/21 1142  Mobility  Activity Ambulated with assistance in hallway  Level of Assistance Independent after set-up  Assistive Device Front wheel walker  Distance Ambulated (ft) 220 ft  Activity Response Tolerated well  $Mobility charge 1 Mobility   Pt received in bed willing to participate in mobility. No complaints of pain. Left in bed with call bell in reach and all needs met.   [facility] Aleia Larocca Mobility Specialist

## 2021-06-12 NOTE — Progress Notes (Signed)
SATURATION QUALIFICATIONS: (This note is used to comply with regulatory documentation for home oxygen)  Patient Saturations on Room Air at Rest = 91%  Patient Saturations on Room Air while Ambulating = 87%  Patient Saturations on 3 Liters of oxygen while Ambulating = 93%

## 2021-06-12 NOTE — TOC Initial Note (Addendum)
Transition of Care [facility]) - Initial/Assessment Note    Patient Details  Name: Rachael [name] MRN: [id number] Date of Birth: Oct 10, 1933  Transition of Care [facility]) CM/SW Contact:    Marilu Favre, RN Phone Number: 06/12/2021, 10:35 AM  Clinical Narrative:                  Patient from home with son. Confirmed face sheet information. PCP is Dr Sarajane Jews, she has transportation to appointments and can get medications.   Discussed home oxygen. [facility] will deliver oxygen to patient room prior to discharge.    1229 received message from nurse , daughter asking for a smaller portable oxygen tank . Spoke to [place] with [employer] . Letta Kocher requesting  MD to order a POC eval , if ordered respiratory with [place] will call patient to arrange an appointment for patient to go to [facility] in [place] for evaluation. Ordered entered . [place] aware  Expected Discharge Plan: Home/Self Care Barriers to Discharge: No Barriers Identified   Patient Goals and CMS Choice Patient states their goals for this hospitalization and ongoing recovery are:: to return to home CMS Medicare.gov Compare Post Acute Care list provided to:: Patient Choice offered to / list presented to : Patient  Expected Discharge Plan and Services Expected Discharge Plan: Home/Self Care     Post Acute Care Choice: Durable Medical Equipment Living arrangements for the past 2 months: Single Family Home Expected Discharge Date: 06/12/21               DME Arranged: Oxygen DME Agency: [facility] Date DME Agency Contacted: 06/12/21 Time DME Agency Contacted: 1034 Representative spoke with at DME Agency: [place]: NA          Prior Living Arrangements/Services Living arrangements for the past 2 months: [place] Lives with:: Adult Children Patient language and need for interpreter reviewed:: Yes        Need for Family Participation in Patient Care: Yes (Comment) Care giver support system in place?: Yes  (comment)   Criminal Activity/Legal Involvement Pertinent to Current Situation/Hospitalization: No - Comment as needed  Activities of Daily Living      Permission Sought/Granted   Permission granted to share information with : No              Emotional Assessment Appearance:: Appears stated age Attitude/Demeanor/Rapport: Engaged Affect (typically observed): Accepting Orientation: : Oriented to Self, Oriented to Place, Oriented to  Time, Oriented to Situation Alcohol / Substance Use: Not Applicable Psych Involvement: No (comment)  Admission diagnosis:  Confusion [R41.0] Acute respiratory failure with hypoxia (HCC) [J96.01] Dyspnea, unspecified type [R06.00] Patient Active Problem List   Diagnosis Date Noted   Acute respiratory failure with hypoxia ([place]) 06/11/2021   Essential hypertension 06/11/2021   Elevated troponin level not due myocardial infarction 06/11/2021   Leukocytosis 06/11/2021   ILD (interstitial lung disease) ([place]) 06/04/2021   Frequent episodes of bronchitis 06/04/2021   Acute on chronic diastolic CHF (congestive heart failure) ([place]) 06/04/2021   Snoring 06/04/2021   Pulmonary fibrosis ([place]) 05/22/2021   Emphysema with chronic bronchitis ([place]) 05/19/2021   Ptosis of both eyelids 01/19/2021   Dyspnea 11/12/2020   Cellulitis of right leg 11/14/2019   Acquired hypothyroidism 07/18/2019   Weight gain 07/18/2019   OAB (overactive bladder) 04/23/2019   Acute encephalopathy 02/25/2018   Bradycardia 02/25/2018   TIA (transient ischemic attack) 02/24/2018   Carotid artery stenosis 02/24/2018   Malignant neoplasm of lower-inner quadrant of right  breast of female, estrogen receptor positive ([place]) 03/28/2017   PMR (polymyalgia rheumatica) ([place]) 11/11/2016   Spinal stenosis, lumbar 08/26/2016   SIRS (systemic inflammatory response syndrome) (HCC)    Weakness of both lower extremities    Arthritis    Gout attack 10/23/2014   Anemia in neoplastic disease  04/02/2014   Chronic kidney disease, stage 3a ([place]) 04/02/2014   Acute renal failure ([place]) 03/18/2014   Leg edema, right    Breast cancer, right breast ([place]) 05/13/2011   Incisional hernia, incarcerated, right upper quadrant 07/13/2010   HEADACHE 11/06/2009   FATIGUE 06/13/2009   EDEMA 05/01/2008   Backache 03/29/2008   CAROTID STENOSIS 03/16/2008   MENOPAUSAL SYNDROME 09/27/2007   GLUCOSE INTOLERANCE 08/10/2007   Dyslipidemia 08/10/2007   PERIPHERAL VASCULAR DISEASE 08/10/2007   Venous (peripheral) insufficiency 08/10/2007   Vitamin D deficiency 12/26/2006   OBESITY 12/26/2006   Hypothyroidism 11/05/2006   Osteoarthritis 11/05/2006   PCP:  [name], MD Pharmacy:   [facility] ORDER) [place], [place] [address] Phone: [phone] Fax: [place] [address] [place] [address] [place] [address] Phone: [phone] Fax: [phone]  [facility] [address] [place] [address] Phone: [phone] Fax: [phone]     Social Determinants of Health (SDOH) Interventions    Readmission Risk Interventions     View : No data to display.

## 2021-06-12 NOTE — Progress Notes (Signed)
NAME:  Rachael [name], MRN:  [id number], DOB:  1933-05-03, LOS: 1 ADMISSION DATE:  06/10/2021, CONSULTATION DATE: 06/12/2021 REFERRING MD: Dr. Maren Beach, CHIEF COMPLAINT: Shortness of breath  History of Present Illness:  88 year old female with a plethora of health issues as documented below.  She is followed by cardiology for bradycardia.  He has a history of COVID 2021.  She established with pulmonology 12/30/2020 Dr. Annamaria Boots.  She is having approximately 10 days of increasing shortness of breath orthopnea fevers chills or sweats and has a known history of pulmonary fibrosis by CT scan.  She is a never smoker but was exposed to heavy cigarette smoking child for her father.  Pulmonary critical care asked to evaluate.  She is currently O2 dependent at 2 L nasal  Pertinent  Medical History   Past Medical History:  Diagnosis Date   Anxiety state 11/14/2007   Qualifier: Diagnosis of  By: Jenny Reichmann MD, Hunt Oris    Bre[facility])    R   CAROTID STENOSIS    Chronic venous insufficiency    CONTACT DERMATITIS    DUPUYTREN'S CONTRACTURE    Edema    pt  states had edema of feet and legs has been on lasix per dr. to help   GLUCOSE INTOLERANCE    HLD (hyperlipidemia)    Hypothyroidism    Dr. Ala Dach in [place]    Obesity    Osteoarthritis    bilat hips, severe   PVD (peripheral vascular disease) ([place])    mild carotid 11/05   VITAMIN D DEFICIENCY      Significant Hospital Events: Including procedures, antibiotic start and stop dates in addition to other pertinent events   Pulmonary consult 06/11/2021  Interim History / Subjective:  History of pulmonary fibrosis, increasing shortness of breath On oxygen supplementation  Objective   Blood pressure (!) 108/53, pulse (!) 55, temperature (!) 97.4 F (36.3 C), temperature source Oral, resp. rate 17, height '5\' 7"'$  (1.702 m), weight 99.8 kg, SpO2 99 %.        Intake/Output Summary (Last 24 hours) at 06/12/2021 0958 Last  data filed at 06/12/2021 0600 Gross per 24 hour  Intake 590 ml  Output 350 ml  Net 240 ml   Filed Weights   06/10/21 1725 06/11/21 1322 06/12/21 0016  Weight: 97.5 kg 100.3 kg 99.8 kg    Examination: General: Elderly, does not appear to be in acute distress HENT: Moist oral mucosa Lungs: Rales at the bases bilaterally Cardiovascular: S1-S2 appreciated Abdomen: Soft, bowel sounds appreciated Extremities: Edema noted Neuro: Alert and oriented x3 GU: Fair output  Resolved Hospital Problem list     Assessment & Plan:  Hypoxemic respiratory failure -Less likely related to exacerbation of interstitial lung disease -Possibly community-acquired pneumonia -We will suggest to complete course of antibiotics -Procalcitonin 0.41 at presentation  Chronic steroid dependence with exacerbation of fatigue and shortness of breath upon trying to wean off steroids -Currently on 5 mg  Interstitial lung disease -Does have an appointment to follow-up with Dr. [name] or [facility] for further evaluation and work-up -Chronic groundglass changes on CT -Subpleural fibrosis with basal gradient noted  Decompensated heart failure -This is likely contributing to some of her shortness of breath -Continue cautious diuresis  Bradycardia -This is chronic and asymptomatic at present  Hypothyroidism -Continue Synthroid  Chronic back pain -Multilevel compression fractures, status post kyphoplasty in the past  She required 4 L of oxygen with ambulation  Stable  for discharge from a pulmonary perspective  Sherrilyn Rist, MD [place] PCCM Pager: See Shea Evans

## 2021-06-15 ENCOUNTER — Telehealth: Payer: Self-pay

## 2021-06-15 LAB — CULTURE, BLOOD (ROUTINE X 2)
Culture: NO GROWTH
Culture: NO GROWTH

## 2021-06-15 NOTE — Telephone Encounter (Signed)
Transition Care Management Follow-up Telephone Call Date of discharge and from where: [facility] 06-12-21 Dx: Acute respiratory failure with hypoxia How have you been since you were released from the hospital? Doing good  Any questions or concerns? No  Items Reviewed: Did the pt receive and understand the discharge instructions provided? Yes  Medications obtained and verified? Yes  Other? No  Any new allergies since your discharge? no Dietary orders reviewed? Yes Do you have support at home? Yes   Home Care and Equipment/Supplies: Were home health services ordered? no If so, what is the name of the agency? na  Has the agency set up a time to come to the patient's home? not applicable Were any new equipment or medical supplies ordered?  Yes: oxygen What is the name of the medical supply agency? [employer] [facility]  Were you able to get the supplies/equipment? yes Do you have any questions related to the use of the equipment or supplies? No  Functional Questionnaire: (I = Independent and D = Dependent) ADLs: I  Bathing/Dressing- I  Meal Prep- I  Eating- I  Maintaining continence- I  Transferring/Ambulation- I  Managing Meds- I  Follow up appointments reviewed:  PCP Hospital f/u appt confirmed? Yes  Scheduled to see Dr Sarajane Jews  on 06-19-21 @ [facility] f/u appt confirmed? Yes  Scheduled to see Dr  Jeananne Rama on 07-01-21 @ 330pm. Are transportation arrangements needed? No  If their condition worsens, is the pt aware to call PCP or go to the Emergency Dept.? Yes Was the patient provided with contact information for the PCP's office or ED? Yes Was to pt encouraged to call back with questions or concerns? Yes

## 2021-06-16 IMAGING — MG MM DIGITAL SCREENING BILAT W/ TOMO AND CAD
6 of 12 series · 6 of 36 positions shown · non-contrast
Comparison: Previous exam(s).

ACR Breast Density Category a: The breast tissue is almost entirely
fatty.

CLINICAL DATA: Screening.

EXAM:
DIGITAL SCREENING BILATERAL MAMMOGRAM WITH TOMOSYNTHESIS AND CAD
TECHNIQUE: Bilateral screening digital craniocaudal and mediolateral oblique
mammograms were obtained. Bilateral screening digital breast
tomosynthesis was performed. The images were evaluated with
computer-aided detection.

[L CC synth-2D (1 of 2)]
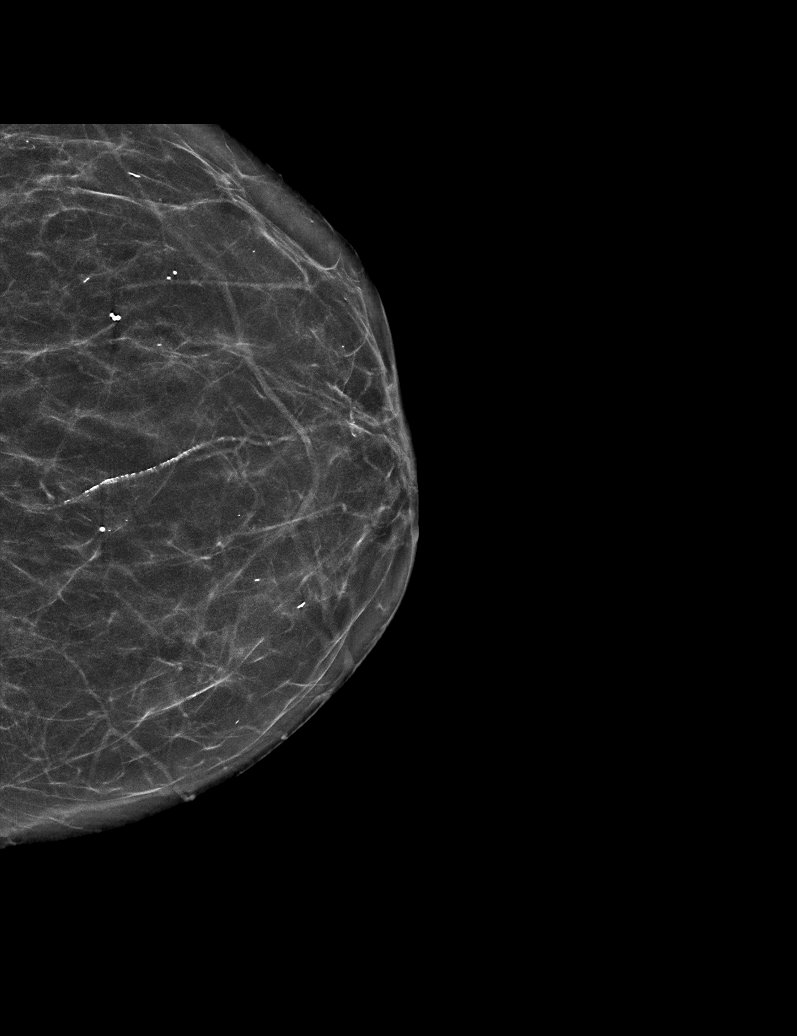

[R CC synth-2D (1 of 2)]
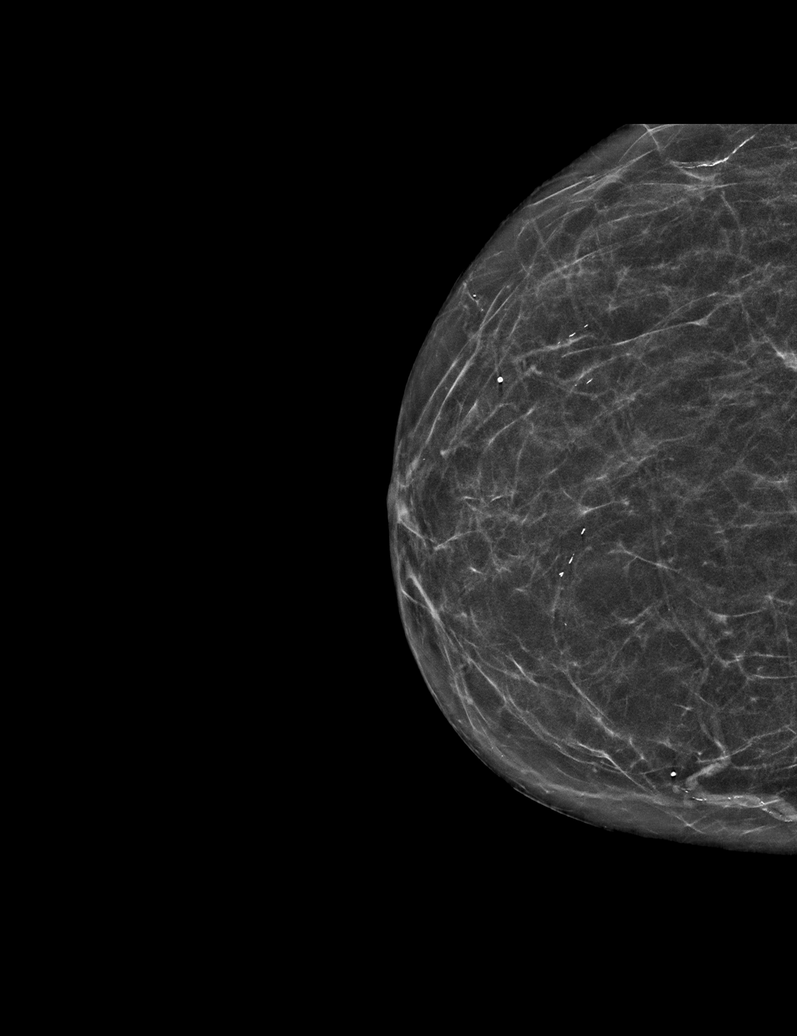

[R MLO synth-2D]
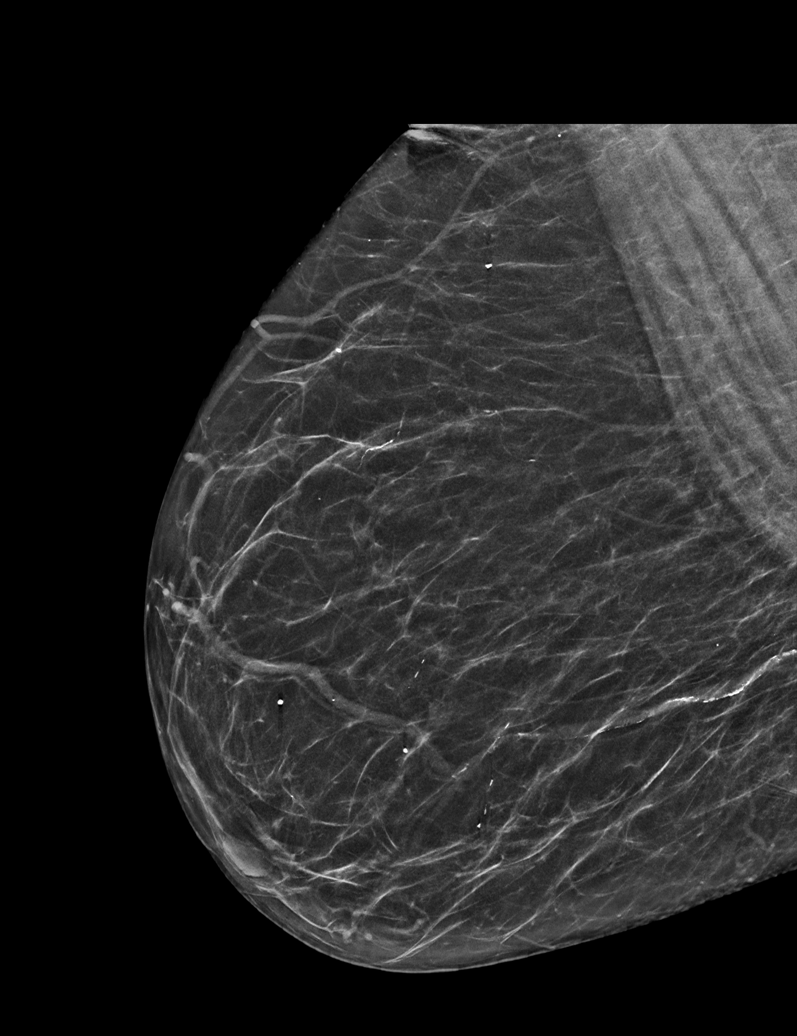

[R CC synth-2D (2 of 2)]
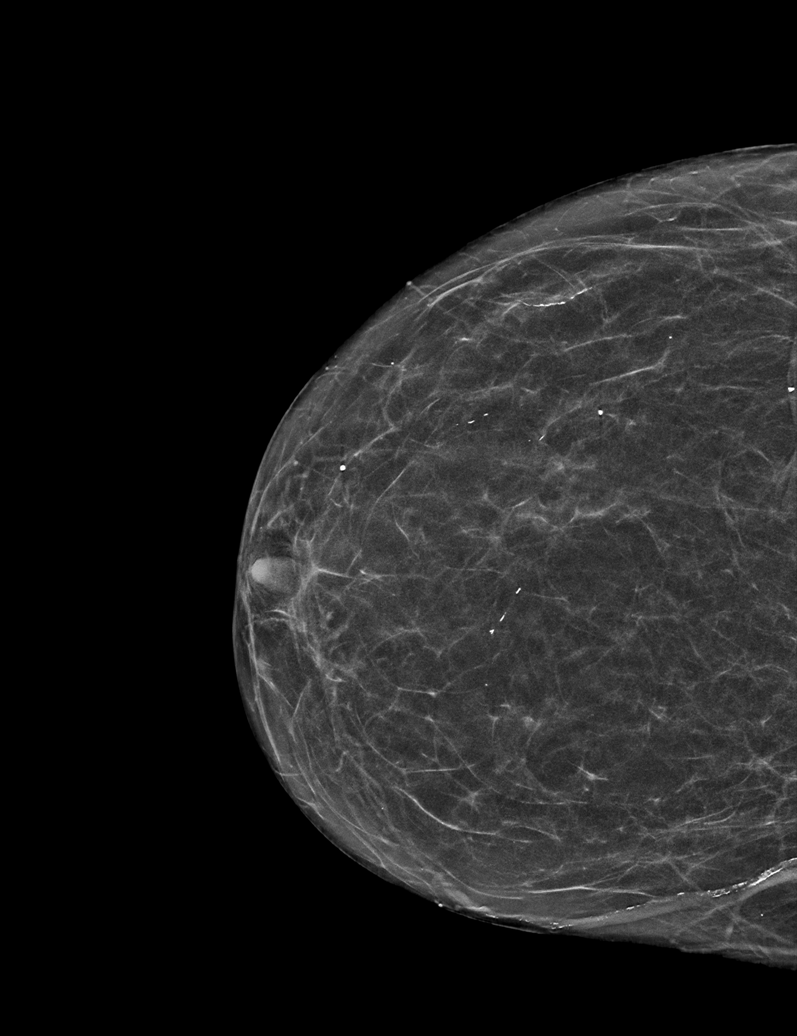

[L CC synth-2D (2 of 2)]
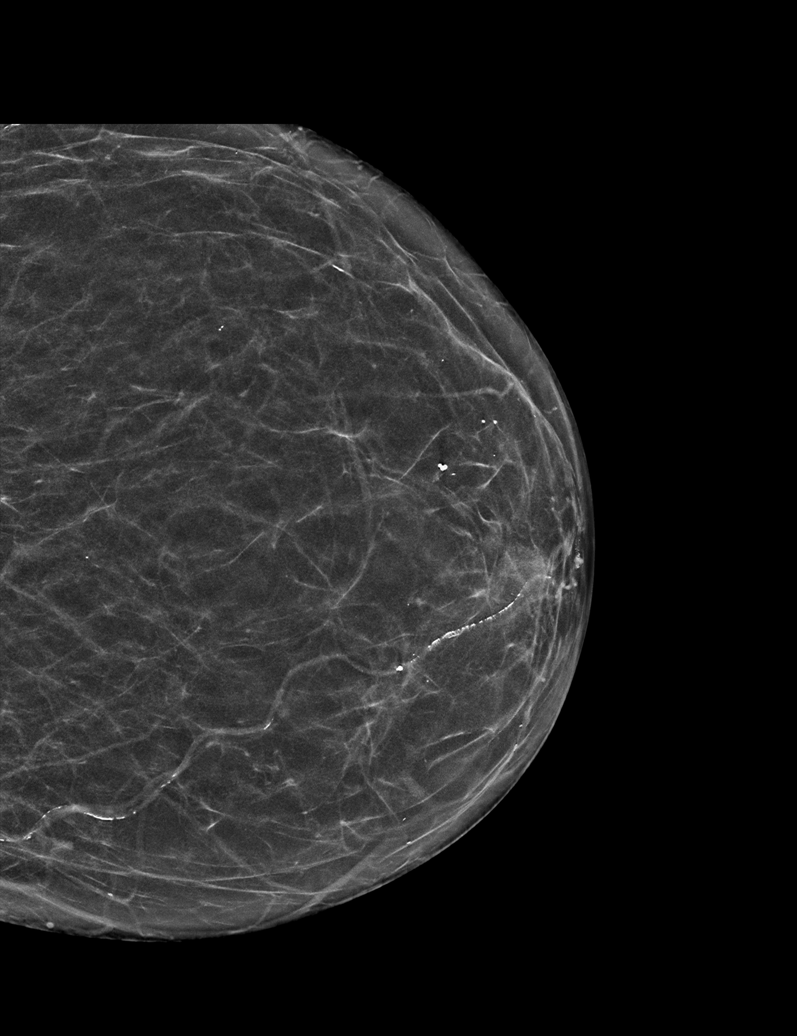

[L MLO synth-2D]
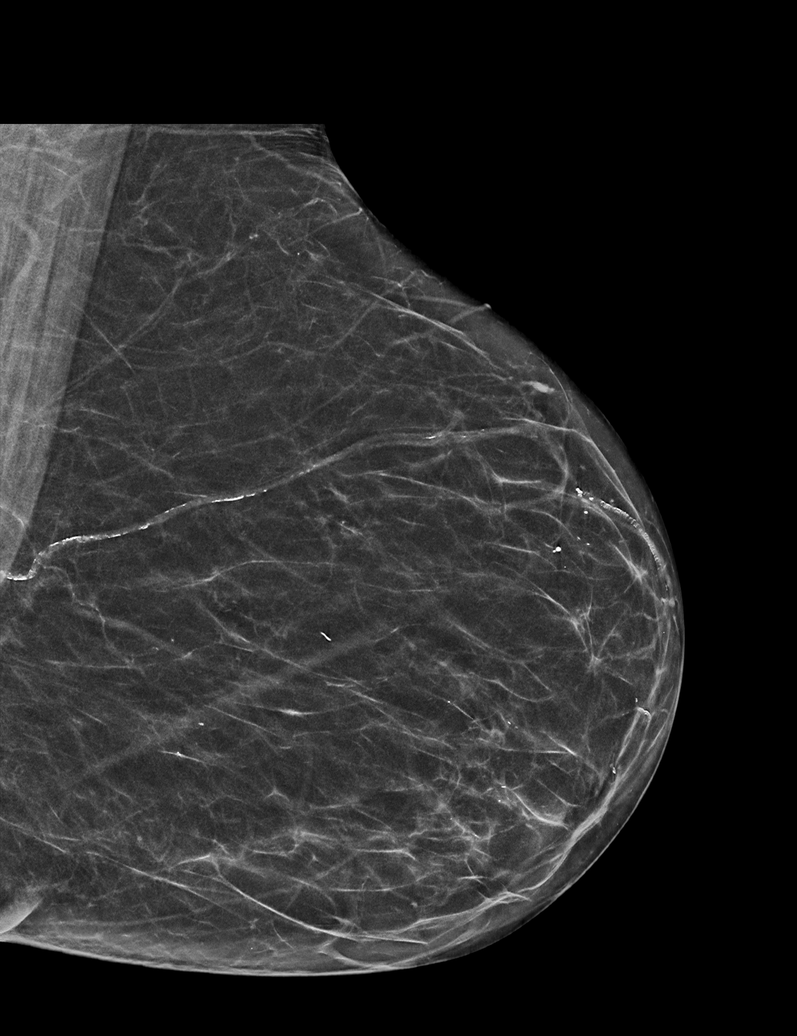

[6 of 36 positions shown; findings below may reference images not displayed]

FINDINGS: There are no findings suspicious for malignancy. The images were
evaluated with computer-aided detection.
IMPRESSION: No mammographic evidence of malignancy. A result letter of this
screening mammogram will be mailed directly to the patient.

RECOMMENDATION:
Screening mammogram in one year. (Code:JP-J-DD5)

BI-RADS CATEGORY  1: Negative.

## 2021-06-19 ENCOUNTER — Encounter: Payer: Self-pay | Admitting: Family Medicine

## 2021-06-19 ENCOUNTER — Ambulatory Visit (INDEPENDENT_AMBULATORY_CARE_PROVIDER_SITE_OTHER): Payer: Medicare HMO | Admitting: Family Medicine

## 2021-06-19 VITALS — BP 102/60 | HR 54 | Temp 98.6°F | Wt 215.0 lb

## 2021-06-19 DIAGNOSIS — I5033 Acute on chronic diastolic (congestive) heart failure: Secondary | ICD-10-CM

## 2021-06-19 DIAGNOSIS — J849 Interstitial pulmonary disease, unspecified: Secondary | ICD-10-CM

## 2021-06-19 DIAGNOSIS — N1831 Chronic kidney disease, stage 3a: Secondary | ICD-10-CM

## 2021-06-19 DIAGNOSIS — J9601 Acute respiratory failure with hypoxia: Secondary | ICD-10-CM | POA: Diagnosis not present

## 2021-06-19 MED ORDER — PREDNISONE 5 MG PO TABS: 2.5000 mg | ORAL_TABLET | Freq: Every day | ORAL | 2 refills | Status: DC

## 2021-06-19 NOTE — Progress Notes (Signed)
   Subjective:    Patient ID: Rachael [name], female    DOB: [birth year], 88 y.o.   MRN: [id number]  HPI Here with her son to follow up a hospital stay from 06-10-21 to 06-12-21 for hypoxia and fever. She presented to our clinic the day of admission with profound SOB and hypoxia, she had some mental status changes, and she complained of severe left arm pain. No recent trauma that she could remember. She also had a fever of 100.2 degrees. She was placed on 3 liters of oxygen here, and then her family rushed her to the ED. She had a chest CTA which was negative for any PE's and negative for any pneumonia. An US of the left arm was negative for DVT. Her admission WBC was elevated at 14.6. She was presumed to have an infection, but no source was ever identified. She was found to have an acute exacerbation of CHF and in acute respiratory failure. She was treated with IV Lasix and IV and empiric antibiotics. She responded well, and they were able to DC the oxygen. The WBC was down to 11.3 by DC. Her renal function remained steady through all this. The creatinine went from 1.12 at admission to 1.01 by DC. She was sent home wit few more days of antibiotics, which she has finished. Today she feels back to baseline. Her breathing is back to baseline, and her sats remain 95-99 % on room air.    Review of Systems  Constitutional: Negative.   Respiratory:  Positive for shortness of breath. Negative for cough and wheezing.   Cardiovascular:  Positive for leg swelling. Negative for chest pain and palpitations.  Gastrointestinal: Negative.   Genitourinary: Negative.        Objective:   Physical Exam Constitutional:      General: She is not in acute distress.    Comments: Walking with a walker   Cardiovascular:     Rate and Rhythm: Normal rate and regular rhythm.     Pulses: Normal pulses.     Heart sounds: Normal heart sounds.  Pulmonary:     Effort: Pulmonary effort is normal.     Breath sounds: No wheezing  or rales.     Comments: Soft bibasilar crackles  Musculoskeletal:     Comments: 2+ ankle edema   Neurological:     Mental Status: She is alert and oriented to person, place, and time. Mental status is at baseline.           Assessment & Plan:  She has recovered well from a recent epsiode of ARF with hypoxia and acute on chronic CHF, seemingly triggered by an infection of some sort. She is now back to baseline. She is scheduled to have PFT's done on 06-30-21, and she will see Dr. [name] in Pulmonology on 07-01-21. She will continue on maintenance Symbicort BID and rescue albuterol as needed. She is off oxygen, but she will keep it available to use if needed. We will decrease the Prednisone to 2.5 mg daily. We spent a total of ( 35  ) minutes reviewing records and discussing these issues.  Alysia Penna, MD

## 2021-06-30 ENCOUNTER — Ambulatory Visit (INDEPENDENT_AMBULATORY_CARE_PROVIDER_SITE_OTHER): Payer: Medicare HMO | Admitting: Pulmonary Disease

## 2021-06-30 DIAGNOSIS — J849 Interstitial pulmonary disease, unspecified: Secondary | ICD-10-CM

## 2021-06-30 LAB — PULMONARY FUNCTION TEST
DL/VA % pred: 100 %
DL/VA: 3.96 ml/min/mmHg/L
DLCO cor % pred: 89 %
DLCO cor: 18.37 ml/min/mmHg
DLCO unc % pred: 84 %
DLCO unc: 17.27 ml/min/mmHg
FEF 25-75 Pre: 1.77 L/sec
FEF2575-%Pred-Pre: 133 %
FEV1-%Pred-Pre: 97 %
FEV1-Pre: 2.01 L
FEV1FVC-%Pred-Pre: 106 %
FEV6-%Pred-Pre: 98 %
FEV6-Pre: 2.57 L
FEV6FVC-%Pred-Pre: 105 %
FVC-%Pred-Pre: 93 %
FVC-Pre: 2.59 L
Pre FEV1/FVC ratio: 77 %
Pre FEV6/FVC Ratio: 100 %

## 2021-06-30 NOTE — Patient Instructions (Signed)
Spirometry and DLCO Performed Today.  

## 2021-06-30 NOTE — Progress Notes (Signed)
Spirometry and DLCO Performed Today.  

## 2021-07-01 ENCOUNTER — Ambulatory Visit (INDEPENDENT_AMBULATORY_CARE_PROVIDER_SITE_OTHER): Payer: Medicare HMO | Admitting: Pulmonary Disease

## 2021-07-01 ENCOUNTER — Encounter: Payer: Self-pay | Admitting: Pulmonary Disease

## 2021-07-01 DIAGNOSIS — J849 Interstitial pulmonary disease, unspecified: Secondary | ICD-10-CM

## 2021-07-01 NOTE — Patient Instructions (Signed)
We will schedule you for high-res CT and labs for interstitial lung disease Follow-up in 1 to 2 months for

## 2021-07-01 NOTE — Progress Notes (Unsigned)
Rachael [name]    [id number]    April 11, 1933  Primary Care Physician:Fry, Ishmael Holter, MD  Referring Physician: [name], MD [place],  [place] [address]  Chief complaint:  ***  HPI:  ***  Pets: Occupation: Exposures: Smoking history: Travel history: Relevant family history:   Outpatient Encounter Medications as of 07/01/2021  Medication Sig   OVER THE COUNTER MEDICATION Take 1 Dose by mouth daily. Juice Plus Berry Blend   albuterol (VENTOLIN HFA) 108 (90 Base) MCG/ACT inhaler Inhale 2 puffs into the lungs every 4 (four) hours as needed for wheezing or shortness of breath.   aspirin 81 MG EC tablet Take one every other day (Patient taking differently: Take 81 mg by mouth every other day.)   atorvastatin (LIPITOR) 80 MG tablet Take 1 tablet (80 mg total) by mouth daily.   budesonide-formoterol (SYMBICORT) 80-4.5 MCG/ACT inhaler Inhale 2 puffs into the lungs 2 (two) times daily.   Cholecalciferol (VITAMIN D3 PO) Take 1 capsule by mouth daily.   Cyanocobalamin (B-12 PO) Take 1 tablet by mouth daily.    dapagliflozin propanediol (FARXIGA) 10 MG TABS tablet Take 10 mg by mouth daily.   furosemide (LASIX) 20 MG tablet Take one tablet once a day (Patient taking differently: Take 20 mg by mouth daily.)   levothyroxine (SYNTHROID) 112 MCG tablet Take 1 tablet (112 mcg total) by mouth daily before breakfast.   LORazepam (ATIVAN) 0.5 MG tablet Take 1 tablet (0.5 mg total) by mouth daily as needed for anxiety (to relax breathing). (Patient not taking: Reported on 07/01/2021)   predniSONE (DELTASONE) 5 MG tablet Take 0.5 tablets (2.5 mg total) by mouth daily with breakfast.   [DISCONTINUED] OVER THE COUNTER MEDICATION Take 1 Dose by mouth daily. Juice Plus Omega Blend   [DISCONTINUED] OVER THE COUNTER MEDICATION Take 1 Dose by mouth daily. Juice Plus Vegetable Blend   [DISCONTINUED] OVER THE COUNTER MEDICATION Take 1 Dose by mouth daily. Juice Plus Fruit Blend    No facility-administered encounter medications on file as of 07/01/2021.    Allergies as of 07/01/2021 - Review Complete 07/01/2021  Allergen Reaction Noted   Shellfish allergy Anaphylaxis and Hives 08/19/2010   Percocet [oxycodone-acetaminophen] Hives 08/19/2010   Iodinated contrast media Other (See Comments) 06/13/2012   Iodine Hives 06/03/2011   Ultram [tramadol hcl]  08/25/2016    Past Medical History:  Diagnosis Date   Anxiety state 11/14/2007   Qualifier: Diagnosis of  By: Jenny Reichmann MD, Hunt Oris    Breast cancer [facility])    R   CAROTID STENOSIS    Chronic venous insufficiency    CONTACT DERMATITIS    DUPUYTREN'S CONTRACTURE    Edema    pt  states had edema of feet and legs has been on lasix per dr. to help   GLUCOSE INTOLERANCE    HLD (hyperlipidemia)    Hypothyroidism    Dr. Ala Dach in [place]    Obesity    Osteoarthritis    bilat hips, severe   PVD (peripheral vascular disease) ([place])    mild carotid 11/05   VITAMIN D DEFICIENCY     Past Surgical History:  Procedure Laterality Date   2D echo  11/29/2003   normal LV size. normal EF    BACK SURGERY  12/06/2016   L3-L5 laminectomies per Dr. Maryellen Pile at [place]  05/11/2011   right breast  BREAST LUMPECTOMY     right breast   CHOLECYSTECTOMY     FINGER SURGERY     HERNIA REPAIR     incisional hernia   INGUINAL HERNIA REPAIR     JOINT REPLACEMENT Bilateral    Dr. Maureen Ralphs   normal caronary arteries     per pt. 2003   right rotator cuff surgery     [place]  05/2008 and 10/2008   B hip - alusio    Family History  Problem Relation Age of Onset   Uterine cancer Mother 52   Cancer Mother    Ovarian cancer Mother    Heart disease Father        before age 32   Varicose Veins Father    Heart disease Brother        before age 29   Prostate cancer Maternal Uncle        diagnosed in his 57s    Prostate cancer Paternal Uncle        diagnosed in his 55s   Clotting disorder Maternal Grandmother    Pancreatic cancer Maternal Grandfather        diagnosed in his 40s   Breast cancer Daughter 11       BRCA negative (no BART)   Cancer Daughter    Diabetes Son    Colon cancer Cousin    Leukemia Cousin    Uterine cancer Cousin    Prostate cancer Cousin    Coronary artery disease Other        family hx - female 1st degree relative <60   Hypothyroidism Other        aunt    Anesthesia problems Neg Hx    Adrenal disorder Neg Hx    Esophageal cancer Neg Hx    Stomach cancer Neg Hx     Social History   Socioeconomic History   Marital status: Divorced    Spouse name: Not on file   Number of children: 5   Years of education: 1 year college   Highest education level: High school graduate  Occupational History   Not on file  Tobacco Use   Smoking status: Never    Passive exposure: Past   Smokeless tobacco: Never  Vaping Use   Vaping Use: Never used  Substance and Sexual Activity   Alcohol use: No    Alcohol/week: 0.0 standard drinks of alcohol   Drug use: No   Sexual activity: Not Currently  Other Topics Concern   Not on file  Social History Narrative   Divorced; works part time   5 children   Social Determinants of Health   Financial Resource Strain: Low Risk  (05/19/2021)   Overall Financial Resource Strain (CARDIA)    Difficulty of Paying Living Expenses: Not hard at all  Food Insecurity: No Food Insecurity (05/19/2021)   Hunger Vital Sign    Worried About Running Out of Food in the Last Year: Never true    Ran Out of Food in the Last Year: Never true  Transportation Needs: No Transportation Needs (05/19/2021)   PRAPARE - Hydrologist (Medical): No    Lack of Transportation (Non-Medical): No  Physical Activity: Sufficiently Active (05/19/2021)   Exercise Vital Sign    Days of Exercise per Week: 7 days    Minutes of Exercise per Session:  30 min  Stress: No Stress Concern Present (05/19/2021)  [employer] - Occupational Stress Questionnaire    Feeling of Stress : Not at all  Social Connections: Moderately Isolated (05/13/2020)   Social Connection and Isolation Panel [NHANES]    Frequency of Communication with Friends and Family: More than three times a week    Frequency of Social Gatherings with Friends and Family: More than three times a week    Attends Religious Services: More than 4 times per year    Active Member of [employer] or Organizations: No    Attends Archivist Meetings: Never    Marital Status: Divorced  Human resources officer Violence: Not At Risk (05/13/2020)   Humiliation, Afraid, Rape, and Kick questionnaire    Fear of Current or Ex-Partner: No    Emotionally Abused: No    Physically Abused: No    Sexually Abused: No    Review of systems: Review of Systems  Constitutional: Negative for fever and chills.  HENT: Negative.   Eyes: Negative for blurred vision.  Respiratory: as per HPI  Cardiovascular: Negative for chest pain and palpitations.  Gastrointestinal: Negative for vomiting, diarrhea, blood per rectum. Genitourinary: Negative for dysuria, urgency, frequency and hematuria.  Musculoskeletal: Negative for myalgias, back pain and joint pain.  Skin: Negative for itching and rash.  Neurological: Negative for dizziness, tremors, focal weakness, seizures and loss of consciousness.  Endo/Heme/Allergies: Negative for environmental allergies.  Psychiatric/Behavioral: Negative for depression, suicidal ideas and hallucinations.  All other systems reviewed and are negative.  Physical Exam: Blood pressure 132/82, pulse 61, temperature 98.3 F (36.8 C), height 5' 7"  (1.702 m), weight 217 lb 3.2 oz (98.5 kg), SpO2 99 %. Gen:      No acute distress HEENT:  EOMI, sclera anicteric Neck:     No masses; no thyromegaly Lungs:    Clear to auscultation bilaterally; normal respiratory  effort*** CV:         Regular rate and rhythm; no murmurs Abd:      + bowel sounds; soft, non-tender; no palpable masses, no distension Ext:    No edema; adequate peripheral perfusion Skin:      Warm and dry; no rash Neuro: alert and oriented x 3 Psych: normal mood and affect  Data Reviewed: Imaging:  PFTs:  Labs:  Assessment:    Plan/Recommendations: *** This appointment required *** minutes of patient care (this includes precharting, chart review, review of results, face-to-face care, etc.).  Marshell Garfinkel MD [place] [facility] 07/01/2021, 3:52 PM  CC: [name], MD

## 2021-07-02 ENCOUNTER — Telehealth: Payer: Self-pay | Admitting: Pulmonary Disease

## 2021-07-02 DIAGNOSIS — R0600 Dyspnea, unspecified: Secondary | ICD-10-CM

## 2021-07-02 NOTE — Telephone Encounter (Signed)
Called and spoke with patient. Patient stated that she wanted to d/c her oxygen and that she also talked to Dr. [name] about it yesterday and he is aware.   Order has been placed to D/C oxygen.   Nothing further needed.

## 2021-07-08 LAB — HYPERSENSITIVITY PNEUMONITIS
A. Pullulans Abs: NEGATIVE
A.Fumigatus #1 Abs: NEGATIVE
Micropolyspora faeni, IgG: NEGATIVE
Pigeon Serum Abs: NEGATIVE
Thermoact. Saccharii: NEGATIVE
Thermoactinomyces vulgaris, IgG: NEGATIVE

## 2021-07-08 LAB — SJOGREN'S SYNDROME ANTIBODS(SSA + SSB)
SSA (Ro) (ENA) Antibody, IgG: <1 AI
SSB (La) (ENA) Antibody, IgG: <1 AI

## 2021-07-08 LAB — ANTI-SCLERODERMA ANTIBODY: Scleroderma (Scl-70) (ENA) Antibody, IgG: <1 AI

## 2021-07-08 LAB — ANA,IFA RA DIAG PNL W/RFLX TIT/PATN
Anti Nuclear Antibody (ANA): POSITIVE — AB
Cyclic Citrullin Peptide Ab: <16 UNITS
Rheumatoid fact SerPl-aCnc: <14 IU/mL (ref <14)

## 2021-07-08 LAB — ANTI-NUCLEAR AB-TITER (ANA TITER): ANA Titer 1: 1:80 {titer} — ABNORMAL HIGH

## 2021-07-08 LAB — ANCA SCREEN W REFLEX TITER: ANCA SCREEN: NEGATIVE

## 2021-07-13 ENCOUNTER — Encounter: Payer: Self-pay | Admitting: Family Medicine

## 2021-07-13 ENCOUNTER — Ambulatory Visit (INDEPENDENT_AMBULATORY_CARE_PROVIDER_SITE_OTHER): Payer: Medicare HMO | Admitting: Family Medicine

## 2021-07-13 VITALS — BP 118/60 | HR 72 | Temp 98.6°F | Wt 211.0 lb

## 2021-07-13 DIAGNOSIS — N3941 Urge incontinence: Secondary | ICD-10-CM | POA: Diagnosis not present

## 2021-07-13 MED ORDER — OXYBUTYNIN CHLORIDE ER 5 MG PO TB24
5.0000 mg | ORAL_TABLET | Freq: Every day | ORAL | 2 refills | Status: DC
Start: 2021-07-13 — End: 2021-10-01

## 2021-07-13 NOTE — Progress Notes (Signed)
   Subjective:    Patient ID: Rachael [name], female    DOB: Jun 06, 1933, 88 y.o.   MRN: [id number]  HPI Here asking for help with urinary incontinence. She takes Furosemide every day and this make her have to urinate frequently. Now over the past few months she has experienced an intense urge to urinate, and she often cannot get to the bathroom in time before she begins to urinate. There is no discomfort. She has started wearing protective undergarments.    Review of Systems  Constitutional: Negative.   Respiratory: Negative.    Cardiovascular: Negative.   Genitourinary:  Positive for frequency and urgency. Negative for difficulty urinating, dysuria, flank pain and hematuria.       Objective:   Physical Exam Constitutional:      Appearance: Normal appearance.  Cardiovascular:     Rate and Rhythm: Normal rate and regular rhythm.     Pulses: Normal pulses.     Heart sounds: Normal heart sounds.  Pulmonary:     Effort: Pulmonary effort is normal.     Breath sounds: Normal breath sounds.  Neurological:     Mental Status: She is alert.           Assessment & Plan:  Urinary incontinence. She will try Oxybutynin LA 5 mg at bedtime. Per her request we will also refer her to Urology.  Alysia Penna, MD

## 2021-07-22 ENCOUNTER — Encounter (HOSPITAL_COMMUNITY): Payer: Self-pay

## 2021-07-22 ENCOUNTER — Ambulatory Visit (HOSPITAL_COMMUNITY)
Admission: RE | Admit: 2021-07-22 | Discharge: 2021-07-22 | Disposition: A | Discharge status: Home / Self Care | Payer: Medicare HMO | Source: Ambulatory Visit | Attending: Pulmonary Disease | Admitting: Pulmonary Disease

## 2021-07-22 DIAGNOSIS — Q2547 Right aortic arch: Secondary | ICD-10-CM | POA: Diagnosis not present

## 2021-07-22 DIAGNOSIS — J849 Interstitial pulmonary disease, unspecified: Secondary | ICD-10-CM | POA: Diagnosis not present

## 2021-07-22 DIAGNOSIS — J84112 Idiopathic pulmonary fibrosis: Secondary | ICD-10-CM | POA: Diagnosis not present

## 2021-07-22 DIAGNOSIS — J479 Bronchiectasis, uncomplicated: Secondary | ICD-10-CM | POA: Diagnosis not present

## 2021-07-22 DIAGNOSIS — I712 Thoracic aortic aneurysm, without rupture, unspecified: Secondary | ICD-10-CM | POA: Diagnosis not present

## 2021-07-22 LAB — MYOMARKER 3 PLUS PROFILE (RDL)

## 2021-07-30 DIAGNOSIS — Z85828 Personal history of other malignant neoplasm of skin: Secondary | ICD-10-CM | POA: Diagnosis not present

## 2021-07-30 DIAGNOSIS — L821 Other seborrheic keratosis: Secondary | ICD-10-CM | POA: Diagnosis not present

## 2021-07-30 DIAGNOSIS — D225 Melanocytic nevi of trunk: Secondary | ICD-10-CM | POA: Diagnosis not present

## 2021-08-05 DIAGNOSIS — N952 Postmenopausal atrophic vaginitis: Secondary | ICD-10-CM | POA: Diagnosis not present

## 2021-08-05 DIAGNOSIS — N3941 Urge incontinence: Secondary | ICD-10-CM | POA: Diagnosis not present

## 2021-08-05 DIAGNOSIS — R3915 Urgency of urination: Secondary | ICD-10-CM | POA: Diagnosis not present

## 2021-08-11 ENCOUNTER — Ambulatory Visit: Payer: Medicare HMO | Admitting: Pulmonary Disease

## 2021-08-12 ENCOUNTER — Ambulatory Visit: Payer: Medicare HMO

## 2021-08-12 DIAGNOSIS — R0683 Snoring: Secondary | ICD-10-CM

## 2021-08-12 DIAGNOSIS — G4733 Obstructive sleep apnea (adult) (pediatric): Secondary | ICD-10-CM | POA: Diagnosis not present

## 2021-08-12 DIAGNOSIS — J849 Interstitial pulmonary disease, unspecified: Secondary | ICD-10-CM

## 2021-08-14 DIAGNOSIS — G4733 Obstructive sleep apnea (adult) (pediatric): Secondary | ICD-10-CM | POA: Diagnosis not present

## 2021-08-17 ENCOUNTER — Telehealth: Payer: Self-pay | Admitting: Nurse Practitioner

## 2021-08-18 ENCOUNTER — Telehealth: Payer: Self-pay

## 2021-08-18 ENCOUNTER — Other Ambulatory Visit: Payer: Self-pay

## 2021-08-18 ENCOUNTER — Telehealth: Payer: Self-pay | Admitting: Family Medicine

## 2021-08-18 MED ORDER — COLCHICINE 0.6 MG PO TABS: 0.6000 mg | ORAL_TABLET | Freq: Four times a day (QID) | ORAL | 1 refills | Status: DC | PRN

## 2021-08-18 NOTE — Telephone Encounter (Signed)
--  Caller states her hands are swollen, pain, appt are fully today. Caller has Gout. Caller is not on any medication for Gout at this time. States that she is having a lot of pain.  08/18/2021 11:42:05 AM See HCP within 4 Hours (or PCP triage) Rolin Barry, RN, Levada Dy  Comments User: Saverio Danker, RN Date/Time Eilene Ghazi Time): 08/18/2021 11:44:12 AM Office is closed for meeting, no answer at the backline. Caller is going to have her daughter drive her to the UC to be seen. If she cannot reach her daughter before 1pm , she will call back to the office to see if they can work her in. Caller given 2 UC in the area.  Referrals GO TO FACILITY UNDECIDED  Pt also has appt with Dr Sarajane Jews on 08/19/21

## 2021-08-18 NOTE — Telephone Encounter (Signed)
Patient previously prescribed Colchicine 0.6 mg tabs.   Pharmacy updated.

## 2021-08-18 NOTE — Telephone Encounter (Signed)
Pt called into the office stating that she has gout in right hand and is needing relief. She stated Dr. Sarajane Jews sent a prescription in prior that worked and she wants to know if this can be sent again

## 2021-08-18 NOTE — Telephone Encounter (Signed)
Pt daughter call and stated pt need something call in at  [facility] # [address], [place] for the gout Phone:  [phone]  Fax:  [phone]

## 2021-08-18 NOTE — Telephone Encounter (Signed)
Dr. Huston Foley pt getting medication. Medication sent to pharmacy

## 2021-08-19 ENCOUNTER — Ambulatory Visit (INDEPENDENT_AMBULATORY_CARE_PROVIDER_SITE_OTHER): Payer: Medicare HMO | Admitting: Family Medicine

## 2021-08-19 ENCOUNTER — Encounter: Payer: Self-pay | Admitting: Family Medicine

## 2021-08-19 VITALS — BP 108/68 | HR 51 | Temp 98.5°F | Wt 208.0 lb

## 2021-08-19 DIAGNOSIS — I77810 Thoracic aortic ectasia: Secondary | ICD-10-CM | POA: Diagnosis not present

## 2021-08-19 DIAGNOSIS — J849 Interstitial pulmonary disease, unspecified: Secondary | ICD-10-CM | POA: Diagnosis not present

## 2021-08-19 DIAGNOSIS — I2510 Atherosclerotic heart disease of native coronary artery without angina pectoris: Secondary | ICD-10-CM | POA: Diagnosis not present

## 2021-08-19 DIAGNOSIS — M10031 Idiopathic gout, right wrist: Secondary | ICD-10-CM

## 2021-08-19 DIAGNOSIS — R911 Solitary pulmonary nodule: Secondary | ICD-10-CM

## 2021-08-19 NOTE — Progress Notes (Signed)
   Subjective:    Patient ID: Rachael [name], female    DOB: 12/14/1933, 88 y.o.   MRN: [id number]  HPI Here with her son for several issues. First she started to have another gout attack in the right wrist yesterday, and she contacted [place]. We sent in a refill for Colchicine, and she has taken several doses. She says it has already improved quite a bit and has less pain. The other issue is she had a high resolution chest CT on 07-22-21 ordered by Dr. [name], her pulmonologist. This was ordered to investigate her SOB among other things. They have not heard any results form this and they ask me to go over them. The report detailed four basic problems. First it showed interstitial lung disease, and this is the main cause of her SOB. It also showed evidence of coronary atherosclerosis. I told her that this should not be much of a problem because she had a myocardial perfusion study on 05-13-21 which showed normal coronary circulation. She is also already on a statin. The third issue is a 0.4 cm nodule in the right upper lobe. This had not changed since a scan 2 months prior, and the recommendation was to do another scan in 3-6 months to assess stability. The final issue was a mild dilation to the thoracic aorta of 4.1 cm. The recommendation was to get a follow up scan in one year.    Review of Systems  Constitutional: Negative.   Respiratory:  Positive for cough and shortness of breath. Negative for wheezing.   Cardiovascular: Negative.   Musculoskeletal:  Positive for arthralgias.       Objective:   Physical Exam Constitutional:      Appearance: Normal appearance.     Comments: Using her walker   Cardiovascular:     Rate and Rhythm: Normal rate and regular rhythm.     Pulses: Normal pulses.     Heart sounds: Normal heart sounds.  Pulmonary:     Effort: Pulmonary effort is normal.     Breath sounds: Normal breath sounds.  Musculoskeletal:     Comments: The right wrist is swollen, red, warm, and  quite tender   Neurological:     Mental Status: She is alert.           Assessment & Plan:  She has an acute gout flare in the right wrist, and she will continue to treat this with colchicine. I went over th results of the chest CT with them as above. She will follow up with Dr. [name] on 08-31-21. We spent a total of ( 35  ) minutes reviewing records and discussing these issues.  Alysia Penna, MD

## 2021-08-24 ENCOUNTER — Other Ambulatory Visit: Payer: Self-pay | Admitting: Family Medicine

## 2021-08-24 NOTE — Telephone Encounter (Signed)
ATC patient. Per DPR, left detailed vm letting patient know results of sleep study. Advised patient to call us back to set up a f/u appointment with Joellen Jersey cobb to discuss treatment options.   Nothing further needed.

## 2021-08-26 ENCOUNTER — Other Ambulatory Visit: Payer: Self-pay | Admitting: Cardiology

## 2021-08-31 ENCOUNTER — Encounter: Payer: Self-pay | Admitting: Pulmonary Disease

## 2021-08-31 ENCOUNTER — Ambulatory Visit: Payer: Medicare HMO | Admitting: Pulmonary Disease

## 2021-08-31 VITALS — BP 124/60 | HR 58 | Temp 98.2°F | Ht 66.0 in | Wt 207.0 lb

## 2021-08-31 DIAGNOSIS — G4733 Obstructive sleep apnea (adult) (pediatric): Secondary | ICD-10-CM

## 2021-08-31 DIAGNOSIS — J849 Interstitial pulmonary disease, unspecified: Secondary | ICD-10-CM | POA: Diagnosis not present

## 2021-08-31 NOTE — Patient Instructions (Signed)
I have reviewed his CT scan which shows scarring in the lung.  This condition is likely from a condition called idiopathic pulmonary fibrosis. We will start you on a medication called Esbriet after insurance approval  We will also order an auto CPAP for treatment of sleep apnea Follow-up in 3 months

## 2021-08-31 NOTE — Progress Notes (Signed)
REMEDY CORPORAN    [id number]    11/14/1933  Primary Care Physician:Fry, Ishmael Holter, MD  Referring Physician: [name], MD [address] [place],  [place] [address]  Chief complaint: Follow up for interstitial lung disease  HPI: 88 y.o. with history of hyperlipidemia, hypothyroidism, diastolic CHF, CKD, anxiety disorder Referred for evaluation of abnormal CT chest from June 2023 showing interstitial lung disease She has mild dyspnea on exertion, denies any cough, sputum production, fevers or chills History notable for COVID-19 in December 2021 and was in an hospital for 1 day. Did not require supplemental O2  She had a hospitalization in June 2023 for acute respiratory failure with CTA showing no clot.  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  No feather pillows or comforters ILD questionnaire 07/07/2021-negative Smoking history: Non smoker Travel history: No significant travel Relevant family history: No family history of lung disease  Interim history: Here for review of CT scan.  States that breathing is stable  Outpatient Encounter Medications as of 08/31/2021  Medication Sig   albuterol (VENTOLIN HFA) 108 (90 Base) MCG/ACT inhaler Inhale 2 puffs into the lungs every 4 (four) hours as needed for wheezing or shortness of breath.   aspirin 81 MG EC tablet Take one every other day (Patient taking differently: Take 81 mg by mouth every other day.)   atorvastatin (LIPITOR) 80 MG tablet Take 1 tablet (80 mg total) by mouth daily.   Cholecalciferol (VITAMIN D3 PO) Take 1 capsule by mouth daily.   colchicine 0.6 MG tablet Take 1 tablet (0.6 mg total) by mouth every 6 (six) hours as needed (gout).   Cyanocobalamin (B-12 PO) Take 1 tablet  by mouth daily.    dapagliflozin propanediol (FARXIGA) 10 MG TABS tablet Take 10 mg by mouth daily.   furosemide (LASIX) 20 MG tablet Take one tablet once a day (Patient taking differently: Take 20 mg by mouth daily.)   levothyroxine (SYNTHROID) 112 MCG tablet Take 1 tablet (112 mcg total) by mouth daily before breakfast.   OVER THE COUNTER MEDICATION Take 1 Dose by mouth daily. Juice Plus Berry Blend   oxybutynin (DITROPAN-XL) 5 MG 24 hr tablet Take 1 tablet (5 mg total) by mouth at bedtime.   predniSONE (DELTASONE) 5 MG tablet TAKE ONE TABLET BY MOUTH ONE TIME DAILY WITH BREAKFAST (Patient taking differently: 2.'5mg'$  daily)   budesonide-formoterol (SYMBICORT) 80-4.5 MCG/ACT inhaler Inhale 2 puffs into the lungs 2 (two) times daily. (Patient not taking: Reported on 08/31/2021)   LORazepam (ATIVAN) 0.5 MG tablet Take 1 tablet (0.5 mg total) by mouth daily as needed for anxiety (to relax breathing). (Patient not taking: Reported on 08/31/2021)   No facility-administered encounter medications on file as of 08/31/2021.   Physical Exam: Blood pressure 124/60, pulse (!) 58, temperature 98.2 F (36.8 C), temperature source Oral, height '5\' 6"'$  (1.676 m), weight 207 lb (93.9 kg), SpO2 94 %. Gen:      No acute distress HEENT:  EOMI, sclera anicteric Neck:     No masses; no thyromegaly Lungs:    Clear to auscultation bilaterally; normal respiratory effort CV:         Regular rate and rhythm; no murmurs Abd:      +  bowel sounds; soft, non-tender; no palpable masses, no distension Ext:    No edema; adequate peripheral perfusion Skin:      Warm and dry; no rash Neuro: alert and oriented x 3 Psych: normal mood and affect   Data Reviewed: Imaging: CT chest abdomen pelvis 03/18/2015-dependent atelectasis/scarring at the base CTA 06/11/2021-subpleural reticulation, possible honeycombing at the base. High-resolution CT 07/22/2021-basilar predominant fibrotic disease and probable UIP pattern with mild progression  since 2017 I reviewed the images personally  PFTs: 06/30/2021 FVC 2.59 [93%], FEV1 2.01 [97%], F/F 77, DLCO 17.27 [84%] Normal lung function test  Labs: CTD serologies 07/01/2021-ANA 1:80, nuclear speckled, rheumatoid factor 17  Sleep: Sleep study 08/12/2021-moderate OSA with AHI 24, desaturation to 81%  Assessment:  Assessment for interstitial lung disease The scan shows mildly progressive pulmonary fibrosis and probable UIP pattern There are no signs and symptoms of connective tissue disease or exposures.  CTD serologies show borderline elevated ANA and rheumatoid factor which are nonspecific.  PFTs are normal  This is likely to be idiopathic pulmonary fibrosis.  I discussed in detail the diagnosis and treatment options with patient and her daughter and we have decided to start [place].  Hepatic panel in June was within normal limits  Sleep apnea Sleep study earlier this month shows moderate OSA with desaturation Start AutoSet CPAP  Plan/Recommendations: Start paperwork for [employer] AutoSet CPAP  Marshell Garfinkel MD [facility] 08/31/2021, 4:41 PM  CC: [name], MD

## 2021-09-02 ENCOUNTER — Other Ambulatory Visit (HOSPITAL_COMMUNITY): Payer: Self-pay

## 2021-09-02 ENCOUNTER — Telehealth: Payer: Self-pay

## 2021-09-02 ENCOUNTER — Other Ambulatory Visit: Payer: Self-pay

## 2021-09-02 DIAGNOSIS — R3914 Feeling of incomplete bladder emptying: Secondary | ICD-10-CM | POA: Diagnosis not present

## 2021-09-02 DIAGNOSIS — N3941 Urge incontinence: Secondary | ICD-10-CM | POA: Diagnosis not present

## 2021-09-02 MED ORDER — DAPAGLIFLOZIN PROPANEDIOL 10 MG PO TABS: 10.0000 mg | ORAL_TABLET | Freq: Every day | ORAL | 0 refills | Status: DC

## 2021-09-02 NOTE — Telephone Encounter (Signed)
Submitted Patient Assistance Application to [employer] for ESBRIET along with provider portion, PA and income documents. Will update patient when we receive a response.  Fax# [phone] Phone# [phone] 

## 2021-09-02 NOTE — Telephone Encounter (Signed)
Received New start paperwork for ESBRIET. Will update as we work through the benefits process.   Attempted to submit a Prior Authorization request to [facility] for PIRFENIDONE via CoverMyMeds, however request was cancelled due to pt already having an authorization on file for this medication.  Key: [id number]  Per test claim, copay for 30 days supply is $221.12  Patient can fill through [place]: [phone]    Will proceed with completing paperwork for PAP submission.

## 2021-09-03 ENCOUNTER — Ambulatory Visit: Payer: Medicare HMO | Admitting: Family Medicine

## 2021-09-09 NOTE — Telephone Encounter (Addendum)
Reached out to [place] to inquire about pt's submission status, per rep the application had not yet been reviewed. Rep went over submitted information and informed that pt would be approved for the program.   Received a fax from [place] regarding an approval for [place] patient assistance from 09/09/2021 until patient no longer qualifies due to discontinuation of therapy, changes to patient's current financial status or health insurance and/or patient no longer meets the program eligibility requirements. Called pt and provided update and next steps, phone numbers have been provided. Encouraged pt to reach out if she has any additional questions or concerns. Pt verbalized understanding to all. Relevant Documents have been sent to scan center.  [employer] Phone#: [phone] option 5 [employer] Phone#: [phone]

## 2021-09-11 ENCOUNTER — Telehealth: Payer: Self-pay

## 2021-09-11 NOTE — Telephone Encounter (Signed)
Caller states she has venous insufficiency. There is a red spot about 2 inches above her right ankle. The spot is about 1/2 inch. She noticed it yesterday and she put some polysporin on it last night. This morning it looks less inflamed.  09/10/2021 12:07:07 PM Cole, RN, Laie Advice Given Per Guideline HOME CARE: * You should be able to treat this at home. REASSURANCE AND EDUCATION - MILD LOCALIZED RASH: * Cleanse the area if needed with warm water. EXPECTED COURSE: * Most of these rashes pass in 2 to 3 days. CALL BACK IF: * Rash spreads or becomes worse * Rash lasts over 1 week * You become worse CARE ADVICE given per Rash - Localized and Cause Unknown (Adult) guideline.

## 2021-09-16 ENCOUNTER — Telehealth: Payer: Self-pay | Admitting: Pharmacist

## 2021-09-16 DIAGNOSIS — Z7189 Other specified counseling: Secondary | ICD-10-CM

## 2021-09-16 DIAGNOSIS — J849 Interstitial pulmonary disease, unspecified: Secondary | ICD-10-CM

## 2021-09-16 DIAGNOSIS — Z5181 Encounter for therapeutic drug level monitoring: Secondary | ICD-10-CM

## 2021-09-16 NOTE — Telephone Encounter (Signed)
ATC patient to provide Esbriet counseling and determine status of first shipment. Unable to reach. Left VM requesting return call  Knox Saliva, PharmD, MPH, BCPS, CPP Clinical Pharmacist (Rheumatology and Pulmonology)

## 2021-09-16 NOTE — Telephone Encounter (Signed)
Esbriet telephone counseling will be notated in separate telephone encounter  Knox Saliva, PharmD, MPH, BCPS, CPP Clinical Pharmacist (Rheumatology and Pulmonology)

## 2021-09-17 NOTE — Telephone Encounter (Signed)
Patient left VM returning my call. I called patient. Unable to reach. Left VM requesting return call from her  Knox Saliva, PharmD, MPH, BCPS, CPP Clinical Pharmacist (Rheumatology and Pulmonology)

## 2021-09-18 MED ORDER — PIRFENIDONE 267 MG PO TABS: 801.0000 mg | ORAL_TABLET | Freq: Three times a day (TID) | ORAL | 2 refills | Status: DC

## 2021-09-18 MED ORDER — PIRFENIDONE 267 MG PO TABS: ORAL_TABLET | ORAL | 0 refills | Status: DC

## 2021-09-18 NOTE — Telephone Encounter (Signed)
Subjective:  Patient called today by [facility] pharmacy team for Esbriet new start counseling.   Patient was last seen by Dr. [name] on 08/31/21.  Pertinent past medical history includes IPF, hyperlipidemia, hypothyroidism, diastolic CHF, CKD, anxiety. She reports no history of GERD or baseline nausea/diarrhea.  Patient reports she received Esbriet last week. She has already started and completed first week. She is now taking 2 tablets (516m) three times daily. Reports tolerating without issues even while traveling this past week  History of elevated LFTs: No History of diarrhea, nausea, vomiting: No  Objective: Allergies  Allergen Reactions   Shellfish Allergy Anaphylaxis and Hives    Hives all over the body. Hives all over the body   Percocet [Oxycodone-Acetaminophen] Hives   Iodinated Contrast Media Other (See Comments)    PT IS NOT AWARE OF IODINE ALLERGY, PREMEDICATED FOR PRIOR PREMEDS ONLY//A.C. - unknown reaction   Iodine Hives   Ultram [Tramadol Hcl]     Outpatient Encounter Medications as of 09/16/2021  Medication Sig   albuterol (VENTOLIN HFA) 108 (90 Base) MCG/ACT inhaler Inhale 2 puffs into the lungs every 4 (four) hours as needed for wheezing or shortness of breath.   aspirin 81 MG EC tablet Take one every other day (Patient taking differently: Take 81 mg by mouth every other day.)   atorvastatin (LIPITOR) 80 MG tablet Take 1 tablet (80 mg total) by mouth daily.   budesonide-formoterol (SYMBICORT) 80-4.5 MCG/ACT inhaler Inhale 2 puffs into the lungs 2 (two) times daily. (Patient not taking: Reported on 08/31/2021)   Cholecalciferol (VITAMIN D3 PO) Take 1 capsule by mouth daily.   colchicine 0.6 MG tablet Take 1 tablet (0.6 mg total) by mouth every 6 (six) hours as needed (gout).   Cyanocobalamin (B-12 PO) Take 1 tablet by mouth daily.    dapagliflozin propanediol (FARXIGA) 10 MG TABS tablet Take 1 tablet (10 mg total) by mouth daily.   furosemide (LASIX) 20 MG  tablet Take one tablet once a day (Patient taking differently: Take 20 mg by mouth daily.)   levothyroxine (SYNTHROID) 112 MCG tablet Take 1 tablet (112 mcg total) by mouth daily before breakfast.   LORazepam (ATIVAN) 0.5 MG tablet Take 1 tablet (0.5 mg total) by mouth daily as needed for anxiety (to relax breathing). (Patient not taking: Reported on 08/31/2021)   OVER THE COUNTER MEDICATION Take 1 Dose by mouth daily. Juice Plus Berry Blend   oxybutynin (DITROPAN-XL) 5 MG 24 hr tablet Take 1 tablet (5 mg total) by mouth at bedtime.   predniSONE (DELTASONE) 5 MG tablet TAKE ONE TABLET BY MOUTH ONE TIME DAILY WITH BREAKFAST (Patient taking differently: 2.515mdaily)   No facility-administered encounter medications on file as of 09/16/2021.     Immunization History  Administered Date(s) Administered   PPD Test 03/20/2015   Td 02/11/1988, 11/14/2007, 02/22/2014   Zoster Recombinat (Shingrix) 05/10/2017, 08/23/2017    PFT's TLC  Date Value Ref Range Status  01/01/2021 5.54 L Final    CMP     Component Value Date/Time   NA 139 06/11/2021 0719   NA 142 05/12/2021 1259   NA 143 10/15/2015 0943   K 3.9 06/11/2021 0719   K 4.1 10/15/2015 0943   CL 108 06/11/2021 0719   CL 104 06/20/2012 1421   CO2 21 (L) 06/11/2021 0719   CO2 28 10/15/2015 0943   GLUCOSE 158 (H) 06/11/2021 0719   GLUCOSE 94 10/15/2015 0943   GLUCOSE 107 (H) 06/20/2012 1421   BUN  16 06/11/2021 0719   BUN 18 05/12/2021 1259   BUN 21.4 10/15/2015 0943   CREATININE 1.01 (H) 06/11/2021 0719   CREATININE 1.21 (H) 12/26/2020 1511   CREATININE 0.8 10/15/2015 0943   CALCIUM 9.8 06/11/2021 0719   CALCIUM 10.3 10/15/2015 0943   PROT 6.3 (L) 06/11/2021 0719   PROT 6.0 02/19/2021 1602   PROT 7.1 10/15/2015 0943   ALBUMIN 3.0 (L) 06/11/2021 0719   ALBUMIN 3.9 02/19/2021 1602   ALBUMIN 3.5 10/15/2015 0943   AST 24 06/11/2021 0719   AST 16 10/15/2015 0943   ALT 15 06/11/2021 0719   ALT 12 10/15/2015 0943   ALKPHOS 67  06/11/2021 0719   ALKPHOS 77 10/15/2015 0943   BILITOT 1.1 06/11/2021 0719   BILITOT 1.1 02/19/2021 1602   BILITOT 1.44 (H) 10/15/2015 0943   GFRNONAA 55 (L) 06/11/2021 0719   GFRAA >60 02/26/2018 0713    CBC    Component Value Date/Time   WBC 11.3 (H) 06/11/2021 0719   RBC 4.61 06/11/2021 0719   HGB 13.6 06/11/2021 0719   HGB 13.1 10/15/2015 0943   HCT 40.8 06/11/2021 0719   HCT 40.5 10/15/2015 0943   PLT 378 06/11/2021 0719   PLT 238 10/15/2015 0943   MCV 88.5 06/11/2021 0719   MCV 87.3 10/15/2015 0943   MCH 29.5 06/11/2021 0719   MCHC 33.3 06/11/2021 0719   RDW 14.5 06/11/2021 0719   RDW 14.8 (H) 10/15/2015 0943   LYMPHSABS 1.7 06/11/2021 0719   LYMPHSABS 2.1 10/15/2015 0943   MONOABS 0.6 06/11/2021 0719   MONOABS 1.1 (H) 10/15/2015 0943   EOSABS 0.0 06/11/2021 0719   EOSABS 0.3 10/15/2015 0943   BASOSABS 0.0 06/11/2021 0719   BASOSABS 0.0 10/15/2015 0943    LFT's    Latest Ref Rng & Units 06/11/2021    7:19 AM 06/10/2021    5:41 PM 05/20/2021    2:05 PM  Hepatic Function  Total Protein 6.5 - 8.1 g/dL 6.3  6.7  7.5   Albumin 3.5 - 5.0 g/dL 3.0  3.4  4.0   AST 15 - 41 U/L 24  18  25    ALT 0 - 44 U/L 15  15  18    Alk Phosphatase 38 - 126 U/L 67  73  74   Total Bilirubin 0.3 - 1.2 mg/dL 1.1  2.8  2.4     HRCT (07/23/21) - Spectrum of findings compatible with basilar predominant fibrotic interstitial lung disease without frank honeycombing, with mild progression at the lung bases since a 2017 CT abdomen study. Findings are categorized as probable UIP per consensus guidelines  Assessment and Plan  Esbriet Medication Management Thoroughly counseled patient on the efficacy, mechanism of action, dosing, administration, adverse effects, and monitoring parameters of Esbriet.  Patient verbalized understanding.   Goals of Therapy: Will not stop or reverse the progression of ILD. It will slow the progression of ILD.   Dosing: Starting dose will be Esbriet 267 mg 1 tablet  three times daily for 7 days, then 2 tablets three times daily for 7 days, then 3 tablets three times daily.  Maintenance dose will be 801 mg 1 tablet three times daily if tolerated after 2-3 months.  Stressed the importance of taking with meals and space at least 5-6 hours apart to minimize stomach upset.   Adverse Effects: Nausea, vomiting, diarrhea, weight loss Abdominal pain GERD - reviewed signs/symptoms of GERD Sun sensitivity/rash - patient advised to wear sunscreen when exposed to sunlight, long  sleeves, and hat Dizziness Fatigue  Monitoring: Monitor for diarrhea, nausea and vomiting, GI perforation, hepatotoxicity  Monitor LFTs - baseline, monthly for first 6 months, then every 3 months routinely CBC w differential at baseline and every 3 months routinely Future order for CMET placed. Due to her CKD, CMET will have to monitored closely. If changes to renal function, patient may have to reduce maintenance dose to 2 tabs three times daily.  Access: Approval of Esbriet through: patient assistance Rx sent to: [facility], dispensing) for Esbriet: [phone] Rx for maintenance dose escribed to [facility] today.  Patient started Esbriet last week.  Medication Reconciliation A drug regimen assessment was performed, including review of allergies, interactions, disease-state management, dosing and immunization history. Medications were reviewed with the patient, including name, instructions, indication, goals of therapy, potential side effects, importance of adherence, and safe use.  No drug interactions between current medication list and Esbriet.  Patient is scheduled for f/u with Dr. [name] on 11/16/21  Thank you for involving pharmacy to assist in providing this patient's care.   Knox Saliva, PharmD, MPH, BCPS, CPP Clinical Pharmacist (Rheumatology and Pulmonology)

## 2021-09-24 DIAGNOSIS — M79671 Pain in right foot: Secondary | ICD-10-CM | POA: Diagnosis not present

## 2021-09-24 DIAGNOSIS — L84 Corns and callosities: Secondary | ICD-10-CM | POA: Diagnosis not present

## 2021-10-01 ENCOUNTER — Ambulatory Visit (INDEPENDENT_AMBULATORY_CARE_PROVIDER_SITE_OTHER): Payer: Medicare HMO | Admitting: Family Medicine

## 2021-10-01 ENCOUNTER — Telehealth: Payer: Self-pay | Admitting: Cardiology

## 2021-10-01 ENCOUNTER — Encounter: Payer: Self-pay | Admitting: Family Medicine

## 2021-10-01 VITALS — BP 110/60 | HR 54 | Temp 98.1°F | Wt 207.0 lb

## 2021-10-01 DIAGNOSIS — J449 Chronic obstructive pulmonary disease, unspecified: Secondary | ICD-10-CM

## 2021-10-01 DIAGNOSIS — J209 Acute bronchitis, unspecified: Secondary | ICD-10-CM

## 2021-10-01 DIAGNOSIS — J440 Chronic obstructive pulmonary disease with acute lower respiratory infection: Secondary | ICD-10-CM | POA: Diagnosis not present

## 2021-10-01 DIAGNOSIS — J8410 Pulmonary fibrosis, unspecified: Secondary | ICD-10-CM | POA: Diagnosis not present

## 2021-10-01 MED ORDER — AZITHROMYCIN 250 MG PO TABS: ORAL_TABLET | ORAL | 0 refills | Status: DC

## 2021-10-01 NOTE — Progress Notes (Signed)
   Subjective:    Patient ID: Rachael [name], female    DOB: 31-Dec-1933, 88 y.o.   MRN: [id number]  HPI Here for one month of intermittent coughing up yellow sputum. No fever or chest pains. No more SOB than what she has at baseline. Using her inhaler occasionally. She was started on CPAP by her pulmonologist, Dr. [name], recently for sleep apnea and ILD, but says she cannot sleep while using it.    Review of Systems  Constitutional: Negative.   HENT: Negative.    Eyes: Negative.   Respiratory:  Positive for cough and shortness of breath. Negative for choking and wheezing.   Cardiovascular: Negative.        Objective:   Physical Exam Constitutional:      Appearance: She is not ill-appearing.     Comments: Walks with her walker   HENT:     Right Ear: Tympanic membrane, ear canal and external ear normal.     Left Ear: Tympanic membrane, ear canal and external ear normal.     Nose: Nose normal.     Mouth/Throat:     Pharynx: Oropharynx is clear.  Eyes:     Conjunctiva/sclera: Conjunctivae normal.  Cardiovascular:     Rate and Rhythm: Normal rate and regular rhythm.     Pulses: Normal pulses.     Heart sounds: Normal heart sounds.  Pulmonary:     Effort: Pulmonary effort is normal.     Breath sounds: Normal breath sounds.  Neurological:     Mental Status: She is alert.           Assessment & Plan:  Bronchitis, treat with a Zpack. She was advised to let Dr. [name] know about her CPAP problems.  Alysia Penna, MD

## 2021-10-02 ENCOUNTER — Telehealth: Payer: Self-pay | Admitting: Pulmonary Disease

## 2021-10-02 DIAGNOSIS — G4733 Obstructive sleep apnea (adult) (pediatric): Secondary | ICD-10-CM

## 2021-10-02 NOTE — Telephone Encounter (Signed)
Called and spoke with pt who states she received her cpap machine about a month but did not start using it until about 4 days ago. States she has had a hard time with it. Pt stated that the mask has not been fitting well and states that last night was a little better for her but she had to hold the mask in place.  Spoke with Dr. Silas Flood who recommended pt going the weekend without using the machine and then try to see if DME can do a mask fit next week. Stated that info to pt and she verbalized understanding. Have placed order to [facility] for them to do a mask fit.nothing further needed.

## 2021-10-02 NOTE — Telephone Encounter (Signed)
pt states her cpap machine isnt working well for her. she states she feels like the machine is taking her breathe away instead of helping her.pt also states she feels like the nose piece sits directly ontop of her nose. pt states she  hasnt gotten any rest

## 2021-10-02 NOTE — Telephone Encounter (Signed)
Incorrect provider. Closing encounter and will create new one.

## 2021-10-06 ENCOUNTER — Ambulatory Visit: Payer: Medicare HMO | Admitting: Internal Medicine

## 2021-10-12 DIAGNOSIS — Z6834 Body mass index (BMI) 34.0-34.9, adult: Secondary | ICD-10-CM | POA: Diagnosis not present

## 2021-10-12 DIAGNOSIS — Z853 Personal history of malignant neoplasm of breast: Secondary | ICD-10-CM | POA: Diagnosis not present

## 2021-10-12 DIAGNOSIS — Z124 Encounter for screening for malignant neoplasm of cervix: Secondary | ICD-10-CM | POA: Diagnosis not present

## 2021-10-21 DIAGNOSIS — I83892 Varicose veins of left lower extremities with other complications: Secondary | ICD-10-CM | POA: Diagnosis not present

## 2021-10-26 DIAGNOSIS — L851 Acquired keratosis [keratoderma] palmaris et plantaris: Secondary | ICD-10-CM | POA: Diagnosis not present

## 2021-10-26 DIAGNOSIS — M2041 Other hammer toe(s) (acquired), right foot: Secondary | ICD-10-CM | POA: Diagnosis not present

## 2021-10-27 DIAGNOSIS — R0602 Shortness of breath: Secondary | ICD-10-CM | POA: Diagnosis not present

## 2021-10-27 DIAGNOSIS — J849 Interstitial pulmonary disease, unspecified: Secondary | ICD-10-CM | POA: Diagnosis not present

## 2021-10-27 DIAGNOSIS — Z23 Encounter for immunization: Secondary | ICD-10-CM | POA: Diagnosis not present

## 2021-10-29 ENCOUNTER — Encounter: Payer: Self-pay | Admitting: Family Medicine

## 2021-10-29 ENCOUNTER — Ambulatory Visit (INDEPENDENT_AMBULATORY_CARE_PROVIDER_SITE_OTHER): Payer: Medicare HMO | Admitting: Family Medicine

## 2021-10-29 VITALS — BP 118/64 | HR 62 | Temp 98.2°F | Wt 202.0 lb

## 2021-10-29 DIAGNOSIS — E785 Hyperlipidemia, unspecified: Secondary | ICD-10-CM | POA: Diagnosis not present

## 2021-10-29 MED ORDER — ATORVASTATIN CALCIUM 80 MG PO TABS: 40.0000 mg | ORAL_TABLET | Freq: Every day | ORAL | 3 refills | Status: DC

## 2021-10-29 NOTE — Progress Notes (Signed)
   Subjective:    Patient ID: Rachael [name], female    DOB: 07-25-1933, 88 y.o.   MRN: [id number]  HPI Here to ask my opinion about her dose of Atorvastatin. When she was in the hospitla in February, a lipid panel showed her LDL to be 83. She was taking 40 mg a day of Atorvastatin at that point, and a cardiologist increase this to 80 mg a day. Since then she has felt fatigued and has had some mild muscle aches. She saw her pulmonologist at [facility], Dr. Frazier Richards, yesterday and he said this dose was too high. She asks what we think today.    Review of Systems  Constitutional:  Positive for fatigue.  Respiratory: Negative.    Cardiovascular: Negative.   Musculoskeletal:  Positive for myalgias.       Objective:   Physical Exam Constitutional:      Appearance: Normal appearance.     Comments: Walks with her walker   Cardiovascular:     Rate and Rhythm: Normal rate and regular rhythm.     Pulses: Normal pulses.     Heart sounds: Normal heart sounds.  Pulmonary:     Effort: Pulmonary effort is normal.     Breath sounds: Normal breath sounds.  Neurological:     Mental Status: She is alert.           Assessment & Plan:  Her dyslipidemia is well controlled, in my opinion, and considering the fact that she is likely having side effects from this, we agreed to decrease the dose of Atorvastatin back to 40 mg daily. She will set up a well exam with fasting labs sometime soon. Alysia Penna, MD

## 2021-11-06 ENCOUNTER — Ambulatory Visit (INDEPENDENT_AMBULATORY_CARE_PROVIDER_SITE_OTHER): Payer: Medicare HMO | Admitting: Family Medicine

## 2021-11-06 ENCOUNTER — Encounter: Payer: Self-pay | Admitting: Family Medicine

## 2021-11-06 VITALS — BP 134/60 | HR 56 | Temp 97.7°F | Ht 66.0 in

## 2021-11-06 DIAGNOSIS — S0990XA Unspecified injury of head, initial encounter: Secondary | ICD-10-CM | POA: Diagnosis not present

## 2021-11-06 DIAGNOSIS — S0001XA Abrasion of scalp, initial encounter: Secondary | ICD-10-CM | POA: Diagnosis not present

## 2021-11-06 DIAGNOSIS — S300XXA Contusion of lower back and pelvis, initial encounter: Secondary | ICD-10-CM

## 2021-11-06 NOTE — Progress Notes (Unsigned)
Established Patient Office Visit  Subjective   Patient ID: Rachael [name], female    DOB: 25-Aug-1933  Age: 88 y.o. MRN: [id number]  Chief Complaint  Patient presents with   Fall   Head Injury    HPI  {History (Optional):23778} Rachael [name] is here accompanied by her daughter after a fall this morning when she was in [place].  She was at a conference there and was basically sitting on her walker and a friend of hers was trying to push her over the lip of a door when her walker caught and she went backwards.  She struck her head on the floor striking the occiput.  Her friend basically fell on the top of her.  She also recalls hitting her buttock fairly hard.  No loss of consciousness.  EMS was summoned and they encouraged her to go in to be checked out but she declined.  She has some soreness in the coccyx region but able to bear weight.  She had a little bit of bleeding from her occipital scalp.  She had some initial headache but essentially none now.  No nausea or vomiting.  No confusion.  No focal weakness.  No significant neck pain.  Denies any extremity injuries.  Past Medical History:  Diagnosis Date   Anxiety state 11/14/2007   Qualifier: Diagnosis of  By: Jenny Reichmann MD, Hunt Oris    Breast cancer [facility])    R   CAROTID STENOSIS    Chronic venous insufficiency    CONTACT DERMATITIS    DUPUYTREN'S CONTRACTURE    Edema    pt  states had edema of feet and legs has been on lasix per dr. to help   GLUCOSE INTOLERANCE    HLD (hyperlipidemia)    Hypothyroidism    Dr. Ala Dach in [place]    Obesity    Osteoarthritis    bilat hips, severe   PVD (peripheral vascular disease) ([place])    mild carotid 11/05   VITAMIN D DEFICIENCY    Past Surgical History:  Procedure Laterality Date   2D echo  11/29/2003   normal LV size. normal EF    BACK SURGERY  12/06/2016   L3-L5 laminectomies per Dr. Maryellen Pile at [place]  05/11/2011   right  breast   BREAST LUMPECTOMY     right breast   [place]     incisional hernia   INGUINAL HERNIA REPAIR     JOINT REPLACEMENT Bilateral    Dr. Maureen Ralphs   normal caronary arteries     per pt. 2003   right rotator cuff surgery     [place]  05/2008 and 10/2008   B hip - alusio    reports that she has never smoked. She has been exposed to tobacco smoke. She has never used smokeless tobacco. She reports that she does not drink alcohol and does not use drugs. family history includes Breast cancer (age of onset: 62) in her daughter; Cancer in her daughter and mother; Clotting disorder in her maternal grandmother; Colon cancer in her cousin; Coronary artery disease in an other family member; Diabetes in her son; Heart disease in her brother and father; Hypothyroidism in an other family member; Leukemia in her cousin; Ovarian cancer in her mother; Pancreatic cancer in her maternal grandfather; Prostate  cancer in her cousin, maternal uncle, and paternal uncle; Uterine cancer in her cousin; Uterine cancer (age of onset: 4) in her mother; Varicose Veins in her father. Allergies  Allergen Reactions   Shellfish Allergy Anaphylaxis and Hives    Hives all over the body. Hives all over the body   Percocet [Oxycodone-Acetaminophen] Hives   Iodinated Contrast Media Other (See Comments)    PT IS NOT AWARE OF IODINE ALLERGY, PREMEDICATED FOR PRIOR PREMEDS ONLY//A.C. - unknown reaction   Iodine Hives   Ultram [Tramadol Hcl]     Review of Systems  Constitutional:  Negative for chills.  Eyes:  Negative for blurred vision, double vision and photophobia.  Respiratory:  Negative for shortness of breath.   Cardiovascular:  Negative for chest pain.  Gastrointestinal:  Negative for abdominal pain, nausea and vomiting.  Genitourinary:  Negative for dysuria.  Musculoskeletal:  Negative for neck pain.  Neurological:   Negative for dizziness, speech change, focal weakness, seizures, loss of consciousness and weakness.      Objective:     BP 134/60 (BP Location: Left Arm, Patient Position: Sitting, Cuff Size: Large)   Pulse (!) 56   Temp 97.7 F (36.5 C) (Oral)   Ht '5\' 6"'$  (1.676 m)   SpO2 99%   BMI 32.60 kg/m  {Vitals History (Optional):23777}  Physical Exam Vitals reviewed.  Constitutional:      General: She is not in acute distress.    Appearance: Normal appearance.  HENT:     Head:     Comments: Has a little bit of crusted blood mid occipital area.  No significant lacerations.  Mild contusion.  Minimal swelling.  Minimally tender.    Ears:     Comments: She has some cerumen right canal.  Left is normal. Neck:     Comments: No spinal tenderness. Cardiovascular:     Rate and Rhythm: Normal rate and regular rhythm.  Pulmonary:     Effort: Pulmonary effort is normal.     Breath sounds: Normal breath sounds.  Musculoskeletal:     Cervical back: Neck supple.     Comments: She does have some bruising in the lower sacral and coccyx region.  Minimally tender to palpation.  No lumbar tenderness.  Neurological:     General: No focal deficit present.     Mental Status: She is alert.     Cranial Nerves: No cranial nerve deficit.     Motor: No weakness.      No results found for any visits on 11/06/21.  {Labs (Optional):23779}  The ASCVD Risk score (Arnett DK, et al., 2019) failed to calculate for the following reasons:   The 2019 ASCVD risk score is only valid for ages 50 to 85    Assessment & Plan:   #1 fall this morning with closed head injury.  She has superficial abrasion of the occiput scalp.  Nonfocal neuro exam at this time.  She does not have any persistent headache, vomiting, cognitive deficits, or other acute concerns.  -Head injury sheet given -Call 911 or go to ER immediately for any progressive headache, confusion, seizure, or any other focal neurologic concerns -Avoid  further use of aspirin or nonsteroidal.  May take Tylenol occasionally. -Her daughter is with her now and her son will be with her this evening  #2 contusion lower sacrum and coccyx region.  Able to bear pressure.  Consider x-rays for any persistent or progressive symptoms.  No follow-ups on file.    Carolann Littler,  MD

## 2021-11-06 NOTE — Patient Instructions (Signed)
Consider Tylenol for headache  Avoid aspirin or further Advil.    Watch for any progressive headache, recurrent vomiting, confusion, or any new neurologic symptoms

## 2021-11-09 ENCOUNTER — Ambulatory Visit: Payer: Medicare HMO | Admitting: Family Medicine

## 2021-11-10 ENCOUNTER — Encounter: Payer: Medicare HMO | Admitting: Family Medicine

## 2021-11-11 ENCOUNTER — Telehealth: Payer: Self-pay | Admitting: Family Medicine

## 2021-11-11 NOTE — Telephone Encounter (Signed)
Pt stated she is feeling fine she just wants to f/u with Dr.Fry. Pt has been scheduled.

## 2021-11-11 NOTE — Telephone Encounter (Signed)
Pt fell and hit head last Friday, called in today c/o same symptoms on 11/06/21. Triage nurse advised her to go to urgent care, patient does not want to go to urgent care. Please advise her

## 2021-11-12 NOTE — Telephone Encounter (Signed)
---  Caller states that she fell about 6 days ago. Reports that she hit her head on the floor and had a laceration. Was seen in the office 10/27. Reports that she has been having headaches.  11/11/2021 3:56:33 PM Go to ED Now (or PCP triage) Velta Addison, RN, Helene Kelp  Referrals GO TO FACILITY UNDECIDED  Pt was seen on 11/06/21 by Dr Elease Hashimoto. OV notes: Head injury sheet given -Call 911 or go to ER immediately for any progressive headache, confusion, seizure, or any other focal neurologic concerns  Pt has appt with PCP on 11/13/21

## 2021-11-12 NOTE — Telephone Encounter (Signed)
Yes I will see her tomorrow

## 2021-11-13 ENCOUNTER — Telehealth: Payer: Self-pay | Admitting: Family Medicine

## 2021-11-13 ENCOUNTER — Ambulatory Visit: Payer: Medicare HMO

## 2021-11-13 ENCOUNTER — Encounter: Payer: Self-pay | Admitting: Family Medicine

## 2021-11-13 ENCOUNTER — Ambulatory Visit (INDEPENDENT_AMBULATORY_CARE_PROVIDER_SITE_OTHER): Payer: Medicare HMO

## 2021-11-13 ENCOUNTER — Ambulatory Visit (INDEPENDENT_AMBULATORY_CARE_PROVIDER_SITE_OTHER): Payer: Medicare HMO | Admitting: Family Medicine

## 2021-11-13 VITALS — BP 132/62 | HR 51 | Temp 97.6°F | Wt 202.0 lb

## 2021-11-13 DIAGNOSIS — S0003XD Contusion of scalp, subsequent encounter: Secondary | ICD-10-CM

## 2021-11-13 DIAGNOSIS — M546 Pain in thoracic spine: Secondary | ICD-10-CM

## 2021-11-13 DIAGNOSIS — M545 Low back pain, unspecified: Secondary | ICD-10-CM | POA: Diagnosis not present

## 2021-11-13 DIAGNOSIS — Z043 Encounter for examination and observation following other accident: Secondary | ICD-10-CM | POA: Diagnosis not present

## 2021-11-13 DIAGNOSIS — H6121 Impacted cerumen, right ear: Secondary | ICD-10-CM

## 2021-11-13 MED ORDER — TRAMADOL HCL 50 MG PO TABS: 100.0000 mg | ORAL_TABLET | Freq: Four times a day (QID) | ORAL | 2 refills | Status: DC | PRN

## 2021-11-13 NOTE — Telephone Encounter (Signed)
Pt imaging results are not back yet, pt will be notified when Dr Sarajane Jews reviews results

## 2021-11-13 NOTE — Progress Notes (Signed)
   Subjective:    Patient ID: Rachael [name], female    DOB: [birth year], 88 y.o.   MRN: [id number]  HPI Here with her son for several issues. First while attending a conference in [place] on 11-06-21 she fell backwards off her walker and landed on concrete, striking the back of her head and her tailbone. There was no LOC. EMS was called and thet offered to take in to the ED but she declined. She was seen here later that day and she had a small abrasion on the occipital scalp and a tender lower back. She has been taking Tylenol but she is still in a lot of pain. She denies any headache or blurred vision or nausea. She has pain from the middle of her back to the sacrum. The other issue is she has pain and decreased hearing in the right ear.    Review of Systems  Constitutional: Negative.   HENT:  Positive for ear pain and hearing loss.   Eyes: Negative.   Respiratory: Negative.    Cardiovascular: Negative.   Musculoskeletal:  Positive for back pain.  Neurological: Negative.        Objective:   Physical Exam Constitutional:      General: She is not in acute distress.    Comments: In a wheelchair   HENT:     Right Ear: There is impacted cerumen.     Left Ear: Tympanic membrane, ear canal and external ear normal.  Eyes:     Extraocular Movements: Extraocular movements intact.     Pupils: Pupils are equal, round, and reactive to light.  Cardiovascular:     Rate and Rhythm: Normal rate and regular rhythm.     Pulses: Normal pulses.     Heart sounds: Normal heart sounds.  Pulmonary:     Effort: Pulmonary effort is normal.     Breath sounds: Normal breath sounds.  Musculoskeletal:     Comments: She is tender from the middle thoracic spine down to the coccyx. ROM is full   Skin:    Comments: Small abrasion on the occipital scalp  Neurological:     General: No focal deficit present.     Mental Status: She is alert and oriented to person, place, and time.     Cranial Nerves: No  cranial nerve deficit.     Coordination: Coordination normal.           Assessment & Plan:  She has a head contusion that is healing as expected. No evidence of concussion. She has contusions to the lower back, and we will send her for Xrays of the thoracic and lumbar spine today. She can use Tramadol for pain. As for the cerumen impaction, after informed consent was obtained, the canal was successfully irriagted clear with water. She tolerated this well, and afterwards her hearing was back to normal.  Alysia Penna, MD

## 2021-11-13 NOTE — Addendum Note (Signed)
Addended by: Alysia Penna A on: 11/13/2021 10:24 AM   Modules accepted: Orders

## 2021-11-13 NOTE — Telephone Encounter (Signed)
Pt was here today and had xray and would like to know the results

## 2021-11-16 ENCOUNTER — Ambulatory Visit: Payer: Medicare HMO | Admitting: Pulmonary Disease

## 2021-11-16 ENCOUNTER — Telehealth: Payer: Self-pay | Admitting: Family Medicine

## 2021-11-16 NOTE — Telephone Encounter (Signed)
Spoke with patient about XR results

## 2021-11-16 NOTE — Telephone Encounter (Signed)
Spoke with patient about XR results from 11/13/2021, voiced understanding.

## 2021-11-16 NOTE — Telephone Encounter (Signed)
Pt son call and stated his mother want a call back about her xray report.

## 2021-11-18 ENCOUNTER — Encounter: Payer: Medicare HMO | Admitting: Family Medicine

## 2021-11-20 ENCOUNTER — Ambulatory Visit (INDEPENDENT_AMBULATORY_CARE_PROVIDER_SITE_OTHER): Payer: Medicare HMO | Admitting: Family Medicine

## 2021-11-20 ENCOUNTER — Encounter: Payer: Self-pay | Admitting: Family Medicine

## 2021-11-20 VITALS — BP 110/68 | HR 52 | Temp 98.1°F | Wt 202.0 lb

## 2021-11-20 DIAGNOSIS — M545 Low back pain, unspecified: Secondary | ICD-10-CM

## 2021-11-20 DIAGNOSIS — E785 Hyperlipidemia, unspecified: Secondary | ICD-10-CM

## 2021-11-20 DIAGNOSIS — R200 Anesthesia of skin: Secondary | ICD-10-CM | POA: Diagnosis not present

## 2021-11-20 DIAGNOSIS — R739 Hyperglycemia, unspecified: Secondary | ICD-10-CM | POA: Diagnosis not present

## 2021-11-20 DIAGNOSIS — R202 Paresthesia of skin: Secondary | ICD-10-CM | POA: Diagnosis not present

## 2021-11-20 DIAGNOSIS — E538 Deficiency of other specified B group vitamins: Secondary | ICD-10-CM

## 2021-11-20 DIAGNOSIS — E559 Vitamin D deficiency, unspecified: Secondary | ICD-10-CM | POA: Diagnosis not present

## 2021-11-20 LAB — LIPID PANEL
Cholesterol: 127 mg/dL (ref 0–200)
HDL: 51.1 mg/dL (ref >39.00)
LDL Cholesterol: 61 mg/dL (ref 0–99)
NonHDL: 75.8
Total CHOL/HDL Ratio: 2
Triglycerides: 76 mg/dL (ref 0.0–149.0)
VLDL: 15.2 mg/dL (ref 0.0–40.0)

## 2021-11-20 LAB — CBC WITH DIFFERENTIAL/PLATELET
Basophils Absolute: 0 10*3/uL (ref 0.0–0.1)
Basophils Relative: 0.4 % (ref 0.0–3.0)
Eosinophils Absolute: 0.1 10*3/uL (ref 0.0–0.7)
Eosinophils Relative: 0.9 % (ref 0.0–5.0)
HCT: 36.7 % (ref 36.0–46.0)
Hemoglobin: 12.3 g/dL (ref 12.0–15.0)
Lymphocytes Relative: 12.6 % (ref 12.0–46.0)
Lymphs Abs: 1.6 10*3/uL (ref 0.7–4.0)
MCHC: 33.5 g/dL (ref 30.0–36.0)
MCV: 86.3 fl (ref 78.0–100.0)
Monocytes Absolute: 1.4 10*3/uL — ABNORMAL HIGH (ref 0.1–1.0)
Monocytes Relative: 11.2 % (ref 3.0–12.0)
Neutro Abs: 9.5 10*3/uL — ABNORMAL HIGH (ref 1.4–7.7)
Neutrophils Relative %: 74.9 % (ref 43.0–77.0)
Platelets: 396 10*3/uL (ref 150.0–400.0)
RBC: 4.25 Mil/uL (ref 3.87–5.11)
RDW: 14.7 % (ref 11.5–15.5)
WBC: 12.7 10*3/uL — ABNORMAL HIGH (ref 4.0–10.5)

## 2021-11-20 LAB — HEPATIC FUNCTION PANEL
ALT: 8 U/L (ref 0–35)
AST: 15 U/L (ref 0–37)
Albumin: 3.7 g/dL (ref 3.5–5.2)
Alkaline Phosphatase: 97 U/L (ref 39–117)
Bilirubin, Direct: 0.3 mg/dL (ref 0.0–0.3)
Total Bilirubin: 1.1 mg/dL (ref 0.2–1.2)
Total Protein: 6.6 g/dL (ref 6.0–8.3)

## 2021-11-20 LAB — BASIC METABOLIC PANEL
BUN: 15 mg/dL (ref 6–23)
CO2: 26 mEq/L (ref 19–32)
Calcium: 10.1 mg/dL (ref 8.4–10.5)
Chloride: 105 mEq/L (ref 96–112)
Creatinine, Ser: 0.79 mg/dL (ref 0.40–1.20)
GFR: 67.96 mL/min (ref >60.00)
Glucose, Bld: 90 mg/dL (ref 70–99)
Potassium: 4.4 mEq/L (ref 3.5–5.1)
Sodium: 141 mEq/L (ref 135–145)

## 2021-11-20 LAB — VITAMIN B12: Vitamin B-12: 1149 pg/mL — ABNORMAL HIGH (ref 211–911)

## 2021-11-20 LAB — MAGNESIUM: Magnesium: 1.5 mg/dL (ref 1.5–2.5)

## 2021-11-20 LAB — HEMOGLOBIN A1C: Hgb A1c MFr Bld: 5.9 % (ref 4.6–6.5)

## 2021-11-20 LAB — TSH: TSH: 0.4 u[IU]/mL (ref 0.35–5.50)

## 2021-11-20 LAB — VITAMIN D 25 HYDROXY (VIT D DEFICIENCY, FRACTURES): VITD: 39.25 ng/mL (ref 30.00–100.00)

## 2021-11-20 MED ORDER — HYDROCODONE-ACETAMINOPHEN 10-325 MG PO TABS: 1.0000 | ORAL_TABLET | Freq: Four times a day (QID) | ORAL | 0 refills | Status: AC | PRN

## 2021-11-20 NOTE — Progress Notes (Signed)
   Subjective:    Patient ID: Rachael [name], female    DOB: 1933-06-27, 88 y.o.   MRN: [id number]  HPI Here with her son for the sudden onset of weakness and numbness ans tingling in both legs. This started about 4 days ago. She fell on 11-06-21 on her tailbone ,and we saw her on 11-13-21. We got Xrays of the lumbar spine that day and these were negative for fractures. No symptoms in her hands or arms.    Review of Systems  Constitutional: Negative.  Negative for activity change.  Respiratory: Negative.    Cardiovascular: Negative.   Musculoskeletal:  Positive for back pain.  Neurological:  Positive for weakness and numbness.       Objective:   Physical Exam Constitutional:      Comments: In a wheelchair   Cardiovascular:     Rate and Rhythm: Normal rate and regular rhythm.     Pulses: Normal pulses.     Heart sounds: Normal heart sounds.  Pulmonary:     Effort: Pulmonary effort is normal.     Breath sounds: Normal breath sounds.  Musculoskeletal:     Comments: She is still quite tender in the lower back.   Neurological:     Mental Status: She is alert and oriented to person, place, and time.     Comments: She has decreased sensation to light touch in both lower legs and feet. Motor in the legs is 3/4.            Assessment & Plan:  She is still having pain from a contusion to the lumbar spine, and now she has numbness and weakness in the legs. I am concerned about the possibility of a herniated disc or other neurologic injury. We will set up a lumbar spine MRI asap. We will also get labs to day to look ofr any metabolic disturbances like B12 deficiency, electrolyte imbalances, etc. We spent a total of ( 33  ) minutes reviewing records and discussing these issues.  Alysia Penna, MD

## 2021-11-25 ENCOUNTER — Encounter: Payer: Medicare HMO | Admitting: Family Medicine

## 2021-11-26 ENCOUNTER — Telehealth: Payer: Self-pay | Admitting: Family Medicine

## 2021-11-26 NOTE — Telephone Encounter (Signed)
Patient cpt [phone] scheduled for 11/28/21 needs insurance auth by 3pm on 11/27/2021. Pls call contact with any issues.

## 2021-11-27 NOTE — Telephone Encounter (Signed)
Left a message for Rachael [name] with [facility] advised to call the office back

## 2021-11-27 NOTE — Telephone Encounter (Addendum)
Pt is was sch for [id number] - MR LUMBAR SPINE WO CONTRAST  on Saturday and they had cancel the procedure for Saturday. Pt call [facility] at   [phone] ask for taron

## 2021-11-27 NOTE — Telephone Encounter (Signed)
I have no idea what this means. Is there something we need to do?

## 2021-11-28 ENCOUNTER — Other Ambulatory Visit: Payer: Medicare HMO

## 2021-11-30 ENCOUNTER — Ambulatory Visit (INDEPENDENT_AMBULATORY_CARE_PROVIDER_SITE_OTHER): Payer: Medicare HMO | Admitting: Family Medicine

## 2021-11-30 ENCOUNTER — Encounter (HOSPITAL_BASED_OUTPATIENT_CLINIC_OR_DEPARTMENT_OTHER): Payer: Self-pay

## 2021-11-30 ENCOUNTER — Encounter: Payer: Self-pay | Admitting: Pulmonary Disease

## 2021-11-30 ENCOUNTER — Ambulatory Visit (HOSPITAL_BASED_OUTPATIENT_CLINIC_OR_DEPARTMENT_OTHER)
Admission: RE | Admit: 2021-11-30 | Discharge: 2021-11-30 | Disposition: A | Discharge status: Home / Self Care | Payer: Medicare HMO | Source: Ambulatory Visit | Attending: Family Medicine | Admitting: Family Medicine

## 2021-11-30 ENCOUNTER — Ambulatory Visit (HOSPITAL_BASED_OUTPATIENT_CLINIC_OR_DEPARTMENT_OTHER)
Admission: RE | Admit: 2021-11-30 | Discharge: 2021-11-30 | Disposition: A | Discharge status: Home / Self Care | Payer: Medicare HMO | Source: Ambulatory Visit | Attending: Internal Medicine | Admitting: Internal Medicine

## 2021-11-30 ENCOUNTER — Encounter: Payer: Self-pay | Admitting: Family Medicine

## 2021-11-30 VITALS — BP 118/64 | HR 56 | Temp 97.7°F | Ht 66.0 in | Wt 199.0 lb

## 2021-11-30 DIAGNOSIS — N3281 Overactive bladder: Secondary | ICD-10-CM

## 2021-11-30 DIAGNOSIS — R29898 Other symptoms and signs involving the musculoskeletal system: Secondary | ICD-10-CM

## 2021-11-30 DIAGNOSIS — E785 Hyperlipidemia, unspecified: Secondary | ICD-10-CM | POA: Diagnosis not present

## 2021-11-30 DIAGNOSIS — R0600 Dyspnea, unspecified: Secondary | ICD-10-CM | POA: Diagnosis not present

## 2021-11-30 DIAGNOSIS — I5031 Acute diastolic (congestive) heart failure: Secondary | ICD-10-CM

## 2021-11-30 DIAGNOSIS — J849 Interstitial pulmonary disease, unspecified: Secondary | ICD-10-CM | POA: Diagnosis not present

## 2021-11-30 DIAGNOSIS — G44311 Acute post-traumatic headache, intractable: Secondary | ICD-10-CM

## 2021-11-30 DIAGNOSIS — M159 Polyosteoarthritis, unspecified: Secondary | ICD-10-CM

## 2021-11-30 DIAGNOSIS — R7989 Other specified abnormal findings of blood chemistry: Secondary | ICD-10-CM | POA: Diagnosis not present

## 2021-11-30 DIAGNOSIS — E039 Hypothyroidism, unspecified: Secondary | ICD-10-CM | POA: Diagnosis not present

## 2021-11-30 DIAGNOSIS — R609 Edema, unspecified: Secondary | ICD-10-CM

## 2021-11-30 DIAGNOSIS — I6523 Occlusion and stenosis of bilateral carotid arteries: Secondary | ICD-10-CM

## 2021-11-30 DIAGNOSIS — J4489 Other specified chronic obstructive pulmonary disease: Secondary | ICD-10-CM

## 2021-11-30 DIAGNOSIS — R519 Headache, unspecified: Secondary | ICD-10-CM | POA: Diagnosis not present

## 2021-11-30 DIAGNOSIS — N1831 Chronic kidney disease, stage 3a: Secondary | ICD-10-CM | POA: Diagnosis not present

## 2021-11-30 DIAGNOSIS — I10 Essential (primary) hypertension: Secondary | ICD-10-CM

## 2021-11-30 MED ORDER — LEVOTHYROXINE SODIUM 112 MCG PO TABS: 112.0000 ug | ORAL_TABLET | Freq: Every day | ORAL | 3 refills | Status: DC

## 2021-11-30 MED ORDER — FUROSEMIDE 20 MG PO TABS: 20.0000 mg | ORAL_TABLET | Freq: Every day | ORAL | 3 refills | Status: DC

## 2021-11-30 NOTE — Telephone Encounter (Signed)
Tammy will you advise on message as Dr. [name] is unavailable

## 2021-11-30 NOTE — Telephone Encounter (Signed)
Left another message this morning for Taron advising to call the office back regarding pt MRI cancellation

## 2021-11-30 NOTE — Telephone Encounter (Signed)
Pt called. Her stomach is upset from the esbriet. Has lost a lot of weight since starting it (does nt remember start date). Does not believe that ti is helping her breathing. Pharmacy is [facility] [address]. Please advise.

## 2021-11-30 NOTE — Telephone Encounter (Signed)
Called and spoke with pt letting her know info per TP and she verbalized understanding. Appt scheduled for pt with Dr. [name] 12/5. Nothing further needed.

## 2021-11-30 NOTE — Telephone Encounter (Signed)
Spoke with pt who states Esbriet has caused her to have no taste, wt loss aprox. (22 lbs since September when she started medication) and she does not feel that it is helping her breathing in any way. She states Esbriet has also changed her voice. Pt states she can not keep taking it. Dr. [name] please advise.

## 2021-11-30 NOTE — Progress Notes (Signed)
Subjective:    Patient ID: Rachael [name], female    DOB: [birth year], 88 y.o.   MRN: [id number]  HPI Here to follow up on issues. We have been following her for injuries resulting from a fall on 11-06-21 when she landed on her tailbone and struck the back of her head on cement. There was no LOC. We have seen her several times since. Xrays of the LS were unremarkable, but she continues to have a lot of lower back pain. Using Tramadol. We have ordered a lumbar MRI, but the insurance company is dragging their feet about approving it. She still has a mild posterior headache also, though no nausea or other neurologic problems. She and her family are worried about possibly internal head trauma. Otherwise she has been seeing Dr. [name] for the ILD. She is taking Esbriet fir this, but this causes her so much nasuea that she can barely eat anything. She has seen a Dr. Idamae Schuller at [facility], and he is working on getting her an appt to see a [facility] ILD specialist. Her leg edema is stable. She wears compression stockings every day. Her OA is stable.    Review of Systems  Constitutional: Negative.   HENT: Negative.    Eyes: Negative.   Respiratory: Negative.    Cardiovascular: Negative.   Gastrointestinal: Negative.   Genitourinary:  Negative for decreased urine volume, difficulty urinating, dyspareunia, dysuria, enuresis, flank pain, frequency, hematuria, pelvic pain and urgency.  Musculoskeletal:  Positive for arthralgias and back pain.  Skin: Negative.   Neurological:  Positive for headaches.  Psychiatric/Behavioral: Negative.         Objective:   Physical Exam Constitutional:      General: She is not in acute distress.    Appearance: She is well-developed.     Comments: In a wheelchair   HENT:     Head: Normocephalic and atraumatic.     Right Ear: External ear normal.     Left Ear: External ear normal.     Nose: Nose normal.     Mouth/Throat:     Pharynx: No oropharyngeal exudate.   Eyes:     General: No scleral icterus.    Conjunctiva/sclera: Conjunctivae normal.     Pupils: Pupils are equal, round, and reactive to light.  Neck:     Thyroid: No thyromegaly.     Vascular: No JVD.  Cardiovascular:     Rate and Rhythm: Normal rate and regular rhythm.     Heart sounds: Normal heart sounds. No murmur heard.    No friction rub. No gallop.  Pulmonary:     Effort: Pulmonary effort is normal. No respiratory distress.     Breath sounds: Normal breath sounds. No wheezing or rales.  Chest:     Chest wall: No tenderness.  Abdominal:     General: Bowel sounds are normal. There is no distension.     Palpations: Abdomen is soft. There is no mass.     Tenderness: There is no abdominal tenderness. There is no guarding or rebound.  Musculoskeletal:        General: No tenderness. Normal range of motion.     Cervical back: Normal range of motion and neck supple.  Lymphadenopathy:     Cervical: No cervical adenopathy.  Skin:    General: Skin is warm and dry.     Findings: No erythema or rash.  Neurological:     General: No focal deficit present.     Mental Status:  She is alert and oriented to person, place, and time.     Cranial Nerves: No cranial nerve deficit.     Motor: No abnormal muscle tone.     Coordination: Coordination normal.     Deep Tendon Reflexes: Reflexes are normal and symmetric. Reflexes normal.  Psychiatric:        Behavior: Behavior normal.        Thought Content: Thought content normal.        Judgment: Judgment normal.           Assessment & Plan:  She is dealing with a headache and sacral pain after a fall. Hopefully we can get an MRI of the LS spine soon to rule out compression fractures, etc. She has a lingering headache, so we will get a head CT to rule out subdural bleeds, etc. Her HTN and OA and OAB are stable. Her hypothyroidism is well controlled. She will see the [facility] pulmonologist soon for the ILD. We spent a total of ( 35  ) minutes  reviewing records and discussing these issues.  Alysia Penna, MD

## 2021-11-30 NOTE — Telephone Encounter (Signed)
So sorry to hear this. Would stop Esbriet for now. Please get her an appointment to come into the office either with Dr. [name] or [facility] in the next couple weeks to be evaluated Please contact office for sooner follow up if symptoms do not improve or worsen or seek emergency care

## 2021-12-01 ENCOUNTER — Ambulatory Visit
Admission: RE | Admit: 2021-12-01 | Discharge: 2021-12-01 | Disposition: A | Discharge status: Home / Self Care | Payer: Medicare HMO | Source: Ambulatory Visit | Attending: Family Medicine | Admitting: Family Medicine

## 2021-12-01 DIAGNOSIS — M545 Low back pain, unspecified: Secondary | ICD-10-CM | POA: Diagnosis not present

## 2021-12-01 DIAGNOSIS — M48061 Spinal stenosis, lumbar region without neurogenic claudication: Secondary | ICD-10-CM | POA: Diagnosis not present

## 2021-12-01 NOTE — Telephone Encounter (Signed)
Spoke with Taron with [facility] regarding pt MRI, stated that they cancelled pt appointment due to [facility] not approving procedure, Message sent to [facility] referral coordinator to check with pt  insurance.

## 2021-12-07 ENCOUNTER — Telehealth: Payer: Self-pay | Admitting: Family Medicine

## 2021-12-07 DIAGNOSIS — S3210XA Unspecified fracture of sacrum, initial encounter for closed fracture: Secondary | ICD-10-CM

## 2021-12-07 NOTE — Telephone Encounter (Signed)
Valarie Merino called to give results for pt MRI Lumbar Spine; nurse can speak to anyone to receive these results.   [phone]  Please advise

## 2021-12-07 NOTE — Telephone Encounter (Signed)
[place] called to report about pt MRI results, states that the report is on Epic for Dr Sarajane Jews to view.

## 2021-12-08 ENCOUNTER — Encounter: Payer: Self-pay | Admitting: Family Medicine

## 2021-12-08 NOTE — Telephone Encounter (Signed)
I did a stat referral to Dr. Maryellen Pile at [facility]

## 2021-12-09 NOTE — Telephone Encounter (Signed)
Pt is aware.  

## 2021-12-15 ENCOUNTER — Other Ambulatory Visit: Payer: Self-pay | Admitting: Pharmacist

## 2021-12-15 ENCOUNTER — Ambulatory Visit: Payer: Medicare HMO | Admitting: Pulmonary Disease

## 2021-12-15 DIAGNOSIS — J849 Interstitial pulmonary disease, unspecified: Secondary | ICD-10-CM

## 2021-12-15 NOTE — Telephone Encounter (Signed)
Received refill request from [facility] for Esbriet however patinet has been off of treatment for x 1 week. She states she feels no improvement in symptoms (nausea) and has not gained taste sense back. She states she received one shipment of Esbriet and will drop off to the clinic when she is feeling better (also fell and broke tailbone in October 2023)  Pirfenidone removed from med list today  Knox Saliva, PharmD, MPH, BCPS, CPP Clinical Pharmacist (Rheumatology and Pulmonology)

## 2022-01-10 ENCOUNTER — Emergency Department (HOSPITAL_BASED_OUTPATIENT_CLINIC_OR_DEPARTMENT_OTHER): Payer: Medicare HMO

## 2022-01-10 ENCOUNTER — Emergency Department (HOSPITAL_BASED_OUTPATIENT_CLINIC_OR_DEPARTMENT_OTHER)
Admission: EM | Admit: 2022-01-10 | Discharge: 2022-01-10 | Disposition: A | Discharge status: Home / Self Care | Payer: Medicare HMO | Attending: Emergency Medicine | Admitting: Emergency Medicine

## 2022-01-10 ENCOUNTER — Encounter (HOSPITAL_BASED_OUTPATIENT_CLINIC_OR_DEPARTMENT_OTHER): Payer: Self-pay

## 2022-01-10 ENCOUNTER — Other Ambulatory Visit: Payer: Self-pay

## 2022-01-10 DIAGNOSIS — M47816 Spondylosis without myelopathy or radiculopathy, lumbar region: Secondary | ICD-10-CM | POA: Diagnosis not present

## 2022-01-10 DIAGNOSIS — J45909 Unspecified asthma, uncomplicated: Secondary | ICD-10-CM | POA: Diagnosis not present

## 2022-01-10 DIAGNOSIS — I10 Essential (primary) hypertension: Secondary | ICD-10-CM | POA: Insufficient documentation

## 2022-01-10 DIAGNOSIS — Z79899 Other long term (current) drug therapy: Secondary | ICD-10-CM | POA: Diagnosis not present

## 2022-01-10 DIAGNOSIS — N281 Cyst of kidney, acquired: Secondary | ICD-10-CM | POA: Diagnosis not present

## 2022-01-10 DIAGNOSIS — M4856XA Collapsed vertebra, not elsewhere classified, lumbar region, initial encounter for fracture: Secondary | ICD-10-CM | POA: Diagnosis not present

## 2022-01-10 DIAGNOSIS — R1031 Right lower quadrant pain: Secondary | ICD-10-CM | POA: Diagnosis not present

## 2022-01-10 DIAGNOSIS — R001 Bradycardia, unspecified: Secondary | ICD-10-CM | POA: Diagnosis not present

## 2022-01-10 DIAGNOSIS — R109 Unspecified abdominal pain: Secondary | ICD-10-CM

## 2022-01-10 DIAGNOSIS — Z7982 Long term (current) use of aspirin: Secondary | ICD-10-CM | POA: Diagnosis not present

## 2022-01-10 DIAGNOSIS — S3210XA Unspecified fracture of sacrum, initial encounter for closed fracture: Secondary | ICD-10-CM | POA: Diagnosis not present

## 2022-01-10 DIAGNOSIS — E039 Hypothyroidism, unspecified: Secondary | ICD-10-CM | POA: Diagnosis not present

## 2022-01-10 LAB — URINALYSIS, ROUTINE W REFLEX MICROSCOPIC
Glucose, UA: NEGATIVE mg/dL
Hgb urine dipstick: NEGATIVE
Ketones, ur: NEGATIVE mg/dL
Leukocytes,Ua: NEGATIVE
Nitrite: NEGATIVE
Protein, ur: NEGATIVE mg/dL
Specific Gravity, Urine: >=1.03 (ref 1.005–1.030)
pH: 5 (ref 5.0–8.0)

## 2022-01-10 LAB — COMPREHENSIVE METABOLIC PANEL
ALT: 17 U/L (ref 0–44)
AST: 25 U/L (ref 15–41)
Albumin: 3.5 g/dL (ref 3.5–5.0)
Alkaline Phosphatase: 80 U/L (ref 38–126)
Anion gap: 9 (ref 5–15)
BUN: 16 mg/dL (ref 8–23)
CO2: 27 mmol/L (ref 22–32)
Calcium: 9.4 mg/dL (ref 8.9–10.3)
Chloride: 105 mmol/L (ref 98–111)
Creatinine, Ser: 0.93 mg/dL (ref 0.44–1.00)
GFR, Estimated: 60 mL/min — ABNORMAL LOW (ref >60)
Glucose, Bld: 110 mg/dL — ABNORMAL HIGH (ref 70–99)
Potassium: 3.9 mmol/L (ref 3.5–5.1)
Sodium: 141 mmol/L (ref 135–145)
Total Bilirubin: 1.3 mg/dL — ABNORMAL HIGH (ref 0.3–1.2)
Total Protein: 6.8 g/dL (ref 6.5–8.1)

## 2022-01-10 LAB — CBC
HCT: 38.7 % (ref 36.0–46.0)
Hemoglobin: 12.4 g/dL (ref 12.0–15.0)
MCH: 28.8 pg (ref 26.0–34.0)
MCHC: 32 g/dL (ref 30.0–36.0)
MCV: 89.8 fL (ref 80.0–100.0)
Platelets: 298 10*3/uL (ref 150–400)
RBC: 4.31 MIL/uL (ref 3.87–5.11)
RDW: 14.5 % (ref 11.5–15.5)
WBC: 11.6 10*3/uL — ABNORMAL HIGH (ref 4.0–10.5)
nRBC: 0 % (ref 0.0–0.2)

## 2022-01-10 LAB — LIPASE, BLOOD: Lipase: 40 U/L (ref 11–51)

## 2022-01-10 MED ORDER — LIDOCAINE 5 % EX PTCH
1.0000 | MEDICATED_PATCH | Freq: Once | CUTANEOUS | Status: DC
  Administered 2022-01-10: 1 via TRANSDERMAL
  Filled 2022-01-10: qty 1

## 2022-01-10 MED ORDER — ACETAMINOPHEN 325 MG PO TABS
650.0000 mg | ORAL_TABLET | Freq: Once | ORAL | Status: AC
  Administered 2022-01-10: 650 mg via ORAL
  Filled 2022-01-10: qty 2

## 2022-01-10 MED ORDER — LIDOCAINE 5 % EX PTCH: 1.0000 | MEDICATED_PATCH | CUTANEOUS | 0 refills | Status: DC

## 2022-01-10 MED ORDER — KETOROLAC TROMETHAMINE 30 MG/ML IJ SOLN
15.0000 mg | Freq: Once | INTRAMUSCULAR | Status: AC
  Administered 2022-01-10: 15 mg via INTRAVENOUS
  Filled 2022-01-10: qty 1

## 2022-01-10 NOTE — ED Provider Notes (Signed)
[facility] EMERGENCY DEPARTMENT Provider Note   CSN: [id number] Arrival date & time: 01/10/22  0940     History  Chief Complaint  Patient presents with   Abdominal Pain    Rachael [name] is a 88 y.o. female.  Patient is an 88 year old female with PMH asthma, HTN, HLD, and hypothyroidism presenting to the emergency department with R-sided flank pain. The patient states she woke up yesterday morning initially feeling like she slept wrong reports that throughout the day her pain was getting worse.  She states that she was hardly able to get out of bed this morning that prompted her to come to the emergency department.  She reports that her pain is worse with any type of movement.  She states that she had nausea but denies any vomiting.  She denies any fevers or chills.  She states that she was constipated yesterday.  She denies any dysuria or hematuria.  She states she has had a prior cholecystectomy.  The history is provided by the patient and a relative.  Abdominal Pain      Home Medications Prior to Admission medications   Medication Sig Start Date End Date Taking? Authorizing Provider  lidocaine (LIDODERM) 5 % Place 1 patch onto the skin daily. Remove & Discard patch within 12 hours or as directed by MD 01/10/22  Yes Maylon Peppers, Jordan Hawks K, DO  albuterol (VENTOLIN HFA) 108 (90 Base) MCG/ACT inhaler Inhale 2 puffs into the lungs every 4 (four) hours as needed for wheezing or shortness of breath. 05/22/21   [name], MD  aspirin 81 MG EC tablet Take one every other day Patient taking differently: Take 81 mg by mouth every other day. 02/24/21   [name], MD  atorvastatin (LIPITOR) 80 MG tablet Take 0.5 tablets (40 mg total) by mouth daily. 10/29/21   [name], MD  budesonide-formoterol (SYMBICORT) 80-4.5 MCG/ACT inhaler Inhale 2 puffs into the lungs 2 (two) times daily. 06/01/21   Jeanie Sewer, NP  Cholecalciferol (VITAMIN D3 PO) Take 1 capsule by mouth  daily.    [provider]  colchicine 0.6 MG tablet Take 1 tablet (0.6 mg total) by mouth every 6 (six) hours as needed (gout). 08/18/21   [name], MD  Cyanocobalamin (B-12 PO) Take 1 tablet by mouth daily.     [provider]  furosemide (LASIX) 20 MG tablet Take 1 tablet (20 mg total) by mouth daily. Take one tablet once a day 11/30/21   [name], MD  levothyroxine (SYNTHROID) 112 MCG tablet Take 1 tablet (112 mcg total) by mouth daily before breakfast. 11/30/21   [name], MD  LORazepam (ATIVAN) 0.5 MG tablet Take 1 tablet (0.5 mg total) by mouth daily as needed for anxiety (to relax breathing). 06/01/21   Jeanie Sewer, NP  OVER THE COUNTER MEDICATION Take 1 Dose by mouth daily. Juice Plus [employer]    [provider]  traMADol (ULTRAM) 50 MG tablet Take 2 tablets (100 mg total) by mouth every 6 (six) hours as needed. 11/13/21   [name], MD      Allergies    Shellfish allergy, Percocet [oxycodone-acetaminophen], Iodinated contrast media, and Iodine    Review of Systems   Review of Systems  Gastrointestinal:  Positive for abdominal pain.    Physical Exam Updated Vital Signs BP (!) 132/49   Pulse (!) 57   Temp 98.4 F (36.9 C) Comment: 98.4  Resp 12  Wt 94.8 kg   SpO2 96%   BMI 33.73 kg/m  Physical Exam Vitals and nursing note reviewed.  Constitutional:      General: She is not in acute distress.    Appearance: She is well-developed.  HENT:     Head: Normocephalic and atraumatic.     Mouth/Throat:     Mouth: Mucous membranes are moist.     Pharynx: Oropharynx is clear.  Eyes:     Pupils: Pupils are equal, round, and reactive to light.  Cardiovascular:     Rate and Rhythm: Normal rate and regular rhythm.     Heart sounds: Normal heart sounds.  Pulmonary:     Effort: Pulmonary effort is normal.     Breath sounds: Normal breath sounds.  Abdominal:     General: Abdomen is flat.     Palpations: Abdomen is soft.      Tenderness: There is abdominal tenderness (RLQ to R flank/R low back) in the right lower quadrant. There is no right CVA tenderness, left CVA tenderness, guarding or rebound.  Skin:    General: Skin is warm and dry.     Findings: No rash.     Comments: Small contusion to RLQ of abdomen  Neurological:     General: No focal deficit present.     Mental Status: She is alert and oriented to person, place, and time.  Psychiatric:        Mood and Affect: Mood normal.        Behavior: Behavior normal.     ED Results / Procedures / Treatments   Labs (all labs ordered are listed, but only abnormal results are displayed) Labs Reviewed  COMPREHENSIVE METABOLIC PANEL - Abnormal; Notable for the following components:      Result Value   Glucose, Bld 110 (*)    Total Bilirubin 1.3 (*)    GFR, Estimated 60 (*)    All other components within normal limits  CBC - Abnormal; Notable for the following components:   WBC 11.6 (*)    All other components within normal limits  URINALYSIS, ROUTINE W REFLEX MICROSCOPIC - Abnormal; Notable for the following components:   Bilirubin Urine SMALL (*)    All other components within normal limits  LIPASE, BLOOD    EKG EKG Interpretation  Date/Time:  Sunday January 10 2022 09:55:51 EST Ventricular Rate:  52 PR Interval:    QRS Duration: 100 QT Interval:  439 QTC Calculation: 409 R Axis:   88 Text Interpretation: Sinus bradycardia Borderline right axis deviation Borderline low voltage, extremity leads Probable anteroseptal infarct, old Confirmed by Leanord Asal ([id number]) on 01/10/2022 9:57:42 AM  Radiology CT Renal Stone Study  Result Date: 01/10/2022 CLINICAL DATA:  88 year old female with acute abdominal and flank pain. EXAM: CT ABDOMEN AND PELVIS WITHOUT CONTRAST TECHNIQUE: Multidetector CT imaging of the abdomen and pelvis was performed following the standard protocol without IV contrast. RADIATION DOSE REDUCTION: This exam was performed  according to the departmental dose-optimization program which includes automated exposure control, adjustment of the mA and/or kV according to patient size and/or use of iterative reconstruction technique. COMPARISON:  12/01/2021 lumbar spine MR, 06/11/2021 CT and prior studies FINDINGS: Please note that parenchymal and vascular abnormalities may be missed as intravenous contrast was not administered. Bilateral hip arthroplasties obscures detail within the pelvis. Lower chest: No acute abnormality. Bibasilar subpleural reticular opacities again noted. Hepatobiliary: The liver is unremarkable. The patient is status post cholecystectomy. There is no evidence of intrahepatic or  extrahepatic biliary dilatation. Pancreas: Unremarkable Spleen: Unremarkable Adrenals/Urinary Tract: Renal cysts are again noted and no follow-up imaging is recommended. There is no evidence of hydronephrosis or urinary calculi. The adrenal glands are unremarkable. The visualized bladder is not well distended. Stomach/Bowel: Stomach is within normal limits. Appendix not identified. No evidence of bowel wall thickening, distention, or inflammatory changes. Vascular/Lymphatic: Aortic atherosclerosis. No enlarged abdominal or pelvic lymph nodes. Reproductive: No definite abnormality Other: No ascites, focal collection or pneumoperitoneum. Anterior abdominal/pelvic wall mesh again noted. Musculoskeletal: No acute or suspicious bony abnormalities are noted. Subacute fracture at S1-S2 and sacral ala are again identified. L3 and L4 compression fractures are unchanged with L3 vertebral augmentation changes again noted. Multilevel degenerative changes within the lumbar spine are again identified. IMPRESSION: 1. No evidence of acute abnormality. No evidence of hydronephrosis or urinary calculi. 2. Unchanged S1-S2, L3 and L4 fractures with L3 vertebral augmentation changes. 3.  Aortic Atherosclerosis (ICD10-I70.0). Electronically Signed   By: Margarette Canada  M.D.   On: 01/10/2022 11:00    Procedures Procedures    Medications Ordered in ED Medications  lidocaine (LIDODERM) 5 % 1 patch (has no administration in time range)  ketorolac (TORADOL) 30 MG/ML injection 15 mg (15 mg Intravenous Given 01/10/22 1019)  acetaminophen (TYLENOL) tablet 650 mg (650 mg Oral Given 01/10/22 1018)    ED Course/ Medical Decision Making/ A&P Clinical Course as of 01/10/22 1205  Sun Jan 10, 2022  1129 Labs with mild leukocytosis, otherwise within normal range. CT without evidence of pyelonephritis or nephrolithiasis.  Appendix was not visualized but no thickening inflammation making appendicitis unlikely.  UA is pending. [VK]    Clinical Course User Index [VK] Kemper Durie, DO                           Medical Decision Making This patient presents to the ED with chief complaint(s) of R flank pain with pertinent past medical history of HTN, HLD, hypothyroidism which further complicates the presenting complaint. The complaint involves an extensive differential diagnosis and also carries with it a high risk of complications and morbidity.    The differential diagnosis includes muscle strain/spasm, no rash making shingles unlikely, pyelonephritis, nephrolithiasis, appendicitis, UTI  Additional history obtained: Additional history obtained from family Records reviewed N/A  ED Course and Reassessment: Patient was awake alert and comfortable appearing on arrival in no acute distress.  She had significant pain with changing positions concerning for possible muscle strain and she will be treated with Tylenol and Toradol.  Due to the location of her pain she will have labs, urine and CT abdomen and pelvis to evaluate for nephrolithiasis, pyelonephritis or appendicitis and she will be closely reassessed.  Independent labs interpretation:  The following labs were independently interpreted: Within normal range  Independent visualization of imaging: - I  independently visualized the following imaging with scope of interpretation limited to determining acute life threatening conditions related to emergency care: CT AP stone search, which revealed no acute disease  Consultation: - Consulted or discussed management/test interpretation w/ external professional: N/A  Consideration for admission or further workup: Patient has no emergent conditions requiring admission or further work-up at this time and is stable for discharge home with primary care follow-up  Social Determinants of health: N/A    Amount and/or Complexity of Data Reviewed Labs: ordered. Radiology: ordered.  Risk OTC drugs. Prescription drug management.          Final  Clinical Impression(s) / ED Diagnoses Final diagnoses:  Right flank pain    Rx / DC Orders ED Discharge Orders          Ordered    lidocaine (LIDODERM) 5 %  Every 24 hours        01/10/22 1203              Kemper Durie, DO 01/10/22 1205

## 2022-01-10 NOTE — ED Notes (Signed)
Reviewed discharge instructions and follow up with pt and family member. States understanding. Pt assisted via W/C to discharge

## 2022-01-10 NOTE — Discharge Instructions (Addendum)
You were seen in the emergency department for your right-sided flank pain.  Your workup showed no signs of appendicitis, kidney stones or urinary tract infection.  You may have strained a muscle in your back which is causing the pain that is worse with movement.  You can continue to take Tylenol and Motrin as needed for pain and both can be taken up to every 6 hours.  You can also take your home tramadol as needed.  You can use lidocaine patches, ice or heat and should try to stretch and do your normal activities as best he can to prevent your muscles from getting more stiff.  You should follow-up with your primary doctor in the next few days to have your symptoms rechecked.  You should return to the emergency department if you are having significantly worsening pain, fevers, repetitive vomiting or if you have any other new or concerning symptoms.

## 2022-01-10 NOTE — ED Notes (Signed)
Unable to provide urine specimen at this time.  

## 2022-01-10 NOTE — ED Triage Notes (Signed)
Pt arrived POV with daughter. Presents with right sided pain that radiates to right back. Pain started after getting up from chair yesterday. Last BM yesterday. Stool was hard

## 2022-01-12 ENCOUNTER — Other Ambulatory Visit: Payer: Self-pay | Admitting: Internal Medicine

## 2022-01-13 ENCOUNTER — Encounter: Payer: Self-pay | Admitting: Family Medicine

## 2022-01-13 ENCOUNTER — Ambulatory Visit (INDEPENDENT_AMBULATORY_CARE_PROVIDER_SITE_OTHER): Payer: Medicare HMO | Admitting: Family Medicine

## 2022-01-13 ENCOUNTER — Telehealth: Payer: Self-pay | Admitting: Family Medicine

## 2022-01-13 VITALS — BP 110/60 | HR 52 | Temp 97.8°F | Wt 194.0 lb

## 2022-01-13 DIAGNOSIS — R109 Unspecified abdominal pain: Secondary | ICD-10-CM | POA: Diagnosis not present

## 2022-01-13 DIAGNOSIS — M48061 Spinal stenosis, lumbar region without neurogenic claudication: Secondary | ICD-10-CM | POA: Diagnosis not present

## 2022-01-13 DIAGNOSIS — J4489 Other specified chronic obstructive pulmonary disease: Secondary | ICD-10-CM | POA: Diagnosis not present

## 2022-01-13 MED ORDER — HYDROCODONE-ACETAMINOPHEN 10-325 MG PO TABS: 1.0000 | ORAL_TABLET | Freq: Four times a day (QID) | ORAL | 0 refills | Status: DC | PRN

## 2022-01-13 MED ORDER — LORAZEPAM 0.5 MG PO TABS: 0.5000 mg | ORAL_TABLET | Freq: Three times a day (TID) | ORAL | 1 refills | Status: DC | PRN

## 2022-01-13 NOTE — Telephone Encounter (Signed)
Pt is calling and [facility] does not have in stock HYDROcodone-acetaminophen (NORCO) 10-325 MG tablet however they do have 7.5-325 mg in [place] # [place], [place] Phone: [phone]  Fax: [phone]

## 2022-01-13 NOTE — Progress Notes (Signed)
   Subjective:    Patient ID: Rachael [name], female    DOB: 1933/10/22, 89 y.o.   MRN: [id number]  HPI Here with her son for 5 days of a sharp pain in the right flank that shoots around to the right lower back. No recent trauma. No urinary symptoms. Her BM's are normal. No fever or nausea. She was seen at the ED on 01-10-22 and her exam was unremarkable. Lab work was normal, and a CT renal study showed a normal abdomen and pelvis with no calculi. She has been taking Tramadol with no relief from the pain. She has been sleeping in her recliner because the pain is worst when she lies down.   Review of Systems  Constitutional: Negative.   Respiratory: Negative.    Cardiovascular: Negative.   Gastrointestinal: Negative.   Genitourinary:  Positive for flank pain. Negative for dysuria, frequency and urgency.  Musculoskeletal:  Positive for back pain.       Objective:   Physical Exam Constitutional:      General: She is not in acute distress.    Comments: In a wheelchair   Cardiovascular:     Rate and Rhythm: Normal rate and regular rhythm.     Pulses: Normal pulses.     Heart sounds: Normal heart sounds.  Pulmonary:     Effort: Pulmonary effort is normal.     Breath sounds: Normal breath sounds.  Abdominal:     General: Abdomen is flat. Bowel sounds are normal. There is no distension.     Palpations: Abdomen is soft. There is no mass.     Tenderness: There is no right CVA tenderness, left CVA tenderness, guarding or rebound.     Hernia: No hernia is present.     Comments: She is mildly tender in the right flank  Musculoskeletal:     Comments: She is very tender in the right lower back in a direct line around from the flank tenderness we found   Neurological:     Mental Status: She is alert.           Assessment & Plan:  This pain is coming from her lumbar spine and it is radiating around the flank. She is already scheduled to see Dr. Maryellen Pile, her spinal surgeon at  [facility], on 02-11-22. She will try to get in to see him sooner if possible. We will supply her with Norco to use as needed for pain control. We spent a total of ( 35  ) minutes reviewing records and discussing these issues.  Alysia Penna, MD

## 2022-01-13 NOTE — Telephone Encounter (Signed)
Please advise 

## 2022-01-14 ENCOUNTER — Telehealth: Payer: Self-pay | Admitting: Family Medicine

## 2022-01-14 MED ORDER — HYDROCODONE-ACETAMINOPHEN 7.5-325 MG PO TABS: 1.0000 | ORAL_TABLET | Freq: Four times a day (QID) | ORAL | 0 refills | Status: AC | PRN

## 2022-01-14 NOTE — Telephone Encounter (Signed)
I cancelled the 10 mg and ordered the 7.5 mg dose

## 2022-01-14 NOTE — Telephone Encounter (Signed)
  Pt called requesting to speak to CMA.  Pt was asked if it was regarding a medication refill, as I could help with that.  Pt stated that she just wants a call back to ask a question she forgot to ask yesterday.

## 2022-01-14 NOTE — Telephone Encounter (Signed)
Called patient she requested a phone call from [place] questions from 01/13/22 appointment.

## 2022-01-14 NOTE — Telephone Encounter (Signed)
Called patient informed Hydrocodone 7.5 mg sent to [facility].

## 2022-01-15 NOTE — Telephone Encounter (Signed)
Spoke with pt awaiting pt to call me back with some information

## 2022-01-15 NOTE — Telephone Encounter (Signed)
Spoke with pt, nothing further needed

## 2022-01-22 ENCOUNTER — Encounter: Payer: Self-pay | Admitting: Family Medicine

## 2022-01-22 ENCOUNTER — Ambulatory Visit (INDEPENDENT_AMBULATORY_CARE_PROVIDER_SITE_OTHER): Payer: Medicare HMO | Admitting: Family Medicine

## 2022-01-22 VITALS — BP 108/52 | HR 47 | Temp 97.8°F | Ht 66.0 in

## 2022-01-22 DIAGNOSIS — R112 Nausea with vomiting, unspecified: Secondary | ICD-10-CM | POA: Diagnosis not present

## 2022-01-22 DIAGNOSIS — R197 Diarrhea, unspecified: Secondary | ICD-10-CM

## 2022-01-22 MED ORDER — ONDANSETRON 4 MG PO TBDP: 4.0000 mg | ORAL_TABLET | Freq: Three times a day (TID) | ORAL | 0 refills | Status: DC | PRN

## 2022-01-22 MED ORDER — LOPERAMIDE HCL 2 MG PO TABS: 2.0000 mg | ORAL_TABLET | Freq: Four times a day (QID) | ORAL | 0 refills | Status: DC | PRN

## 2022-01-22 NOTE — Progress Notes (Signed)
Established Patient Office Visit  Subjective   Patient ID: Rachael [name], female    DOB: 12/14/1933  Age: 89 y.o. MRN: [id number]  Chief Complaint  Patient presents with   Diarrhea    Patient states she has had diarrhea since 3am, tried Pepto Bismol with no relief and states she started Colchicine 2 days ago due to gout flare-up    Pt reports she woke up this morning around 3 am and had severe diarrhea. Pt reports no sick contacts, states she has not eaten anything unusual that she can remember. Pt reports she did start colchicine 2 days ago for an acute gout flare up. States that this is the first time it has caused this diarrhea. She reports some subjective chills but no fever. States she is feeling nauseated and had 1 episode of vomiting. States that when she tries to eat or drink her stomach gets very upset and starts gurgling. States the stool is pure water at  this point.   I did review her labs from 01/10/22 and also a CT of her abdomen on 01/10/22 which showed no abnormalities in the stomach or bowel.   Diarrhea  This is a new problem. The current episode started today. The problem occurs more than 10 times per day. The problem has been unchanged. The stool consistency is described as Watery. The patient states that diarrhea awakens her from sleep. Associated symptoms include abdominal pain and vomiting. Pertinent negatives include no fever.   Current Outpatient Medications  Medication Instructions   albuterol (VENTOLIN HFA) 108 (90 Base) MCG/ACT inhaler 2 puffs, Inhalation, Every 4 hours PRN   aspirin 81 MG EC tablet Take one every other day   atorvastatin (LIPITOR) 40 mg, Oral, Daily   budesonide-formoterol (SYMBICORT) 80-4.5 MCG/ACT inhaler 2 puffs, Inhalation, 2 times daily   Cholecalciferol (VITAMIN D3 PO) 1 capsule, Oral, Daily   colchicine 0.6 mg, Oral, Every 6 hours PRN   Cyanocobalamin (B-12 PO) 1 tablet, Oral, Daily   furosemide (LASIX) 20 mg, Oral, Daily, Take one  tablet once a day   HYDROcodone-acetaminophen (NORCO) 7.5-325 MG tablet 1 tablet, Oral, Every 6 hours PRN   levothyroxine (SYNTHROID) 112 mcg, Oral, Daily before breakfast   lidocaine (LIDODERM) 5 % 1 patch, Transdermal, Every 24 hours, Remove & Discard patch within 12 hours or as directed by MD   loperamide (IMODIUM A-D) 2 mg, Oral, 4 times daily PRN   LORazepam (ATIVAN) 0.5 mg, Oral, Every 8 hours PRN   ondansetron (ZOFRAN-ODT) 4 mg, Oral, Every 8 hours PRN   OVER THE COUNTER MEDICATION 1 Dose, Oral, Daily, Juice Plus Berry Blend   traMADol (ULTRAM) 100 mg, Oral, Every 6 hours PRN     Patient Active Problem List   Diagnosis Date Noted   Nodule of upper lobe of right lung 08/19/2021   Dilation of thoracic aorta (HCC) 08/19/2021   Atherosclerosis of coronary artery of native heart without angina pectoris 08/19/2021   Urge incontinence of urine 07/13/2021   Acute respiratory failure with hypoxia (HCC) 06/11/2021   Essential hypertension 06/11/2021   Elevated troponin level not due myocardial infarction 06/11/2021   Leukocytosis 06/11/2021   ILD (interstitial lung disease) ([place]) 06/04/2021   Frequent episodes of bronchitis 06/04/2021   Acute on chronic diastolic CHF (congestive heart failure) ([place]) 06/04/2021   Snoring 06/04/2021   Pulmonary fibrosis ([place]) 05/22/2021   Emphysema with chronic bronchitis 05/19/2021   Ptosis of both eyelids 01/19/2021   Dyspnea 11/12/2020   Cellulitis  of right leg 11/14/2019   Acquired hypothyroidism 07/18/2019   Weight gain 07/18/2019   OAB (overactive bladder) 04/23/2019   Acute encephalopathy 02/25/2018   Bradycardia 02/25/2018   TIA (transient ischemic attack) 02/24/2018   Carotid artery stenosis 02/24/2018   Malignant neoplasm of lower-inner quadrant of right breast of female, estrogen receptor positive ([place]) 03/28/2017   PMR (polymyalgia rheumatica) ([place]) 11/11/2016   Spinal stenosis, lumbar 08/26/2016   SIRS (systemic inflammatory response  syndrome) (HCC)    Weakness of both lower extremities    Gout attack 10/23/2014   Anemia in neoplastic disease 04/02/2014   Chronic kidney disease, stage 3a ([place]) 04/02/2014   Acute renal failure ([place]) 03/18/2014   Leg edema, right    Breast cancer, right breast ([place]) 05/13/2011   Incisional hernia, incarcerated, right upper quadrant 07/13/2010   HEADACHE 11/06/2009   FATIGUE 06/13/2009   EDEMA 05/01/2008   Backache 03/29/2008   CAROTID STENOSIS 03/16/2008   MENOPAUSAL SYNDROME 09/27/2007   GLUCOSE INTOLERANCE 08/10/2007   Dyslipidemia 08/10/2007   PERIPHERAL VASCULAR DISEASE 08/10/2007   Venous (peripheral) insufficiency 08/10/2007   Vitamin D deficiency 12/26/2006   OBESITY 12/26/2006   Hypothyroidism 11/05/2006   Osteoarthritis 11/05/2006      Review of Systems  Constitutional:  Negative for fever.  Gastrointestinal:  Positive for abdominal pain, diarrhea and vomiting.  All other systems reviewed and are negative.     Objective:     BP (!) 108/52 (BP Location: Left Arm, Patient Position: Sitting, Cuff Size: Large)   Pulse (!) 47   Temp 97.8 F (36.6 C) (Oral)   Ht '5\' 6"'$  (1.676 m)   SpO2 99%   BMI 31.31 kg/m    Physical Exam Vitals reviewed.  Constitutional:      Appearance: Normal appearance. She is well-groomed and normal weight.  Cardiovascular:     Rate and Rhythm: Normal rate and regular rhythm.     Pulses: Normal pulses.     Heart sounds: S1 normal and S2 normal.  Pulmonary:     Effort: Pulmonary effort is normal.     Breath sounds: Normal breath sounds and air entry.  Abdominal:     General: Bowel sounds are increased. There is no distension.     Palpations: Abdomen is soft.     Tenderness: There is abdominal tenderness (diffuse tenderness  on both sides). There is guarding (voluntary). There is no rebound.  Neurological:     Mental Status: She is alert and oriented to person, place, and time. Mental status is at baseline.      No results  found for any visits on 01/22/22.    The ASCVD Risk score (Arnett DK, et al., 2019) failed to calculate for the following reasons:   The 2019 ASCVD risk score is only valid for ages 40 to 56    Assessment & Plan:   Problem List Items Addressed This Visit   None Visit Diagnoses     Nausea and vomiting, unspecified vomiting type    -  Primary   Relevant Medications   Will treat symptoms with ondansetron 4 mg every 8 hours as needed for nausea  ondansetron (ZOFRAN-ODT) 4 MG disintegrating tablet   Acute diarrhea       Relevant Medications   Most likely a side effect of the colchicine, however she has taken colchicine in the past and didn't have this reaction. Pt denies taking any new medications, no antibiotics, she reports no changes in her diet. She does state she  went to a church dinner once week ago but states she sat by herself. I recommended using loperamide 2 mg tablets, 1 tablet up to 4 times a day for loose stool and I recommended the BRAT diet. I encouraged increasing oral fluid intake. If her diarrhea does not improve over the weekend I advised that she contact our office for further work up.  loperamide (IMODIUM A-D) 2 MG tablet       No follow-ups on file.    Farrel Conners, MD

## 2022-01-22 NOTE — Patient Instructions (Addendum)
Hold furosemide until you are feeling better-- the diarrhea has resolved.   B R A T diet-- bananas, rice, apples and toast to help reduce the diarrhea.

## 2022-02-01 ENCOUNTER — Ambulatory Visit (INDEPENDENT_AMBULATORY_CARE_PROVIDER_SITE_OTHER): Payer: Medicare HMO | Admitting: Family Medicine

## 2022-02-01 ENCOUNTER — Encounter: Payer: Self-pay | Admitting: Family Medicine

## 2022-02-01 VITALS — BP 120/72 | HR 71 | Temp 98.0°F | Wt 193.0 lb

## 2022-02-01 DIAGNOSIS — N390 Urinary tract infection, site not specified: Secondary | ICD-10-CM | POA: Diagnosis not present

## 2022-02-01 DIAGNOSIS — R300 Dysuria: Secondary | ICD-10-CM

## 2022-02-01 LAB — POC URINALSYSI DIPSTICK (AUTOMATED)
Bilirubin, UA: NEGATIVE
Glucose, UA: NEGATIVE
Ketones, UA: NEGATIVE
Nitrite, UA: NEGATIVE
Protein, UA: POSITIVE — AB
Spec Grav, UA: 1.015 (ref 1.010–1.025)
Urobilinogen, UA: 0.2 E.U./dL
pH, UA: 6 (ref 5.0–8.0)

## 2022-02-01 MED ORDER — CIPROFLOXACIN HCL 500 MG PO TABS: 500.0000 mg | ORAL_TABLET | Freq: Two times a day (BID) | ORAL | 0 refills | Status: DC

## 2022-02-01 NOTE — Progress Notes (Signed)
   Subjective:    Patient ID: Rachael [name], female    DOB: 1933-09-26, 89 y.o.   MRN: [id number]  HPI Here for 3 days of urinary urgency and burning. No nausea or fever. Drinking lots of water.    Review of Systems  Constitutional: Negative.   Respiratory: Negative.    Cardiovascular: Negative.   Genitourinary:  Positive for dysuria, frequency and urgency. Negative for flank pain and hematuria.       Objective:   Physical Exam Constitutional:      Appearance: Normal appearance.  Cardiovascular:     Rate and Rhythm: Normal rate and regular rhythm.     Pulses: Normal pulses.     Heart sounds: Normal heart sounds.  Pulmonary:     Effort: Pulmonary effort is normal.     Breath sounds: Normal breath sounds.  Abdominal:     Tenderness: There is no right CVA tenderness or left CVA tenderness.  Neurological:     Mental Status: She is alert.           Assessment & Plan:  UTI, treat with 7 days of Cipro. She can add AZO if needed. Culture the sample.  Alysia Penna, MD

## 2022-02-02 ENCOUNTER — Ambulatory Visit: Payer: Medicare HMO | Admitting: Pulmonary Disease

## 2022-02-02 ENCOUNTER — Encounter: Payer: Self-pay | Admitting: Pulmonary Disease

## 2022-02-02 VITALS — BP 116/66 | HR 56 | Temp 98.4°F | Ht 66.0 in | Wt 193.0 lb

## 2022-02-02 DIAGNOSIS — J849 Interstitial pulmonary disease, unspecified: Secondary | ICD-10-CM

## 2022-02-02 DIAGNOSIS — G4733 Obstructive sleep apnea (adult) (pediatric): Secondary | ICD-10-CM

## 2022-02-02 DIAGNOSIS — Z5181 Encounter for therapeutic drug level monitoring: Secondary | ICD-10-CM | POA: Diagnosis not present

## 2022-02-02 NOTE — Patient Instructions (Signed)
We will start paperwork for a new medication called Ofev Start using the CPAP on a regular basis Return to clinic in 2 to 3 months.

## 2022-02-02 NOTE — Progress Notes (Signed)
Rachael [name]    [id number]    1933-02-17  Primary Care Physician:Fry, Ishmael Holter, MD  Referring Physician: [name], MD [address] [place],  [place] [address]  Chief complaint:  Follow up for interstitial lung disease  HPI: 89 y.o. with history of hyperlipidemia, hypothyroidism, diastolic CHF, CKD, anxiety disorder Referred for evaluation of abnormal CT chest from June 2023 showing interstitial lung disease She has mild dyspnea on exertion, denies any cough, sputum production, fevers or chills History notable for COVID-19 in December 2021 and was in an hospital for 1 day. Did not require supplemental O2  She had a hospitalization in June 2023 for acute respiratory failure with CTA showing no clot.  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  No feather pillows or comforters ILD questionnaire 07/07/2021-negative Smoking history: Non smoker Travel history: No significant travel Relevant family history: No family history of lung disease  Interim history: Started on Esbriet in September 2023.  She was poorly tolerated with stomach upset, weight loss She stopped taking the medication after birth with improvement in symptoms.  States that breathing is slightly worse today  CPAP started in October 2023.  She stopped seeing it after a month since she forgets to put it on  Outpatient Encounter Medications as of 02/02/2022  Medication Sig   albuterol (VENTOLIN HFA) 108 (90 Base) MCG/ACT inhaler Inhale 2 puffs into the lungs every 4 (four) hours as needed for wheezing or shortness of breath.   aspirin 81 MG EC tablet Take one every other day (Patient taking differently: Take 81 mg by mouth every other day.)   atorvastatin (LIPITOR) 80 MG  tablet Take 0.5 tablets (40 mg total) by mouth daily.   budesonide-formoterol (SYMBICORT) 80-4.5 MCG/ACT inhaler Inhale 2 puffs into the lungs 2 (two) times daily.   Cholecalciferol (VITAMIN D3 PO) Take 1 capsule by mouth daily.   ciprofloxacin (CIPRO) 500 MG tablet Take 1 tablet (500 mg total) by mouth 2 (two) times daily.   colchicine 0.6 MG tablet Take 1 tablet (0.6 mg total) by mouth every 6 (six) hours as needed (gout).   Cyanocobalamin (B-12 PO) Take 1 tablet by mouth daily.    furosemide (LASIX) 20 MG tablet Take 1 tablet (20 mg total) by mouth daily. Take one tablet once a day   HYDROcodone-acetaminophen (NORCO) 7.5-325 MG tablet Take 1 tablet by mouth every 6 (six) hours as needed for moderate pain.   levothyroxine (SYNTHROID) 112 MCG tablet Take 1 tablet (112 mcg total) by mouth daily before breakfast.   lidocaine (LIDODERM) 5 % Place 1 patch onto the skin daily. Remove & Discard patch within 12 hours or as directed by MD   loperamide (IMODIUM A-D) 2 MG tablet Take 1 tablet (2 mg total) by mouth 4 (four) times daily as needed for diarrhea or loose stools.   LORazepam (ATIVAN) 0.5 MG tablet Take 1 tablet (0.5 mg total) by mouth every 8 (eight) hours as needed for anxiety (to relax breathing).   ondansetron (ZOFRAN-ODT) 4 MG disintegrating tablet Take 1 tablet (4 mg total) by mouth every 8 (eight) hours as needed for nausea or vomiting (for nausea from wegovy or other source).   OVER THE COUNTER MEDICATION Take 1  Dose by mouth daily. Juice Plus Berry Blend   traMADol (ULTRAM) 50 MG tablet Take 2 tablets (100 mg total) by mouth every 6 (six) hours as needed.   No facility-administered encounter medications on file as of 02/02/2022.   Physical Exam: Blood pressure 116/66, pulse (!) 56, temperature 98.4 F (36.9 C), temperature source Oral, height '5\' 6"'$  (1.676 m), weight 193 lb (87.5 kg), SpO2 98 %. Gen:      No acute distress HEENT:  EOMI, sclera anicteric Neck:     No masses; no  thyromegaly Lungs:    Bibasal crackles CV:         Regular rate and rhythm; no murmurs Abd:      + bowel sounds; soft, non-tender; no palpable masses, no distension Ext:    No edema; adequate peripheral perfusion Skin:      Warm and dry; no rash Neuro: alert and oriented x 3 Psych: normal mood and affect   Data Reviewed: Imaging: CT chest abdomen pelvis 03/18/2015-dependent atelectasis/scarring at the base CTA 06/11/2021-subpleural reticulation, possible honeycombing at the base. High-resolution CT 07/22/2021-basilar predominant fibrotic disease and probable UIP pattern with mild progression since 2017 I reviewed the images personally  PFTs: 06/30/2021 FVC 2.59 [93%], FEV1 2.01 [97%], F/F 77, DLCO 17.27 [84%] Normal lung function test  Labs: CTD serologies 07/01/2021-ANA 1:80, nuclear speckled, rheumatoid factor 17  Sleep: Sleep study 08/12/2021-moderate OSA with AHI 24, desaturation to 81%  Assessment:  Assessment for interstitial lung disease The scan shows mildly progressive pulmonary fibrosis and probable UIP pattern There are no signs and symptoms of connective tissue disease or exposures.  CTD serologies show borderline elevated ANA and rheumatoid factor which are nonspecific.  PFTs are normal  This is likely to be idiopathic pulmonary fibrosis.  I discussed in detail the diagnosis and treatment options with patient and her son.  Esbriet is poorly tolerated and she does not want to go back on the medication We discussed alternate therapies and she wants to try Ofev instead LFTs in the end of December 2023 were within normal limits  Sleep apnea Sleep study last year as PSG shows moderate OSA with desaturation We discussed need for compliance with CPAP and she is going to start using it again.  Plan/Recommendations: Start paperwork for [employer] Restart CPAP  Marshell Garfinkel MD [facility] 02/02/2022, 2:23 PM  CC: [name], MD

## 2022-02-03 ENCOUNTER — Telehealth: Payer: Self-pay

## 2022-02-03 DIAGNOSIS — Z5181 Encounter for therapeutic drug level monitoring: Secondary | ICD-10-CM

## 2022-02-03 DIAGNOSIS — J849 Interstitial pulmonary disease, unspecified: Secondary | ICD-10-CM

## 2022-02-03 LAB — URINE CULTURE
MICRO NUMBER:: 14455124
SPECIMEN QUALITY:: ADEQUATE

## 2022-02-03 NOTE — Telephone Encounter (Signed)
Received provider portion of [employer] PAP application. Location of the pt portion is currently unknown, will have to look into whether or not the pt took her portion home with her.  Submitted a Prior Authorization request to [facility] for OFEV via CoverMyMeds. Will update once we receive a response.  Key: [id number]

## 2022-02-04 ENCOUNTER — Telehealth: Payer: Self-pay | Admitting: Pulmonary Disease

## 2022-02-04 NOTE — Telephone Encounter (Signed)
Noted by clinical staff. Will call patient once we fax paperwork off. Nothing further needed

## 2022-02-04 NOTE — Telephone Encounter (Signed)
Patient's son dropped off some paperwork for patient assistance.  Please review and call son to give him the status.  CB# [phone]

## 2022-02-08 ENCOUNTER — Other Ambulatory Visit: Payer: Self-pay | Admitting: Internal Medicine

## 2022-02-08 NOTE — Telephone Encounter (Signed)
Patient states she only saw this requesting provider and would like to have Dr Sarajane Jews prescribe and follow her for this medication.

## 2022-02-10 DIAGNOSIS — R5383 Other fatigue: Secondary | ICD-10-CM | POA: Diagnosis not present

## 2022-02-10 DIAGNOSIS — E018 Other iodine-deficiency related thyroid disorders and allied conditions: Secondary | ICD-10-CM | POA: Diagnosis not present

## 2022-02-10 DIAGNOSIS — R5382 Chronic fatigue, unspecified: Secondary | ICD-10-CM | POA: Diagnosis not present

## 2022-02-10 DIAGNOSIS — E559 Vitamin D deficiency, unspecified: Secondary | ICD-10-CM | POA: Diagnosis not present

## 2022-02-10 DIAGNOSIS — R947 Abnormal results of other endocrine function studies: Secondary | ICD-10-CM | POA: Diagnosis not present

## 2022-02-11 DIAGNOSIS — M48061 Spinal stenosis, lumbar region without neurogenic claudication: Secondary | ICD-10-CM | POA: Diagnosis not present

## 2022-02-11 DIAGNOSIS — Z9889 Other specified postprocedural states: Secondary | ICD-10-CM | POA: Diagnosis not present

## 2022-02-11 DIAGNOSIS — W03XXXA Other fall on same level due to collision with another person, initial encounter: Secondary | ICD-10-CM | POA: Diagnosis not present

## 2022-02-11 DIAGNOSIS — G629 Polyneuropathy, unspecified: Secondary | ICD-10-CM | POA: Diagnosis not present

## 2022-02-11 DIAGNOSIS — S3210XA Unspecified fracture of sacrum, initial encounter for closed fracture: Secondary | ICD-10-CM | POA: Diagnosis not present

## 2022-02-11 DIAGNOSIS — M545 Low back pain, unspecified: Secondary | ICD-10-CM | POA: Diagnosis not present

## 2022-02-11 NOTE — Telephone Encounter (Signed)
Clinical questions failed to populate in [facility], reached out to [facility] for assistance. Per rep the questions were failing to populate on their end as well. Reinitiated PA process over the phone and answered clinical questions. Rep states that we should be receiving a determination in 24-72 hours.  Ref# [id number] Direct phone line for updates: [phone]

## 2022-02-12 ENCOUNTER — Telehealth: Payer: Self-pay | Admitting: Family Medicine

## 2022-02-12 ENCOUNTER — Other Ambulatory Visit (HOSPITAL_COMMUNITY): Payer: Self-pay

## 2022-02-12 ENCOUNTER — Other Ambulatory Visit: Payer: Self-pay | Admitting: Internal Medicine

## 2022-02-12 DIAGNOSIS — C50819 Malignant neoplasm of overlapping sites of unspecified female breast: Secondary | ICD-10-CM | POA: Diagnosis not present

## 2022-02-12 DIAGNOSIS — G729 Myopathy, unspecified: Secondary | ICD-10-CM | POA: Diagnosis not present

## 2022-02-12 DIAGNOSIS — E063 Autoimmune thyroiditis: Secondary | ICD-10-CM | POA: Diagnosis not present

## 2022-02-12 DIAGNOSIS — E039 Hypothyroidism, unspecified: Secondary | ICD-10-CM | POA: Diagnosis not present

## 2022-02-12 DIAGNOSIS — R7309 Other abnormal glucose: Secondary | ICD-10-CM | POA: Diagnosis not present

## 2022-02-12 DIAGNOSIS — E559 Vitamin D deficiency, unspecified: Secondary | ICD-10-CM | POA: Diagnosis not present

## 2022-02-12 NOTE — Telephone Encounter (Signed)
Received notification from [facility] regarding a prior authorization for [place]. Dickey Gave is available without an authorization. Approval letter sent to scan center.  Per test claim, copay for 30 days supply is $3292.52  Patient can fill through [employer]: [phone]  Submitted Patient Assistance Application to [place] for [place] along with provider portion, patient portion, med list, insurance card copy, PA and income documents. Will update patient when we receive a response.  Fax# [phone] Phone# [phone]  Left VM with patient's son Christy Sartorius [phone]) advising of copay and that we are planning to submit PAP today  Knox Saliva, PharmD, MPH, BCPS, CPP Clinical Pharmacist (Rheumatology and Pulmonology)

## 2022-02-12 NOTE — Telephone Encounter (Signed)
Prescription Request  02/12/2022  Is this a "Controlled Substance" medicine? No  LOV: 02/01/2022  What is the name of the medication or equipment? atorvastatin (LIPITOR) 80 MG tablet   Have you contacted your pharmacy to request a refill? Yes   Which pharmacy would you like this sent to?   [facility] [address] [place], [place] [address] Phone: [phone] Fax: [phone]     Patient notified that their request is being sent to the clinical staff for review and that they should receive a response within 2 business days.   Please advise at Mobile [phone] (mobile)

## 2022-02-15 ENCOUNTER — Other Ambulatory Visit: Payer: Self-pay

## 2022-02-15 DIAGNOSIS — R2689 Other abnormalities of gait and mobility: Secondary | ICD-10-CM | POA: Diagnosis not present

## 2022-02-15 DIAGNOSIS — R531 Weakness: Secondary | ICD-10-CM | POA: Diagnosis not present

## 2022-02-15 DIAGNOSIS — R2681 Unsteadiness on feet: Secondary | ICD-10-CM | POA: Diagnosis not present

## 2022-02-15 DIAGNOSIS — E785 Hyperlipidemia, unspecified: Secondary | ICD-10-CM

## 2022-02-15 MED ORDER — ATORVASTATIN CALCIUM 80 MG PO TABS: 40.0000 mg | ORAL_TABLET | Freq: Every day | ORAL | 1 refills | Status: DC

## 2022-02-15 NOTE — Telephone Encounter (Signed)
Refill sent to [facility]

## 2022-02-18 NOTE — Telephone Encounter (Signed)
Refaxed Ofev PAP application to [facility] as it does not appear it was faxed (?)  Fax: [phone] Phone: [phone]  Knox Saliva, PharmD, MPH, BCPS, CPP Clinical Pharmacist (Rheumatology and Pulmonology)

## 2022-02-22 DIAGNOSIS — R531 Weakness: Secondary | ICD-10-CM | POA: Diagnosis not present

## 2022-02-22 DIAGNOSIS — R2681 Unsteadiness on feet: Secondary | ICD-10-CM | POA: Diagnosis not present

## 2022-02-22 DIAGNOSIS — R2689 Other abnormalities of gait and mobility: Secondary | ICD-10-CM | POA: Diagnosis not present

## 2022-02-22 NOTE — Telephone Encounter (Signed)
Received fax from [facility] stating that they were missing proof of income. Followed back up and learned that the two documents that were submitted were unfortunately not acceptable representations of income. They requested either the Social Security award letter or a copy of the 1040 Form.  Reached out to pt and discussed, she stated that she would be able to provide a copy of her 1040 Form. Will await delivery in order to resubmit.

## 2022-02-23 ENCOUNTER — Telehealth: Payer: Self-pay | Admitting: Pulmonary Disease

## 2022-02-23 NOTE — Telephone Encounter (Signed)
Received 2023 social security benefit statement from patient for Ofev assistance. Form will be given to pharmacy team.

## 2022-03-01 ENCOUNTER — Emergency Department (HOSPITAL_COMMUNITY): Payer: Medicare HMO

## 2022-03-01 ENCOUNTER — Ambulatory Visit: Payer: Medicare HMO | Admitting: Family Medicine

## 2022-03-01 ENCOUNTER — Emergency Department (HOSPITAL_COMMUNITY)
Admission: EM | Admit: 2022-03-01 | Discharge: 2022-03-01 | Disposition: A | Discharge status: Home / Self Care | Payer: Medicare HMO | Attending: Emergency Medicine | Admitting: Emergency Medicine

## 2022-03-01 ENCOUNTER — Encounter (HOSPITAL_COMMUNITY): Payer: Self-pay

## 2022-03-01 DIAGNOSIS — Z79899 Other long term (current) drug therapy: Secondary | ICD-10-CM | POA: Diagnosis not present

## 2022-03-01 DIAGNOSIS — M19012 Primary osteoarthritis, left shoulder: Secondary | ICD-10-CM | POA: Diagnosis not present

## 2022-03-01 DIAGNOSIS — R079 Chest pain, unspecified: Secondary | ICD-10-CM | POA: Diagnosis not present

## 2022-03-01 DIAGNOSIS — M25512 Pain in left shoulder: Secondary | ICD-10-CM | POA: Insufficient documentation

## 2022-03-01 DIAGNOSIS — R7989 Other specified abnormal findings of blood chemistry: Secondary | ICD-10-CM | POA: Diagnosis not present

## 2022-03-01 DIAGNOSIS — M542 Cervicalgia: Secondary | ICD-10-CM | POA: Diagnosis not present

## 2022-03-01 DIAGNOSIS — Z7982 Long term (current) use of aspirin: Secondary | ICD-10-CM | POA: Diagnosis not present

## 2022-03-01 DIAGNOSIS — I4891 Unspecified atrial fibrillation: Secondary | ICD-10-CM | POA: Insufficient documentation

## 2022-03-01 LAB — CBC
HCT: 38.8 % (ref 36.0–46.0)
Hemoglobin: 12.8 g/dL (ref 12.0–15.0)
MCH: 29.3 pg (ref 26.0–34.0)
MCHC: 33 g/dL (ref 30.0–36.0)
MCV: 88.8 fL (ref 80.0–100.0)
Platelets: 321 10*3/uL (ref 150–400)
RBC: 4.37 MIL/uL (ref 3.87–5.11)
RDW: 13.6 % (ref 11.5–15.5)
WBC: 8.8 10*3/uL (ref 4.0–10.5)
nRBC: 0 % (ref 0.0–0.2)

## 2022-03-01 LAB — BASIC METABOLIC PANEL
Anion gap: 14 (ref 5–15)
BUN: 12 mg/dL (ref 8–23)
CO2: 24 mmol/L (ref 22–32)
Calcium: 9.8 mg/dL (ref 8.9–10.3)
Chloride: 103 mmol/L (ref 98–111)
Creatinine, Ser: 0.89 mg/dL (ref 0.44–1.00)
GFR, Estimated: >60 mL/min (ref >60)
Glucose, Bld: 101 mg/dL — ABNORMAL HIGH (ref 70–99)
Potassium: 4 mmol/L (ref 3.5–5.1)
Sodium: 141 mmol/L (ref 135–145)

## 2022-03-01 LAB — TROPONIN I (HIGH SENSITIVITY)
Troponin I (High Sensitivity): 24 ng/L — ABNORMAL HIGH (ref <18)
Troponin I (High Sensitivity): 23 ng/L — ABNORMAL HIGH (ref <18)

## 2022-03-01 MED ORDER — LIDOCAINE 5 % EX PTCH
1.0000 | MEDICATED_PATCH | CUTANEOUS | Status: DC
  Administered 2022-03-01: 1 via TRANSDERMAL
  Filled 2022-03-01: qty 1

## 2022-03-01 MED ORDER — TRAMADOL HCL 50 MG PO TABS
100.0000 mg | ORAL_TABLET | Freq: Once | ORAL | Status: AC
  Administered 2022-03-01: 100 mg via ORAL
  Filled 2022-03-01: qty 2

## 2022-03-01 MED ORDER — PREDNISONE 10 MG PO TABS: ORAL_TABLET | ORAL | 0 refills | Status: AC

## 2022-03-01 MED ORDER — LIDOCAINE 5 % EX PTCH: 1.0000 | MEDICATED_PATCH | CUTANEOUS | 0 refills | Status: DC

## 2022-03-01 MED ORDER — PREDNISONE 5 MG PO TABS
50.0000 mg | ORAL_TABLET | Freq: Once | ORAL | Status: AC
  Administered 2022-03-01: 50 mg via ORAL
  Filled 2022-03-01: qty 2

## 2022-03-01 MED ORDER — HYDROMORPHONE HCL 1 MG/ML IJ SOLN: 0.5000 mg | Freq: Once | INTRAMUSCULAR | Status: DC

## 2022-03-01 MED ORDER — CYCLOBENZAPRINE HCL 10 MG PO TABS
5.0000 mg | ORAL_TABLET | Freq: Once | ORAL | Status: AC
  Administered 2022-03-01: 5 mg via ORAL
  Filled 2022-03-01: qty 1

## 2022-03-01 MED ORDER — TRAMADOL HCL 50 MG PO TABS: 50.0000 mg | ORAL_TABLET | Freq: Four times a day (QID) | ORAL | 0 refills | Status: DC | PRN

## 2022-03-01 NOTE — ED Provider Notes (Signed)
[facility] Provider Note   CSN: [id number] Arrival date & time: 03/01/22  1042     History  Chief Complaint  Patient presents with   Shoulder Pain   Neck Pain    Rachael [name] is a 89 y.o. female, history of gout, polymyalgia rheumatica, who presents to the ED secondary to left shoulder pain that is been going on for the last 3 days.  Rachael [name] states last Friday, Rachael [name] started to have severe left shoulder pain and that since then Rachael [name] has been unable to lift her arm up since then.  States gotten worse, now Rachael [name] is sleeping in a chair, because laying on it is severely painful.  Rachael [name] states that Rachael [name] typically lays on her left side when Rachael [name] sleeps, and denies any recent falls, states Rachael [name] woke up and the pain was there.  States Rachael [name] can even get her nightgown over her head because Rachael [name] cannot lift her left arm up anymore.  States that it feels very swollen and tight, and radiates from her neck to her elbow.  Denies any recent rashes, states that Rachael [name] does have a history of gout, which Rachael [name] has had to take prednisone in the past for, and typically affects her wrist, but is always on her left side.  Denies any chest pain or shortness of breath.     Home Medications Prior to Admission medications   Medication Sig Start Date End Date Taking? Authorizing Provider  lidocaine (LIDODERM) 5 % Place 1 patch onto the skin daily. Remove & Discard patch within 12 hours or as directed by MD 03/01/22  Yes Guerino Caporale L, PA  predniSONE (DELTASONE) 10 MG tablet Take 4 tablets (40 mg total) by mouth daily for 4 days, THEN 3 tablets (30 mg total) daily for 3 days, THEN 2 tablets (20 mg total) daily for 3 days, THEN 1 tablet (10 mg total) daily for 3 days. 03/01/22 03/14/22 Yes Belanna Manring L, PA  traMADol (ULTRAM) 50 MG tablet Take 1 tablet (50 mg total) by mouth every 6 (six) hours as needed. 03/01/22  Yes Ragna Kramlich L, PA  albuterol (VENTOLIN HFA) 108 (90 Base) MCG/ACT inhaler  Inhale 2 puffs into the lungs every 4 (four) hours as needed for wheezing or shortness of breath. 05/22/21   [name], MD  aspirin 81 MG EC tablet Take one every other day Patient taking differently: Take 81 mg by mouth every other day. 02/24/21   [name], MD  atorvastatin (LIPITOR) 80 MG tablet Take 0.5 tablets (40 mg total) by mouth daily. 02/15/22   [name], MD  budesonide-formoterol (SYMBICORT) 80-4.5 MCG/ACT inhaler Inhale 2 puffs into the lungs 2 (two) times daily. 06/01/21   Jeanie Sewer, NP  Cholecalciferol (VITAMIN D3 PO) Take 1 capsule by mouth daily.    [provider]  ciprofloxacin (CIPRO) 500 MG tablet Take 1 tablet (500 mg total) by mouth 2 (two) times daily. 02/01/22   [name], MD  colchicine 0.6 MG tablet Take 1 tablet (0.6 mg total) by mouth every 6 (six) hours as needed (gout). 08/18/21   [name], MD  Cyanocobalamin (B-12 PO) Take 1 tablet by mouth daily.     [provider]  furosemide (LASIX) 20 MG tablet Take 1 tablet (20 mg total) by mouth daily. Take one tablet once a day 11/30/21   [name], MD  levothyroxine (SYNTHROID) 112 MCG tablet Take 1 tablet (112  mcg total) by mouth daily before breakfast. 11/30/21   [name], MD  loperamide (IMODIUM A-D) 2 MG tablet Take 1 tablet (2 mg total) by mouth 4 (four) times daily as needed for diarrhea or loose stools. 01/22/22   Farrel Conners, MD  LORazepam (ATIVAN) 0.5 MG tablet Take 1 tablet (0.5 mg total) by mouth every 8 (eight) hours as needed for anxiety (to relax breathing). 01/13/22   [name], MD  ondansetron (ZOFRAN-ODT) 4 MG disintegrating tablet Take 1 tablet (4 mg total) by mouth every 8 (eight) hours as needed for nausea or vomiting (for nausea from wegovy or other source). 01/22/22   Farrel Conners, MD  OVER THE COUNTER MEDICATION Take 1 Dose by mouth daily. Juice Plus [employer]    [provider]      Allergies    Shellfish allergy,  Percocet [oxycodone-acetaminophen], Iodinated contrast media, and Iodine    Review of Systems   Review of Systems  Cardiovascular:  Negative for chest pain.  Musculoskeletal:  Positive for joint swelling and neck pain.  Skin:  Negative for rash.    Physical Exam Updated Vital Signs BP 111/82   Pulse 91   Temp 98.4 F (36.9 C) (Oral)   Resp 15   Ht '5\' 6"'$  (1.676 m)   Wt 88 kg   SpO2 97%   BMI 31.31 kg/m  Physical Exam Vitals and nursing note reviewed.  Constitutional:      General: Rachael [name] is not in acute distress.    Appearance: Rachael [name] is well-developed.  HENT:     Head: Normocephalic and atraumatic.  Eyes:     Conjunctiva/sclera: Conjunctivae normal.  Cardiovascular:     Rate and Rhythm: Normal rate. Rhythm irregular.     Heart sounds: No murmur heard. Pulmonary:     Effort: Pulmonary effort is normal. No respiratory distress.     Breath sounds: Normal breath sounds.  Abdominal:     Palpations: Abdomen is soft.     Tenderness: There is no abdominal tenderness.  Musculoskeletal:        General: Swelling present.     Cervical back: Neck supple.     Comments: Left shoulder/arm: Tenderness to palpation along left trapezius, left humeral head, down to the left elbow.  Impaired abduction adduction, unable to lift arm past 15 degrees.  Is able to use external rotation, internal rotation is not intact.  Pain with passive range of motion, and impaired.  Grip strength intact.  No rash.  There is mild swelling along the humeral head, without any ecchymosis.    Skin:    General: Skin is warm and dry.     Capillary Refill: Capillary refill takes less than 2 seconds.  Neurological:     Mental Status: Rachael [name] is alert.  Psychiatric:        Mood and Affect: Mood normal.     ED Results / Procedures / Treatments   Labs (all labs ordered are listed, but only abnormal results are displayed) Labs Reviewed  BASIC METABOLIC PANEL - Abnormal; Notable for the following components:      Result  Value   Glucose, Bld 101 (*)    All other components within normal limits  TROPONIN I (HIGH SENSITIVITY) - Abnormal; Notable for the following components:   Troponin I (High Sensitivity) 24 (*)    All other components within normal limits  TROPONIN I (HIGH SENSITIVITY) - Abnormal; Notable for the following components:   Troponin I (High  Sensitivity) 23 (*)    All other components within normal limits  CBC  URINALYSIS, ROUTINE W REFLEX MICROSCOPIC    EKG None  Radiology DG Shoulder Left  Result Date: 03/01/2022 CLINICAL DATA:  A 89 year old female presents for evaluation of pain with movement in the LEFT shoulder. EXAM: LEFT SHOULDER - 2+ VIEW COMPARISON:  Jun 10, 2021 FINDINGS: No sign of acute fracture.  Osteopenia. Slight added density over the inferior margin of the inferior glenoid. finding seen on only one view moderate acromioclavicular and glenohumeral degenerative changes. No sign of dislocation. IMPRESSION: 1. Slight added density on the AP projection over the inferior glenoid. Seen on only one view, of uncertain significance perhaps overlapping soft tissues though difficult to exclude early loose bodies or Beni Turrell joint effusion. 2. Moderate acromioclavicular and glenohumeral degenerative changes. 3. No sign of acute fracture or dislocation. Electronically Signed   By: Zetta Bills M.D.   On: 03/01/2022 12:40   DG Chest 2 View  Result Date: 03/01/2022 CLINICAL DATA:  pain with movement EXAM: CHEST - 2 VIEW COMPARISON:  06/10/2021 FINDINGS: Cardiac silhouette enlarged. No evidence of pneumothorax or pleural effusion. No evidence of pulmonary edema. There are thoracic degenerative changes. Aorta is calcified. IMPRESSION: Enlarged cardiac silhouette.  Lungs are clear. Electronically Signed   By: Sammie Bench M.D.   On: 03/01/2022 12:35    Procedures Procedures    Medications Ordered in ED Medications  lidocaine (LIDODERM) 5 % 1 patch (1 patch Transdermal Patch Applied  03/01/22 1357)  predniSONE (DELTASONE) tablet 50 mg (50 mg Oral Given 03/01/22 1356)  cyclobenzaprine (FLEXERIL) tablet 5 mg (5 mg Oral Given 03/01/22 1355)  traMADol (ULTRAM) tablet 100 mg (100 mg Oral Given 03/01/22 1356)    ED Course/ Medical Decision Making/ A&P         CHA2DS2-VASc Score: 8                    Medical Decision Making Patient is an 89 y.o. female, here for shoulder pain for the last few days. Has mild swelling on exam, impaired ROM. Troponins ordered by triage. Will obtain shoulder xray. Also found to be in afib---which patient states Rachael [name] has never been in.  Amount and/or Complexity of Data Reviewed Labs: ordered.    Details: Mildly elevated troponins of 24,23 Radiology: ordered.    Details: CXR clear, shoulder xray difficult to determine if osseous body loose or OA Discussion of management or test interpretation with external provider(s): Discussed with pt, CHADS-VASc score of 8, high risk, however, states Rachael [name] falls frequently as well, we discussed fall risk on eliquis vs stroke risk (10ish%), Rachael [name] would prefer to follow-up with the afib clinic. Does not want to start eliquis at this time. Unknown when afib started. Not symptomatic. Referral information provided.  Additionally, suspicious for possible gout vs OA, after lidocaine patch, tramadol, prednisone, pt able to move arm again. Discharged w/prednisone, tramadol and lidocaine patches for pain control. Return precautions emphasized.   Risk Prescription drug management.   Final Clinical Impression(s) / ED Diagnoses Final diagnoses:  Acute pain of left shoulder  Atrial fibrillation, unspecified type ([place])    Rx / DC Orders ED Discharge Orders          Ordered    predniSONE (DELTASONE) 10 MG tablet  Daily        03/01/22 1632    lidocaine (LIDODERM) 5 %  Every 24 hours        03/01/22 1632  traMADol (ULTRAM) 50 MG tablet  Every 6 hours PRN        03/01/22 1632              Mukesh Kornegay, Si Gaul,  PA 03/01/22 2018    Pattricia Boss, MD 03/08/22 [phone]

## 2022-03-01 NOTE — Telephone Encounter (Signed)
Pt dropped off copy of 1099 form which is acceptable documentation of income per application. Submitted to [facility], will await further determination.

## 2022-03-01 NOTE — ED Notes (Signed)
Patient transported to X-ray 

## 2022-03-01 NOTE — ED Triage Notes (Signed)
Pt c/o left shoulder, neck pain to back since Friday. Pt states she had to sleep sitting up.  Pt states she has also had the "shakes". Denies N/V/D. Denies cough.

## 2022-03-01 NOTE — Discharge Instructions (Addendum)
Please follow-up with your primary care doctor, and the A-fib clinic. Please call the afib clinic tomorrow AM. Today you are found to have A-fib, however you are asymptomatic, we discussed possible blood thinners, versus waiting until being seen at the A-fib clinic given your fall risk, you chose to wait, please return to the ER, if you develop severe shortness of breath, chest pain, weakness on one side of the body.

## 2022-03-02 ENCOUNTER — Telehealth: Payer: Self-pay | Admitting: Family Medicine

## 2022-03-02 ENCOUNTER — Telehealth: Payer: Self-pay | Admitting: Pharmacist

## 2022-03-02 MED ORDER — OFEV 150 MG PO CAPS: 150.0000 mg | ORAL_CAPSULE | Freq: Two times a day (BID) | ORAL | 1 refills | Status: DC

## 2022-03-02 NOTE — Telephone Encounter (Signed)
This has been submitted to [facility] and we are pending determination from patient assistance  Knox Saliva, PharmD, MPH, BCPS, CPP Clinical Pharmacist (Rheumatology and Pulmonology)

## 2022-03-02 NOTE — Telephone Encounter (Signed)
Received a fax from  [facility] regarding an approval for [place] patient assistance from 03/01/2022 to 01/11/2023. Approval letter sent to scan center.  Phone number: [phone] I called patient's son, Rachael [name] and spoke with him about approval. Reviewed renewal process. Provided him with phone number to schedule shipment to home. He confirms that he packs weekly pillbox for his mother. He and his sister are jointly ensuring full understanding of plan of care by accompanying patient on all f/u appointments.  Patient's son, Rachael [name], counseled on purpose, proper use, and potential adverse effects including diarrhea, nausea, vomiting, abdominal pain, decreased appetite, weight loss, and increased blood pressure. Stressed the importance of routine lab monitoring. Will monitor LFT's every month for the first 6 months of treatment then every 3 months. Ofev dose will be 150 mg capsule every 12 hours with food. Stressed importance of taking with food to minimize stomach upset. Reviewed that we could reduce dose to 148m twice daily if intolerable or recurrent side effects.  He verbalized understanding to all of hte above. He will let [place] if any issues with getting set up.  DKnox Saliva PharmD, MPH, BCPS, CPP Clinical Pharmacist (Rheumatology and Pulmonology)

## 2022-03-02 NOTE — Telephone Encounter (Signed)
Received 2023 social security benefit statement from patient for Ofev assistance. Form will be given to pharmacy team.     Pt. Son came and dropped off this on 2.13.24 wants to know where they stand about getting meds for mother

## 2022-03-02 NOTE — Telephone Encounter (Signed)
Error. Please disregard

## 2022-03-04 DIAGNOSIS — R2681 Unsteadiness on feet: Secondary | ICD-10-CM | POA: Diagnosis not present

## 2022-03-04 DIAGNOSIS — R2689 Other abnormalities of gait and mobility: Secondary | ICD-10-CM | POA: Diagnosis not present

## 2022-03-04 DIAGNOSIS — R531 Weakness: Secondary | ICD-10-CM | POA: Diagnosis not present

## 2022-03-08 ENCOUNTER — Telehealth: Payer: Self-pay

## 2022-03-08 ENCOUNTER — Encounter: Payer: Self-pay | Admitting: Family Medicine

## 2022-03-08 ENCOUNTER — Ambulatory Visit (INDEPENDENT_AMBULATORY_CARE_PROVIDER_SITE_OTHER): Payer: Medicare HMO | Admitting: Family Medicine

## 2022-03-08 ENCOUNTER — Telehealth: Payer: Self-pay | Admitting: Family Medicine

## 2022-03-08 ENCOUNTER — Ambulatory Visit: Payer: Medicare HMO | Admitting: Family Medicine

## 2022-03-08 VITALS — BP 102/62 | HR 96 | Temp 97.7°F | Wt 193.0 lb

## 2022-03-08 DIAGNOSIS — I4891 Unspecified atrial fibrillation: Secondary | ICD-10-CM

## 2022-03-08 DIAGNOSIS — M25511 Pain in right shoulder: Secondary | ICD-10-CM

## 2022-03-08 DIAGNOSIS — M810 Age-related osteoporosis without current pathological fracture: Secondary | ICD-10-CM

## 2022-03-08 MED ORDER — APIXABAN 5 MG PO TABS: 5.0000 mg | ORAL_TABLET | Freq: Two times a day (BID) | ORAL | 0 refills | Status: DC

## 2022-03-08 MED ORDER — METOPROLOL SUCCINATE ER 25 MG PO TB24: 25.0000 mg | ORAL_TABLET | Freq: Every day | ORAL | 0 refills | Status: DC

## 2022-03-08 NOTE — Telephone Encounter (Signed)
Spoke with [employer], gave VO for new Rx for Metoprolol XR due to pharmacy escribe being down, state that they will notify pt when Rx is ready for pick up

## 2022-03-08 NOTE — Progress Notes (Signed)
   Subjective:    Patient ID: Rachael [name], female    DOB: Aug 12, 1933, 89 y.o.   MRN: [id number]  HPI Here with her son for a transitional care visit to follow up on an ED visit on 03-01-22 for acute left shoulder pain with no recent trauma. Xrays showed degenerative changes throughout the shoulder. This was felt to be a gout attack, and she was started on a Prednisone taper beginning with 40 mg a day. The shoulder pain has almost completely resolved now. Incidentally she was also found to be in atrial fibrillation, which she has never been in before. Her BP was 111/82 and HR was 91. Her primary cardiologist is Dr. Rex Kras. She denies having any recent palpitations or chest pain or SOB. Currently she is taking 81 mg of ASA twice a week.    Review of Systems  Constitutional: Negative.   Respiratory: Negative.    Cardiovascular: Negative.   Musculoskeletal:  Positive for arthralgias.       Objective:   Physical Exam Constitutional:      Appearance: Normal appearance.     Comments: Walks with a walker   Cardiovascular:     Rate and Rhythm: Tachycardia present. Rhythm irregular.     Pulses: Normal pulses.     Heart sounds: Normal heart sounds.     Comments: EKG shows atrial fibrillation  Pulmonary:     Effort: Pulmonary effort is normal.     Breath sounds: Normal breath sounds.  Musculoskeletal:     Comments: 1+ edema in both ankles   Neurological:     Mental Status: She is alert and oriented to person, place, and time.           Assessment & Plan:  She is recovering from a gout attack in the left shoulder, and she will finish up the Prednisone taper. She also has newly diagnosed atrial fibrillation. We discussed the nature of this condition and its implications. We gave her samples to start on Eliquis 5 mg BID, and we told her not to take any ASA or other NSAID's. She has some mild resting tachycardia so we will start her on Metoprolol succinate 25 mg daily. She is scheduled  to see Dr. Terri Skains on 03-11-22. We spent a total of ( 35  ) minutes reviewing records and discussing these issues.  Alysia Penna, MD

## 2022-03-08 NOTE — Telephone Encounter (Signed)
Pt son is calling and [facility] please [facility]

## 2022-03-08 NOTE — Telephone Encounter (Signed)
        Patient  visited [facility] on 2/19   Telephone encounter attempt :  1st  A HIPAA compliant voice message was left requesting a return call.  Instructed patient to call back    [facility] [phone] [address], [place], [place] [address] Phone: [phone] Email: [email or web address]

## 2022-03-08 NOTE — Telephone Encounter (Signed)
Pt was seen today and transmission for  metoprolol succinate (TOPROL-XL) 25 MG 24 hr tablet failed please resend to  [facility] # [address] [place], [place] Phone: [phone]  Fax: [phone]

## 2022-03-09 ENCOUNTER — Telehealth: Payer: Self-pay

## 2022-03-09 DIAGNOSIS — I5030 Unspecified diastolic (congestive) heart failure: Secondary | ICD-10-CM | POA: Diagnosis not present

## 2022-03-09 DIAGNOSIS — R0602 Shortness of breath: Secondary | ICD-10-CM | POA: Diagnosis not present

## 2022-03-09 DIAGNOSIS — J84112 Idiopathic pulmonary fibrosis: Secondary | ICD-10-CM | POA: Diagnosis not present

## 2022-03-09 DIAGNOSIS — I482 Chronic atrial fibrillation, unspecified: Secondary | ICD-10-CM | POA: Diagnosis not present

## 2022-03-09 DIAGNOSIS — J8410 Pulmonary fibrosis, unspecified: Secondary | ICD-10-CM | POA: Diagnosis not present

## 2022-03-09 NOTE — Telephone Encounter (Signed)
        Patient  visited [facility] on 2/19    Telephone encounter attempt 2nd  A HIPAA compliant voice message was left requesting a return call.  Instructed patient to call back .   [facility] [phone] [address], [place], [place] [address] Phone: [phone] Email: [email or web address]

## 2022-03-10 ENCOUNTER — Ambulatory Visit (HOSPITAL_COMMUNITY): Payer: Medicare HMO | Admitting: Nurse Practitioner

## 2022-03-11 ENCOUNTER — Ambulatory Visit: Payer: Medicare HMO | Admitting: Cardiology

## 2022-03-11 ENCOUNTER — Encounter: Payer: Self-pay | Admitting: Cardiology

## 2022-03-11 VITALS — BP 151/77 | HR 100 | Resp 19 | Ht 66.0 in | Wt 194.0 lb

## 2022-03-11 DIAGNOSIS — J8410 Pulmonary fibrosis, unspecified: Secondary | ICD-10-CM | POA: Diagnosis not present

## 2022-03-11 DIAGNOSIS — I495 Sick sinus syndrome: Secondary | ICD-10-CM | POA: Diagnosis not present

## 2022-03-11 DIAGNOSIS — I5032 Chronic diastolic (congestive) heart failure: Secondary | ICD-10-CM | POA: Diagnosis not present

## 2022-03-11 DIAGNOSIS — E782 Mixed hyperlipidemia: Secondary | ICD-10-CM

## 2022-03-11 DIAGNOSIS — I700 Atherosclerosis of aorta: Secondary | ICD-10-CM | POA: Diagnosis not present

## 2022-03-11 DIAGNOSIS — I4819 Other persistent atrial fibrillation: Secondary | ICD-10-CM

## 2022-03-11 DIAGNOSIS — G4730 Sleep apnea, unspecified: Secondary | ICD-10-CM

## 2022-03-11 DIAGNOSIS — Z7901 Long term (current) use of anticoagulants: Secondary | ICD-10-CM

## 2022-03-11 DIAGNOSIS — I10 Essential (primary) hypertension: Secondary | ICD-10-CM | POA: Diagnosis not present

## 2022-03-11 MED ORDER — APIXABAN 5 MG PO TABS: 5.0000 mg | ORAL_TABLET | Freq: Two times a day (BID) | ORAL | 0 refills | Status: DC

## 2022-03-11 MED ORDER — FLECAINIDE ACETATE 50 MG PO TABS: 50.0000 mg | ORAL_TABLET | Freq: Two times a day (BID) | ORAL | 0 refills | Status: DC

## 2022-03-11 MED ORDER — DAPAGLIFLOZIN PROPANEDIOL 10 MG PO TABS: 10.0000 mg | ORAL_TABLET | Freq: Every day | ORAL | 0 refills | Status: AC

## 2022-03-11 NOTE — Progress Notes (Signed)
ID:  Rachael [name], DOB 04/22/1933, MRN [id number]  PCP:  [name], MD  Cardiologist:  Rex Kras, DO, [facility] (established care 04/09/2021) Former Cardiology Providers: Dr. Ali Lowe and Dr. Lovena Le Pulmonary Provider: Dr. Marshell Garfinkel.   Date: 03/11/22 Last Office Visit: 05/05/2021  Chief Complaint  Patient presents with   Atrial Fibrillation   Hospitalization Follow-up    HPI  Rachael [name] is a 89 y.o. Caucasian female whose past medical history and cardiovascular risk factors include: pulmonary fibrosis likely idiopathic and possible UIP pattern, Sleep Apnea w/ nocturnal desaturation, hypertension,, aortic atherosclerosis, hypothyroidism, hyperlipidemia, HFpEF, sinus node dysfunction, hx of breast cancer, advanced age, postmenopausal female.  Initially referred to the practice for bradycardia/SVT.  Prior to establishing care she has already been evaluated by Dr. Lovena Le and there was concerns for tachybradycardia syndrome and sinus node dysfunction which will eventually require pacemaker implant but clinically not needed at that time.  She is being managed for heart failure with preserved EF and did undergo ischemic workup as outlined below.  She was on diuretic therapy to help manage her symptoms.  Since then she is lost to follow-up and has been diagnosed with pulmonary fibrosis suggestive of UIP pattern, sleep apnea, nocturnal hypoxia.  She recently had a ER visit on 03/01/2022 for left shoulder pain likely secondary to degenerative changes/gout attack.  During that ER visit EKG was performed which noted atrial fibrillation which is a new finding for the patient.  She followed up with PCP and now referred back to cardiology for further evaluation and management.  She denies anginal discomfort or heart failure symptoms.  She is tolerating metoprolol and Eliquis well but is requesting refill on the Eliquis.  Accompanied by her daughter Herbert Spires at today's office visit.  FUNCTIONAL  STATUS: Ambulates with a walker.   ALLERGIES: Allergies  Allergen Reactions   Shellfish Allergy Anaphylaxis and Hives    Hives all over the body. Hives all over the body   Percocet [Oxycodone-Acetaminophen] Hives   Iodinated Contrast Media Other (See Comments)    PT IS NOT AWARE OF IODINE ALLERGY, PREMEDICATED FOR PRIOR PREMEDS ONLY//A.C. - unknown reaction   Iodine Hives    MEDICATION LIST PRIOR TO VISIT: Current Meds  Medication Sig   albuterol (VENTOLIN HFA) 108 (90 Base) MCG/ACT inhaler Inhale 2 puffs into the lungs every 4 (four) hours as needed for wheezing or shortness of breath.   atorvastatin (LIPITOR) 80 MG tablet Take 0.5 tablets (40 mg total) by mouth daily.   Cholecalciferol (VITAMIN D3 PO) Take 1 capsule by mouth daily.   Cyanocobalamin (B-12 PO) Take 1 tablet by mouth daily.    dapagliflozin propanediol (FARXIGA) 10 MG TABS tablet Take 1 tablet (10 mg total) by mouth daily before breakfast.   flecainide (TAMBOCOR) 50 MG tablet Take 1 tablet (50 mg total) by mouth 2 (two) times daily.   furosemide (LASIX) 20 MG tablet Take 1 tablet (20 mg total) by mouth daily. Take one tablet once a day   levothyroxine (SYNTHROID) 112 MCG tablet Take 1 tablet (112 mcg total) by mouth daily before breakfast.   LORazepam (ATIVAN) 0.5 MG tablet Take 1 tablet (0.5 mg total) by mouth every 8 (eight) hours as needed for anxiety (to relax breathing).   metoprolol succinate (TOPROL-XL) 25 MG 24 hr tablet Take 1 tablet (25 mg total) by mouth daily.   Nintedanib (OFEV) 150 MG CAPS Take 1 capsule (150 mg total) by mouth 2 (two) times daily.  ondansetron (ZOFRAN-ODT) 4 MG disintegrating tablet Take 1 tablet (4 mg total) by mouth every 8 (eight) hours as needed for nausea or vomiting (for nausea from wegovy or other source).   OVER THE COUNTER MEDICATION Take 1 Dose by mouth daily. Juice Plus Berry Blend   predniSONE (DELTASONE) 10 MG tablet Take 4 tablets (40 mg total) by mouth daily for 4 days,  THEN 3 tablets (30 mg total) daily for 3 days, THEN 2 tablets (20 mg total) daily for 3 days, THEN 1 tablet (10 mg total) daily for 3 days.   traMADol (ULTRAM) 50 MG tablet Take 1 tablet (50 mg total) by mouth every 6 (six) hours as needed.   [DISCONTINUED] apixaban (ELIQUIS) 5 MG TABS tablet Take 1 tablet (5 mg total) by mouth 2 (two) times daily.     PAST MEDICAL HISTORY: Past Medical History:  Diagnosis Date   Anxiety state 11/14/2007   Qualifier: Diagnosis of  By: Jenny Reichmann MD, Hunt Oris    Breast cancer [facility])    R   CAROTID STENOSIS    Chronic venous insufficiency    CONTACT DERMATITIS    DUPUYTREN'S CONTRACTURE    Edema    pt  states had edema of feet and legs has been on lasix per dr. to help   GLUCOSE INTOLERANCE    HLD (hyperlipidemia)    Hypothyroidism    Dr. Ala Dach in [place]    Obesity    Osteoarthritis    bilat hips, severe   PVD (peripheral vascular disease) ([place])    mild carotid 11/05   VITAMIN D DEFICIENCY     PAST SURGICAL HISTORY: Past Surgical History:  Procedure Laterality Date   2D echo  11/29/2003   normal LV size. normal EF    BACK SURGERY  12/06/2016   L3-L5 laminectomies per Dr. Maryellen Pile at [place]  05/11/2011   right breast   BREAST LUMPECTOMY     right breast   [place]     incisional hernia   INGUINAL HERNIA REPAIR     JOINT REPLACEMENT Bilateral    Dr. Maureen Ralphs   normal caronary arteries     per pt. 2003   right rotator cuff surgery     [place]  05/2008 and 10/2008   B hip - alusio    FAMILY HISTORY: The patient family history includes Breast cancer (age of onset: 12) in her daughter; Cancer in her daughter and mother; Clotting disorder in her maternal grandmother; Colon cancer in her cousin; Coronary artery disease in an other family member; Diabetes in her son; Heart disease in her  brother and father; Hypothyroidism in an other family member; Leukemia in her cousin; Ovarian cancer in her mother; Pancreatic cancer in her maternal grandfather; Prostate cancer in her cousin, maternal uncle, and paternal uncle; Uterine cancer in her cousin; Uterine cancer (age of onset: 78) in her mother; Varicose Veins in her father.  SOCIAL HISTORY:  The patient  reports that she has never smoked. She has been exposed to tobacco smoke. She has never used smokeless tobacco. She reports that she does not drink alcohol and does not use drugs.  REVIEW OF SYSTEMS: Review of Systems  Cardiovascular:  Positive for dyspnea on exertion (chronic  and stable) and leg swelling. Negative for chest pain,  orthopnea, palpitations and syncope.  Respiratory:  Positive for shortness of breath (chronic and stable).     PHYSICAL EXAM:    03/11/2022    9:35 AM 03/08/2022   10:33 AM 03/01/2022    3:30 PM  Vitals with BMI  Height '5\' 6"'$     Weight 194 lbs 193 lbs   BMI [id number]   Systolic [id number]  Diastolic 77 62 82  Pulse 123XX123 96 91   Physical Exam  Constitutional: No distress.  Age appropriate, hemodynamically stable, walks w/ walker  Neck: No JVD present.  Cardiovascular: Normal rate, S1 normal, S2 normal, intact distal pulses and normal pulses. An irregularly irregular rhythm present. Exam reveals no gallop, no S3 and no S4.  No murmur heard. Pulmonary/Chest: Effort normal and breath sounds normal. No stridor. She has no wheezes. Rales at the bases  Abdominal: Soft. Bowel sounds are normal. She exhibits no distension. There is no abdominal tenderness.  Musculoskeletal:        General: Edema present.     Cervical back: Neck supple.  Neurological: She is alert and oriented to person, place, and time. She has intact cranial nerves (2-12).  Skin: Skin is warm and moist.     CARDIAC DATABASE: EKG: 03/01/2022: Atrial fibrillation, 100 bpm. 03/08/2022: Atrial fibrillation, 85 bpm, PVC, without  underlying injury pattern. 03/11/2022: Atrial fibrillation, 64 bpm.  Echocardiogram: 11/24/2020: LVEF 60-65%, no regional wall motion abnormalities, mild LVH, grade 1 diastolic dysfunction, moderately dilated left atrium, trivial MR, trivial AR, aortic root at 39 mm, proximal ascending aorta 40 mm, estimated RAP 3 mmHg, small PFO with left-to-right shunting.   Stress Testing: Lexiscan Nuclear stress test 05/13/2021: Nondiagnostic ECG stress. The heart rate response was consistent with Lexiscan.  Myocardial perfusion is normal. Normal left ventricle. LV rest volume 109 ml. LV stress volume 128 ml. TID ratio 1.17. Overall LV systolic function is mildly abnormal without regional wall motion abnormalities. Stress LV EF: 44%. Global hypokinesis of the left ventricle. No previous exam available for comparison. Intermediate risk study due to reduced LVEF. Correlate with echocardiogram.   Heart Catheterization: None  Cardiac monitor: November 2022 Patch Wear Time:  3 days and 23 hours    Patient had a min HR of 38 bpm, max HR of 171 bpm, and avg HR of 57 bpm.    Predominant underlying rhythm was Sinus Rhythm.    EVENTS: 43 Supraventricular Tachycardia runs occurred, the run with the fastest interval lasting 7 beats with a max rate of 171 bpm, the longest lasting 1 min 20 secs with an avg rate of 139 bpm.    3 Pauses occurred, the longest lasting 3.1 secs (19 bpm).  3 pauses were noted during the monitoring period. 11/19/2020 at 6:28 AM 3.1-second pause. 11/18/2020 at 4:36 PM 3.1-second pause. 11/18/2020 at 3:14 PM 3-second pause.   Sinus bradycardia burden 38%.     Some episodes of Supraventricular Tachycardia may be possible Atrial Tachycardia with variable block. Isolated SVEs were rare (<1.0%), SVE Couplets were rare (<1.0%), and SVE Triplets were rare (<1.0%). Isolated VEs were rare (<1.0%, 729), VE Couplets were rare (<1.0%, 10), and VE Triplets were rare (<1.0%, 2). Ventricular Trigeminy  was present.   No atrial fibrillation or ventricular arrhythmias were detected.  Carotid duplex 02/26/2018: Bilateral ICA stenosis 1-39%, bilateral vertebral antegrade flows normal bilateral subclavian flows.  No significant change compared to study from 2015.   LABORATORY DATA:    Latest Ref Rng & Units 03/01/2022  11:26 AM 01/10/2022    9:55 AM 11/20/2021    9:18 AM  CBC  WBC 4.0 - 10.5 K/uL 8.8  11.6  12.7   Hemoglobin 12.0 - 15.0 g/dL 12.8  12.4  12.3   Hematocrit 36.0 - 46.0 % 38.8  38.7  36.7   Platelets 150 - 400 K/uL 321  298  396.0        Latest Ref Rng & Units 03/01/2022   11:26 AM 01/10/2022    9:55 AM 11/20/2021    9:18 AM  CMP  Glucose 70 - 99 mg/dL 101  110  90   BUN 8 - 23 mg/dL '12  16  15   '$ Creatinine 0.44 - 1.00 mg/dL 0.89  0.93  0.79   Sodium 135 - 145 mmol/L 141  141  141   Potassium 3.5 - 5.1 mmol/L 4.0  3.9  4.4   Chloride 98 - 111 mmol/L 103  105  105   CO2 22 - 32 mmol/L '24  27  26   '$ Calcium 8.9 - 10.3 mg/dL 9.8  9.4  10.1   Total Protein 6.5 - 8.1 g/dL  6.8  6.6   Total Bilirubin 0.3 - 1.2 mg/dL  1.3  1.1   Alkaline Phos 38 - 126 U/L  80  97   AST 15 - 41 U/L  25  15   ALT 0 - 44 U/L  17  8     Lipid Panel  Lab Results  Component Value Date   CHOL 127 11/20/2021   HDL 51.10 11/20/2021   LDLCALC 61 11/20/2021   LDLDIRECT 115.8 11/03/2011   TRIG 76.0 11/20/2021   CHOLHDL 2 11/20/2021    No components found for: "NTPROBNP" Recent Labs    04/09/21 1302 05/12/21 1259  PROBNP 1,048* 718   Recent Labs    03/19/21 0925 11/20/21 0918  TSH 0.70 0.40    BMP Recent Labs    06/11/21 0719 11/20/21 0918 01/10/22 0955 03/01/22 1126  NA 139 141 141 141  K 3.9 4.4 3.9 4.0  CL 108 105 105 103  CO2 21* '26 27 24  '$ GLUCOSE 158* 90 110* 101*  BUN '16 15 16 12  '$ CREATININE 1.01* 0.79 0.93 0.89  CALCIUM 9.8 10.1 9.4 9.8  GFRNONAA 55*  --  60* >60    HEMOGLOBIN A1C Lab Results  Component Value Date   HGBA1C 5.9 11/20/2021   MPG 100  02/25/2018    IMPRESSION:    ICD-10-CM   1. Persistent atrial fibrillation (HCC)  I48.19 EKG 12-Lead    flecainide (TAMBOCOR) 50 MG tablet    apixaban (ELIQUIS) 5 MG TABS tablet    2. Long term (current) use of anticoagulants  Z79.01 apixaban (ELIQUIS) 5 MG TABS tablet    Hemoglobin and hematocrit, blood    3. Chronic heart failure with preserved ejection fraction (HFpEF) (HCC)  I50.32 dapagliflozin propanediol (FARXIGA) 10 MG TABS tablet    Basic metabolic panel    Magnesium    Pro b natriuretic peptide (BNP)    4. Essential hypertension  I10     5. Pulmonary fibrosis ([place])  J84.10     6. Sleep apnea in adult  G47.30     7. Atherosclerosis of aorta (HCC)  I70.0     8. Mixed hyperlipidemia  E78.2     9. Sinus node dysfunction (HCC)  I49.5        RECOMMENDATIONS: Rachael [name] is a 89 y.o. Caucasian female whose  past medical history and cardiac risk factors include: pulmonary fibrosis likely idiopathic and possible UIP pattern, Sleep Apnea w/ nocturnal desaturation, hypertension,, aortic atherosclerosis, hypothyroidism, hyperlipidemia, HFpEF, sinus node dysfunction, hx of breast cancer, advanced age, postmenopausal female.  Persistent atrial fibrillation (HCC) Rate control: Metoprolol. Rhythm control: Flecainide. Thromboembolic prophylaxis: Eliquis No prior history of gastrointestinal or intracranial bleed. Had 1 fall in October 2023 which could have been avoidable. I would like to see her back in 4 weeks to reevaluate her rhythm.  If she converts with flecainide no additional workup will be needed for her A-fib otherwise we will discuss direct-current cardioversion. Refilled Eliquis CHA2DS2-VASc SCORE is 6 which correlates to 9.7% risk of stroke per year.  Long term (current) use of anticoagulants Indication: Persistent atrial fibrillation. Risks, benefits, and alternatives to anticoagulation discussed with both patient and daughter at today's office visit.  Chronic  heart failure with preserved ejection fraction (HFpEF) (HCC) Stage B, NYHA class II/III No recent hospitalizations for congestive heart failure. For reasons unknown her Wilder Glade and other heart failure medications were discontinued-likely due to it being cost prohibitive. Will restart Wilder Glade for now. Labs in 1 week to evaluate kidney function and electrolytes. Will add Entresto at the next office visit. She did have bilateral lower extremity swelling patient states that she did not take her diuretics this morning she had come to the office  Essential hypertension Office blood pressures are not well-controlled. Medications need to be uptitrated. Reemphasized importance of low-salt diet.  Pulmonary fibrosis (HCC) Currently follows up with Dr. [name] Is doing well on the current pharmacological therapy.  Sleep apnea in adult Reemphasized importance of device compliance.  As part of today's office visit reviewed hospitalization records, last PCP note, pulmonary progress notes, prior EKGs from February 2024, labs independently reviewed, discussed initiation/risks/benefits of anticoagulation.  FINAL MEDICATION LIST END OF ENCOUNTER: Meds ordered this encounter  Medications   flecainide (TAMBOCOR) 50 MG tablet    Sig: Take 1 tablet (50 mg total) by mouth 2 (two) times daily.    Dispense:  60 tablet    Refill:  0   apixaban (ELIQUIS) 5 MG TABS tablet    Sig: Take 1 tablet (5 mg total) by mouth 2 (two) times daily.    Dispense:  14 tablet    Refill:  0   dapagliflozin propanediol (FARXIGA) 10 MG TABS tablet    Sig: Take 1 tablet (10 mg total) by mouth daily before breakfast.    Dispense:  90 tablet    Refill:  0    Medications Discontinued During This Encounter  Medication Reason   budesonide-formoterol (SYMBICORT) 80-4.5 MCG/ACT inhaler No longer needed (for PRN medications)   lidocaine (LIDODERM) 5 % No longer needed (for PRN medications)   loperamide (IMODIUM A-D) 2 MG tablet No  longer needed (for PRN medications)   apixaban (ELIQUIS) 5 MG TABS tablet Reorder     Current Outpatient Medications:    albuterol (VENTOLIN HFA) 108 (90 Base) MCG/ACT inhaler, Inhale 2 puffs into the lungs every 4 (four) hours as needed for wheezing or shortness of breath., Disp: 18 g, Rfl: 2   atorvastatin (LIPITOR) 80 MG tablet, Take 0.5 tablets (40 mg total) by mouth daily., Disp: 90 tablet, Rfl: 1   Cholecalciferol (VITAMIN D3 PO), Take 1 capsule by mouth daily., Disp: , Rfl:    Cyanocobalamin (B-12 PO), Take 1 tablet by mouth daily. , Disp: , Rfl:    dapagliflozin propanediol (FARXIGA) 10 MG TABS tablet, Take 1 tablet (  10 mg total) by mouth daily before breakfast., Disp: 90 tablet, Rfl: 0   flecainide (TAMBOCOR) 50 MG tablet, Take 1 tablet (50 mg total) by mouth 2 (two) times daily., Disp: 60 tablet, Rfl: 0   furosemide (LASIX) 20 MG tablet, Take 1 tablet (20 mg total) by mouth daily. Take one tablet once a day, Disp: 90 tablet, Rfl: 3   levothyroxine (SYNTHROID) 112 MCG tablet, Take 1 tablet (112 mcg total) by mouth daily before breakfast., Disp: 90 tablet, Rfl: 3   LORazepam (ATIVAN) 0.5 MG tablet, Take 1 tablet (0.5 mg total) by mouth every 8 (eight) hours as needed for anxiety (to relax breathing)., Disp: 90 tablet, Rfl: 1   metoprolol succinate (TOPROL-XL) 25 MG 24 hr tablet, Take 1 tablet (25 mg total) by mouth daily., Disp: 30 tablet, Rfl: 0   Nintedanib (OFEV) 150 MG CAPS, Take 1 capsule (150 mg total) by mouth 2 (two) times daily., Disp: 180 capsule, Rfl: 1   ondansetron (ZOFRAN-ODT) 4 MG disintegrating tablet, Take 1 tablet (4 mg total) by mouth every 8 (eight) hours as needed for nausea or vomiting (for nausea from wegovy or other source)., Disp: 30 tablet, Rfl: 0   OVER THE COUNTER MEDICATION, Take 1 Dose by mouth daily. Juice Plus [employer], Disp: , Rfl:    predniSONE (DELTASONE) 10 MG tablet, Take 4 tablets (40 mg total) by mouth daily for 4 days, THEN 3 tablets (30 mg total)  daily for 3 days, THEN 2 tablets (20 mg total) daily for 3 days, THEN 1 tablet (10 mg total) daily for 3 days., Disp: 34 tablet, Rfl: 0   traMADol (ULTRAM) 50 MG tablet, Take 1 tablet (50 mg total) by mouth every 6 (six) hours as needed., Disp: 10 tablet, Rfl: 0   apixaban (ELIQUIS) 5 MG TABS tablet, Take 1 tablet (5 mg total) by mouth 2 (two) times daily., Disp: 14 tablet, Rfl: 0  Orders Placed This Encounter  Procedures   Basic metabolic panel   Magnesium   Pro b natriuretic peptide (BNP)   Hemoglobin and hematocrit, blood   EKG 12-Lead    There are no Patient Instructions on file for this visit.   --Continue cardiac medications as reconciled in final medication list. --Return in about 4 weeks (around 04/08/2022) for A. fib, EKG on arrival.. Or sooner if needed. --Continue follow-up with your primary care physician regarding the management of your other chronic comorbid conditions.  Patient's questions and concerns were addressed to her satisfaction. She voices understanding of the instructions provided during this encounter.   This note was created using a voice recognition software as a result there may be grammatical errors inadvertently enclosed that do not reflect the nature of this encounter. Every attempt is made to correct such errors.  Rex Kras, [place], [facility]  Pager: [phone] Office: [phone]

## 2022-03-12 ENCOUNTER — Inpatient Hospital Stay: Payer: Medicare HMO | Admitting: Family Medicine

## 2022-03-16 ENCOUNTER — Other Ambulatory Visit: Payer: Self-pay

## 2022-03-16 DIAGNOSIS — Z7901 Long term (current) use of anticoagulants: Secondary | ICD-10-CM

## 2022-03-16 DIAGNOSIS — I4819 Other persistent atrial fibrillation: Secondary | ICD-10-CM

## 2022-03-16 MED ORDER — METOPROLOL SUCCINATE ER 25 MG PO TB24: 25.0000 mg | ORAL_TABLET | Freq: Every day | ORAL | 1 refills | Status: DC

## 2022-03-16 MED ORDER — APIXABAN 5 MG PO TABS: 5.0000 mg | ORAL_TABLET | Freq: Two times a day (BID) | ORAL | 1 refills | Status: DC

## 2022-03-17 ENCOUNTER — Other Ambulatory Visit: Payer: Self-pay

## 2022-03-17 ENCOUNTER — Telehealth: Payer: Self-pay

## 2022-03-17 DIAGNOSIS — R531 Weakness: Secondary | ICD-10-CM | POA: Diagnosis not present

## 2022-03-17 DIAGNOSIS — Z7901 Long term (current) use of anticoagulants: Secondary | ICD-10-CM

## 2022-03-17 DIAGNOSIS — R2681 Unsteadiness on feet: Secondary | ICD-10-CM | POA: Diagnosis not present

## 2022-03-17 DIAGNOSIS — R2689 Other abnormalities of gait and mobility: Secondary | ICD-10-CM | POA: Diagnosis not present

## 2022-03-17 DIAGNOSIS — I10 Essential (primary) hypertension: Secondary | ICD-10-CM

## 2022-03-17 DIAGNOSIS — I4819 Other persistent atrial fibrillation: Secondary | ICD-10-CM

## 2022-03-17 DIAGNOSIS — I5031 Acute diastolic (congestive) heart failure: Secondary | ICD-10-CM

## 2022-03-17 MED ORDER — APIXABAN 5 MG PO TABS: 5.0000 mg | ORAL_TABLET | Freq: Two times a day (BID) | ORAL | 1 refills | Status: DC

## 2022-03-17 NOTE — Telephone Encounter (Signed)
Spoke with patients daughter. She needed the patients medication sent into [facility]. I sent this in for them.

## 2022-03-18 ENCOUNTER — Telehealth: Payer: Self-pay

## 2022-03-18 NOTE — Telephone Encounter (Signed)
Patients son is calling because eliquis is to too expensive. He wants to know if theres an alternative? He is also going to fill out patient assistance for the qliuis to see if they can get approved.

## 2022-03-19 NOTE — Telephone Encounter (Signed)
Xarelto as an alternative. Coumadin will definitely be affordable but will need frequent INR checks. She can fill out a patient assistance form for either Eliquis or Xarelto to see if she would qualify for financial assistance  [place], DO, [facility]

## 2022-03-19 NOTE — Telephone Encounter (Signed)
Patient assistance forms for Eliquis and Xarelto completed and faxed per Lilia Pro.

## 2022-03-22 ENCOUNTER — Telehealth: Payer: Self-pay | Admitting: Pulmonary Disease

## 2022-03-22 NOTE — Telephone Encounter (Signed)
Patient states Rachael [name] is causing her to loose appetite. Also made voice hoarse. Patient phone number is [phone].

## 2022-03-22 NOTE — Telephone Encounter (Signed)
Ofev is not known to cause voice hoarseness. Patient states she has had less appetite. Denies diarrhea or GI side effects. Denies any changes to hydration or increase in cough that would have caused hoarsness  Patient should hold Ofev x 2 weeks and resume with 1 cap once daily x 4 days then increase to 1 capsule twice daily.  If symptoms recur, will need to consider another drug holiday then re-challenge at lower dose of Ofev '100mg'$  twice daily.  Spoke with patient regarding plan above. She verbalized understanding.  Knox Saliva, PharmD, MPH, BCPS, CPP Clinical Pharmacist (Rheumatology and Pulmonology)

## 2022-03-23 ENCOUNTER — Telehealth: Payer: Self-pay | Admitting: Family Medicine

## 2022-03-23 ENCOUNTER — Telehealth: Payer: Self-pay | Admitting: Pulmonary Disease

## 2022-03-23 NOTE — Telephone Encounter (Signed)
Rory son would like directions for patient's Ofev. States patient does not remember directions. Rory phone number is [phone].

## 2022-03-23 NOTE — Telephone Encounter (Signed)
Spoke with [place] the Glass blower/designer, she stated that she will take care of this issue, pt was notified

## 2022-03-23 NOTE — Telephone Encounter (Signed)
Returned call to [employer], patient's son and explained plan below:  Patient should hold Ofev x 2 weeks and resume with 1 cap once daily x 4 days then increase to 1 capsule twice daily.   If symptoms recur, will need to consider another drug holiday then re-challenge at lower dose of Ofev '100mg'$  twice daily.  Advised him we will needs LFTs checked 1 month after she restarts. He verbalized understanding.   Knox Saliva, PharmD, MPH, BCPS, CPP Clinical Pharmacist (Rheumatology and Pulmonology)

## 2022-03-23 NOTE — Telephone Encounter (Signed)
Pt called to say she received a no show letter for the 03/01/22 appointment.  Pt states she did not make this appt, and does not know what the appt was for. Pt is asking if someone could look into who made this appt and why. Pt states she should not be a no show for this appointment and wants her record corrected.

## 2022-03-24 DIAGNOSIS — R2681 Unsteadiness on feet: Secondary | ICD-10-CM | POA: Diagnosis not present

## 2022-03-24 DIAGNOSIS — R2689 Other abnormalities of gait and mobility: Secondary | ICD-10-CM | POA: Diagnosis not present

## 2022-03-24 DIAGNOSIS — R531 Weakness: Secondary | ICD-10-CM | POA: Diagnosis not present

## 2022-03-25 ENCOUNTER — Telehealth: Payer: Self-pay | Admitting: Family Medicine

## 2022-03-25 NOTE — Progress Notes (Signed)
Patient ID: Rachael [name], female   DOB: 1933-05-08, 89 y.o.   MRN: [id number]  Care Management & Coordination Services Pharmacy Team  Chart prep for initial encounter with Rachael [name] 03/30/22 at 11:30  Spoke with patient on 03/26/2022   Have you seen any other providers since your last visit? Yes Patient reports she recently saw dr [name].  Any changes in your medications or health? Patient reports she was changed recently to [facility] and she does not like it due to sore throat and loss of appetite. She reports all of her other medications seem to be fine no issues.  Do you have an symptoms or problems not managed by your medications? Patient reports no.  Any concerns about your health right now? Patient reports none  Has your provider asked that you check blood pressure, blood sugar, or follow special diet at home? Patient reports she has a blood pressure cuff she got from [employer] she uses regularly, will bring with her to the appointment.  Do you get any type of exercise on a regular basis? Patient reports she is Using a Walker now and therapy with the goal of not having to use it, she reports she has gotten back feeling in her feet and legs and is excited about that.  Can you think of a goal you would like to reach for your health? Patient would like to be without the use of the walker and back to line dancing.   Do you have any problems getting your medications? Patient reports no  Patient reports there Patient assistance in process for her breathing medications.   Patient aware to bring blood pressure cuff, medications that do not need refrigeration and supplements to appointment  Chart review:  Recent [name] visits:  03/08/22 Rachael Morale, MD - Patient presented for Atrial fibrillation unspecified and other concerns. Prescribed Apixaban. Prescribed Metoprolol. Stopped Asprin. Stopped Ciprofloxacin. Stopped Colchicine.   02/01/22 Rachael Morale, MD - Patient presented  for Urinary Tract Infection and other concerns. Prescribed Cipro.  01/22/22 Farrel Conners, MD - Patient presented for Nausea and vomiting and other concerns. Prescribed Loperamide. Prescribed Zofran.  01/13/22 Rachael Morale, MD  -Patient presented for Right flank pain and other concerns. Prescribed Hydrocodone-Acetaminophen.   11/30/21 Rachael Morale, MD - Patient presented for Intractable acute post-traumatic headache and other concerns.   11/20/21 Rachael Morale, MD - Patient presented for Acute bilateral low back pain unspecified whether sciatica present and other concerns. Prescribed Hydrocodone- Acetaminophen.   11/13/21 Rachael Morale, MD - Patient presented for lumbar pain and other concerns. Prescribed Tramadol.  11/06/21 Burchette, Alinda Sierras, MD - Patient presented for Closed head injury and other concerns. No medication changes.   10/29/21 Rachael Morale, MD - Patient presented for Dyslipidemia. Decreased Atorvastatin. Stopped Azithromycin.   10/01/21 Rachael Morale, MD - Patient presented for Acute bronchitis with COPD and other concerns. Prescribed Azithromycin. Stopped [place]. Stopped Oxybutyin. Stopped Prednisone.    Recent consult visits:  03/11/22 Rex Kras, DO (Cardiology) - Patient presented for Persistent atrial fibrillation and other concerns. Prescribed Farxiga. Prescribed Flecainide. Stopped Budesonide. Stopped Lidocaine. Stopped Loperamide.   03/10/22 Charlie Pitter, MD  - Patient presented to [place] for HFpEF and atrial fibrillation. No medication changes.  04/02/22 Narda Rutherford, DO   - Patient presented to [place] for HFpEF and atrial fibrillation. No medication changes.  03/09/22 Charlie Pitter, MD  -  Patient presented to [place] for Shortness of breath. No medication changes.   02/22/22 Through, Break - Claims encounter for Other abnormalities of gait and other mobility and  other concerns. No other visit details available.  02/11/22 Sima Matas, MD - Patient presented for Lumbar spine pain. No medication changes.   02/02/22 Marshell Garfinkel, MD (Pulmonology) - Patient presented for ILD and other concerns. No medication changes.  11/30/21 Patient presented to [facility] for CT Heat W/O Contrast & CT Angio Chest PE W/CM &/OR WO  10/27/21 Snapper, Laban Emperor, MD  ([place]) - Patient presented for ILD. No medication changes.   10/27/21 Huang, [place] encounter for shortness of breath. No other visit details available.   10/26/21 Chestine Spore, MD - Patient presented to [facility] for Acquired palmoplantar keratoderma and other concerns. No medication changes.   10/21/21 Linus Mako - Claims encounter for Varicose veins of left lower extremity with other complications. No other visit details available.   10/12/21 Dian Queen - Claims encounter for Screening for malignant neoplasm of cervix and other concerns. No other visit details available.     Hospital visits:  Medication Reconciliation was completed by comparing discharge summary, patient's EMR and Pharmacy list, and upon discussion with patient.  Patient presented to [facility] ED at [facility] on 03/01/22 due to Acute pain of left shoulder. Patient was present for 6 hours.  New?Medications Started at [facility] Discharge:?? -started  lidocaine 5 % predniSONE 10 MG  Medication Changes at Hospital Discharge: -Changed  traMADol 50 MG  Medications Discontinued at Hospital Discharge: -Stopped  None   Medications that remain the same after Hospital Discharge:??  -All other medications will remain the same.     Patient presented to [facility] ED at [facility] on 01/10/22 due to right flank pain. Patient was present for 2 hours.  New?Medications Started at [facility] Discharge:?? -started  lidocaine 5 %  Medication Changes at  Hospital Discharge: -Changed  none  Medications Discontinued at Hospital Discharge: -Stopped  None   Medications that remain the same after Hospital Discharge:??  -All other medications will remain the same.      Fill History : albuterol sulfate HFA 90 mcg/actuation aerosol inhaler 02/05/2022 25   Eliquis 5 mg tablet 03/16/2022 90   atorvastatin 80 mg tablet 02/15/2022 90   colchicine (gout) 0.6 mg tablet 08/18/2021 15   Farxiga 10 mg tablet 09/02/2021 30   flecainide 50 mg tablet 03/11/2022 30   fluticasone propionate 50 mcg/actuation nasal spray,suspension 02/20/2021 30   furosemide 20 mg tablet 11/30/2021 90   levothyroxine 112 mcg tablet 01/12/2022 90   lorazepam 0.5 mg tablet 01/13/2022 30   metoprolol succinate ER 25 mg tablet,extended release 24 hr 03/08/2022 30   ondansetron 4 mg disintegrating tablet 01/22/2022 10   tramadol 50 mg tablet 03/01/2022 3   Star Rating Drugs:  Atorvastatin 80 mg - Last filled 02/15/22 90 DS at [place] 10 mg - Last filled 09/02/21 30 DS at [place]: AVW - Overdue Flu Vaccine - Postponed PNA Vaccine - [place] Pharmacist Assistant [phone]

## 2022-03-25 NOTE — Progress Notes (Signed)
Care Management & Coordination Services Pharmacy Note  03/25/2022 Name:  Rachael [name] MRN:  [id number] DOB:  February 08, 1933  Summary: -Recent dx of A-fib, started on Eliquis and Flecainide, pap for eliquis is waiting for response -Prescribed farxiga for heart failure but had not started due to cost, awaiting PAP -BP 99/65 in office today, denies any dizziness or fatigue -Holding OFEV due to concerns it may be causing hoarseness, per instruction of Pulm, interested in resuming Esbriet instead  Recommendations/Changes made from today's visit: -Samples of Farxiga and Eliquis provided in office while patient waits for PAP approval.  -Check BP daily for next week and keep a log -Will reach out to Pulm to discuss possibility of restarting Esbriet in place of OFEV -Enrolled in [facility] med sync, packaging and delivery. Services reviewed and patient consents.  Follow up plan: BP in 1 week Pharmacist visit in 3 months   Subjective: Rachael [name] is an 89 y.o. year old female who is a primary patient of [name], MD.  The care coordination team was consulted for assistance with disease management and care coordination needs.    Engaged with patient face to face for initial visit. Lives with son Christy Sartorius who helps manage her medications. 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 Walks with assistance of a walker currently but is attending PT and goal is to walk without assistance and resume one of her favorite hobbies, line dancing. Does have a hoarse voice today that has potentially been caused by OFEV treatment, is being held for next 2 weeks.  Recent office visits: 03/08/22 [name], MD - Patient presented for Atrial fibrillation unspecified and other concerns. Prescribed Apixaban. Prescribed Metoprolol. Stopped Asprin. Stopped Ciprofloxacin. Stopped Colchicine.    02/01/22 [name], MD - Patient presented for Urinary Tract Infection and other concerns.  Prescribed Cipro.   01/22/22 Farrel Conners, MD - Patient presented for Nausea and vomiting and other concerns. Prescribed Loperamide. Prescribed Zofran.   01/13/22 [name], MD  -Patient presented for Right flank pain and other concerns. Prescribed Hydrocodone-Acetaminophen.    11/30/21 [name], MD - Patient presented for Intractable acute post-traumatic headache and other concerns.    11/20/21 [name], MD - Patient presented for Acute bilateral low back pain unspecified whether sciatica present and other concerns. Prescribed Hydrocodone- Acetaminophen.    11/13/21 [name], MD - Patient presented for lumbar pain and other concerns. Prescribed Tramadol.   11/06/21 Burchette, Alinda Sierras, MD - Patient presented for Closed head injury and other concerns. No medication changes.    10/29/21 [name], MD - Patient presented for Dyslipidemia. Decreased Atorvastatin. Stopped Azithromycin.    10/01/21 [name], MD - Patient presented for Acute bronchitis with COPD and other concerns. Prescribed Azithromycin. Stopped [place]. Stopped Oxybutyin. Stopped Prednisone.   Recent consult visits: 03/11/22 Rex Kras, DO (Cardiology) - Patient presented for Persistent atrial fibrillation and other concerns. Prescribed Farxiga. Prescribed Flecainide. Stopped Budesonide. Stopped Lidocaine. Stopped Loperamide.    03/10/22 Charlie Pitter, MD  - Patient presented to [place] for HFpEF and atrial fibrillation. No medication changes.   04/02/22 Narda Rutherford, DO   - Patient presented to [place] for HFpEF and atrial fibrillation. No medication changes.   03/09/22 Charlie Pitter, MD  - Patient presented to [place] for Shortness of breath. No medication changes.    02/22/22 Through, Break -  Claims encounter for Other abnormalities of gait and other mobility and other concerns. No other visit details  available.   02/11/22 Sima Matas, MD - Patient presented for Lumbar spine pain. No medication changes.    02/02/22 Marshell Garfinkel, MD (Pulmonology) - Patient presented for ILD and other concerns. No medication changes.   11/30/21 Patient presented to [facility] for CT Heat W/O Contrast & CT Angio Chest PE W/CM &/OR WO   10/27/21 Snapper, Laban Emperor, MD  ([place]) - Patient presented for ILD. No medication changes.    10/27/21 Huang, [place] encounter for shortness of breath. No other visit details available.    10/26/21 Chestine Spore, MD - Patient presented to [facility] for Acquired palmoplantar keratoderma and other concerns. No medication changes.    10/21/21 Linus Mako - Claims encounter for Varicose veins of left lower extremity with other complications. No other visit details available.    10/12/21 Dian Queen - Claims encounter for Screening for malignant neoplasm of cervix and other concerns. No other visit details available.     Hospital visits: 03/01/22 For Acute left shoulder pain. [place] ED. LOS 6 hours. START Lidocaine 5% patch, Prednisone 10mg . Changed tramadol 50mg  dose  01/10/22 For right flank pain. [place] ED. LOS 2 hours. START Lidocaine 5% patch   Objective:  Lab Results  Component Value Date   CREATININE 0.89 03/01/2022   BUN 12 03/01/2022   GFR 67.96 11/20/2021   EGFR 60 05/12/2021   GFRNONAA >60 03/01/2022   GFRAA >60 02/26/2018   NA 141 03/01/2022   K 4.0 03/01/2022   CALCIUM 9.8 03/01/2022   CO2 24 03/01/2022   GLUCOSE 101 (H) 03/01/2022    Lab Results  Component Value Date/Time   HGBA1C 5.9 11/20/2021 09:18 AM   HGBA1C 6.1 03/19/2021 09:25 AM   GFR 67.96 11/20/2021 09:18 AM   GFR 51.46 (L) 03/19/2021 09:25 AM    Last diabetic Eye exam: No results found for: "HMDIABEYEEXA"  Last diabetic Foot exam: No results found for: "HMDIABFOOTEX"   Lab Results   Component Value Date   CHOL 127 11/20/2021   HDL 51.10 11/20/2021   LDLCALC 61 11/20/2021   LDLDIRECT 115.8 11/03/2011   TRIG 76.0 11/20/2021   CHOLHDL 2 11/20/2021       Latest Ref Rng & Units 01/10/2022    9:55 AM 11/20/2021    9:18 AM 06/11/2021    7:19 AM  Hepatic Function  Total Protein 6.5 - 8.1 g/dL 6.8  6.6  6.3   Albumin 3.5 - 5.0 g/dL 3.5  3.7  3.0   AST 15 - 41 U/L 25  15  24    ALT 0 - 44 U/L 17  8  15    Alk Phosphatase 38 - 126 U/L 80  97  67   Total Bilirubin 0.3 - 1.2 mg/dL 1.3  1.1  1.1   Bilirubin, Direct 0.0 - 0.3 mg/dL  0.3      Lab Results  Component Value Date/Time   TSH 0.40 11/20/2021 09:18 AM   TSH 0.70 03/19/2021 09:25 AM   FREET4 1.85 (H) 03/19/2021 09:25 AM   FREET4 1.15 10/28/2020 09:52 AM       Latest Ref Rng & Units 03/01/2022   11:26 AM 01/10/2022    9:55 AM 11/20/2021    9:18 AM  CBC  WBC 4.0 - 10.5 K/uL 8.8  11.6  12.7   Hemoglobin 12.0 - 15.0  g/dL 12.8  12.4  12.3   Hematocrit 36.0 - 46.0 % 38.8  38.7  36.7   Platelets 150 - 400 K/uL 321  298  396.0     Lab Results  Component Value Date/Time   VD25OH 39.25 11/20/2021 09:18 AM   VD25OH 24.35 (L) 09/09/2014 03:32 PM   VITAMINB12 1,149 (H) 11/20/2021 09:18 AM   VITAMINB12 989 (H) 03/18/2015 08:01 PM    Clinical ASCVD: Yes  The ASCVD Risk score (Arnett DK, et al., 2019) failed to calculate for the following reasons:   The 2019 ASCVD risk score is only valid for ages 66 to 50       03/08/2022   10:40 AM 02/01/2022    4:33 PM 11/30/2021   10:29 AM  Depression screen PHQ 2/9  Decreased Interest 0 0 0  Down, Depressed, Hopeless 0 0 0  PHQ - 2 Score 0 0 0  Altered sleeping 0 0 0  Tired, decreased energy 0 1 1  Change in appetite 1 0 3  Feeling bad or failure about yourself  0 0 0  Trouble concentrating 0 0 0  Moving slowly or fidgety/restless 0 0 0  Suicidal thoughts 0 0 0  PHQ-9 Score 1 1 4   Difficult doing work/chores Not difficult at all Not difficult at all Somewhat  difficult     Social History   Tobacco Use  Smoking Status Never   Passive exposure: Past  Smokeless Tobacco Never   BP Readings from Last 3 Encounters:  03/11/22 (!) 151/77  03/08/22 102/62  03/01/22 111/82   Pulse Readings from Last 3 Encounters:  03/11/22 100  03/08/22 96  03/01/22 91   Wt Readings from Last 3 Encounters:  03/11/22 194 lb (88 kg)  03/08/22 193 lb (87.5 kg)  03/01/22 194 lb (88 kg)   BMI Readings from Last 3 Encounters:  03/11/22 31.31 kg/m  03/08/22 31.15 kg/m  03/01/22 31.31 kg/m    Allergies  Allergen Reactions   Shellfish Allergy Anaphylaxis and Hives    Hives all over the body. Hives all over the body   Percocet [Oxycodone-Acetaminophen] Hives   Iodinated Contrast Media Other (See Comments)    PT IS NOT AWARE OF IODINE ALLERGY, PREMEDICATED FOR PRIOR PREMEDS ONLY//A.C. - unknown reaction   Iodine Hives    Medications Reviewed Today     Reviewed by Matthew Saras, LPN (Licensed Practical Nurse) on 03/11/22 at [phone]  Med List Status: <None>   Medication Order Taking? Sig Documenting Provider Last Dose Status Informant  albuterol (VENTOLIN HFA) 108 (90 Base) MCG/ACT inhaler [id number] Yes Inhale 2 puffs into the lungs every 4 (four) hours as needed for wheezing or shortness of breath. [name], MD Taking Active Self, Pharmacy Records  apixaban [facility]) 5 MG TABS tablet [id number] Yes Take 1 tablet (5 mg total) by mouth 2 (two) times daily. [name], MD Taking Active   atorvastatin (LIPITOR) 80 MG tablet [id number] Yes Take 0.5 tablets (40 mg total) by mouth daily. [name], MD Taking Active   Cholecalciferol (VITAMIN D3 PO) [id number] Yes Take 1 capsule by mouth daily. [provider] Taking Active Child, Self  Cyanocobalamin (B-12 PO) [id number] Yes Take 1 tablet by mouth daily.  [provider] Taking Active Child, Self  furosemide (LASIX) 20 MG tablet [id number] Yes Take 1 tablet (20 mg total) by mouth  daily. Take one tablet once a day [name], MD Taking Active   levothyroxine (SYNTHROID) 112 MCG  tablet [id number] Yes Take 1 tablet (112 mcg total) by mouth daily before breakfast. [name], MD Taking Active   LORazepam (ATIVAN) 0.5 MG tablet [id number] Yes Take 1 tablet (0.5 mg total) by mouth every 8 (eight) hours as needed for anxiety (to relax breathing). [name], MD Taking Active   metoprolol succinate (TOPROL-XL) 25 MG 24 hr tablet [id number] Yes Take 1 tablet (25 mg total) by mouth daily. [name], MD Taking Active   Nintedanib (OFEV) 150 MG CAPS [id number] Yes Take 1 capsule (150 mg total) by mouth 2 (two) times daily. Marshell Garfinkel, MD Taking Active   ondansetron (ZOFRAN-ODT) 4 MG disintegrating tablet [id number] Yes Take 1 tablet (4 mg total) by mouth every 8 (eight) hours as needed for nausea or vomiting (for nausea from wegovy or other source). Farrel Conners, MD Taking Active   OVER THE COUNTER MEDICATION [id number] Yes Take 1 Dose by mouth daily. [employer] [provider] Taking Active Self, Child  predniSONE (DELTASONE) 10 MG tablet [id number] Yes Take 4 tablets (40 mg total) by mouth daily for 4 days, THEN 3 tablets (30 mg total) daily for 3 days, THEN 2 tablets (20 mg total) daily for 3 days, THEN 1 tablet (10 mg total) daily for 3 days. Small, Brooke L, PA Taking Active   traMADol (ULTRAM) 50 MG tablet [id number] Yes Take 1 tablet (50 mg total) by mouth every 6 (six) hours as needed. Small, Si Gaul, PA Taking Active             SDOH:  (Social Determinants of Health) assessments and interventions performed: Yes   Medication Assistance:  PAP application for Eliquis, Farxiga in process through [employer] and cardio office. Samples provided of both today while patient awaits response.  Medication Access: Within the past 30 days, how often has patient missed a dose of medication? None Is a pillbox or other method used to improve  adherence? Yes  Factors that may affect medication adherence? financial need Are meds synced by current pharmacy? No  Are meds delivered by current pharmacy? No  Does patient experience delays in picking up medications due to transportation concerns? No   [facility] Reviewed: Is patient disadvantaged to use [facility]?: No  Current Rx insurance plan: [employer] Name and location of Current pharmacy:  [facility] # [place], [place] [address] Phone: [phone] Fax: [phone] [facility] services reviewed with patient today?: Yes  Patient requests to transfer care to [facility]?: Yes    Compliance/Adherence/Medication fill history: Care Gaps: AWV Due 05/20/22  Star-Rating Drugs: Atorvastatin 80mg  PDC 92% Farxiga 10mg  PDC 33% - awaiting PAP enrollment, samples provided today for patient to start   Assessment/Plan   Hypertension (BP goal <140/90) -Controlled -Current treatment: Lasix 20mg  1 qd Appropriate, Effective, Safe, Accessible Metoprolol XL 25mg  1 qd Appropriate, Effective, Safe, Accessible -Medications previously tried: Triamterene/HCTZ  -Current home readings: did not bring a log, not checking frequently -Current dietary habits: not discussed today -Current exercise habits: PT once weekly -Denies hypotensive/hypertensive symptoms -Educated on BP goals and benefits of medications for prevention of heart attack, stroke and kidney damage; Importance of home blood pressure monitoring; Proper BP monitoring technique; Symptoms of hypotension and importance of maintaining adequate hydration; -Counseled to monitor BP at home daliy, document, and provide log at future appointments -Recommended to continue current medication  Hyperlipidemia: (LDL goal < 100) -Controlled -Current treatment:  Atorvastatin 80mg  1/2 tab daily Appropriate, Effective, Safe, Accessible -Medications  previously tried: None  -Educated on Cholesterol goals;  Benefits of statin for ASCVD risk reduction; -Recommended to continue current medication  Heart Failure (Goal: manage symptoms and prevent exacerbations) -Controlled -Last ejection fraction: 60-65% (Date: 11/24/20) -HF type: HFpEF (EF > 50%) -NYHA Class: II (slight limitation of activity) -AHA HF Stage: C (Heart disease and symptoms present) -Current treatment: Farxiga 10mg  1 qd Appropriate, Query Effective (hasn't started yet due to cost) Lasix 20mg  1 qd Appropriate, Effective, Safe, Accessible Metoprolol XL 25mg  1 qd Appropriate, Effective, Safe, Accessible -Medications previously tried: Triamterene/HCTZ  -Current home BP/HR readings: see above -Current home daily weights: Not discussed -Educated on Benefits of medications for managing symptoms and prolonging life Importance of blood pressure control -Recommended to start [place] 10mg  daily as prescribed (samples provided in office today x 2 weeks while they await PAP response) -BP 99/65 in office today, counseled patient to check BP daily for next week to make sure Farxiga with mild BP lowering effects due to increased urinary output is well-tolerated.  Emphysema/ILD (Goal: control symptoms and prevent exacerbations) -Not assessed today -Current treatment  Albuterol HFA prn Appropriate, Effective, Safe, Accessible Ofev 1 BID Appropriate, Effective, Query Safe - holding due to side effect of hoarseness, interested in going back to Esbriet -Of note, CPAP moisture was recently decreased and patient may be getting dried out and patient made aware. Still wants to see if she can go back on Esbriet if possible, will reach out to Dr. Matilde Bash office to discuss. -Medications previously tried: Duoneb  -Frequency of rescue inhaler use: Can't remember last time she had to use   CAD/Hx of TIA/Afib (Goal: Slow progression of atherosclerosis (plaques / blockages) throughout your body to  reduce risk of heart attack and strokes) [place] controlled/uncontrolled: Controlled Current Medication Therapy:  Eliquis 5mg  BID Appropriate, Effective, Safe, Query Accessible  Flecainide 50mg  1 BID Appropriate, Effective, Safe, Accessible Medications Previously Tried: Chest Pain in last 3 months: No Any signs of bleed? No -Do not miss a dose of medication, samples provided today whle we wait for PAP reponse.  Hypothyroidism (Goal: TSH WNL) -Not assessed today -Current treatment  Levothyroxine 112 mcg 1 qd Appropriate, Effective, Safe, Accessible   OTC -Current treatment  Calcium + Vit D Appropriate, Effective, Safe, Accessible   Maren Reamer Clinical Pharmacist [phone]

## 2022-03-29 DIAGNOSIS — R2681 Unsteadiness on feet: Secondary | ICD-10-CM | POA: Diagnosis not present

## 2022-03-29 DIAGNOSIS — R531 Weakness: Secondary | ICD-10-CM | POA: Diagnosis not present

## 2022-03-29 DIAGNOSIS — R2689 Other abnormalities of gait and mobility: Secondary | ICD-10-CM | POA: Diagnosis not present

## 2022-03-30 ENCOUNTER — Telehealth: Payer: Self-pay

## 2022-03-30 ENCOUNTER — Ambulatory Visit: Payer: Medicare HMO

## 2022-03-30 ENCOUNTER — Telehealth: Payer: Self-pay | Admitting: Pulmonary Disease

## 2022-03-30 NOTE — Telephone Encounter (Signed)
Spoke with pt's son Rachael [name] who states pt was originally on Esbriet but due to side effects she was having, pt was switched to [place]. Now pt is having side effects to the OFEV (hoarseness of voice and sore throat) and due to this, pt does not want to be on OFEV anymore but does want to go back on the [place].   Routing to Dr. [name] as well as routing to pharmacy team. Please advise on this.

## 2022-03-30 NOTE — Telephone Encounter (Signed)
  Patient would like to restart Esbriet in place of Ofev.  Can patient's son Christy Sartorius be contacted with instructions for this and if this would be appropriate/get restart instructions if so?  Discussed the following: Met with patient and son in office today to review medications and concerns. Clinical Pharmacist embedded in PCP office for review.  Patient is interested in trying to go back on the [place] in place of the Ofev, which is currently being held due to concerns for hoarseness. Did discuss that this is not a known side effect and could be due to recent decrease in CPAP moisture flow.  Patient feels that her stomach issues and loss of appetite may be due to her recent stop of prednisone and she would prefer to retry Esbriet.  Contact Info: Christy Sartorius [phone]  Thank you! [place] Pharmacist [phone]

## 2022-03-31 NOTE — Telephone Encounter (Signed)
Spoke with [employer]. Patient is still enrolled into patient assistance program if we switch back to Esbriet and they would need a new prescription.   I talked to patient and her son, Christy Sartorius (who Dr. [name] talked to earlier this morning). The patient states she'd rater have the side effects that she had with Esbriet (loss of appetite) than have hoarseness.  The plan is to continue to hold Ofev for a few more weeks and see if any changes to these new symptoms. Patient's son does note that pt has some memory issues and may misremember or mis-associate - only patient is convinced that Ofev is causing these new side effects. Patient also had changes to CPAP settings that same day she started Ofev.   Rory and his sister are managing patient's medications.  She has OV on 04/19/22 with Dr. [name]. If plan is to continue Esbriet, willl just need new rx sent to [facility] (will have to retitrate patient). Can also challenge Ofev at lower dose of 100mg  twice dialy  Knox Saliva, PharmD, MPH, BCPS, CPP Clinical Pharmacist (Rheumatology and Pulmonology)

## 2022-04-01 ENCOUNTER — Other Ambulatory Visit: Payer: Self-pay

## 2022-04-01 DIAGNOSIS — I5031 Acute diastolic (congestive) heart failure: Secondary | ICD-10-CM

## 2022-04-01 MED ORDER — METOPROLOL SUCCINATE ER 25 MG PO TB24: 25.0000 mg | ORAL_TABLET | Freq: Every day | ORAL | 1 refills | Status: DC

## 2022-04-01 MED ORDER — FUROSEMIDE 20 MG PO TABS: 20.0000 mg | ORAL_TABLET | Freq: Every day | ORAL | 3 refills | Status: DC

## 2022-04-01 MED ORDER — ALBUTEROL SULFATE HFA 108 (90 BASE) MCG/ACT IN AERS: 2.0000 | INHALATION_SPRAY | RESPIRATORY_TRACT | 2 refills | Status: DC | PRN

## 2022-04-01 MED ORDER — LEVOTHYROXINE SODIUM 112 MCG PO TABS: 112.0000 ug | ORAL_TABLET | Freq: Every day | ORAL | 3 refills | Status: DC

## 2022-04-01 NOTE — Telephone Encounter (Signed)
Patient and her son have been called by Dr. [name] and I. Documented in separate telephone encounter.  Plan to hold Ofev for several weeks and discuss re-challenge with ofev vs re-starting Esbriet at f/u visit in ~3 weeks  Knox Saliva, PharmD, MPH, BCPS, CPP Clinical Pharmacist (Rheumatology and Pulmonology)

## 2022-04-02 DIAGNOSIS — M353 Polymyalgia rheumatica: Secondary | ICD-10-CM | POA: Diagnosis not present

## 2022-04-02 DIAGNOSIS — I480 Paroxysmal atrial fibrillation: Secondary | ICD-10-CM | POA: Diagnosis not present

## 2022-04-02 DIAGNOSIS — C50911 Malignant neoplasm of unspecified site of right female breast: Secondary | ICD-10-CM | POA: Diagnosis not present

## 2022-04-02 DIAGNOSIS — Z8679 Personal history of other diseases of the circulatory system: Secondary | ICD-10-CM | POA: Diagnosis not present

## 2022-04-02 DIAGNOSIS — I4891 Unspecified atrial fibrillation: Secondary | ICD-10-CM | POA: Diagnosis not present

## 2022-04-02 DIAGNOSIS — J8410 Pulmonary fibrosis, unspecified: Secondary | ICD-10-CM | POA: Diagnosis not present

## 2022-04-05 ENCOUNTER — Other Ambulatory Visit: Payer: Self-pay

## 2022-04-05 DIAGNOSIS — I5032 Chronic diastolic (congestive) heart failure: Secondary | ICD-10-CM

## 2022-04-05 DIAGNOSIS — R0609 Other forms of dyspnea: Secondary | ICD-10-CM

## 2022-04-05 DIAGNOSIS — I5031 Acute diastolic (congestive) heart failure: Secondary | ICD-10-CM

## 2022-04-05 DIAGNOSIS — E782 Mixed hyperlipidemia: Secondary | ICD-10-CM

## 2022-04-05 DIAGNOSIS — Z7901 Long term (current) use of anticoagulants: Secondary | ICD-10-CM

## 2022-04-07 ENCOUNTER — Telehealth: Payer: Self-pay

## 2022-04-07 DIAGNOSIS — Z7901 Long term (current) use of anticoagulants: Secondary | ICD-10-CM | POA: Diagnosis not present

## 2022-04-07 DIAGNOSIS — R531 Weakness: Secondary | ICD-10-CM | POA: Diagnosis not present

## 2022-04-07 DIAGNOSIS — R2681 Unsteadiness on feet: Secondary | ICD-10-CM | POA: Diagnosis not present

## 2022-04-07 DIAGNOSIS — I5032 Chronic diastolic (congestive) heart failure: Secondary | ICD-10-CM | POA: Diagnosis not present

## 2022-04-07 DIAGNOSIS — R2689 Other abnormalities of gait and mobility: Secondary | ICD-10-CM | POA: Diagnosis not present

## 2022-04-07 NOTE — Telephone Encounter (Signed)
Contact patient's son Christy Sartorius (per patient instruction at last visit) to confirm fill dates of medications.  Fill dates and day supply on hand were confirmed and enrollment in [facility] med sync, packaging and delivery was completed.  Thank you, Maren Reamer Clinical Pharmacist [phone]

## 2022-04-08 LAB — HEMOGLOBIN AND HEMATOCRIT, BLOOD
Hematocrit: 42.5 % (ref 34.0–46.6)
Hemoglobin: 14.2 g/dL (ref 11.1–15.9)

## 2022-04-08 LAB — BASIC METABOLIC PANEL
BUN/Creatinine Ratio: 21 (ref 12–28)
BUN: 21 mg/dL (ref 8–27)
CO2: 25 mmol/L (ref 20–29)
Calcium: 10.2 mg/dL (ref 8.7–10.3)
Chloride: 101 mmol/L (ref 96–106)
Creatinine, Ser: 0.99 mg/dL (ref 0.57–1.00)
Glucose: 82 mg/dL (ref 70–99)
Potassium: 4.5 mmol/L (ref 3.5–5.2)
Sodium: 143 mmol/L (ref 134–144)
eGFR: 56 mL/min/{1.73_m2} — ABNORMAL LOW (ref >59)

## 2022-04-08 LAB — MAGNESIUM: Magnesium: 1.9 mg/dL (ref 1.6–2.3)

## 2022-04-08 LAB — PRO B NATRIURETIC PEPTIDE: NT-Pro BNP: 2529 pg/mL — ABNORMAL HIGH (ref 0–738)

## 2022-04-12 ENCOUNTER — Encounter: Payer: Self-pay | Admitting: Cardiology

## 2022-04-12 ENCOUNTER — Ambulatory Visit: Payer: Medicare HMO | Admitting: Cardiology

## 2022-04-12 VITALS — BP 127/81 | HR 73 | Resp 19 | Ht 66.0 in | Wt 184.0 lb

## 2022-04-12 DIAGNOSIS — I4819 Other persistent atrial fibrillation: Secondary | ICD-10-CM

## 2022-04-12 DIAGNOSIS — I495 Sick sinus syndrome: Secondary | ICD-10-CM | POA: Diagnosis not present

## 2022-04-12 DIAGNOSIS — I700 Atherosclerosis of aorta: Secondary | ICD-10-CM | POA: Diagnosis not present

## 2022-04-12 DIAGNOSIS — E782 Mixed hyperlipidemia: Secondary | ICD-10-CM

## 2022-04-12 DIAGNOSIS — I10 Essential (primary) hypertension: Secondary | ICD-10-CM

## 2022-04-12 DIAGNOSIS — J8410 Pulmonary fibrosis, unspecified: Secondary | ICD-10-CM | POA: Diagnosis not present

## 2022-04-12 DIAGNOSIS — Z7901 Long term (current) use of anticoagulants: Secondary | ICD-10-CM

## 2022-04-12 DIAGNOSIS — G4730 Sleep apnea, unspecified: Secondary | ICD-10-CM | POA: Diagnosis not present

## 2022-04-12 DIAGNOSIS — I5032 Chronic diastolic (congestive) heart failure: Secondary | ICD-10-CM | POA: Diagnosis not present

## 2022-04-12 NOTE — Progress Notes (Signed)
ID:  Rachael [name], DOB 1933/02/13, MRN [id number]  PCP:  Nelwyn Salisbury, MD  Cardiologist:  Tessa Lerner, DO, [facility] (established care 04/09/2021) Former Cardiology Providers: Dr. Lynnette Caffey and Dr. Ladona Ridgel Pulmonary Provider: Dr. Chilton Greathouse.   Date: 04/12/22 Last Office Visit: 03/11/2022  Chief Complaint  Patient presents with   Persistent atrial fibrillation [facility])   Atrial Fibrillation   Follow-up    4 weeks    HPI  MAHINA [name] is a 89 y.o. Caucasian female whose past medical history and cardiovascular risk factors include: pulmonary fibrosis likely idiopathic and possible UIP pattern, Sleep Apnea w/ nocturnal desaturation, hypertension,, aortic atherosclerosis, hypothyroidism, hyperlipidemia, HFpEF, sinus node dysfunction, hx of breast cancer, advanced age, postmenopausal female.  Initially referred to the practice for bradycardia/SVT.  Prior to establishing care she has already been evaluated by Dr. Ladona Ridgel and there was concerns for tachybradycardia syndrome and sinus node dysfunction which will eventually require pacemaker implant but clinically not needed at that time.  She is being treated for HFpEF and did undergo ischemic workup but was later lost to follow-up.  In the interim she was diagnosed with pulmonary fibrosis suggestive of UIP pattern, sleep apnea, as well as nocturnal hypoxia.  She presented back to the office for management of atrial fibrillation which was diagnosed in February 2024.  She was placed on metoprolol for rate control strategy and Eliquis for thromboembolic prophylaxis.  At the last office visit her medications with regards to HFpEF were uptitrated and she was also enrolled into ambulatory blood pressure and weight monitoring.  Data reviewed with her and her son at today's office visit.  She is also seek a second opinion at [facility] cardiology for management of atrial fibrillation.  Patient states that she will continue to follow with [place].  Clinically  denies anginal chest pain or heart failure symptoms.  She is accompanied by her son [name] at today's visit   FUNCTIONAL STATUS: Ambulates with a walker.   ALLERGIES: Allergies  Allergen Reactions   Shellfish Allergy Anaphylaxis and Hives    Hives all over the body. Hives all over the body   Percocet [Oxycodone-Acetaminophen] Hives   Iodinated Contrast Media Other (See Comments)    PT IS NOT AWARE OF IODINE ALLERGY, PREMEDICATED FOR PRIOR PREMEDS ONLY//A.C. - unknown reaction   Iodine Hives    MEDICATION LIST PRIOR TO VISIT: Current Meds  Medication Sig   albuterol (VENTOLIN HFA) 108 (90 Base) MCG/ACT inhaler Inhale 2 puffs into the lungs every 4 (four) hours as needed for wheezing or shortness of breath.   apixaban (ELIQUIS) 5 MG TABS tablet Take 1 tablet (5 mg total) by mouth 2 (two) times daily.   atorvastatin (LIPITOR) 80 MG tablet Take 0.5 tablets (40 mg total) by mouth daily.   Cholecalciferol (VITAMIN D3 PO) Take 1 capsule by mouth daily.   dapagliflozin propanediol (FARXIGA) 10 MG TABS tablet Take 1 tablet (10 mg total) by mouth daily before breakfast.   levothyroxine (SYNTHROID) 112 MCG tablet Take 1 tablet (112 mcg total) by mouth daily before breakfast.   metoprolol succinate (TOPROL-XL) 25 MG 24 hr tablet Take 1 tablet (25 mg total) by mouth daily.   Nintedanib (OFEV) 150 MG CAPS Take 1 capsule (150 mg total) by mouth 2 (two) times daily.   ondansetron (ZOFRAN-ODT) 4 MG disintegrating tablet Take 1 tablet (4 mg total) by mouth every 8 (eight) hours as needed for nausea or vomiting (for nausea from wegovy or other source).  OVER THE COUNTER MEDICATION Take 1 Dose by mouth daily. Juice Plus Berry Blend   [DISCONTINUED] furosemide (LASIX) 20 MG tablet Take 1 tablet (20 mg total) by mouth daily. Take one tablet once a day     PAST MEDICAL HISTORY: Past Medical History:  Diagnosis Date   Anxiety state 11/14/2007   Qualifier: Diagnosis of  By: Jonny Ruiz MD, Len Blalock    Breast  cancer    R   CAROTID STENOSIS    Chronic venous insufficiency    CONTACT DERMATITIS    DUPUYTREN'S CONTRACTURE    Edema    pt  states had edema of feet and legs has been on lasix per dr. to help   GLUCOSE INTOLERANCE    HLD (hyperlipidemia)    Hypothyroidism    Dr. Cherlyn Roberts in [place] endo    MENOPAUSAL SYNDROME    Obesity    Osteoarthritis    bilat hips, severe   PVD (peripheral vascular disease)    mild carotid 11/05   VITAMIN D DEFICIENCY     PAST SURGICAL HISTORY: Past Surgical History:  Procedure Laterality Date   2D echo  11/29/2003   normal LV size. normal EF    BACK SURGERY  12/06/2016   L3-L5 laminectomies per Dr. Bettey Mare at [facility]   BREAST BIOPSY  05/11/2011   right breast   BREAST LUMPECTOMY     right breast   CHOLECYSTECTOMY     FINGER SURGERY     HERNIA REPAIR     incisional hernia   INGUINAL HERNIA REPAIR     JOINT REPLACEMENT Bilateral    Dr. Despina Hick   normal caronary arteries     per pt. 2003   right rotator cuff surgery     SPINE SURGERY     TONSILLECTOMY     TOTAL HIP ARTHROPLASTY  05/2008 and 10/2008   B hip - alusio    FAMILY HISTORY: The patient family history includes Breast cancer (age of onset: 3) in her daughter; Cancer in her daughter and mother; Clotting disorder in her maternal grandmother; Colon cancer in her cousin; Coronary artery disease in an other family member; Diabetes in her son; Heart disease in her brother and father; Hypothyroidism in an other family member; Leukemia in her cousin; Ovarian cancer in her mother; Pancreatic cancer in her maternal grandfather; Prostate cancer in her cousin, maternal uncle, and paternal uncle; Uterine cancer in her cousin; Uterine cancer (age of onset: 67) in her mother; Varicose Veins in her father.  SOCIAL HISTORY:  The patient  reports that she has never smoked. She has been exposed to tobacco smoke. She has never used smokeless tobacco. She reports that she does not drink  alcohol and does not use drugs.  REVIEW OF SYSTEMS: Review of Systems  Cardiovascular:  Positive for dyspnea on exertion (chronic  and stable) and leg swelling. Negative for chest pain, orthopnea, palpitations and syncope.  Respiratory:  Positive for shortness of breath (chronic and stable).     PHYSICAL EXAM:    04/13/2022    4:01 PM 04/12/2022   10:48 AM 03/30/2022   12:43 PM  Vitals with BMI  Height     Weight 184 lbs 184 lbs   BMI 29.71 29.71   Systolic 96 127 99  Diastolic 58 81 65  Pulse 91 73    Physical Exam  Constitutional: No distress.  Age appropriate, hemodynamically stable, walks w/ walker  Neck: No JVD present.  Cardiovascular: Normal rate, S1  normal, S2 normal, intact distal pulses and normal pulses. An irregularly irregular rhythm present. Exam reveals no gallop, no S3 and no S4.  No murmur heard. Pulmonary/Chest: Effort normal and breath sounds normal. No stridor. She has no wheezes. Rales at the bases  Abdominal: Soft. Bowel sounds are normal. She exhibits no distension. There is no abdominal tenderness.  Musculoskeletal:        General: Edema present.     Cervical back: Neck supple.  Neurological: She is alert and oriented to person, place, and time. She has intact cranial nerves (2-12).  Skin: Skin is warm and moist.   CARDIAC DATABASE: EKG: 03/01/2022: Atrial fibrillation, 100 bpm. 03/08/2022: Atrial fibrillation, 85 bpm, PVC, without underlying injury pattern. 03/11/2022: Atrial fibrillation, 64 bpm. 04/12/2022: Atrial fibrillation, 70 bpm, consider old anteroseptal infarct.   Echocardiogram: 11/24/2020: LVEF 60-65%, no regional wall motion abnormalities, mild LVH, grade 1 diastolic dysfunction, moderately dilated left atrium, trivial MR, trivial AR, aortic root at 39 mm, proximal ascending aorta 40 mm, estimated RAP 3 mmHg, small PFO with left-to-right shunting.   Stress Testing: Lexiscan Nuclear stress test 05/13/2021: Nondiagnostic ECG stress. The  heart rate response was consistent with Lexiscan.  Myocardial perfusion is normal. Normal left ventricle. LV rest volume 109 ml. LV stress volume 128 ml. TID ratio 1.17. Overall LV systolic function is mildly abnormal without regional wall motion abnormalities. Stress LV EF: 44%. Global hypokinesis of the left ventricle. No previous exam available for comparison. Intermediate risk study due to reduced LVEF. Correlate with echocardiogram.   Heart Catheterization: None  Cardiac monitor: November 2022 Patch Wear Time:  3 days and 23 hours    Patient had a min HR of 38 bpm, max HR of 171 bpm, and avg HR of 57 bpm.    Predominant underlying rhythm was Sinus Rhythm.    EVENTS: 43 Supraventricular Tachycardia runs occurred, the run with the fastest interval lasting 7 beats with a max rate of 171 bpm, the longest lasting 1 min 20 secs with an avg rate of 139 bpm.    3 Pauses occurred, the longest lasting 3.1 secs (19 bpm).  3 pauses were noted during the monitoring period. 11/19/2020 at 6:28 AM 3.1-second pause. 11/18/2020 at 4:36 PM 3.1-second pause. 11/18/2020 at 3:14 PM 3-second pause.   Sinus bradycardia burden 38%.     Some episodes of Supraventricular Tachycardia may be possible Atrial Tachycardia with variable block. Isolated SVEs were rare (<1.0%), SVE Couplets were rare (<1.0%), and SVE Triplets were rare (<1.0%). Isolated VEs were rare (<1.0%, 729), VE Couplets were rare (<1.0%, 10), and VE Triplets were rare (<1.0%, 2). Ventricular Trigeminy was present.   No atrial fibrillation or ventricular arrhythmias were detected.  Carotid duplex 02/26/2018: Bilateral ICA stenosis 1-39%, bilateral vertebral antegrade flows normal bilateral subclavian flows.  No significant change compared to study from 2015.  LABORATORY DATA:    Latest Ref Rng & Units 04/07/2022    2:59 PM 03/01/2022   11:26 AM 01/10/2022    9:55 AM  CBC  WBC 4.0 - 10.5 K/uL  8.8  11.6   Hemoglobin 11.1 - 15.9 g/dL 16.1   09.6  04.5   Hematocrit 34.0 - 46.6 % 42.5  38.8  38.7   Platelets 150 - 400 K/uL  321  298        Latest Ref Rng & Units 04/07/2022    2:59 PM 03/01/2022   11:26 AM 01/10/2022    9:55 AM  CMP  Glucose 70 -  99 mg/dL 82  478101  295110   BUN 8 - 27 mg/dL 21  12  16    Creatinine 0.57 - 1.00 mg/dL 6.210.99  3.080.89  6.570.93   Sodium 134 - 144 mmol/L 143  141  141   Potassium 3.5 - 5.2 mmol/L 4.5  4.0  3.9   Chloride 96 - 106 mmol/L 101  103  105   CO2 20 - 29 mmol/L 25  24  27    Calcium 8.7 - 10.3 mg/dL 84.610.2  9.8  9.4   Total Protein 6.5 - 8.1 g/dL   6.8   Total Bilirubin 0.3 - 1.2 mg/dL   1.3   Alkaline Phos 38 - 126 U/L   80   AST 15 - 41 U/L   25   ALT 0 - 44 U/L   17     Lipid Panel  Lab Results  Component Value Date   CHOL 127 11/20/2021   HDL 51.10 11/20/2021   LDLCALC 61 11/20/2021   LDLDIRECT 115.8 11/03/2011   TRIG 76.0 11/20/2021   CHOLHDL 2 11/20/2021    No components found for: "NTPROBNP" Recent Labs    05/12/21 1259 04/07/22 1459  PROBNP 718 2,529*   Recent Labs    11/20/21 0918  TSH 0.40    BMP Recent Labs    06/11/21 0719 11/20/21 0918 01/10/22 0955 03/01/22 1126 04/07/22 1459  NA 139   < > 141 141 143  K 3.9   < > 3.9 4.0 4.5  CL 108   < > 105 103 101  CO2 21*   < > 27 24 25   GLUCOSE 158*   < > 110* 101* 82  BUN 16   < > 16 12 21   CREATININE 1.01*   < > 0.93 0.89 0.99  CALCIUM 9.8   < > 9.4 9.8 10.2  GFRNONAA 55*  --  60* >60  --    < > = values in this interval not displayed.    HEMOGLOBIN A1C Lab Results  Component Value Date   HGBA1C 5.9 11/20/2021   MPG 100 02/25/2018    IMPRESSION:    ICD-10-CM   1. Persistent atrial fibrillation  I48.19 EKG 12-Lead    CBC    Basic metabolic panel    Pro b natriuretic peptide (BNP)    CBC    Basic metabolic panel    Pro b natriuretic peptide (BNP)    2. Long term (current) use of anticoagulants  Z79.01     3. Chronic heart failure with preserved ejection fraction (HFpEF)  I50.32 CBC    Basic  metabolic panel    Pro b natriuretic peptide (BNP)    CBC    Basic metabolic panel    Pro b natriuretic peptide (BNP)    4. Essential hypertension  I10     5. Pulmonary fibrosis  J84.10     6. Sleep apnea in adult  G47.30     7. Atherosclerosis of aorta  I70.0     8. Mixed hyperlipidemia  E78.2     9. Sinus node dysfunction  I49.5        RECOMMENDATIONS: Guy SandiferCarol A Thain is a 89 y.o. Caucasian female whose past medical history and cardiac risk factors include: pulmonary fibrosis likely idiopathic and possible UIP pattern, Sleep Apnea w/ nocturnal desaturation, hypertension,, aortic atherosclerosis, hypothyroidism, hyperlipidemia, HFpEF, sinus node dysfunction, hx of breast cancer, advanced age, postmenopausal female.  Persistent atrial fibrillation (HCC) Rate control: Metoprolol. Rhythm control:  NA Thromboembolic prophylaxis: Eliquis No prior history of gastrointestinal or intracranial bleed. Discontinue flecainide. CHA2DS2-VASc SCORE is 6 which correlates to 9.7% risk of stroke per year. We discussed current medical therapy versus restoring normal rhythm.  Rhythm management with antiarrhythmic is limited. Since her atrial fibrillation is newly discovered restoring normal sinus rhythm for long-term management in the setting of heart failure may be beneficial.  We discussed the role of transesophageal guided direct-current cardioversion with both the patient and son.  After careful review of history and examination, the risks, benefits of transesophageal echocardiogram, and alternatives have been explained to the patient and son. Complications include but not limited to esophageal perforation (rare), gastrointestinal bleeding (rare), cardiac arrhythmia which can include cardiac arrest and death (rare), pharyngeal irritation / discomfort with swallowing / hematoma, methemoglobinemia, bronchospasm, transient hypoxia, nonsustained ventricular tachycardia, transient atrial fibrillation,  minimal hemoptysis, vomiting, hypotension, respiratory compromise, reaction to medications, unavoidable damage to teeth and gums, aspiration pneumonia  were reviewed with the patient.  Patient voices understands, provides verbal feedback, questions answered, and patient and son wishes to proceed with the procedure.  Risks, benefits, and alternatives of direct current cardioversion reviewed with the and patient and son. Risk includes but not limited to: potential for post-cardioversion rhythms, life-threatening arrhythmias (ventricular tachycardia and fibrillation, profound bradycardia, cardiac arrest), myocardial damage, acute pulmonary edema, skin burns, transient hypotension. Benefits include restoration of sinus rhythm. Alternatives to treatment were discussed, questions were answered, patient voices understanding and provides verbal feedback.  Patient and son are willing to proceed.   Long term (current) use of anticoagulants Indication: Persistent atrial fibrillation. Risks, benefits, and alternatives to anticoagulation discussed with both patient and daughter at today's office visit.  Chronic heart failure with preserved ejection fraction (HFpEF) (HCC) Stage B, NYHA class II/III Has lost 10 pounds since March 2024 No recent hospitalizations for congestive heart failure. Continue Toprol-XL, [place]. Based on repeat labs will initiate Entresto. Uses Lasix on as needed basis.  Essential hypertension Office blood pressures are well-controlled. Monitor for now  Pulmonary fibrosis [facility]) Currently follows up with Dr. Isaiah Serge Is doing well on the current pharmacological therapy.  Sleep apnea in adult Reemphasized importance of device compliance.  FINAL MEDICATION LIST END OF ENCOUNTER: No orders of the defined types were placed in this encounter.   Medications Discontinued During This Encounter  Medication Reason   Cyanocobalamin (B-12 PO)      Current Outpatient Medications:     albuterol (VENTOLIN HFA) 108 (90 Base) MCG/ACT inhaler, Inhale 2 puffs into the lungs every 4 (four) hours as needed for wheezing or shortness of breath., Disp: 18 g, Rfl: 2   apixaban (ELIQUIS) 5 MG TABS tablet, Take 1 tablet (5 mg total) by mouth 2 (two) times daily., Disp: 180 tablet, Rfl: 1   atorvastatin (LIPITOR) 80 MG tablet, Take 0.5 tablets (40 mg total) by mouth daily., Disp: 90 tablet, Rfl: 1   Cholecalciferol (VITAMIN D3 PO), Take 1 capsule by mouth daily., Disp: , Rfl:    dapagliflozin propanediol (FARXIGA) 10 MG TABS tablet, Take 1 tablet (10 mg total) by mouth daily before breakfast., Disp: 90 tablet, Rfl: 0   levothyroxine (SYNTHROID) 112 MCG tablet, Take 1 tablet (112 mcg total) by mouth daily before breakfast., Disp: 90 tablet, Rfl: 3   metoprolol succinate (TOPROL-XL) 25 MG 24 hr tablet, Take 1 tablet (25 mg total) by mouth daily., Disp: 90 tablet, Rfl: 1   Nintedanib (OFEV) 150 MG CAPS, Take 1 capsule (150 mg total) by mouth  2 (two) times daily., Disp: 180 capsule, Rfl: 1   ondansetron (ZOFRAN-ODT) 4 MG disintegrating tablet, Take 1 tablet (4 mg total) by mouth every 8 (eight) hours as needed for nausea or vomiting (for nausea from wegovy or other source)., Disp: 30 tablet, Rfl: 0   OVER THE COUNTER MEDICATION, Take 1 Dose by mouth daily. Juice Plus [employer], Disp: , Rfl:    allopurinol (ZYLOPRIM) 100 MG tablet, Take 1 tablet (100 mg total) by mouth daily., Disp: 30 tablet, Rfl: 2   furosemide (LASIX) 20 MG tablet, Take 1 tablet (20 mg total) by mouth daily as needed for fluid. Take one tablet once a day, Disp: 90 tablet, Rfl: 3   solifenacin (VESICARE) 10 MG tablet, Take 1 tablet (10 mg total) by mouth daily., Disp: 30 tablet, Rfl: 2  Orders Placed This Encounter  Procedures   CBC   Basic metabolic panel   Pro b natriuretic peptide (BNP)   EKG 12-Lead   There are no Patient Instructions on file for this visit.   --Continue cardiac medications as reconciled in final  medication list. --Return in about 29 days (around 05/11/2022) for Follow up, A. fib, s/.p cardioversion. Or sooner if needed. --Continue follow-up with your primary care physician regarding the management of your other chronic comorbid conditions.  Patient's questions and concerns were addressed to her satisfaction. She voices understanding of the instructions provided during this encounter.   This note was created using a voice recognition software as a result there may be grammatical errors inadvertently enclosed that do not reflect the nature of this encounter. Every attempt is made to correct such errors.  Tessa Lerner, [place], [facility]  Pager:  [phone] Office: [phone]

## 2022-04-12 NOTE — H&P (View-Only) (Signed)
ID:  Rachael [name], DOB 1933/02/13, MRN [id number]  PCP:  Nelwyn Salisbury, MD  Cardiologist:  Tessa Lerner, DO, [facility] (established care 04/09/2021) Former Cardiology Providers: Dr. Lynnette Caffey and Dr. Ladona Ridgel Pulmonary Provider: Dr. Chilton Greathouse.   Date: 04/12/22 Last Office Visit: 03/11/2022  Chief Complaint  Patient presents with   Persistent atrial fibrillation [facility])   Atrial Fibrillation   Follow-up    4 weeks    HPI  Rachael [name] is a 89 y.o. Caucasian female whose past medical history and cardiovascular risk factors include: pulmonary fibrosis likely idiopathic and possible UIP pattern, Sleep Apnea w/ nocturnal desaturation, hypertension,, aortic atherosclerosis, hypothyroidism, hyperlipidemia, HFpEF, sinus node dysfunction, hx of breast cancer, advanced age, postmenopausal female.  Initially referred to the practice for bradycardia/SVT.  Prior to establishing care she has already been evaluated by Dr. Ladona Ridgel and there was concerns for tachybradycardia syndrome and sinus node dysfunction which will eventually require pacemaker implant but clinically not needed at that time.  She is being treated for HFpEF and did undergo ischemic workup but was later lost to follow-up.  In the interim she was diagnosed with pulmonary fibrosis suggestive of UIP pattern, sleep apnea, as well as nocturnal hypoxia.  She presented back to the office for management of atrial fibrillation which was diagnosed in February 2024.  She was placed on metoprolol for rate control strategy and Eliquis for thromboembolic prophylaxis.  At the last office visit her medications with regards to HFpEF were uptitrated and she was also enrolled into ambulatory blood pressure and weight monitoring.  Data reviewed with her and her son at today's office visit.  She is also seek a second opinion at [facility] cardiology for management of atrial fibrillation.  Patient states that she will continue to follow with [place].  Clinically  denies anginal chest pain or heart failure symptoms.  She is accompanied by her son [name] at today's visit   FUNCTIONAL STATUS: Ambulates with a walker.   ALLERGIES: Allergies  Allergen Reactions   Shellfish Allergy Anaphylaxis and Hives    Hives all over the body. Hives all over the body   Percocet [Oxycodone-Acetaminophen] Hives   Iodinated Contrast Media Other (See Comments)    PT IS NOT AWARE OF IODINE ALLERGY, PREMEDICATED FOR PRIOR PREMEDS ONLY//A.C. - unknown reaction   Iodine Hives    MEDICATION LIST PRIOR TO VISIT: Current Meds  Medication Sig   albuterol (VENTOLIN HFA) 108 (90 Base) MCG/ACT inhaler Inhale 2 puffs into the lungs every 4 (four) hours as needed for wheezing or shortness of breath.   apixaban (ELIQUIS) 5 MG TABS tablet Take 1 tablet (5 mg total) by mouth 2 (two) times daily.   atorvastatin (LIPITOR) 80 MG tablet Take 0.5 tablets (40 mg total) by mouth daily.   Cholecalciferol (VITAMIN D3 PO) Take 1 capsule by mouth daily.   dapagliflozin propanediol (FARXIGA) 10 MG TABS tablet Take 1 tablet (10 mg total) by mouth daily before breakfast.   levothyroxine (SYNTHROID) 112 MCG tablet Take 1 tablet (112 mcg total) by mouth daily before breakfast.   metoprolol succinate (TOPROL-XL) 25 MG 24 hr tablet Take 1 tablet (25 mg total) by mouth daily.   Nintedanib (OFEV) 150 MG CAPS Take 1 capsule (150 mg total) by mouth 2 (two) times daily.   ondansetron (ZOFRAN-ODT) 4 MG disintegrating tablet Take 1 tablet (4 mg total) by mouth every 8 (eight) hours as needed for nausea or vomiting (for nausea from wegovy or other source).  OVER THE COUNTER MEDICATION Take 1 Dose by mouth daily. Juice Plus Berry Blend   [DISCONTINUED] furosemide (LASIX) 20 MG tablet Take 1 tablet (20 mg total) by mouth daily. Take one tablet once a day     PAST MEDICAL HISTORY: Past Medical History:  Diagnosis Date   Anxiety state 11/14/2007   Qualifier: Diagnosis of  By: Jonny Ruiz MD, Len Blalock    Breast  cancer    R   CAROTID STENOSIS    Chronic venous insufficiency    CONTACT DERMATITIS    DUPUYTREN'S CONTRACTURE    Edema    pt  states had edema of feet and legs has been on lasix per dr. to help   GLUCOSE INTOLERANCE    HLD (hyperlipidemia)    Hypothyroidism    Dr. Cherlyn Roberts in [place] endo    MENOPAUSAL SYNDROME    Obesity    Osteoarthritis    bilat hips, severe   PVD (peripheral vascular disease)    mild carotid 11/05   VITAMIN D DEFICIENCY     PAST SURGICAL HISTORY: Past Surgical History:  Procedure Laterality Date   2D echo  11/29/2003   normal LV size. normal EF    BACK SURGERY  12/06/2016   L3-L5 laminectomies per Dr. Bettey Mare at [facility]   BREAST BIOPSY  05/11/2011   right breast   BREAST LUMPECTOMY     right breast   CHOLECYSTECTOMY     FINGER SURGERY     HERNIA REPAIR     incisional hernia   INGUINAL HERNIA REPAIR     JOINT REPLACEMENT Bilateral    Dr. Despina Hick   normal caronary arteries     per pt. 2003   right rotator cuff surgery     SPINE SURGERY     TONSILLECTOMY     TOTAL HIP ARTHROPLASTY  05/2008 and 10/2008   B hip - alusio    FAMILY HISTORY: The patient family history includes Breast cancer (age of onset: 3) in her daughter; Cancer in her daughter and mother; Clotting disorder in her maternal grandmother; Colon cancer in her cousin; Coronary artery disease in an other family member; Diabetes in her son; Heart disease in her brother and father; Hypothyroidism in an other family member; Leukemia in her cousin; Ovarian cancer in her mother; Pancreatic cancer in her maternal grandfather; Prostate cancer in her cousin, maternal uncle, and paternal uncle; Uterine cancer in her cousin; Uterine cancer (age of onset: 67) in her mother; Varicose Veins in her father.  SOCIAL HISTORY:  The patient  reports that she has never smoked. She has been exposed to tobacco smoke. She has never used smokeless tobacco. She reports that she does not drink  alcohol and does not use drugs.  REVIEW OF SYSTEMS: Review of Systems  Cardiovascular:  Positive for dyspnea on exertion (chronic  and stable) and leg swelling. Negative for chest pain, orthopnea, palpitations and syncope.  Respiratory:  Positive for shortness of breath (chronic and stable).     PHYSICAL EXAM:    04/13/2022    4:01 PM 04/12/2022   10:48 AM 03/30/2022   12:43 PM  Vitals with BMI  Height     Weight 184 lbs 184 lbs   BMI 29.71 29.71   Systolic 96 127 99  Diastolic 58 81 65  Pulse 91 73    Physical Exam  Constitutional: No distress.  Age appropriate, hemodynamically stable, walks w/ walker  Neck: No JVD present.  Cardiovascular: Normal rate, S1  normal, S2 normal, intact distal pulses and normal pulses. An irregularly irregular rhythm present. Exam reveals no gallop, no S3 and no S4.  No murmur heard. Pulmonary/Chest: Effort normal and breath sounds normal. No stridor. She has no wheezes. Rales at the bases  Abdominal: Soft. Bowel sounds are normal. She exhibits no distension. There is no abdominal tenderness.  Musculoskeletal:        General: Edema present.     Cervical back: Neck supple.  Neurological: She is alert and oriented to person, place, and time. She has intact cranial nerves (2-12).  Skin: Skin is warm and moist.   CARDIAC DATABASE: EKG: 03/01/2022: Atrial fibrillation, 100 bpm. 03/08/2022: Atrial fibrillation, 85 bpm, PVC, without underlying injury pattern. 03/11/2022: Atrial fibrillation, 64 bpm. 04/12/2022: Atrial fibrillation, 70 bpm, consider old anteroseptal infarct.   Echocardiogram: 11/24/2020: LVEF 60-65%, no regional wall motion abnormalities, mild LVH, grade 1 diastolic dysfunction, moderately dilated left atrium, trivial MR, trivial AR, aortic root at 39 mm, proximal ascending aorta 40 mm, estimated RAP 3 mmHg, small PFO with left-to-right shunting.   Stress Testing: Lexiscan Nuclear stress test 05/13/2021: Nondiagnostic ECG stress. The  heart rate response was consistent with Lexiscan.  Myocardial perfusion is normal. Normal left ventricle. LV rest volume 109 ml. LV stress volume 128 ml. TID ratio 1.17. Overall LV systolic function is mildly abnormal without regional wall motion abnormalities. Stress LV EF: 44%. Global hypokinesis of the left ventricle. No previous exam available for comparison. Intermediate risk study due to reduced LVEF. Correlate with echocardiogram.   Heart Catheterization: None  Cardiac monitor: November 2022 Patch Wear Time:  3 days and 23 hours    Patient had a min HR of 38 bpm, max HR of 171 bpm, and avg HR of 57 bpm.    Predominant underlying rhythm was Sinus Rhythm.    EVENTS: 43 Supraventricular Tachycardia runs occurred, the run with the fastest interval lasting 7 beats with a max rate of 171 bpm, the longest lasting 1 min 20 secs with an avg rate of 139 bpm.    3 Pauses occurred, the longest lasting 3.1 secs (19 bpm).  3 pauses were noted during the monitoring period. 11/19/2020 at 6:28 AM 3.1-second pause. 11/18/2020 at 4:36 PM 3.1-second pause. 11/18/2020 at 3:14 PM 3-second pause.   Sinus bradycardia burden 38%.     Some episodes of Supraventricular Tachycardia may be possible Atrial Tachycardia with variable block. Isolated SVEs were rare (<1.0%), SVE Couplets were rare (<1.0%), and SVE Triplets were rare (<1.0%). Isolated VEs were rare (<1.0%, 729), VE Couplets were rare (<1.0%, 10), and VE Triplets were rare (<1.0%, 2). Ventricular Trigeminy was present.   No atrial fibrillation or ventricular arrhythmias were detected.  Carotid duplex 02/26/2018: Bilateral ICA stenosis 1-39%, bilateral vertebral antegrade flows normal bilateral subclavian flows.  No significant change compared to study from 2015.  LABORATORY DATA:    Latest Ref Rng & Units 04/07/2022    2:59 PM 03/01/2022   11:26 AM 01/10/2022    9:55 AM  CBC  WBC 4.0 - 10.5 K/uL  8.8  11.6   Hemoglobin 11.1 - 15.9 g/dL 16.1   09.6  04.5   Hematocrit 34.0 - 46.6 % 42.5  38.8  38.7   Platelets 150 - 400 K/uL  321  298        Latest Ref Rng & Units 04/07/2022    2:59 PM 03/01/2022   11:26 AM 01/10/2022    9:55 AM  CMP  Glucose 70 -  99 mg/dL 82  161  096   BUN 8 - 27 mg/dL Creatinine 0.57 - 1.00 mg/dL 0.45  4.09  8.11   Sodium 134 - 144 mmol/L 143  141  141   Potassium 3.5 - 5.2 mmol/L 4.5  4.0  3.9   Chloride 96 - 106 mmol/L 101  103  105   CO2 20 - 29 mmol/L Calcium 8.7 - 10.3 mg/dL 91.4  9.8  9.4   Total Protein 6.5 - 8.1 g/dL   6.8   Total Bilirubin 0.3 - 1.2 mg/dL   1.3   Alkaline Phos 38 - 126 U/L   80   AST 15 - 41 U/L   25   ALT 0 - 44 U/L   17     Lipid Panel  Lab Results  Component Value Date   CHOL 127 11/20/2021   HDL 51.10 11/20/2021   LDLCALC 61 11/20/2021   LDLDIRECT 115.8 11/03/2011   TRIG 76.0 11/20/2021   CHOLHDL 2 11/20/2021    No components found for: "NTPROBNP" Recent Labs    05/12/21 1259 04/07/22 1459  PROBNP 718 2,529*   Recent Labs    11/20/21 0918  TSH 0.40    BMP Recent Labs    06/11/21 0719 11/20/21 0918 01/10/22 0955 03/01/22 1126 04/07/22 1459  NA 139   < > 141 141 143  K 3.9   < > 3.9 4.0 4.5  CL 108   < > 105 103 101  CO2 21*   < > GLUCOSE 158*   < > 110* 101* 82  BUN 16   < > CREATININE 1.01*   < > 0.93 0.89 0.99  CALCIUM 9.8   < > 9.4 9.8 10.2  GFRNONAA 55*  --  60* >60  --    < > = values in this interval not displayed.    HEMOGLOBIN A1C Lab Results  Component Value Date   HGBA1C 5.9 11/20/2021   MPG 100 02/25/2018    IMPRESSION:    ICD-10-CM   1. Persistent atrial fibrillation  I48.19 EKG 12-Lead    CBC    Basic metabolic panel    Pro b natriuretic peptide (BNP)    CBC    Basic metabolic panel    Pro b natriuretic peptide (BNP)    2. Long term (current) use of anticoagulants  Z79.01     3. Chronic heart failure with preserved ejection fraction (HFpEF)  I50.32 CBC    Basic  metabolic panel    Pro b natriuretic peptide (BNP)    CBC    Basic metabolic panel    Pro b natriuretic peptide (BNP)    4. Essential hypertension  I10     5. Pulmonary fibrosis  J84.10     6. Sleep apnea in adult  G47.30     7. Atherosclerosis of aorta  I70.0     8. Mixed hyperlipidemia  E78.2     9. Sinus node dysfunction  I49.5        RECOMMENDATIONS: Rachael [name] is a 89 y.o. Caucasian female whose past medical history and cardiac risk factors include: pulmonary fibrosis likely idiopathic and possible UIP pattern, Sleep Apnea w/ nocturnal desaturation, hypertension,, aortic atherosclerosis, hypothyroidism, hyperlipidemia, HFpEF, sinus node dysfunction, hx of breast cancer, advanced age, postmenopausal female.  Persistent atrial fibrillation (HCC) Rate control: Metoprolol. Rhythm control:  NA Thromboembolic prophylaxis: Eliquis No prior history of gastrointestinal or intracranial bleed. Discontinue flecainide. CHA2DS2-VASc SCORE is 6 which correlates to 9.7% risk of stroke per year. We discussed current medical therapy versus restoring normal rhythm.  Rhythm management with antiarrhythmic is limited. Since her atrial fibrillation is newly discovered restoring normal sinus rhythm for long-term management in the setting of heart failure may be beneficial.  We discussed the role of transesophageal guided direct-current cardioversion with both the patient and son.  After careful review of history and examination, the risks, benefits of transesophageal echocardiogram, and alternatives have been explained to the patient and son. Complications include but not limited to esophageal perforation (rare), gastrointestinal bleeding (rare), cardiac arrhythmia which can include cardiac arrest and death (rare), pharyngeal irritation / discomfort with swallowing / hematoma, methemoglobinemia, bronchospasm, transient hypoxia, nonsustained ventricular tachycardia, transient atrial fibrillation,  minimal hemoptysis, vomiting, hypotension, respiratory compromise, reaction to medications, unavoidable damage to teeth and gums, aspiration pneumonia  were reviewed with the patient.  Patient voices understands, provides verbal feedback, questions answered, and patient and son wishes to proceed with the procedure.  Risks, benefits, and alternatives of direct current cardioversion reviewed with the and patient and son. Risk includes but not limited to: potential for post-cardioversion rhythms, life-threatening arrhythmias (ventricular tachycardia and fibrillation, profound bradycardia, cardiac arrest), myocardial damage, acute pulmonary edema, skin burns, transient hypotension. Benefits include restoration of sinus rhythm. Alternatives to treatment were discussed, questions were answered, patient voices understanding and provides verbal feedback.  Patient and son are willing to proceed.   Long term (current) use of anticoagulants Indication: Persistent atrial fibrillation. Risks, benefits, and alternatives to anticoagulation discussed with both patient and daughter at today's office visit.  Chronic heart failure with preserved ejection fraction (HFpEF) (HCC) Stage B, NYHA class II/III Has lost 10 pounds since March 2024 No recent hospitalizations for congestive heart failure. Continue Toprol-XL, [place]. Based on repeat labs will initiate Entresto. Uses Lasix on as needed basis.  Essential hypertension Office blood pressures are well-controlled. Monitor for now  Pulmonary fibrosis [facility]) Currently follows up with Dr. Isaiah Serge Is doing well on the current pharmacological therapy.  Sleep apnea in adult Reemphasized importance of device compliance.  FINAL MEDICATION LIST END OF ENCOUNTER: No orders of the defined types were placed in this encounter.   Medications Discontinued During This Encounter  Medication Reason   Cyanocobalamin (B-12 PO)      Current Outpatient Medications:     albuterol (VENTOLIN HFA) 108 (90 Base) MCG/ACT inhaler, Inhale 2 puffs into the lungs every 4 (four) hours as needed for wheezing or shortness of breath., Disp: 18 g, Rfl: 2   apixaban (ELIQUIS) 5 MG TABS tablet, Take 1 tablet (5 mg total) by mouth 2 (two) times daily., Disp: 180 tablet, Rfl: 1   atorvastatin (LIPITOR) 80 MG tablet, Take 0.5 tablets (40 mg total) by mouth daily., Disp: 90 tablet, Rfl: 1   Cholecalciferol (VITAMIN D3 PO), Take 1 capsule by mouth daily., Disp: , Rfl:    dapagliflozin propanediol (FARXIGA) 10 MG TABS tablet, Take 1 tablet (10 mg total) by mouth daily before breakfast., Disp: 90 tablet, Rfl: 0   levothyroxine (SYNTHROID) 112 MCG tablet, Take 1 tablet (112 mcg total) by mouth daily before breakfast., Disp: 90 tablet, Rfl: 3   metoprolol succinate (TOPROL-XL) 25 MG 24 hr tablet, Take 1 tablet (25 mg total) by mouth daily., Disp: 90 tablet, Rfl: 1   Nintedanib (OFEV) 150 MG CAPS, Take 1 capsule (150 mg total) by mouth  2 (two) times daily., Disp: 180 capsule, Rfl: 1   ondansetron (ZOFRAN-ODT) 4 MG disintegrating tablet, Take 1 tablet (4 mg total) by mouth every 8 (eight) hours as needed for nausea or vomiting (for nausea from wegovy or other source)., Disp: 30 tablet, Rfl: 0   OVER THE COUNTER MEDICATION, Take 1 Dose by mouth daily. Juice Plus [employer], Disp: , Rfl:    allopurinol (ZYLOPRIM) 100 MG tablet, Take 1 tablet (100 mg total) by mouth daily., Disp: 30 tablet, Rfl: 2   furosemide (LASIX) 20 MG tablet, Take 1 tablet (20 mg total) by mouth daily as needed for fluid. Take one tablet once a day, Disp: 90 tablet, Rfl: 3   solifenacin (VESICARE) 10 MG tablet, Take 1 tablet (10 mg total) by mouth daily., Disp: 30 tablet, Rfl: 2  Orders Placed This Encounter  Procedures   CBC   Basic metabolic panel   Pro b natriuretic peptide (BNP)   EKG 12-Lead   There are no Patient Instructions on file for this visit.   --Continue cardiac medications as reconciled in final  medication list. --Return in about 29 days (around 05/11/2022) for Follow up, A. fib, s/.p cardioversion. Or sooner if needed. --Continue follow-up with your primary care physician regarding the management of your other chronic comorbid conditions.  Patient's questions and concerns were addressed to her satisfaction. She voices understanding of the instructions provided during this encounter.   This note was created using a voice recognition software as a result there may be grammatical errors inadvertently enclosed that do not reflect the nature of this encounter. Every attempt is made to correct such errors.  Tessa Lerner, [place], [facility]  Pager:  [phone] Office: [phone]

## 2022-04-13 ENCOUNTER — Encounter: Payer: Self-pay | Admitting: Family Medicine

## 2022-04-13 ENCOUNTER — Ambulatory Visit (INDEPENDENT_AMBULATORY_CARE_PROVIDER_SITE_OTHER): Payer: Medicare HMO | Admitting: Family Medicine

## 2022-04-13 VITALS — BP 96/58 | HR 91 | Temp 98.1°F | Wt 184.0 lb

## 2022-04-13 DIAGNOSIS — N3941 Urge incontinence: Secondary | ICD-10-CM | POA: Diagnosis not present

## 2022-04-13 DIAGNOSIS — I509 Heart failure, unspecified: Secondary | ICD-10-CM

## 2022-04-13 DIAGNOSIS — I5031 Acute diastolic (congestive) heart failure: Secondary | ICD-10-CM

## 2022-04-13 DIAGNOSIS — R2681 Unsteadiness on feet: Secondary | ICD-10-CM | POA: Diagnosis not present

## 2022-04-13 DIAGNOSIS — I5032 Chronic diastolic (congestive) heart failure: Secondary | ICD-10-CM

## 2022-04-13 DIAGNOSIS — R2689 Other abnormalities of gait and mobility: Secondary | ICD-10-CM | POA: Diagnosis not present

## 2022-04-13 DIAGNOSIS — M109 Gout, unspecified: Secondary | ICD-10-CM | POA: Diagnosis not present

## 2022-04-13 DIAGNOSIS — R531 Weakness: Secondary | ICD-10-CM | POA: Diagnosis not present

## 2022-04-13 MED ORDER — OXYBUTYNIN CHLORIDE ER 5 MG PO TB24: 5.0000 mg | ORAL_TABLET | Freq: Every day | ORAL | 2 refills | Status: DC

## 2022-04-13 MED ORDER — FUROSEMIDE 20 MG PO TABS: 20.0000 mg | ORAL_TABLET | Freq: Every day | ORAL | 3 refills | Status: DC | PRN

## 2022-04-13 MED ORDER — SOLIFENACIN SUCCINATE 10 MG PO TABS: 10.0000 mg | ORAL_TABLET | Freq: Every day | ORAL | 2 refills | Status: DC

## 2022-04-13 MED ORDER — ALLOPURINOL 100 MG PO TABS: 100.0000 mg | ORAL_TABLET | Freq: Every day | ORAL | 2 refills | Status: DC

## 2022-04-13 NOTE — Progress Notes (Signed)
   Subjective:    Patient ID: [name], female    DOB: 08-May-1933, 89 y.o.   MRN: [id number]  HPI Here with her son for several issues. First she has been taking a Prednisone taper for a gout attack in her right shoulder that started 5 days ago. This is responding nicely. However this is the third gout flare she has had in the past 3 months. They ask if she can take a preventative medication. Also she still struggles with urge incontinence. Last year she saw Urology a few times, and they gave her Oxybutynin and Myrbetriq to try. These were not helpful. She has less swelling in her ankles lately, and she has been able to cut back on her use of Lasix. She now takes it only as needed. Of course taking the Lasix makes her incontinence worse. No SOB. She scheduled for a TEE on [id number].    Review of Systems  Constitutional: Negative.   Respiratory: Negative.    Cardiovascular:  Positive for leg swelling. Negative for chest pain and palpitations.  Genitourinary:  Positive for urgency. Negative for dysuria and hematuria.       Objective:   Physical Exam Constitutional:      Comments: Using her walker   Cardiovascular:     Rate and Rhythm: Normal rate. Rhythm irregular.     Pulses: Normal pulses.     Heart sounds: Normal heart sounds.  Pulmonary:     Effort: Pulmonary effort is normal.     Breath sounds: No wheezing or rhonchi.     Comments: She has crackles at both bases Musculoskeletal:     Comments: 1+ edema in both ankles   Neurological:     Mental Status: She is alert.           Assessment & Plan:  For the CHF, she will use Lasix as needed. She knows to use this is the ankles swelling gets worse, if she feels SOB, or if she gains 3 lbs of weight in 24 hours. For the gout, we will check a uric acid level for a baseline. She will start on Allopurinol 100 mg daily. For the urge incontinence, she will try Vesicare 10 mg daily.  Alysia Penna, MD

## 2022-04-15 ENCOUNTER — Other Ambulatory Visit (HOSPITAL_COMMUNITY): Payer: Self-pay

## 2022-04-15 ENCOUNTER — Other Ambulatory Visit (INDEPENDENT_AMBULATORY_CARE_PROVIDER_SITE_OTHER): Payer: Medicare HMO

## 2022-04-15 DIAGNOSIS — M109 Gout, unspecified: Secondary | ICD-10-CM

## 2022-04-15 LAB — URIC ACID: Uric Acid, Serum: 6.7 mg/dL (ref 2.4–7.0)

## 2022-04-16 ENCOUNTER — Telehealth: Payer: Self-pay

## 2022-04-16 ENCOUNTER — Other Ambulatory Visit (HOSPITAL_COMMUNITY): Payer: Self-pay

## 2022-04-16 NOTE — Telephone Encounter (Signed)
Patient Advocate Encounter   Received notification from [facility] that prior authorization is required for Solifenacin Succinate 10MG  tablets   Submitted: 04-16-2022 Key [id number]  Status is pending

## 2022-04-19 ENCOUNTER — Ambulatory Visit: Payer: Medicare HMO | Admitting: Pulmonary Disease

## 2022-04-19 ENCOUNTER — Encounter: Payer: Self-pay | Admitting: Pulmonary Disease

## 2022-04-19 VITALS — BP 106/60 | HR 72 | Temp 97.8°F | Ht 67.0 in | Wt 195.0 lb

## 2022-04-19 DIAGNOSIS — I480 Paroxysmal atrial fibrillation: Secondary | ICD-10-CM | POA: Diagnosis not present

## 2022-04-19 DIAGNOSIS — G4733 Obstructive sleep apnea (adult) (pediatric): Secondary | ICD-10-CM | POA: Diagnosis not present

## 2022-04-19 DIAGNOSIS — Z5181 Encounter for therapeutic drug level monitoring: Secondary | ICD-10-CM

## 2022-04-19 DIAGNOSIS — J849 Interstitial pulmonary disease, unspecified: Secondary | ICD-10-CM

## 2022-04-19 DIAGNOSIS — R0602 Shortness of breath: Secondary | ICD-10-CM | POA: Diagnosis not present

## 2022-04-19 LAB — COMPREHENSIVE METABOLIC PANEL
ALT: 13 U/L (ref 0–35)
AST: 18 U/L (ref 0–37)
Albumin: 3.9 g/dL (ref 3.5–5.2)
Alkaline Phosphatase: 79 U/L (ref 39–117)
BUN: 24 mg/dL — ABNORMAL HIGH (ref 6–23)
CO2: 29 mEq/L (ref 19–32)
Calcium: 10.2 mg/dL (ref 8.4–10.5)
Chloride: 104 mEq/L (ref 96–112)
Creatinine, Ser: 1.12 mg/dL (ref 0.40–1.20)
GFR: 44.57 mL/min — ABNORMAL LOW (ref >60.00)
Glucose, Bld: 89 mg/dL (ref 70–99)
Potassium: 3.9 mEq/L (ref 3.5–5.1)
Sodium: 142 mEq/L (ref 135–145)
Total Bilirubin: 1.2 mg/dL (ref 0.2–1.2)
Total Protein: 6.4 g/dL (ref 6.0–8.3)

## 2022-04-19 NOTE — Progress Notes (Signed)
Rachael [name]    [id number]    06-07-1933  Primary Care Physician:Fry, Tera Mater, MD  Referring Physician: Nelwyn Salisbury, MD [address] [place],  [place] [address]  Chief complaint:  Follow up for interstitial lung disease  HPI: 89 y.o. with history of hyperlipidemia, hypothyroidism, diastolic CHF, CKD, anxiety disorder Referred for evaluation of abnormal CT chest from June 2023 showing interstitial lung disease She has mild dyspnea on exertion, denies any cough, sputum production, fevers or chills History notable for COVID-19 in December 2021 and was in an hospital for 1 day. Did not require supplemental O2  She had a hospitalization in June 2023 for acute respiratory failure with CTA showing no clot.  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  No feather pillows or comforters ILD questionnaire 07/07/2021-negative Smoking history: Non smoker Travel history: No significant travel Relevant family history: No family history of lung disease  Interim history: Started on Esbriet in September 2023.  She was poorly tolerated with stomach upset, weight loss Ofev started in February 2024.  She initially had some hoarseness with that but not sure if this was related to [facility] as she started using the CPAP on a regular basis at that time as well.  She continues on the Ofev with some improvement in hoarseness of voice.  No other complaints today  Since her last visit she has had a second opinion visit with Dr. Gaynell Face at [facility] in February 2024 who agrees with her recommendation  Recently diagnosed with persistent atrial fibrillation and is due to get TEE and cardioversion by Dr. Odis Hollingshead next week.  She is also had a second opinion from cardiology at  [facility].  Outpatient Encounter Medications as of 04/19/2022  Medication Sig   albuterol (VENTOLIN HFA) 108 (90 Base) MCG/ACT inhaler Inhale 2 puffs into the lungs every 4 (four) hours as needed for wheezing or shortness of breath.   allopurinol (ZYLOPRIM) 100 MG tablet Take 1 tablet (100 mg total) by mouth daily.   apixaban (ELIQUIS) 5 MG TABS tablet Take 1 tablet (5 mg total) by mouth 2 (two) times daily.   atorvastatin (LIPITOR) 80 MG tablet Take 0.5 tablets (40 mg total) by mouth daily.   Cholecalciferol (VITAMIN D3 PO) Take 1 capsule by mouth daily.   dapagliflozin propanediol (FARXIGA) 10 MG TABS tablet Take 1 tablet (10 mg total) by mouth daily before breakfast.   furosemide (LASIX) 20 MG tablet Take 1 tablet (20 mg total) by mouth daily as needed for fluid. Take one tablet once a day   levothyroxine (SYNTHROID) 112 MCG tablet Take 1 tablet (112 mcg total) by mouth daily before breakfast.   metoprolol succinate (TOPROL-XL) 25 MG 24 hr tablet Take 1 tablet (25 mg total) by mouth daily.   Nintedanib (OFEV) 150 MG CAPS Take 1 capsule (150 mg total) by mouth 2 (two) times daily.   ondansetron (ZOFRAN-ODT) 4 MG disintegrating tablet Take 1 tablet (4 mg total) by mouth every 8 (eight) hours as needed for nausea or vomiting (for nausea from wegovy or other source).   OVER THE COUNTER MEDICATION Take 1 Dose by mouth daily. Juice Plus Berry Blend   solifenacin (VESICARE) 10 MG tablet Take 1 tablet (10 mg total)  by mouth daily.   No facility-administered encounter medications on file as of 04/19/2022.   Physical Exam: Blood pressure 106/60, pulse 72, temperature 97.8 F (36.6 C), temperature source Oral, height 5\' 7"  (1.702 m), weight 195 lb (88.5 kg), SpO2 99 %. Gen:      No acute distress HEENT:  EOMI, sclera anicteric Neck:     No masses; no thyromegaly Lungs:    Clear to auscultation bilaterally; normal respiratory effort CV:         Regular rate and rhythm; no murmurs Abd:      + bowel sounds;  soft, non-tender; no palpable masses, no distension Ext:    No edema; adequate peripheral perfusion Skin:      Warm and dry; no rash Neuro: alert and oriented x 3 Psych: normal mood and affect   Data Reviewed: Imaging: CT chest abdomen pelvis 03/18/2015-dependent atelectasis/scarring at the base CTA 06/11/2021-subpleural reticulation, possible honeycombing at the base. High-resolution CT 07/22/2021-basilar predominant fibrotic disease and probable UIP pattern with mild progression since 2017 I reviewed the images personally  PFTs: 06/30/2021 FVC 2.59 [93%], FEV1 2.01 [97%], F/F 77, DLCO 17.27 [84%] Normal lung function test  Labs: CTD serologies 07/01/2021-ANA 1:80, nuclear speckled, rheumatoid factor 17  Sleep: Sleep study 08/12/2021-moderate OSA with AHI 24, desaturation to 81%  Assessment:  Assessment for interstitial lung disease The scan shows mildly progressive pulmonary fibrosis and probable UIP pattern There are no signs and symptoms of connective tissue disease or exposures.  CTD serologies show borderline elevated ANA and rheumatoid factor which are nonspecific.  PFTs are normal  This is likely to be idiopathic pulmonary fibrosis.  I discussed in detail the diagnosis and treatment options with patient and her son.  Esbriet is poorly tolerated and she does not want to go back on the medication We discussed alternate therapies and she wants to try Ofev instead So far she is tolerating Ofev okay except for some hoarseness of voice which would not show is due to antifibrotic medication Check LFTs today  Atrial fibrillation On Eliquis Scheduled for TEE and cardioversion next week.  She should be able to tolerate the procedure as her lung function is relatively preserved  Sleep apnea Sleep study last year as PSG shows moderate OSA with desaturation We discussed need for compliance with CPAP  Plan/Recommendations: Continue Ofev Check hepatic panel, proBNP  Chilton Greathouse  MD [facility] 04/19/2022, 11:02 AM  CC: Nelwyn Salisbury, MD

## 2022-04-19 NOTE — Telephone Encounter (Signed)
Pharmacy Patient Advocate Encounter  Prior Authorization for Solifenacin has been approved by [employer] (ins).     Effective dates: 04/17/2022 through 01/11/2023

## 2022-04-19 NOTE — Patient Instructions (Signed)
I am glad you are tolerating the Ofev well Will check complaints metabolic panel and proBNP for dyspnea today Order follow-up high-resolution CT in 6 months Return to clinic in 6 months after CT scan

## 2022-04-19 NOTE — Telephone Encounter (Signed)
Per review of OV note today, patient will continue Ofev as prescribed. Hoarseness in voice has slightly improved  Chesley Mires, PharmD, MPH, BCPS, CPP Clinical Pharmacist (Rheumatology and Pulmonology)

## 2022-04-20 LAB — PRO B NATRIURETIC PEPTIDE: NT-Pro BNP: 1892 pg/mL — ABNORMAL HIGH (ref 0–738)

## 2022-04-21 DIAGNOSIS — R2681 Unsteadiness on feet: Secondary | ICD-10-CM | POA: Diagnosis not present

## 2022-04-21 DIAGNOSIS — R531 Weakness: Secondary | ICD-10-CM | POA: Diagnosis not present

## 2022-04-21 DIAGNOSIS — R2689 Other abnormalities of gait and mobility: Secondary | ICD-10-CM | POA: Diagnosis not present

## 2022-04-21 NOTE — Telephone Encounter (Signed)
Noted  

## 2022-04-22 ENCOUNTER — Other Ambulatory Visit: Payer: Self-pay

## 2022-04-22 DIAGNOSIS — E785 Hyperlipidemia, unspecified: Secondary | ICD-10-CM

## 2022-04-22 MED ORDER — ATORVASTATIN CALCIUM 80 MG PO TABS
40.0000 mg | ORAL_TABLET | Freq: Every day | ORAL | 1 refills | Status: DC
Start: 2022-04-22 — End: 2022-04-26

## 2022-04-27 DIAGNOSIS — R2681 Unsteadiness on feet: Secondary | ICD-10-CM | POA: Diagnosis not present

## 2022-04-27 DIAGNOSIS — R531 Weakness: Secondary | ICD-10-CM | POA: Diagnosis not present

## 2022-04-27 DIAGNOSIS — R2689 Other abnormalities of gait and mobility: Secondary | ICD-10-CM | POA: Diagnosis not present

## 2022-04-27 NOTE — Pre-Procedure Instructions (Signed)
Instructed patient on following items: Arrival time: 1000 NPO after MN... Bp and anticoagulation meds ok with sips of water AM of procedure Responsible party to stay home after procedure and for 24hrs Have you missed any does of anticoagulant?Pt states she takes eliquis,has not missed any doses. Instructed to take medication as prescribed morning of procedure.

## 2022-04-28 ENCOUNTER — Other Ambulatory Visit: Payer: Self-pay

## 2022-04-28 ENCOUNTER — Ambulatory Visit (HOSPITAL_COMMUNITY)
Admission: RE | Admit: 2022-04-28 | Discharge: 2022-04-28 | Disposition: A | Discharge status: Home / Self Care | Payer: Medicare HMO | Source: Ambulatory Visit | Attending: Cardiology | Admitting: Cardiology

## 2022-04-28 ENCOUNTER — Encounter (HOSPITAL_COMMUNITY): Payer: Self-pay | Admitting: Cardiology

## 2022-04-28 ENCOUNTER — Ambulatory Visit (HOSPITAL_COMMUNITY): Payer: Medicare HMO | Admitting: Certified Registered Nurse Anesthetist

## 2022-04-28 ENCOUNTER — Ambulatory Visit (HOSPITAL_BASED_OUTPATIENT_CLINIC_OR_DEPARTMENT_OTHER): Payer: Medicare HMO | Admitting: Certified Registered Nurse Anesthetist

## 2022-04-28 ENCOUNTER — Encounter (HOSPITAL_COMMUNITY)
Admission: RE | Disposition: A | Discharge status: Home / Self Care | Payer: Self-pay | Source: Home / Self Care | Attending: Cardiology

## 2022-04-28 ENCOUNTER — Ambulatory Visit (HOSPITAL_COMMUNITY)
Admission: RE | Admit: 2022-04-28 | Discharge: 2022-04-28 | Disposition: A | Discharge status: Home / Self Care | Payer: Medicare HMO | Attending: Cardiology | Admitting: Cardiology

## 2022-04-28 DIAGNOSIS — G4730 Sleep apnea, unspecified: Secondary | ICD-10-CM | POA: Diagnosis not present

## 2022-04-28 DIAGNOSIS — I10 Essential (primary) hypertension: Secondary | ICD-10-CM | POA: Diagnosis not present

## 2022-04-28 DIAGNOSIS — Z79899 Other long term (current) drug therapy: Secondary | ICD-10-CM | POA: Diagnosis not present

## 2022-04-28 DIAGNOSIS — I739 Peripheral vascular disease, unspecified: Secondary | ICD-10-CM | POA: Diagnosis not present

## 2022-04-28 DIAGNOSIS — I495 Sick sinus syndrome: Secondary | ICD-10-CM | POA: Insufficient documentation

## 2022-04-28 DIAGNOSIS — I4819 Other persistent atrial fibrillation: Secondary | ICD-10-CM | POA: Insufficient documentation

## 2022-04-28 DIAGNOSIS — I110 Hypertensive heart disease with heart failure: Secondary | ICD-10-CM | POA: Diagnosis not present

## 2022-04-28 DIAGNOSIS — Z7901 Long term (current) use of anticoagulants: Secondary | ICD-10-CM | POA: Diagnosis not present

## 2022-04-28 DIAGNOSIS — J8410 Pulmonary fibrosis, unspecified: Secondary | ICD-10-CM | POA: Insufficient documentation

## 2022-04-28 DIAGNOSIS — I4891 Unspecified atrial fibrillation: Secondary | ICD-10-CM | POA: Diagnosis not present

## 2022-04-28 DIAGNOSIS — E782 Mixed hyperlipidemia: Secondary | ICD-10-CM | POA: Insufficient documentation

## 2022-04-28 DIAGNOSIS — J449 Chronic obstructive pulmonary disease, unspecified: Secondary | ICD-10-CM | POA: Diagnosis not present

## 2022-04-28 DIAGNOSIS — I5032 Chronic diastolic (congestive) heart failure: Secondary | ICD-10-CM | POA: Diagnosis not present

## 2022-04-28 DIAGNOSIS — Z853 Personal history of malignant neoplasm of breast: Secondary | ICD-10-CM | POA: Diagnosis not present

## 2022-04-28 DIAGNOSIS — I081 Rheumatic disorders of both mitral and tricuspid valves: Secondary | ICD-10-CM | POA: Insufficient documentation

## 2022-04-28 DIAGNOSIS — I700 Atherosclerosis of aorta: Secondary | ICD-10-CM | POA: Insufficient documentation

## 2022-04-28 HISTORY — PX: TEE WITHOUT CARDIOVERSION: SHX5443

## 2022-04-28 HISTORY — PX: CARDIOVERSION: SHX1299

## 2022-04-28 LAB — POCT I-STAT, CHEM 8
BUN: 18 mg/dL (ref 8–23)
Calcium, Ion: 1.27 mmol/L (ref 1.15–1.40)
Chloride: 105 mmol/L (ref 98–111)
Creatinine, Ser: 1 mg/dL (ref 0.44–1.00)
Glucose, Bld: 86 mg/dL (ref 70–99)
HCT: 40 % (ref 36.0–46.0)
Hemoglobin: 13.6 g/dL (ref 12.0–15.0)
Potassium: 4.3 mmol/L (ref 3.5–5.1)
Sodium: 142 mmol/L (ref 135–145)
TCO2: 25 mmol/L (ref 22–32)

## 2022-04-28 LAB — ECHO TEE

## 2022-04-28 SURGERY — ECHOCARDIOGRAM, TRANSESOPHAGEAL
Anesthesia: Monitor Anesthesia Care

## 2022-04-28 MED ORDER — SODIUM CHLORIDE 0.9 % IV SOLN: INTRAVENOUS | Status: DC

## 2022-04-28 MED ORDER — METOPROLOL SUCCINATE ER 25 MG PO TB24: 25.0000 mg | ORAL_TABLET | Freq: Every day | ORAL | 1 refills | Status: DC

## 2022-04-28 MED ORDER — SODIUM CHLORIDE 0.9 % IV SOLN: INTRAVENOUS | Status: DC | PRN

## 2022-04-28 MED ORDER — PROPOFOL 500 MG/50ML IV EMUL
INTRAVENOUS | Status: DC | PRN
  Administered 2022-04-28: 100 ug/kg/min via INTRAVENOUS

## 2022-04-28 MED ORDER — PROPOFOL 10 MG/ML IV BOLUS
INTRAVENOUS | Status: DC | PRN
  Administered 2022-04-28: 40 mg via INTRAVENOUS

## 2022-04-28 SURGICAL SUPPLY — 1 items: ELECT DEFIB PAD ADLT CADENCE (PAD) ×2 IMPLANT

## 2022-04-28 NOTE — CV Procedure (Addendum)
Transesophageal echocardiogram (TEE) : Preliminary report 04/28/22  Sedation: See anesthesia records.   Left atrial appendage: Spontaneous echo contrast was not present.  No thrombus present.  But the study had to be aborted after limited TEE images due to apnea / hypoxia.   Supplemental oxygen and bag ventilation was provided and her oxygen saturation did come back to within normal limits.   Transthoracic images were obtained after cardioversion. See report for details.   Delilah Shan [facility]  Pager:  [phone] Office: [phone]   Direct current cardioversion:  Indication symptomatic: Symptomatic atrial fibrillation   Procedure Details:  Consent: Risks of procedure as well as the alternatives and risks of each were explained to the (patient/caregiver).  Consent for procedure obtained.  Time Out: Verified patient identification, verified procedure, site/side was marked, verified correct patient position, special equipment/implants available, medications/allergies/relevent history reviewed, required imaging and test results available. PERFORMED.  Patient placed on cardiac monitor, pulse oximetry, supplemental oxygen as necessary.  Sedation given:  see anesthesia records Pacer pads placed anterior and posterior chest.  Cardioverted 1 time(s).  Cardioversion with synchronized biphasic 200J shock.  Evaluation: Findings: Post procedure EKG shows:  Sinus bradycardia w/ rare ectopy. Complications: None Patient did tolerate procedure well. Spoke to son over the phone. He is aware of the holding parameters.      Delilah Shan [facility]  Pager:  [phone] Office: [phone] 04/28/2022, 11:41 AM

## 2022-04-28 NOTE — Transfer of Care (Signed)
Immediate Anesthesia Transfer of Care Note  Patient: Rachael [name]  Procedure(s) Performed: TRANSESOPHAGEAL ECHOCARDIOGRAM CARDIOVERSION  Patient Location: Cath Lab  Anesthesia Type:General  Level of Consciousness: drowsy and patient cooperative  Airway & Oxygen Therapy: Patient Spontanous Breathing and Patient connected to nasal cannula oxygen  Post-op Assessment: Report given to RN, Post -op Vital signs reviewed and stable, and Patient moving all extremities X 4  Post vital signs: Reviewed and stable  Last Vitals:  Vitals Value Taken Time  BP 110/52   Temp    Pulse 49   Resp 96   SpO2 97     Last Pain: There were no vitals filed for this visit.       Complications: No notable events documented.

## 2022-04-28 NOTE — Anesthesia Postprocedure Evaluation (Signed)
Anesthesia Post Note  Patient: TEASIA ZANINI  Procedure(s) Performed: TRANSESOPHAGEAL ECHOCARDIOGRAM CARDIOVERSION     Patient location during evaluation: Cath Lab Anesthesia Type: MAC Level of consciousness: awake and alert, patient cooperative and oriented Pain management: pain level controlled Vital Signs Assessment: post-procedure vital signs reviewed and stable Respiratory status: nonlabored ventilation, spontaneous breathing and respiratory function stable Cardiovascular status: blood pressure returned to baseline and stable Postop Assessment: no apparent nausea or vomiting and adequate PO intake Anesthetic complications: no   No notable events documented.  Last Vitals:  Vitals:   04/28/22 1148 04/28/22 1200  BP:  (!) 102/58  Pulse: (!) 44 (!) 52  Resp: 13 14  SpO2: 97% (!) 89%    Last Pain: There were no vitals filed for this visit.               Germaine Pomfret

## 2022-04-28 NOTE — Anesthesia Preprocedure Evaluation (Addendum)
Anesthesia Evaluation  Patient identified by MRN, date of birth, ID band Patient awake    Reviewed: Allergy & Precautions, NPO status , Patient's Chart, lab work & pertinent test results, reviewed documented beta blocker date and time   History of Anesthesia Complications Negative for: history of anesthetic complications  Airway Mallampati: II  TM Distance: >3 FB Neck ROM: Full    Dental  (+) Caps, Dental Advisory Given   Pulmonary COPD,  COPD inhaler   breath sounds clear to auscultation       Cardiovascular hypertension, Pt. on medications and Pt. on home beta blockers (-) angina + Peripheral Vascular Disease  (-) CAD  Rhythm:Irregular Rate:Normal  '23 stress: Myocardial perfusion is normal. Normal left ventricle.  LV rest volume 109 ml.  LV stress volume 128 ml. TID ratio 1.17. Overall LV systolic function is mildly abnormal without regional wall motion abnormalities. Stress LV EF: 44%.  Global hypokinesis of the left ventricle.  '22 ECHO: EF 60-65%. LV EF 63 %. The left ventricle has normal function, no regional wall motion abnormalities. There is mild LVH. Grade I diastolic dysfunction (impaired relaxation). RVF is normal. The right ventricular size is normal. There is normal pulmonary artery systolic pressure, trivial MR      Neuro/Psych  Headaches  Anxiety     TIA   GI/Hepatic Neg liver ROS,GERD  Medicated and Controlled,,  Endo/Other  Hypothyroidism    Renal/GU Renal InsufficiencyRenal disease     Musculoskeletal   Abdominal   Peds  Hematology eliquis   Anesthesia Other Findings   Reproductive/Obstetrics                              Anesthesia Physical Anesthesia Plan  ASA: 3  Anesthesia Plan: MAC   Post-op Pain Management: Minimal or no pain anticipated   Induction:   PONV Risk Score and Plan: 2 and Treatment may vary due to age or medical condition  Airway Management  Planned: Natural Airway and Nasal Cannula  Additional Equipment: None  Intra-op Plan:   Post-operative Plan:   Informed Consent: I have reviewed the patients History and Physical, chart, labs and discussed the procedure including the risks, benefits and alternatives for the proposed anesthesia with the patient or authorized representative who has indicated his/her understanding and acceptance.     Dental advisory given  Plan Discussed with: CRNA and Surgeon  Anesthesia Plan Comments:         Anesthesia Quick Evaluation

## 2022-04-28 NOTE — Interval H&P Note (Signed)
History and Physical Interval Note:  04/28/2022 11:01 AM  Rachael [name]  has presented today for surgery, with the diagnosis of AFIB.  The various methods of treatment have been discussed with the patient and family. After consideration of risks, benefits and other options for treatment, the patient has consented to  Procedure(s): TRANSESOPHAGEAL ECHOCARDIOGRAM (N/A) CARDIOVERSION (N/A) as a surgical intervention.  The patient's history has been reviewed, patient examined, no change in status, stable for surgery.  I have reviewed the patient's chart and labs.  Questions were answered to the patient's satisfaction.    No changes in medical history since last office visit.   Shared Decision Making/Informed Consent The risks [stroke, cardiac arrhythmias rarely resulting in the need for a temporary or permanent pacemaker, skin irritation or burns, esophageal damage, perforation (1:10,000 risk), bleeding, pharyngeal hematoma as well as other potential complications associated with conscious sedation including aspiration, arrhythmia, respiratory failure and death], benefits (treatment guidance, restoration of normal sinus rhythm, diagnostic support) and alternatives of a transesophageal echocardiogram guided cardioversion were discussed in detail with Rachael [name] and she is willing to proceed.   Tessa Lerner, [place], [facility]  Pager:  [phone] Office: [phone]

## 2022-04-28 NOTE — Anesthesia Procedure Notes (Signed)
Procedure Name: MAC Date/Time: 04/28/2022 11:12 AM  Performed by: Alease Medina, CRNAPre-anesthesia Checklist: Patient identified, Emergency Drugs available, Suction available and Patient being monitored Patient Re-evaluated:Patient Re-evaluated prior to induction Oxygen Delivery Method: Nasal cannula

## 2022-04-28 NOTE — Progress Notes (Signed)
  Echocardiogram Echocardiogram Transesophageal has been performed.  Janalyn Harder 04/28/2022, 11:38 AM

## 2022-04-29 ENCOUNTER — Encounter (HOSPITAL_COMMUNITY): Payer: Self-pay | Admitting: Cardiology

## 2022-04-29 ENCOUNTER — Telehealth: Payer: Self-pay

## 2022-04-29 ENCOUNTER — Ambulatory Visit: Payer: Medicare HMO | Admitting: Cardiology

## 2022-04-29 NOTE — Telephone Encounter (Signed)
Patient IV site swelling and bruised. She is concerned about blood clot. Advised patient to apply heat to swollen area and if swelling persist she should go to urgent care.

## 2022-05-04 ENCOUNTER — Encounter: Payer: Self-pay | Admitting: Family Medicine

## 2022-05-04 ENCOUNTER — Ambulatory Visit (INDEPENDENT_AMBULATORY_CARE_PROVIDER_SITE_OTHER): Payer: Medicare HMO | Admitting: Family Medicine

## 2022-05-04 VITALS — BP 102/64 | HR 63 | Temp 97.4°F | Wt 195.0 lb

## 2022-05-04 DIAGNOSIS — I5032 Chronic diastolic (congestive) heart failure: Secondary | ICD-10-CM | POA: Diagnosis not present

## 2022-05-04 DIAGNOSIS — N3281 Overactive bladder: Secondary | ICD-10-CM

## 2022-05-04 DIAGNOSIS — I4819 Other persistent atrial fibrillation: Secondary | ICD-10-CM | POA: Diagnosis not present

## 2022-05-04 MED ORDER — OXYBUTYNIN CHLORIDE ER 10 MG PO TB24: 10.0000 mg | ORAL_TABLET | Freq: Every day | ORAL | 5 refills | Status: DC

## 2022-05-04 NOTE — Progress Notes (Signed)
   Subjective:    Patient ID: Rachael [name], female    DOB: 1933/03/17, 89 y.o.   MRN: [id number]  HPI Here with her son to follow up on a few issues. She had a TEE and a cardioversion per Dr. Tessa Lerner on 04-28-22. She was successfully cardioverted to sinus rhythm. Her EF was 55-60% with no LV wall motion abnormalities and no diastolic dysfunction. Also at our last visit we started her on Oxybutynin XR 5 mg daily for her OAB. This has not helped her at all. No side effects to report. She feels well in general. She is progressing with PT, and she was able to walk 100 feet unassisted at the last session. She is still using the walker for now.    Review of Systems  Constitutional: Negative.   Respiratory: Negative.    Cardiovascular: Negative.   Neurological: Negative.        Objective:   Physical Exam Constitutional:      Appearance: She is not ill-appearing.     Comments: Using her walker   Cardiovascular:     Rate and Rhythm: Normal rate. Rhythm irregular.     Pulses: Normal pulses.     Heart sounds: Normal heart sounds.  Pulmonary:     Effort: Pulmonary effort is normal.     Breath sounds: Normal breath sounds.  Musculoskeletal:     Comments: 2+ edema in both ankles   Neurological:     Mental Status: She is alert.           Assessment & Plan:  She seems to have slipped back into atrial fibrillation. Her BP and HR are stable. She will follow up with Dr. Odis Hollingshead on 05-06-22. For the OAB, we will increase the Oxybutynin XR to 10 mg daily.  Gershon Crane, MD

## 2022-05-05 DIAGNOSIS — R2689 Other abnormalities of gait and mobility: Secondary | ICD-10-CM | POA: Diagnosis not present

## 2022-05-05 DIAGNOSIS — R531 Weakness: Secondary | ICD-10-CM | POA: Diagnosis not present

## 2022-05-05 DIAGNOSIS — R2681 Unsteadiness on feet: Secondary | ICD-10-CM | POA: Diagnosis not present

## 2022-05-06 ENCOUNTER — Telehealth: Payer: Self-pay | Admitting: Pulmonary Disease

## 2022-05-06 ENCOUNTER — Encounter: Payer: Self-pay | Admitting: Cardiology

## 2022-05-06 ENCOUNTER — Ambulatory Visit: Payer: Medicare HMO | Admitting: Cardiology

## 2022-05-06 VITALS — BP 133/72 | HR 85 | Resp 16 | Ht 67.0 in | Wt 196.0 lb

## 2022-05-06 DIAGNOSIS — I4891 Unspecified atrial fibrillation: Secondary | ICD-10-CM

## 2022-05-06 NOTE — Progress Notes (Signed)
ID:  Rachael [name], DOB [birth year], MRN [id number]  PCP:  Nelwyn Salisbury, MD  Cardiologist:  Tessa Lerner, DO, [facility] (established care 04/09/2021) Former Cardiology Providers: Dr. Lynnette Caffey and Dr. Ladona Ridgel Pulmonary Provider: Dr. Chilton Greathouse.   Date: 05/06/22 Last Office Visit: 04/12/2022  Chief Complaint  Patient presents with   Persistent atrial fibrillation   Follow-up    HPI  Rachael [name] is a 89 y.o. Caucasian female whose past medical history and cardiovascular risk factors include: Persistent atrial fibrillation (status post cardioversion 04/28/2022), pulmonary fibrosis likely idiopathic and possible UIP pattern, Sleep Apnea w/ nocturnal desaturation, hypertension,, aortic atherosclerosis, hypothyroidism, hyperlipidemia, HFpEF, sinus node dysfunction, hx of breast cancer, advanced age, postmenopausal female.  Initially referred to the practice for bradycardia/SVT.  Prior to establishing care she has already been evaluated by Dr. Ladona Ridgel and there was concerns for tachybradycardia syndrome and sinus node dysfunction which will eventually require pacemaker implant but clinically not needed at that time.  She is being treated for HFpEF and did undergo ischemic workup but was later lost to follow-up.  In the interim she was diagnosed with pulmonary fibrosis suggestive of UIP pattern, sleep apnea, as well as nocturnal hypoxia.  Thereafter in February 2024 she was diagnosed with atrial fibrillation and has been on medical therapy for rate control strategy and Eliquis for thromboembolic prophylaxis.  Given the fact that she was in sinus rhythm several months ago shared decision was to proceed with restoring sinus rhythm.  Antiarrhythmic aspirates have been limited due to factors patient was advanced age, pulmonary fibrosis, bradycardia.  She underwent cardioversion on April 28, 2022 which restored normal sinus rhythm.  She presents today for 1 week follow-up after cardioversion.  EKG shows  illustrates atrial fibrillation with controlled ventricular rate.  Clinically denies anginal chest pain or heart failure symptoms.  She is accompanied by her son [name] at today's visit  FUNCTIONAL STATUS: Ambulates with a walker.   ALLERGIES: Allergies  Allergen Reactions   Shellfish Allergy Anaphylaxis and Hives    Hives all over the body.    Percocet [Oxycodone-Acetaminophen] Hives   Iodinated Contrast Media Other (See Comments)    PT IS NOT AWARE OF IODINE ALLERGY, PREMEDICATED FOR PRIOR PREMEDS ONLY//A.C. - unknown reaction   Iodine Hives    MEDICATION LIST PRIOR TO VISIT: Current Meds  Medication Sig   albuterol (VENTOLIN HFA) 108 (90 Base) MCG/ACT inhaler Inhale 2 puffs into the lungs every 4 (four) hours as needed for wheezing or shortness of breath.   allopurinol (ZYLOPRIM) 100 MG tablet Take 1 tablet (100 mg total) by mouth daily.   apixaban (ELIQUIS) 5 MG TABS tablet Take 1 tablet (5 mg total) by mouth 2 (two) times daily.   atorvastatin (LIPITOR) 40 MG tablet Take 40 mg by mouth every evening.   Calcium Carbonate (CALCIUM 600 PO) Take 600 mg by mouth 2 (two) times daily.   Cholecalciferol 50 MCG (2000 UT) CAPS Take 2,000 Units by mouth in the morning and at bedtime.   dapagliflozin propanediol (FARXIGA) 10 MG TABS tablet Take 1 tablet (10 mg total) by mouth daily before breakfast.   furosemide (LASIX) 20 MG tablet Take 1 tablet (20 mg total) by mouth daily as needed for fluid. Take one tablet once a day   levothyroxine (SYNTHROID) 112 MCG tablet Take 1 tablet (112 mcg total) by mouth daily before breakfast.   LORazepam (ATIVAN) 0.5 MG tablet Take 0.5 mg by mouth as needed.   metoprolol succinate (TOPROL-XL)  25 MG 24 hr tablet Take 1 tablet (25 mg total) by mouth daily. Hold if systolic blood pressure (top number) less than 100 mmHg or pulse less than 60 bpm.   Nintedanib (OFEV) 150 MG CAPS Take 1 capsule (150 mg total) by mouth 2 (two) times daily.   ondansetron  (ZOFRAN-ODT) 4 MG disintegrating tablet Take 1 tablet (4 mg total) by mouth every 8 (eight) hours as needed for nausea or vomiting (for nausea from wegovy or other source).   OVER THE COUNTER MEDICATION Take 2 capsules by mouth daily. Juice Plus Berry Blend   oxybutynin (DITROPAN-XL) 10 MG 24 hr tablet Take 1 tablet (10 mg total) by mouth at bedtime.     PAST MEDICAL HISTORY: Past Medical History:  Diagnosis Date   Anxiety state 11/14/2007   Qualifier: Diagnosis of  By: Jonny Ruiz MD, Len Blalock    Breast cancer [facility])    R   CAROTID STENOSIS    Chronic venous insufficiency    CONTACT DERMATITIS    DUPUYTREN'S CONTRACTURE    Edema    pt  states had edema of feet and legs has been on lasix per dr. to help   GLUCOSE INTOLERANCE    HLD (hyperlipidemia)    Hypothyroidism    Dr. Cherlyn Roberts in [place] [facility]    MENOPAUSAL SYNDROME    Obesity    Osteoarthritis    bilat hips, severe   PVD (peripheral vascular disease) (HCC)    mild carotid 11/05   VITAMIN D DEFICIENCY     PAST SURGICAL HISTORY: Past Surgical History:  Procedure Laterality Date   2D echo  11/29/2003   normal LV size. normal EF    BACK SURGERY  12/06/2016   L3-L5 laminectomies per Dr. Bettey Mare at [facility]   BREAST BIOPSY  05/11/2011   right breast   BREAST LUMPECTOMY     right breast   CARDIOVERSION N/A 04/28/2022   Procedure: CARDIOVERSION;  Surgeon: Tessa Lerner, DO;  Location: [facility] INVASIVE CV LAB;  Service: Cardiovascular;  Laterality: N/A;   CHOLECYSTECTOMY     FINGER SURGERY     HERNIA REPAIR     incisional hernia   INGUINAL HERNIA REPAIR     JOINT REPLACEMENT Bilateral    Dr. Despina Hick   normal caronary arteries     per pt. 2003   right rotator cuff surgery     SPINE SURGERY     TEE WITHOUT CARDIOVERSION N/A 04/28/2022   Procedure: TRANSESOPHAGEAL ECHOCARDIOGRAM;  Surgeon: Tessa Lerner, DO;  Location: [facility] INVASIVE CV LAB;  Service: Cardiovascular;  Laterality: N/A;   TONSILLECTOMY     TOTAL HIP  ARTHROPLASTY  05/2008 and 10/2008   B hip - alusio    FAMILY HISTORY: The patient family history includes Breast cancer (age of onset: 50) in her daughter; Cancer in her daughter and mother; Clotting disorder in her maternal grandmother; Colon cancer in her cousin; Coronary artery disease in an other family member; Diabetes in her son; Heart disease in her brother and father; Hypothyroidism in an other family member; Leukemia in her cousin; Ovarian cancer in her mother; Pancreatic cancer in her maternal grandfather; Prostate cancer in her cousin, maternal uncle, and paternal uncle; Uterine cancer in her cousin; Uterine cancer (age of onset: 4) in her mother; Varicose Veins in her father.  SOCIAL HISTORY:  The patient  reports that she has never smoked. She has been exposed to tobacco smoke. She has never used smokeless tobacco. She reports that she  does not drink alcohol and does not use drugs.  REVIEW OF SYSTEMS: Review of Systems  Cardiovascular:  Positive for dyspnea on exertion (chronic  and stable) and leg swelling (Stable.). Negative for chest pain, orthopnea, palpitations and syncope.  Respiratory:  Positive for shortness of breath (chronic and stable).     PHYSICAL EXAM:    05/06/2022   11:45 AM 05/04/2022    1:14 PM 04/28/2022   12:00 PM  Vitals with BMI  Height 5\' 7"     Weight 196 lbs 195 lbs   BMI 30.69 30.53   Systolic 133 102 161  Diastolic 72 64 58  Pulse 85 63 52   Physical Exam  Constitutional: No distress.  Age appropriate, hemodynamically stable, walks w/ walker  Neck: No JVD present.  Cardiovascular: Normal rate, S1 normal, S2 normal, intact distal pulses and normal pulses. An irregularly irregular rhythm present. Exam reveals no gallop, no S3 and no S4.  No murmur heard. Pulmonary/Chest: Effort normal. No stridor. She has no wheezes. Bilateral dry rhonchi's.  Abdominal: Soft. Bowel sounds are normal. She exhibits no distension. There is no abdominal tenderness.   Musculoskeletal:        General: Edema (trace R>L) present.     Cervical back: Neck supple.  Neurological: She is alert and oriented to person, place, and time. She has intact cranial nerves (2-12).  Skin: Skin is warm and moist.   CARDIAC DATABASE: EKG: 03/01/2022: Atrial fibrillation, 100 bpm. May 06, 2022: Atrial fibrillation, 78 bpm, low voltage, nonspecific ST-T changes.  Echocardiogram: 11/24/2020: LVEF 60-65%, no regional wall motion abnormalities, mild LVH, grade 1 diastolic dysfunction, moderately dilated left atrium, trivial MR, trivial AR, aortic root at 39 mm, proximal ascending aorta 40 mm, estimated RAP 3 mmHg, small PFO with left-to-right shunting.   Stress Testing: Lexiscan Nuclear stress test 05/13/2021: Nondiagnostic ECG stress. The heart rate response was consistent with Lexiscan.  Myocardial perfusion is normal. Normal left ventricle. LV rest volume 109 ml. LV stress volume 128 ml. TID ratio 1.17. Overall LV systolic function is mildly abnormal without regional wall motion abnormalities. Stress LV EF: 44%. Global hypokinesis of the left ventricle. No previous exam available for comparison. Intermediate risk study due to reduced LVEF. Correlate with echocardiogram.   Heart Catheterization: None  Cardiac monitor: November 2022 Patch Wear Time:  3 days and 23 hours    Patient had a min HR of 38 bpm, max HR of 171 bpm, and avg HR of 57 bpm.    Predominant underlying rhythm was Sinus Rhythm.    EVENTS: 43 Supraventricular Tachycardia runs occurred, the run with the fastest interval lasting 7 beats with a max rate of 171 bpm, the longest lasting 1 min 20 secs with an avg rate of 139 bpm.    3 Pauses occurred, the longest lasting 3.1 secs (19 bpm).  3 pauses were noted during the monitoring period. 11/19/2020 at 6:28 AM 3.1-second pause. 11/18/2020 at 4:36 PM 3.1-second pause. 11/18/2020 at 3:14 PM 3-second pause.   Sinus bradycardia burden 38%.     Some  episodes of Supraventricular Tachycardia may be possible Atrial Tachycardia with variable block. Isolated SVEs were rare (<1.0%), SVE Couplets were rare (<1.0%), and SVE Triplets were rare (<1.0%). Isolated VEs were rare (<1.0%, 729), VE Couplets were rare (<1.0%, 10), and VE Triplets were rare (<1.0%, 2). Ventricular Trigeminy was present.   No atrial fibrillation or ventricular arrhythmias were detected.  Carotid duplex 02/26/2018: Bilateral ICA stenosis 1-39%, bilateral vertebral antegrade flows  normal bilateral subclavian flows.  No significant change compared to study from 2015.  LABORATORY DATA:    Latest Ref Rng & Units 04/28/2022   11:05 AM 04/07/2022    2:59 PM 03/01/2022   11:26 AM  CBC  WBC 4.0 - 10.5 K/uL   8.8   Hemoglobin 12.0 - 15.0 g/dL 16.1  09.6  04.5   Hematocrit 36.0 - 46.0 % 40.0  42.5  38.8   Platelets 150 - 400 K/uL   321        Latest Ref Rng & Units 04/28/2022   11:05 AM 04/19/2022   11:21 AM 04/07/2022    2:59 PM  CMP  Glucose 70 - 99 mg/dL 86  89  82   BUN 8 - 23 mg/dL 18  24  21    Creatinine 0.44 - 1.00 mg/dL 4.09  8.11  9.14   Sodium 135 - 145 mmol/L 142  142  143   Potassium 3.5 - 5.1 mmol/L 4.3  3.9  4.5   Chloride 98 - 111 mmol/L 105  104  101   CO2 19 - 32 mEq/L  29  25   Calcium 8.4 - 10.5 mg/dL  78.2  95.6   Total Protein 6.0 - 8.3 g/dL  6.4    Total Bilirubin 0.2 - 1.2 mg/dL  1.2    Alkaline Phos 39 - 117 U/L  79    AST 0 - 37 U/L  18    ALT 0 - 35 U/L  13      Lipid Panel  Lab Results  Component Value Date   CHOL 127 11/20/2021   HDL 51.10 11/20/2021   LDLCALC 61 11/20/2021   LDLDIRECT 115.8 11/03/2011   TRIG 76.0 11/20/2021   CHOLHDL 2 11/20/2021    No components found for: "NTPROBNP" Recent Labs    05/12/21 1259 04/07/22 1459 04/19/22 1121  PROBNP 718 2,529* 1,892*   Recent Labs    11/20/21 0918  TSH 0.40    BMP Recent Labs    06/11/21 0719 11/20/21 0918 01/10/22 0955 03/01/22 1126 04/07/22 1459 04/19/22 1121  04/28/22 1105  NA 139   < > 141 141 143 142 142  K 3.9   < > 3.9 4.0 4.5 3.9 4.3  CL 108   < > 105 103 101 104 105  CO2 21*   < > 27 24 25 29   --   GLUCOSE 158*   < > 110* 101* 82 89 86  BUN 16   < > 16 12 21  24* 18  CREATININE 1.01*   < > 0.93 0.89 0.99 1.12 1.00  CALCIUM 9.8   < > 9.4 9.8 10.2 10.2  --   GFRNONAA 55*  --  60* >60  --   --   --    < > = values in this interval not displayed.    HEMOGLOBIN A1C Lab Results  Component Value Date   HGBA1C 5.9 11/20/2021   MPG 100 02/25/2018    IMPRESSION:    ICD-10-CM   1. Atrial fibrillation, unspecified type (HCC)  I48.91 EKG 12-Lead    EKG 12-Lead       RECOMMENDATIONS: Rachael [name] is a 89 y.o. Caucasian female whose past medical history and cardiac risk factors include: pulmonary fibrosis likely idiopathic and possible UIP pattern, Sleep Apnea w/ nocturnal desaturation, hypertension,, aortic atherosclerosis, hypothyroidism, hyperlipidemia, HFpEF, sinus node dysfunction, hx of breast cancer, advanced age, postmenopausal female.  Persistent atrial fibrillation (HCC) Rate control:  Metoprolol. Rhythm control: NA Thromboembolic prophylaxis: Eliquis History of direct-current cardioversion on April 28, 2022 with 200 J x 1 ERAF since her direct-current cardioversion.  Maintaining sinus rhythm has been difficult for the patient likely secondary to her underlying pulmonary fibrosis, sleep apnea, and oxygen dependence at night.  Patient would like to be referred to EP for further evaluation and management of her atrial fibrillation and restoration /maintenance of sinus rhythm. Flecainide in the past discontinued due to bradycardia. Not a great candidate for amiodarone in the setting of baseline pulmonary fibrosis/possible UIP. Medication such as Tikosyn/sotalol will need hospitalization -she does not appear to be too inclined. No prior history of gastrointestinal or intracranial bleed. CHA2DS2-VASc SCORE is 6 which correlates to  9.7% risk of stroke per year.  Long term (current) use of anticoagulants Indication: Persistent atrial fibrillation. Risks, benefits, and alternatives to anticoagulation discussed with both patient and daughter at today's office visit. Samples for Eliquis provided. I have asked her to reach out to her insurance company to see if Xarelto may be on formulary and if so we can transition to Xarelto.  Chronic heart failure with preserved ejection fraction (HFpEF) (HCC) Stage B, NYHA class II/III No recent hospitalizations for congestive heart failure. Continue Toprol-XL, [place]. Will discuss initiation of Entresto. Uses Lasix on as needed basis.  Essential hypertension Office blood pressures are well-controlled. Monitor for now  Pulmonary fibrosis [facility]) Currently follows up with Dr. Isaiah Serge Is doing well on the current pharmacological therapy.  Sleep apnea in adult Reemphasized importance of device compliance.  FINAL MEDICATION LIST END OF ENCOUNTER: No orders of the defined types were placed in this encounter.   There are no discontinued medications.    Current Outpatient Medications:    albuterol (VENTOLIN HFA) 108 (90 Base) MCG/ACT inhaler, Inhale 2 puffs into the lungs every 4 (four) hours as needed for wheezing or shortness of breath., Disp: 18 g, Rfl: 2   allopurinol (ZYLOPRIM) 100 MG tablet, Take 1 tablet (100 mg total) by mouth daily., Disp: 30 tablet, Rfl: 2   apixaban (ELIQUIS) 5 MG TABS tablet, Take 1 tablet (5 mg total) by mouth 2 (two) times daily., Disp: 180 tablet, Rfl: 1   atorvastatin (LIPITOR) 40 MG tablet, Take 40 mg by mouth every evening., Disp: , Rfl:    Calcium Carbonate (CALCIUM 600 PO), Take 600 mg by mouth 2 (two) times daily., Disp: , Rfl:    Cholecalciferol 50 MCG (2000 UT) CAPS, Take 2,000 Units by mouth in the morning and at bedtime., Disp: , Rfl:    dapagliflozin propanediol (FARXIGA) 10 MG TABS tablet, Take 1 tablet (10 mg total) by mouth daily before  breakfast., Disp: 90 tablet, Rfl: 0   furosemide (LASIX) 20 MG tablet, Take 1 tablet (20 mg total) by mouth daily as needed for fluid. Take one tablet once a day, Disp: 90 tablet, Rfl: 3   levothyroxine (SYNTHROID) 112 MCG tablet, Take 1 tablet (112 mcg total) by mouth daily before breakfast., Disp: 90 tablet, Rfl: 3   LORazepam (ATIVAN) 0.5 MG tablet, Take 0.5 mg by mouth as needed., Disp: , Rfl:    metoprolol succinate (TOPROL-XL) 25 MG 24 hr tablet, Take 1 tablet (25 mg total) by mouth daily. Hold if systolic blood pressure (top number) less than 100 mmHg or pulse less than 60 bpm., Disp: 90 tablet, Rfl: 1   Nintedanib (OFEV) 150 MG CAPS, Take 1 capsule (150 mg total) by mouth 2 (two) times daily., Disp: 180 capsule, Rfl: 1  ondansetron (ZOFRAN-ODT) 4 MG disintegrating tablet, Take 1 tablet (4 mg total) by mouth every 8 (eight) hours as needed for nausea or vomiting (for nausea from wegovy or other source)., Disp: 30 tablet, Rfl: 0   OVER THE COUNTER MEDICATION, Take 2 capsules by mouth daily. Juice Plus [employer], Disp: , Rfl:    oxybutynin (DITROPAN-XL) 10 MG 24 hr tablet, Take 1 tablet (10 mg total) by mouth at bedtime., Disp: 30 tablet, Rfl: 5  Orders Placed This Encounter  Procedures   EKG 12-Lead   EKG 12-Lead   There are no Patient Instructions on file for this visit.   --Continue cardiac medications as reconciled in final medication list. --Return in about 6 months (around 11/05/2022) for Follow up, A. fib. Or sooner if needed. --Continue follow-up with your primary care physician regarding the management of your other chronic comorbid conditions.  Patient's questions and concerns were addressed to her satisfaction. She voices understanding of the instructions provided during this encounter.   This note was created using a voice recognition software as a result there may be grammatical errors inadvertently enclosed that do not reflect the nature of this encounter. Every attempt  is made to correct such errors.  Tessa Lerner, [place], [facility]  Pager:  [phone] Office: [phone]

## 2022-05-06 NOTE — Telephone Encounter (Signed)
Pt. Son calling about his mom taking Ofev med and if it is contributing to her A-Fib issues and what to speak to someone since she has ben to see th Cardio Dr.

## 2022-05-07 ENCOUNTER — Telehealth: Payer: Self-pay | Admitting: Internal Medicine

## 2022-05-07 ENCOUNTER — Telehealth: Payer: Self-pay | Admitting: Family Medicine

## 2022-05-07 NOTE — Telephone Encounter (Signed)
Pt called back per message received from [facility] Triage.    Pt states she was seen on 4/17 in ER for Afib.  Yesterday she saw Dr. Odis Hollingshead, that recommended EP appointment with Dr. Lewayne Bunting.  Pt has not seen Dr. Ladona Ridgel since 2022.    Pt states she is in and out of AFIB.  I asked Pt what her vital signs were this morning, Pt stated BP was 118/60 and HR was 93 bpm.  Pt advised Dr. Ladona Ridgel is out on vacation this next week, and if her symptoms worsen, or cause additional symptoms over the weekend, that she will need to go to the ER for providers care.   Pt advised that I will reach out to scheduling, and they will contact her with appointment availability.  Pt verbalized understanding to this plan, and stated she will go to ER for worsening symptoms.

## 2022-05-07 NOTE — Telephone Encounter (Signed)
Patient c/o Palpitations:  High priority if patient c/o lightheadedness, shortness of breath, or chest pain  How long have you had palpitations/irregular HR/ Afib? Are you having the symptoms now? Afib since February  Are you currently experiencing lightheadedness, SOB or CP? No   Do you have a history of afib (atrial fibrillation) or irregular heart rhythm? Yes   Have you checked your BP or HR? (document readings if available): BP is 120 -140's/60 or 70's  Are you experiencing any other symptoms? SOB   Patient had cardioversion on 4/17 by Dr. Odis Hollingshead. Diagnosised with ILD last year and not sure if that is causing SOB and afib Per son, patient is only wanting to see Dr. Ladona Ridgel

## 2022-05-07 NOTE — Telephone Encounter (Signed)
Contacted [name] to schedule their annual wellness visit. Appointment made for 05/21/22.  Rudell Cobb AWV direct phone # [phone]*

## 2022-05-10 ENCOUNTER — Telehealth: Payer: Self-pay | Admitting: Family Medicine

## 2022-05-10 NOTE — Telephone Encounter (Signed)
Checking on PA for oxybutynin (DITROPAN-XL) 10 MG 24 hr tablet

## 2022-05-10 NOTE — Telephone Encounter (Signed)
There is 2-3% increased risk for stroke and MI but not specifically afib for patients on Ofev (this is based on INPULSIS trial). Returned call to patient and her son, Joylene Igo. She has been advised that there is no compelling evidence with Ofev in contributing to  worsening of atrial fibrillation though there are other cardiac risks that have been seen  Patient has f/u w afib clinic (Dr. Ladona Ridgel) on 05/17/22. She has been advised to have him reach out to [place] directly if any questions about Ofev  Chesley Mires, PharmD, MPH, BCPS, CPP Clinical Pharmacist (Rheumatology and Pulmonology)

## 2022-05-11 ENCOUNTER — Telehealth: Payer: Self-pay | Admitting: Family Medicine

## 2022-05-11 ENCOUNTER — Ambulatory Visit: Payer: Medicare HMO | Admitting: Cardiology

## 2022-05-11 NOTE — Telephone Encounter (Signed)
Error/njr °

## 2022-05-11 NOTE — Telephone Encounter (Signed)
Pt son Rachael [name] is calling checking on PA for  oxybutynin (DITROPAN-XL) 10 MG 24 hr tablet 30 tablet 5 05/04/2022    Sig - Route: Take 1 tablet (10 mg total) by mouth at bed   He is aware can take up to 7 business days

## 2022-05-12 ENCOUNTER — Telehealth: Payer: Self-pay | Admitting: Family Medicine

## 2022-05-12 ENCOUNTER — Other Ambulatory Visit: Payer: Self-pay

## 2022-05-12 MED ORDER — OXYBUTYNIN CHLORIDE ER 5 MG PO TB24: ORAL_TABLET | ORAL | 5 refills | Status: DC

## 2022-05-12 MED ORDER — OXYBUTYNIN CHLORIDE ER 5 MG PO TB24: 5.0000 mg | ORAL_TABLET | Freq: Every day | ORAL | 5 refills | Status: DC

## 2022-05-12 NOTE — Telephone Encounter (Signed)
Pt son roy is aware PA for oxybutynin (DITROPAN-XL) 10 MG 24 hr tablet is in progress however pt was on 5 mg and he would like dr fry recommendation. He said 5 mg did not need PA . Please advise

## 2022-05-12 NOTE — Telephone Encounter (Signed)
Pt.notified

## 2022-05-12 NOTE — Telephone Encounter (Signed)
Dr Clent Ridges changed the dose to 5mg  BID, No need for the PA

## 2022-05-12 NOTE — Telephone Encounter (Signed)
PA sent to plan

## 2022-05-12 NOTE — Telephone Encounter (Signed)
We can try stopping the 10 mg RX, and instead call in Oxybutynin XR 5 mg to take 2 tablets (a total of 10 mg) at bedtime, #60 with 5 rf

## 2022-05-12 NOTE — Telephone Encounter (Signed)
Rx sent for the 5 mg per Dr Clent Ridges

## 2022-05-13 DIAGNOSIS — R531 Weakness: Secondary | ICD-10-CM | POA: Diagnosis not present

## 2022-05-13 DIAGNOSIS — R2681 Unsteadiness on feet: Secondary | ICD-10-CM | POA: Diagnosis not present

## 2022-05-13 DIAGNOSIS — R2689 Other abnormalities of gait and mobility: Secondary | ICD-10-CM | POA: Diagnosis not present

## 2022-05-17 ENCOUNTER — Encounter: Payer: Self-pay | Admitting: Internal Medicine

## 2022-05-17 ENCOUNTER — Ambulatory Visit: Payer: Medicare HMO | Attending: Internal Medicine | Admitting: Internal Medicine

## 2022-05-17 VITALS — BP 118/78 | HR 100 | Ht 67.0 in | Wt 194.0 lb

## 2022-05-17 DIAGNOSIS — I4891 Unspecified atrial fibrillation: Secondary | ICD-10-CM | POA: Diagnosis not present

## 2022-05-17 DIAGNOSIS — I10 Essential (primary) hypertension: Secondary | ICD-10-CM | POA: Diagnosis not present

## 2022-05-17 NOTE — Progress Notes (Signed)
HPI Rachael [name] returns today for followup. She is a pleasant 89 yo woman I saw 2 years ago for palpitations. She had SVT, likely AT. She was also noted to have dyspnea and I had her undergo PFTs  which were normal. Since then she has been diagnosed with pulmonary fibrosis and underwent TEE guided DCCV in April with desaturation during the procedure. She had ERAF about a week later. Whe she was in NSR initially after the procedure she notes that she feels better in rhythm. Previously she had some sinus node dysfunction.  Allergies  Allergen Reactions   Shellfish Allergy Anaphylaxis and Hives    Hives all over the body.    Percocet [Oxycodone-Acetaminophen] Hives   Iodinated Contrast Media Other (See Comments)    PT IS NOT AWARE OF IODINE ALLERGY, PREMEDICATED FOR PRIOR PREMEDS ONLY//A.C. - unknown reaction   Iodine Hives     Current Outpatient Medications  Medication Sig Dispense Refill   albuterol (VENTOLIN HFA) 108 (90 Base) MCG/ACT inhaler Inhale 2 puffs into the lungs every 4 (four) hours as needed for wheezing or shortness of breath. 18 g 2   allopurinol (ZYLOPRIM) 100 MG tablet Take 1 tablet (100 mg total) by mouth daily. 30 tablet 2   apixaban (ELIQUIS) 5 MG TABS tablet Take 1 tablet (5 mg total) by mouth 2 (two) times daily. 180 tablet 1   atorvastatin (LIPITOR) 40 MG tablet Take 40 mg by mouth every evening.     Calcium Carbonate (CALCIUM 600 PO) Take 600 mg by mouth 2 (two) times daily.     Cholecalciferol 50 MCG (2000 UT) CAPS Take 2,000 Units by mouth in the morning and at bedtime.     dapagliflozin propanediol (FARXIGA) 10 MG TABS tablet Take 1 tablet (10 mg total) by mouth daily before breakfast. 90 tablet 0   furosemide (LASIX) 20 MG tablet Take 1 tablet (20 mg total) by mouth daily as needed for fluid. Take one tablet once a day 90 tablet 3   levothyroxine (SYNTHROID) 112 MCG tablet Take 1 tablet (112 mcg total) by mouth daily before breakfast. 90 tablet 3   LORazepam  (ATIVAN) 0.5 MG tablet Take 0.5 mg by mouth as needed.     metoprolol succinate (TOPROL-XL) 25 MG 24 hr tablet Take 1 tablet (25 mg total) by mouth daily. Hold if systolic blood pressure (top number) less than 100 mmHg or pulse less than 60 bpm. 90 tablet 1   Nintedanib (OFEV) 150 MG CAPS Take 1 capsule (150 mg total) by mouth 2 (two) times daily. 180 capsule 1   ondansetron (ZOFRAN-ODT) 4 MG disintegrating tablet Take 1 tablet (4 mg total) by mouth every 8 (eight) hours as needed for nausea or vomiting (for nausea from wegovy or other source). 30 tablet 0   OVER THE COUNTER MEDICATION Take 2 capsules by mouth daily. Juice Plus Berry Blend     oxybutynin (DITROPAN-XL) 5 MG 24 hr tablet Take 2 tablets (10 mg) daily at bedtime 60 tablet 5   No current facility-administered medications for this visit.     Past Medical History:  Diagnosis Date   Anxiety state 11/14/2007   Qualifier: Diagnosis of  By: Jonny Ruiz MD, Len Blalock    Breast cancer [facility])    R   CAROTID STENOSIS    Chronic venous insufficiency    CONTACT DERMATITIS    DUPUYTREN'S CONTRACTURE    Edema    pt  states had edema of feet and  legs has been on lasix per dr. to help   GLUCOSE INTOLERANCE    HLD (hyperlipidemia)    Hypothyroidism    Dr. Cherlyn Roberts in [place] end[facility]    MENOPAUSAL SYNDROME    Obesity    Osteoarthritis    bilat hips, severe   PVD (peripheral vascular disease) (HCC)    mild carotid 11/05   VITAMIN D DEFICIENCY     ROS:   All systems reviewed and negative except as noted in the HPI.   Past Surgical History:  Procedure Laterality Date   2D echo  11/29/2003   normal LV size. normal EF    BACK SURGERY  12/06/2016   L3-L5 laminectomies per Dr. Bettey Mare at [facility]   BREAST BIOPSY  05/11/2011   right breast   BREAST LUMPECTOMY     right breast   CARDIOVERSION N/A 04/28/2022   Procedure: CARDIOVERSION;  Surgeon: Tessa Lerner, DO;  Location: [facility] INVASIVE CV LAB;  Service: Cardiovascular;   Laterality: N/A;   CHOLECYSTECTOMY     FINGER SURGERY     HERNIA REPAIR     incisional hernia   INGUINAL HERNIA REPAIR     JOINT REPLACEMENT Bilateral    Dr. Despina Hick   normal caronary arteries     per pt. 2003   right rotator cuff surgery     SPINE SURGERY     TEE WITHOUT CARDIOVERSION N/A 04/28/2022   Procedure: TRANSESOPHAGEAL ECHOCARDIOGRAM;  Surgeon: Tessa Lerner, DO;  Location: [facility] INVASIVE CV LAB;  Service: Cardiovascular;  Laterality: N/A;   TONSILLECTOMY     TOTAL HIP ARTHROPLASTY  05/2008 and 10/2008   B hip - alusio     Family History  Problem Relation Age of Onset   Uterine cancer Mother 5   Cancer Mother    Ovarian cancer Mother    Heart disease Father        before age 35   Varicose Veins Father    Heart disease Brother        before age 38   Prostate cancer Maternal Uncle        diagnosed in his 19s   Prostate cancer Paternal Uncle        diagnosed in his 70s   Clotting disorder Maternal Grandmother    Pancreatic cancer Maternal Grandfather        diagnosed in his 17s   Breast cancer Daughter 24       BRCA negative (no BART)   Cancer Daughter    Diabetes Son    Colon cancer Cousin    Leukemia Cousin    Uterine cancer Cousin    Prostate cancer Cousin    Coronary artery disease Other        family hx - female 1st degree relative <60   Hypothyroidism Other        aunt    Anesthesia problems Neg Hx    Adrenal disorder Neg Hx    Esophageal cancer Neg Hx    Stomach cancer Neg Hx      Social History   Socioeconomic History   Marital status: Divorced    Spouse name: Not on file   Number of children: 5   Years of education: 1 year college   Highest education level: High school graduate  Occupational History   Not on file  Tobacco Use   Smoking status: Never    Passive exposure: Past   Smokeless tobacco: Never  Vaping Use   Vaping Use: Never used  Substance and Sexual Activity   Alcohol use: No    Alcohol/week: 0.0 standard drinks of alcohol    Drug use: No   Sexual activity: Not Currently  Other Topics Concern   Not on file  Social History Narrative   Divorced; works part time   5 children      Son "Rory" or daughter "Delorise Jackson" accompanies patient to appts   Social Determinants of Health   Financial Resource Strain: Low Risk  (05/19/2021)   Overall Financial Resource Strain (CARDIA)    Difficulty of Paying Living Expenses: Not hard at all  Food Insecurity: No Food Insecurity (03/30/2022)   Hunger Vital Sign    Worried About Running Out of Food in the Last Year: Never true    Ran Out of Food in the Last Year: Never true  Transportation Needs: No Transportation Needs (05/19/2021)   PRAPARE - Administrator, Civil Service (Medical): No    Lack of Transportation (Non-Medical): No  Physical Activity: Sufficiently Active (05/19/2021)   Exercise Vital Sign    Days of Exercise per Week: 7 days    Minutes of Exercise per Session: 30 min  Stress: No Stress Concern Present (05/19/2021)   [employer] of Occupational Health - Occupational Stress Questionnaire    Feeling of Stress : Not at all  Social Connections: Moderately Isolated (05/13/2020)   Social Connection and Isolation Panel [NHANES]    Frequency of Communication with Friends and Family: More than three times a week    Frequency of Social Gatherings with Friends and Family: More than three times a week    Attends Religious Services: More than 4 times per year    Active Member of [employer] or Organizations: No    Attends Banker Meetings: Never    Marital Status: Divorced  Catering manager Violence: Not At Risk (05/13/2020)   Humiliation, Afraid, Rape, and Kick questionnaire    Fear of Current or Ex-Partner: No    Emotionally Abused: No    Physically Abused: No    Sexually Abused: No     BP 118/78   Pulse 100   Ht 5\' 7"  (1.702 m)   Wt 194 lb (88 kg)   SpO2 99%   BMI 30.38 kg/m   Physical Exam:  Well appearing NAD HEENT: Unremarkable Neck:   No JVD, no thyromegally Lymphatics:  No adenopathy Back:  No CVA tenderness Lungs:  Clear HEART:  Regular rate rhythm, no murmurs, no rubs, no clicks Abd:  soft, positive bowel sounds, no organomegally, no rebound, no guarding Ext:  2 plus pulses, no edema, no cyanosis, no clubbing Skin:  No rashes no nodules Neuro:  CN II through XII intact, motor grossly intact  EKG - atrial fib with a RVR  Assess/Plan: Persistent atrial fib - I have reviewed the treatment options with the patient and recommended a trial of dofetilide.  IPF - she is not on oxygen. She will continue Ovef. HTN - she is on toprol. If we get her back to NSR, she may require something else for her bp. Sinus node dysfunction - she has previously been bradycardic. Hopefully she will not require a PPM.  Rachael Gowda Jolanta Cabeza,MD

## 2022-05-17 NOTE — Patient Instructions (Addendum)
Medication Instructions:  Your physician recommends that you continue on your current medications as directed. Please refer to the Current Medication list given to you today.  *If you need a refill on your cardiac medications before your next appointment, please call your pharmacy*  Lab Work: None ordered.  If you have labs (blood work) drawn today and your tests are completely normal, you will receive your results only by: MyChart Message (if you have MyChart) OR A paper copy in the mail If you have any lab test that is abnormal or we need to change your treatment, we will call you to review the results.  Testing/Procedures: None ordered.  Follow-Up: Dr. Lewayne Bunting has ordered a Tikosyn Admission;  See instructions below.     Your next appointment:   1 year(s)  The format for your next appointment:   In Person  Provider:   Lewayne Bunting, MD{or one of the following Advanced Practice Providers on your designated Care Team:   Francis Dowse, [place] Casimiro Needle "Mardelle Matte" [place], [place]  Dofetilide [facility]) Hospital Admission  Prior to day of admission:  Check with drug insurance company for cost of drug to ensure affordability --- Dofetilide 500 mcg twice a day.  GoodRx is an option if insurance copay is unaffordable. Patient assistance is also available.  A pharmacist will review all your medications for potential interactions with Tikosyn. If any medication changes are needed prior to admission we will be in touch with you.  If any new medications are started AFTER your admission date is set with Kennyth Arnold RN [phone]). Please notify our office immediately so your medication list can be updated and reviewed by our pharmacist again.  No Benadryl is allowed 3 days prior to admission.  Please ensure no missed doses of your anticoagulation (blood thinner) for 3 weeks prior to admission. If a dose is missed please notify our office immediately.  If you are on coumadin/warfarin we will  require 4 weekly therapeutic INR checks prior to admission.   On day of admission:  [facility] office visit will be scheduled on the morning of admission for preliminary labs and EKG.  Please take your morning medications and have a normal breakfast prior to arrival to the appointment.     Tikosyn initiation requires a 3 night/4 day hospital stay with constant telemetry monitoring. You will have an EKG after each dose of Tikosyn as well as daily lab draws.  You may bring personal belongings/clothing with you to the hospital. Please leave your suitcase in the car until you arrive in admissions.  If the drug does not convert you to normal rhythm a cardioversion will be performed after the 4th dose of Tikosyn. The cardioversion will be scheduled prior to admission - you may notice this on your appointments and receive a call from the hospital pharmacy to review your medications. You do not need to do anything to prepare for the cardioversion prior to admission.   Time of admission is dependent on bed availability in the hospital. In some instances, you will be sent home until bed is available. Rarely admission can be delayed to the following day if hospital census prevents available beds.  Questions please call our office at [phone]

## 2022-05-18 ENCOUNTER — Telehealth: Payer: Self-pay | Admitting: Pharmacist

## 2022-05-18 DIAGNOSIS — R531 Weakness: Secondary | ICD-10-CM | POA: Diagnosis not present

## 2022-05-18 DIAGNOSIS — R2689 Other abnormalities of gait and mobility: Secondary | ICD-10-CM | POA: Diagnosis not present

## 2022-05-18 DIAGNOSIS — R2681 Unsteadiness on feet: Secondary | ICD-10-CM | POA: Diagnosis not present

## 2022-05-18 NOTE — Telephone Encounter (Signed)
Medication list reviewed in anticipation of upcoming Tikosyn initiation. Patient will need to stop using Zofran due to potential for QTc prolongation. She also uses albuterol inhaler which can prolong QTc but is ok to continue.  Patient is anticoagulated on Eliquis 5mg  BID on the appropriate dose. Please ensure that patient has not missed any anticoagulation doses in the 3 weeks prior to Tikosyn initiation.   Patient will need to be counseled to avoid use of Benadryl while on Tikosyn and in the 2-3 days prior to Tikosyn initiation.

## 2022-05-21 ENCOUNTER — Ambulatory Visit: Payer: Medicare HMO | Admitting: Cardiology

## 2022-05-21 ENCOUNTER — Ambulatory Visit (INDEPENDENT_AMBULATORY_CARE_PROVIDER_SITE_OTHER): Payer: Medicare HMO

## 2022-05-21 VITALS — BP 110/60 | HR 62 | Temp 98.0°F | Ht 67.0 in | Wt 192.0 lb

## 2022-05-21 DIAGNOSIS — Z0000 Encounter for general adult medical examination without abnormal findings: Secondary | ICD-10-CM | POA: Diagnosis not present

## 2022-05-21 NOTE — Progress Notes (Signed)
Subjective:   Rachael [name] is a 89 y.o. female who presents for Medicare Annual (Subsequent) preventive examination.  Review of Systems     Cardiac Risk Factors include: advanced age (>75men, >57 women);hypertension     Objective:    Today's Vitals   05/21/22 1101  BP: 110/60  Pulse: 62  Temp: 98 F (36.7 C)  SpO2: 97%  Weight: 192 lb (87.1 kg)  Height: 5\' 7"  (1.702 m)   Body mass index is 30.07 kg/m.     05/21/2022   11:23 AM 03/01/2022   11:06 AM 01/10/2022    9:55 AM 06/10/2021    5:26 PM 05/20/2021    2:02 PM 05/19/2021    3:51 PM 05/13/2020    3:24 PM  Advanced Directives  Does Patient Have a Medical Advance Directive? Yes No Yes Yes Yes Yes Yes  Type of Estate agent of [place];Living will  Healthcare Power of [place];Living will Living will;Healthcare Power of Attorney Living will;Healthcare Power of [employer] Power of [place];Living will Healthcare Power of [place];Living will  Copy of Healthcare Power of Attorney in Chart? No - copy requested     No - copy requested No - copy requested    Current Medications (verified) Outpatient Encounter Medications as of 05/21/2022  Medication Sig   albuterol (VENTOLIN HFA) 108 (90 Base) MCG/ACT inhaler Inhale 2 puffs into the lungs every 4 (four) hours as needed for wheezing or shortness of breath.   allopurinol (ZYLOPRIM) 100 MG tablet Take 1 tablet (100 mg total) by mouth daily.   apixaban (ELIQUIS) 5 MG TABS tablet Take 1 tablet (5 mg total) by mouth 2 (two) times daily.   atorvastatin (LIPITOR) 40 MG tablet Take 40 mg by mouth every evening.   Calcium Carbonate (CALCIUM 600 PO) Take 600 mg by mouth 2 (two) times daily.   Cholecalciferol 50 MCG (2000 UT) CAPS Take 2,000 Units by mouth in the morning and at bedtime.   dapagliflozin propanediol (FARXIGA) 10 MG TABS tablet Take 1 tablet (10 mg total) by mouth daily before breakfast.   furosemide (LASIX) 20 MG tablet Take 1 tablet (20 mg  total) by mouth daily as needed for fluid. Take one tablet once a day   levothyroxine (SYNTHROID) 112 MCG tablet Take 1 tablet (112 mcg total) by mouth daily before breakfast.   LORazepam (ATIVAN) 0.5 MG tablet Take 0.5 mg by mouth as needed.   metoprolol succinate (TOPROL-XL) 25 MG 24 hr tablet Take 1 tablet (25 mg total) by mouth daily. Hold if systolic blood pressure (top number) less than 100 mmHg or pulse less than 60 bpm.   Nintedanib (OFEV) 150 MG CAPS Take 1 capsule (150 mg total) by mouth 2 (two) times daily.   ondansetron (ZOFRAN-ODT) 4 MG disintegrating tablet Take 1 tablet (4 mg total) by mouth every 8 (eight) hours as needed for nausea or vomiting (for nausea from wegovy or other source).   OVER THE COUNTER MEDICATION Take 2 capsules by mouth daily. Juice Plus Berry Blend   oxybutynin (DITROPAN-XL) 5 MG 24 hr tablet Take 2 tablets (10 mg) daily at bedtime   No facility-administered encounter medications on file as of 05/21/2022.    Allergies (verified) Shellfish allergy, Percocet [oxycodone-acetaminophen], Iodinated contrast media, and Iodine   History: Past Medical History:  Diagnosis Date   Anxiety state 11/14/2007   Qualifier: Diagnosis of  By: Jonny Ruiz MD, Len Blalock    Breast cancer [facility])    R   CAROTID  STENOSIS    Chronic venous insufficiency    CONTACT DERMATITIS    DUPUYTREN'S CONTRACTURE    Edema    pt  states had edema of feet and legs has been on lasix per dr. to help   GLUCOSE INTOLERANCE    HLD (hyperlipidemia)    Hypothyroidism    Dr. Cherlyn Roberts in [place] endo    MENOPAUSAL SYNDROME    Obesity    Osteoarthritis    bilat hips, severe   PVD (peripheral vascular disease) (HCC)    mild carotid 11/05   VITAMIN D DEFICIENCY    Past Surgical History:  Procedure Laterality Date   2D echo  11/29/2003   normal LV size. normal EF    BACK SURGERY  12/06/2016   L3-L5 laminectomies per Dr. Bettey Mare at [name]   BREAST BIOPSY  05/11/2011   right  breast   BREAST LUMPECTOMY     right breast   CARDIOVERSION N/A 04/28/2022   Procedure: CARDIOVERSION;  Surgeon: Tessa Lerner, DO;  Location: [facility] INVASIVE CV LAB;  Service: Cardiovascular;  Laterality: N/A;   CHOLECYSTECTOMY     FINGER SURGERY     HERNIA REPAIR     incisional hernia   INGUINAL HERNIA REPAIR     JOINT REPLACEMENT Bilateral    Dr. Despina Hick   normal caronary arteries     per pt. 2003   right rotator cuff surgery     SPINE SURGERY     TEE WITHOUT CARDIOVERSION N/A 04/28/2022   Procedure: TRANSESOPHAGEAL ECHOCARDIOGRAM;  Surgeon: Tessa Lerner, DO;  Location: [facility] INVASIVE CV LAB;  Service: Cardiovascular;  Laterality: N/A;   TONSILLECTOMY     TOTAL HIP ARTHROPLASTY  05/2008 and 10/2008   B hip - alusio   Family History  Problem Relation Age of Onset   Uterine cancer Mother 43   Cancer Mother    Ovarian cancer Mother    Heart disease Father        before age 12   Varicose Veins Father    Heart disease Brother        before age 41   Prostate cancer Maternal Uncle        diagnosed in his 104s   Prostate cancer Paternal Uncle        diagnosed in his 15s   Clotting disorder Maternal Grandmother    Pancreatic cancer Maternal Grandfather        diagnosed in his 37s   Breast cancer Daughter 45       BRCA negative (no BART)   Cancer Daughter    Diabetes Son    Colon cancer Cousin    Leukemia Cousin    Uterine cancer Cousin    Prostate cancer Cousin    Coronary artery disease Other        family hx - female 1st degree relative <60   Hypothyroidism Other        aunt    Anesthesia problems Neg Hx    Adrenal disorder Neg Hx    Esophageal cancer Neg Hx    Stomach cancer Neg Hx    Social History   Socioeconomic History   Marital status: Divorced    Spouse name: Not on file   Number of children: 5   Years of education: 1 year college   Highest education level: High school graduate  Occupational History   Not on file  Tobacco Use   Smoking status: Never     Passive exposure: Past  Smokeless tobacco: Never  Vaping Use   Vaping Use: Never used  Substance and Sexual Activity   Alcohol use: No    Alcohol/week: 0.0 standard drinks of alcohol   Drug use: No   Sexual activity: Not Currently  Other Topics Concern   Not on file  Social History Narrative   Divorced; works part time   5 children      Son "Rory" or daughter "Delorise Jackson" accompanies patient to appts   Social Determinants of Health   Financial Resource Strain: Low Risk  (05/21/2022)   Overall Financial Resource Strain (CARDIA)    Difficulty of Paying Living Expenses: Not hard at all  Food Insecurity: No Food Insecurity (05/21/2022)   Hunger Vital Sign    Worried About Running Out of Food in the Last Year: Never true    Ran Out of Food in the Last Year: Never true  Transportation Needs: No Transportation Needs (05/21/2022)   PRAPARE - Administrator, Civil Service (Medical): No    Lack of Transportation (Non-Medical): No  Physical Activity: Sufficiently Active (05/21/2022)   Exercise Vital Sign    Days of Exercise per Week: 6 days    Minutes of Exercise per Session: 30 min  Stress: No Stress Concern Present (05/21/2022)   [employer] of Occupational Health - Occupational Stress Questionnaire    Feeling of Stress : Not at all  Social Connections: Moderately Integrated (05/21/2022)   Social Connection and Isolation Panel [NHANES]    Frequency of Communication with Friends and Family: More than three times a week    Frequency of Social Gatherings with Friends and Family: More than three times a week    Attends Religious Services: More than 4 times per year    Active Member of [employer] or Organizations: Yes    Attends Engineer, structural: More than 4 times per year    Marital Status: Divorced    Tobacco Counseling Counseling given: Not Answered   Clinical Intake:  Pre-visit preparation completed: No  Pain : No/denies pain     BMI - recorded:  30.07 Nutritional Status: BMI > 30  Obese Nutritional Risks: None Diabetes: No  How often do you need to have someone help you when you read instructions, pamphlets, or other written materials from your doctor or pharmacy?: 1 - Never  Diabetic?  No  Interpreter Needed?: No  Information entered by :: Theresa Mulligan LPN   Activities of Daily Living    05/21/2022   11:18 AM 04/28/2022   10:33 AM  In your present state of health, do you have any difficulty performing the following activities:  Hearing? 0 0  Vision? 0 0  Difficulty concentrating or making decisions? 0 0  Walking or climbing stairs? 1 1  Comment Uses a walker and cane   Dressing or bathing? 0 0  Doing errands, shopping? 0   Preparing Food and eating ? N   Using the Toilet? N   In the past six months, have you accidently leaked urine? Y   Comment Wears pads. Followed by PCP   Do you have problems with loss of bowel control? N   Managing your Medications? N   Managing your Finances? N   Housekeeping or managing your Housekeeping? N     Patient Care Team: Nelwyn Salisbury, MD as PCP - General (Family Medicine) Ollen Gross, MD as Consulting Physician (Orthopedic Surgery) Darnell Level, MD as Consulting Physician (General Surgery) Marcelle Overlie, MD (Obstetrics and Gynecology)  Griselda Miner, MD (General Surgery) Magrinat, Valentino Hue, MD (Inactive) (Hematology and Oncology) Rossie Muskrat, MD as Referring Physician (Rheumatology) Sherrill Raring, [facility] (Pharmacist)  Indicate any recent Medical Services you may have received from other than [facility] providers in the past year (date may be approximate).     Assessment:   This is a routine wellness examination for Rosha.  Hearing/Vision screen Hearing Screening - Comments:: Denies hearing difficulties   Vision Screening - Comments::  - up to date with routine eye exams with  Dr Honor Loh  Dietary issues and exercise activities discussed: Exercise limited by:  orthopedic condition(s)   Goals Addressed               This Visit's Progress     Lose weight (pt-stated)        I want to get back to line dancing without walker.       Depression Screen    05/21/2022   11:01 AM 03/08/2022   10:40 AM 02/01/2022    4:33 PM 11/30/2021   10:29 AM 11/20/2021   10:33 AM 11/06/2021    3:36 PM 10/01/2021   11:25 AM  PHQ 2/9 Scores  PHQ - 2 Score 0 0 0 0 0 0 0  PHQ- 9 Score  1 1 4 2 4  0    Fall Risk    05/21/2022   11:20 AM 03/08/2022   10:39 AM 02/01/2022    4:33 PM 11/30/2021   10:28 AM 11/20/2021   10:32 AM  Fall Risk   Falls in the past year? 1 1 0 1 1  Number falls in past yr: 0 0 0 0 0  Injury with Fall? 1 1 0 1 1  Comment Fx coccyx. Followed by medical attention   Tail Bone /head injury Tailbone soreness  Risk for fall due to : Orthopedic patient History of fall(s);Impaired balance/gait No Fall Risks Impaired balance/gait No Fall Risks;Impaired balance/gait  Follow up Falls prevention discussed Falls evaluation completed Falls evaluation completed Falls evaluation completed;Education provided;Falls prevention discussed;Follow up appointment Falls evaluation completed;Education provided;Falls prevention discussed;Follow up appointment    FALL RISK PREVENTION PERTAINING TO THE HOME:  Any stairs in or around the home? Yes  If so, are there any without handrails? No  Home free of loose throw rugs in walkways, pet beds, electrical cords, etc? Yes  Adequate lighting in your home to reduce risk of falls? Yes   ASSISTIVE DEVICES UTILIZED TO PREVENT FALLS:  Life alert? No  Use of a cane, walker or w/c? Yes  Grab bars in the bathroom? Yes  Shower chair or bench in shower? Yes  Elevated toilet seat or a handicapped toilet? Yes   TIMED UP AND GO:  Was the test performed? Yes .  Length of time to ambulate 10 feet: 10 sec.   Gait slow and steady with assistive device  Cognitive Function:        05/21/2022   11:23 AM 05/19/2021     3:53 PM 05/13/2020    3:28 PM  6CIT Screen  What Year? 0 points 0 points 0 points  What month? 0 points 0 points 0 points  What time? 0 points 0 points   Count back from 20 0 points 0 points   Months in reverse 0 points 0 points   Repeat phrase 0 points 0 points   Total Score 0 points 0 points     Immunizations Immunization History  Administered Date(s) Administered   PPD Test  03/20/2015   Td 02/11/1988, 11/14/2007, 02/22/2014   Zoster Recombinat (Shingrix) 05/10/2017, 08/23/2017    TDAP status: Up to date  Flu Vaccine status: Up to date  Qualifies for Shingles Vaccine? Yes   Zostavax completed Yes   Shingrix Completed?: Yes  Screening Tests Health Maintenance  Topic Date Due   Pneumonia Vaccine 59+ Years old (1 of 2 - PCV) 10/02/2022 (Originally 12/10/1939)   INFLUENZA VACCINE  08/12/2022   Medicare Annual Wellness (AWV)  05/21/2023   DTaP/Tdap/Td (4 - Tdap) 02/23/2024   DEXA SCAN  Completed   Zoster Vaccines- Shingrix  Completed   HPV VACCINES  Aged Out   COVID-19 Vaccine  Discontinued    Health Maintenance  There are no preventive care reminders to display for this patient.   Colorectal cancer screening: No longer required.   Mammogram status: No longer required due to Age.  Bone Density status: Ordered 2/6/. Pt provided with contact info and advised to call to schedule appt.  Lung Cancer Screening: (Low Dose CT Chest recommended if Age 66-80 years, 30 pack-year currently smoking OR have quit w/in 15years.) does not qualify.     Additional Screening:  Hepatitis C Screening: does not qualify; Completed   Vision Screening: Recommended annual ophthalmology exams for early detection of glaucoma and other disorders of the eye. Is the patient up to date with their annual eye exam?  Yes  Who is the provider or what is the name of the office in which the patient attends annual eye exams? Dr Honor Loh If pt is not established with a provider, would they like to be  referred to a provider to establish care? No .   Dental Screening: Recommended annual dental exams for proper oral hygiene  Community Resource Referral / Chronic Care Management:  CRR required this visit?  No   CCM required this visit?  No      Plan:     I have personally reviewed and noted the following in the patient's chart:   Medical and social history Use of alcohol, tobacco or illicit drugs  Current medications and supplements including opioid prescriptions. Patient is not currently taking opioid prescriptions. Functional ability and status Nutritional status Physical activity Advanced directives List of other physicians Hospitalizations, surgeries, and ER visits in previous 12 months Vitals Screenings to include cognitive, depression, and falls Referrals and appointments  In addition, I have reviewed and discussed with patient certain preventive protocols, quality metrics, and best practice recommendations. A written personalized care plan for preventive services as well as general preventive health recommendations were provided to patient.     Tillie Rung, LPN   5/62/1308   Nurse Notes:   None

## 2022-05-21 NOTE — Patient Instructions (Addendum)
Rachael [name] , Thank you for taking time to come for your Medicare Wellness Visit. I appreciate your ongoing commitment to your health goals. Please review the following plan we discussed and let me know if I can assist you in the future.   These are the goals we discussed:  Goals       Exercise 150 min/wk Moderate Activity      To be able to walk again Ca next month      Lose weight (pt-stated)      I want to get back to line dancing without walker.      Patient Stated      Get back to line dancing       Patient Stated      05/19/2021, trip to [name]        This is a list of the screening recommended for you and due dates:  Health Maintenance  Topic Date Due   Pneumonia Vaccine (1 of 2 - PCV) 10/02/2022*   Flu Shot  08/12/2022   Medicare Annual Wellness Visit  05/21/2023   DTaP/Tdap/Td vaccine (4 - Tdap) 02/23/2024   DEXA scan (bone density measurement)  Completed   Zoster (Shingles) Vaccine  Completed   HPV Vaccine  Aged Out   COVID-19 Vaccine  Discontinued  *Topic was postponed. The date shown is not the original due date.    Advanced directives: Please bring a copy of your health care power of attorney and living will to the office to be added to your chart at your convenience.   Conditions/risks identified: None  Next appointment: Follow up in one year for your annual wellness visit    Preventive Care 65 Years and Older, Female Preventive care refers to lifestyle choices and visits with your health care provider that can promote health and wellness. What does preventive care include? A yearly physical exam. This is also called an annual well check. Dental exams once or twice a year. Routine eye exams. Ask your health care provider how often you should have your eyes checked. Personal lifestyle choices, including: Daily care of your teeth and gums. Regular physical activity. Eating a healthy diet. Avoiding tobacco and drug use. Limiting alcohol use. Practicing  safe sex. Taking low-dose aspirin every day. Taking vitamin and mineral supplements as recommended by your health care provider. What happens during an annual well check? The services and screenings done by your health care provider during your annual well check will depend on your age, overall health, lifestyle risk factors, and family history of disease. Counseling  Your health care provider may ask you questions about your: Alcohol use. Tobacco use. Drug use. Emotional well-being. Home and relationship well-being. Sexual activity. Eating habits. History of falls. Memory and ability to understand (cognition). Work and work Astronomer. Reproductive health. Screening  You may have the following tests or measurements: Height, weight, and BMI. Blood pressure. Lipid and cholesterol levels. These may be checked every 5 years, or more frequently if you are over 51 years old. Skin check. Lung cancer screening. You may have this screening every year starting at age 49 if you have a 30-pack-year history of smoking and currently smoke or have quit within the past 15 years. Fecal occult blood test (FOBT) of the stool. You may have this test every year starting at age 42. Flexible sigmoidoscopy or colonoscopy. You may have a sigmoidoscopy every 5 years or a colonoscopy every 10 years starting at age 33. Hepatitis C blood test. Hepatitis B  blood test. Sexually transmitted disease (STD) testing. Diabetes screening. This is done by checking your blood sugar (glucose) after you have not eaten for a while (fasting). You may have this done every 1-3 years. Bone density scan. This is done to screen for osteoporosis. You may have this done starting at age 58. Mammogram. This may be done every 1-2 years. Talk to your health care provider about how often you should have regular mammograms. Talk with your health care provider about your test results, treatment options, and if necessary, the need for more  tests. Vaccines  Your health care provider may recommend certain vaccines, such as: Influenza vaccine. This is recommended every year. Tetanus, diphtheria, and acellular pertussis (Tdap, Td) vaccine. You may need a Td booster every 10 years. Zoster vaccine. You may need this after age 73. Pneumococcal 13-valent conjugate (PCV13) vaccine. One dose is recommended after age 71. Pneumococcal polysaccharide (PPSV23) vaccine. One dose is recommended after age 40. Talk to your health care provider about which screenings and vaccines you need and how often you need them. This information is not intended to replace advice given to you by your health care provider. Make sure you discuss any questions you have with your health care provider. Document Released: 01/24/2015 Document Revised: 09/17/2015 Document Reviewed: 10/29/2014 Elsevier Interactive Patient Education  2017 [employer].  Fall Prevention in the Home Falls can cause injuries. They can happen to people of all ages. There are many things you can do to make your home safe and to help prevent falls. What can I do on the outside of my home? Regularly fix the edges of walkways and driveways and fix any cracks. Remove anything that might make you trip as you walk through a door, such as a raised step or threshold. Trim any bushes or trees on the path to your home. Use bright outdoor lighting. Clear any walking paths of anything that might make someone trip, such as rocks or tools. Regularly check to see if handrails are loose or broken. Make sure that both sides of any steps have handrails. Any raised decks and porches should have guardrails on the edges. Have any leaves, snow, or ice cleared regularly. Use sand or salt on walking paths during winter. Clean up any spills in your garage right away. This includes oil or grease spills. What can I do in the bathroom? Use night lights. Install grab bars by the toilet and in the tub and shower.  Do not use towel bars as grab bars. Use non-skid mats or decals in the tub or shower. If you need to sit down in the shower, use a plastic, non-slip stool. Keep the floor dry. Clean up any water that spills on the floor as soon as it happens. Remove soap buildup in the tub or shower regularly. Attach bath mats securely with double-sided non-slip rug tape. Do not have throw rugs and other things on the floor that can make you trip. What can I do in the bedroom? Use night lights. Make sure that you have a light by your bed that is easy to reach. Do not use any sheets or blankets that are too big for your bed. They should not hang down onto the floor. Have a firm chair that has side arms. You can use this for support while you get dressed. Do not have throw rugs and other things on the floor that can make you trip. What can I do in the kitchen? Clean up any spills  right away. Avoid walking on wet floors. Keep items that you use a lot in easy-to-reach places. If you need to reach something above you, use a strong step stool that has a grab bar. Keep electrical cords out of the way. Do not use floor polish or wax that makes floors slippery. If you must use wax, use non-skid floor wax. Do not have throw rugs and other things on the floor that can make you trip. What can I do with my stairs? Do not leave any items on the stairs. Make sure that there are handrails on both sides of the stairs and use them. Fix handrails that are broken or loose. Make sure that handrails are as long as the stairways. Check any carpeting to make sure that it is firmly attached to the stairs. Fix any carpet that is loose or worn. Avoid having throw rugs at the top or bottom of the stairs. If you do have throw rugs, attach them to the floor with carpet tape. Make sure that you have a light switch at the top of the stairs and the bottom of the stairs. If you do not have them, ask someone to add them for you. What else  can I do to help prevent falls? Wear shoes that: Do not have high heels. Have rubber bottoms. Are comfortable and fit you well. Are closed at the toe. Do not wear sandals. If you use a stepladder: Make sure that it is fully opened. Do not climb a closed stepladder. Make sure that both sides of the stepladder are locked into place. Ask someone to hold it for you, if possible. Clearly mark and make sure that you can see: Any grab bars or handrails. First and last steps. Where the edge of each step is. Use tools that help you move around (mobility aids) if they are needed. These include: Canes. Walkers. Scooters. Crutches. Turn on the lights when you go into a dark area. Replace any light bulbs as soon as they burn out. Set up your furniture so you have a clear path. Avoid moving your furniture around. If any of your floors are uneven, fix them. If there are any pets around you, be aware of where they are. Review your medicines with your doctor. Some medicines can make you feel dizzy. This can increase your chance of falling. Ask your doctor what other things that you can do to help prevent falls. This information is not intended to replace advice given to you by your health care provider. Make sure you discuss any questions you have with your health care provider. Document Released: 10/24/2008 Document Revised: 06/05/2015 Document Reviewed: 02/01/2014 Elsevier Interactive Patient Education  2017 [employer].

## 2022-05-21 NOTE — Addendum Note (Signed)
Addended by: Shona Simpson on: 05/21/2022 01:56 PM   Modules accepted: Orders

## 2022-05-21 NOTE — Telephone Encounter (Signed)
Pt notified to stop zofran. Pt states she does not take.

## 2022-05-21 NOTE — Telephone Encounter (Signed)
Son is calling to follow-up on patient Tikosyn treatment.

## 2022-05-24 ENCOUNTER — Telehealth: Payer: Self-pay | Admitting: Internal Medicine

## 2022-05-24 DIAGNOSIS — H02413 Mechanical ptosis of bilateral eyelids: Secondary | ICD-10-CM | POA: Diagnosis not present

## 2022-05-24 DIAGNOSIS — H53483 Generalized contraction of visual field, bilateral: Secondary | ICD-10-CM | POA: Diagnosis not present

## 2022-05-24 DIAGNOSIS — H02831 Dermatochalasis of right upper eyelid: Secondary | ICD-10-CM | POA: Diagnosis not present

## 2022-05-24 DIAGNOSIS — H02832 Dermatochalasis of right lower eyelid: Secondary | ICD-10-CM | POA: Diagnosis not present

## 2022-05-24 DIAGNOSIS — H02835 Dermatochalasis of left lower eyelid: Secondary | ICD-10-CM | POA: Diagnosis not present

## 2022-05-24 DIAGNOSIS — H0279 Other degenerative disorders of eyelid and periocular area: Secondary | ICD-10-CM | POA: Diagnosis not present

## 2022-05-24 DIAGNOSIS — H02834 Dermatochalasis of left upper eyelid: Secondary | ICD-10-CM | POA: Diagnosis not present

## 2022-05-24 DIAGNOSIS — H02423 Myogenic ptosis of bilateral eyelids: Secondary | ICD-10-CM | POA: Diagnosis not present

## 2022-05-24 DIAGNOSIS — H57813 Brow ptosis, bilateral: Secondary | ICD-10-CM | POA: Diagnosis not present

## 2022-05-24 NOTE — Telephone Encounter (Signed)
Call received in [facility] triage from Son regarding Tikosyn admission labs.   Pt son wanted to know if labs would be drawn the morning of the Tikosyn admission, and where to report?  Pt son advised Pt will report to [facility] for labs and EKG prior to admission.  Copy of instructions were MyChart messaged to Pt and son for review.  Pt son advised to any other questions to reach out to [facility].   Pt son verbalized understanding, and AVS instructions copied / forwarded through MyChart.

## 2022-05-24 NOTE — Telephone Encounter (Signed)
Patient son is calling to find out if his mother needs to come in for lab prior to her going to the hospital for Tikosyn.  Please advise.

## 2022-05-25 ENCOUNTER — Ambulatory Visit (INDEPENDENT_AMBULATORY_CARE_PROVIDER_SITE_OTHER): Payer: Medicare HMO | Admitting: Family Medicine

## 2022-05-25 ENCOUNTER — Encounter: Payer: Self-pay | Admitting: Family Medicine

## 2022-05-25 ENCOUNTER — Other Ambulatory Visit: Payer: Self-pay

## 2022-05-25 ENCOUNTER — Telehealth: Payer: Self-pay | Admitting: Family Medicine

## 2022-05-25 VITALS — BP 102/62 | HR 59 | Temp 98.6°F | Wt 184.0 lb

## 2022-05-25 DIAGNOSIS — R040 Epistaxis: Secondary | ICD-10-CM | POA: Diagnosis not present

## 2022-05-25 DIAGNOSIS — R197 Diarrhea, unspecified: Secondary | ICD-10-CM

## 2022-05-25 NOTE — Telephone Encounter (Signed)
Pt was given 5 boxes of Eliquis 5 mg. This was documented on the book in the sample closet

## 2022-05-25 NOTE — Progress Notes (Signed)
   Subjective:    Patient ID: Rachael [name], female    DOB: 23-Jul-1933, 89 y.o.   MRN: [id number]  HPI Here for 2 episodes of watery diarrhea in the past 5 days. On both occasions she lost control of her bowels. The first one was 5 days ago, then 2 days later she passed a normal stool. Then last night she had another loose one. No fever or nausea or cramps. No recent change in medications. Today she feels fine. She also had a nosebleed from the right nostril yesterday. None today. Of course she takes Eliquis.    Review of Systems  Constitutional: Negative.   HENT:  Positive for nosebleeds. Negative for congestion, postnasal drip, sinus pressure and sore throat.   Eyes: Negative.   Respiratory: Negative.    Cardiovascular: Negative.   Gastrointestinal:  Positive for diarrhea. Negative for abdominal distention, abdominal pain, blood in stool, constipation, nausea, rectal pain and vomiting.       Objective:   Physical Exam Constitutional:      Appearance: Normal appearance.     Comments: Walks with her walker   HENT:     Nose: Nose normal.     Mouth/Throat:     Pharynx: Oropharynx is clear.  Eyes:     Conjunctiva/sclera: Conjunctivae normal.  Cardiovascular:     Rate and Rhythm: Normal rate. Rhythm irregular.     Pulses: Normal pulses.     Heart sounds: Normal heart sounds.  Pulmonary:     Effort: Pulmonary effort is normal.     Breath sounds: Normal breath sounds.  Abdominal:     General: Abdomen is flat. Bowel sounds are normal. There is no distension.     Palpations: Abdomen is soft. There is no mass.     Tenderness: There is no abdominal tenderness. There is no guarding or rebound.     Hernia: No hernia is present.  Lymphadenopathy:     Cervical: No cervical adenopathy.  Neurological:     Mental Status: She is alert.           Assessment & Plan:  She has had one anterior nosebleed, which was likely from her Eliquis. I suggested she use saline nasal sprays. The  etiology of the diarrhea is unclear, but she has no signs of an infection. She will eat bland foods for the next week or so. Recheck if this comes back.  Gershon Crane, MD

## 2022-05-25 NOTE — Telephone Encounter (Signed)
Pt's son called in today, Katha Cabal. He states that pt has an appointment today with Dr. Clent Ridges and he would like the clinical team to encourage pt to get weighed today. He states she is on medication that can be affected by her weight change, however usually pt refuses to be weighed. He would like to know if clinical team could make sure pt is weighed today during her appt.

## 2022-05-26 ENCOUNTER — Telehealth: Payer: Self-pay | Admitting: Family Medicine

## 2022-05-26 DIAGNOSIS — M2041 Other hammer toe(s) (acquired), right foot: Secondary | ICD-10-CM | POA: Diagnosis not present

## 2022-05-26 NOTE — Telephone Encounter (Signed)
Pt calling to report she has had 4 episodes of diarrhea today. Said she was advised to make provider aware

## 2022-05-27 NOTE — Telephone Encounter (Addendum)
Requesting to speak with someone regarding her diarrhea, she is still having episodes today. Says she is worried

## 2022-05-27 NOTE — Telephone Encounter (Signed)
Noted  

## 2022-05-29 ENCOUNTER — Other Ambulatory Visit (HOSPITAL_COMMUNITY): Payer: Self-pay

## 2022-05-31 NOTE — Telephone Encounter (Signed)
Called pt left a detailed message advising to call the office back if still having diarrhea

## 2022-06-01 ENCOUNTER — Ambulatory Visit (HOSPITAL_COMMUNITY)
Admission: RE | Admit: 2022-06-01 | Discharge: 2022-06-01 | Disposition: A | Discharge status: Home / Self Care | Payer: Medicare HMO | Source: Ambulatory Visit | Attending: Internal Medicine | Admitting: Internal Medicine

## 2022-06-01 ENCOUNTER — Other Ambulatory Visit (HOSPITAL_COMMUNITY): Payer: Self-pay

## 2022-06-01 ENCOUNTER — Inpatient Hospital Stay (HOSPITAL_COMMUNITY)
Admission: RE | Admit: 2022-06-01 | Discharge: 2022-06-05 | DRG: 309 | Disposition: A | Discharge status: Home / Self Care | Payer: Medicare HMO | Attending: Internal Medicine | Admitting: Internal Medicine

## 2022-06-01 ENCOUNTER — Telehealth (HOSPITAL_COMMUNITY): Payer: Self-pay | Admitting: Pharmacy Technician

## 2022-06-01 VITALS — BP 128/100 | HR 77 | Ht 67.0 in | Wt 195.8 lb

## 2022-06-01 DIAGNOSIS — Z8249 Family history of ischemic heart disease and other diseases of the circulatory system: Secondary | ICD-10-CM

## 2022-06-01 DIAGNOSIS — I509 Heart failure, unspecified: Secondary | ICD-10-CM | POA: Diagnosis not present

## 2022-06-01 DIAGNOSIS — Z885 Allergy status to narcotic agent status: Secondary | ICD-10-CM | POA: Diagnosis not present

## 2022-06-01 DIAGNOSIS — J8410 Pulmonary fibrosis, unspecified: Secondary | ICD-10-CM | POA: Diagnosis present

## 2022-06-01 DIAGNOSIS — E039 Hypothyroidism, unspecified: Secondary | ICD-10-CM | POA: Diagnosis present

## 2022-06-01 DIAGNOSIS — G4733 Obstructive sleep apnea (adult) (pediatric): Secondary | ICD-10-CM | POA: Diagnosis present

## 2022-06-01 DIAGNOSIS — Z806 Family history of leukemia: Secondary | ICD-10-CM | POA: Diagnosis not present

## 2022-06-01 DIAGNOSIS — I4719 Other supraventricular tachycardia: Secondary | ICD-10-CM | POA: Diagnosis not present

## 2022-06-01 DIAGNOSIS — I5032 Chronic diastolic (congestive) heart failure: Secondary | ICD-10-CM | POA: Diagnosis not present

## 2022-06-01 DIAGNOSIS — I110 Hypertensive heart disease with heart failure: Secondary | ICD-10-CM | POA: Diagnosis present

## 2022-06-01 DIAGNOSIS — Z833 Family history of diabetes mellitus: Secondary | ICD-10-CM

## 2022-06-01 DIAGNOSIS — Z803 Family history of malignant neoplasm of breast: Secondary | ICD-10-CM

## 2022-06-01 DIAGNOSIS — Z8041 Family history of malignant neoplasm of ovary: Secondary | ICD-10-CM

## 2022-06-01 DIAGNOSIS — Z8049 Family history of malignant neoplasm of other genital organs: Secondary | ICD-10-CM

## 2022-06-01 DIAGNOSIS — I739 Peripheral vascular disease, unspecified: Secondary | ICD-10-CM | POA: Diagnosis not present

## 2022-06-01 DIAGNOSIS — E669 Obesity, unspecified: Secondary | ICD-10-CM | POA: Diagnosis present

## 2022-06-01 DIAGNOSIS — I4819 Other persistent atrial fibrillation: Principal | ICD-10-CM | POA: Diagnosis present

## 2022-06-01 DIAGNOSIS — Z832 Family history of diseases of the blood and blood-forming organs and certain disorders involving the immune mechanism: Secondary | ICD-10-CM

## 2022-06-01 DIAGNOSIS — Z91013 Allergy to seafood: Secondary | ICD-10-CM

## 2022-06-01 DIAGNOSIS — Z79890 Hormone replacement therapy (postmenopausal): Secondary | ICD-10-CM | POA: Diagnosis not present

## 2022-06-01 DIAGNOSIS — I493 Ventricular premature depolarization: Secondary | ICD-10-CM | POA: Diagnosis present

## 2022-06-01 DIAGNOSIS — E785 Hyperlipidemia, unspecified: Secondary | ICD-10-CM | POA: Diagnosis not present

## 2022-06-01 DIAGNOSIS — K5900 Constipation, unspecified: Secondary | ICD-10-CM | POA: Diagnosis present

## 2022-06-01 DIAGNOSIS — Z7901 Long term (current) use of anticoagulants: Secondary | ICD-10-CM

## 2022-06-01 DIAGNOSIS — Z6830 Body mass index (BMI) 30.0-30.9, adult: Secondary | ICD-10-CM

## 2022-06-01 DIAGNOSIS — Z91041 Radiographic dye allergy status: Secondary | ICD-10-CM

## 2022-06-01 DIAGNOSIS — D6869 Other thrombophilia: Secondary | ICD-10-CM | POA: Diagnosis present

## 2022-06-01 DIAGNOSIS — F411 Generalized anxiety disorder: Secondary | ICD-10-CM | POA: Diagnosis present

## 2022-06-01 DIAGNOSIS — Z800 Family history of malignant neoplasm of digestive organs: Secondary | ICD-10-CM | POA: Diagnosis not present

## 2022-06-01 DIAGNOSIS — R001 Bradycardia, unspecified: Secondary | ICD-10-CM | POA: Diagnosis present

## 2022-06-01 DIAGNOSIS — I4891 Unspecified atrial fibrillation: Secondary | ICD-10-CM | POA: Diagnosis not present

## 2022-06-01 DIAGNOSIS — Z79899 Other long term (current) drug therapy: Secondary | ICD-10-CM | POA: Diagnosis not present

## 2022-06-01 DIAGNOSIS — Z9049 Acquired absence of other specified parts of digestive tract: Secondary | ICD-10-CM

## 2022-06-01 DIAGNOSIS — D638 Anemia in other chronic diseases classified elsewhere: Secondary | ICD-10-CM | POA: Diagnosis not present

## 2022-06-01 DIAGNOSIS — Z96643 Presence of artificial hip joint, bilateral: Secondary | ICD-10-CM | POA: Diagnosis present

## 2022-06-01 DIAGNOSIS — J449 Chronic obstructive pulmonary disease, unspecified: Secondary | ICD-10-CM | POA: Diagnosis not present

## 2022-06-01 DIAGNOSIS — Z853 Personal history of malignant neoplasm of breast: Secondary | ICD-10-CM

## 2022-06-01 LAB — BASIC METABOLIC PANEL
Anion gap: 11 (ref 5–15)
BUN: 22 mg/dL (ref 8–23)
CO2: 24 mmol/L (ref 22–32)
Calcium: 9.7 mg/dL (ref 8.9–10.3)
Chloride: 100 mmol/L (ref 98–111)
Creatinine, Ser: 1.23 mg/dL — ABNORMAL HIGH (ref 0.44–1.00)
GFR, Estimated: 43 mL/min — ABNORMAL LOW (ref >60)
Glucose, Bld: 116 mg/dL — ABNORMAL HIGH (ref 70–99)
Potassium: 4.3 mmol/L (ref 3.5–5.1)
Sodium: 135 mmol/L (ref 135–145)

## 2022-06-01 LAB — MAGNESIUM
Magnesium: 1.6 mg/dL — ABNORMAL LOW (ref 1.7–2.4)
Magnesium: 2.8 mg/dL — ABNORMAL HIGH (ref 1.7–2.4)

## 2022-06-01 MED ORDER — LEVOTHYROXINE SODIUM 112 MCG PO TABS
112.0000 ug | ORAL_TABLET | Freq: Every day | ORAL | Status: DC
  Administered 2022-06-02 – 2022-06-05 (×4): 112 ug via ORAL
  Filled 2022-06-01 (×4): qty 1

## 2022-06-01 MED ORDER — SODIUM CHLORIDE 0.9% FLUSH
3.0000 mL | Freq: Two times a day (BID) | INTRAVENOUS | Status: DC
  Administered 2022-06-01 – 2022-06-05 (×8): 3 mL via INTRAVENOUS

## 2022-06-01 MED ORDER — NINTEDANIB ESYLATE 150 MG PO CAPS
150.0000 mg | ORAL_CAPSULE | Freq: Two times a day (BID) | ORAL | Status: DC
  Administered 2022-06-01 – 2022-06-05 (×8): 150 mg via ORAL
  Filled 2022-06-01 (×10): qty 1

## 2022-06-01 MED ORDER — DOFETILIDE 250 MCG PO CAPS
250.0000 ug | ORAL_CAPSULE | Freq: Two times a day (BID) | ORAL | Status: DC
  Administered 2022-06-01 – 2022-06-03 (×4): 250 ug via ORAL
  Filled 2022-06-01 (×4): qty 1

## 2022-06-01 MED ORDER — DAPAGLIFLOZIN PROPANEDIOL 10 MG PO TABS
10.0000 mg | ORAL_TABLET | Freq: Every day | ORAL | Status: DC
  Administered 2022-06-02 – 2022-06-05 (×4): 10 mg via ORAL
  Filled 2022-06-01 (×5): qty 1

## 2022-06-01 MED ORDER — ATORVASTATIN CALCIUM 40 MG PO TABS
40.0000 mg | ORAL_TABLET | Freq: Every evening | ORAL | Status: DC
  Administered 2022-06-01 – 2022-06-04 (×4): 40 mg via ORAL
  Filled 2022-06-01 (×4): qty 1

## 2022-06-01 MED ORDER — FUROSEMIDE 20 MG PO TABS: 20.0000 mg | ORAL_TABLET | Freq: Every day | ORAL | Status: DC | PRN

## 2022-06-01 MED ORDER — ALLOPURINOL 100 MG PO TABS
100.0000 mg | ORAL_TABLET | Freq: Every day | ORAL | Status: DC
  Administered 2022-06-02 – 2022-06-05 (×4): 100 mg via ORAL
  Filled 2022-06-01 (×4): qty 1

## 2022-06-01 MED ORDER — OXYBUTYNIN CHLORIDE ER 10 MG PO TB24
10.0000 mg | ORAL_TABLET | Freq: Every day | ORAL | Status: DC
  Administered 2022-06-01 – 2022-06-04 (×4): 10 mg via ORAL
  Filled 2022-06-01 (×4): qty 1

## 2022-06-01 MED ORDER — MAGNESIUM SULFATE 4 GM/100ML IV SOLN
4.0000 g | Freq: Once | INTRAVENOUS | Status: AC
  Administered 2022-06-01: 4 g via INTRAVENOUS
  Filled 2022-06-01: qty 100

## 2022-06-01 MED ORDER — ALBUTEROL SULFATE (2.5 MG/3ML) 0.083% IN NEBU: 3.0000 mL | INHALATION_SOLUTION | RESPIRATORY_TRACT | Status: DC | PRN

## 2022-06-01 MED ORDER — SODIUM CHLORIDE 0.9 % IV SOLN: 250.0000 mL | INTRAVENOUS | Status: DC | PRN

## 2022-06-01 MED ORDER — DOFETILIDE 250 MCG PO CAPS
250.0000 ug | ORAL_CAPSULE | Freq: Two times a day (BID) | ORAL | Status: DC
Start: 2022-06-01 — End: 2022-06-01

## 2022-06-01 MED ORDER — APIXABAN 5 MG PO TABS
5.0000 mg | ORAL_TABLET | Freq: Two times a day (BID) | ORAL | Status: DC
  Administered 2022-06-01 – 2022-06-05 (×8): 5 mg via ORAL
  Filled 2022-06-01 (×8): qty 1

## 2022-06-01 MED ORDER — SODIUM CHLORIDE 0.9% FLUSH: 3.0000 mL | INTRAVENOUS | Status: DC | PRN

## 2022-06-01 NOTE — Progress Notes (Signed)
Primary Care Physician: Nelwyn Salisbury, MD Primary Cardiologist: Dr. Odis Hollingshead Primary Electrophysiologist: Dr. Ladona Ridgel Referring Physician: Dr. Markham Jordan is a 89 y.o. female with a history of SVT/AT, pulmonary fibrosis, sleep apnea, aortic atherosclerosis, HTN, HFpEF, carotid stenosis, HLD, hypothyroidism, sinus node dysfunction, and persistent atrial fibrillation who presents for consultation in the [facility]. S/p successful TEE/DCCV on 04/28/22 with ERAF. The patient was initially diagnosed with atrial fibrillation 02/2022. Patient is on Eliquis 5 mg BID for a CHADS2VASC score of 6.  On evaluation today, she is currently in rate controlled Afib. She is here for Tikosyn admission. Pharmacist review of medication list recommended patient to stop taking Zofran but patient stated she was not taking this medication. No other contraindications noted. She is not taking benadryl. She has not taken new medications prescribed since pharmacist review. She has taken her morning medications as directed. She has not been taking her Toprol over the past few days per son, due to BP 110s/60s.   She is compliant with anticoagulation and has not missed any doses. She has no bleeding concerns.  Today, she denies symptoms of palpitations, chest pain, shortness of breath, orthopnea, PND, lower extremity edema, dizziness, presyncope, syncope, snoring, daytime somnolence, bleeding, or neurologic sequela. The patient is tolerating medications without difficulties and is otherwise without complaint today.   she has a BMI of Body mass index is 30.67 kg/m.Marland Kitchen Filed Weights   06/01/22 1103  Weight: 88.8 kg    Family History  Problem Relation Age of Onset   Uterine cancer Mother 81   Cancer Mother    Ovarian cancer Mother    Heart disease Father        before age 15   Varicose Veins Father    Heart disease Brother        before age 53   Prostate cancer Maternal Uncle         diagnosed in his 3s   Prostate cancer Paternal Uncle        diagnosed in his 46s   Clotting disorder Maternal Grandmother    Pancreatic cancer Maternal Grandfather        diagnosed in his 41s   Breast cancer Daughter 16       BRCA negative (no BART)   Cancer Daughter    Diabetes Son    Colon cancer Cousin    Leukemia Cousin    Uterine cancer Cousin    Prostate cancer Cousin    Coronary artery disease Other        family hx - female 1st degree relative <60   Hypothyroidism Other        aunt    Anesthesia problems Neg Hx    Adrenal disorder Neg Hx    Esophageal cancer Neg Hx    Stomach cancer Neg Hx      Atrial Fibrillation Management history:  Previous antiarrhythmic drugs: Flecainide (discontinued due to bradycardia) Previous cardioversions: 04/28/22 Previous ablations: None Anticoagulation history: Eliquis 5 mg BID   Past Medical History:  Diagnosis Date   Anxiety state 11/14/2007   Qualifier: Diagnosis of  By: Jonny Ruiz MD, Len Blalock    Breast cancer [facility])    R   CAROTID STENOSIS    Chronic venous insufficiency    CONTACT DERMATITIS    DUPUYTREN'S CONTRACTURE    Edema    pt  states had edema of feet and legs has been on lasix per dr. to help  GLUCOSE INTOLERANCE    HLD (hyperlipidemia)    Hypothyroidism    Dr. Cherlyn Roberts in [place] endo    MENOPAUSAL SYNDROME    Obesity    Osteoarthritis    bilat hips, severe   PVD (peripheral vascular disease) (HCC)    mild carotid 11/05   VITAMIN D DEFICIENCY    Past Surgical History:  Procedure Laterality Date   2D echo  11/29/2003   normal LV size. normal EF    BACK SURGERY  12/06/2016   L3-L5 laminectomies per Dr. Bettey Mare at [facility]   BREAST BIOPSY  05/11/2011   right breast   BREAST LUMPECTOMY     right breast   CARDIOVERSION N/A 04/28/2022   Procedure: CARDIOVERSION;  Surgeon: Tessa Lerner, DO;  Location: [facility] INVASIVE CV LAB;  Service: Cardiovascular;  Laterality: N/A;   CHOLECYSTECTOMY      FINGER SURGERY     HERNIA REPAIR     incisional hernia   INGUINAL HERNIA REPAIR     JOINT REPLACEMENT Bilateral    Dr. Despina Hick   normal caronary arteries     per pt. 2003   right rotator cuff surgery     SPINE SURGERY     TEE WITHOUT CARDIOVERSION N/A 04/28/2022   Procedure: TRANSESOPHAGEAL ECHOCARDIOGRAM;  Surgeon: Tessa Lerner, DO;  Location: [facility] INVASIVE CV LAB;  Service: Cardiovascular;  Laterality: N/A;   TONSILLECTOMY     TOTAL HIP ARTHROPLASTY  05/2008 and 10/2008   B hip - alusio    Current Outpatient Medications  Medication Sig Dispense Refill   albuterol (VENTOLIN HFA) 108 (90 Base) MCG/ACT inhaler Inhale 2 puffs into the lungs every 4 (four) hours as needed for wheezing or shortness of breath. 18 g 2   allopurinol (ZYLOPRIM) 100 MG tablet Take 1 tablet (100 mg total) by mouth daily. 30 tablet 2   apixaban (ELIQUIS) 5 MG TABS tablet Take 1 tablet (5 mg total) by mouth 2 (two) times daily. 180 tablet 1   atorvastatin (LIPITOR) 40 MG tablet Take 40 mg by mouth every evening.     Calcium Carbonate (CALCIUM 600 PO) Take 600 mg by mouth 2 (two) times daily.     Cholecalciferol 50 MCG (2000 UT) CAPS Take 2,000 Units by mouth in the morning and at bedtime.     dapagliflozin propanediol (FARXIGA) 10 MG TABS tablet Take 1 tablet (10 mg total) by mouth daily before breakfast. 90 tablet 0   furosemide (LASIX) 20 MG tablet Take 1 tablet (20 mg total) by mouth daily as needed for fluid. Take one tablet once a day 90 tablet 3   levothyroxine (SYNTHROID) 112 MCG tablet Take 1 tablet (112 mcg total) by mouth daily before breakfast. 90 tablet 3   LORazepam (ATIVAN) 0.5 MG tablet Take 0.5 mg by mouth every 8 (eight) hours as needed for anxiety.     metoprolol succinate (TOPROL-XL) 25 MG 24 hr tablet Take 1 tablet (25 mg total) by mouth daily. Hold if systolic blood pressure (top number) less than 100 mmHg or pulse less than 60 bpm. (Patient taking differently: Take 25 mg by mouth daily as needed (If  Bp is over 110/60). Hold if systolic blood pressure (top number) less than 100 mmHg or pulse less than 60 bpm.) 90 tablet 1   Nintedanib (OFEV) 150 MG CAPS Take 1 capsule (150 mg total) by mouth 2 (two) times daily. 180 capsule 1   ondansetron (ZOFRAN-ODT) 4 MG disintegrating tablet Take 4 mg by mouth every  8 (eight) hours as needed for nausea or vomiting.     OVER THE COUNTER MEDICATION Take 2 capsules by mouth daily. Juice Plus Berry Blend     oxybutynin (DITROPAN-XL) 10 MG 24 hr tablet Take 10 mg by mouth at bedtime.     No current facility-administered medications for this encounter.    Allergies  Allergen Reactions   Shellfish Allergy Anaphylaxis and Hives    Hives all over the body.    Percocet [Oxycodone-Acetaminophen] Hives   Iodinated Contrast Media Other (See Comments)    PT IS NOT AWARE OF IODINE ALLERGY, PREMEDICATED FOR PRIOR PREMEDS ONLY//A.C. - unknown reaction   Iodine Hives    Social History   Socioeconomic History   Marital status: Divorced    Spouse name: Not on file   Number of children: 5   Years of education: 1 year college   Highest education level: High school graduate  Occupational History   Not on file  Tobacco Use   Smoking status: Never    Passive exposure: Past   Smokeless tobacco: Never  Vaping Use   Vaping Use: Never used  Substance and Sexual Activity   Alcohol use: No    Alcohol/week: 0.0 standard drinks of alcohol   Drug use: No   Sexual activity: Not Currently  Other Topics Concern   Not on file  Social History Narrative   Divorced; works part time   5 children      Son "Rory" or daughter "Delorise Jackson" accompanies patient to appts   Social Determinants of Health   Financial Resource Strain: Low Risk  (05/21/2022)   Overall Financial Resource Strain (CARDIA)    Difficulty of Paying Living Expenses: Not hard at all  Food Insecurity: No Food Insecurity (05/21/2022)   Hunger Vital Sign    Worried About Running Out of Food in the Last Year:  Never true    Ran Out of Food in the Last Year: Never true  Transportation Needs: No Transportation Needs (05/21/2022)   PRAPARE - Administrator, Civil Service (Medical): No    Lack of Transportation (Non-Medical): No  Physical Activity: Sufficiently Active (05/21/2022)   Exercise Vital Sign    Days of Exercise per Week: 6 days    Minutes of Exercise per Session: 30 min  Stress: No Stress Concern Present (05/21/2022)   [employer] of Occupational Health - Occupational Stress Questionnaire    Feeling of Stress : Not at all  Social Connections: Moderately Integrated (05/21/2022)   Social Connection and Isolation Panel [NHANES]    Frequency of Communication with Friends and Family: More than three times a week    Frequency of Social Gatherings with Friends and Family: More than three times a week    Attends Religious Services: More than 4 times per year    Active Member of [employer] or Organizations: Yes    Attends Engineer, structural: More than 4 times per year    Marital Status: Divorced  Intimate Partner Violence: Not At Risk (05/21/2022)   Humiliation, Afraid, Rape, and Kick questionnaire    Fear of Current or Ex-Partner: No    Emotionally Abused: No    Physically Abused: No    Sexually Abused: No     ROS- All systems are reviewed and negative except as per the HPI above.  Physical Exam: Vitals:   06/01/22 1103  BP: (!) 128/100  Pulse: 77  Weight: 88.8 kg  Height: 5\' 7"  (1.702 m)  GEN- The patient is a well appearing female, alert and oriented x 3 today. Short of breath at baseline. Head- normocephalic, atraumatic Eyes-  Sclera clear, conjunctiva pink Ears- hearing intact Oropharynx- clear Neck- supple  Lungs- Clear to ausculation bilaterally, normal work of breathing Heart- Irregular rate and rhythm, no murmurs, rubs or gallops  GI- soft, NT, ND, + BS Extremities- no clubbing, cyanosis, or edema MS- no significant deformity or atrophy Skin-  no rash or lesion Psych- euthymic mood, full affect Neuro- strength and sensation are intact  Wt Readings from Last 3 Encounters:  06/01/22 88.8 kg  05/25/22 83.5 kg  05/21/22 87.1 kg    EKG today demonstrates  Vent. rate 77 BPM PR interval * ms QRS duration 86 ms QT/QTcB 358/405 ms P-R-T axes * 108 -9 Atrial fibrillation with a competing junctional pacemaker Rightward axis Low voltage QRS Abnormal QRS-T angle, consider primary T wave abnormality Abnormal ECG When compared with ECG of 28-Apr-2022 11:28, PREVIOUS ECG IS PRESENT  Echo TEE 04/28/22 demonstrated:  1. Left ventricular ejection fraction, by estimation, is 55 to 60%. The  left ventricle has normal function. The left ventricle has no regional  wall motion abnormalities.   2. Right ventricular systolic function is normal. The right ventricular  size is normal.   3. No left atrial/left atrial appendage thrombus was detected. The LAA  emptying velocity was 30 cm/s.   4. The mitral valve is normal in structure. Mild mitral valve  regurgitation. No evidence of mitral stenosis.   5. Tricuspid valve regurgitation is mild to moderate.   6. The aortic valve is tricuspid. Aortic valve regurgitation is not  visualized. Aortic valve sclerosis is present, with no evidence of aortic  valve stenosis.   7. The inferior vena cava is normal in size with greater than 50%  respiratory variability, suggesting right atrial pressure of 3 mmHg.   Conclusion(s)/Recommendation(s): No LA/LAA thrombus identified. Successful  cardioversion performed with restoration of normal sinus rhythm.   Epic records are reviewed at length today.  CHA2DS2-VASc Score = 6  The patient's score is based upon: CHF History: 1 HTN History: 1 Diabetes History: 0 Stroke History: 0 Vascular Disease History: 1 Age Score: 2 Gender Score: 1       ASSESSMENT AND PLAN: Persistent Atrial Fibrillation (ICD10:  I48.19) The patient's CHA2DS2-VASc score is 6,  indicating a 9.7% annual risk of stroke.    Patient would like to pursue dofetilide and presents today for dofetilide admission. Continue Eliquis 5 mg BID, states no missed doses in the last 3 weeks. No recent benadryl use. PharmD has screened medications QTc in SR 431 ms (ECG 04/28/22) Labs today show creatinine at 1.23, K+ 4.3 and mag 1.6, CrCl calculated at 46  Crcl 46 mL/min makes her eligible for 250 mcg BID dose  Secondary Hypercoagulable State (ICD10:  D68.69) The patient is at significant risk for stroke/thromboembolism based upon her CHA2DS2-VASc Score of 6.  Continue Apixaban (Eliquis).   No missed doses.   3. Obesity Body mass index is 30.67 kg/m. Lifestyle modification was discussed at length including regular exercise and weight reduction. She uses a walker for mobility, daily walking as tolerated.    Pt will go to admissions, she will go to room 6E13 when ready.    Lake Bells, PA-C [facility] [address] [place], [place] [address] [phone] 06/01/2022 11:26 AM

## 2022-06-01 NOTE — TOC Benefit Eligibility Note (Signed)
Patient Product/process development scientist completed.    The patient is currently admitted and upon discharge could be taking dofetilide (Tikosyn) 250 mcg capsules.  The current 30 day co-pay is $49.98.   The patient is insured through [employer] Part D   This test claim was processed through [facility]- copay amounts may vary at other pharmacies due to pharmacy/plan contracts, or as the patient moves through the different stages of their insurance plan.  Roland Earl, CPHT Pharmacy Patient Advocate Specialist [facility] Patient Advocate Team Direct Number: [phone]  Fax: [phone]

## 2022-06-01 NOTE — TOC CM/SW Note (Addendum)
Transition of Care [facility]) - Inpatient Brief Assessment   Patient Details  Name: Rachael [name] MRN: [id number] Date of Birth: 10-Jan-1934  Transition of Care [facility]) CM/SW Contact:    Gala Lewandowsky, RN Phone Number: 06/01/2022, 2:30 PM   Clinical Narrative: Transition of Care Department [facility]) has reviewed the patient. Patient presented for Tikosyn Load. Benefits check submitted for cost. Case Manager will discuss cost and pharmacy of choice as the patient progresses.  Cost: $49.98  Transition of Care Asessment: Insurance and Status: Insurance coverage has been reviewed Patient has primary care physician: Yes Home environment has been reviewed: yes Prior level of function:: reviewed Prior/Current Home Services: No current home services Social Determinants of Health Reivew: SDOH reviewed no interventions necessary Readmission risk has been reviewed: Yes Transition of care needs: transition of care needs identified, TOC will continue to follow

## 2022-06-01 NOTE — Progress Notes (Signed)
Pharmacy: Dofetilide (Tikosyn) - Initial Consult Assessment and Electrolyte Replacement  Pharmacy consulted to assist in monitoring and replacing electrolytes in this 89 y.o. female admitted on 06/01/2022 undergoing dofetilide initiation.   Assessment:  Patient Exclusion Criteria: If any screening criteria checked as "Yes", then  patient  should NOT receive dofetilide until criteria item is corrected.  If "Yes" please indicate correction plan.  YES  NO Patient  Exclusion Criteria Correction Plan   []   [x]   Baseline QTc interval is greater than or equal to 440 msec. IF above YES box checked dofetilide contraindicated unless patient has ICD; then may proceed if QTc 500-550 msec or with known ventricular conduction abnormalities may proceed with QTc 550-600 msec. QTc = 431 per clinic note    []   [x]   Patient is known or suspected to have a digoxin level greater than 2 ng/ml: No results found for: "DIGOXIN"     []   [x]   Creatinine clearance less than 20 ml/min (calculated using Cockcroft-Gault, actual body weight and serum creatinine): Estimated Creatinine Clearance: 37.5 mL/min (A) (by C-G formula based on SCr of 1.23 mg/dL (H)).     []   [x]  Patient has received drugs known to prolong the QT intervals within the last 48 hours (phenothiazines, tricyclics or tetracyclic antidepressants, erythromycin, H-1 antihistamines, cisapride, fluoroquinolones, azithromycin, ondansetron).   Updated information on QT prolonging agents is available to be searched on the following database:QT prolonging agents     []   [x]   Patient received a dose of hydrochlorothiazide (Oretic) alone or in any combination including triamterene (Dyazide, Maxzide) in the last 48 hours.    []   [x]  Patient received a medication known to increase dofetilide plasma concentrations prior to initial dofetilide dose:  Trimethoprim (Primsol, Proloprim) in the last 36 hours Verapamil (Calan, Verelan) in the last 36 hours or  a sustained release dose in the last 72 hours Megestrol (Megace) in the last 5 days  Cimetidine (Tagamet) in the last 6 hours Ketoconazole (Nizoral) in the last 24 hours Itraconazole (Sporanox) in the last 48 hours  Prochlorperazine (Compazine) in the last 36 hours     []   [x]   Patient is known to have a history of torsades de pointes; congenital or acquired long QT syndromes.    []   [x]   Patient has received a Class 1 antiarrhythmic with less than 2 half-lives since last dose. (Disopyramide, Quinidine, Procainamide, Lidocaine, Mexiletine, Flecainide, Propafenone)    []   [x]   Patient has received amiodarone therapy in the past 3 months or amiodarone level is greater than 0.3 ng/ml.    Labs:    Component Value Date/Time   K 4.3 06/01/2022 1139   K 4.1 10/15/2015 0943   MG 1.6 (L) 06/01/2022 1139     Plan: Select One Calculated CrCl  Dose q12h  []  > 60 ml/min 500 mcg  [x]  40-60 ml/min 250 mcg  []  20-40 ml/min 125 mcg   []   Physician selected initial dose within range recommended for patients level of renal function - will monitor for response.  []   Physician selected initial dose outside of range recommended for patients level of renal function - will discuss if the dose should be altered at this time.   Patient has been appropriately anticoagulated with apixaban.  Potassium: K >/= 4: Appropriate to initiate Tikosyn, no replacement needed    Magnesium: Mg 1.3-1.7: Hold Tikosyn initiation and give Mg 4 gm x1 IV, recheck Mg 4hr after end of infusion - repeat appropriate dose if  not > 1.8     Thank you for allowing pharmacy to participate in this patient's care   Harland German, PharmD Clinical Pharmacist **Pharmacist phone directory can now be found on [email or web address] ([email or web address]).  Listed under [facility].

## 2022-06-01 NOTE — Telephone Encounter (Signed)
Pharmacy Patient Advocate Encounter  Insurance verification completed.    The patient is insured through [employer] Part D   The patient is currently admitted and ran test claims for the following: dofetilide (Tikosyn).  Copays and coinsurance results were relayed to Inpatient clinical team.  

## 2022-06-01 NOTE — H&P (Cosign Needed)
Primary Care Physician: Nelwyn Salisbury, MD Primary Cardiologist: Dr. Odis Hollingshead Primary Electrophysiologist: Dr. Ladona Ridgel Referring Physician: Dr. Markham Jordan is a 89 y.o. female with a history of SVT/AT, pulmonary fibrosis, sleep apnea, aortic atherosclerosis, HTN, HFpEF, carotid stenosis, HLD, hypothyroidism, sinus node dysfunction, and persistent atrial fibrillation who presents for consultation in the [facility]. S/p successful TEE/DCCV on 04/28/22 with ERAF. The patient was initially diagnosed with atrial fibrillation 02/2022. Patient is on Eliquis 5 mg BID for a CHADS2VASC score of 6.  On evaluation today, she is currently in rate controlled Afib. She is here for Tikosyn admission. Pharmacist review of medication list recommended patient to stop taking Zofran but patient stated she was not taking this medication. No other contraindications noted. She is not taking benadryl. She has not taken new medications prescribed since pharmacist review. She has taken her morning medications as directed. She has not been taking her Toprol over the past few days per son, due to BP 110s/60s.   She is compliant with anticoagulation and has not missed any doses. She has no bleeding concerns.  Today, she denies symptoms of palpitations, chest pain, shortness of breath, orthopnea, PND, lower extremity edema, dizziness, presyncope, syncope, snoring, daytime somnolence, bleeding, or neurologic sequela. The patient is tolerating medications without difficulties and is otherwise without complaint today.   she has a BMI of Body mass index is 30.6 kg/m.Marland Kitchen Filed Weights   06/01/22 1303  Weight: 88.6 kg    Family History  Problem Relation Age of Onset   Uterine cancer Mother 4   Cancer Mother    Ovarian cancer Mother    Heart disease Father        before age 65   Varicose Veins Father    Heart disease Brother        before age 52   Prostate cancer Maternal Uncle         diagnosed in his 65s   Prostate cancer Paternal Uncle        diagnosed in his 52s   Clotting disorder Maternal Grandmother    Pancreatic cancer Maternal Grandfather        diagnosed in his 66s   Breast cancer Daughter 2       BRCA negative (no BART)   Cancer Daughter    Diabetes Son    Colon cancer Cousin    Leukemia Cousin    Uterine cancer Cousin    Prostate cancer Cousin    Coronary artery disease Other        family hx - female 1st degree relative <60   Hypothyroidism Other        aunt    Anesthesia problems Neg Hx    Adrenal disorder Neg Hx    Esophageal cancer Neg Hx    Stomach cancer Neg Hx      Atrial Fibrillation Management history:  Previous antiarrhythmic drugs: Flecainide (discontinued due to bradycardia) Previous cardioversions: 04/28/22 Previous ablations: None Anticoagulation history: Eliquis 5 mg BID   Past Medical History:  Diagnosis Date   Anxiety state 11/14/2007   Qualifier: Diagnosis of  By: Jonny Ruiz MD, Len Blalock    Breast cancer [facility])    R   CAROTID STENOSIS    Chronic venous insufficiency    CONTACT DERMATITIS    DUPUYTREN'S CONTRACTURE    Edema    pt  states had edema of feet and legs has been on lasix per dr. to help  GLUCOSE INTOLERANCE    HLD (hyperlipidemia)    Hypothyroidism    Dr. Cherlyn Roberts in [place] endo    MENOPAUSAL SYNDROME    Obesity    Osteoarthritis    bilat hips, severe   PVD (peripheral vascular disease) (HCC)    mild carotid 11/05   VITAMIN D DEFICIENCY    Past Surgical History:  Procedure Laterality Date   2D echo  11/29/2003   normal LV size. normal EF    BACK SURGERY  12/06/2016   L3-L5 laminectomies per Dr. Bettey Mare [facility]   BREAST BIOPSY  05/11/2011   right breast   BREAST LUMPECTOMY     right breast   CARDIOVERSION N/A 04/28/2022   Procedure: CARDIOVERSION;  Surgeon: Tessa Lerner, DO;  Location: [facility] INVASIVE CV LAB;  Service: Cardiovascular;  Laterality: N/A;   CHOLECYSTECTOMY     FINGER  SURGERY     HERNIA REPAIR     incisional hernia   INGUINAL HERNIA REPAIR     JOINT REPLACEMENT Bilateral    Dr. Despina Hick   normal caronary arteries     per pt. 2003   right rotator cuff surgery     SPINE SURGERY     TEE WITHOUT CARDIOVERSION N/A 04/28/2022   Procedure: TRANSESOPHAGEAL ECHOCARDIOGRAM;  Surgeon: Tessa Lerner, DO;  Location: [facility] INVASIVE CV LAB;  Service: Cardiovascular;  Laterality: N/A;   TONSILLECTOMY     TOTAL HIP ARTHROPLASTY  05/2008 and 10/2008   B hip - alusio    Current Facility-Administered Medications  Medication Dose Route Frequency Provider Last Rate Last Admin   0.9 %  sodium chloride infusion  250 mL Intravenous PRN Eustace Pen, PA-C       albuterol (PROVENTIL) (2.5 MG/3ML) 0.083% nebulizer solution 3 mL  3 mL Inhalation Q4H PRN Eustace Pen, PA-C       [START ON 06/02/2022] allopurinol (ZYLOPRIM) tablet 100 mg  100 mg Oral Daily Eustace Pen, PA-C       apixaban Everlene Balls) tablet 5 mg  5 mg Oral BID Eustace Pen, PA-C       atorvastatin (LIPITOR) tablet 40 mg  40 mg Oral QPM Eustace Pen, PA-C       [START ON 06/02/2022] dapagliflozin propanediol (FARXIGA) tablet 10 mg  10 mg Oral QAC breakfast Eustace Pen, PA-C       dofetilide [facility]) capsule 250 mcg  250 mcg Oral BID Eustace Pen, PA-C       furosemide (LASIX) tablet 20 mg  20 mg Oral Daily PRN Eustace Pen, PA-C       [START ON 06/02/2022] levothyroxine (SYNTHROID) tablet 112 mcg  112 mcg Oral QAC breakfast Eustace Pen, PA-C       magnesium sulfate IVPB 4 g 100 mL  4 g Intravenous Once Silvana Newness, RPH       Nintedanib (OFEV) CAPS 150 mg  150 mg Oral BID Eustace Pen, PA-C       oxybutynin (DITROPAN-XL) 24 hr tablet 10 mg  10 mg Oral QHS Eustace Pen, PA-C       sodium chloride flush (NS) 0.9 % injection 3 mL  3 mL Intravenous Q12H Eustace Pen, PA-C       sodium chloride flush (NS) 0.9 % injection 3 mL  3 mL Intravenous PRN Eustace Pen, PA-C         Allergies  Allergen Reactions   Shellfish Allergy Anaphylaxis and Hives  Hives all over the body.    Percocet [Oxycodone-Acetaminophen] Hives   Iodinated Contrast Media Other (See Comments)    PT IS NOT AWARE OF IODINE ALLERGY, PREMEDICATED FOR PRIOR PREMEDS ONLY//A.C. - unknown reaction   Iodine Hives    Social History   Socioeconomic History   Marital status: Divorced    Spouse name: Not on file   Number of children: 5   Years of education: 1 year college   Highest education level: High school graduate  Occupational History   Not on file  Tobacco Use   Smoking status: Never    Passive exposure: Past   Smokeless tobacco: Never  Vaping Use   Vaping Use: Never used  Substance and Sexual Activity   Alcohol use: No    Alcohol/week: 0.0 standard drinks of alcohol   Drug use: No   Sexual activity: Not Currently  Other Topics Concern   Not on file  Social History Narrative   Divorced; works part time   5 children      Son "Rory" or daughter "Delorise Jackson" accompanies patient to appts   Social Determinants of Health   Financial Resource Strain: Low Risk  (05/21/2022)   Overall Financial Resource Strain (CARDIA)    Difficulty of Paying Living Expenses: Not hard at all  Food Insecurity: No Food Insecurity (05/21/2022)   Hunger Vital Sign    Worried About Running Out of Food in the Last Year: Never true    Ran Out of Food in the Last Year: Never true  Transportation Needs: No Transportation Needs (05/21/2022)   PRAPARE - Administrator, Civil Service (Medical): No    Lack of Transportation (Non-Medical): No  Physical Activity: Sufficiently Active (05/21/2022)   Exercise Vital Sign    Days of Exercise per Week: 6 days    Minutes of Exercise per Session: 30 min  Stress: No Stress Concern Present (05/21/2022)   [employer] of Occupational Health - Occupational Stress Questionnaire    Feeling of Stress : Not at all  Social Connections: Moderately Integrated  (05/21/2022)   Social Connection and Isolation Panel [NHANES]    Frequency of Communication with Friends and Family: More than three times a week    Frequency of Social Gatherings with Friends and Family: More than three times a week    Attends Religious Services: More than 4 times per year    Active Member of [employer] or Organizations: Yes    Attends Engineer, structural: More than 4 times per year    Marital Status: Divorced  Intimate Partner Violence: Not At Risk (05/21/2022)   Humiliation, Afraid, Rape, and Kick questionnaire    Fear of Current or Ex-Partner: No    Emotionally Abused: No    Physically Abused: No    Sexually Abused: No     ROS- All systems are reviewed and negative except as per the HPI above.  Physical Exam: Vitals:   06/01/22 1303  BP: 102/61  Pulse: 84  Resp: (!) 24  Temp: 98.2 F (36.8 C)  TempSrc: Oral  SpO2: 100%  Weight: 88.6 kg  Height: 5\' 7"  (1.702 m)    GEN- The patient is a well appearing female, alert and oriented x 3 today. Short of breath at baseline. Head- normocephalic, atraumatic Eyes-  Sclera clear, conjunctiva pink Ears- hearing intact Oropharynx- clear Neck- supple  Lungs- Clear to ausculation bilaterally, normal work of breathing Heart- Irregular rate and rhythm, no murmurs, rubs or gallops  GI- soft,  NT, ND, + BS Extremities- no clubbing, cyanosis, or edema MS- no significant deformity or atrophy Skin- no rash or lesion Psych- euthymic mood, full affect Neuro- strength and sensation are intact  Wt Readings from Last 3 Encounters:  06/01/22 88.6 kg  06/01/22 88.8 kg  05/25/22 83.5 kg    EKG today demonstrates  Vent. rate 77 BPM PR interval * ms QRS duration 86 ms QT/QTcB 358/405 ms P-R-T axes * 108 -9 Atrial fibrillation with a competing junctional pacemaker Rightward axis Low voltage QRS Abnormal QRS-T angle, consider primary T wave abnormality Abnormal ECG When compared with ECG of 28-Apr-2022  11:28, PREVIOUS ECG IS PRESENT  Echo TEE 04/28/22 demonstrated:  1. Left ventricular ejection fraction, by estimation, is 55 to 60%. The  left ventricle has normal function. The left ventricle has no regional  wall motion abnormalities.   2. Right ventricular systolic function is normal. The right ventricular  size is normal.   3. No left atrial/left atrial appendage thrombus was detected. The LAA  emptying velocity was 30 cm/s.   4. The mitral valve is normal in structure. Mild mitral valve  regurgitation. No evidence of mitral stenosis.   5. Tricuspid valve regurgitation is mild to moderate.   6. The aortic valve is tricuspid. Aortic valve regurgitation is not  visualized. Aortic valve sclerosis is present, with no evidence of aortic  valve stenosis.   7. The inferior vena cava is normal in size with greater than 50%  respiratory variability, suggesting right atrial pressure of 3 mmHg.   Conclusion(s)/Recommendation(s): No LA/LAA thrombus identified. Successful  cardioversion performed with restoration of normal sinus rhythm.   Epic records are reviewed at length today.  CHA2DS2-VASc Score = 6  The patient's score is based upon: CHF History: 1 HTN History: 1 Diabetes History: 0 Stroke History: 0 Vascular Disease History: 1 Age Score: 2 Gender Score: 1       ASSESSMENT AND PLAN: Persistent Atrial Fibrillation (ICD10:  I48.19) The patient's CHA2DS2-VASc score is 6, indicating a 9.7% annual risk of stroke.    Patient would like to pursue dofetilide and presents today for dofetilide admission. Continue Eliquis 5 mg BID, states no missed doses in the last 3 weeks. No recent benadryl use. PharmD has screened medications QTc in SR 431 ms (ECG 04/28/22) Labs today show creatinine at 1.23, K+ 4.3 and mag 1.6, CrCl calculated at 46  Crcl 46 mL/min makes her eligible for 250 mcg BID dose  Secondary Hypercoagulable State (ICD10:  D68.69) The patient is at significant risk for  stroke/thromboembolism based upon her CHA2DS2-VASc Score of 6.  Continue Apixaban (Eliquis).   No missed doses.   3. Obesity Body mass index is 30.6 kg/m. Lifestyle modification was discussed at length including regular exercise and weight reduction. She uses a walker for mobility, daily walking as tolerated.     Patient presents for Tikosyn load without change to above.    Canary Brim, MSN, APRN, NP-C, AGACNP-BC [facility] - Electrophysiology  06/01/2022, 1:21 PM   EP Attending  Patient seen and examined. Agree with the findings as noted above. The patient is admitted for initiation of dofetiled. See my note already in the chart for more details. She will be started on 250 mcg bid and we will plan DCCV in 2 days if she has not converted spontaneously.  Sharlot Gowda Taylor,MD

## 2022-06-02 DIAGNOSIS — I4819 Other persistent atrial fibrillation: Principal | ICD-10-CM

## 2022-06-02 LAB — BASIC METABOLIC PANEL
Anion gap: 8 (ref 5–15)
BUN: 19 mg/dL (ref 8–23)
CO2: 22 mmol/L (ref 22–32)
Calcium: 9.1 mg/dL (ref 8.9–10.3)
Chloride: 106 mmol/L (ref 98–111)
Creatinine, Ser: 1.13 mg/dL — ABNORMAL HIGH (ref 0.44–1.00)
GFR, Estimated: 47 mL/min — ABNORMAL LOW (ref >60)
Glucose, Bld: 99 mg/dL (ref 70–99)
Potassium: 3.5 mmol/L (ref 3.5–5.1)
Sodium: 136 mmol/L (ref 135–145)

## 2022-06-02 LAB — PROTIME-INR
INR: 1.3 — ABNORMAL HIGH (ref 0.8–1.2)
Prothrombin Time: 16 seconds — ABNORMAL HIGH (ref 11.4–15.2)

## 2022-06-02 LAB — MAGNESIUM: Magnesium: 2.3 mg/dL (ref 1.7–2.4)

## 2022-06-02 MED ORDER — SODIUM CHLORIDE 0.9 % IV SOLN: INTRAVENOUS | Status: DC

## 2022-06-02 MED ORDER — POTASSIUM CHLORIDE CRYS ER 20 MEQ PO TBCR
60.0000 meq | EXTENDED_RELEASE_TABLET | Freq: Once | ORAL | Status: AC
  Administered 2022-06-02: 60 meq via ORAL
  Filled 2022-06-02: qty 3

## 2022-06-02 MED ORDER — POLYETHYLENE GLYCOL 3350 17 G PO PACK
17.0000 g | PACK | Freq: Every day | ORAL | Status: DC
  Administered 2022-06-02: 17 g via ORAL
  Filled 2022-06-02 (×3): qty 1

## 2022-06-02 MED ORDER — DOCUSATE SODIUM 100 MG PO CAPS
100.0000 mg | ORAL_CAPSULE | Freq: Two times a day (BID) | ORAL | Status: DC
  Administered 2022-06-02 – 2022-06-04 (×2): 100 mg via ORAL
  Filled 2022-06-02 (×6): qty 1

## 2022-06-02 NOTE — H&P (View-Only) (Signed)
Electrophysiology Rounding Note  Patient Name: Rachael [name] Date of Encounter: 06/02/2022  Primary Cardiologist: None  Electrophysiologist: Lewayne Bunting, MD    Subjective   Pt remains in afib on Tikosyn 250 mcg BID   QTc from EKG last pm shows stable QTc at .  The patient reports she is constipated, new for her.  At this time, the patient denies chest pain, shortness of breath, or any new concerns.  Inpatient Medications    Scheduled Meds:  allopurinol  100 mg Oral Daily   apixaban  5 mg Oral BID   atorvastatin  40 mg Oral QPM   dapagliflozin propanediol  10 mg Oral QAC breakfast   docusate sodium  100 mg Oral BID   dofetilide  250 mcg Oral BID   levothyroxine  112 mcg Oral QAC breakfast   Nintedanib  150 mg Oral BID   oxybutynin  10 mg Oral QHS   polyethylene glycol  17 g Oral Daily   potassium chloride  60 mEq Oral Once   sodium chloride flush  3 mL Intravenous Q12H   Continuous Infusions:  sodium chloride     PRN Meds: sodium chloride, albuterol, furosemide, sodium chloride flush   Vital Signs    Vitals:   06/01/22 1303 06/01/22 1647 06/01/22 2023 06/02/22 0523  BP: 102/61 106/73 116/68 107/67  Pulse: 84 (!) 59 73 78  Resp: (!) 24 16 20 18   Temp: 98.2 F (36.8 C) 98.1 F (36.7 C) 97.6 F (36.4 C) 97.6 F (36.4 C)  TempSrc: Oral Oral Oral Oral  SpO2: 100% 99% 97% 96%  Weight: 88.6 kg     Height: 5\' 7"  (1.702 m)       Intake/Output Summary (Last 24 hours) at 06/02/2022 0751 Last data filed at 06/01/2022 1549 Gross per 24 hour  Intake 304.36 ml  Output --  Net 304.36 ml   Filed Weights   06/01/22 1303  Weight: 88.6 kg    Physical Exam    GEN- NAD, A&O x 3. Normal affect.  Lungs- CTAB, Normal effort.  Heart- Irregularly irregular rate and rhythm. No M/G/R GI- Soft, NT, ND Extremities- No clubbing, cyanosis, or edema Skin- no rash or lesion  Labs    CBC No results for input(s): "WBC", "NEUTROABS", "HGB", "HCT", "MCV", "PLT" in the  last 72 hours. Basic Metabolic Panel Recent Labs    16/10/96 1139 06/01/22 1804 06/02/22 0152  NA 135  --  136  K 4.3  --  3.5  CL 100  --  106  CO2 24  --  22  GLUCOSE 116*  --  99  BUN 22  --  19  CREATININE 1.23*  --  1.13*  CALCIUM 9.7  --  9.1  MG 1.6* 2.8* 2.3    Telemetry    Atrial fibrillation, rates of 100-120's, rare PVC (personally reviewed)  Patient Profile     Rachael [name] is a 89 y.o. female with a past medical history significant for persistent atrial fibrillation (initially dx in 02/2022), HLD, HTN, HFpEF, carotid stenosis, sinus node dysfunction.  Additional hx includes OSA, pulmonary fibrosis, hypothyroidism.They were admitted for tikosyn load. She previously had a successful TEE/DCCV on 04/28/22 with ERAF.  She is on eliquis 5mg  BID for CHADSVASC score of 6.   Assessment & Plan    Persistent atrial fibrillation Pt remains in afib on Tikosyn 250 mcg BID, adjusted for renal function  Continue Eliquis Creatinine, ser  1.13* (05/22 0152) Magnesium  2.3 (  05/22 0152) Potassium3.5 (05/22 0152) Supplement K  If pt does not convert chemically, plan on DCCV Thursday    For questions or updates, please contact [facility] Please consult [email or web address] for contact info under Cardiology/STEMI.  Signed, Canary Brim, MSN, APRN, NP-C, AGACNP-BC [facility] - Electrophysiology  06/02/2022, 7:53 AM  EP Attending  Patient seen and examined. Agree with above. The patient is doing well this morning and her QT is acceptable. She is still in atrial fib. We will continue dofetilide and plan for DCCV tomorrow if she has not reverted back to NSR.  Sharlot Gowda Jayson Waterhouse,MD

## 2022-06-02 NOTE — Plan of Care (Signed)

## 2022-06-02 NOTE — Progress Notes (Signed)
Pharmacy: Dofetilide (Tikosyn) - Follow Up Assessment and Electrolyte Replacement  Pharmacy consulted to assist in monitoring and replacing electrolytes in this 89 y.o. female admitted on 06/01/2022 undergoing dofetilide initiation.   Labs:    Component Value Date/Time   K 3.5 06/02/2022 0152   K 4.1 10/15/2015 0943   MG 2.3 06/02/2022 0152     Plan: Potassium: K 3.5-3.7:  Give KCl 60 mEq po x1   Magnesium: Mg > 2: No additional supplementation needed  Harland German, PharmD Clinical Pharmacist **Pharmacist phone directory can now be found on [email or web address] ([email or web address]).  Listed under [facility].

## 2022-06-02 NOTE — Progress Notes (Addendum)
 Electrophysiology Rounding Note  Patient Name: Rachael [name] Date of Encounter: 06/02/2022  Primary Cardiologist: None  Electrophysiologist: Uchenna Seufert, MD    Subjective   Pt remains in afib on Tikosyn 250 mcg BID   QTc from EKG last pm shows stable QTc at 400ms.  The patient reports she is constipated, new for her.  At this time, the patient denies chest pain, shortness of breath, or any new concerns.  Inpatient Medications    Scheduled Meds:  allopurinol  100 mg Oral Daily   apixaban  5 mg Oral BID   atorvastatin  40 mg Oral QPM   dapagliflozin propanediol  10 mg Oral QAC breakfast   docusate sodium  100 mg Oral BID   dofetilide  250 mcg Oral BID   levothyroxine  112 mcg Oral QAC breakfast   Nintedanib  150 mg Oral BID   oxybutynin  10 mg Oral QHS   polyethylene glycol  17 g Oral Daily   potassium chloride  60 mEq Oral Once   sodium chloride flush  3 mL Intravenous Q12H   Continuous Infusions:  sodium chloride     PRN Meds: sodium chloride, albuterol, furosemide, sodium chloride flush   Vital Signs    Vitals:   06/01/22 1303 06/01/22 1647 06/01/22 2023 06/02/22 0523  BP: 102/61 106/73 116/68 107/67  Pulse: 84 (!) 59 73 78  Resp: (!) 24 16 20 18  Temp: 98.2 F (36.8 C) 98.1 F (36.7 C) 97.6 F (36.4 C) 97.6 F (36.4 C)  TempSrc: Oral Oral Oral Oral  SpO2: 100% 99% 97% 96%  Weight: 88.6 kg     Height: 5' 7" (1.702 m)       Intake/Output Summary (Last 24 hours) at 06/02/2022 0751 Last data filed at 06/01/2022 1549 Gross per 24 hour  Intake 304.36 ml  Output --  Net 304.36 ml   Filed Weights   06/01/22 1303  Weight: 88.6 kg    Physical Exam    GEN- NAD, A&O x 3. Normal affect.  Lungs- CTAB, Normal effort.  Heart- Irregularly irregular rate and rhythm. No M/G/R GI- Soft, NT, ND Extremities- No clubbing, cyanosis, or edema Skin- no rash or lesion  Labs    CBC No results for input(s): "WBC", "NEUTROABS", "HGB", "HCT", "MCV", "PLT" in the  last 72 hours. Basic Metabolic Panel Recent Labs    06/01/22 1139 06/01/22 1804 06/02/22 0152  NA 135  --  136  K 4.3  --  3.5  CL 100  --  106  CO2 24  --  22  GLUCOSE 116*  --  99  BUN 22  --  19  CREATININE 1.23*  --  1.13*  CALCIUM 9.7  --  9.1  MG 1.6* 2.8* 2.3    Telemetry    Atrial fibrillation, rates of 100-120's, rare PVC (personally reviewed)  Patient Profile     Rachael [name] is a 89 y.o. female with a past medical history significant for persistent atrial fibrillation (initially dx in 02/2022), HLD, HTN, HFpEF, carotid stenosis, sinus node dysfunction.  Additional hx includes OSA, pulmonary fibrosis, hypothyroidism.They were admitted for tikosyn load. She previously had a successful TEE/DCCV on 04/28/22 with ERAF.  She is on eliquis 5mg BID for CHADSVASC score of 6.   Assessment & Plan    Persistent atrial fibrillation Pt remains in afib on Tikosyn 250 mcg BID, adjusted for renal function  Continue Eliquis Creatinine, ser  1.13* (05/22 0152) Magnesium  2.3 (  05/22 0152) Potassium3.5 (05/22 0152) Supplement K  If pt does not convert chemically, plan on DCCV Thursday    For questions or updates, please contact [facility] Please consult [email or web address] for contact info under Cardiology/STEMI.  Signed, Brandi Ollis, MSN, APRN, NP-C, AGACNP-BC [facility] - Electrophysiology  06/02/2022, 7:53 AM  EP Attending  Patient seen and examined. Agree with above. The patient is doing well this morning and her QT is acceptable. She is still in atrial fib. We will continue dofetilide and plan for DCCV tomorrow if she has not reverted back to NSR.  Sherri Mcarthy,MD  

## 2022-06-02 NOTE — Telephone Encounter (Signed)
Spoke with pt stated that she at the hospital for her AFIB procedure, pt stated that the diarrhea had stopped but got constipation and she Is getting laxatives at the hospital.

## 2022-06-03 ENCOUNTER — Encounter (HOSPITAL_COMMUNITY)
Admission: RE | Disposition: A | Discharge status: Home / Self Care | Payer: Self-pay | Source: Ambulatory Visit | Attending: Internal Medicine

## 2022-06-03 ENCOUNTER — Inpatient Hospital Stay (HOSPITAL_COMMUNITY): Payer: Medicare HMO | Admitting: Certified Registered Nurse Anesthetist

## 2022-06-03 ENCOUNTER — Encounter (HOSPITAL_COMMUNITY): Payer: Self-pay | Admitting: Cardiology

## 2022-06-03 ENCOUNTER — Other Ambulatory Visit: Payer: Self-pay

## 2022-06-03 DIAGNOSIS — I4891 Unspecified atrial fibrillation: Secondary | ICD-10-CM

## 2022-06-03 DIAGNOSIS — J449 Chronic obstructive pulmonary disease, unspecified: Secondary | ICD-10-CM

## 2022-06-03 DIAGNOSIS — I509 Heart failure, unspecified: Secondary | ICD-10-CM

## 2022-06-03 DIAGNOSIS — I110 Hypertensive heart disease with heart failure: Secondary | ICD-10-CM

## 2022-06-03 HISTORY — PX: CARDIOVERSION: SHX1299

## 2022-06-03 LAB — BASIC METABOLIC PANEL
Anion gap: 8 (ref 5–15)
Anion gap: 10 (ref 5–15)
BUN: 14 mg/dL (ref 8–23)
BUN: 12 mg/dL (ref 8–23)
CO2: 24 mmol/L (ref 22–32)
CO2: 24 mmol/L (ref 22–32)
Calcium: 9.1 mg/dL (ref 8.9–10.3)
Calcium: 9.2 mg/dL (ref 8.9–10.3)
Chloride: 106 mmol/L (ref 98–111)
Chloride: 103 mmol/L (ref 98–111)
Creatinine, Ser: 0.94 mg/dL (ref 0.44–1.00)
Creatinine, Ser: 1.05 mg/dL — ABNORMAL HIGH (ref 0.44–1.00)
GFR, Estimated: 59 mL/min — ABNORMAL LOW (ref >60)
GFR, Estimated: 52 mL/min — ABNORMAL LOW (ref >60)
Glucose, Bld: 94 mg/dL (ref 70–99)
Glucose, Bld: 110 mg/dL — ABNORMAL HIGH (ref 70–99)
Potassium: 4 mmol/L (ref 3.5–5.1)
Potassium: 4.1 mmol/L (ref 3.5–5.1)
Sodium: 138 mmol/L (ref 135–145)
Sodium: 137 mmol/L (ref 135–145)

## 2022-06-03 LAB — MAGNESIUM
Magnesium: 2.1 mg/dL (ref 1.7–2.4)
Magnesium: 2 mg/dL (ref 1.7–2.4)

## 2022-06-03 LAB — GLUCOSE, CAPILLARY: Glucose-Capillary: 93 mg/dL (ref 70–99)

## 2022-06-03 SURGERY — CARDIOVERSION
Anesthesia: General

## 2022-06-03 MED ORDER — LIDOCAINE 2% (20 MG/ML) 5 ML SYRINGE
INTRAMUSCULAR | Status: DC | PRN
  Administered 2022-06-03: 50 mg via INTRAVENOUS

## 2022-06-03 MED ORDER — PROPOFOL 10 MG/ML IV BOLUS
INTRAVENOUS | Status: DC | PRN
  Administered 2022-06-03: 50 mg via INTRAVENOUS

## 2022-06-03 SURGICAL SUPPLY — 1 items: ELECT DEFIB PAD ADLT CADENCE (PAD) ×2 IMPLANT

## 2022-06-03 NOTE — Progress Notes (Signed)
MD notified of EKG results. Placed in chart

## 2022-06-03 NOTE — Progress Notes (Addendum)
Electrophysiology Rounding Note  Patient Name: Rachael [name] Date of Encounter: 06/03/2022  Primary Cardiologist: None  Electrophysiologist: Lewayne Bunting, MD    Subjective   Pt remains in afib on Tikosyn 250 mcg BID   QTc from EKG last pm shows stable QTc at 460  The patient is doing well today. States she is ready for her procedure. At this time, the patient denies chest pain, shortness of breath, or any new concerns.  Inpatient Medications    Scheduled Meds:  allopurinol  100 mg Oral Daily   apixaban  5 mg Oral BID   atorvastatin  40 mg Oral QPM   dapagliflozin propanediol  10 mg Oral QAC breakfast   docusate sodium  100 mg Oral BID   dofetilide  250 mcg Oral BID   levothyroxine  112 mcg Oral QAC breakfast   Nintedanib  150 mg Oral BID   oxybutynin  10 mg Oral QHS   polyethylene glycol  17 g Oral Daily   sodium chloride flush  3 mL Intravenous Q12H   Continuous Infusions:  sodium chloride     sodium chloride 20 mL/hr at 06/02/22 1703   PRN Meds: sodium chloride, albuterol, furosemide, sodium chloride flush   Vital Signs    Vitals:   06/02/22 0856 06/02/22 1657 06/02/22 2028 06/03/22 0408  BP: 91/64 109/67 113/72 94/66  Pulse: 89 70 91 83  Resp: 18 18 20 18   Temp: 98.2 F (36.8 C) 97.9 F (36.6 C) 98 F (36.7 C) 97.7 F (36.5 C)  TempSrc: Oral Oral Oral Oral  SpO2: 96% 97% 95% 94%  Weight:      Height:        Intake/Output Summary (Last 24 hours) at 06/03/2022 0729 Last data filed at 06/03/2022 0217 Gross per 24 hour  Intake 280.97 ml  Output --  Net 280.97 ml   Filed Weights   06/01/22 1303  Weight: 88.6 kg    Physical Exam    GEN- NAD, A&O x 3. Normal affect.  Lungs- CTAB, Normal effort.  Heart- Irregularly irregular rate and rhythm. No M/G/R GI- Soft, NT, ND Extremities- No clubbing, cyanosis, or edema Skin- no rash or lesion  Labs    CBC No results for input(s): "WBC", "NEUTROABS", "HGB", "HCT", "MCV", "PLT" in the last 72  hours. Basic Metabolic Panel Recent Labs    16/10/96 0152 06/03/22 0302  NA 136 138  K 3.5 4.0  CL 106 106  CO2 22 24  GLUCOSE 99 94  BUN 19 14  CREATININE 1.13* 0.94  CALCIUM 9.1 9.1  MG 2.3 2.1    Telemetry    Atrial fibrillation with rates 60-70's, one episode of RVR into 130's (personally reviewed)  Patient Profile     Rachael [name] is a 89 y.o. female with a past medical history significant for persistent atrial fibrillation (initially dx in 02/2022), HLD, HTN, HFpEF, carotid stenosis, sinus node dysfunction.  Additional hx includes OSA, pulmonary fibrosis, hypothyroidism. They were admitted for tikosyn load. She previously had a successful TEE/DCCV on 04/28/22 with ERAF. She is on eliquis 5mg  BID for CHADSVASC score of 6.   Assessment & Plan    Persistent atrial fibrillation Pt remains in afib on Tikosyn 250 mcg BID  -Continue Eliquis -pending DCCV Creatinine, ser  0.94 (05/23 0302) Magnesium  2.1 (05/23 0302) Potassium4.0 (05/23 0302) No electrolyte supplementation needed  If pt does not convert chemically, plan on DCCV Thursday  at 1130 am.    For  questions or updates, please contact [facility] Please consult [email or web address] for contact info under Cardiology/STEMI.  Signed, Canary Brim, MSN, APRN, NP-C, AGACNP-BC [facility] - Electrophysiology  06/03/2022, 7:34 AM  EP Attending  The patient remains in atrial fib. Her exam reveals an IRIR rhythm with clear lungs and no edema. Tele with an acceptable QT. She will undergo DCCV later today with plans for DC home in the morning after her last dose of dofetilide  Rachael [name]

## 2022-06-03 NOTE — Progress Notes (Signed)
Review of telemetry with multiple episodes of WCT (monomorphic) with ventricular rates in the 140-150s.  Self resolving.  Some episodes initiated with PVC.  EKG was not obtained during the events.  Discussed with patient, and she stated she has been asymptomatic all day.  Will hold PM Tikosyn.  No BB ordered.  K and Mag adequate.  Personally calculated Qtc (Bazette) 421 ms at 55bpm.  If tachycardia recurs, need to obtain 12-lead EKG.  Marya Amsler, MD Cardiology Fellow

## 2022-06-03 NOTE — Interval H&P Note (Signed)
History and Physical Interval Note:  06/03/2022 10:44 AM  [name]  has presented today for surgery, with the diagnosis of AFIB.  The various methods of treatment have been discussed with the patient and family. After consideration of risks, benefits and other options for treatment, the patient has consented to  Procedure(s): CARDIOVERSION (N/A) as a surgical intervention.  The patient's history has been reviewed, patient examined, no change in status, stable for surgery.  I have reviewed the patient's chart and labs.  Questions were answered to the patient's satisfaction.     Rachael [name]

## 2022-06-03 NOTE — Progress Notes (Signed)
12 Lead was not done, transport is here for Cardioversion. Spoke to Fisher in holding regarding the need for a 12Lead.   Will be done prior to cardioversion.

## 2022-06-03 NOTE — Care Management (Signed)
1340 06-03-22 Patient presented for Tikosyn Load. Case Manager spoke with the patient regarding co pay cost. Patient is not agreeable to cost and wants to use [facility]. Patient would like to have the initial Rx filled via [facility] and the Rx refills 90 day supply escribed to [facility] [address] [place], [place], [place] [address]. No further needs identified at this time.

## 2022-06-03 NOTE — Progress Notes (Signed)
Pharmacy: Dofetilide (Tikosyn) - Follow Up Assessment and Electrolyte Replacement  Pharmacy consulted to assist in monitoring and replacing electrolytes in this 89 y.o. female admitted on 06/01/2022 undergoing dofetilide initiation.   Labs:    Component Value Date/Time   K 4.0 06/03/2022 0302   K 4.1 10/15/2015 0943   MG 2.1 06/03/2022 0302     Plan: Potassium: K >/= 4: No additional supplementation needed  Magnesium: Mg > 2: No additional supplementation needed    Thank you for allowing pharmacy to participate in this patient's care   Harland German, PharmD Clinical Pharmacist **Pharmacist phone directory can now be found on [email or web address] ([email or web address]).  Listed under [facility].

## 2022-06-03 NOTE — Anesthesia Preprocedure Evaluation (Signed)
Anesthesia Evaluation  Patient identified by MRN, date of birth, ID band Patient awake    Reviewed: Allergy & Precautions, NPO status , Patient's Chart, lab work & pertinent test results, reviewed documented beta blocker date and time   History of Anesthesia Complications Negative for: history of anesthetic complications  Airway Mallampati: II  TM Distance: >3 FB Neck ROM: Full    Dental  (+) Caps, Dental Advisory Given   Pulmonary shortness of breath and with exertion, COPD,  COPD inhaler   breath sounds clear to auscultation       Cardiovascular hypertension, Pt. on medications and Pt. on home beta blockers (-) angina + Peripheral Vascular Disease and +CHF  (-) CAD + dysrhythmias Atrial Fibrillation  Rhythm:Irregular Rate:Normal  '23 stress: Myocardial perfusion is normal. Normal left ventricle.  LV rest volume 109 ml.  LV stress volume 128 ml. TID ratio 1.17. Overall LV systolic function is mildly abnormal without regional wall motion abnormalities. Stress LV EF: 44%.  Global hypokinesis of the left ventricle.  '22 ECHO: EF 60-65%. LV EF 63 %. The left ventricle has normal function, no regional wall motion abnormalities. There is mild LVH. Grade I diastolic dysfunction (impaired relaxation). RVF is normal. The right ventricular size is normal. There is normal pulmonary artery systolic pressure, trivial MR      Neuro/Psych  Headaches  Anxiety     TIA   GI/Hepatic Neg liver ROS,GERD  Medicated and Controlled,,  Endo/Other  Hypothyroidism  Hx/o breast Ca  Renal/GU Renal InsufficiencyRenal disease  negative genitourinary   Musculoskeletal  (+) Arthritis , Osteoarthritis,    Abdominal  (+) + obese  Peds  Hematology  (+) Blood dyscrasia, anemia eliquis   Anesthesia Other Findings   Reproductive/Obstetrics                              Anesthesia Physical Anesthesia Plan  ASA: 3  Anesthesia  Plan: General   Post-op Pain Management: Minimal or no pain anticipated   Induction:   PONV Risk Score and Plan: 3 and Treatment may vary due to age or medical condition and Propofol infusion  Airway Management Planned: Natural Airway and Mask  Additional Equipment: None  Intra-op Plan:   Post-operative Plan:   Informed Consent: I have reviewed the patients History and Physical, chart, labs and discussed the procedure including the risks, benefits and alternatives for the proposed anesthesia with the patient or authorized representative who has indicated his/her understanding and acceptance.     Dental advisory given  Plan Discussed with: CRNA and Anesthesiologist  Anesthesia Plan Comments:         Anesthesia Quick Evaluation

## 2022-06-03 NOTE — Anesthesia Procedure Notes (Signed)
Procedure Name: General with mask airway Date/Time: 06/03/2022 10:50 AM  Performed by: Cy Blamer, CRNAPre-anesthesia Checklist: Patient identified, Emergency Drugs available, Suction available, Patient being monitored and Timeout performed Patient Re-evaluated:Patient Re-evaluated prior to induction Oxygen Delivery Method: Nasal cannula Placement Confirmation: positive ETCO2 Dental Injury: Teeth and Oropharynx as per pre-operative assessment

## 2022-06-03 NOTE — Anesthesia Postprocedure Evaluation (Signed)
Anesthesia Post Note  Patient: Rachael [name]  Procedure(s) Performed: CARDIOVERSION     Patient location during evaluation: PACU Anesthesia Type: General Level of consciousness: awake and alert and oriented Pain management: pain level controlled Vital Signs Assessment: post-procedure vital signs reviewed and stable Respiratory status: spontaneous breathing, nonlabored ventilation and respiratory function stable Cardiovascular status: blood pressure returned to baseline and stable Postop Assessment: no apparent nausea or vomiting Anesthetic complications: no   No notable events documented.  Last Vitals:  Vitals:   06/03/22 1057 06/03/22 1058  BP:  (!) 111/58  Pulse: (!) 47 (!) 49  Resp: (!) 28 (!) 24  Temp:    SpO2: 97% 97%    Last Pain:  Vitals:   06/03/22 1058  TempSrc:   PainSc: 0-No pain                 Rachael [name] A.

## 2022-06-03 NOTE — CV Procedure (Signed)
Procedure:   DCCV  Indication:  Symptomatic atrial fibrillation  Procedure Note:  The patient signed informed consent.  They have had had therapeutic anticoagulation with apixaban greater than 3 weeks.  Anesthesia was administered by Dr. Malen Gauze.  Patient received 50 mg IV lidocaine and 50 mg IV propofol.Adequate airway was maintained throughout and vital followed per protocol.  They were cardioverted x 1 with 120J of biphasic synchronized energy.  She initially was sinus with intermittent ectopy (both PAC and PVC), then brief junctional rhythm before stabilizing in sinus bradycardia around 45-50 bpm. Several minutes after cardioversion, she had a short period of sinus bradycardia in the 20s. She was asymptomatic. Inpatient EP team made aware. There were no apparent complications.  The patient had normal neuro status and respiratory status post procedure with vitals stable as recorded elsewhere.    Follow up:  They will continue on current medical therapy and follow up with cardiology as scheduled.  Jodelle Red, MD PhD 06/03/2022 11:06 AM

## 2022-06-03 NOTE — Transfer of Care (Signed)
Immediate Anesthesia Transfer of Care Note  Patient: [name]  Procedure(s) Performed: CARDIOVERSION  Patient Location: Cath Lab  Anesthesia Type:General  Level of Consciousness: awake, alert , and oriented  Airway & Oxygen Therapy: Patient Spontanous Breathing and Patient connected to nasal cannula oxygen  Post-op Assessment: Report given to RN, Post -op Vital signs reviewed and stable, Patient moving all extremities X 4, and Patient able to stick tongue midline  Post vital signs: Reviewed  Last Vitals:  Vitals Value Taken Time  BP 118/64 06/03/22 1056  Temp 98.6   Pulse 47 06/03/22 1057  Resp 28 06/03/22 1057  SpO2 97 % 06/03/22 1057    Last Pain:  Vitals:   06/03/22 0750  TempSrc: Oral  PainSc: 0-No pain      Patients Stated Pain Goal: 0 (06/02/22 2020)  Complications: No notable events documented.

## 2022-06-03 NOTE — Progress Notes (Addendum)
Spoke with CCMD that confirmed patient has had multiple pauses with the longest being 4.14 seconds. Also appears patient may have been in vtach from 1710-1714. Notified Cards Fellow  Received orders for labs, EKG, and to hold tonight's dose of Tikosyn

## 2022-06-03 NOTE — Progress Notes (Signed)
Morning EKG reviewed     Shows is in NSR s/p DCC at 45 bpm with stable QTc at ~400-420 ms due to bradycardia.  QT is ~440-460. Will follow rates and QT overnight.  Continue  Tikosyn 250 mcg BID.   Potassium4.0 (05/23 0302) Magnesium  2.1 (05/23 0302) Creatinine, ser  0.94 (05/23 0302)  Plan for home Friday if QTc remains stable   Graciella Freer, [place]  06/03/2022 5:55 PM

## 2022-06-04 ENCOUNTER — Encounter (HOSPITAL_COMMUNITY): Payer: Self-pay | Admitting: Cardiology

## 2022-06-04 DIAGNOSIS — I4819 Other persistent atrial fibrillation: Secondary | ICD-10-CM | POA: Diagnosis not present

## 2022-06-04 LAB — BASIC METABOLIC PANEL
Anion gap: 10 (ref 5–15)
BUN: 11 mg/dL (ref 8–23)
CO2: 21 mmol/L — ABNORMAL LOW (ref 22–32)
Calcium: 8.8 mg/dL — ABNORMAL LOW (ref 8.9–10.3)
Chloride: 106 mmol/L (ref 98–111)
Creatinine, Ser: 0.95 mg/dL (ref 0.44–1.00)
GFR, Estimated: 58 mL/min — ABNORMAL LOW (ref >60)
Glucose, Bld: 92 mg/dL (ref 70–99)
Potassium: 3.6 mmol/L (ref 3.5–5.1)
Sodium: 137 mmol/L (ref 135–145)

## 2022-06-04 LAB — MAGNESIUM: Magnesium: 1.9 mg/dL (ref 1.7–2.4)

## 2022-06-04 MED ORDER — DOFETILIDE 250 MCG PO CAPS
250.0000 ug | ORAL_CAPSULE | Freq: Two times a day (BID) | ORAL | Status: DC
  Administered 2022-06-04 – 2022-06-05 (×3): 250 ug via ORAL
  Filled 2022-06-04 (×3): qty 1

## 2022-06-04 MED ORDER — POTASSIUM CHLORIDE CRYS ER 20 MEQ PO TBCR
60.0000 meq | EXTENDED_RELEASE_TABLET | ORAL | Status: AC
  Administered 2022-06-04: 60 meq via ORAL
  Filled 2022-06-04: qty 3

## 2022-06-04 MED ORDER — MAGNESIUM SULFATE 2 GM/50ML IV SOLN
2.0000 g | Freq: Once | INTRAVENOUS | Status: AC
  Administered 2022-06-04: 2 g via INTRAVENOUS
  Filled 2022-06-04: qty 50

## 2022-06-04 NOTE — Care Management Important Message (Signed)
Important Message  Patient Details  Name: Rachael [name] MRN: [id number] Date of Birth: 02-04-1933   Medicare Important Message Given:  Yes     Sherilyn Banker 06/04/2022, 3:23 PM

## 2022-06-04 NOTE — Progress Notes (Signed)
Pharmacy: Dofetilide (Tikosyn) - Follow Up Assessment and Electrolyte Replacement  Pharmacy consulted to assist in monitoring and replacing electrolytes in this 89 y.o. female admitted on 06/01/2022 undergoing dofetilide initiation.   Labs:    Component Value Date/Time   K 3.6 06/04/2022 0314   K 4.1 10/15/2015 0943   MG 1.9 06/04/2022 0314     Plan: Potassium: K 3.5-3.7:  Give KCl 60 mEq po x1   Magnesium: Mg 1.8-2: Give Mg 2 gm IV x1    As patient has required on average 2 doses of KCL and Mag while in patient. She may need MagOx 400mg  bid and KCL qd at dc - evaluate am labs    [employer] Pharm.D. CPP, BCPS Clinical Pharmacist [phone] 06/04/2022 2:02 PM

## 2022-06-04 NOTE — Progress Notes (Signed)
While ambulating in the room with assistance, patient's HR was 150.Short runs of SVT noticed.   BP stable:144/60  (82)  Denies any nausea, or dizziness.

## 2022-06-04 NOTE — Progress Notes (Addendum)
Electrophysiology Rounding Note  Patient Name: Rachael [name] Date of Encounter: 06/04/2022  Primary Cardiologist: None  Electrophysiologist: Lewayne Bunting, MD    Subjective   Pt remains in NSR on Tikosyn 250 mcg BID   Dose #5 held last night in the setting of WCT. In our review more consistent with AT/SVT.  The patient is doing well today.  Frustrated that length of stay will be increased. At this time, the patient denies chest pain, shortness of breath, or any new concerns.  Inpatient Medications    Scheduled Meds:  allopurinol  100 mg Oral Daily   apixaban  5 mg Oral BID   atorvastatin  40 mg Oral QPM   dapagliflozin propanediol  10 mg Oral QAC breakfast   docusate sodium  100 mg Oral BID   dofetilide  250 mcg Oral BID   levothyroxine  112 mcg Oral QAC breakfast   Nintedanib  150 mg Oral BID   oxybutynin  10 mg Oral QHS   polyethylene glycol  17 g Oral Daily   sodium chloride flush  3 mL Intravenous Q12H   Continuous Infusions:  sodium chloride     PRN Meds: sodium chloride, albuterol, furosemide, sodium chloride flush   Vital Signs    Vitals:   06/03/22 1950 06/04/22 0318 06/04/22 0700 06/04/22 0735  BP: 114/74 (!) 98/52 113/60   Pulse: (!) 50 (!) 48 (!) 43 (!) 40  Resp: 18     Temp: 98.7 F (37.1 C) 98.5 F (36.9 C)    TempSrc: Oral Oral    SpO2: 98% 98% 95% 95%  Weight:      Height:        Intake/Output Summary (Last 24 hours) at 06/04/2022 0801 Last data filed at 06/03/2022 2145 Gross per 24 hour  Intake 480 ml  Output 0 ml  Net 480 ml   Filed Weights   06/01/22 1303 06/03/22 1031  Weight: 88.6 kg 88.5 kg    Physical Exam    GEN- NAD, A&O x 3. Normal affect.  Lungs- CTAB, Normal effort.  Heart-  Slow but regular  rate and rhythm. No M/G/R GI- Soft, NT, ND Extremities- No clubbing, cyanosis, or edema Skin- no rash or lesion  Labs    CBC No results for input(s): "WBC", "NEUTROABS", "HGB", "HCT", "MCV", "PLT" in the last 72  hours. Basic Metabolic Panel Recent Labs    16/10/96 2055 06/04/22 0314  NA 137 137  K 4.1 3.6  CL 103 106  CO2 24 21*  GLUCOSE 110* 92  BUN 12 11  CREATININE 1.05* 0.95  CALCIUM 9.2 8.8*  MG 2.0 1.9    Telemetry    SB/ NSR 40-60s.  WCT overnight reviewed with MD. Rates ranges from 110-130s with periods suggestive of AV wenckebach with grouped beating and A-V synchrony as well as narrowing and widening at the same rate more consistent with aberrancy (personally reviewed)  Patient Profile     Rachael [name] is a 89 y.o. female with a past medical history significant for persistent atrial fibrillation.  They were admitted for tikosyn load.   Assessment & Plan    Persistent atrial fibrillation Pt remains in NSR  Resume Tikosyn 250 mcg BID this am.  Continue Eliquis Creatinine, ser  0.95 (05/24 0314) Magnesium  1.9 (05/24 0314) Potassium3.6 (05/24 0314) Supplement both K and Mg per protocol  Plan for home first thing Saturday if QTc remains stable, as Dose #6 will be this evening.  For questions or updates, please contact [facility] Please consult [email or web address] for contact info under Cardiology/STEMI.  Signed, Graciella Freer, PA-C  06/04/2022, 8:01 AM   ATTENDING ATTESTATION  I have seen and examined this patient with the APP.  I have reviewed the attached note and agree with its content unless detailed differently below.    GEN: Well nourished, well developed, in no acute distress  Cardiac: RRR; no murmurs, rubs, or gallops, no edema  Respiratory:  normal work of breathing  Persistent atrial fibrillation In sinus rhythm today Tach appears to have been supraventricular - certainly was not TdP Continue Tikosyn Continue Eliquis Electrolytes reviewed   York Pellant MD 06/04/2022 5:53 PM

## 2022-06-04 NOTE — Care Management (Signed)
06-04-22 1610. [facility] Pharmacy will be open Saturday until 1400 for the initial Rx for Tikosyn. No further needs at this time.

## 2022-06-04 NOTE — Discharge Summary (Incomplete)
ELECTROPHYSIOLOGY PROCEDURE DISCHARGE SUMMARY    Patient ID: Rachael [name],  MRN: [id number], DOB/AGE: [birth year] 89 y.o.  Admit date: 06/01/2022 Discharge date: 06/05/2022  Primary Care Physician: Nelwyn Salisbury, MD  Primary Cardiologist: None  Electrophysiologist: Dr. Ladona Ridgel   Primary Discharge Diagnosis:  Persistent atrial fibrillation status post Tikosyn loading this admission  Secondary Discharge Diagnosis:  Atrial tachycardia / SVT  Allergies  Allergen Reactions   Shellfish Allergy Anaphylaxis and Hives    Hives all over the body.    Percocet [Oxycodone-Acetaminophen] Hives   Iodinated Contrast Media Other (See Comments)    PT IS NOT AWARE OF IODINE ALLERGY, PREMEDICATED FOR PRIOR PREMEDS ONLY//A.C. - unknown reaction   Iodine Hives     Procedures This Admission:  1.  Tikosyn loading  2.  Direct current cardioversion on Thursday Jun 03, 2022 by Dr Cristal Deer which successfully restored SR.  There were no early apparent complications.   Brief HPI: Rachael [name] is a 89 y.o. female with a past medical history as noted above.  They were referred to EP for treatment options of atrial fibrillation.  Risks, benefits, and alternatives to Tikosyn were reviewed with the patient who wished to proceed with admission for loading.  Hospital Course:  The patient was admitted and Tikosyn was initiated.  Renal function and electrolytes were followed during the hospitalization.  Their QTc remained stable. On 5/23 they underwent direct current cardioversion which restored sinus rhythm. The patients QTc remained stable. Evening of 5/23 pt had a wide complex tachycardia and dose of tikosyn was held.  Review of telemetry showed most likely atrial tachycardia and tikosyn was resumed am of 5/24 for doses #5 and 6.  They were monitored on telemetry up to discharge. On the day of discharge, they were examined by Dr. Graciela Husbands  who considered them stable for discharge to home.  She was noted to  have bradycardia on tele. However, this is a longstanding problem for her and no further interventions were needed. Follow-up has been arranged with the Atrial Fibrillation clinic in approximately 1 week (06/14/22). The patient had a K+ of 3.6 yesterday. K+ was supplemented and it was 4.1 today. She will be sent home on K+ 10 mEq once daily. Her Mg2+ was 1.9. This was reviewed by Dr. Graciela Husbands. She is not felt to need Mg2+ supplementation at this time. She will need a repeat BMET, Mg2+ early next week. She has been scheduled for labs on 5/28 at the [facility] office.   Physical Exam: Vitals:   06/04/22 1557 06/04/22 2239 06/05/22 0219 06/05/22 0845  BP: 134/76 102/61 100/60 117/62  Pulse:  (!) 51 (!) 49 (!) 41  Resp: 18 18 16 18   Temp: 98.1 F (36.7 C) 98.1 F (36.7 C)  98.3 F (36.8 C)  TempSrc: Oral Oral  Oral  SpO2: 100% 98% 98% 98%  Weight:      Height:        GEN- NAD, A&O x 3. Normal affect.  Lungs- CTAB, Normal effort.  Heart- Regular rate and rhythm. No M/G/R GI- Soft, NT, ND Extremities- No clubbing, cyanosis, or edema Skin- no rash or lesion  Labs:   Lab Results  Component Value Date   WBC 8.8 03/01/2022   HGB 13.6 04/28/2022   HCT 40.0 04/28/2022   MCV 88.8 03/01/2022   PLT 321 03/01/2022    Recent Labs  Lab 06/05/22 0157  NA 139  K 4.1  CL 109  CO2 22  BUN 12  CREATININE 0.98  CALCIUM 8.9  GLUCOSE 92    Discharge Medications:  Allergies as of 06/05/2022       Reactions   Shellfish Allergy Anaphylaxis, Hives   Hives all over the body.   Percocet [oxycodone-acetaminophen] Hives   Iodinated Contrast Media Other (See Comments)   PT IS NOT AWARE OF IODINE ALLERGY, PREMEDICATED FOR PRIOR PREMEDS ONLY//A.C. - unknown reaction   Iodine Hives        Medication List     STOP taking these medications    ondansetron 4 MG disintegrating tablet Commonly known as: ZOFRAN-ODT       TAKE these medications    albuterol 108 (90 Base) MCG/ACT  inhaler Commonly known as: VENTOLIN HFA Inhale 2 puffs into the lungs every 4 (four) hours as needed for wheezing or shortness of breath.   allopurinol 100 MG tablet Commonly known as: ZYLOPRIM Take 1 tablet (100 mg total) by mouth daily.   apixaban 5 MG Tabs tablet Commonly known as: Eliquis Take 1 tablet (5 mg total) by mouth 2 (two) times daily.   atorvastatin 40 MG tablet Commonly known as: LIPITOR Take 40 mg by mouth every evening.   CALCIUM 600 PO Take 600 mg by mouth 2 (two) times daily.   Cholecalciferol 50 MCG (2000 UT) Caps Take 2,000 Units by mouth in the morning and at bedtime.   dapagliflozin propanediol 10 MG Tabs tablet Commonly known as: Farxiga Take 1 tablet (10 mg total) by mouth daily before breakfast.   dofetilide 250 MCG capsule Commonly known as: TIKOSYN Take 1 capsule (250 mcg total) by mouth 2 (two) times daily.   furosemide 20 MG tablet Commonly known as: LASIX Take 1 tablet (20 mg total) by mouth daily as needed for fluid. Take one tablet once a day   levothyroxine 112 MCG tablet Commonly known as: SYNTHROID Take 1 tablet (112 mcg total) by mouth daily before breakfast.   LORazepam 0.5 MG tablet Commonly known as: ATIVAN Take 0.5 mg by mouth every 8 (eight) hours as needed for anxiety.   metoprolol succinate 25 MG 24 hr tablet Commonly known as: TOPROL-XL Take 1 tablet (25 mg total) by mouth daily as needed (If Bp is over 110/60). Hold if systolic blood pressure (top number) less than 100 mmHg or pulse less than 60 bpm.   Ofev 150 MG Caps Generic drug: Nintedanib Take 1 capsule (150 mg total) by mouth 2 (two) times daily.   OVER THE COUNTER MEDICATION Take 2 capsules by mouth daily. Juice Plus Berry Blend   oxybutynin 10 MG 24 hr tablet Commonly known as: DITROPAN-XL Take 10 mg by mouth at bedtime.        Disposition:  Discharge Instructions     Diet - low sodium heart healthy   Complete by: As directed    Increase activity  slowly   Complete by: As directed        Follow-up Information     [facility] Follow up.   Specialty: Cardiology Why: on 6/3 at 3 pm for post tikosyn follow up Contact information: [address] [id number] [address]c [place] [address] [phone]                Duration of Discharge Encounter: Greater than 30 minutes including physician time.  Signed, Tereso Newcomer, PA-C  06/05/2022 10:06 AM

## 2022-06-04 NOTE — Progress Notes (Signed)
Mobility Specialist Progress Note:   06/04/22 1545  Mobility  Activity Ambulated with assistance in hallway  Level of Assistance Standby assist, set-up cues, supervision of patient - no hands on  Assistive Device Four wheel walker  Distance Ambulated (ft) 350 ft  Activity Response Tolerated well  Mobility Referral Yes  $Mobility charge 1 Mobility  Mobility Specialist Start Time (ACUTE ONLY) 1545  Mobility Specialist Stop Time (ACUTE ONLY) 1553  Mobility Specialist Time Calculation (min) (ACUTE ONLY) 8 min   Pt eager for mobility session. Required no physical assistance throughout. HR peaked at 117bpm, pt with no c/o throughout. Back in chair with all needs met.   Addison Lank Mobility Specialist Please contact via SecureChat or  Rehab office at [phone]

## 2022-06-04 NOTE — Progress Notes (Signed)
Morning EKG reviewed     Shows remains in NSR at 50 bpm with stable QTc at 440 ms.  Continue  Tikosyn 250 mcg BID.   Potassium3.6 (05/24 0314) Magnesium  1.9 (05/24 0314) Creatinine, ser  0.95 (05/24 0314)  Plan for home Saturday if QTc remains stable. Final dose tonight.   Confirmed with case management that [facility] pharmacy is open from 1000 to 1400 tomorrow.   Graciella Freer, PA-C  06/04/2022 1:00 PM

## 2022-06-05 ENCOUNTER — Other Ambulatory Visit: Payer: Self-pay | Admitting: Physician Assistant

## 2022-06-05 ENCOUNTER — Other Ambulatory Visit (HOSPITAL_COMMUNITY): Payer: Self-pay

## 2022-06-05 DIAGNOSIS — I4819 Other persistent atrial fibrillation: Secondary | ICD-10-CM

## 2022-06-05 DIAGNOSIS — Z79899 Other long term (current) drug therapy: Secondary | ICD-10-CM

## 2022-06-05 LAB — BASIC METABOLIC PANEL
Anion gap: 8 (ref 5–15)
BUN: 12 mg/dL (ref 8–23)
CO2: 22 mmol/L (ref 22–32)
Calcium: 8.9 mg/dL (ref 8.9–10.3)
Chloride: 109 mmol/L (ref 98–111)
Creatinine, Ser: 0.98 mg/dL (ref 0.44–1.00)
GFR, Estimated: 56 mL/min — ABNORMAL LOW (ref >60)
Glucose, Bld: 92 mg/dL (ref 70–99)
Potassium: 4.1 mmol/L (ref 3.5–5.1)
Sodium: 139 mmol/L (ref 135–145)

## 2022-06-05 LAB — MAGNESIUM: Magnesium: 1.9 mg/dL (ref 1.7–2.4)

## 2022-06-05 MED ORDER — POTASSIUM CHLORIDE CRYS ER 10 MEQ PO TBCR
10.0000 meq | EXTENDED_RELEASE_TABLET | Freq: Every day | ORAL | 3 refills | Status: DC
  Filled 2022-06-05: qty 90, 90d supply, fill #0

## 2022-06-05 MED ORDER — METOPROLOL SUCCINATE ER 25 MG PO TB24: 25.0000 mg | ORAL_TABLET | Freq: Every day | ORAL | Status: DC | PRN

## 2022-06-05 MED ORDER — DOFETILIDE 250 MCG PO CAPS
250.0000 ug | ORAL_CAPSULE | Freq: Two times a day (BID) | ORAL | 11 refills | Status: DC
  Filled 2022-06-05: qty 60, 30d supply, fill #0

## 2022-06-05 NOTE — Discharge Instructions (Addendum)
Go to [facility] at [address] on Tuesday, May 28 for a lab test (BMET).  Do not take Ondansetron (Zofran).  If you take Furosemide (Lasix) for swelling, eat something with potassium (like a banana) that day. Do not take furosemide more than once without calling [facility] [phone]).

## 2022-06-05 NOTE — Progress Notes (Signed)
  Patient heart rate observed to be droping int to the 30s and 40.Responded to alarms by checking on the patient. The HR up in the 50s. This pattern repeated and the on-call cardiologist was paged and notified as ordered for HR<60. The provider requested to know if patient was awake, asleep or in any activity. This RN went again to the room to check in order to provide an accurate response to the doctor. Pt was observed to be asleep and the Dr was informed. Subsequently patient monitor shows asystole and the patient check was done again.At [facility] time the doctor was still on the phone and charge nurse rushed in too in response to the alert. This RN asked pt 'are u okay? And pt responded 'yes', at which point the monitor was showing bradycardia.Still on phone, the doctor was notified of the low HR, and informed this RN to have day shift hold am dose of tikosyn until further evaluation. At this point, this RN left the room and turn off the light for patient to conigntinue her sleep.

## 2022-06-05 NOTE — Progress Notes (Addendum)
Pharmacy: Dofetilide (Tikosyn) - Follow Up Assessment and Electrolyte Replacement  Pharmacy consulted to assist in monitoring and replacing electrolytes in this 89 y.o. female admitted on 06/01/2022 undergoing dofetilide initiation.   Labs:    Component Value Date/Time   K 4.1 06/05/2022 0157   K 4.1 10/15/2015 0943   MG 1.9 06/05/2022 0157     Plan: Patient has required on average 2 doses of KCL and Mag while in patient. Consider initiating MagOx 400mg  bid and KCL qd on discharge.  Thank you for involving pharmacy in this patient's care.   Rockwell Alexandria, PharmD PGY1 Pharmacy Resident 06/05/2022 11:09 AM

## 2022-06-05 NOTE — Progress Notes (Signed)
   Electrophysiology Rounding Note  Patient Name: Rachael [name] Date of Encounter: 06/05/2022  Primary Cardiologist: None  Electrophysiologist: Lewayne Bunting, MD    Subjective   Pt remains in NSR on Tikosyn 250 mcg BID  Feeling better ready to go home  Inpatient Medications    Scheduled Meds:  allopurinol  100 mg Oral Daily   apixaban  5 mg Oral BID   atorvastatin  40 mg Oral QPM   dapagliflozin propanediol  10 mg Oral QAC breakfast   docusate sodium  100 mg Oral BID   dofetilide  250 mcg Oral BID   levothyroxine  112 mcg Oral QAC breakfast   Nintedanib  150 mg Oral BID   oxybutynin  10 mg Oral QHS   polyethylene glycol  17 g Oral Daily   sodium chloride flush  3 mL Intravenous Q12H   Continuous Infusions:  sodium chloride     PRN Meds: sodium chloride, albuterol, sodium chloride flush   Vital Signs    Vitals:   06/04/22 0807 06/04/22 1557 06/04/22 2239 06/05/22 0219  BP:  134/76 102/61 100/60  Pulse: (!) 42  (!) 51 (!) 49  Resp:  18 18 16   Temp:  98.1 F (36.7 C) 98.1 F (36.7 C)   TempSrc:  Oral Oral   SpO2: 100% 100% 98% 98%  Weight:      Height:        Intake/Output Summary (Last 24 hours) at 06/05/2022 0845 Last data filed at 06/04/2022 1610 Gross per 24 hour  Intake 49.97 ml  Output --  Net 49.97 ml    Filed Weights   06/01/22 1303 06/03/22 1031  Weight: 88.6 kg 88.5 kg    Physical Exam    Well developed and nourished in no acute distress HENT normal Neck supple with JVP-  flat   Clear Regular rate and rhythm, no murmurs or gallops Abd-soft with active BS No Clubbing cyanosis edema Skin-warm and dry A & Oriented  Grossly normal sensory and motor function   ECG  QTc about 470 msec Labs    CBC No results for input(s): "WBC", "NEUTROABS", "HGB", "HCT", "MCV", "PLT" in the last 72 hours. Basic Metabolic Panel Recent Labs    96/04/54 0314 06/05/22 0157  NA 137 139  K 3.6 4.1  CL 106 109  CO2 21* 22  GLUCOSE 92 92  BUN 11 12   CREATININE 0.95 0.98  CALCIUM 8.8* 8.9  MG 1.9 1.9     Telemetry    SB/ NSR 40-60s.  WCT overnight reviewed with MD. Rates ranges from 110-130s with periods suggestive of AV wenckebach with grouped beating and A-V synchrony as well as narrowing and widening at the same rate more consistent with aberrancy (personally reviewed)  Patient Profile     Rachael [name] is a 89 y.o. female with a past medical history significant for persistent atrial fibrillation.  They were admitted for tikosyn load.   Assessment & Plan    Persistent atrial fibrillation Pt remains in NSR  Resume Tikosyn 250 mcg BID this am.  Continue Eliquis Creatinine, ser  0.98 (05/25 0157) Magnesium  1.9 (05/25 0157) Potassium4.1 (05/25 0157) Supplement both K and Mg per protocol \   Will need early BMET ( next week) as the levels have been variable) Okay for discharge     Sherryl Manges, MD  06/05/2022, 8:45 AM

## 2022-06-08 ENCOUNTER — Ambulatory Visit: Payer: Medicare HMO | Attending: Internal Medicine

## 2022-06-08 ENCOUNTER — Telehealth: Payer: Self-pay

## 2022-06-08 ENCOUNTER — Encounter: Payer: Self-pay | Admitting: Internal Medicine

## 2022-06-08 ENCOUNTER — Ambulatory Visit: Payer: Medicare HMO | Admitting: Family Medicine

## 2022-06-08 DIAGNOSIS — Z79899 Other long term (current) drug therapy: Secondary | ICD-10-CM

## 2022-06-08 DIAGNOSIS — I4819 Other persistent atrial fibrillation: Secondary | ICD-10-CM | POA: Diagnosis not present

## 2022-06-08 DIAGNOSIS — Z5181 Encounter for therapeutic drug level monitoring: Secondary | ICD-10-CM | POA: Diagnosis not present

## 2022-06-08 NOTE — Transitions of Care (Post Inpatient/ED Visit) (Signed)
06/08/2022  Name: Rachael [name] MRN: [id number] DOB: [birth year]  Today's TOC FU Call Status: Today's TOC FU Call Status:: Successful TOC FU Call Competed TOC FU Call Complete Date: 06/08/22 (Incoming call from patient-returning RN CM call.)  Transition Care Management Follow-up Telephone Call Date of Discharge: 06/05/22 Discharge Facility: [facility]) Type of Discharge: Inpatient Admission Primary Inpatient Discharge Diagnosis:: "persistent A-fib" How have you been since you were released from the hospital?: Better (Patient states things "going well, has been able to rest well at night." Appetite fair. LBM today-was having some diarrhea but has resolved.) Any questions or concerns?: No  Items Reviewed: Did you receive and understand the discharge instructions provided?: Yes Medications obtained,verified, and reconciled?: Yes (Medications Reviewed) Any new allergies since your discharge?: No Dietary orders reviewed?: Yes Type of Diet Ordered:: low salt/heart healthy Do you have support at home?: Yes People in Home: child(ren), adult Name of Support/Comfort Primary Source: son lives with pt  Medications Reviewed Today: Medications Reviewed Today     Reviewed by Charlyn Minerva, RN (Registered Nurse) on 06/08/22 at 1410  Med List Status: <None>   Medication Order Taking? Sig Documenting Provider Last Dose Status Informant  albuterol (VENTOLIN HFA) 108 (90 Base) MCG/ACT inhaler [id number] Yes Inhale 2 puffs into the lungs every 4 (four) hours as needed for wheezing or shortness of breath. Nelwyn Salisbury, MD Taking Active Family Member  allopurinol (ZYLOPRIM) 100 MG tablet [id number] Yes Take 1 tablet (100 mg total) by mouth daily. Nelwyn Salisbury, MD Taking Active Family Member  apixaban (ELIQUIS) 5 MG TABS tablet [id number] Yes Take 1 tablet (5 mg total) by mouth 2 (two) times daily. Tessa Lerner, DO Taking Active Family Member  atorvastatin (LIPITOR) 40 MG tablet  [id number] Yes Take 40 mg by mouth every evening. [provider] Taking Active Family Member  Calcium Carbonate (CALCIUM 600 PO) [id number] Yes Take 600 mg by mouth 2 (two) times daily. [provider] Taking Active Family Member  Cholecalciferol 50 MCG (2000 UT) CAPS [id number] Yes Take 2,000 Units by mouth in the morning and at bedtime. [provider] Taking Active Family Member  dapagliflozin propanediol (FARXIGA) 10 MG TABS tablet [id number] Yes Take 1 tablet (10 mg total) by mouth daily before breakfast. Odis Hollingshead, Sunit, DO Taking Active Family Member  dofetilide (TIKOSYN) 250 MCG capsule [id number] Yes Take 1 capsule (250 mcg total) by mouth 2 (two) times daily. Tereso Newcomer T, PA-C Taking Active   furosemide (LASIX) 20 MG tablet [id number] Yes Take 1 tablet (20 mg total) by mouth daily as needed for fluid. Take one tablet once a day Nelwyn Salisbury, MD Taking Active Family Member  levothyroxine (SYNTHROID) 112 MCG tablet [id number] Yes Take 1 tablet (112 mcg total) by mouth daily before breakfast. Nelwyn Salisbury, MD Taking Active Family Member  LORazepam (ATIVAN) 0.5 MG tablet [id number] Yes Take 0.5 mg by mouth every 8 (eight) hours as needed for anxiety. [provider] Taking Active Family Member  metoprolol succinate (TOPROL-XL) 25 MG 24 hr tablet [id number] Yes Take 1 tablet (25 mg total) by mouth daily as needed (If Bp is over 110/60). Hold if systolic blood pressure (top number) less than 100 mmHg or pulse less than 60 bpm. Tereso Newcomer T, PA-C Taking Active   Nintedanib (OFEV) 150 MG CAPS [id number] Yes Take 1 capsule (150 mg total) by mouth 2 (two) times daily. Chilton Greathouse, MD Taking Active Family Member  OVER THE COUNTER MEDICATION [id number] Yes  Take 2 capsules by mouth daily. Juice Plus [employer] [provider] Taking Active Family Member  oxybutynin (DITROPAN-XL) 10 MG 24 hr tablet [id number] Yes Take 10 mg by mouth at bedtime. [provider] Taking Active Family Member  potassium chloride (KLOR-CON M) 10 MEQ tablet [id number] Yes Take 1 tablet (10 mEq total) by mouth daily. Beatrice Lecher, PA-C Taking Active             Home Care and Equipment/Supplies: Were Home Health Services Ordered?: NA Any new equipment or medical supplies ordered?: NA  Functional Questionnaire: Do you need assistance with bathing/showering or dressing?: No Do you need assistance with meal preparation?: No Do you need assistance with eating?: No Do you have difficulty maintaining continence: No Do you need assistance with getting out of bed/getting out of a chair/moving?: No Do you have difficulty managing or taking your medications?: No  Follow up appointments reviewed: PCP Follow-up appointment confirmed?: Yes Date of PCP follow-up appointment?: 06/14/22 (pt already had appt scheduled with provider prior to admission-care guide changed visit type to hosp f/u) Follow-up Provider: Dr. Clent Ridges [facility] Follow-up appointment confirmed?: Yes Date of Specialist follow-up appointment?: 06/14/22 Follow-Up Specialty Provider:: [facility] Do you need transportation to your follow-up appointment?: No (pt confirms her son is able to take her to appts) Do you understand care options if your condition(s) worsen?: Yes-patient verbalized understanding  SDOH Interventions Today    Flowsheet Row Most Recent Value  SDOH Interventions   Food Insecurity Interventions Intervention Not Indicated  Transportation Interventions Intervention Not Indicated      TOC Interventions Today    Flowsheet Row Most Recent Value  TOC Interventions   TOC Interventions Discussed/Reviewed TOC Interventions Discussed, Post discharge activity limitations per provider, Arranged PCP follow up within 7 days/Care Guide scheduled      Interventions Today    Flowsheet Row Most Recent Value  Chronic Disease   Chronic disease during today's visit Atrial  Fibrillation (AFib)  General Interventions   General Interventions Discussed/Reviewed General Interventions Discussed, Doctor Visits  Doctor Visits Discussed/Reviewed Doctor Visits Discussed, Specialist, PCP  PCP/Specialist Visits Compliance with follow-up visit  Education Interventions   Education Provided Provided Education  Provided Verbal Education On Nutrition, When to see the doctor, Medication  Nutrition Interventions   Nutrition Discussed/Reviewed Adding fruits and vegetables, Nutrition Discussed, Decreasing salt  Pharmacy Interventions   Pharmacy Dicussed/Reviewed Pharmacy Topics Discussed, Medications and their functions  Safety Interventions   Safety Discussed/Reviewed Home Safety, Safety Discussed  [pt has already contacted cardiology office to see if she can resume outpt therapy with agency([facility])-states she has made great progress as she was unable to walk in 2018-now walking on her own]       Antionette Fairy, Cathrine Muster [facility] Care Management Care Management Community Coordinator Direct Phone: [phone] Toll Free: [phone] Fax: [phone]

## 2022-06-08 NOTE — Transitions of Care (Post Inpatient/ED Visit) (Signed)
   06/08/2022  Name: Rachael [name] MRN: [id number] DOB: 05-Aug-1933  Today's TOC FU Call Status: Today's TOC FU Call Status:: Unsuccessul Call (1st Attempt) Unsuccessful Call (1st Attempt) Date: 06/08/22  Attempted to reach the patient regarding the most recent Inpatient/ED visit.  Follow Up Plan: Additional outreach attempts will be made to reach the patient to complete the Transitions of Care (Post Inpatient/ED visit) call.     Antionette Fairy, RN,BSN,CCM [facility] Care Management Care Management Community Coordinator Direct Phone: [phone] Toll Free: [phone] Fax: [phone]

## 2022-06-09 ENCOUNTER — Telehealth: Payer: Self-pay | Admitting: *Deleted

## 2022-06-09 DIAGNOSIS — Z79899 Other long term (current) drug therapy: Secondary | ICD-10-CM

## 2022-06-09 DIAGNOSIS — H53483 Generalized contraction of visual field, bilateral: Secondary | ICD-10-CM | POA: Diagnosis not present

## 2022-06-09 LAB — BASIC METABOLIC PANEL
BUN/Creatinine Ratio: 10 — ABNORMAL LOW (ref 12–28)
BUN: 10 mg/dL (ref 8–27)
CO2: 21 mmol/L (ref 20–29)
Calcium: 10.2 mg/dL (ref 8.7–10.3)
Chloride: 104 mmol/L (ref 96–106)
Creatinine, Ser: 0.97 mg/dL (ref 0.57–1.00)
Glucose: 96 mg/dL (ref 70–99)
Potassium: 4.4 mmol/L (ref 3.5–5.2)
Sodium: 141 mmol/L (ref 134–144)
eGFR: 57 mL/min/{1.73_m2} — ABNORMAL LOW (ref >59)

## 2022-06-09 LAB — MAGNESIUM: Magnesium: 1.5 mg/dL — ABNORMAL LOW (ref 1.6–2.3)

## 2022-06-09 MED ORDER — MAGNESIUM OXIDE 400 MG PO TABS: ORAL_TABLET | ORAL | 11 refills | Status: DC

## 2022-06-09 NOTE — Telephone Encounter (Signed)
-----   Message from Beatrice Lecher, [place] sent at 06/09/2022  2:23 PM EDT ----- Mg2+ too low. Creatinine normal. K+ normal.  PLAN:  -Start MgOx 400 mg twice daily x 1 week, then 400 mg once daily. -Repeat BMET, Mg2+ at [facility] f/u next week -I will fwd to J. Nelva Bush, PA-C as Marquis Buggy, PA-C    06/09/2022 2:20 PM

## 2022-06-14 ENCOUNTER — Ambulatory Visit (INDEPENDENT_AMBULATORY_CARE_PROVIDER_SITE_OTHER): Payer: Medicare HMO | Admitting: Family Medicine

## 2022-06-14 ENCOUNTER — Encounter (HOSPITAL_COMMUNITY): Payer: Medicare HMO | Admitting: Internal Medicine

## 2022-06-14 ENCOUNTER — Encounter: Payer: Self-pay | Admitting: Family Medicine

## 2022-06-14 ENCOUNTER — Other Ambulatory Visit: Payer: Self-pay

## 2022-06-14 VITALS — BP 98/58 | HR 50 | Temp 98.0°F | Wt 193.0 lb

## 2022-06-14 DIAGNOSIS — I5031 Acute diastolic (congestive) heart failure: Secondary | ICD-10-CM

## 2022-06-14 DIAGNOSIS — I4819 Other persistent atrial fibrillation: Secondary | ICD-10-CM

## 2022-06-14 DIAGNOSIS — Z79899 Other long term (current) drug therapy: Secondary | ICD-10-CM

## 2022-06-14 DIAGNOSIS — E876 Hypokalemia: Secondary | ICD-10-CM | POA: Diagnosis not present

## 2022-06-14 DIAGNOSIS — Z961 Presence of intraocular lens: Secondary | ICD-10-CM | POA: Diagnosis not present

## 2022-06-14 DIAGNOSIS — I5032 Chronic diastolic (congestive) heart failure: Secondary | ICD-10-CM

## 2022-06-14 DIAGNOSIS — I471 Supraventricular tachycardia, unspecified: Secondary | ICD-10-CM

## 2022-06-14 LAB — BASIC METABOLIC PANEL
BUN: 17 mg/dL (ref 6–23)
CO2: 26 mEq/L (ref 19–32)
Calcium: 9.9 mg/dL (ref 8.4–10.5)
Chloride: 104 mEq/L (ref 96–112)
Creatinine, Ser: 0.96 mg/dL (ref 0.40–1.20)
GFR: 53.57 mL/min — ABNORMAL LOW (ref >60.00)
Glucose, Bld: 89 mg/dL (ref 70–99)
Potassium: 5 mEq/L (ref 3.5–5.1)
Sodium: 140 mEq/L (ref 135–145)

## 2022-06-14 LAB — MAGNESIUM: Magnesium: 1.9 mg/dL (ref 1.5–2.5)

## 2022-06-14 NOTE — Progress Notes (Signed)
   Subjective:    Patient ID: [name], female    DOB: 1933/08/30, 89 y.o.   MRN: [id number]  HPI Here with her son for a transitional care visit after a hospital stay from 06-01-22 to 06-05-22. She was admitted for treatment of atrial fibrillation, and she was loaded with Tikosyn. She then underwent a DC cardioversion which successfully converted her to sinus rhythm. Shortly thereafter she had some SVT, but this was short lived. Her renal function remained at baseline. Her potassium and magnesium were low, so she was started on Klor-con 10 mEq daily and magnesium oxide 400 mg daily. Since going home she has felt well with no chest pain or SOB or palpitations.    Review of Systems  Constitutional: Negative.   Respiratory: Negative.    Cardiovascular: Negative.        Objective:   Physical Exam Constitutional:      Appearance: Normal appearance.     Comments: Walks with her walker   Cardiovascular:     Rate and Rhythm: Normal rate and regular rhythm.     Pulses: Normal pulses.     Heart sounds: Normal heart sounds.  Pulmonary:     Effort: Pulmonary effort is normal.     Breath sounds: Normal breath sounds.  Musculoskeletal:     Comments: 2+ edema in both lower legs   Neurological:     Mental Status: She is alert.           Assessment & Plan:  She has been successfully converted from atrial fibrillation to NSR. She had a brief run of SVT which has resolved. We will check levels of potassium and magnesium since they were low. She is scheduled to see the atrial fibrillation clinic this afternoon, but they were not aware of this appointment. She is already scheduled for an eye exam this afternoon, so they will reschedule a time to see the atrial fibrillation clinic. We spent a total of ( 32 ) minutes reviewing records and discussing these issues.  Gershon Crane, MD

## 2022-06-15 ENCOUNTER — Telehealth: Payer: Self-pay | Admitting: Family Medicine

## 2022-06-15 NOTE — Telephone Encounter (Signed)
Pt called to ask if MD thinks she should continue taking the potassium chloride (KLOR-CON M) 10 MEQ tablet ?  Pt states she does not want to keep taking meds, is she does not need to.   Please advise.

## 2022-06-16 ENCOUNTER — Encounter: Payer: Self-pay | Admitting: Family Medicine

## 2022-06-16 ENCOUNTER — Ambulatory Visit (INDEPENDENT_AMBULATORY_CARE_PROVIDER_SITE_OTHER): Payer: Medicare HMO | Admitting: Family Medicine

## 2022-06-16 VITALS — BP 104/60 | HR 43 | Temp 98.1°F | Resp 19 | Ht 67.0 in | Wt 189.5 lb

## 2022-06-16 DIAGNOSIS — R197 Diarrhea, unspecified: Secondary | ICD-10-CM

## 2022-06-16 LAB — BASIC METABOLIC PANEL
BUN: 14 mg/dL (ref 6–23)
CO2: 23 mEq/L (ref 19–32)
Calcium: 10.1 mg/dL (ref 8.4–10.5)
Chloride: 103 mEq/L (ref 96–112)
Creatinine, Ser: 0.98 mg/dL (ref 0.40–1.20)
GFR: 52.26 mL/min — ABNORMAL LOW (ref >60.00)
Glucose, Bld: 91 mg/dL (ref 70–99)
Potassium: 4.4 mEq/L (ref 3.5–5.1)
Sodium: 140 mEq/L (ref 135–145)

## 2022-06-16 LAB — CBC WITH DIFFERENTIAL/PLATELET
Basophils Absolute: 0 10*3/uL (ref 0.0–0.1)
Basophils Relative: 0.5 % (ref 0.0–3.0)
Eosinophils Absolute: 0.1 10*3/uL (ref 0.0–0.7)
Eosinophils Relative: 1.7 % (ref 0.0–5.0)
HCT: 43.7 % (ref 36.0–46.0)
Hemoglobin: 14.2 g/dL (ref 12.0–15.0)
Lymphocytes Relative: 26.9 % (ref 12.0–46.0)
Lymphs Abs: 2.2 10*3/uL (ref 0.7–4.0)
MCHC: 32.6 g/dL (ref 30.0–36.0)
MCV: 90.7 fl (ref 78.0–100.0)
Monocytes Absolute: 0.9 10*3/uL (ref 0.1–1.0)
Monocytes Relative: 11 % (ref 3.0–12.0)
Neutro Abs: 5 10*3/uL (ref 1.4–7.7)
Neutrophils Relative %: 59.9 % (ref 43.0–77.0)
Platelets: 258 10*3/uL (ref 150.0–400.0)
RBC: 4.82 Mil/uL (ref 3.87–5.11)
RDW: 17.3 % — ABNORMAL HIGH (ref 11.5–15.5)
WBC: 8.3 10*3/uL (ref 4.0–10.5)

## 2022-06-16 LAB — TSH: TSH: 0.21 u[IU]/mL — ABNORMAL LOW (ref 0.35–5.50)

## 2022-06-16 LAB — HEPATIC FUNCTION PANEL
ALT: 16 U/L (ref 0–35)
AST: 24 U/L (ref 0–37)
Albumin: 3.8 g/dL (ref 3.5–5.2)
Alkaline Phosphatase: 68 U/L (ref 39–117)
Bilirubin, Direct: 0.4 mg/dL — ABNORMAL HIGH (ref 0.0–0.3)
Total Bilirubin: 2.5 mg/dL — ABNORMAL HIGH (ref 0.2–1.2)
Total Protein: 6.5 g/dL (ref 6.0–8.3)

## 2022-06-16 MED ORDER — LOPERAMIDE HCL 2 MG PO TABS: 2.0000 mg | ORAL_TABLET | Freq: Four times a day (QID) | ORAL | 0 refills | Status: DC | PRN

## 2022-06-16 NOTE — Patient Instructions (Addendum)
Follow up as needed or as scheduled We'll notify you of your lab results and make any changes if needed Make sure you are drinking enough water to replace the fluids you are losing in your stool Take Imodium as needed to help slow stools Your last magnesium level was normal and since magnesium is a laxative, I would hold at this time Eat a bland diet- Bananas, Rice, Applesauce, Toast, Chicken.  Avoid greasy foods, fried foods, and dairy until diarrhea improves Call with any questions or concerns Hang in there!!!

## 2022-06-16 NOTE — Telephone Encounter (Signed)
FYI Pt had appointment today with Sheliah Hatch, MD for Diarrhea, pt state that she was advised to stay off Lasix for now since it can make diarrhea worse

## 2022-06-16 NOTE — Progress Notes (Signed)
   Subjective:    Patient ID: Rachael [name], female    DOB: 10-21-1933, 89 y.o.   MRN: [id number]  HPI Diarrhea- pt reports she had diarrhea that started Monday.  Yesterday had over 5 stools and they were watery.  Has already had 2 stools this morning.  No fevers.  Denies abd pain.  No N/V.  No known sick contacts.  No dietary changes.  Pt started Tikosyn ~14 days ago.  Has been seen for diarrhea prior to starting Tikosyn.  Pt reports she does not eat much- doesn't finish what she starts.  States other than the watery stools, she is feeling 'fine'.   Review of Systems For ROS see HPI     Objective:   Physical Exam Vitals reviewed.  Constitutional:      General: She is not in acute distress.    Appearance: Normal appearance. She is not ill-appearing.  HENT:     Head: Normocephalic and atraumatic.  Cardiovascular:     Pulses: Normal pulses.  Pulmonary:     Effort: Pulmonary effort is normal. No respiratory distress.  Abdominal:     General: Bowel sounds are normal. There is no distension.     Palpations: Abdomen is soft.     Tenderness: There is no abdominal tenderness. There is no guarding or rebound.  Skin:    General: Skin is warm and dry.  Neurological:     Mental Status: She is alert.  Psychiatric:        Mood and Affect: Mood normal.        Behavior: Behavior normal.           Assessment & Plan:  Diarrhea- new to provider, recurrent issue for pt.  She was seen on 5/14 by PCP w/ similar sxs.  She denies change to diet or medications w/ exception of starting Tikosyn in late May- but diarrhea was present prior to that.  No abd pain, N/V.  Stools are watery w/o blood.  She is well appearing.  No evidence of dehydration but will check electrolytes given # of stools and recent hypokalemia.  Will also check CBC to assess for infection and thyroid as possible underlying cause.  Will get stool studies as pt was recently hospitalized and could have C Diff.  Also discussed the  possibility of functional diarrhea w/ pt and son as she has been through quite a bit recently and admits to be anxious/stressed.  I told them that this would be a diagnosis of exclusion and we would first r/o other causes.  Pt to start Imodium to slow stools.  Encouraged enough fluids to replace fluid losses.  Pt expressed understanding and is in agreement w/ plan.

## 2022-06-16 NOTE — Telephone Encounter (Signed)
Yes, as long as she keeps taking the Lasix, she will need to keep taking the potassium pills

## 2022-06-17 ENCOUNTER — Ambulatory Visit: Payer: Medicare HMO

## 2022-06-17 ENCOUNTER — Telehealth: Payer: Self-pay

## 2022-06-17 ENCOUNTER — Encounter (HOSPITAL_COMMUNITY): Payer: Medicare HMO | Admitting: Internal Medicine

## 2022-06-17 NOTE — Telephone Encounter (Signed)
-----   Message from Sheliah Hatch, MD sent at 06/16/2022  3:50 PM EDT ----- Thankfully your white blood cell count is normal- no evidence of infection  Your TSH is slightly low.  Rather than adjusting your Levothyroxine at this time, I would recommend you repeat it in 3-4 weeks with Dr Clent Ridges  Your bilirubin is somewhat elevated but this is consistent w/ your readings from last May (2023)  Your potassium is normal  Overall things look good!

## 2022-06-17 NOTE — Telephone Encounter (Signed)
Left results on pt VM  

## 2022-06-18 ENCOUNTER — Ambulatory Visit: Payer: Medicare HMO | Admitting: Family Medicine

## 2022-06-18 ENCOUNTER — Ambulatory Visit: Payer: Medicare HMO | Admitting: Physician Assistant

## 2022-06-18 ENCOUNTER — Telehealth: Payer: Self-pay

## 2022-06-18 LAB — CLOSTRIDIUM DIFFICILE TOXIN B, QUALITATIVE, REAL-TIME PCR: Toxigenic C. Difficile by PCR: NOT DETECTED

## 2022-06-18 NOTE — Progress Notes (Signed)
Care Management & Coordination Services Pharmacy Team  Reason for Encounter: Medication coordination and delivery  Contacted patient to discuss medications and coordinate delivery from [facility]. Spoke with patient on 06/18/2022  Cycle dispensing form sent to [facility] for review.  Patient is due for next adherence delivery on: 06/30/22  This delivery to include: Adherence Packaging  90 Days   Atorvastatin 40 mg - Tae one tab at Bedtime Furosemide 20 mg - Take one tab at Breakfast Levothyroxine 112 mcg- Take one tab Before Breakfast Metoprolol XL 25 mg - Take one tab at Breakfast allopurinol 100 mg tablet 05/11/2022 52   atorvastatin 40 mg tablet 05/07/2022 49   Eliquis 5 mg tablet 03/16/2022 90   dofetilide (TIKOSYN) 250 MCG capsule 06/05/2022 30     Confirmed delivery date of 06/30/22, advised patient that pharmacy will contact them the morning of delivery.   Any concerns about your medications? No  How often do you forget or accidentally miss a dose? Never  Do you use a pillbox? No  Is patient in packaging Yes   Chart review: Recent office visits:  06/14/22 Nelwyn Salisbury, MD - Patient presented for Persistent atrial fibrillation and other concerns.No medication changes.  05/25/22 Nelwyn Salisbury, MD - Patient presented for Diarrhea unspecified type and other concerns. No medication changes.  05/21/22 Tillie Rung, LPN - Patient presented for [employer] Annual Wellness exam. Patient voiced goal of weight loss. No medication changes.  05/04/22 Nelwyn Salisbury, MD - Patient presented for Persistent atrial fibrillation and other concerns. Increased Oxybutynin.  04/13/22 Nelwyn Salisbury, MD - Patient presented for Acute gout of multiple sites unspecified cause and other concerns. Prescribed Allopurinol. Prescribed Allopurinol. Prescribed Solifenacin. Stopped Flecanide.   Recent consult visits:  06/16/22 Sheliah Hatch, MD - Patient presented for Diarrhea  unspecified. Prescribed Loperamide.  05/24/22 Zaldivar, Etta Quill, MD - Patient presented for Senile ectropion of lower eyelid and other concerns. No medication changes.  05/18/22 [facility] - Claims encounter for other abnormalities of gait and mobility and other concerns. No other visit details available.  05/17/22 Marinus Maw, MD (Cardiology) - Patient presented for Atrial fibrillation and other concerns. No medication changes.  05/06/22 Tessa Lerner (Cardiology) - Patient presented for Atrial fibrillation and other concerns. No medication changes.  04/19/22 Chilton Greathouse, MD (Pulmonol) - Patient presented for ILD and other concerns. No medication changes.  04/12/22 Tessa Lerner (Cardiology) - Patient presented for Atrial fibrillation and other concerns. Stopped Vitamin B-12  Hospital visits:  Medication Reconciliation was completed by comparing discharge summary, patient's EMR and Pharmacy list, and upon discussion with patient.  Patient presented to [facility] on 06/01/22 due to Persistent Atrial fibrillation. Patient was present for 4 days.  New?Medications Started at [facility] Discharge:?? -started  dofetilide Joice Lofts)  Medication Changes at Hospital Discharge: -Changed   Medications Discontinued at Hospital Discharge: -Stopped  ondansetron 4 MG disintegrating tablet (ZOFRAN-ODT)  Medications that remain the same after Hospital Discharge:??  -All other medications will remain the same.     Patient presented to [facility] on 04/28/22 due to Atrial fibrillation. Patient was present for 2 hours  New?Medications Started at [facility] Discharge:?? -started  none  Medication Changes at Hospital Discharge: -Changed  metoprolol succinate (TOPROL-XL)  Medications Discontinued at Hospital Discharge: -Stopped  none  Medications that remain the same after Hospital Discharge:??  -All other medications will remain the same.     Medications: Outpatient Encounter Medications  as of 06/15/2022  Medication Sig   albuterol (VENTOLIN HFA) 108 (90 Base) MCG/ACT inhaler Inhale 2 puffs into the lungs every 4 (four) hours as needed for wheezing or shortness of breath.   allopurinol (ZYLOPRIM) 100 MG tablet Take 1 tablet (100 mg total) by mouth daily.   apixaban (ELIQUIS) 5 MG TABS tablet Take 1 tablet (5 mg total) by mouth 2 (two) times daily.   atorvastatin (LIPITOR) 40 MG tablet Take 40 mg by mouth every evening.   Calcium Carbonate (CALCIUM 600 PO) Take 600 mg by mouth 2 (two) times daily.   Cholecalciferol 50 MCG (2000 UT) CAPS Take 2,000 Units by mouth in the morning and at bedtime.   dofetilide (TIKOSYN) 250 MCG capsule Take 1 capsule (250 mcg total) by mouth 2 (two) times daily.   furosemide (LASIX) 20 MG tablet Take 1 tablet (20 mg total) by mouth daily as needed for fluid. Take one tablet once a day   levothyroxine (SYNTHROID) 112 MCG tablet Take 1 tablet (112 mcg total) by mouth daily before breakfast.   LORazepam (ATIVAN) 0.5 MG tablet Take 0.5 mg by mouth every 8 (eight) hours as needed for anxiety.   magnesium oxide (MAG-OX) 400 MG tablet TAKE 1 TABLET BY MOUTH TWICE A DAY FOR 1 WEEK THEN REDUCE TO 1 TABLET BY MOUTH DAILY   metoprolol succinate (TOPROL-XL) 25 MG 24 hr tablet Take 1 tablet (25 mg total) by mouth daily as needed (If Bp is over 110/60). Hold if systolic blood pressure (top number) less than 100 mmHg or pulse less than 60 bpm.   Nintedanib (OFEV) 150 MG CAPS Take 1 capsule (150 mg total) by mouth 2 (two) times daily.   OVER THE COUNTER MEDICATION Take 2 capsules by mouth daily. Juice Plus [employer]   oxybutynin (DITROPAN-XL) 10 MG 24 hr tablet Take 10 mg by mouth at bedtime.   potassium chloride (KLOR-CON M) 10 MEQ tablet Take 1 tablet (10 mEq total) by mouth daily.   No facility-administered encounter medications on file as of 06/15/2022.   BP Readings from Last 3 Encounters:  06/16/22 104/60   06/14/22 (!) 98/58  06/05/22 108/88    Pulse Readings from Last 3 Encounters:  06/16/22 (!) 43  06/14/22 (!) 50  06/05/22 (!) 41    Lab Results  Component Value Date/Time   HGBA1C 5.9 11/20/2021 09:18 AM   HGBA1C 6.1 03/19/2021 09:25 AM   Lab Results  Component Value Date   CREATININE 0.98 06/16/2022   BUN 14 06/16/2022   GFR 52.26 (L) 06/16/2022   GFRNONAA 56 (L) 06/05/2022   GFRAA >60 02/26/2018   NA 140 06/16/2022   K 4.4 06/16/2022   CALCIUM 10.1 06/16/2022   CO2 23 06/16/2022      Pamala Duffel CMA Clinical Pharmacist Assistant [phone]

## 2022-06-19 LAB — STOOL CULTURE

## 2022-06-20 LAB — STOOL CULTURE

## 2022-06-21 ENCOUNTER — Encounter: Payer: Self-pay | Admitting: Family Medicine

## 2022-06-21 ENCOUNTER — Telehealth: Payer: Self-pay | Admitting: Family Medicine

## 2022-06-21 ENCOUNTER — Telehealth: Payer: Self-pay

## 2022-06-21 ENCOUNTER — Encounter (HOSPITAL_COMMUNITY): Payer: Self-pay | Admitting: Internal Medicine

## 2022-06-21 ENCOUNTER — Other Ambulatory Visit (HOSPITAL_COMMUNITY): Payer: Self-pay

## 2022-06-21 ENCOUNTER — Ambulatory Visit (HOSPITAL_COMMUNITY)
Admission: RE | Admit: 2022-06-21 | Discharge: 2022-06-21 | Disposition: A | Discharge status: Home / Self Care | Payer: Medicare HMO | Source: Ambulatory Visit | Attending: Internal Medicine | Admitting: Internal Medicine

## 2022-06-21 ENCOUNTER — Ambulatory Visit (INDEPENDENT_AMBULATORY_CARE_PROVIDER_SITE_OTHER): Payer: Medicare HMO | Admitting: Family Medicine

## 2022-06-21 VITALS — BP 120/78 | HR 58 | Ht 67.0 in | Wt 186.1 lb

## 2022-06-21 VITALS — BP 100/60 | HR 60 | Temp 97.5°F | Wt 186.0 lb

## 2022-06-21 DIAGNOSIS — E039 Hypothyroidism, unspecified: Secondary | ICD-10-CM | POA: Diagnosis not present

## 2022-06-21 DIAGNOSIS — Z79899 Other long term (current) drug therapy: Secondary | ICD-10-CM

## 2022-06-21 DIAGNOSIS — I10 Essential (primary) hypertension: Secondary | ICD-10-CM | POA: Insufficient documentation

## 2022-06-21 DIAGNOSIS — Z5181 Encounter for therapeutic drug level monitoring: Secondary | ICD-10-CM

## 2022-06-21 DIAGNOSIS — I700 Atherosclerosis of aorta: Secondary | ICD-10-CM | POA: Insufficient documentation

## 2022-06-21 DIAGNOSIS — R197 Diarrhea, unspecified: Secondary | ICD-10-CM | POA: Diagnosis not present

## 2022-06-21 DIAGNOSIS — I4819 Other persistent atrial fibrillation: Secondary | ICD-10-CM | POA: Diagnosis not present

## 2022-06-21 DIAGNOSIS — J8410 Pulmonary fibrosis, unspecified: Secondary | ICD-10-CM | POA: Insufficient documentation

## 2022-06-21 DIAGNOSIS — G4730 Sleep apnea, unspecified: Secondary | ICD-10-CM | POA: Insufficient documentation

## 2022-06-21 DIAGNOSIS — E785 Hyperlipidemia, unspecified: Secondary | ICD-10-CM | POA: Insufficient documentation

## 2022-06-21 DIAGNOSIS — R748 Abnormal levels of other serum enzymes: Secondary | ICD-10-CM | POA: Diagnosis not present

## 2022-06-21 DIAGNOSIS — D6869 Other thrombophilia: Secondary | ICD-10-CM | POA: Diagnosis not present

## 2022-06-21 LAB — HEPATIC FUNCTION PANEL
ALT: 15 U/L (ref 0–35)
AST: 26 U/L (ref 0–37)
Albumin: 3.9 g/dL (ref 3.5–5.2)
Alkaline Phosphatase: 62 U/L (ref 39–117)
Bilirubin, Direct: 0.5 mg/dL — ABNORMAL HIGH (ref 0.0–0.3)
Total Bilirubin: 2.8 mg/dL — ABNORMAL HIGH (ref 0.2–1.2)
Total Protein: 6.7 g/dL (ref 6.0–8.3)

## 2022-06-21 LAB — STOOL CULTURE: E coli, Shiga toxin Assay: NEGATIVE

## 2022-06-21 LAB — BASIC METABOLIC PANEL
BUN: 17 mg/dL (ref 6–23)
CO2: 24 mEq/L (ref 19–32)
Calcium: 10.3 mg/dL (ref 8.4–10.5)
Chloride: 105 mEq/L (ref 96–112)
Creatinine, Ser: 0.93 mg/dL (ref 0.40–1.20)
GFR: 55.65 mL/min — ABNORMAL LOW (ref >60.00)
Glucose, Bld: 84 mg/dL (ref 70–99)
Potassium: 4 mEq/L (ref 3.5–5.1)
Sodium: 142 mEq/L (ref 135–145)

## 2022-06-21 LAB — TSH: TSH: 0.12 u[IU]/mL — ABNORMAL LOW (ref 0.35–5.50)

## 2022-06-21 LAB — T4, FREE: Free T4: 2 ng/dL — ABNORMAL HIGH (ref 0.60–1.60)

## 2022-06-21 LAB — T3, FREE: T3, Free: 2.3 pg/mL (ref 2.3–4.2)

## 2022-06-21 LAB — MAGNESIUM: Magnesium: 1.5 mg/dL (ref 1.5–2.5)

## 2022-06-21 MED ORDER — DIPHENOXYLATE-ATROPINE 2.5-0.025 MG PO TABS: 2.0000 | ORAL_TABLET | Freq: Four times a day (QID) | ORAL | 0 refills | Status: DC | PRN

## 2022-06-21 NOTE — Progress Notes (Signed)
Primary Care Physician: Nelwyn Salisbury, MD Primary Cardiologist: Dr. Odis Hollingshead Primary Electrophysiologist: Dr. Ladona Ridgel Referring Physician: Dr. Markham Jordan is a 89 y.o. female with a history of SVT/AT, pulmonary fibrosis, sleep apnea, aortic atherosclerosis, HTN, HFpEF, carotid stenosis, HLD, hypothyroidism, sinus node dysfunction, and persistent atrial fibrillation who presents for consultation in the [facility]. S/p successful TEE/DCCV on 04/28/22 with ERAF. The patient was initially diagnosed with atrial fibrillation 02/2022. Patient is on Eliquis 5 mg BID for a CHADS2VASC score of 6.  On evaluation today, she is currently in rate controlled Afib. She is here for Tikosyn admission. Pharmacist review of medication list recommended patient to stop taking Zofran but patient stated she was not taking this medication. No other contraindications noted. She is not taking benadryl. She has not taken new medications prescribed since pharmacist review. She has taken her morning medications as directed. She has not been taking her Toprol over the past few days per son, due to BP 110s/60s.   She is compliant with anticoagulation and has not missed any doses. She has no bleeding concerns.  On follow up today, she is currently in NSR. She is s/p Tikosyn load admission 5/21-5/25/24. S/p successful DCCV on 06/03/22 with conversion to NSR. She is taking Tikosyn 250 mcg BID. The evening of 5/23 she was noted to have a wide complex tachycardia so Tikosyn was held and then resumed on 5/24. Discharged on K+ 10 mEq once daily. She notes issue with diarrhea since before admission and that likely exacerbated with addition of magnesium supplementation. She notes relief from diarrhea  using imodium and son stopped her magnesium several days ago. Her diarrhea appears to be improved. Review of notes today shows PCP has obtained bloodwork earlier this morning including Bmet and mag. She has  not missed any doses of Eliquis or Tikosyn.   Today, she denies symptoms of palpitations, chest pain, shortness of breath, orthopnea, PND, lower extremity edema, dizziness, presyncope, syncope, snoring, daytime somnolence, bleeding, or neurologic sequela. The patient is tolerating medications without difficulties and is otherwise without complaint today.   she has a BMI of Body mass index is 29.14 kg/m.Marland Kitchen Filed Weights   06/21/22 1531  Weight: 84.4 kg     Family History  Problem Relation Age of Onset   Uterine cancer Mother 16   Cancer Mother    Ovarian cancer Mother    Heart disease Father        before age 57   Varicose Veins Father    Heart disease Brother        before age 24   Prostate cancer Maternal Uncle        diagnosed in his 58s   Prostate cancer Paternal Uncle        diagnosed in his 8s   Clotting disorder Maternal Grandmother    Pancreatic cancer Maternal Grandfather        diagnosed in his 16s   Breast cancer Daughter 17       BRCA negative (no BART)   Cancer Daughter    Diabetes Son    Colon cancer Cousin    Leukemia Cousin    Uterine cancer Cousin    Prostate cancer Cousin    Coronary artery disease Other        family hx - female 1st degree relative <60   Hypothyroidism Other        aunt    Anesthesia problems Neg Hx  Adrenal disorder Neg Hx    Esophageal cancer Neg Hx    Stomach cancer Neg Hx     Atrial Fibrillation Management history:  Previous antiarrhythmic drugs: Tikosyn, Flecainide (discontinued due to bradycardia) Previous cardioversions: 04/28/22, 06/03/22 Previous ablations: None Anticoagulation history: Eliquis 5 mg BID   Past Medical History:  Diagnosis Date   Anxiety state 11/14/2007   Qualifier: Diagnosis of  By: Jonny Ruiz MD, Len Blalock    Breast cancer [facility])    R   CAROTID STENOSIS    Chronic venous insufficiency    CONTACT DERMATITIS    DUPUYTREN'S CONTRACTURE    Edema    pt  states had edema of feet and legs has been on lasix  per dr. to help   GLUCOSE INTOLERANCE    HLD (hyperlipidemia)    Hypothyroidism    Dr. Cherlyn Roberts in [place] endo    MENOPAUSAL SYNDROME    Obesity    Osteoarthritis    bilat hips, severe   PVD (peripheral vascular disease) (HCC)    mild carotid 11/05   VITAMIN D DEFICIENCY    Past Surgical History:  Procedure Laterality Date   2D echo  11/29/2003   normal LV size. normal EF    BACK SURGERY  12/06/2016   L3-L5 laminectomies per Dr. Bettey Mare at [facility]   BREAST BIOPSY  05/11/2011   right breast   BREAST LUMPECTOMY     right breast   CARDIOVERSION N/A 04/28/2022   Procedure: CARDIOVERSION;  Surgeon: Tessa Lerner, DO;  Location: [facility] INVASIVE CV LAB;  Service: Cardiovascular;  Laterality: N/A;   CARDIOVERSION N/A 06/03/2022   Procedure: CARDIOVERSION;  Surgeon: Jodelle Red, MD;  Location: [facility] INVASIVE CV LAB;  Service: Cardiovascular;  Laterality: N/A;   CHOLECYSTECTOMY     FINGER SURGERY     HERNIA REPAIR     incisional hernia   INGUINAL HERNIA REPAIR     JOINT REPLACEMENT Bilateral    Dr. Despina Hick   normal caronary arteries     per pt. 2003   right rotator cuff surgery     SPINE SURGERY     TEE WITHOUT CARDIOVERSION N/A 04/28/2022   Procedure: TRANSESOPHAGEAL ECHOCARDIOGRAM;  Surgeon: Tessa Lerner, DO;  Location: [facility] INVASIVE CV LAB;  Service: Cardiovascular;  Laterality: N/A;   TONSILLECTOMY     TOTAL HIP ARTHROPLASTY  05/2008 and 10/2008   B hip - alusio    Current Outpatient Medications  Medication Sig Dispense Refill   albuterol (VENTOLIN HFA) 108 (90 Base) MCG/ACT inhaler Inhale 2 puffs into the lungs every 4 (four) hours as needed for wheezing or shortness of breath. 18 g 2   allopurinol (ZYLOPRIM) 100 MG tablet Take 1 tablet (100 mg total) by mouth daily. 30 tablet 2   apixaban (ELIQUIS) 5 MG TABS tablet Take 1 tablet (5 mg total) by mouth 2 (two) times daily. 180 tablet 1   atorvastatin (LIPITOR) 40 MG tablet Take 40 mg by mouth every evening.      Calcium Carbonate (CALCIUM 600 PO) Take 600 mg by mouth 2 (two) times daily.     Cholecalciferol 50 MCG (2000 UT) CAPS Take 2,000 Units by mouth in the morning and at bedtime.     dofetilide (TIKOSYN) 250 MCG capsule Take 1 capsule (250 mcg total) by mouth 2 (two) times daily. 60 capsule 11   furosemide (LASIX) 20 MG tablet Take 1 tablet (20 mg total) by mouth daily as needed for fluid. Take one tablet once a day  90 tablet 3   levothyroxine (SYNTHROID) 112 MCG tablet Take 1 tablet (112 mcg total) by mouth daily before breakfast. 90 tablet 3   LORazepam (ATIVAN) 0.5 MG tablet Take 0.5 mg by mouth every 8 (eight) hours as needed for anxiety.     metoprolol succinate (TOPROL-XL) 25 MG 24 hr tablet Take 1 tablet (25 mg total) by mouth daily as needed (If Bp is over 110/60). Hold if systolic blood pressure (top number) less than 100 mmHg or pulse less than 60 bpm.     Nintedanib (OFEV) 150 MG CAPS Take 1 capsule (150 mg total) by mouth 2 (two) times daily. 180 capsule 1   OVER THE COUNTER MEDICATION Take 2 capsules by mouth daily. Juice Plus Berry Blend     oxybutynin (DITROPAN-XL) 10 MG 24 hr tablet Take 10 mg by mouth at bedtime.     potassium chloride (KLOR-CON M) 10 MEQ tablet Take 1 tablet (10 mEq total) by mouth daily. 90 tablet 3   diphenoxylate-atropine (LOMOTIL) 2.5-0.025 MG tablet Take 2 tablets by mouth 4 (four) times daily as needed for diarrhea or loose stools. (Patient not taking: Reported on 06/21/2022) 120 tablet 0   magnesium oxide (MAG-OX) 400 MG tablet TAKE 1 TABLET BY MOUTH TWICE A DAY FOR 1 WEEK THEN REDUCE TO 1 TABLET BY MOUTH DAILY (Patient not taking: Reported on 06/21/2022) 37 tablet 11   No current facility-administered medications for this encounter.    Allergies  Allergen Reactions   Shellfish Allergy Anaphylaxis and Hives    Hives all over the body.    Percocet [Oxycodone-Acetaminophen] Hives   Iodinated Contrast Media Other (See Comments)    PT IS NOT AWARE OF  IODINE ALLERGY, PREMEDICATED FOR PRIOR PREMEDS ONLY//A.C. - unknown reaction   Iodine Hives    Social History   Socioeconomic History   Marital status: Divorced    Spouse name: Not on file   Number of children: 5   Years of education: 1 year college   Highest education level: High school graduate  Occupational History   Not on file  Tobacco Use   Smoking status: Never    Passive exposure: Past   Smokeless tobacco: Never  Vaping Use   Vaping Use: Never used  Substance and Sexual Activity   Alcohol use: No    Alcohol/week: 0.0 standard drinks of alcohol   Drug use: No   Sexual activity: Not Currently  Other Topics Concern   Not on file  Social History Narrative   Divorced; works part time   5 children      Son "Rory" or daughter "Delorise Jackson" accompanies patient to appts   Social Determinants of Health   Financial Resource Strain: Low Risk  (05/21/2022)   Overall Financial Resource Strain (CARDIA)    Difficulty of Paying Living Expenses: Not hard at all  Food Insecurity: No Food Insecurity (06/08/2022)   Hunger Vital Sign    Worried About Running Out of Food in the Last Year: Never true    Ran Out of Food in the Last Year: Never true  Transportation Needs: No Transportation Needs (06/08/2022)   PRAPARE - Administrator, Civil Service (Medical): No    Lack of Transportation (Non-Medical): No  Physical Activity: Sufficiently Active (05/21/2022)   Exercise Vital Sign    Days of Exercise per Week: 6 days    Minutes of Exercise per Session: 30 min  Stress: No Stress Concern Present (05/21/2022)   [employer] of Occupational Health -  Occupational Stress Questionnaire    Feeling of Stress : Not at all  Social Connections: Moderately Integrated (05/21/2022)   Social Connection and Isolation Panel [NHANES]    Frequency of Communication with Friends and Family: More than three times a week    Frequency of Social Gatherings with Friends and Family: More than three times  a week    Attends Religious Services: More than 4 times per year    Active Member of [employer] or Organizations: Yes    Attends Engineer, structural: More than 4 times per year    Marital Status: Divorced  Intimate Partner Violence: Not At Risk (05/21/2022)   Humiliation, Afraid, Rape, and Kick questionnaire    Fear of Current or Ex-Partner: No    Emotionally Abused: No    Physically Abused: No    Sexually Abused: No     ROS- All systems are reviewed and negative except as per the HPI above.  Physical Exam: Vitals:   06/21/22 1531  BP: 120/78  Pulse: (!) 58  Weight: 84.4 kg  Height: 5\' 7"  (1.702 m)    GEN- The patient is well appearing, alert and oriented x 3 today.   Head- normocephalic, atraumatic Eyes-  Sclera clear, conjunctiva pink Ears- hearing intact Lungs- Clear to ausculation bilaterally, normal work of breathing Heart- Regular rate and rhythm, no murmurs, rubs or gallops, PMI not laterally displaced Extremities- no clubbing, cyanosis, or edema MS- no significant deformity or atrophy Skin- no rash or lesion Psych- euthymic mood, full affect Neuro- strength and sensation are intact   Wt Readings from Last 3 Encounters:  06/21/22 84.4 kg  06/21/22 84.4 kg  06/16/22 86 kg    EKG today demonstrates  Vent. rate 58 BPM PR interval 204 ms QRS duration 86 ms QT/QTcB 430/422 ms P-R-T axes 51 105 -30 Sinus bradycardia Rightward axis Low voltage QRS Nonspecific T wave abnormality Abnormal ECG When compared with ECG of 05-Jun-2022 00:18, PREVIOUS ECG IS PRESENT  Echo TEE 04/28/22 demonstrated:  1. Left ventricular ejection fraction, by estimation, is 55 to 60%. The  left ventricle has normal function. The left ventricle has no regional  wall motion abnormalities.   2. Right ventricular systolic function is normal. The right ventricular  size is normal.   3. No left atrial/left atrial appendage thrombus was detected. The LAA  emptying velocity was 30  cm/s.   4. The mitral valve is normal in structure. Mild mitral valve  regurgitation. No evidence of mitral stenosis.   5. Tricuspid valve regurgitation is mild to moderate.   6. The aortic valve is tricuspid. Aortic valve regurgitation is not  visualized. Aortic valve sclerosis is present, with no evidence of aortic  valve stenosis.   7. The inferior vena cava is normal in size with greater than 50%  respiratory variability, suggesting right atrial pressure of 3 mmHg.   Conclusion(s)/Recommendation(s): No LA/LAA thrombus identified. Successful  cardioversion performed with restoration of normal sinus rhythm.   Epic records are reviewed at length today.  CHA2DS2-VASc Score = 6  The patient's score is based upon: CHF History: 1 HTN History: 1 Diabetes History: 0 Stroke History: 0 Vascular Disease History: 1 Age Score: 2 Gender Score: 1      ASSESSMENT AND PLAN: Persistent Atrial Fibrillation (ICD10:  I48.19) The patient's CHA2DS2-VASc score is 6, indicating a 9.7% annual risk of stroke.   S/p Tikosyn admission 5/21-25/24. S/p DCCV on 5/23 with successful conversion to NSR.   She is currently in  NSR.    Qtc stable. Labs drawn today by PCP which include a Bmet and mag level. She is still taking potassium supplementation but stopped magnesium supplementation due to worsening diarrhea.  Continue Tikosyn 250 mcg BID.    Secondary Hypercoagulable State (ICD10:  D68.69) The patient is at significant risk for stroke/thromboembolism based upon her CHA2DS2-VASc Score of 6.  Continue Apixaban (Eliquis).   No missed doses.     F/u 1 month for Tikosyn surveillance.    Lake Bells, PA-C Afib Clinic [name] [address] [place], [place] [address] [phone] 06/21/2022 3:58 PM

## 2022-06-21 NOTE — Telephone Encounter (Signed)
Left vm with lab results.  

## 2022-06-21 NOTE — Progress Notes (Signed)
   Subjective:    Patient ID: Rachael [name], female    DOB: Feb 23, 1933, 89 y.o.   MRN: [id number]  HPI Here with her son to follow up on diarrhea. She began to pass watery stools about 3 weeks ago. She has some mild cramping but no significant pain. No fever or nausea. She has tired Imodium but this has not helped much. She saw Dr. Beverely Low on 06-16-22, and she ordered labs which showed a normal potassium of 4.4, a normal Mg of 1.9, and normal WBC of 8.3, and a slightly low TSH at 0.21. Total bilirubin was slightly elevated to 2.5. Stool cultures and a C diff assay were negative. She was started on magnesium supplements during her last hospital stay, but she stopped taking these 5 days ago.    Review of Systems  Constitutional: Negative.   Respiratory: Negative.    Cardiovascular: Negative.   Gastrointestinal:  Positive for diarrhea. Negative for abdominal distention, abdominal pain, blood in stool, constipation, nausea, rectal pain and vomiting.  Genitourinary: Negative.        Objective:   Physical Exam Constitutional:      Appearance: Normal appearance. She is not ill-appearing.  Cardiovascular:     Rate and Rhythm: Normal rate and regular rhythm.  Neurological:     Mental Status: She is alert.           Assessment & Plan:  She has been having diarrhea which does not appear to be infectious. I think the most likely etiology is the magnesium she had been taking. We agreed that she will stay off this. Recheck labs today including a full thyroid panel. She will try Lomotil as needed for the diarrhea.  Gershon Crane, MD

## 2022-06-21 NOTE — Telephone Encounter (Signed)
Ran test claim, received paid claim. Called pharmacy, they were able to process through insurance. Out of stock, pharmacy ordering tonight, should be in tomorrow.

## 2022-06-21 NOTE — Telephone Encounter (Signed)
Pt is calling and diphenoxylate-atropine (LOMOTIL) 2.5-0.025 MG tablet  need PA

## 2022-06-21 NOTE — Telephone Encounter (Signed)
-----   Message from Sheliah Hatch, MD sent at 06/21/2022  7:51 AM EDT ----- No evidence of infection in stool.  This is good news!

## 2022-06-22 ENCOUNTER — Telehealth: Payer: Self-pay

## 2022-06-22 MED ORDER — LEVOTHYROXINE SODIUM 100 MCG PO TABS: 100.0000 ug | ORAL_TABLET | Freq: Every day | ORAL | 3 refills | Status: DC

## 2022-06-22 NOTE — Progress Notes (Deleted)
Patient ID: Rachael [name], female   DOB: 1933-04-01, 89 y.o.   MRN: [id number]  Care Management & Coordination Services Pharmacy Team  Reason for Encounter: Hypertension  Contacted patient to discuss hypertension disease state. {US HC Outreach:28874}    Current antihypertensive regimen:  ***  Patient verbally confirms she is taking the above medications as directed. {yes/no:20286}  How often are you checking your Blood Pressure? {CHL HP BP Monitoring Frequency:[phone]}  she checks her blood pressure {timing:25218} {before/after:25217} taking her medication.  Current home BP readings: ***  DATE:             BP               PULSE   Wrist or arm cuff: Caffeine intake: Salt intake: OTC medications including pseudoephedrine or NSAIDs?  Any readings above 180/100? {yes/no:20286} If yes any symptoms of hypertensive emergency? {hypertensive emergency symptoms:25354}  What recent interventions/DTPs have been made by any provider to improve Blood Pressure control since last CPP Visit: ***  Any recent hospitalizations or ED visits since last visit with CPP? {yes/no:20286}  What diet changes have been made to improve Blood Pressure Control?  ***  What exercise is being done to improve your Blood Pressure Control?  ***  Adherence Review: Is the patient currently on ACE/ARB medication? No Does the patient have >5 day gap between last estimated fill dates? No  Star Rating Drugs:  Atorvastatin 40 mg -  To be delivered on 06/30/22 90 DS Upstream  Angela Follow up July  Chart Updates: Recent office visits:  06/21/22 Nelwyn Salisbury, MD - Patient presented for Diarrhea unspecified type and other concerns. Prescribed Lomotil. Stopped Loperamide  Recent consult visits:  06/21/22 Eustace Pen, PA-C (Cardiology) - Patient presented for Persistent atrial fibrillation and other concerns. No medication changes.  Hospital visits:  Patient presented to [facility]  on 06/01/22 due to Persistent Atrial fibrillation. Patient was present for 4 days.   New?Medications Started at [facility] Discharge:?? -started  dofetilide Joice Lofts)   Medication Changes at Hospital Discharge: -Changed    Medications Discontinued at Hospital Discharge: -Stopped  ondansetron 4 MG disintegrating tablet (ZOFRAN-ODT)   Medications that remain the same after Hospital Discharge:??  -All other medications will remain the same.       Patient presented to [facility] on 04/28/22 due to Atrial fibrillation. Patient was present for 2 hours   New?Medications Started at [facility] Discharge:?? -started  none   Medication Changes at Hospital Discharge: -Changed  metoprolol succinate (TOPROL-XL)   Medications Discontinued at Hospital Discharge: -Stopped  none   Medications that remain the same after Hospital Discharge:??  -All other medications will remain the same.      Medications: Outpatient Encounter Medications as of 06/22/2022  Medication Sig Note   albuterol (VENTOLIN HFA) 108 (90 Base) MCG/ACT inhaler Inhale 2 puffs into the lungs every 4 (four) hours as needed for wheezing or shortness of breath.    allopurinol (ZYLOPRIM) 100 MG tablet Take 1 tablet (100 mg total) by mouth daily.    apixaban (ELIQUIS) 5 MG TABS tablet Take 1 tablet (5 mg total) by mouth 2 (two) times daily.    atorvastatin (LIPITOR) 40 MG tablet Take 40 mg by mouth every evening.    Calcium Carbonate (CALCIUM 600 PO) Take 600 mg by mouth 2 (two) times daily.    Cholecalciferol 50 MCG (2000 UT) CAPS Take 2,000 Units by mouth in the morning and at bedtime.  diphenoxylate-atropine (LOMOTIL) 2.5-0.025 MG tablet Take 2 tablets by mouth 4 (four) times daily as needed for diarrhea or loose stools. (Patient not taking: Reported on 06/21/2022)    dofetilide (TIKOSYN) 250 MCG capsule Take 1 capsule (250 mcg total) by mouth 2 (two) times daily.    furosemide (LASIX) 20 MG tablet Take 1 tablet  (20 mg total) by mouth daily as needed for fluid. Take one tablet once a day    levothyroxine (SYNTHROID) 112 MCG tablet Take 1 tablet (112 mcg total) by mouth daily before breakfast.    LORazepam (ATIVAN) 0.5 MG tablet Take 0.5 mg by mouth every 8 (eight) hours as needed for anxiety.    magnesium oxide (MAG-OX) 400 MG tablet TAKE 1 TABLET BY MOUTH TWICE A DAY FOR 1 WEEK THEN REDUCE TO 1 TABLET BY MOUTH DAILY (Patient not taking: Reported on 06/21/2022) 06/21/2022: On hold    metoprolol succinate (TOPROL-XL) 25 MG 24 hr tablet Take 1 tablet (25 mg total) by mouth daily as needed (If Bp is over 110/60). Hold if systolic blood pressure (top number) less than 100 mmHg or pulse less than 60 bpm.    Nintedanib (OFEV) 150 MG CAPS Take 1 capsule (150 mg total) by mouth 2 (two) times daily.    OVER THE COUNTER MEDICATION Take 2 capsules by mouth daily. Juice Plus Berry Blend    oxybutynin (DITROPAN-XL) 10 MG 24 hr tablet Take 10 mg by mouth at bedtime.    potassium chloride (KLOR-CON M) 10 MEQ tablet Take 1 tablet (10 mEq total) by mouth daily.    No facility-administered encounter medications on file as of 06/22/2022.    Recent Office Vitals: BP Readings from Last 3 Encounters:  06/21/22 120/78  06/21/22 100/60  06/16/22 104/60   Pulse Readings from Last 3 Encounters:  06/21/22 (!) 58  06/21/22 60  06/16/22 (!) 43    Wt Readings from Last 3 Encounters:  06/21/22 186 lb 1.1 oz (84.4 kg)  06/21/22 186 lb (84.4 kg)  06/16/22 189 lb 8 oz (86 kg)     Kidney Function Lab Results  Component Value Date/Time   CREATININE 0.93 06/21/2022 10:38 AM   CREATININE 0.98 06/16/2022 10:44 AM   CREATININE 1.21 (H) 12/26/2020 03:11 PM   CREATININE 0.8 10/15/2015 09:43 AM   CREATININE 0.8 04/15/2015 09:19 AM   GFR 55.65 (L) 06/21/2022 10:38 AM   GFRNONAA 56 (L) 06/05/2022 01:57 AM   GFRAA >60 02/26/2018 07:13 AM       Latest Ref Rng & Units 06/21/2022   10:38 AM 06/16/2022   10:44 AM 06/14/2022   11:55 AM   BMP  Glucose 70 - 99 mg/dL 84  91  89   BUN 6 - 23 mg/dL 17  14  17    Creatinine 0.40 - 1.20 mg/dL 1.61  0.96  0.45   Sodium 135 - 145 mEq/L 142  140  140   Potassium 3.5 - 5.1 mEq/L 4.0  4.4  5.0   Chloride 96 - 112 mEq/L 105  103  104   CO2 19 - 32 mEq/L 24  23  26    Calcium 8.4 - 10.5 mg/dL 40.9  81.1  9.9        Pamala Duffel CMA Clinical Pharmacist Assistant [phone]

## 2022-06-22 NOTE — Addendum Note (Signed)
Addended by: Johnella Moloney on: 06/22/2022 12:35 PM   Modules accepted: Orders

## 2022-06-22 NOTE — Telephone Encounter (Signed)
Noted  

## 2022-06-23 ENCOUNTER — Other Ambulatory Visit: Payer: Self-pay | Admitting: Family Medicine

## 2022-06-23 ENCOUNTER — Telehealth: Payer: Self-pay | Admitting: Pulmonary Disease

## 2022-06-23 NOTE — Telephone Encounter (Signed)
PT calling saying she has had diarrhea since May 15th and she believes it is the OFEV.   Her PCP told her to go off her Magnesium but when she did that did not help. She seeks advise on this from Dr. Isaiah Serge. Her number is [phone]  Over the counter Imodium  meds do not help

## 2022-06-24 NOTE — Telephone Encounter (Signed)
LOV as 06/21/22 Last refill done on 04/14/22 Please advise

## 2022-06-25 NOTE — Telephone Encounter (Signed)
Yes. Please hold the ofev and make a office visit.

## 2022-06-28 NOTE — Telephone Encounter (Signed)
Called and spoke with patient.  Dr. Shirlee More recommendations given.  Understanding stated.  Patient scheduled 07/21/22 at 1115 with Dr. Isaiah Serge. Nothing further at this time.

## 2022-06-29 DIAGNOSIS — R2689 Other abnormalities of gait and mobility: Secondary | ICD-10-CM | POA: Diagnosis not present

## 2022-06-29 DIAGNOSIS — R531 Weakness: Secondary | ICD-10-CM | POA: Diagnosis not present

## 2022-06-29 DIAGNOSIS — R2681 Unsteadiness on feet: Secondary | ICD-10-CM | POA: Diagnosis not present

## 2022-06-30 ENCOUNTER — Ambulatory Visit: Payer: Medicare HMO | Attending: Internal Medicine

## 2022-06-30 DIAGNOSIS — I4819 Other persistent atrial fibrillation: Secondary | ICD-10-CM | POA: Diagnosis not present

## 2022-06-30 DIAGNOSIS — Z79899 Other long term (current) drug therapy: Secondary | ICD-10-CM

## 2022-06-30 DIAGNOSIS — I5031 Acute diastolic (congestive) heart failure: Secondary | ICD-10-CM

## 2022-06-30 DIAGNOSIS — I5032 Chronic diastolic (congestive) heart failure: Secondary | ICD-10-CM | POA: Diagnosis not present

## 2022-06-30 LAB — BASIC METABOLIC PANEL
BUN/Creatinine Ratio: 15 (ref 12–28)
BUN: 14 mg/dL (ref 8–27)
CO2: 24 mmol/L (ref 20–29)
Calcium: 10 mg/dL (ref 8.7–10.3)
Chloride: 104 mmol/L (ref 96–106)
Creatinine, Ser: 0.95 mg/dL (ref 0.57–1.00)
Glucose: 95 mg/dL (ref 70–99)
Potassium: 4.2 mmol/L (ref 3.5–5.2)
Sodium: 143 mmol/L (ref 134–144)
eGFR: 58 mL/min/{1.73_m2} — ABNORMAL LOW (ref >59)

## 2022-06-30 LAB — MAGNESIUM: Magnesium: 1.5 mg/dL — ABNORMAL LOW (ref 1.6–2.3)

## 2022-07-02 ENCOUNTER — Telehealth: Payer: Self-pay | Admitting: *Deleted

## 2022-07-02 ENCOUNTER — Other Ambulatory Visit: Payer: Self-pay

## 2022-07-02 ENCOUNTER — Other Ambulatory Visit (HOSPITAL_COMMUNITY): Payer: Self-pay | Admitting: *Deleted

## 2022-07-02 DIAGNOSIS — Z79899 Other long term (current) drug therapy: Secondary | ICD-10-CM

## 2022-07-02 MED ORDER — POTASSIUM CHLORIDE CRYS ER 10 MEQ PO TBCR: 10.0000 meq | EXTENDED_RELEASE_TABLET | Freq: Every day | ORAL | 3 refills | Status: DC

## 2022-07-02 MED ORDER — MAGNESIUM OXIDE 400 MG PO TABS: ORAL_TABLET | ORAL | 11 refills | Status: DC

## 2022-07-02 MED ORDER — DOFETILIDE 250 MCG PO CAPS: 250.0000 ug | ORAL_CAPSULE | Freq: Two times a day (BID) | ORAL | 1 refills | Status: DC

## 2022-07-02 NOTE — Addendum Note (Signed)
Addended by: Burnetta Sabin on: 07/02/2022 09:22 AM   Modules accepted: Orders

## 2022-07-02 NOTE — Telephone Encounter (Signed)
This is a A-Fib clinic pt Pt's son is requesting medication today be sent, because will be out of medication this weekend. Please address

## 2022-07-02 NOTE — Telephone Encounter (Signed)
-----   Message from Loa Socks, LPN sent at 1/61/0960  8:26 AM EDT -----  ----- Message ----- From: Kennon Rounds Sent: 07/01/2022   7:21 AM EDT To: Eustace Pen, PA-C; Cv Div Ch St Triage  Will send to Lake Bells, PA-C w AFib Clinic as FYI Creatinine, K+ normal. Mg2+ is too low. She is on Tikosyn and needs a Mg2+ of 2. Notes from AFib clinic indicate she stopped Mg2+ b/c of diarrhea. PLAN:  -If diarrhea was with Mag Ox 400 mg twice daily >> Take 400 mg once daily  -If diarrhea occurred with Mag Ox 400 mg daily >> Start Slow Mag 1 tablet daily -Repeat Magnesium level 2 weeks Tereso Newcomer, PA-C    07/01/2022 7:16 AM

## 2022-07-05 ENCOUNTER — Other Ambulatory Visit: Payer: Self-pay | Admitting: Family Medicine

## 2022-07-06 DIAGNOSIS — R2681 Unsteadiness on feet: Secondary | ICD-10-CM | POA: Diagnosis not present

## 2022-07-06 DIAGNOSIS — R2689 Other abnormalities of gait and mobility: Secondary | ICD-10-CM | POA: Diagnosis not present

## 2022-07-06 DIAGNOSIS — R531 Weakness: Secondary | ICD-10-CM | POA: Diagnosis not present

## 2022-07-13 ENCOUNTER — Ambulatory Visit: Payer: Medicare HMO | Attending: Physician Assistant

## 2022-07-13 DIAGNOSIS — R531 Weakness: Secondary | ICD-10-CM | POA: Diagnosis not present

## 2022-07-13 DIAGNOSIS — Z79899 Other long term (current) drug therapy: Secondary | ICD-10-CM

## 2022-07-13 DIAGNOSIS — R2689 Other abnormalities of gait and mobility: Secondary | ICD-10-CM | POA: Diagnosis not present

## 2022-07-13 DIAGNOSIS — R2681 Unsteadiness on feet: Secondary | ICD-10-CM | POA: Diagnosis not present

## 2022-07-14 ENCOUNTER — Ambulatory Visit: Payer: Medicare HMO

## 2022-07-14 ENCOUNTER — Ambulatory Visit (INDEPENDENT_AMBULATORY_CARE_PROVIDER_SITE_OTHER): Payer: Medicare HMO | Admitting: Family Medicine

## 2022-07-14 ENCOUNTER — Telehealth: Payer: Self-pay | Admitting: *Deleted

## 2022-07-14 ENCOUNTER — Encounter: Payer: Self-pay | Admitting: Family Medicine

## 2022-07-14 VITALS — BP 98/58 | HR 56 | Temp 98.0°F | Wt 185.0 lb

## 2022-07-14 DIAGNOSIS — Z79899 Other long term (current) drug therapy: Secondary | ICD-10-CM

## 2022-07-14 DIAGNOSIS — J3089 Other allergic rhinitis: Secondary | ICD-10-CM

## 2022-07-14 DIAGNOSIS — I82812 Embolism and thrombosis of superficial veins of left lower extremities: Secondary | ICD-10-CM

## 2022-07-14 DIAGNOSIS — R3989 Other symptoms and signs involving the genitourinary system: Secondary | ICD-10-CM | POA: Diagnosis not present

## 2022-07-14 LAB — MAGNESIUM: Magnesium: 1.9 mg/dL (ref 1.6–2.3)

## 2022-07-14 NOTE — Progress Notes (Signed)
   Subjective:    Patient ID: Rachael [name], female    DOB: 10-19-1933, 89 y.o.   MRN: [id number]  HPI    Review of Systems     Objective:   Physical Exam        Assessment & Plan:  For the yellow urine, we will get a UA. The cough and hoarseness are likely due to allergies, so she will try taking Zyrtec daily. Finally she seems to have a superficial thrombus in the left lower leg vein despite the fact that she is taking Eliquis. She has an appt at the [facility] on 07-27-22, and she plans to get an US of the leg that day.  Gershon Crane, MD

## 2022-07-14 NOTE — Telephone Encounter (Signed)
-----   Message from Beatrice Lecher, [place] sent at 07/14/2022  1:01 PM EDT ----- Results sent to [name] via MyChart. See MyChart comments below. PLAN:  -Repeat Magnesium in 1 month.  Ms. Oakley  Your Magnesium is much improved. Continue current medications. I will recheck this again in 1 month.  Tereso Newcomer, PA-C

## 2022-07-14 NOTE — Progress Notes (Signed)
   Subjective:    Patient ID: Rachael [name], female    DOB: [birth year], 89 y.o.   MRN: [id number]  HPI Here for several issues. First about a week ago she noticed that her urine has a bright yellow color, which is unusual. No urgency or burning. Also she has had a hoarse voice and a cough that sometimes produces clear sputum for the past month or so. No SOB or fever. Finally she developed pain in a vein in her left lower leg about 3 days ago. No recent trauma.    Review of Systems  Constitutional: Negative.   HENT:  Positive for voice change. Negative for postnasal drip, sinus pressure, sore throat and trouble swallowing.   Respiratory:  Positive for cough. Negative for shortness of breath and wheezing.   Cardiovascular:  Positive for leg swelling. Negative for chest pain and palpitations.  Gastrointestinal: Negative.   Genitourinary:  Negative for dysuria, flank pain, hematuria and urgency.       Objective:   Physical Exam Constitutional:      Appearance: Normal appearance. She is not ill-appearing.     Comments: Her voice is somewhat hoarse   Cardiovascular:     Rate and Rhythm: Normal rate. Rhythm irregular.     Pulses: Normal pulses.     Heart sounds: Normal heart sounds.  Pulmonary:     Effort: Pulmonary effort is normal.     Breath sounds: Normal breath sounds.  Musculoskeletal:     Comments: There is a tender firm cord along a superficial vein in the left lateral lower leg. This is about 5 cm long.   Neurological:     Mental Status: She is alert.          Assessment & Plan:

## 2022-07-16 ENCOUNTER — Ambulatory Visit: Payer: Medicare HMO

## 2022-07-19 ENCOUNTER — Telehealth: Payer: Self-pay | Admitting: Family Medicine

## 2022-07-19 NOTE — Telephone Encounter (Signed)
Pt son is calling to report his mother passed away yesterday

## 2022-07-21 ENCOUNTER — Ambulatory Visit: Payer: Medicare HMO | Admitting: Pulmonary Disease

## 2022-07-22 ENCOUNTER — Ambulatory Visit (HOSPITAL_COMMUNITY): Payer: Medicare HMO | Admitting: Internal Medicine

## 2022-08-12 DEATH — deceased

## 2022-08-13 ENCOUNTER — Other Ambulatory Visit: Payer: Medicare HMO

## 2022-09-27 ENCOUNTER — Other Ambulatory Visit: Payer: Medicare HMO

## 2022-11-05 ENCOUNTER — Ambulatory Visit: Payer: Medicare HMO | Admitting: Cardiology

## 2023-06-07 ENCOUNTER — Other Ambulatory Visit (HOSPITAL_COMMUNITY): Payer: Self-pay

## 2023-11-11 NOTE — Chronic Care Management (AMB) (Signed)
 A user error has taken place: encounter opened in error, closed for administrative reasons.
